# Patient Record
Sex: Female | Born: 1985 | Race: Black or African American | Hispanic: No | Marital: Single | Smoking: Current every day smoker
Health system: Southern US, Community
[De-identification: ages and names within clinical notes are randomized; demographics above are authoritative.]

## PROBLEM LIST (undated history)

## (undated) ENCOUNTER — Inpatient Hospital Stay (HOSPITAL_COMMUNITY): Payer: Self-pay

## (undated) DIAGNOSIS — D279 Benign neoplasm of unspecified ovary: Secondary | ICD-10-CM

## (undated) DIAGNOSIS — I10 Essential (primary) hypertension: Secondary | ICD-10-CM

## (undated) DIAGNOSIS — M546 Pain in thoracic spine: Secondary | ICD-10-CM

## (undated) DIAGNOSIS — J302 Other seasonal allergic rhinitis: Secondary | ICD-10-CM

## (undated) DIAGNOSIS — J45909 Unspecified asthma, uncomplicated: Secondary | ICD-10-CM

## (undated) DIAGNOSIS — E739 Lactose intolerance, unspecified: Secondary | ICD-10-CM

## (undated) DIAGNOSIS — R4586 Emotional lability: Secondary | ICD-10-CM

## (undated) DIAGNOSIS — L309 Dermatitis, unspecified: Secondary | ICD-10-CM

## (undated) DIAGNOSIS — R569 Unspecified convulsions: Secondary | ICD-10-CM

## (undated) DIAGNOSIS — I639 Cerebral infarction, unspecified: Secondary | ICD-10-CM

## (undated) HISTORY — DX: Pain in thoracic spine: M54.6

## (undated) HISTORY — PX: WISDOM TOOTH EXTRACTION: SHX21

## (undated) HISTORY — DX: Emotional lability: R45.86

## (undated) HISTORY — DX: Unspecified convulsions: R56.9

## (undated) HISTORY — DX: Benign neoplasm of unspecified ovary: D27.9

---
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
## 2012-04-07 ENCOUNTER — Encounter (HOSPITAL_COMMUNITY): Payer: Self-pay | Admitting: Emergency Medicine

## 2012-04-07 ENCOUNTER — Emergency Department (HOSPITAL_COMMUNITY)
Admission: EM | Admit: 2012-04-07 | Discharge: 2012-04-07 | Disposition: A | Discharge status: Home / Self Care | Payer: Self-pay | Attending: Emergency Medicine | Admitting: Emergency Medicine

## 2012-04-07 DIAGNOSIS — I10 Essential (primary) hypertension: Secondary | ICD-10-CM | POA: Insufficient documentation

## 2012-04-07 DIAGNOSIS — L309 Dermatitis, unspecified: Secondary | ICD-10-CM

## 2012-04-07 DIAGNOSIS — J309 Allergic rhinitis, unspecified: Secondary | ICD-10-CM | POA: Insufficient documentation

## 2012-04-07 DIAGNOSIS — J302 Other seasonal allergic rhinitis: Secondary | ICD-10-CM

## 2012-04-07 DIAGNOSIS — L259 Unspecified contact dermatitis, unspecified cause: Secondary | ICD-10-CM | POA: Insufficient documentation

## 2012-04-07 DIAGNOSIS — F17200 Nicotine dependence, unspecified, uncomplicated: Secondary | ICD-10-CM | POA: Insufficient documentation

## 2012-04-07 HISTORY — DX: Essential (primary) hypertension: I10

## 2012-04-07 HISTORY — DX: Dermatitis, unspecified: L30.9

## 2012-04-07 MED ORDER — OXYCODONE-ACETAMINOPHEN 5-325 MG PO TABS
1.0000 | ORAL_TABLET | Freq: Once | ORAL | Status: AC
  Administered 2012-04-07: 1 via ORAL
  Filled 2012-04-07: qty 1

## 2012-04-07 MED ORDER — OXYCODONE-ACETAMINOPHEN 5-325 MG PO TABS: 1.0000 | ORAL_TABLET | ORAL | Status: DC | PRN

## 2012-04-07 MED ORDER — PREDNISONE 20 MG PO TABS: 60.0000 mg | ORAL_TABLET | Freq: Every day | ORAL | Status: DC

## 2012-04-07 MED ORDER — PREDNISONE 20 MG PO TABS
60.0000 mg | ORAL_TABLET | Freq: Once | ORAL | Status: AC
  Administered 2012-04-07: 60 mg via ORAL
  Filled 2012-04-07: qty 3

## 2012-04-07 NOTE — ED Provider Notes (Signed)
History   This chart was scribed for Dione Booze, MD by Sofie Rower. The patient was seen in room TR04C/TR04C and the patient's care was started at 11:31AM.     CSN: [id number]  Arrival date & time 04/07/12  1018   First MD Initiated Contact with Patient 04/07/12 1131      Chief Complaint  Patient presents with  . Eczema    (Consider location/radiation/quality/duration/timing/severity/associated sxs/prior treatment) Patient is a 26 y.o. female presenting with rash. The history is provided by the patient. No language interpreter was used.  Rash  This is a new problem. The current episode started more than 2 days ago. The problem has been gradually worsening. The problem is associated with an unknown factor. There has been no fever. The rash is present on the trunk, face, right hand and left hand. The pain is moderate. The pain has been constant since onset. Associated symptoms include pain. She has tried nothing for the symptoms. The treatment provided no relief.    [name] [name] is a 26 y.o. female , with a hx of eczema, who presents to the Emergency Department complaining of sudden, progressively worsening, eczema, located at the hands bilaterally, face, and trunk, onset one week ago.  Associated symptoms include nonproductive cough. The pt reports the pain she is experiencing with the eczema is rated at a 7/10 at present. In addition, the pt informs that her previous episodes of eczema have been triggered by the change in the weather, transitioning from warm to cool temperatures.   The pt is a current everyday smoker (0.3 packs/day), in addition to drinking alcohol on occasion.   Pt does not have a PCP.    Past Medical History  Diagnosis Date  . Eczema   . Hypertension     History reviewed. No pertinent past surgical history.  No family history on file.  History  Substance Use Topics  . Smoking status: Current Every Day Smoker  . Smokeless tobacco: Not on file  . Alcohol  Use: Yes     occasional    OB History    Grav Para Term Preterm Abortions TAB SAB Ect Mult Living                  Review of Systems  Skin: Positive for rash.  All other systems reviewed and are negative.    Allergies  Sulfa antibiotics  Home Medications   Current Outpatient Rx  Name Route Sig Dispense Refill  . DIPHENHYDRAMINE HCL (SLEEP) 25 MG PO TABS Oral Take 25 mg by mouth every 4 (four) hours as needed. Itching, allergies    . IBUPROFEN 200 MG PO TABS Oral Take 200 mg by mouth every 6 (six) hours as needed. headache      BP 153/111  Pulse 72  Temp 98.7 F (37.1 C) (Oral)  Resp 18  SpO2 99%  LMP 03/15/2012  Physical Exam  Nursing note and vitals reviewed. Constitutional: She is oriented to person, place, and time. She appears well-developed and well-nourished.  HENT:  Head: Atraumatic.  Nose: Nose normal.  Eyes: Conjunctivae normal and EOM are normal.  Neck: Normal range of motion.  Cardiovascular: Normal rate, regular rhythm and normal heart sounds.   Pulmonary/Chest: Effort normal and breath sounds normal.  Abdominal: Soft. Bowel sounds are normal.  Musculoskeletal: Normal range of motion.  Neurological: She is alert and oriented to person, place, and time.  Skin: Skin is warm and dry.       Skin is  thickened around the face, hands, and trunk. Mild erythema and cracking of the skin detected.   Psychiatric: She has a normal mood and affect. Her behavior is normal.       ED Course  Procedures (including critical care time)  DIAGNOSTIC STUDIES: Oxygen Saturation is 99% on room air, normal by my interpretation.    COORDINATION OF CARE:   11:35 AM- Treatment plan concerning application of prednisone discussed with patient. Pt agrees with treatment.         1. Eczema   2. Seasonal allergies       MDM  Eczema exacerbation. Rashes on 2 large of an area to treat effectively with topical steroids. She will be given a burst of prednisone 60  mg a day for 5 days. She also has complaints of seasonal allergies for which she is advised to use over-the-counter second-generation antihistamines.      I personally performed the services described in this documentation, which was scribed in my presence. The recorded information has been reviewed and considered.      Dione Booze, MD 04/07/12 2025

## 2012-04-07 NOTE — ED Notes (Signed)
Pt c/o eczema and allergies x 1 week. Pt reports usually can use Vaseline for eczema, but not with this episode.

## 2012-06-19 NOTE — L&D Delivery Note (Signed)
Delivery Note At 12:05 PM a viable and healthy female was delivered via spontaneous vaginal delivery.  APGAR: 8, 9.   Placenta status: spontaneously delivered intact.  Cord: 3 vessel with the following complications: none.  Anesthesia: Epidural  Episiotomy: none Lacerations: 1st degree vaginal, repaired, and 1st degree L labial, hemostatic Suture Repair: 3.0 vicryl Est. Blood Loss (mL): 400  27 yo G1 now P1001 s/p NSVD s/p IOL for chronic hypertension. After delivery of infant, 3rd stage of labor was actively managed and placenta was delivered spontaneously intact. First degree vaginal tear was repaired by Caren Griffins, CNM. First degree L labial tear was hemostatatic and did not require repair. Sharps and sponges were counted and hemostasis was achieved prior to leaving the room.  Mom to postpartum.  Baby to Couplet care / Skin to Skin.  Pior, Jearld Lesch 05/27/2013, 12:40 PM  Evaluation and management procedures were performed by Resident physician under my supervision/collaboration. Chart reviewed, patient examined by me and I agree with management and plan. Present for second and third stage. Repaired 4 cm rt sulcus tear and left labial laceration. Good bonding.  Danae Orleans, CNM 05/27/2013 1:05 PM

## 2012-07-05 ENCOUNTER — Encounter (HOSPITAL_COMMUNITY): Payer: Self-pay | Admitting: Emergency Medicine

## 2012-07-05 ENCOUNTER — Inpatient Hospital Stay (HOSPITAL_COMMUNITY)
Admission: EM | Admit: 2012-07-05 | Discharge: 2012-07-09 | DRG: 872 | Disposition: A | Discharge status: Home / Self Care | Payer: Self-pay | Attending: Family Medicine | Admitting: Family Medicine

## 2012-07-05 ENCOUNTER — Emergency Department (HOSPITAL_COMMUNITY): Payer: Self-pay

## 2012-07-05 DIAGNOSIS — I10 Essential (primary) hypertension: Secondary | ICD-10-CM | POA: Diagnosis present

## 2012-07-05 DIAGNOSIS — L259 Unspecified contact dermatitis, unspecified cause: Secondary | ICD-10-CM | POA: Diagnosis present

## 2012-07-05 DIAGNOSIS — F17200 Nicotine dependence, unspecified, uncomplicated: Secondary | ICD-10-CM | POA: Diagnosis present

## 2012-07-05 DIAGNOSIS — N760 Acute vaginitis: Secondary | ICD-10-CM | POA: Diagnosis present

## 2012-07-05 DIAGNOSIS — E876 Hypokalemia: Secondary | ICD-10-CM | POA: Diagnosis present

## 2012-07-05 DIAGNOSIS — N10 Acute tubulo-interstitial nephritis: Secondary | ICD-10-CM

## 2012-07-05 DIAGNOSIS — N12 Tubulo-interstitial nephritis, not specified as acute or chronic: Secondary | ICD-10-CM

## 2012-07-05 DIAGNOSIS — A419 Sepsis, unspecified organism: Secondary | ICD-10-CM

## 2012-07-05 DIAGNOSIS — B9620 Unspecified Escherichia coli [E. coli] as the cause of diseases classified elsewhere: Secondary | ICD-10-CM | POA: Diagnosis present

## 2012-07-05 DIAGNOSIS — A4151 Sepsis due to Escherichia coli [E. coli]: Principal | ICD-10-CM | POA: Diagnosis present

## 2012-07-05 HISTORY — DX: Unspecified asthma, uncomplicated: J45.909

## 2012-07-05 LAB — COMPREHENSIVE METABOLIC PANEL
ALT: 22 U/L (ref 0–35)
Alkaline Phosphatase: 80 U/L (ref 39–117)
BUN: 6 mg/dL (ref 6–23)
CO2: 26 mEq/L (ref 19–32)
Chloride: 96 mEq/L (ref 96–112)
GFR calc Af Amer: >90 mL/min (ref >90)
GFR calc non Af Amer: >90 mL/min (ref >90)
Glucose, Bld: 79 mg/dL (ref 70–99)
Potassium: 2.9 mEq/L — ABNORMAL LOW (ref 3.5–5.1)
Sodium: 137 mEq/L (ref 135–145)
Total Bilirubin: 0.9 mg/dL (ref 0.3–1.2)
Total Protein: 8 g/dL (ref 6.0–8.3)

## 2012-07-05 LAB — CBC WITH DIFFERENTIAL/PLATELET
Eosinophils Absolute: 0 10*3/uL (ref 0.0–0.7)
Hemoglobin: 12.8 g/dL (ref 12.0–15.0)
Lymphocytes Relative: 17 % (ref 12–46)
Lymphs Abs: 1.3 10*3/uL (ref 0.7–4.0)
MCH: 31.8 pg (ref 26.0–34.0)
Monocytes Relative: 10 % (ref 3–12)
Neutro Abs: 5.5 10*3/uL (ref 1.7–7.7)
Neutrophils Relative %: 73 % (ref 43–77)
Platelets: 177 10*3/uL (ref 150–400)
RBC: 4.03 MIL/uL (ref 3.87–5.11)
WBC: 7.5 10*3/uL (ref 4.0–10.5)

## 2012-07-05 LAB — URINALYSIS, ROUTINE W REFLEX MICROSCOPIC
Ketones, ur: 15 mg/dL — AB
Nitrite: NEGATIVE
Protein, ur: 30 mg/dL — AB
pH: 6 (ref 5.0–8.0)

## 2012-07-05 LAB — URINE MICROSCOPIC-ADD ON

## 2012-07-05 LAB — WET PREP, GENITAL: Trich, Wet Prep: NONE SEEN

## 2012-07-05 MED ORDER — ONDANSETRON HCL 4 MG/2ML IJ SOLN
4.0000 mg | Freq: Once | INTRAMUSCULAR | Status: AC
  Administered 2012-07-05: 4 mg via INTRAVENOUS
  Filled 2012-07-05: qty 2

## 2012-07-05 MED ORDER — SODIUM CHLORIDE 0.9 % IV SOLN
INTRAVENOUS | Status: DC
  Administered 2012-07-05 – 2012-07-06 (×2): via INTRAVENOUS
  Administered 2012-07-06: 125 mL via INTRAVENOUS
  Administered 2012-07-07: 125 mL/h via INTRAVENOUS

## 2012-07-05 MED ORDER — NICOTINE 14 MG/24HR TD PT24
14.0000 mg | MEDICATED_PATCH | Freq: Every day | TRANSDERMAL | Status: DC
  Administered 2012-07-06 – 2012-07-09 (×4): 14 mg via TRANSDERMAL
  Filled 2012-07-05 (×4): qty 1

## 2012-07-05 MED ORDER — DEXTROSE 5 % IV SOLN
1.0000 g | Freq: Once | INTRAVENOUS | Status: AC
  Administered 2012-07-05: 1 g via INTRAVENOUS
  Filled 2012-07-05: qty 10

## 2012-07-05 MED ORDER — DEXTROSE 5 % IV SOLN
1.0000 g | INTRAVENOUS | Status: DC
  Administered 2012-07-06 – 2012-07-07 (×2): 1 g via INTRAVENOUS
  Filled 2012-07-05 (×3): qty 10

## 2012-07-05 MED ORDER — IOHEXOL 300 MG/ML  SOLN
100.0000 mL | Freq: Once | INTRAMUSCULAR | Status: AC | PRN
  Administered 2012-07-05: 100 mL via INTRAVENOUS

## 2012-07-05 MED ORDER — ACETAMINOPHEN 325 MG PO TABS
650.0000 mg | ORAL_TABLET | Freq: Once | ORAL | Status: AC
  Administered 2012-07-05: 650 mg via ORAL

## 2012-07-05 MED ORDER — ACETAMINOPHEN 325 MG PO TABS
650.0000 mg | ORAL_TABLET | Freq: Once | ORAL | Status: AC
  Administered 2012-07-05: 650 mg via ORAL
  Filled 2012-07-05: qty 2

## 2012-07-05 MED ORDER — POTASSIUM CHLORIDE CRYS ER 20 MEQ PO TBCR
40.0000 meq | EXTENDED_RELEASE_TABLET | Freq: Two times a day (BID) | ORAL | Status: DC
  Administered 2012-07-06: 40 meq via ORAL
  Filled 2012-07-05 (×3): qty 2

## 2012-07-05 MED ORDER — HYDROMORPHONE HCL PF 1 MG/ML IJ SOLN
1.0000 mg | Freq: Once | INTRAMUSCULAR | Status: AC
  Administered 2012-07-05: 1 mg via INTRAVENOUS
  Filled 2012-07-05: qty 1

## 2012-07-05 MED ORDER — HEPARIN SODIUM (PORCINE) 5000 UNIT/ML IJ SOLN
5000.0000 [IU] | Freq: Three times a day (TID) | INTRAMUSCULAR | Status: DC
  Administered 2012-07-06 (×3): 5000 [IU] via SUBCUTANEOUS
  Filled 2012-07-05 (×13): qty 1

## 2012-07-05 MED ORDER — MORPHINE SULFATE 2 MG/ML IJ SOLN
1.0000 mg | INTRAMUSCULAR | Status: DC | PRN
  Administered 2012-07-06 – 2012-07-07 (×3): 1 mg via INTRAVENOUS
  Filled 2012-07-05 (×3): qty 1

## 2012-07-05 MED ORDER — BISACODYL 5 MG PO TBEC: 5.0000 mg | DELAYED_RELEASE_TABLET | Freq: Every day | ORAL | Status: DC | PRN

## 2012-07-05 MED ORDER — IOHEXOL 300 MG/ML  SOLN
50.0000 mL | Freq: Once | INTRAMUSCULAR | Status: AC | PRN
  Administered 2012-07-05: 50 mL via ORAL

## 2012-07-05 MED ORDER — ACETAMINOPHEN 650 MG RE SUPP: 650.0000 mg | Freq: Four times a day (QID) | RECTAL | Status: DC | PRN

## 2012-07-05 MED ORDER — ONDANSETRON HCL 4 MG/2ML IJ SOLN
4.0000 mg | Freq: Four times a day (QID) | INTRAMUSCULAR | Status: DC | PRN
  Administered 2012-07-06 – 2012-07-09 (×4): 4 mg via INTRAVENOUS
  Filled 2012-07-05 (×4): qty 2

## 2012-07-05 MED ORDER — ACETAMINOPHEN 325 MG PO TABS
650.0000 mg | ORAL_TABLET | Freq: Four times a day (QID) | ORAL | Status: DC | PRN
  Administered 2012-07-06 – 2012-07-07 (×3): 650 mg via ORAL
  Filled 2012-07-05 (×3): qty 2

## 2012-07-05 MED ORDER — ONDANSETRON HCL 4 MG PO TABS: 4.0000 mg | ORAL_TABLET | Freq: Four times a day (QID) | ORAL | Status: DC | PRN

## 2012-07-05 MED ORDER — OXYCODONE HCL 5 MG PO TABS
5.0000 mg | ORAL_TABLET | ORAL | Status: DC | PRN
  Administered 2012-07-06 – 2012-07-09 (×13): 5 mg via ORAL
  Filled 2012-07-05 (×7): qty 1
  Filled 2012-07-05: qty 2
  Filled 2012-07-05 (×5): qty 1

## 2012-07-05 NOTE — ED Notes (Signed)
Pt c/o lower abd and back pain x several days with nausea and vomiting this am; pt with fever at present; pt sts LMP was 05/29/12

## 2012-07-05 NOTE — ED Provider Notes (Signed)
History  This chart was scribed for [name] Jakes, MD by Bennett Scrape, ED Scribe. This patient was seen in room CD10C/CD10C and the patient's care was started at 2:06 PM.  CSN: [id number]  Arrival date & time 07/05/12  1133   First MD Initiated Contact with Patient 07/05/12 1406      Chief Complaint  Patient presents with  . Abdominal Pain  . Fever  . Back Pain     Patient is a 27 y.o. female presenting with abdominal pain. The history is provided by the patient. No language interpreter was used.  Abdominal Pain The primary symptoms of the illness include abdominal pain, fever, shortness of breath, nausea and vomiting. The primary symptoms of the illness do not include diarrhea, dysuria, vaginal discharge or vaginal bleeding. The current episode started more than 2 days ago. The onset of the illness was gradual. The problem has been gradually worsening.  The abdominal pain is located in the RUQ. The abdominal pain radiates to the RLQ, back and chest. The severity of the abdominal pain is 6/10. The abdominal pain is relieved by nothing.  Additional symptoms associated with the illness include chills and back pain. Symptoms associated with the illness do not include hematuria. Significant associated medical issues do not include diabetes or gallstones.    [name] [name] is a 27 y.o. female who presents to the Emergency Department complaining of one week of gradual onset, gradually worsening, constant lower RUQ abdominal pain that radiates to the back, mid chest and RLQ with associated 2 weeks of decreased appetite, SOB, sweats, nausea, and emesis that started this morning. Pt denies having any known fever but fever in the ED is 102.4. She rates her pain a 6 out of 10 currently. She denies taking OTC medications at home to improve symptoms. She denies having prior episodes of similar symptoms.Her LNMP was 05/29/12. She reports neck pain that she attributes to sleeping "wrong" and  chronic nasal congestion attributed to allergies but denies diarrhea, visual changes, vaginal bleeding or discharge, as associated symptoms. She has a h/o HTN and is a current everyday smoker and occasional alcohol user.  No PCP.  Past Medical History  Diagnosis Date  . Eczema   . Hypertension     History reviewed. No pertinent past surgical history.  History reviewed. No pertinent family history.  History  Substance Use Topics  . Smoking status: Current Every Day Smoker  . Smokeless tobacco: Not on file  . Alcohol Use: Yes     Comment: occasional   No OB history provided.  Review of Systems  Constitutional: Positive for fever, chills and appetite change.  HENT: Positive for congestion and neck pain. Negative for sore throat.   Eyes: Negative for visual disturbance.  Respiratory: Positive for shortness of breath. Negative for cough.   Cardiovascular: Positive for chest pain.  Gastrointestinal: Positive for nausea, vomiting and abdominal pain. Negative for diarrhea.  Genitourinary: Negative for dysuria, hematuria, vaginal bleeding and vaginal discharge.  Musculoskeletal: Positive for back pain.  Skin: Positive for rash.  Neurological: Negative for headaches.  Hematological: Does not bruise/bleed easily.  All other systems reviewed and are negative.    Allergies  Shellfish allergy and Sulfa antibiotics  Home Medications   Current Outpatient Rx  Name  Route  Sig  Dispense  Refill  . ASPIRIN 325 MG PO TABS   Oral   Take 650 mg by mouth once. For pain.         . IBUPROFEN  200 MG PO TABS   Oral   Take 400 mg by mouth every 6 (six) hours as needed. For pain.           Triage Vitals: BP 121/97  Pulse 88  Temp 99.6 F (37.6 C) (Oral)  Resp 20  SpO2 99%  Physical Exam  Nursing note and vitals reviewed. Constitutional: She is oriented to person, place, and time. She appears well-developed and well-nourished. No distress.  HENT:  Head: Normocephalic and  atraumatic.       Moist MM  Eyes: Conjunctivae normal and EOM are normal. Pupils are equal, round, and reactive to light. Scleral icterus (mild) is present.  Neck: Neck supple. No tracheal deviation present.  Cardiovascular: Normal rate and regular rhythm.   No murmur heard. Pulmonary/Chest: Effort normal and breath sounds normal. No respiratory distress.  Abdominal: Soft. Bowel sounds are normal. There is tenderness (right-sided tenderness). There is no guarding.       No left-sided tenderness  Musculoskeletal: Normal range of motion. She exhibits no edema.  Lymphadenopathy:    She has no cervical adenopathy.  Neurological: She is alert and oriented to person, place, and time. No cranial nerve deficit.       Pt able to move both sets of fingers and toes  Skin: Skin is warm and dry. Rash (eczema) noted.  Psychiatric: She has a normal mood and affect. Her behavior is normal.    ED Course  Procedures (including critical care time)  DIAGNOSTIC STUDIES: Oxygen Saturation is 99% on room air, normal by my interpretation.    COORDINATION OF CARE: 3:25 PM-Discussed treatment plan which includes IV fluids, pain medication and CT of abdomen with pt at bedside and pt agreed to plan.   3:45 PM- Ordered 1 mg Dilaudid and 4 mg Zofran Labs Reviewed  COMPREHENSIVE METABOLIC PANEL - Abnormal; Notable for the following:    Potassium 2.9 (*)     All other components within normal limits  URINALYSIS, ROUTINE W REFLEX MICROSCOPIC - Abnormal; Notable for the following:    APPearance CLOUDY (*)     Hgb urine dipstick MODERATE (*)     Bilirubin Urine SMALL (*)     Ketones, ur 15 (*)     Protein, ur 30 (*)     Leukocytes, UA LARGE (*)     All other components within normal limits  URINE MICROSCOPIC-ADD ON - Abnormal; Notable for the following:    Squamous Epithelial / LPF MANY (*)     Bacteria, UA MANY (*)     All other components within normal limits  CBC WITH DIFFERENTIAL  POCT PREGNANCY, URINE   LIPASE, BLOOD  URINE CULTURE   Ct Abdomen Pelvis W Contrast  07/05/2012  *RADIOLOGY REPORT*  Clinical Data: Abdominal pain, fever and back pain.  CT ABDOMEN AND PELVIS WITH CONTRAST  Technique:  Multidetector CT imaging of the abdomen and pelvis was performed following the standard protocol during bolus administration of intravenous contrast.  Contrast: OMNIPAQUE IOHEXOL 300 MG/ML  SOLN  Comparison: None.  Findings: The lung bases are clear.  The heart is normal.  No pericardial effusion.  The distal esophagus is unremarkable.  The liver is unremarkable.  No focal hepatic lesions or intrahepatic biliary dilatation.  The gallbladder is normal.  No common bile duct dilatation.  The pancreas is normal.  The spleen is normal in size.  No focal lesions.  The adrenal glands are normal.  The right kidney demonstrates patchy areas of decreased  perfusion.  Findings suggest pyelonephritis.  No renal abscess, hydronephrosis or renal calculi.  No obstructing ureteral calculi.  The stomach, duodenum, small bowel and colon are grossly normal. The appendix is visualized and is normal.  There is a small complex cystic lesion containing some fat associated the right ovary consistent with a small ovarian dermoid.  The left ovary is normal. The uterus is retroverted.  A small amount of free pelvic fluid is noted.  The bladder is unremarkable.  No pelvic mass or adenopathy. No inguinal mass or hernia.  The aorta is normal in caliber.  The major branch vessels are normal.  No mesenteric or retroperitoneal mass or adenopathy.  The bony structures are unremarkable.  IMPRESSION:  1.  CT findings consistent with right-sided pyelonephritis.  No renal abscess. 2.  Normal appendix. 3.  Small right-sided ovarian dermoid.   Original Report Authenticated By: Rudie Meyer, M.D.    Results for orders placed during the hospital encounter of 07/05/12  CBC WITH DIFFERENTIAL      Component Value Range   WBC 7.5  4.0 - 10.5 K/uL   RBC  4.03  3.87 - 5.11 MIL/uL   Hemoglobin 12.8  12.0 - 15.0 g/dL   HCT 40.9  81.1 - 91.4 %   MCV 92.1  78.0 - 100.0 fL   MCH 31.8  26.0 - 34.0 pg   MCHC 34.5  30.0 - 36.0 g/dL   RDW 78.2  95.6 - 21.3 %   Platelets 177  150 - 400 K/uL   Neutrophils Relative 73  43 - 77 %   Neutro Abs 5.5  1.7 - 7.7 K/uL   Lymphocytes Relative 17  12 - 46 %   Lymphs Abs 1.3  0.7 - 4.0 K/uL   Monocytes Relative 10  3 - 12 %   Monocytes Absolute 0.7  0.1 - 1.0 K/uL   Eosinophils Relative 0  0 - 5 %   Eosinophils Absolute 0.0  0.0 - 0.7 K/uL   Basophils Relative 0  0 - 1 %   Basophils Absolute 0.0  0.0 - 0.1 K/uL  COMPREHENSIVE METABOLIC PANEL      Component Value Range   Sodium 137  135 - 145 mEq/L   Potassium 2.9 (*) 3.5 - 5.1 mEq/L   Chloride 96  96 - 112 mEq/L   CO2 26  19 - 32 mEq/L   Glucose, Bld 79  70 - 99 mg/dL   BUN 6  6 - 23 mg/dL   Creatinine, Ser 0.86  0.50 - 1.10 mg/dL   Calcium 9.4  8.4 - 57.8 mg/dL   Total Protein 8.0  6.0 - 8.3 g/dL   Albumin 3.7  3.5 - 5.2 g/dL   AST 23  0 - 37 U/L   ALT 22  0 - 35 U/L   Alkaline Phosphatase 80  39 - 117 U/L   Total Bilirubin 0.9  0.3 - 1.2 mg/dL   GFR calc non Af Amer >90  >90 mL/min   GFR calc Af Amer >90  >90 mL/min  URINALYSIS, ROUTINE W REFLEX MICROSCOPIC      Component Value Range   Color, Urine YELLOW  YELLOW   APPearance CLOUDY (*) CLEAR   Specific Gravity, Urine 1.012  1.005 - 1.030   pH 6.0  5.0 - 8.0   Glucose, UA NEGATIVE  NEGATIVE mg/dL   Hgb urine dipstick MODERATE (*) NEGATIVE   Bilirubin Urine SMALL (*) NEGATIVE   Ketones, ur 15 (*)  NEGATIVE mg/dL   Protein, ur 30 (*) NEGATIVE mg/dL   Urobilinogen, UA 1.0  0.0 - 1.0 mg/dL   Nitrite NEGATIVE  NEGATIVE   Leukocytes, UA LARGE (*) NEGATIVE  POCT PREGNANCY, URINE      Component Value Range   Preg Test, Ur NEGATIVE  NEGATIVE  LIPASE, BLOOD      Component Value Range   Lipase 15  11 - 59 U/L  URINE MICROSCOPIC-ADD ON      Component Value Range   Squamous Epithelial / LPF  MANY (*) RARE   WBC, UA TOO NUMEROUS TO COUNT  <3 WBC/hpf   RBC / HPF 0-2  <3 RBC/hpf   Bacteria, UA MANY (*) RARE     1. Pyelonephritis       MDM   Patient workup consistent with right panel nephritis. Patient said symptoms for about a week came in with a temperature of 102 nausea and vomiting improved in ED initially with IV fluids pain medicine and time medics 1 g of Rocephin was given. The patient started to feel very bad again temp up to 104 even after Tylenol and came a little tachycardic and was getting nauseated again. Patient will require admission. Patient's unassigned will be admitted by family medicine they will be down to see her.      I personally performed the services described in this documentation, which was scribed in my presence. The recorded information has been reviewed and is accurate.     [name] Jakes, MD 07/05/12 2029

## 2012-07-05 NOTE — H&P (Signed)
Family Medicine Teaching [facility] Admission History and Physical  Patient name: [name] [name] Medical record number: [id number] Date of birth: 05/31/1986 Age: 27 y.o. Gender: female  Primary Care Provider: Default, Provider, MD  Chief Complaint: Abdominal Pain  History of Present Illness: [name] [name] is a 27 y.o. year old female presenting with abdominal pain, fever, and nausea. The abdominal pain was primarily right sided, but intermittently bilateral. It started approximately 1 week ago and was severe from Monday through Wednesday of this week. It was exacerbated by movement. It improved for one day, and then recurred with severity on Friday and was accompanied by subjective fever, nausea, and vomiting. The patient was initially concerned that she may pregnant. She had unprotected sex approximately 3 weeks ago and since that time missed her regular period that was supposed to start this week. She notes white vaginal discharged. She denies known exposure to STI;s. She also denies dysuria, polyuria, or a history of urinary tract infection.   In the ED, the patient was evaluated with a CT abdomen that showed inflammation of her right kidney. Her urinalysis showed evidence of infection, so she was treated with ceftriaxone 1 g for presumed pyelonephritis. She was also febrile to 104.1, treated with antipyretics and had trouble tolerating PO after zofran. She was also given  Hydromorphone 1 g x 2 and states that her abdominal pain is currently 6/10. Her nausea is improved, but still feels ill.    There is no problem list on file for this patient.  Past Medical History: Past Medical History  Diagnosis Date  . Eczema   . Hypertension     Past Surgical History: History reviewed. No pertinent past surgical history.  Social History: History   Social History  . Marital Status: Single    Spouse Name: N/A    Number of Children: N/A  . Years of Education: N/A   Social History Main  Topics  . Smoking status: Current Every Day Smoker  . Smokeless tobacco: None  . Alcohol Use: Yes     Comment: occasional  . Drug Use: No  . Sexually Active:    Other Topics Concern  . None   Social History Narrative  . None    Family History: History reviewed. No pertinent family history.  Allergies: Allergies  Allergen Reactions  . Shellfish Allergy Other (See Comments)  . Sulfa Antibiotics Swelling    Current Facility-Administered Medications  Medication Dose Route Frequency Provider Last Rate Last Dose  . 0.9 %  sodium chloride infusion   Intravenous Continuous Shelda Jakes, MD 125 mL/hr at 07/05/12 1535     Current Outpatient Prescriptions  Medication Sig Dispense Refill  . aspirin 325 MG tablet Take 650 mg by mouth once. For pain.      Marland Kitchen ibuprofen (ADVIL,MOTRIN) 200 MG tablet Take 400 mg by mouth every 6 (six) hours as needed. For pain.       Review Of Systems: Per HPI with the following additions: none Otherwise 12 point review of systems was performed and was unremarkable.  Physical Exam: Temp:  [99.3 F (37.4 C)-104.4 F (40.2 C)] 99.3 F (37.4 C) (01/17 2212) Pulse Rate:  [88-101] 91  (01/17 2212) Resp:  [16-20] 16  (01/17 2212) BP: (121-148)/(78-97) 129/78 mmHg (01/17 2212) SpO2:  [97 %-100 %] 97 % (01/17 2212)   General: AAF, illl appearing , pleasant and conversant  HEENT: PERRLA, extra ocular movement intact, sclera clear, anicteric and oropharynx clear, no lesions Heart: S1, S2 normal,  no murmur, rub or gallop, regular rate and rhythm Lungs: clear to auscultation, no wheezes or rales and unlabored breathing Abdomen: soft, mild TTP in all quadrants, non distended, NABS GU: External: no lesions Vagina: no blood in vault Cervix: thick white discharge  Uterus: small, mobile Adnexa: no masses; non tender  Extremities: extremities normal, atraumatic, no cyanosis or edema Skin:very warm and damp Neurology: normal without focal findings,  mental status, speech normal, alert and oriented x3 and PERLA  Labs and Imaging:  Results for orders placed during the hospital encounter of 07/05/12 (from the past 24 hour(s))  CBC WITH DIFFERENTIAL     Status: Normal   Collection Time   07/05/12 12:33 PM      Component Value Range   WBC 7.5  4.0 - 10.5 K/uL   RBC 4.03  3.87 - 5.11 MIL/uL   Hemoglobin 12.8  12.0 - 15.0 g/dL   HCT 45.4  09.8 - 11.9 %   MCV 92.1  78.0 - 100.0 fL   MCH 31.8  26.0 - 34.0 pg   MCHC 34.5  30.0 - 36.0 g/dL   RDW 14.7  82.9 - 56.2 %   Platelets 177  150 - 400 K/uL   Neutrophils Relative 73  43 - 77 %   Neutro Abs 5.5  1.7 - 7.7 K/uL   Lymphocytes Relative 17  12 - 46 %   Lymphs Abs 1.3  0.7 - 4.0 K/uL   Monocytes Relative 10  3 - 12 %   Monocytes Absolute 0.7  0.1 - 1.0 K/uL   Eosinophils Relative 0  0 - 5 %   Eosinophils Absolute 0.0  0.0 - 0.7 K/uL   Basophils Relative 0  0 - 1 %   Basophils Absolute 0.0  0.0 - 0.1 K/uL  COMPREHENSIVE METABOLIC PANEL     Status: Abnormal   Collection Time   07/05/12 12:33 PM      Component Value Range   Sodium 137  135 - 145 mEq/L   Potassium 2.9 (*) 3.5 - 5.1 mEq/L   Chloride 96  96 - 112 mEq/L   CO2 26  19 - 32 mEq/L   Glucose, Bld 79  70 - 99 mg/dL   BUN 6  6 - 23 mg/dL   Creatinine, Ser 1.30  0.50 - 1.10 mg/dL   Calcium 9.4  8.4 - 86.5 mg/dL   Total Protein 8.0  6.0 - 8.3 g/dL   Albumin 3.7  3.5 - 5.2 g/dL   AST 23  0 - 37 U/L   ALT 22  0 - 35 U/L   Alkaline Phosphatase 80  39 - 117 U/L   Total Bilirubin 0.9  0.3 - 1.2 mg/dL   GFR calc non Af Amer >90  >90 mL/min   GFR calc Af Amer >90  >90 mL/min  URINALYSIS, ROUTINE W REFLEX MICROSCOPIC     Status: Abnormal   Collection Time   07/05/12  1:53 PM      Component Value Range   Color, Urine YELLOW  YELLOW   APPearance CLOUDY (*) CLEAR   Specific Gravity, Urine 1.012  1.005 - 1.030   pH 6.0  5.0 - 8.0   Glucose, UA NEGATIVE  NEGATIVE mg/dL   Hgb urine dipstick MODERATE (*) NEGATIVE   Bilirubin Urine  SMALL (*) NEGATIVE   Ketones, ur 15 (*) NEGATIVE mg/dL   Protein, ur 30 (*) NEGATIVE mg/dL   Urobilinogen, UA 1.0  0.0 - 1.0 mg/dL  Nitrite NEGATIVE  NEGATIVE   Leukocytes, UA LARGE (*) NEGATIVE  URINE MICROSCOPIC-ADD ON     Status: Abnormal   Collection Time   07/05/12  1:53 PM      Component Value Range   Squamous Epithelial / LPF MANY (*) RARE   WBC, UA TOO NUMEROUS TO COUNT  <3 WBC/hpf   RBC / HPF 0-2  <3 RBC/hpf   Bacteria, UA MANY (*) RARE  POCT PREGNANCY, URINE     Status: Normal   Collection Time   07/05/12  2:06 PM      Component Value Range   Preg Test, Ur NEGATIVE  NEGATIVE  LIPASE, BLOOD     Status: Normal   Collection Time   07/05/12  3:32 PM      Component Value Range   Lipase 15  11 - 59 U/L  WET PREP, GENITAL     Status: Abnormal   Collection Time   07/05/12  9:45 PM      Component Value Range   Yeast Wet Prep HPF POC NONE SEEN  NONE SEEN   Trich, Wet Prep NONE SEEN  NONE SEEN   Clue Cells Wet Prep HPF POC MODERATE (*) NONE SEEN   WBC, Wet Prep HPF POC FEW (*) NONE SEEN    Ct Abdomen Pelvis W Contrast  07/05/2012  *RADIOLOGY REPORT*  Clinical Data: Abdominal pain, fever and back pain.  CT ABDOMEN AND PELVIS WITH CONTRAST  Technique:  Multidetector CT imaging of the abdomen and pelvis was performed following the standard protocol during bolus administration of intravenous contrast.  Contrast: OMNIPAQUE IOHEXOL 300 MG/ML  SOLN  Comparison: None.  Findings: The lung bases are clear.  The heart is normal.  No pericardial effusion.  The distal esophagus is unremarkable.  The liver is unremarkable.  No focal hepatic lesions or intrahepatic biliary dilatation.  The gallbladder is normal.  No common bile duct dilatation.  The pancreas is normal.  The spleen is normal in size.  No focal lesions.  The adrenal glands are normal.  The right kidney demonstrates patchy areas of decreased perfusion.  Findings suggest pyelonephritis.  No renal abscess, hydronephrosis or renal  calculi.  No obstructing ureteral calculi.  The stomach, duodenum, small bowel and colon are grossly normal. The appendix is visualized and is normal.  There is a small complex cystic lesion containing some fat associated the right ovary consistent with a small ovarian dermoid.  The left ovary is normal. The uterus is retroverted.  A small amount of free pelvic fluid is noted.  The bladder is unremarkable.  No pelvic mass or adenopathy. No inguinal mass or hernia.  The aorta is normal in caliber.  The major branch vessels are normal.  No mesenteric or retroperitoneal mass or adenopathy.  The bony structures are unremarkable.  IMPRESSION:  1.  CT findings consistent with right-sided pyelonephritis.  No renal abscess. 2.  Normal appendix. 3.  Small right-sided ovarian dermoid.   Original Report Authenticated By: Rudie Meyer, M.D.       Assessment and Plan: Necha Harries is a 27 y.o. year old female with sepsis (tachycardia, fever) due to right sided pyelonephritis with possible sexually transmitted infection.   # Pyelonephritis w/ Sepsis - Likely due to GNR, s/p CTX 1 g - Cont CTX 1 g q 24  - F/u culture and sensitivities  - Cont intense hydration  # Vag Discharge - Broad differential, but no physical exam evidence of PID; CTX would cover  gonorrhea - F/u wet prep and GC/CT prob  #Abdominal Pain/Nausea - Likely 2/2 pyelo, but also with dermoid cyst noted on CT  - Morphine PNR - Zofran PRN   # Tobacco Abuse - 1 ppd x 6 years  - Nicotine patch  # Hypokalemia - 2.9 at admission - 40 meq PO x 3, recheck in AM  FENGI - NS @ 125 ml/hr; allow PO as tolerated; K+ 2.9 @ admission, replete and recheck PPX - Heparin SQ DISPO - Admit to [facility] Medicine Teaching Service   Si Raider. Clinton Sawyer, MD, MBA 07/05/2012, 11:08 PM Family Medicine Resident, PGY-2 [phone] pager

## 2012-07-06 ENCOUNTER — Encounter (HOSPITAL_COMMUNITY): Payer: Self-pay | Admitting: Orthopedic Surgery

## 2012-07-06 LAB — BASIC METABOLIC PANEL
CO2: 23 mEq/L (ref 19–32)
Calcium: 8.6 mg/dL (ref 8.4–10.5)
GFR calc Af Amer: >90 mL/min (ref >90)
GFR calc non Af Amer: >90 mL/min (ref >90)
Glucose, Bld: 108 mg/dL — ABNORMAL HIGH (ref 70–99)
Potassium: 3 mEq/L — ABNORMAL LOW (ref 3.5–5.1)
Sodium: 135 mEq/L (ref 135–145)

## 2012-07-06 LAB — CBC
HCT: 33.7 % — ABNORMAL LOW (ref 36.0–46.0)
MCHC: 35 g/dL (ref 30.0–36.0)
MCV: 91.6 fL (ref 78.0–100.0)
RDW: 13.3 % (ref 11.5–15.5)

## 2012-07-06 MED ORDER — INFLUENZA VIRUS VACC SPLIT PF IM SUSP
0.5000 mL | INTRAMUSCULAR | Status: AC
  Filled 2012-07-06: qty 0.5

## 2012-07-06 MED ORDER — POTASSIUM CHLORIDE CRYS ER 20 MEQ PO TBCR
40.0000 meq | EXTENDED_RELEASE_TABLET | Freq: Two times a day (BID) | ORAL | Status: DC
  Administered 2012-07-06 – 2012-07-07 (×4): 40 meq via ORAL
  Filled 2012-07-06 (×4): qty 2

## 2012-07-06 NOTE — H&P (Signed)
I have seen and examined this patient. I have discussed with Dr Clinton Sawyer.  I agree with their findings and plans as documented in their admission note.  Acute Issues 1. Acute Pyelonephritis - No prior history of pyelonephritis - Poor tolerance of oral precludes oral antibiotic therapy. - Continue IV antibiotic pending resolution of fever and assured ability to tolerate per oral intake.  - Analgesia as needed. Antiemetics as needed.

## 2012-07-06 NOTE — Progress Notes (Signed)
I have seen and examined this patient. I have discussed with Dr Oh Park.  I agree with their findings and plans as documented in their progress note.    

## 2012-07-06 NOTE — Progress Notes (Signed)
Subjective: Her abdominal pain is better, and she denies nausea and vomiting. She ate some of her breakfast.   Objective: Vital signs in last 24 hours: Temp:  [98.6 F (37 C)-104.4 F (40.2 C)] 102.8 F (39.3 C) (01/18 0641) Pulse Rate:  [88-101] 101  (01/18 0641) Resp:  [16-20] 18  (01/18 0641) BP: (121-148)/(78-97) 145/91 mmHg (01/18 0641) SpO2:  [97 %-100 %] 99 % (01/18 0641) Weight:  [195 lb (88.451 kg)] 195 lb (88.451 kg) (01/18 0244)  Physical exam: GEN: NAD; well-nourished, -appearing  PSYCH: pleasant, appropriate to questions CV: RRR PULM: NI WOB; CTAB without wheezes or rales ABD: NABS, soft, mild tenderness RUQ NEURO: moves all extremities well; no focal deficits  Labs: Results for orders placed during the hospital encounter of 07/05/12 (from the past 24 hour(s))  CBC WITH DIFFERENTIAL     Status: Normal   Collection Time   07/05/12 12:33 PM      Component Value Range   WBC 7.5  4.0 - 10.5 K/uL   RBC 4.03  3.87 - 5.11 MIL/uL   Hemoglobin 12.8  12.0 - 15.0 g/dL   HCT 40.9  81.1 - 91.4 %   MCV 92.1  78.0 - 100.0 fL   MCH 31.8  26.0 - 34.0 pg   MCHC 34.5  30.0 - 36.0 g/dL   RDW 78.2  95.6 - 21.3 %   Platelets 177  150 - 400 K/uL   Neutrophils Relative 73  43 - 77 %   Neutro Abs 5.5  1.7 - 7.7 K/uL   Lymphocytes Relative 17  12 - 46 %   Lymphs Abs 1.3  0.7 - 4.0 K/uL   Monocytes Relative 10  3 - 12 %   Monocytes Absolute 0.7  0.1 - 1.0 K/uL   Eosinophils Relative 0  0 - 5 %   Eosinophils Absolute 0.0  0.0 - 0.7 K/uL   Basophils Relative 0  0 - 1 %   Basophils Absolute 0.0  0.0 - 0.1 K/uL  COMPREHENSIVE METABOLIC PANEL     Status: Abnormal   Collection Time   07/05/12 12:33 PM      Component Value Range   Sodium 137  135 - 145 mEq/L   Potassium 2.9 (*) 3.5 - 5.1 mEq/L   Chloride 96  96 - 112 mEq/L   CO2 26  19 - 32 mEq/L   Glucose, Bld 79  70 - 99 mg/dL   BUN 6  6 - 23 mg/dL   Creatinine, Ser 0.86  0.50 - 1.10 mg/dL   Calcium 9.4  8.4 - 57.8 mg/dL   Total Protein 8.0  6.0 - 8.3 g/dL   Albumin 3.7  3.5 - 5.2 g/dL   AST 23  0 - 37 U/L   ALT 22  0 - 35 U/L   Alkaline Phosphatase 80  39 - 117 U/L   Total Bilirubin 0.9  0.3 - 1.2 mg/dL   GFR calc non Af Amer >90  >90 mL/min   GFR calc Af Amer >90  >90 mL/min  URINALYSIS, ROUTINE W REFLEX MICROSCOPIC     Status: Abnormal   Collection Time   07/05/12  1:53 PM      Component Value Range   Color, Urine YELLOW  YELLOW   APPearance CLOUDY (*) CLEAR   Specific Gravity, Urine 1.012  1.005 - 1.030   pH 6.0  5.0 - 8.0   Glucose, UA NEGATIVE  NEGATIVE mg/dL   Hgb urine dipstick MODERATE (*)  NEGATIVE   Bilirubin Urine SMALL (*) NEGATIVE   Ketones, ur 15 (*) NEGATIVE mg/dL   Protein, ur 30 (*) NEGATIVE mg/dL   Urobilinogen, UA 1.0  0.0 - 1.0 mg/dL   Nitrite NEGATIVE  NEGATIVE   Leukocytes, UA LARGE (*) NEGATIVE  URINE MICROSCOPIC-ADD ON     Status: Abnormal   Collection Time   07/05/12  1:53 PM      Component Value Range   Squamous Epithelial / LPF MANY (*) RARE   WBC, UA TOO NUMEROUS TO COUNT  <3 WBC/hpf   RBC / HPF 0-2  <3 RBC/hpf   Bacteria, UA MANY (*) RARE  POCT PREGNANCY, URINE     Status: Normal   Collection Time   07/05/12  2:06 PM      Component Value Range   Preg Test, Ur NEGATIVE  NEGATIVE  LIPASE, BLOOD     Status: Normal   Collection Time   07/05/12  3:32 PM      Component Value Range   Lipase 15  11 - 59 U/L  WET PREP, GENITAL     Status: Abnormal   Collection Time   07/05/12  9:45 PM      Component Value Range   Yeast Wet Prep HPF POC NONE SEEN  NONE SEEN   Trich, Wet Prep NONE SEEN  NONE SEEN   Clue Cells Wet Prep HPF POC MODERATE (*) NONE SEEN   WBC, Wet Prep HPF POC FEW (*) NONE SEEN  BASIC METABOLIC PANEL     Status: Abnormal   Collection Time   07/06/12  6:46 AM      Component Value Range   Sodium 135  135 - 145 mEq/L   Potassium 3.0 (*) 3.5 - 5.1 mEq/L   Chloride 99  96 - 112 mEq/L   CO2 23  19 - 32 mEq/L   Glucose, Bld 108 (*) 70 - 99 mg/dL   BUN 5 (*) 6  - 23 mg/dL   Creatinine, Ser 1.61  0.50 - 1.10 mg/dL   Calcium 8.6  8.4 - 09.6 mg/dL   GFR calc non Af Amer >90  >90 mL/min   GFR calc Af Amer >90  >90 mL/min  CBC     Status: Abnormal   Collection Time   07/06/12  6:46 AM      Component Value Range   WBC 9.0  4.0 - 10.5 K/uL   RBC 3.68 (*) 3.87 - 5.11 MIL/uL   Hemoglobin 11.8 (*) 12.0 - 15.0 g/dL   HCT 04.5 (*) 40.9 - 81.1 %   MCV 91.6  78.0 - 100.0 fL   MCH 32.1  26.0 - 34.0 pg   MCHC 35.0  30.0 - 36.0 g/dL   RDW 91.4  78.2 - 95.6 %   Platelets 163  150 - 400 K/uL    Studies/Results: Ct Abdomen Pelvis W Contrast 07/05/2012  IMPRESSION:  1.  CT findings consistent with right-sided pyelonephritis.  No renal abscess. 2.  Normal appendix. 3.  Small right-sided ovarian dermoid.     Scheduled Meds:   . cefTRIAXone (ROCEPHIN)  IV  1 g Intravenous Q24H  . heparin  5,000 Units Subcutaneous Q8H  . influenza  inactive virus vaccine  0.5 mL Intramuscular Tomorrow-1000  . nicotine  14 mg Transdermal Daily  . potassium chloride  40 mEq Oral BID   Continuous Infusions:   . sodium chloride 125 mL/hr at 07/06/12 0433   PRN Meds:acetaminophen, acetaminophen, bisacodyl, morphine injection, ondansetron (  ZOFRAN) IV, ondansetron, oxyCODONE Oxycodone x 2  Zofran x 0   Assessment/Plan: [name] [name] is a 27 y.o. year old female who presented with abdominal pain and admitted for with sepsis (tachycardia, fever) due to right sided pyelonephritis with possible sexually transmitted infection.   # Abdominal pain, nausea/vomiting, fevers. Improved, decreasing WBC; persistent fevers.  Likely 2/2 pyelonephritis but dermoid cyst noted on CT. -Monitor symptoms  -See below regarding management of problems # Pyelonephritis with sepsis. She continues to have fever (last 102 6:30 AM).  She received first antibiotic dose at 1800 01/17.  -Cont CTX 1 g q 24. Consider adding Zosyn to cover for enteroccus if she worsens clinically and continues to have  fevers.  -Follow-up culture and sensitivities  # Vag Discharge. Broad differential, but no physical exam evidence of PID. Wet prep showing BV.  -Hold metronidazole treatment due to concern for nausea that may complicate clinical picture with her still having fevers.  -CTX would cover Gonorrhea  -Follow-up GC/Chlamydia probe    PSYCH # Tobacco Abuse. 1 ppd x 6 years. - Nicotine patch   FEN # Hypokalemia.  2.9 at admission. 3.0 after 40 mEq PO x 3, but she has also been getting MIVF without K.  -Repeat supplementation and add Mg to AML. -Add K to IVF # IVF: NS @ 125>>>add K  GI # Diet: regular  PPX # DVT PPx. Heparin SQ   DISPO: pending clinical improvement, resolution of fevers, and work-up. Potential discharge tomorrow.     LOS: 1 day   OH PARK, Erric Machnik

## 2012-07-07 LAB — CBC
MCH: 31.4 pg (ref 26.0–34.0)
MCV: 91.4 fL (ref 78.0–100.0)
Platelets: 160 10*3/uL (ref 150–400)
RDW: 13.2 % (ref 11.5–15.5)
WBC: 6.5 10*3/uL (ref 4.0–10.5)

## 2012-07-07 LAB — GC/CHLAMYDIA PROBE AMP
CT Probe RNA: NEGATIVE
GC Probe RNA: NEGATIVE

## 2012-07-07 LAB — BASIC METABOLIC PANEL
Calcium: 8.8 mg/dL (ref 8.4–10.5)
Creatinine, Ser: 0.76 mg/dL (ref 0.50–1.10)
GFR calc Af Amer: >90 mL/min (ref >90)
GFR calc non Af Amer: >90 mL/min (ref >90)

## 2012-07-07 MED ORDER — POTASSIUM CHLORIDE IN NACL 20-0.9 MEQ/L-% IV SOLN
INTRAVENOUS | Status: DC
  Administered 2012-07-07: 11:00:00 via INTRAVENOUS
  Administered 2012-07-07 – 2012-07-08 (×2): 125 mL/h via INTRAVENOUS
  Administered 2012-07-08 – 2012-07-09 (×2): via INTRAVENOUS
  Filled 2012-07-07 (×9): qty 1000

## 2012-07-07 MED ORDER — MORPHINE SULFATE 2 MG/ML IJ SOLN
2.0000 mg | INTRAMUSCULAR | Status: DC | PRN
  Administered 2012-07-07 (×2): 2 mg via INTRAVENOUS
  Filled 2012-07-07 (×2): qty 1

## 2012-07-07 NOTE — Progress Notes (Signed)
Subjective: Pt seen at bedside. Abdominal/flank pain better but still present, worse with sneezing/coughing/etc; requests increase in pain medication. No dysuria or blood in urine. Some subjective fever intermittently. Overall feels "better but not 100%." Passing flatus but no BM yet.  Objective: Vital signs in last 24 hours: Temp:  [98.4 F (36.9 C)-99.4 F (37.4 C)] 99 F (37.2 C) (01/19 0535) Pulse Rate:  [75-93] 93  (01/19 0535) Resp:  [18] 18  (01/19 0535) BP: (150-152)/(91-98) 150/95 mmHg (01/19 0535) SpO2:  [98 %-100 %] 98 % (01/19 0535)  Physical exam: GEN: adult female in NAD; ambulating in room without assistance PSYCH: pleasant/cooperative, appropriate CV: RRR, normal S1/S2, no murmur appreciated PULM: CTAB, normal work of breathing, no wheezes ABD: soft, mild right-sided flank and abdominal tenderness remains, no CVA tenderness NEURO: moves all extremities spontaneously; no focal deficits  Labs: Results for orders placed during the hospital encounter of 07/05/12 (from the past 24 hour(s))  CBC     Status: Abnormal   Collection Time   07/07/12  4:55 AM      Component Value Range   WBC 6.5  4.0 - 10.5 K/uL   RBC 3.60 (*) 3.87 - 5.11 MIL/uL   Hemoglobin 11.3 (*) 12.0 - 15.0 g/dL   HCT 09.8 (*) 11.9 - 14.7 %   MCV 91.4  78.0 - 100.0 fL   MCH 31.4  26.0 - 34.0 pg   MCHC 34.3  30.0 - 36.0 g/dL   RDW 82.9  56.2 - 13.0 %   Platelets 160  150 - 400 K/uL  BASIC METABOLIC PANEL     Status: Abnormal   Collection Time   07/07/12  4:55 AM      Component Value Range   Sodium 137  135 - 145 mEq/L   Potassium 3.4 (*) 3.5 - 5.1 mEq/L   Chloride 105  96 - 112 mEq/L   CO2 24  19 - 32 mEq/L   Glucose, Bld 89  70 - 99 mg/dL   BUN 3 (*) 6 - 23 mg/dL   Creatinine, Ser 8.65  0.50 - 1.10 mg/dL   Calcium 8.8  8.4 - 78.4 mg/dL   GFR calc non Af Amer >90  >90 mL/min   GFR calc Af Amer >90  >90 mL/min    Studies/Results: Ct Abdomen Pelvis W Contrast 07/05/2012  IMPRESSION:  1.  CT  findings consistent with right-sided pyelonephritis.  No renal abscess. 2.  Normal appendix. 3.  Small right-sided ovarian dermoid.     Scheduled Meds:    . cefTRIAXone (ROCEPHIN)  IV  1 g Intravenous Q24H  . heparin  5,000 Units Subcutaneous Q8H  . influenza  inactive virus vaccine  0.5 mL Intramuscular Tomorrow-1000  . nicotine  14 mg Transdermal Daily  . potassium chloride  40 mEq Oral BID   Continuous Infusions:    . sodium chloride 125 mL/hr (07/07/12 0328)   PRN Meds:acetaminophen, acetaminophen, bisacodyl, morphine injection, ondansetron (ZOFRAN) IV, ondansetron, oxyCODONE Oxycodone x 2  Zofran x 0   Assessment/Plan: [name] [name] is a 27 y.o. year old female who presented with abdominal pain and admitted for with sepsis (tachycardia, fever) due to right sided pyelonephritis with possible sexually transmitted infection.   # Abdominal pain, nausea/vomiting, fevers. Improving, WBC trending down; intermittent elevated temps remain  Likely 2/2 pyelonephritis but dermoid cyst noted on CT. -morphine IV with oxycodone PRN for pain -Zofran PRN for nausea, Dulcolax for constipation -Monitor symptoms   # Pyelonephritis with sepsis. Last  fever 102 6:30 AM on 1/18.  She received first antibiotic dose at 1800 01/17.  -urine culture shows E.coli >100,000 CFU/mL; sensitivities pending -Cont CTX 1 g q 24 -will consider adding Zosyn to broaden coverage for Enteroccus, etc, if pt does not continue to improve or if fevers worsen  # Vag Discharge. Broad differential, but no physical exam evidence of PID. -Wet prep showing BV.  -Hold metronidazole treatment due to concern for nausea that may complicate clinical picture with her still having fevers.  -GC/Chlamydia probe negative  PSYCH # Tobacco Abuse. 1 ppd x 6 years. - Nicotine patch   FEN # Hypokalemia.  2.9 at admission. 3.0 after 40 mEq PO x 3,  -magnesium 1.5 on 1/18 -continue PO supplementation # IVF: NS @ 125>>>add  K  GI # Diet: regular  PPX # DVT PPx. Heparin SQ   DISPO: Management as above. Discharge home once symptoms improved/resolved.     LOS: 2 days   Street, [place]

## 2012-07-07 NOTE — Progress Notes (Signed)
I have seen and examined this patient. I have discussed with Dr Casper Harrison.  I agree with their findings and plans as documented in their progress note.  Clinically improving.  Continue IV antibiotic pending culture results. Anticipate discharge home tomorrow with close outpatient follow up.Marland Kitchen

## 2012-07-08 LAB — BASIC METABOLIC PANEL
Chloride: 104 mEq/L (ref 96–112)
GFR calc Af Amer: >90 mL/min (ref >90)
Potassium: 4.2 mEq/L (ref 3.5–5.1)

## 2012-07-08 LAB — URINE CULTURE: Colony Count: >=100000

## 2012-07-08 LAB — CBC
Platelets: 188 10*3/uL (ref 150–400)
RDW: 13.3 % (ref 11.5–15.5)
WBC: 3.2 10*3/uL — ABNORMAL LOW (ref 4.0–10.5)

## 2012-07-08 MED ORDER — CIPROFLOXACIN HCL 500 MG PO TABS
500.0000 mg | ORAL_TABLET | Freq: Two times a day (BID) | ORAL | Status: DC
  Administered 2012-07-08 – 2012-07-09 (×3): 500 mg via ORAL
  Filled 2012-07-08 (×5): qty 1

## 2012-07-08 MED ORDER — HYDRALAZINE HCL 20 MG/ML IJ SOLN
5.0000 mg | Freq: Four times a day (QID) | INTRAMUSCULAR | Status: DC | PRN
  Filled 2012-07-08: qty 0.25

## 2012-07-08 NOTE — Progress Notes (Signed)
I examined this patient and discussed the care plan with Dr Casper Harrison and the [facility] team and agree with assessment and plan as documented in the progress note above.

## 2012-07-08 NOTE — Progress Notes (Signed)
Subjective: Pt seen at bedside. Abdominal/flank pain improving, controlled with PO medication. Continues to deny dysuria or blood in urine. Some subjective fever intermittently, but also improved. Overall feels "better but not 100%."  Objective: Vital signs in last 24 hours: Temp:  [98 F (36.7 C)-99.3 F (37.4 C)] 98.6 F (37 C) (01/20 6213) Pulse Rate:  [76-77] 77  (01/20 0633) Resp:  [18] 18  (01/20 0633) BP: (150-153)/(90-107) 150/90 mmHg (01/20 0633) SpO2:  [100 %] 100 % (01/20 0865)  Physical exam: GEN: adult female in NAD; ambulating in room without assistance PSYCH: pleasant/cooperative, appropriate CV: RRR, normal S1/S2, no murmur appreciated PULM: CTAB, normal work of breathing, no wheezes ABD: soft, improving right-sided flank and abdominal tenderness, no CVA tenderness NEURO: moves all extremities spontaneously; no focal deficits  Labs: Results for orders placed during the hospital encounter of 07/05/12 (from the past 24 hour(s))  CBC     Status: Abnormal   Collection Time   07/08/12  5:30 AM      Component Value Range   WBC 3.2 (*) 4.0 - 10.5 K/uL   RBC 3.59 (*) 3.87 - 5.11 MIL/uL   Hemoglobin 11.4 (*) 12.0 - 15.0 g/dL   HCT 78.4 (*) 69.6 - 29.5 %   MCV 91.1  78.0 - 100.0 fL   MCH 31.8  26.0 - 34.0 pg   MCHC 34.9  30.0 - 36.0 g/dL   RDW 28.4  13.2 - 44.0 %   Platelets 188  150 - 400 K/uL  BASIC METABOLIC PANEL     Status: Abnormal   Collection Time   07/08/12  5:30 AM      Component Value Range   Sodium 138  135 - 145 mEq/L   Potassium 4.2  3.5 - 5.1 mEq/L   Chloride 104  96 - 112 mEq/L   CO2 22  19 - 32 mEq/L   Glucose, Bld 107 (*) 70 - 99 mg/dL   BUN 3 (*) 6 - 23 mg/dL   Creatinine, Ser 1.02  0.50 - 1.10 mg/dL   Calcium 9.1  8.4 - 72.5 mg/dL   GFR calc non Af Amer >90  >90 mL/min   GFR calc Af Amer >90  >90 mL/min    Studies/Results: Ct Abdomen Pelvis W Contrast 07/05/2012  IMPRESSION:  1.  CT findings consistent with right-sided pyelonephritis.  No  renal abscess. 2.  Normal appendix. 3.  Small right-sided ovarian dermoid.     Scheduled Meds:    . cefTRIAXone (ROCEPHIN)  IV  1 g Intravenous Q24H  . heparin  5,000 Units Subcutaneous Q8H  . nicotine  14 mg Transdermal Daily  . potassium chloride  40 mEq Oral BID   Continuous Infusions:    . 0.9 % NaCl with KCl 20 mEq / L 125 mL/hr (07/08/12 0307)   PRN Meds:acetaminophen, acetaminophen, bisacodyl, morphine injection, ondansetron (ZOFRAN) IV, ondansetron, oxyCODONE  Assessment/Plan: [name] [name] is a 27 y.o. year old female who presented with abdominal pain and admitted for with sepsis (tachycardia, fever) due to right sided pyelonephritis with possible sexually transmitted infection.   # Abdominal pain, nausea/vomiting, fevers. Pain generally improving, WBC trending down; intermittent elevated temps remained 1/19, but last fever 1/18. Pain most likely 2/2 pyelonephritis but dermoid cyst noted on CT. -initially managed with morphine IV; transitioning to oxycodone PO PRN for pain -Zofran PRN for nausea, Dulcolax for constipation -Monitor symptoms   # Pyelonephritis with sepsis. Last fever 102 6:30 AM on 1/18.  She received first  antibiotic dose at 1800 01/17.  -urine culture shows E.coli >100,000 CFU/mL; resistant to ampicillin, intermediate to cefazolin -Cont CTX 1 g q 24; transition to PO tomorrow (likely Cipro), for total 7 days  # Vag Discharge. Broad differential, but no physical exam evidence of PID. No further complaints as of 1/19. -Wet prep showing BV.   -held metronidazole treatment due to concern for nausea that may complicate clinical picture  -likely will start metronidazole when transitioning to PO abx, above -GC/Chlamydia probe negative  PSYCH # Tobacco Abuse. 1 ppd x 6 years. - Nicotine patch   FEN # Hypokalemia.  2.9 at admission. 3.0 after 40 mEq PO x 3,  -magnesium 1.5 on 1/18 -PO potassium supplementation 1/18 and 1/19 -improved 1/20 # IVF: NS @  125>>>added K on 1/19  GI # Diet: regular  PPX # DVT PPx. Heparin SQ   DISPO: Management as above. Discharge home once symptoms improved/resolved, possibly as early as 1/21 -will need PCP follow-up, possibly referral to OBGYN for incidental dermoid cyst finding   LOS: 3 days   Luba Matzen, [place]

## 2012-07-09 DIAGNOSIS — B9620 Unspecified Escherichia coli [E. coli] as the cause of diseases classified elsewhere: Secondary | ICD-10-CM | POA: Diagnosis present

## 2012-07-09 DIAGNOSIS — A419 Sepsis, unspecified organism: Secondary | ICD-10-CM | POA: Diagnosis present

## 2012-07-09 LAB — BASIC METABOLIC PANEL
CO2: 25 mEq/L (ref 19–32)
Calcium: 9.1 mg/dL (ref 8.4–10.5)
Chloride: 104 mEq/L (ref 96–112)
GFR calc Af Amer: >90 mL/min (ref >90)
Sodium: 139 mEq/L (ref 135–145)

## 2012-07-09 MED ORDER — TRAMADOL HCL 50 MG PO TABS: 50.0000 mg | ORAL_TABLET | Freq: Four times a day (QID) | ORAL | Status: DC | PRN

## 2012-07-09 MED ORDER — ONDANSETRON HCL 4 MG PO TABS: 4.0000 mg | ORAL_TABLET | Freq: Four times a day (QID) | ORAL | Status: DC | PRN

## 2012-07-09 MED ORDER — METRONIDAZOLE 500 MG PO TABS: 500.0000 mg | ORAL_TABLET | Freq: Two times a day (BID) | ORAL | Status: DC

## 2012-07-09 MED ORDER — CIPROFLOXACIN HCL 500 MG PO TABS: 500.0000 mg | ORAL_TABLET | Freq: Two times a day (BID) | ORAL | Status: AC

## 2012-07-09 MED ORDER — CIPROFLOXACIN HCL 500 MG PO TABS: 500.0000 mg | ORAL_TABLET | Freq: Two times a day (BID) | ORAL | Status: DC

## 2012-07-09 NOTE — Discharge Summary (Signed)
Physician Discharge Summary  Patient ID: [name] [name] MRN: [id number] DOB/AGE: Dec 15, 1985 27 y.o.  Admit date: 07/05/2012 Discharge date: 07/09/2012  Admission Diagnoses: Sepsis due to urinary tract infection Right Sided Pyelonephritis  Discharge Diagnoses:  Principal Problem:  *Sepsis due to urinary tract infection Active Problems:  Pyelonephritis due to Escherichia coli  Hypokalemia   Discharged Condition: stable  Hospital Course:  [name] [name] is a 27 y.o. year old female who presented with severe abdominal pain, nausea, vomiting, tachycardia, and fever who was found to have a CT scan consistent with right sided pyelonephritis coupled with a urinalysis consistent with an infection. She was admitted for treatment of Sepsis due to urinary tract infection.   Sepsis due to E. Coli - Urine culture grew E. Coli. Patient treated with Ceftriaxone for 3 days. Then transitioned to PO cipro once she was tolerating PO. Patient afebrile for > 72 hours prior to discharge.   Bacterial Vaginosis - Found on wet prep at admission. Not treated as inpatient due to persistent nausea. Given prescription to take after completing cipro.   Hypokalemia - 3.0 at admission. Given K+ and normalized after 2 days. 4.1 on day of discharge.   Elevated BP - Has diagnosis of HTN without treatment. Given PRN hydralazine in hospital for SBP > 180 or DBP > 110. Consistently elevated BP's. Needs outpatient management.    Follow Up Issues:  1. Resolution of infections (pyelo and BV) 2. BP control   Consults: None  Significant Diagnostic Studies:   Ct Abdomen Pelvis W Contrast  07/05/2012  IMPRESSION:  1.  CT findings consistent with right-sided pyelonephritis.  No renal abscess. 2.  Normal appendix. 3.  Small right-sided ovarian dermoid.   Original Report Authenticated By: Rudie Meyer, M.D.    Urine Culture - E. Coli sensitive to all but ampicillin and cefazolin    Treatments: IV hydration and  antibiotics: ceftriaxone, cipro  Discharge Exam: Blood pressure 144/95, pulse 70, temperature 98 F (36.7 C), temperature source Oral, resp. rate 18, height 5\' 7"  (1.702 m), weight 195 lb (88.451 kg), last menstrual period 05/29/2012, SpO2 98.00%. GEN: adult female in NAD; sleeping  PSYCH: pleasant/cooperative, appropriate  CV: RRR, normal S1/S2, no murmur appreciated  PULM: CTAB, normal work of breathing, no wheezes  ABD: soft, improving right-sided flank and abdominal tenderness, no CVA tenderness  NEURO: moves all extremities spontaneously; no focal deficits   Disposition: 01-Home or Self Care      Discharge Orders    Future Appointments: Provider: Department: Dept Phone: Center:   07/15/2012 2:30 PM Bobbye Morton, MD [facility] [phone] [facility]   07/24/2012 1:45 PM Adam Phenix, MD [facility] [phone] [facility]     Future Orders Please Complete By Expires   Discharge patient          Medication List     As of 07/09/2012  3:09 PM    TAKE these medications         aspirin 325 MG tablet   Take 650 mg by mouth once. For pain.      ciprofloxacin 500 MG tablet   Commonly known as: CIPRO   Take 1 tablet (500 mg total) by mouth 2 (two) times daily.      ibuprofen 200 MG tablet   Commonly known as: ADVIL,MOTRIN   Take 400 mg by mouth every 6 (six) hours as needed. For pain.      metroNIDAZOLE 500 MG tablet   Commonly known as: FLAGYL  Take 1 tablet (500 mg total) by mouth 2 (two) times daily.      ondansetron 4 MG tablet   Commonly known as: ZOFRAN   Take 1 tablet (4 mg total) by mouth every 6 (six) hours as needed for nausea.      traMADol 50 MG tablet   Commonly known as: ULTRAM   Take 1 tablet (50 mg total) by mouth every 6 (six) hours as needed for pain.         Follow-up Information    Follow up with Maryjean Ka, MD. On 07/15/2012. (Appointment time is 2:30 PM.)    Contact information:   [address] [place] [address] [phone]       Follow up with Scheryl Darter, MD. On 07/24/2012. (scheduled at 145)    Contact information:   [address] [place] [address] [phone]          Signed: Mat Carne 07/09/2012, 3:09 PM

## 2012-07-09 NOTE — Progress Notes (Signed)
Daily Progress Note  Family Medicine Resident Pager [phone]  Patient name: [name] [name] Medical record number: [id number] Date of birth: [birth year] Age: 27 y.o. Gender: female  Overview: 27 year old w/ E. Coli pyelonephritis  Subjective: Patient still complaining of nausea and decreased PO intake, lying in bed sleeping at 9:30 AM  Objective: Vital signs in last 24 hours: Temp:  [98 F (36.7 C)-98.4 F (36.9 C)] 98 F (36.7 C) (01/21 0646) Pulse Rate:  [57-85] 70  (01/21 0646) Resp:  [18-19] 18  (01/21 0646) BP: (144-179)/(83-110) 144/95 mmHg (01/21 0646) SpO2:  [98 %-100 %] 98 % (01/21 0646)  Physical exam: GEN: adult female in NAD; sleeping  PSYCH: pleasant/cooperative, appropriate CV: RRR, normal S1/S2, no murmur appreciated PULM: CTAB, normal work of breathing, no wheezes ABD: soft, improving right-sided flank and abdominal tenderness, no CVA tenderness NEURO: moves all extremities spontaneously; no focal deficits  Labs: Results for orders placed during the hospital encounter of 07/05/12 (from the past 24 hour(s))  BASIC METABOLIC PANEL     Status: Abnormal   Collection Time   07/09/12  6:15 AM      Component Value Range   Sodium 139  135 - 145 mEq/L   Potassium 4.1  3.5 - 5.1 mEq/L   Chloride 104  96 - 112 mEq/L   CO2 25  19 - 32 mEq/L   Glucose, Bld 105 (*) 70 - 99 mg/dL   BUN 4 (*) 6 - 23 mg/dL   Creatinine, Ser 2.95  0.50 - 1.10 mg/dL   Calcium 9.1  8.4 - 28.4 mg/dL   GFR calc non Af Amer >90  >90 mL/min   GFR calc Af Amer >90  >90 mL/min    Studies/Results: Ct Abdomen Pelvis W Contrast 07/05/2012  IMPRESSION:  1.  CT findings consistent with right-sided pyelonephritis.  No renal abscess. 2.  Normal appendix. 3.  Small right-sided ovarian dermoid.     Scheduled Meds:    . ciprofloxacin  500 mg Oral BID  . heparin  5,000 Units Subcutaneous Q8H  . nicotine  14 mg Transdermal Daily   Continuous Infusions:    . 0.9 % NaCl with KCl 20 mEq / L 125  mL/hr at 07/09/12 0431   PRN Meds:acetaminophen, acetaminophen, bisacodyl, hydrALAZINE, ondansetron (ZOFRAN) IV, ondansetron, oxyCODONE  Assessment/Plan: [name] [name] is a 27 y.o. year old female who presented with abdominal pain and admitted for with sepsis (tachycardia, fever) due to right sided pyelonephritis with possible sexually transmitted infection.   # Abdominal pain, nausea/vomiting, fevers. Pain generally improving, WBC trending down; intermittent elevated temps remained 1/19, but last fever 1/18. Pain most likely 2/2 pyelonephritis but dermoid cyst noted on CT. -initially managed with morphine IV; transitioning to oxycodone PO PRN for pain -Zofran PRN for nausea, Dulcolax for constipation -Monitor symptoms   # Pyelonephritis with sepsis. Last fever 102 6:30 AM on 1/18.  She received first antibiotic dose at 1800 01/17.  -urine culture shows E.coli >100,000 CFU/mL; resistant to ampicillin, intermediate to cefazolin -Cont CTX 1 g q 24; transition to PO tomorrow (likely Cipro), for total 7 days  # Vag Discharge. Broad differential, but no physical exam evidence of PID. No further complaints as of 1/19. -Wet prep showing BV.   -held metronidazole treatment due to concern for nausea that may complicate clinical picture  -likely will start metronidazole when transitioning to PO abx, above -GC/Chlamydia probe negative  PSYCH # Tobacco Abuse. 1 ppd x 6 years. - Nicotine patch  FEN # Hypokalemia.  2.9 at admission. 3.0 after 40 mEq PO x 3,  -magnesium 1.5 on 1/18 -PO potassium supplementation 1/18 and 1/19 -improved 1/20 # IVF: NS @ 125>>>added K on 1/19  GI # Diet: regular  PPX # DVT PPx. Heparin SQ   DISPO: D/C today after next dose of PO meds -will need PCP follow-up, possibly referral to OBGYN for incidental dermoid cyst finding   LOS: 4 days   Mat Carne

## 2012-07-09 NOTE — Progress Notes (Signed)
Utilization review completed. Chistina Roston, RN, BSN. 

## 2012-07-09 NOTE — Progress Notes (Signed)
I examined this patient and discussed the care plan with Dr Williamson and the FPTS team and agree with assessment and plan as documented in the progress note above.  

## 2012-07-09 NOTE — Progress Notes (Signed)
Patient discharged in stable condition via wheelchair. Discharge instructions were given and explained

## 2012-07-15 ENCOUNTER — Inpatient Hospital Stay: Payer: Self-pay | Admitting: Family Medicine

## 2012-07-24 ENCOUNTER — Encounter: Payer: Self-pay | Admitting: Obstetrics & Gynecology

## 2012-12-24 ENCOUNTER — Encounter (HOSPITAL_COMMUNITY): Payer: Self-pay | Admitting: Emergency Medicine

## 2012-12-24 ENCOUNTER — Emergency Department (HOSPITAL_COMMUNITY)
Admission: EM | Admit: 2012-12-24 | Discharge: 2012-12-24 | Disposition: A | Discharge status: Home / Self Care | Payer: Self-pay | Attending: Emergency Medicine | Admitting: Emergency Medicine

## 2012-12-24 DIAGNOSIS — I10 Essential (primary) hypertension: Secondary | ICD-10-CM | POA: Insufficient documentation

## 2012-12-24 DIAGNOSIS — L259 Unspecified contact dermatitis, unspecified cause: Secondary | ICD-10-CM | POA: Insufficient documentation

## 2012-12-24 DIAGNOSIS — Z331 Pregnant state, incidental: Secondary | ICD-10-CM | POA: Insufficient documentation

## 2012-12-24 DIAGNOSIS — L309 Dermatitis, unspecified: Secondary | ICD-10-CM

## 2012-12-24 DIAGNOSIS — Z7982 Long term (current) use of aspirin: Secondary | ICD-10-CM | POA: Insufficient documentation

## 2012-12-24 DIAGNOSIS — J45909 Unspecified asthma, uncomplicated: Secondary | ICD-10-CM | POA: Insufficient documentation

## 2012-12-24 DIAGNOSIS — F17200 Nicotine dependence, unspecified, uncomplicated: Secondary | ICD-10-CM | POA: Insufficient documentation

## 2012-12-24 HISTORY — DX: Other seasonal allergic rhinitis: J30.2

## 2012-12-24 LAB — POCT PREGNANCY, URINE: Preg Test, Ur: POSITIVE — AB

## 2012-12-24 MED ORDER — HYDROCORTISONE 1 % EX CREA
TOPICAL_CREAM | Freq: Two times a day (BID) | CUTANEOUS | Status: DC
Start: 2012-12-24 — End: 2013-03-28

## 2012-12-24 MED ORDER — PRENATAL COMPLETE 14-0.4 MG PO TABS: 1.0000 | ORAL_TABLET | Freq: Every day | ORAL | Status: DC

## 2012-12-24 MED ORDER — ACETAMINOPHEN 500 MG PO TABS: 500.0000 mg | ORAL_TABLET | Freq: Four times a day (QID) | ORAL | Status: DC | PRN

## 2012-12-24 NOTE — Discharge Instructions (Signed)
Apply cortisone cream to affected areas. Take tylenol as needed for pain. Take prenatal vitamins as directed. Follow up with [facility]. Use the resource guide below.   RESOURCE GUIDE  Chronic Pain Problems: Contact [facility]  [phone] Patients need to be referred by their primary care doctor.  Insufficient Money for Medicine: Contact United Way:  call "211."   No Primary Care Doctor: - Call Health Connect  [phone] - can help you locate a primary care doctor that  accepts your insurance, provides certain services, etc. - Physician Referral Service- [phone]  Agencies that provide inexpensive medical care: - [facility]  [phone] - [facility]  [phone] - [facility]  [phone] - Women's Clinic  [phone] - Planned Parenthood  [phone] [facility]  [phone]  Medicaid-accepting [facility] Providers: - [facility]- [address]  [phone], Mon-Fri 9am-7pm, Sat 9am-1pm - [facility]- [address] [place], [address] [place]  [phone] - [facility]- [address] [place] [address] [place]  [phone] [facility]- [address]  [phone] - Renaye Rakers- [address] [place], [address], [phone]  Only accepts [place] patients after they have their name  applied to their card  Self Pay (no insurance) in [place]: - Sickle Cell Patients - [facility]  [address] [place], [phone] - [facility]- [address] [place]  [phone]       [facility] [place]- [address]       -     [facility]- see information above (Speak to [employer] if you do not have insurance)       -  [facility]- [address] [place],  [phone]       -  [facility]- [address] [phone]       -  Dr Julio Sicks-  [address], [place], [phone]       -  [facility] - [address], [phone]       -  [facility]- [address] [phone], also [address], [phone]       -     [facility]- [address] [place], [phone], 1st & 3rd Saturday         every month, 10am-1pm  -     [facility]   [address] [place], [place].   Phone:  [phone], Fax:  [phone]. Hours of Operation:  9 am - 6 pm, M-F.  -     [facility]   [address],    Phone: [phone], Fax: [phone]. Hours of Operation:  8:30 am - 5:30 pm, M-F.  [facility] [address] [place], [place] [address] [phone]  [facility] [address] [place], [place] [address] [phone]  1) Find a Doctor and Pay Out of Pocket Although you won't have to find out who is covered by your insurance plan, it is a good idea to ask around and get recommendations. You will then need to call the office and see if the doctor you have chosen will accept you as a new patient and  what types of options they offer for patients who are self-pay. Some doctors offer discounts or will set up payment plans for their patients who do not have insurance, but you will need to ask so you aren't surprised when you get to your appointment.  2) Contact Your Local Health Department Not all health departments have doctors that can see patients for sick visits, but many do, so it is worth a call to see if yours does. If you don't know where your local health department is, you can check in your phone book. The CDC also has a tool to help you locate your state's health department, and many state websites also have listings of all of their local health departments.  3) Find a Walk-in Clinic If your illness is not likely to be very severe or complicated, you may want to try a walk in clinic.  These are popping up all over the country in pharmacies, drugstores, and shopping centers. They're usually staffed by nurse practitioners or physician assistants that have been trained to treat common illnesses and complaints. They're usually fairly quick and inexpensive. However, if you have serious medical issues or chronic medical problems, these are probably not your best option  STD Testing - [facility] Department of [facility], STD Clinic, [address], [place], phone [phone] or [phone].  Monday - Friday, call for an appointment. [facility] Department of [employer], STD Clinic, [address], [place], phone [phone] or [phone].  Monday - Friday, call for an appointment.  Abuse/Neglect: [facility] Child Abuse Hotline 201-362-1253 [facility] Child Abuse Hotline [phone] (After Hours)  Emergency Shelter:  [facility] 5136226548  Maternity Homes: - [facility] at the [facility] [phone] - [facility] [phone]  MRSA Hotline #:   414-831-6473  Dental Assistance If unable to pay or uninsured, contact:  [facility]. to become qualified for the adult dental clinic.  Patients with Medicaid: [facility] [phone] [address] Quails, [phone] [address] [phone]  If unable to pay, or uninsured, contact [facility] [phone] in [place], [phone] in [facility]) to become qualified for the adult dental clinic  [facility] [address] [place], [place] [address] [phone] [email or web address]  Other Proofreader Services: - [facility]- [address] [place], [place], [place], [address], [phone], Ext. [phone], 2nd and 4th Thursday of the month at 6:30am.  10 clients each day by appointment, can sometimes see walk-in patients if someone does not show for an  appointment. [facility]- [address] [place]ins [place], [place], [address], [phone] - [facility] [address] [place], [place], [address], [phone] - [facility]- [phone] [facility] Department- [phone] [facility] Health [phone]       Behavioral Health Resources in the [facility]  Intensive Outpatient Programs: [facility]      [address] [place], [place] [phone] Both a day and evening program       [facility] Outpatient     [address]        [place], [place] [address] [phone]         ADS: [facility] [address] [place] [phone]  [facility] Mental Health ACCESS LINE: 8308367905 or [phone] [address]  [place], [place] [address] [email or web address]   Substance Abuse Resources: - [facility] Drug [facility]  204-044-5555 - [facility] [phone] - [facility] [phone] Floydene Flock [phone] - Residential & Outpatient Substance Abuse Program  3065902078  Psychological Services: [facility]  [phone] [facility]  [phone] - [facility], [phone] [place]. [address] [place], ACCESS LINE: 820-711-4989 or ([phone], [email or web address]  Mobile Crisis Teams:                                        Therapeutic Alternatives         Mobile Crisis Care Unit [phone]             [facility] [address] [place]ette Otto [phone]                                         Interventionist [address] [place] [address] [place] [phone]  Self-Help/Support Groups: Mental Health Assoc. of [employer] of support groups [phone] (call for more info)  Narcotics Anonymous (NA) Caring Services [address] [place] - 2 meetings at this location  Residential Treatment Programs:  [facility]      [address]        [place]       [phone]         [facility] [address] [place] [address] [place], [place]  [address] [phone]  [facility]  [address] [place], [place] [address] [phone] Admissions: 8am-3pm M-F  [facility]     [address]        [place], [place] [address]       [phone]         [facility] [address] [place] Manns [place], [place] [phone]  [facility] [address] [place], [place] [phone]  [facility] - Intensive Outpatient      [address]     [place], [place]       [phone]         [facility] ([facility]     [address] [place], [place] [phone] or [phone]  Residential Treatment Services (RTS), Medicaid [address] [place], [place] [phone]  [facility] [address]                                               [address] [place] [phone]  [facility] Resources: [phone]               General Therapy                                                Angie Fava, PhD        [phone]  [place], [place] [address]         [phone]   Insurance  [facility]al   [address] [place], [place] [address] [phone]  [facility] [address] [place], [place] [address] [phone] Insurance/Medicaid/sponsorship through [facility] and Families                                              [address]                                        [place], [place] [address]    Therapy/tele-psych/case         [phone]          [facility] [name], [place]  [address]  Adolescent/group home/case  management [phone]                                           Creola Corn PhD       General therapy       Insurance   [phone]         Dr. Lolly Mustache, Insurance, M-F [phone]  [facility] [place]  [employer] [facility] Dept. [address]                 [address]         [address] [place] [address]  Blondell Reveal Phone:  [phone]                                  Phone:  [phone]                   Phone:  [phone]  [facility] Mental Health, [phone] - [facility] - [facility]- [phone]       -     [facility] in [place], [address],             [phone], Insurance  [place] Child Abuse Hotline 785 450 8711 or [phone] (After Hours)

## 2012-12-24 NOTE — ED Provider Notes (Signed)
Medical screening examination/treatment/procedure(s) were performed by non-physician practitioner and as supervising physician I was immediately available for consultation/collaboration.   Carleene Cooper III, MD 12/24/12 2132

## 2012-12-24 NOTE — ED Notes (Signed)
Patient ambulated to restroom and tolerated well.  

## 2012-12-24 NOTE — ED Notes (Signed)
Patient states that she is having an eczema flare.  She states its on hands, arms, face, backs of legs, feet and back of neck.  She states it hurts bad and needs prescription. She states last flare was in January.

## 2012-12-24 NOTE — ED Provider Notes (Signed)
History    CSN: [id number] Arrival date & time 12/24/12  6295  First MD Initiated Contact with Patient 12/24/12 [phone]     Chief Complaint  Patient presents with  . Eczema   (Consider location/radiation/quality/duration/timing/severity/associated sxs/prior Treatment) HPI Comments: Patient is a 27 year old female with a past medical history of eczema who presents with a 2 day history of rash on her hands, arms, back, neck, and legs. Symptoms started gradually and progressively worsened since the onset. Patient reports associated itching with the rash and also pain. The pain is dull and moderate. Patient has not tried anything for symptoms. Patient reports these symptoms are typical of her eczema flare and her last one was 6 months ago. No aggravating/alleviating factors. Patient thinks she may be pregnant as she has not had a period in 5 months.   Past Medical History  Diagnosis Date  . Eczema   . Hypertension   . Asthma, allergic   . Seasonal allergies    Past Surgical History  Procedure Laterality Date  . Wisdom tooth extraction     No family history on file. History  Substance Use Topics  . Smoking status: Current Every Day Smoker -- 0.25 packs/day    Types: Cigarettes  . Smokeless tobacco: Not on file  . Alcohol Use: Yes     Comment: occasional   OB History   Grav Para Term Preterm Abortions TAB SAB Ect Mult Living                 Review of Systems  Skin: Positive for rash.  All other systems reviewed and are negative.    Allergies  Shellfish allergy and Sulfa antibiotics  Home Medications   Current Outpatient Rx  Name  Route  Sig  Dispense  Refill  . aspirin 325 MG tablet   Oral   Take 650 mg by mouth once. For pain.         Marland Kitchen ibuprofen (ADVIL,MOTRIN) 200 MG tablet   Oral   Take 400 mg by mouth every 6 (six) hours as needed. For pain.         . metroNIDAZOLE (FLAGYL) 500 MG tablet   Oral   Take 1 tablet (500 mg total) by mouth 2 (two) times  daily.   14 tablet   0   . ondansetron (ZOFRAN) 4 MG tablet   Oral   Take 1 tablet (4 mg total) by mouth every 6 (six) hours as needed for nausea.   10 tablet   0   . traMADol (ULTRAM) 50 MG tablet   Oral   Take 1 tablet (50 mg total) by mouth every 6 (six) hours as needed for pain.   20 tablet   0    BP 132/83  Pulse 73  Temp(Src) 98.6 F (37 C) (Oral)  Resp 20  Ht 5\' 6"  (1.676 m)  Wt 180 lb (81.647 kg)  BMI 29.07 kg/m2  SpO2 98%  LMP 08/15/2012 Physical Exam  Nursing note and vitals reviewed. Constitutional: She is oriented to person, place, and time. She appears well-developed and well-nourished. No distress.  HENT:  Head: Normocephalic and atraumatic.  Eyes: Conjunctivae are normal.  Neck: Normal range of motion.  Cardiovascular: Normal rate and regular rhythm.  Exam reveals no gallop and no friction rub.   No murmur heard. Pulmonary/Chest: Effort normal and breath sounds normal. She has no wheezes. She has no rales. She exhibits no tenderness.  Abdominal: Soft. She exhibits no distension. There  is no tenderness. There is no rebound and no guarding.  Musculoskeletal: Normal range of motion.  Neurological: She is alert and oriented to person, place, and time. Coordination normal.  Speech is goal-oriented. Moves limbs without ataxia.   Skin: Skin is warm and dry.  Multiple skin-colored papules scattered at flexor surfaces of elbows, knees and also on back and feet with overlying excoriations.   Psychiatric: She has a normal mood and affect. Her behavior is normal.    ED Course  Procedures (including critical care time) Labs Reviewed  POCT PREGNANCY, URINE - Abnormal; Notable for the following:    Preg Test, Ur POSITIVE (*)    All other components within normal limits   No results found. 1. Eczema   2. Pregnancy as incidental finding     MDM  8:01 AM Patient is experiencing an eczema exacerbation. Patient thinks she may be pregnant and wants to be sure  before taking any medications. Urine pregnancy test pending.   8:43 AM Patient has a positive urine pregnancy test. Patient will have topical cortisone cream for eczema, tylenol for pain, and prenatal vitamins. Patient will have resources for follow up and OBGYN care.   Emilia Beck, [place] 12/24/12 [phone]

## 2013-01-30 ENCOUNTER — Encounter (HOSPITAL_COMMUNITY): Payer: Self-pay | Admitting: *Deleted

## 2013-01-30 ENCOUNTER — Inpatient Hospital Stay (HOSPITAL_COMMUNITY)
Admission: AD | Admit: 2013-01-30 | Discharge: 2013-01-30 | Disposition: A | Discharge status: Home / Self Care | Payer: Self-pay | Source: Ambulatory Visit | Attending: Family Medicine | Admitting: Family Medicine

## 2013-01-30 ENCOUNTER — Emergency Department (HOSPITAL_COMMUNITY)
Admission: EM | Admit: 2013-01-30 | Discharge: 2013-01-30 | Disposition: A | Discharge status: Home / Self Care | Payer: Self-pay | Attending: Emergency Medicine | Admitting: Emergency Medicine

## 2013-01-30 DIAGNOSIS — J45909 Unspecified asthma, uncomplicated: Secondary | ICD-10-CM | POA: Insufficient documentation

## 2013-01-30 DIAGNOSIS — Z3490 Encounter for supervision of normal pregnancy, unspecified, unspecified trimester: Secondary | ICD-10-CM

## 2013-01-30 DIAGNOSIS — O99330 Smoking (tobacco) complicating pregnancy, unspecified trimester: Secondary | ICD-10-CM | POA: Insufficient documentation

## 2013-01-30 DIAGNOSIS — O9989 Other specified diseases and conditions complicating pregnancy, childbirth and the puerperium: Secondary | ICD-10-CM | POA: Insufficient documentation

## 2013-01-30 DIAGNOSIS — I10 Essential (primary) hypertension: Secondary | ICD-10-CM | POA: Insufficient documentation

## 2013-01-30 DIAGNOSIS — Z79899 Other long term (current) drug therapy: Secondary | ICD-10-CM | POA: Insufficient documentation

## 2013-01-30 DIAGNOSIS — Z3480 Encounter for supervision of other normal pregnancy, unspecified trimester: Secondary | ICD-10-CM | POA: Insufficient documentation

## 2013-01-30 MED ORDER — PRENATAL COMPLETE 14-0.4 MG PO TABS: 1.0000 | ORAL_TABLET | Freq: Every day | ORAL | Status: DC

## 2013-01-30 NOTE — ED Provider Notes (Signed)
Medical screening examination/treatment/procedure(s) were performed by non-physician practitioner and as supervising physician I was immediately available for consultation/collaboration.  Hadeel Hillebrand, MD 01/30/13 1819 

## 2013-01-30 NOTE — ED Notes (Addendum)
Pt to ED for accurate pregnancy test. States she was told a cple months ago she was pregnant (by ED) and now she needs to know how far along she is so she can apply for insurance. LMP in Feb. No abd pain. No vaginal discharge. Pt states she is feeling the baby move.

## 2013-01-30 NOTE — ED Notes (Signed)
Patient states she is trying to get medicaid, but they advised her that she needs to get a note telling them exactly how far along she is. Patient states that she does not have an OB/GYN, "because I didn't have any insurance".

## 2013-01-30 NOTE — ED Provider Notes (Signed)
CSN: [id number]     Arrival date & time 01/30/13  1241 History     First MD Initiated Contact with Patient 01/30/13 1300     Chief Complaint  Patient presents with  . Follow-up   (Consider location/radiation/quality/duration/timing/severity/associated sxs/prior Treatment) HPI Comments: Patient is a 27 year old G1 P0 female presented to the emergency department for followup after a positive pregnancy test a few months ago. Patient was told by her insurance company she needed to obtain a dating ultrasound to be eligible for her insurance. Patient's last menstrual period was at the end of February. She's had no abdominal pain, vaginal discharge or bleeding. Patient reports active fetal movements daily. Patient has been taking prenatal vitamins but has run out recently.   Past Medical History  Diagnosis Date  . Eczema   . Hypertension   . Asthma, allergic   . Seasonal allergies    Past Surgical History  Procedure Laterality Date  . Wisdom tooth extraction     History reviewed. No pertinent family history. History  Substance Use Topics  . Smoking status: Current Every Day Smoker -- 0.25 packs/day    Types: Cigarettes  . Smokeless tobacco: Not on file  . Alcohol Use: Yes     Comment: occasional   OB History   Grav Para Term Preterm Abortions TAB SAB Ect Mult Living                 Review of Systems  Constitutional: Negative for fever.  Gastrointestinal: Negative for abdominal pain.  Genitourinary: Negative for vaginal bleeding, vaginal discharge, vaginal pain and pelvic pain.    Allergies  Shellfish allergy and Sulfa antibiotics  Home Medications   Current Outpatient Rx  Name  Route  Sig  Dispense  Refill  . diphenhydrAMINE (BENADRYL) 25 MG tablet   Oral   Take 25 mg by mouth every 6 (six) hours as needed for itching.         . hydrocortisone cream 1 %   Topical   Apply topically 2 (two) times daily.   30 g   2   . tacrolimus (PROTOPIC) 0.1 % ointment  Topical   Apply 1 application topically 2 (two) times daily.         . Prenatal Vit-Fe Fumarate-FA (PRENATAL COMPLETE) 14-0.4 MG TABS   Oral   Take 1 tablet by mouth daily.   60 each   3    BP 129/89  Pulse 76  Temp(Src) 97.7 F (36.5 C) (Oral)  Resp 18  SpO2 100%  LMP 08/11/2012 Physical Exam  Constitutional: She is oriented to person, place, and time. She appears well-developed and well-nourished. No distress.  HENT:  Head: Normocephalic and atraumatic.  Mouth/Throat: Oropharynx is clear and moist.  Eyes: Conjunctivae are normal.  Neck: Neck supple.  Cardiovascular: Normal rate, regular rhythm and normal heart sounds.   Pulmonary/Chest: Effort normal.  Abdominal: Soft. Bowel sounds are normal. She exhibits no distension. There is no tenderness. There is no rebound and no guarding.  Musculoskeletal: Normal range of motion.  Neurological: She is alert and oriented to person, place, and time.  Skin: Skin is warm and dry. She is not diaphoretic.  Psychiatric: She has a normal mood and affect.    ED Course   Procedures (including critical care time)  Labs Reviewed - No data to display No results found. 1. Pregnancy     MDM  Patient presenting requesting ultrasound for fetal dating for insurance coverage. Patient has no complaints of  abdominal pain, vaginal or pelvic pain, vaginal discharge or bleeding. Physical exam unremarkable. Advised patient that we will unable to obtain nonemergent OB dating ultrasound. Bedside ultrasound was performed with fetal visualization with cardiac activity. Advised patient to followup at the [facility] for pregnancy needs. Patient d/w with Dr. Redgie Grayer, agrees with plan. Patient is agreeable to plan. Patient is stable at time of discharge     Jeannetta Ellis, PA-C 01/30/13 1610

## 2013-03-28 ENCOUNTER — Inpatient Hospital Stay (HOSPITAL_COMMUNITY)
Admission: AD | Admit: 2013-03-28 | Discharge: 2013-03-28 | Disposition: A | Discharge status: Home / Self Care | Payer: Self-pay | Source: Ambulatory Visit | Attending: Obstetrics & Gynecology | Admitting: Obstetrics & Gynecology

## 2013-03-28 ENCOUNTER — Encounter (HOSPITAL_COMMUNITY): Payer: Self-pay

## 2013-03-28 DIAGNOSIS — O0930 Supervision of pregnancy with insufficient antenatal care, unspecified trimester: Secondary | ICD-10-CM | POA: Insufficient documentation

## 2013-03-28 DIAGNOSIS — O99891 Other specified diseases and conditions complicating pregnancy: Secondary | ICD-10-CM | POA: Insufficient documentation

## 2013-03-28 DIAGNOSIS — O0933 Supervision of pregnancy with insufficient antenatal care, third trimester: Secondary | ICD-10-CM

## 2013-03-28 DIAGNOSIS — L259 Unspecified contact dermatitis, unspecified cause: Secondary | ICD-10-CM | POA: Insufficient documentation

## 2013-03-28 DIAGNOSIS — L309 Dermatitis, unspecified: Secondary | ICD-10-CM

## 2013-03-28 HISTORY — DX: Lactose intolerance, unspecified: E73.9

## 2013-03-28 LAB — URINALYSIS, ROUTINE W REFLEX MICROSCOPIC
Bilirubin Urine: NEGATIVE
Hgb urine dipstick: NEGATIVE
Nitrite: NEGATIVE
Protein, ur: NEGATIVE mg/dL
Specific Gravity, Urine: 1.025 (ref 1.005–1.030)
Urobilinogen, UA: 2 mg/dL — ABNORMAL HIGH (ref 0.0–1.0)

## 2013-03-28 LAB — URINE MICROSCOPIC-ADD ON

## 2013-03-28 MED ORDER — TRIAMCINOLONE ACETONIDE 0.5 % EX CREA
TOPICAL_CREAM | Freq: Once | CUTANEOUS | Status: DC
  Filled 2013-03-28: qty 15

## 2013-03-28 MED ORDER — PREDNISONE 10 MG PO TABS: 10.0000 mg | ORAL_TABLET | Freq: Every day | ORAL | Status: DC

## 2013-03-28 MED ORDER — LORATADINE 10 MG PO TABS: 10.0000 mg | ORAL_TABLET | Freq: Every day | ORAL | Status: DC

## 2013-03-28 MED ORDER — TRIAMCINOLONE ACETONIDE 0.5 % EX CREA: TOPICAL_CREAM | Freq: Once | CUTANEOUS | Status: DC

## 2013-03-28 NOTE — MAU Note (Signed)
Wanting a check-up because she has not had any PNC; she has no insurance and she just started a new job;

## 2013-03-28 NOTE — MAU Note (Signed)
Patient states she has been unable to get Medicaid and has not had any prenatal care. States she is not having any problems with the pregnancy but wants to be checked out. Denies pain, leaking or bleeding and reports good fetal movement. Has had eczema and for the past 3-4 months is getting progressively worse. OTC medication is no longer working.

## 2013-03-28 NOTE — MAU Provider Note (Signed)
History     CSN: [id number]  Arrival date and time: 03/28/13 1621   First Provider Initiated Contact with Patient 03/28/13 1737      Chief Complaint  Patient presents with  . Eczema   HPI This is a 27 y.o. female at [redacted]w[redacted]d by LMP who presents requesting "checkup on baby" since she has had no prenatal care. Also c/o worsening of eczema.  Has tried lotions and OTC cortisone, without relief. No problems with pregnancy so far.   RN Note: Patient states she has been unable to get Medicaid and has not had any prenatal care. States she is not having any problems with the pregnancy but wants to be checked out. Denies pain, leaking or bleeding and reports good fetal movement. Has had eczema and for the past 3-4 months is getting progressively worse. OTC medication is no longer working.       OB History   Grav Para Term Preterm Abortions TAB SAB Ect Mult Living   1               Past Medical History  Diagnosis Date  . Eczema   . Hypertension   . Asthma, allergic   . Seasonal allergies   . Lactose intolerance     Past Surgical History  Procedure Laterality Date  . Wisdom tooth extraction      Family History  Problem Relation Age of Onset  . Hypertension Father   . Hypertension Maternal Aunt   . Hypertension Maternal Uncle   . Hypertension Maternal Grandmother   . Hypertension Maternal Grandfather   . Hypertension Paternal Grandmother     History  Substance Use Topics  . Smoking status: Current Every Day Smoker -- 0.25 packs/day    Types: Cigarettes  . Smokeless tobacco: Not on file  . Alcohol Use: Yes     Comment: occasional    Allergies:  Allergies  Allergen Reactions  . Shellfish Allergy Other (See Comments)    Stomach upset  . Sulfa Antibiotics Swelling    Prescriptions prior to admission  Medication Sig Dispense Refill  . diphenhydrAMINE (BENADRYL) 25 MG tablet Take 25 mg by mouth every 6 (six) hours as needed for itching or allergies.       . Prenatal  Vit-Fe Fumarate-FA (PRENATAL MULTIVITAMIN) TABS tablet Take 1 tablet by mouth daily at 12 noon.        Review of Systems  Constitutional: Negative for fever and malaise/fatigue.  Gastrointestinal: Negative for nausea, vomiting, abdominal pain, diarrhea and constipation.  Genitourinary: Negative for dysuria.  Skin: Positive for itching and rash (scattered areas of eczema on extremities, chest and trunk).  Neurological: Negative for dizziness.   Physical Exam   Blood pressure 146/80, pulse 91, temperature 98.5 F (36.9 C), temperature source Oral, resp. rate 18, height 5\' 6"  (1.676 m), weight 91.989 kg (202 lb 12.8 oz), last menstrual period 08/19/2012, SpO2 100.00%.  Physical Exam  Constitutional: She is oriented to person, place, and time. She appears well-developed and well-nourished. No distress.  HENT:  Head: Normocephalic.  Cardiovascular: Normal rate.   Respiratory: Effort normal.  GI: Soft. She exhibits no distension. There is no tenderness.  Musculoskeletal: Normal range of motion.  Neurological: She is alert and oriented to person, place, and time.  Skin: Skin is warm and dry. Rash (eczema patches on arms, legs, chest and abdomen. Dry looking. No exudate, no rash) noted.  Fetal heart rate reassuring.  No contractions  MAU Course  Procedures   Assessment  and Plan  A:   SIUP at [redacted]w[redacted]d        No prenatal care       Eczema  P:  Discussed with Dr Despina Hidden      10 day course of Prednisone 40mg        Triamcinolone cream       Will refer to our clinic for new ob       Schedule outpatient [name]  [facility] 03/28/2013, 5:37 PM

## 2013-03-29 LAB — URINE CULTURE: Colony Count: 5000

## 2013-04-24 ENCOUNTER — Encounter: Payer: Self-pay | Admitting: Obstetrics & Gynecology

## 2013-04-24 ENCOUNTER — Ambulatory Visit (INDEPENDENT_AMBULATORY_CARE_PROVIDER_SITE_OTHER): Payer: Self-pay | Admitting: Obstetrics & Gynecology

## 2013-04-24 VITALS — BP 124/89 | Temp 97.0°F | Wt 200.3 lb

## 2013-04-24 DIAGNOSIS — O09523 Supervision of elderly multigravida, third trimester: Secondary | ICD-10-CM

## 2013-04-24 DIAGNOSIS — O09529 Supervision of elderly multigravida, unspecified trimester: Secondary | ICD-10-CM

## 2013-04-24 DIAGNOSIS — O139 Gestational [pregnancy-induced] hypertension without significant proteinuria, unspecified trimester: Secondary | ICD-10-CM

## 2013-04-24 DIAGNOSIS — O10013 Pre-existing essential hypertension complicating pregnancy, third trimester: Secondary | ICD-10-CM

## 2013-04-24 DIAGNOSIS — Z23 Encounter for immunization: Secondary | ICD-10-CM

## 2013-04-24 DIAGNOSIS — O163 Unspecified maternal hypertension, third trimester: Secondary | ICD-10-CM

## 2013-04-24 DIAGNOSIS — O10019 Pre-existing essential hypertension complicating pregnancy, unspecified trimester: Secondary | ICD-10-CM

## 2013-04-24 DIAGNOSIS — Z3493 Encounter for supervision of normal pregnancy, unspecified, third trimester: Secondary | ICD-10-CM

## 2013-04-24 LAB — POCT URINALYSIS DIP (DEVICE)
Bilirubin Urine: NEGATIVE
Hgb urine dipstick: NEGATIVE
Ketones, ur: NEGATIVE mg/dL
Nitrite: NEGATIVE
Protein, ur: 30 mg/dL — AB
Specific Gravity, Urine: 1.02 (ref 1.005–1.030)
Urobilinogen, UA: 1 mg/dL (ref 0.0–1.0)
pH: 7 (ref 5.0–8.0)

## 2013-04-24 LAB — OB RESULTS CONSOLE GBS: GBS: NEGATIVE

## 2013-04-24 LAB — OB RESULTS CONSOLE GC/CHLAMYDIA: Gonorrhea: NEGATIVE

## 2013-04-24 MED ORDER — TETANUS-DIPHTH-ACELL PERTUSSIS 5-2.5-18.5 LF-MCG/0.5 IM SUSP: 0.5000 mL | Freq: Once | INTRAMUSCULAR | Status: DC

## 2013-04-24 NOTE — Progress Notes (Signed)
U/S scheduled 04/28/13 at 8 am.

## 2013-04-24 NOTE — Progress Notes (Signed)
   Subjective:    Emmagene Ortner is a G1P0 [redacted]w[redacted]d being seen today for her first obstetrical visit.  Her obstetrical history is significant for Hypertension diagnsed 8-9 yrs ago (pt not on meds anymore after losing 140 pounds); smoker, no prenatal care. Patient does intend to breast feed. Pregnancy history fully reviewed.  Patient reports itching on breasts and abdomen resolved with lotion.  Filed Vitals:   04/24/13 0945  BP: 124/89  Temp: 97 F (36.1 C)  Weight: 200 lb 4.8 oz (90.855 kg)    HISTORY: OB History  Gravida Para Term Preterm AB SAB TAB Ectopic Multiple Living  1             # Outcome Date GA Lbr Len/2nd Weight Sex Delivery Anes PTL Lv  1 CUR              Past Medical History  Diagnosis Date  . Eczema   . Hypertension   . Asthma, allergic   . Seasonal allergies   . Lactose intolerance    Past Surgical History  Procedure Laterality Date  . Wisdom tooth extraction     Family History  Problem Relation Age of Onset  . Adopted: Yes  . Hypertension Father   . Hypertension Maternal Aunt   . Hypertension Maternal Uncle   . Hypertension Maternal Grandmother   . Hypertension Maternal Grandfather   . Hypertension Paternal Grandmother      Exam    Uterus:     Pelvic Exam:    Perineum: No Hemorrhoids   Vulva: normal   Vagina:  normal mucosa, creamy d/c   pH: n/a   Cervix: 1 cm/thick/tone/post   Adnexa: normal adnexa   Bony Pelvis: average  System: Breast:  pt refused   Skin: Evidence of old eczema    Neurologic: normal, normal mood   Extremities: no erythema, induration, or nodules   HEENT sclera clear, anicteric, oropharynx clear, no lesions, neck supple with midline trachea and thyroid without masses   Mouth/Teeth mucous membranes moist, pharynx normal without lesions and dental hygiene good   Neck supple and no masses   Cardiovascular: regular rate and rhythm   Respiratory:  appears well, vitals normal, no respiratory distress, acyanotic, normal  RR   Abdomen: gravid nt   Urinary: urethral meatus normal      Assessment:    Pregnancy: G1P0 at 25 weeks 3 days with no prenatal care HTN chronic      Plan:     Initial labs drawn. Prenatal vitamins. Problem list reviewed and updated. Genetic Screening TOO LATE  Ultrasound discussed; fetal survey: ordered.  Follow up in 1 weeks. Pap, cultures, GBS, Tdap,  2x week testing for CHTN  Joseff Luckman H. 04/24/2013

## 2013-04-24 NOTE — Progress Notes (Signed)
P= 88 Pt. Reports intermittent lower abdominal/pelvic pressure.  New OB packet given to patient.  Pt. To get tdap vaccine today.

## 2013-04-24 NOTE — Progress Notes (Signed)
Nutrition note: 1st visit consult Pt has gained 25.3# @ [redacted]w[redacted]d, which is > expected. Pt reports eating 3 meals & 2-3 snacks/d. Pt is taking PNV. Pt reports some heartburn but no N/V. Pt is allergic to shellfish. Pt received verbal & written education on general nutrition during pregnancy. Discussed tips to decrease heartburn. Discussed wt gain goals of 15-25# or 0.6#/wk. Pt agrees to continue taking PNV and monitor her portion sizes. Pt does not have WIC but plans to apply. Pt plans to BF. F/u if referred Blondell Reveal, MS, RD, LDN

## 2013-04-25 LAB — OBSTETRIC PANEL
Antibody Screen: NEGATIVE
Basophils Absolute: 0 10*3/uL (ref 0.0–0.1)
Basophils Relative: 0 % (ref 0–1)
Eosinophils Relative: 1 % (ref 0–5)
HCT: 28.8 % — ABNORMAL LOW (ref 36.0–46.0)
Hemoglobin: 9.9 g/dL — ABNORMAL LOW (ref 12.0–15.0)
Hepatitis B Surface Ag: NEGATIVE
Lymphocytes Relative: 27 % (ref 12–46)
Lymphs Abs: 1.2 10*3/uL (ref 0.7–4.0)
MCHC: 34.4 g/dL (ref 30.0–36.0)
Monocytes Absolute: 0.5 10*3/uL (ref 0.1–1.0)
Monocytes Relative: 12 % (ref 3–12)
Neutro Abs: 2.6 10*3/uL (ref 1.7–7.7)
Neutrophils Relative %: 60 % (ref 43–77)
Platelets: 194 10*3/uL (ref 150–400)
RDW: 14.6 % (ref 11.5–15.5)
Rubella: 11.5 Index — ABNORMAL HIGH (ref <0.90)
WBC: 4.4 10*3/uL (ref 4.0–10.5)

## 2013-04-25 LAB — HIV ANTIBODY (ROUTINE TESTING W REFLEX): HIV: NONREACTIVE

## 2013-04-25 LAB — ALCOHOL METABOLITE (ETG), URINE: Ethyl Glucuronide (EtG): NEGATIVE ng/mL

## 2013-04-25 LAB — GC/CHLAMYDIA PROBE AMP: CT Probe RNA: NEGATIVE

## 2013-04-26 LAB — PRESCRIPTION MONITORING PROFILE (19 PANEL)
Amphetamine/Meth: NEGATIVE ng/mL
Benzodiazepine Screen, Urine: NEGATIVE ng/mL
Carisoprodol, Urine: NEGATIVE ng/mL
MDMA URINE: NEGATIVE ng/mL
Meperidine, Ur: NEGATIVE ng/mL
Methadone Screen, Urine: NEGATIVE ng/mL
Nitrites, Initial: NEGATIVE ug/mL
Propoxyphene: NEGATIVE ng/mL
Tapentadol, urine: NEGATIVE ng/mL
Tramadol Scrn, Ur: NEGATIVE ng/mL
Zolpidem, Urine: NEGATIVE ng/mL
pH, Initial: 7.3 pH (ref 4.5–8.9)

## 2013-04-27 DIAGNOSIS — O0990 Supervision of high risk pregnancy, unspecified, unspecified trimester: Secondary | ICD-10-CM | POA: Insufficient documentation

## 2013-04-27 DIAGNOSIS — O10919 Unspecified pre-existing hypertension complicating pregnancy, unspecified trimester: Secondary | ICD-10-CM | POA: Insufficient documentation

## 2013-04-27 LAB — CULTURE, BETA STREP (GROUP B ONLY)

## 2013-04-28 ENCOUNTER — Ambulatory Visit (HOSPITAL_COMMUNITY)
Admission: RE | Admit: 2013-04-28 | Discharge: 2013-04-28 | Disposition: A | Discharge status: Home / Self Care | Payer: Medicaid Other | Source: Ambulatory Visit | Attending: Obstetrics & Gynecology | Admitting: Obstetrics & Gynecology

## 2013-04-28 ENCOUNTER — Other Ambulatory Visit: Payer: Medicaid Other

## 2013-04-28 ENCOUNTER — Ambulatory Visit (HOSPITAL_COMMUNITY): Admission: RE | Admit: 2013-04-28 | Payer: Self-pay | Source: Ambulatory Visit

## 2013-04-28 ENCOUNTER — Other Ambulatory Visit: Payer: Self-pay | Admitting: Obstetrics & Gynecology

## 2013-04-28 DIAGNOSIS — Z3689 Encounter for other specified antenatal screening: Secondary | ICD-10-CM | POA: Insufficient documentation

## 2013-04-28 DIAGNOSIS — O10013 Pre-existing essential hypertension complicating pregnancy, third trimester: Secondary | ICD-10-CM

## 2013-04-28 DIAGNOSIS — O10019 Pre-existing essential hypertension complicating pregnancy, unspecified trimester: Secondary | ICD-10-CM | POA: Insufficient documentation

## 2013-04-28 DIAGNOSIS — Z3493 Encounter for supervision of normal pregnancy, unspecified, third trimester: Secondary | ICD-10-CM

## 2013-04-28 DIAGNOSIS — O99330 Smoking (tobacco) complicating pregnancy, unspecified trimester: Secondary | ICD-10-CM | POA: Insufficient documentation

## 2013-04-28 DIAGNOSIS — Z23 Encounter for immunization: Secondary | ICD-10-CM

## 2013-04-28 DIAGNOSIS — O163 Unspecified maternal hypertension, third trimester: Secondary | ICD-10-CM

## 2013-04-28 DIAGNOSIS — O09523 Supervision of elderly multigravida, third trimester: Secondary | ICD-10-CM

## 2013-04-28 DIAGNOSIS — O0930 Supervision of pregnancy with insufficient antenatal care, unspecified trimester: Secondary | ICD-10-CM | POA: Insufficient documentation

## 2013-04-29 ENCOUNTER — Encounter: Payer: Self-pay | Admitting: Obstetrics & Gynecology

## 2013-04-29 DIAGNOSIS — N83201 Unspecified ovarian cyst, right side: Secondary | ICD-10-CM | POA: Insufficient documentation

## 2013-05-01 ENCOUNTER — Encounter: Payer: Self-pay | Admitting: *Deleted

## 2013-05-01 ENCOUNTER — Encounter: Payer: Self-pay | Admitting: Obstetrics and Gynecology

## 2013-05-01 ENCOUNTER — Ambulatory Visit (INDEPENDENT_AMBULATORY_CARE_PROVIDER_SITE_OTHER): Payer: Self-pay | Admitting: Obstetrics and Gynecology

## 2013-05-01 VITALS — BP 129/81 | Wt 204.6 lb

## 2013-05-01 DIAGNOSIS — O139 Gestational [pregnancy-induced] hypertension without significant proteinuria, unspecified trimester: Secondary | ICD-10-CM

## 2013-05-01 DIAGNOSIS — O163 Unspecified maternal hypertension, third trimester: Secondary | ICD-10-CM

## 2013-05-01 DIAGNOSIS — O09523 Supervision of elderly multigravida, third trimester: Secondary | ICD-10-CM

## 2013-05-01 DIAGNOSIS — O09529 Supervision of elderly multigravida, unspecified trimester: Secondary | ICD-10-CM

## 2013-05-01 LAB — POCT URINALYSIS DIP (DEVICE)
Hgb urine dipstick: NEGATIVE
Protein, ur: NEGATIVE mg/dL
Specific Gravity, Urine: 1.015 (ref 1.005–1.030)
Urobilinogen, UA: 0.2 mg/dL (ref 0.0–1.0)

## 2013-05-01 NOTE — Addendum Note (Signed)
Addended by: Jill Side on: 05/01/2013 02:55 PM   Modules accepted: Orders

## 2013-05-01 NOTE — Progress Notes (Signed)
P - 81 

## 2013-05-01 NOTE — Progress Notes (Signed)
NST reviewed and reactive. FM/labor precautions reviewed. Ultrasound report reviewed. Patient with excessive weight gain- patient admits to overindulging.

## 2013-05-05 ENCOUNTER — Ambulatory Visit (INDEPENDENT_AMBULATORY_CARE_PROVIDER_SITE_OTHER): Payer: Self-pay | Admitting: *Deleted

## 2013-05-05 VITALS — BP 148/92

## 2013-05-05 DIAGNOSIS — O139 Gestational [pregnancy-induced] hypertension without significant proteinuria, unspecified trimester: Secondary | ICD-10-CM

## 2013-05-05 DIAGNOSIS — O163 Unspecified maternal hypertension, third trimester: Secondary | ICD-10-CM

## 2013-05-05 LAB — COMPREHENSIVE METABOLIC PANEL
Albumin: 2.9 g/dL — ABNORMAL LOW (ref 3.5–5.2)
BUN: 3 mg/dL — ABNORMAL LOW (ref 6–23)
CO2: 22 mEq/L (ref 19–32)
Calcium: 8.2 mg/dL — ABNORMAL LOW (ref 8.4–10.5)
Chloride: 107 mEq/L (ref 96–112)
Creat: 0.42 mg/dL — ABNORMAL LOW (ref 0.50–1.10)
Glucose, Bld: 81 mg/dL (ref 70–99)
Potassium: 3.6 mEq/L (ref 3.5–5.3)
Total Bilirubin: 0.3 mg/dL (ref 0.3–1.2)

## 2013-05-05 LAB — CBC
HCT: 28.7 % — ABNORMAL LOW (ref 36.0–46.0)
MCH: 32 pg (ref 26.0–34.0)
MCHC: 34.5 g/dL (ref 30.0–36.0)
MCV: 92.9 fL (ref 78.0–100.0)
Platelets: 189 10*3/uL (ref 150–400)
WBC: 4.8 10*3/uL (ref 4.0–10.5)

## 2013-05-05 NOTE — Progress Notes (Signed)
P = 80   Pt denies H/A or visual disturbances.  Baseline labs not yet done- CBC, CMET drawn today.  Pt is unable to collect 24 hr urine due to work schedule this week- random urine obtained today for protein/creatinine ratio. Pt scheduled to return in 3 days.

## 2013-05-05 NOTE — Progress Notes (Signed)
NST reviewed and reactive.  

## 2013-05-06 LAB — PROTEIN / CREATININE RATIO, URINE
Creatinine, Urine: 112.8 mg/dL
Protein Creatinine Ratio: 0.13 (ref <0.15)
Total Protein, Urine: 15 mg/dL

## 2013-05-08 ENCOUNTER — Ambulatory Visit (INDEPENDENT_AMBULATORY_CARE_PROVIDER_SITE_OTHER): Payer: Self-pay | Admitting: Obstetrics and Gynecology

## 2013-05-08 ENCOUNTER — Encounter: Payer: Self-pay | Admitting: Obstetrics and Gynecology

## 2013-05-08 ENCOUNTER — Encounter: Payer: Self-pay | Admitting: *Deleted

## 2013-05-08 VITALS — BP 151/86 | Wt 208.1 lb

## 2013-05-08 DIAGNOSIS — O139 Gestational [pregnancy-induced] hypertension without significant proteinuria, unspecified trimester: Secondary | ICD-10-CM

## 2013-05-08 DIAGNOSIS — O10013 Pre-existing essential hypertension complicating pregnancy, third trimester: Secondary | ICD-10-CM

## 2013-05-08 DIAGNOSIS — O163 Unspecified maternal hypertension, third trimester: Secondary | ICD-10-CM

## 2013-05-08 DIAGNOSIS — O09529 Supervision of elderly multigravida, unspecified trimester: Secondary | ICD-10-CM

## 2013-05-08 DIAGNOSIS — O10019 Pre-existing essential hypertension complicating pregnancy, unspecified trimester: Secondary | ICD-10-CM

## 2013-05-08 DIAGNOSIS — O09523 Supervision of elderly multigravida, third trimester: Secondary | ICD-10-CM

## 2013-05-08 DIAGNOSIS — Z3480 Encounter for supervision of other normal pregnancy, unspecified trimester: Secondary | ICD-10-CM

## 2013-05-08 DIAGNOSIS — Z3493 Encounter for supervision of normal pregnancy, unspecified, third trimester: Secondary | ICD-10-CM

## 2013-05-08 DIAGNOSIS — Z23 Encounter for immunization: Secondary | ICD-10-CM

## 2013-05-08 LAB — POCT URINALYSIS DIP (DEVICE)
Bilirubin Urine: NEGATIVE
Glucose, UA: NEGATIVE mg/dL
Hgb urine dipstick: NEGATIVE
Ketones, ur: NEGATIVE mg/dL
Nitrite: NEGATIVE
Protein, ur: NEGATIVE mg/dL
Specific Gravity, Urine: 1.025 (ref 1.005–1.030)
Urobilinogen, UA: 0.2 mg/dL (ref 0.0–1.0)

## 2013-05-08 MED ORDER — TRIAMCINOLONE ACETONIDE 0.5 % EX CREA: TOPICAL_CREAM | Freq: Once | CUTANEOUS | Status: DC

## 2013-05-08 NOTE — Progress Notes (Signed)
NST reviewed and reactive. Patient doing well without complaints, ready to be postpartum. FM/labor/preeclamsia precautions reviewed. Patient remains undecided on contraception, information provided

## 2013-05-08 NOTE — Patient Instructions (Signed)
Contraception Choices °Birth control (contraception) is the use of any methods or devices to stop pregnancy from happening. Below are some methods to help avoid pregnancy. °HORMONAL BIRTH CONTROL °· A small tube put under the skin of the upper arm (implant). The tube can stay in place for 3 years. The implant must be taken out after 3 years. °· Shots given every 3 months. °· Pills taken every day. °· Patches that are changed once a week. °· A ring put into the vagina (vaginal ring). The ring is left in place for 3 weeks and removed for 1 week. Then, a new ring is put in the vagina. °· Emergency birth control pills taken after unprotected sex (intercourse). °BARRIER BIRTH CONTROL  °· A thin covering worn on the penis (female condom) during sex. °· A soft, loose covering put into the vagina (female condom) before sex. °· A rubber bowl that sits over the cervix (diaphragm). The bowl must be made for you. The bowl is put into the vagina before sex. The bowl is left in place for 6 to 8 hours after sex. °· A small, soft cup that fits over the cervix (cervical cap). The cup must be made for you. The cup can be left in place for 48 hours after sex. °· A sponge that is put into the vagina before sex. °· A chemical that kills or stops sperm from getting into the cervix and uterus (spermicide). The chemical may be a cream, jelly, foam, or pill. °INTRAUTERINE (IUD) BIRTH CONTROL  °· IUD birth control is a small, T-shaped piece of plastic. The plastic is put inside the uterus. There are 2 types of IUD: °· Copper IUD. The IUD is covered in copper wire. The copper makes a fluid that kills sperm. It can stay in place for 10 years. °· Hormone IUD. The hormone stops pregnancy from happening. It can stay in place for 5 years. °PERMANENT METHODS °· When the woman has her fallopian tubes sealed, tied, or blocked during surgery. This stops the egg from traveling to the uterus. °· The doctor places a small coil or insert into each fallopian  tube. This causes scar tissue to form and blocks the fallopian tubes. °· When the female has the tubes that carry sperm tied off (vasectomy). °NATURAL FAMILY PLANNING BIRTH CONTROL  °· Natural family planning means not having sex or using barrier birth control on the days the woman could become pregnant. °· Use a calendar to keep track of the length of each period and know the days she can get pregnant. °· Avoid sex during ovulation. °· Use a thermometer to measure body temperature. Also watch for symptoms of ovulation. °· Time sex to be after the woman has ovulated. °Use condoms to help protect yourself against sexually transmitted infections (STIs). Do this no matter what type of birth control you use. Talk to your doctor about which type of birth control is best for you. °Document Released: 04/02/2009 Document Revised: 02/05/2013 Document Reviewed: 12/25/2012 °[employer]® Patient Information ©2014 [employer]. ° °

## 2013-05-08 NOTE — Progress Notes (Signed)
P = 83    Pt states she is feeling anxious and "crabby".  She denies H/A or visual changes. Pt requests refill of Triamcinolone cream.

## 2013-05-12 ENCOUNTER — Ambulatory Visit (HOSPITAL_COMMUNITY)
Admission: RE | Admit: 2013-05-12 | Discharge: 2013-05-12 | Disposition: A | Discharge status: Home / Self Care | Payer: Medicaid Other | Source: Ambulatory Visit | Attending: Obstetrics and Gynecology | Admitting: Obstetrics and Gynecology

## 2013-05-12 ENCOUNTER — Ambulatory Visit (INDEPENDENT_AMBULATORY_CARE_PROVIDER_SITE_OTHER): Payer: Self-pay | Admitting: *Deleted

## 2013-05-12 ENCOUNTER — Other Ambulatory Visit: Payer: Self-pay | Admitting: Obstetrics and Gynecology

## 2013-05-12 VITALS — BP 142/81 | Wt 207.9 lb

## 2013-05-12 DIAGNOSIS — O163 Unspecified maternal hypertension, third trimester: Secondary | ICD-10-CM

## 2013-05-12 DIAGNOSIS — O10019 Pre-existing essential hypertension complicating pregnancy, unspecified trimester: Secondary | ICD-10-CM | POA: Insufficient documentation

## 2013-05-12 DIAGNOSIS — O169 Unspecified maternal hypertension, unspecified trimester: Secondary | ICD-10-CM

## 2013-05-12 DIAGNOSIS — O99330 Smoking (tobacco) complicating pregnancy, unspecified trimester: Secondary | ICD-10-CM | POA: Insufficient documentation

## 2013-05-12 DIAGNOSIS — O0930 Supervision of pregnancy with insufficient antenatal care, unspecified trimester: Secondary | ICD-10-CM | POA: Insufficient documentation

## 2013-05-12 NOTE — Progress Notes (Signed)
P= NST

## 2013-05-14 NOTE — Progress Notes (Signed)
11/24 NST reviewed and reactive 

## 2013-05-19 ENCOUNTER — Ambulatory Visit (INDEPENDENT_AMBULATORY_CARE_PROVIDER_SITE_OTHER): Payer: Self-pay | Admitting: *Deleted

## 2013-05-19 VITALS — BP 141/94

## 2013-05-19 DIAGNOSIS — O163 Unspecified maternal hypertension, third trimester: Secondary | ICD-10-CM

## 2013-05-19 DIAGNOSIS — O139 Gestational [pregnancy-induced] hypertension without significant proteinuria, unspecified trimester: Secondary | ICD-10-CM

## 2013-05-19 NOTE — Progress Notes (Signed)
NST performed today was reviewed and was found to be reactive.  Normal AFI at 12.2 cm.  Continue recommended antenatal testing and prenatal care.

## 2013-05-19 NOTE — Progress Notes (Signed)
P = 80   Pt denies H/A or visual disturbances.   IOL scheduled 12/8 @ 0630.

## 2013-05-20 ENCOUNTER — Telehealth (HOSPITAL_COMMUNITY): Payer: Self-pay | Admitting: *Deleted

## 2013-05-20 NOTE — Telephone Encounter (Signed)
Preadmission screen  

## 2013-05-22 ENCOUNTER — Ambulatory Visit (INDEPENDENT_AMBULATORY_CARE_PROVIDER_SITE_OTHER): Payer: Self-pay | Admitting: Obstetrics & Gynecology

## 2013-05-22 ENCOUNTER — Encounter: Payer: Self-pay | Admitting: *Deleted

## 2013-05-22 VITALS — BP 151/87 | Wt 210.7 lb

## 2013-05-22 DIAGNOSIS — O10019 Pre-existing essential hypertension complicating pregnancy, unspecified trimester: Secondary | ICD-10-CM

## 2013-05-22 DIAGNOSIS — O09529 Supervision of elderly multigravida, unspecified trimester: Secondary | ICD-10-CM

## 2013-05-22 DIAGNOSIS — R8271 Bacteriuria: Secondary | ICD-10-CM

## 2013-05-22 DIAGNOSIS — O139 Gestational [pregnancy-induced] hypertension without significant proteinuria, unspecified trimester: Secondary | ICD-10-CM

## 2013-05-22 DIAGNOSIS — O9989 Other specified diseases and conditions complicating pregnancy, childbirth and the puerperium: Secondary | ICD-10-CM

## 2013-05-22 DIAGNOSIS — O10913 Unspecified pre-existing hypertension complicating pregnancy, third trimester: Secondary | ICD-10-CM

## 2013-05-22 LAB — POCT URINALYSIS DIP (DEVICE)
Glucose, UA: NEGATIVE mg/dL
Hgb urine dipstick: NEGATIVE
Ketones, ur: NEGATIVE mg/dL
Nitrite: NEGATIVE
Protein, ur: 30 mg/dL — AB
Specific Gravity, Urine: 1.015 (ref 1.005–1.030)
Urobilinogen, UA: 1 mg/dL (ref 0.0–1.0)
pH: 7 (ref 5.0–8.0)

## 2013-05-22 NOTE — Patient Instructions (Signed)
Labor Induction  Labor induction is when steps are taken to cause a pregnant woman to begin the labor process. Most women go into labor on their own between 37 weeks and 42 weeks of the pregnancy. When this does not happen or when there is a medical need, methods may be used to induce labor. Labor induction causes a pregnant woman's uterus to contract. It also causes the cervix to soften (ripen), open (dilate), and thin out (efface). Usually, labor is not induced before 39 weeks of the pregnancy unless there is a problem with the baby or mother.  Before inducing labor, your health care provider will consider a number of factors, including the following:  The medical condition of you and the baby.   How many weeks along you are.   The status of the baby's lung maturity.   The condition of the cervix.   The position of the baby.  WHAT ARE THE REASONS FOR LABOR INDUCTION? Labor may be induced for the following reasons:  The health of the baby or mother is at risk.   The pregnancy is overdue by 1 week or more.   The water breaks but labor does not start on its own.   The mother has a health condition or serious illness, such as high blood pressure, infection, placental abruption, or diabetes.  The amniotic fluid amounts are low around the baby.   The baby is distressed.  Convenience or wanting the baby to be born on a certain date is not a reason for inducing labor. WHAT METHODS ARE USED FOR LABOR INDUCTION? Several methods of labor induction may be used, such as:   Prostaglandin medicine. This medicine causes the cervix to dilate and ripen. The medicine will also start contractions. It can be taken by mouth or by inserting a suppository into the vagina.   Inserting a thin tube (catheter) with a balloon on the end into the vagina to dilate the cervix. Once inserted, the balloon is expanded with water, which causes the cervix to open.   Stripping the membranes. Your health  care provider separates amniotic sac tissue from the cervix, causing the cervix to be stretched and causing the release of a hormone called progesterone. This may cause the uterus to contract. It is often done during an office visit. You will be sent home to wait for the contractions to begin. You will then come in for an induction.   Breaking the water. Your health care provider makes a hole in the amniotic sac using a small instrument. Once the amniotic sac breaks, contractions should begin. This may still take hours to see an effect.   Medicine to trigger or strengthen contractions. This medicine is given through an IV access tube inserted into a vein in your arm.  All of the methods of induction, besides stripping the membranes, will be done in the hospital. Induction is done in the hospital so that you and the baby can be carefully monitored.  HOW LONG DOES IT TAKE FOR LABOR TO BE INDUCED? Some inductions can take up to 2 3 days. Depending on the cervix, it usually takes less time. It takes longer when you are induced early in the pregnancy or if this is your first pregnancy. If a mother is still pregnant and the induction has been going on for 2 3 days, either the mother will be sent home or a cesarean delivery will be needed. WHAT ARE THE RISKS ASSOCIATED WITH LABOR INDUCTION? Some of the risks   of induction include:   Changes in fetal heart rate, such as too high, too low, or erratic.   Fetal distress.   Chance of infection for the mother and baby.   Increased chance of having a cesarean delivery.   Breaking off (abruption) of the placenta from the uterus (rare).   Uterine rupture (very rare).  When induction is needed for medical reasons, the benefits of induction may outweigh the risks. WHAT ARE SOME REASONS FOR NOT INDUCING LABOR? Labor induction should not be done if:   It is shown that your baby does not tolerate labor.   You have had previous surgeries on your  uterus, such as a myomectomy or the removal of fibroids.   Your placenta lies very low in the uterus and blocks the opening of the cervix (placenta previa).   Your baby is not in a head-down position.   The umbilical cord drops down into the birth canal in front of the baby. This could cut off the baby's blood and oxygen supply.   You have had a previous cesarean delivery.   There are unusual circumstances, such as the baby being extremely premature.  Document Released: 10/25/2006 Document Revised: 02/05/2013 Document Reviewed: 01/02/2013 [employer] Patient Information 2014 [employer].  

## 2013-05-22 NOTE — Progress Notes (Signed)
NST today reactive. IOL 12/8

## 2013-05-22 NOTE — Progress Notes (Signed)
P = 78     IOL scheduled 12/8.  Pt requests refill of Triamcinolone cream.

## 2013-05-26 ENCOUNTER — Inpatient Hospital Stay (HOSPITAL_COMMUNITY)
Admission: RE | Admit: 2013-05-26 | Discharge: 2013-05-29 | DRG: 774 | Disposition: A | Discharge status: Home / Self Care | Payer: Medicaid Other | Source: Ambulatory Visit | Attending: Obstetrics & Gynecology | Admitting: Obstetrics & Gynecology

## 2013-05-26 ENCOUNTER — Encounter (HOSPITAL_COMMUNITY): Payer: MEDICAID | Admitting: Anesthesiology

## 2013-05-26 ENCOUNTER — Encounter (HOSPITAL_COMMUNITY): Payer: Self-pay

## 2013-05-26 ENCOUNTER — Inpatient Hospital Stay (HOSPITAL_COMMUNITY): Payer: Medicaid Other | Admitting: Anesthesiology

## 2013-05-26 VITALS — BP 143/84 | HR 70 | Temp 97.5°F | Resp 17 | Ht 67.0 in | Wt 210.4 lb

## 2013-05-26 DIAGNOSIS — O99334 Smoking (tobacco) complicating childbirth: Secondary | ICD-10-CM | POA: Diagnosis present

## 2013-05-26 DIAGNOSIS — O0992 Supervision of high risk pregnancy, unspecified, second trimester: Secondary | ICD-10-CM

## 2013-05-26 DIAGNOSIS — O1002 Pre-existing essential hypertension complicating childbirth: Principal | ICD-10-CM | POA: Diagnosis present

## 2013-05-26 DIAGNOSIS — O10913 Unspecified pre-existing hypertension complicating pregnancy, third trimester: Secondary | ICD-10-CM | POA: Diagnosis present

## 2013-05-26 DIAGNOSIS — O411090 Infection of amniotic sac and membranes, unspecified, unspecified trimester, not applicable or unspecified: Secondary | ICD-10-CM | POA: Diagnosis present

## 2013-05-26 LAB — COMPREHENSIVE METABOLIC PANEL
Alkaline Phosphatase: 184 U/L — ABNORMAL HIGH (ref 39–117)
BUN: 4 mg/dL — ABNORMAL LOW (ref 6–23)
CO2: 22 mEq/L (ref 19–32)
Calcium: 8.9 mg/dL (ref 8.4–10.5)
Chloride: 105 mEq/L (ref 96–112)
Creatinine, Ser: 0.48 mg/dL — ABNORMAL LOW (ref 0.50–1.10)
GFR calc Af Amer: >90 mL/min (ref >90)
GFR calc non Af Amer: >90 mL/min (ref >90)
Glucose, Bld: 73 mg/dL (ref 70–99)
Total Protein: 7 g/dL (ref 6.0–8.3)

## 2013-05-26 LAB — CBC
HCT: 31.9 % — ABNORMAL LOW (ref 36.0–46.0)
MCH: 31.8 pg (ref 26.0–34.0)
MCHC: 34.2 g/dL (ref 30.0–36.0)
MCHC: 34.5 g/dL (ref 30.0–36.0)
MCV: 93.1 fL (ref 78.0–100.0)
MCV: 93 fL (ref 78.0–100.0)
Platelets: 192 10*3/uL (ref 150–400)
Platelets: 179 10*3/uL (ref 150–400)
RBC: 3.46 MIL/uL — ABNORMAL LOW (ref 3.87–5.11)
RDW: 14.5 % (ref 11.5–15.5)
RDW: 14.6 % (ref 11.5–15.5)
WBC: 5.9 10*3/uL (ref 4.0–10.5)

## 2013-05-26 LAB — PROTEIN / CREATININE RATIO, URINE
Creatinine, Urine: 36.36 mg/dL
Protein Creatinine Ratio: 0.2 — ABNORMAL HIGH (ref 0.00–0.15)

## 2013-05-26 MED ORDER — MISOPROSTOL 25 MCG QUARTER TABLET
25.0000 ug | ORAL_TABLET | ORAL | Status: DC | PRN
  Administered 2013-05-26 (×2): 25 ug via VAGINAL
  Filled 2013-05-26 (×2): qty 0.25

## 2013-05-26 MED ORDER — FENTANYL 2.5 MCG/ML BUPIVACAINE 1/10 % EPIDURAL INFUSION (WH - ANES)
14.0000 mL/h | INTRAMUSCULAR | Status: DC | PRN
  Administered 2013-05-26 – 2013-05-27 (×3): 14 mL/h via EPIDURAL
  Filled 2013-05-26 (×3): qty 125

## 2013-05-26 MED ORDER — LACTATED RINGERS IV SOLN
500.0000 mL | INTRAVENOUS | Status: DC | PRN
  Administered 2013-05-27: 1000 mL via INTRAVENOUS
  Administered 2013-05-27: 500 mL via INTRAVENOUS

## 2013-05-26 MED ORDER — OXYTOCIN BOLUS FROM INFUSION: 500.0000 mL | INTRAVENOUS | Status: DC

## 2013-05-26 MED ORDER — LABETALOL HCL 5 MG/ML IV SOLN
10.0000 mg | Freq: Once | INTRAVENOUS | Status: AC
  Administered 2013-05-26: 10 mg via INTRAVENOUS
  Filled 2013-05-26: qty 4

## 2013-05-26 MED ORDER — LACTATED RINGERS IV SOLN
INTRAVENOUS | Status: DC
  Administered 2013-05-26 – 2013-05-27 (×6): via INTRAVENOUS

## 2013-05-26 MED ORDER — PHENYLEPHRINE 40 MCG/ML (10ML) SYRINGE FOR IV PUSH (FOR BLOOD PRESSURE SUPPORT)
80.0000 ug | PREFILLED_SYRINGE | INTRAVENOUS | Status: DC | PRN
  Filled 2013-05-26: qty 10

## 2013-05-26 MED ORDER — OXYTOCIN 40 UNITS IN LACTATED RINGERS INFUSION - SIMPLE MED
62.5000 mL/h | INTRAVENOUS | Status: DC
  Administered 2013-05-27: 62.5 mL/h via INTRAVENOUS
  Filled 2013-05-26: qty 1000

## 2013-05-26 MED ORDER — LACTATED RINGERS IV SOLN
500.0000 mL | Freq: Once | INTRAVENOUS | Status: AC
  Administered 2013-05-26: 500 mL via INTRAVENOUS

## 2013-05-26 MED ORDER — IBUPROFEN 600 MG PO TABS
600.0000 mg | ORAL_TABLET | Freq: Four times a day (QID) | ORAL | Status: DC | PRN
  Filled 2013-05-26: qty 1

## 2013-05-26 MED ORDER — ACETAMINOPHEN 325 MG PO TABS
650.0000 mg | ORAL_TABLET | ORAL | Status: DC | PRN
  Administered 2013-05-27: 650 mg via ORAL
  Filled 2013-05-26: qty 2

## 2013-05-26 MED ORDER — PHENYLEPHRINE 40 MCG/ML (10ML) SYRINGE FOR IV PUSH (FOR BLOOD PRESSURE SUPPORT): 80.0000 ug | PREFILLED_SYRINGE | INTRAVENOUS | Status: DC | PRN

## 2013-05-26 MED ORDER — EPHEDRINE 5 MG/ML INJ: 10.0000 mg | INTRAVENOUS | Status: DC | PRN

## 2013-05-26 MED ORDER — OXYCODONE-ACETAMINOPHEN 5-325 MG PO TABS: 1.0000 | ORAL_TABLET | ORAL | Status: DC | PRN

## 2013-05-26 MED ORDER — DIPHENHYDRAMINE HCL 50 MG/ML IJ SOLN
12.5000 mg | INTRAMUSCULAR | Status: DC | PRN
  Administered 2013-05-27: 12.5 mg via INTRAVENOUS
  Filled 2013-05-26: qty 1

## 2013-05-26 MED ORDER — LABETALOL HCL 5 MG/ML IV SOLN
20.0000 mg | INTRAVENOUS | Status: DC | PRN
  Administered 2013-05-27: 20 mg via INTRAVENOUS
  Filled 2013-05-26: qty 4

## 2013-05-26 MED ORDER — TERBUTALINE SULFATE 1 MG/ML IJ SOLN: 0.2500 mg | Freq: Once | INTRAMUSCULAR | Status: AC | PRN

## 2013-05-26 MED ORDER — LIDOCAINE HCL (PF) 1 % IJ SOLN
30.0000 mL | INTRAMUSCULAR | Status: AC | PRN
  Administered 2013-05-27: 30 mL via SUBCUTANEOUS
  Filled 2013-05-26 (×2): qty 30

## 2013-05-26 MED ORDER — FENTANYL CITRATE 0.05 MG/ML IJ SOLN
100.0000 ug | INTRAMUSCULAR | Status: DC | PRN
  Administered 2013-05-26 (×3): 100 ug via INTRAVENOUS
  Filled 2013-05-26 (×3): qty 2

## 2013-05-26 MED ORDER — ONDANSETRON HCL 4 MG/2ML IJ SOLN: 4.0000 mg | Freq: Four times a day (QID) | INTRAMUSCULAR | Status: DC | PRN

## 2013-05-26 MED ORDER — LIDOCAINE HCL (PF) 1 % IJ SOLN
INTRAMUSCULAR | Status: DC | PRN
  Administered 2013-05-26 (×4): 4 mL

## 2013-05-26 MED ORDER — EPHEDRINE 5 MG/ML INJ
10.0000 mg | INTRAVENOUS | Status: DC | PRN
  Filled 2013-05-26: qty 4

## 2013-05-26 MED ORDER — CITRIC ACID-SODIUM CITRATE 334-500 MG/5ML PO SOLN: 30.0000 mL | ORAL | Status: DC | PRN

## 2013-05-26 NOTE — Progress Notes (Signed)
[name] [name] is a 27 y.o. G1P0 at [redacted]w[redacted]d by LMP admitted for induction of labor due to Hypertension.  Subjective: Pt is feeling contractions and w/ severe rangepressure. Administered 10mg  Labetalol x1  Objective: BP 158/92  Pulse 70  Temp(Src) 98.3 F (36.8 C) (Oral)  Resp 20  Ht 5\' 7"  (1.702 m)  Wt 210 lb 6.4 oz (95.437 kg)  BMI 32.95 kg/m2  LMP 08/19/2012    Filed Vitals:   05/26/13 1931 05/26/13 1946 05/26/13 2001 05/26/13 2016  BP: 159/114 172/110 172/98 158/92  Pulse: 61 66 63 70  Temp:      TempSrc:      Resp:   20   Height:      Weight:           FHT:  FHR: 130s bpm, variability: moderate,  accelerations:  Present,  decelerations:  Absent UC:   regular, every 4-6 minutes SVE:   Dilation: 1.5 Effacement (%): 60 Station: -3 Exam by:: Dr Ike Bene  Labs: Lab Results  Component Value Date   WBC 4.4 05/26/2013   HGB 11.0* 05/26/2013   HCT 32.2* 05/26/2013   MCV 93.1 05/26/2013   PLT 192 05/26/2013    Assessment / Plan: Induction of labor due to [facility],  progressing well on pitocin  Labor: Progressing normally and will continue cytotec Preeclampsia:  no signs or symptoms of toxicity and Labs stable. will receive labetalol 10mg  x1 now. Fetal Wellbeing:  Category I Pain Control:  Epidural and Fentanyl I/D:  n/a Anticipated MOD:  NSVD  [name] [name] [name] [name] 05/26/2013, 8:22 PM

## 2013-05-26 NOTE — Anesthesia Preprocedure Evaluation (Signed)
Anesthesia Evaluation  Patient identified by MRN, date of birth, ID band Patient awake    Reviewed: Allergy & Precautions, H&P , NPO status , Patient's Chart, lab work & pertinent test results, reviewed documented beta blocker date and time   History of Anesthesia Complications Negative for: history of anesthetic complications  Airway Mallampati: I TM Distance: >3 FB Neck ROM: full    Dental  (+) Teeth Intact   Pulmonary asthma (rare inhaler use) , Current Smoker,  allergies breath sounds clear to auscultation        Cardiovascular hypertension (CHTN), Rhythm:regular Rate:Normal     Neuro/Psych negative neurological ROS  negative psych ROS   GI/Hepatic negative GI ROS, Neg liver ROS,   Endo/Other  negative endocrine ROS  Renal/GU Renal disease (h/o urosepsis from pyelonephritis)     Musculoskeletal   Abdominal   Peds  Hematology negative hematology ROS (+)   Anesthesia Other Findings eczema  Reproductive/Obstetrics (+) Pregnancy                           Anesthesia Physical Anesthesia Plan  ASA: II  Anesthesia Plan: Epidural   Post-op Pain Management:    Induction:   Airway Management Planned:   Additional Equipment:   Intra-op Plan:   Post-operative Plan:   Informed Consent: I have reviewed the patients History and Physical, chart, labs and discussed the procedure including the risks, benefits and alternatives for the proposed anesthesia with the patient or authorized representative who has indicated his/her understanding and acceptance.     Plan Discussed with:   Anesthesia Plan Comments:         Anesthesia Quick Evaluation

## 2013-05-26 NOTE — H&P (Signed)
[name] [name] is a 27 y.o. female presenting for IOL for chronic HTN. Pt has no complaints today. Normal fetal movement, no Lof No vb, no ctx. No HA, vision changes or RUP pain.  Prenatal course has been complicated by chronic HTN with NST/AFI, and ?dermoid on Right ovary seen on [name] at 35wk. Pt also had urosepsis in the past.  History OB History   Grav Para Term Preterm Abortions TAB SAB Ect Mult Living   1              Past Medical History  Diagnosis Date  . Eczema   . Hypertension   . Seasonal allergies   . Lactose intolerance   . Asthma, allergic     uses inhaler prn - infrequently   Past Surgical History  Procedure Laterality Date  . Wisdom tooth extraction     Family History: family history includes Hypertension in her father, maternal aunt, maternal grandfather, maternal grandmother, maternal uncle, and paternal grandmother. She was adopted. Social History:  reports that she has been smoking Cigarettes.  She has been smoking about 0.25 packs per day. She has quit using smokeless tobacco. She reports that she does not drink alcohol or use illicit drugs.   Clinic HR  Dating LMP/Ultrasound:   weeks        Ultrasound consistent with LMP: Yes/No  Genetic Screen 1 Screen:                 AFP:                    Quad:                  NIPS:  Anatomic [name] nml (probable rt dermoid)  GTT Third trimester: 125  TDaP vaccine rec'd  Flu vaccine  id not recieve  GBS negative  Baby Food  breast/bottle  Contraception  undecided  Circumcision   Pediatrician   Normal pap   Prenatal Transfer Tool  Maternal Diabetes: No Genetic Screening: Not performed, late to care Maternal Ultrasounds/Referrals: Abnormal:  Findings:   Other: dermoid? On R ovary Fetal Ultrasounds or other Referrals:  None Maternal Substance Abuse:  No Significant Maternal Medications:  None Significant Maternal Lab Results:  Lab values include: Group B Strep negative Other Comments:  None  ROS As  above Dilation: 1 Effacement (%): Thick Station: -3 Exam by:: Dr. Ike Bene Blood pressure 157/93, pulse 72, temperature 97.6 F (36.4 C), temperature source Oral, resp. rate 18, height 5\' 7"  (1.702 m), weight 95.437 kg (210 lb 6.4 oz), last menstrual period 08/19/2012. Exam Physical Exam  VSS NAD CTAB no wrc RRR no mgt ND, Gravid Leopold 3300g, nontender No c/c/e Dilation: 1 Effacement (%): Thick Cervical Position: Posterior Station: -3 Presentation: Vertex Exam by:: Dr. Ike Bene  FHT130s mod var mult accels >15x15 no decels q8ctx  Prenatal labs: ABO, Rh: B/POS/-- (11/06 1153) Antibody: NEG (11/06 1153) Rubella: 11.50 (11/06 1153) RPR: NON REAC (11/06 1153)  HBsAg: NEGATIVE (11/06 1153)  HIV: NON REACTIVE (11/06 1153)  GBS: Negative (11/06 0000)   Assessment/Plan: [name] [name] is a 27 y.o. G1P0 at [redacted]w[redacted]d  here for IOL for cHTN #Labor: Bishop score is 2, will start with cytotec #Pain: May have epidural and IV pain meds PRN #FWB: Cat I #ID:  GBS neg #MOF: Breast #MOC: undecided   Sahil Milner [name] 05/26/2013, 9:28 AM

## 2013-05-26 NOTE — Progress Notes (Signed)
[name] [name] is a 27 y.o. G1P0 at [redacted]w[redacted]d by LMP admitted for induction of labor due to HTN  Subjective: Pt reports discomfort with contractions after FB placement. Deneis HA, vision changes, RUQ/epigastric pain.  Objective: BP 158/92  Pulse 70  Temp(Src) 98.3 F (36.8 C) (Oral)  Resp 20  Ht 5\' 7"  (1.702 m)  Wt 95.437 kg (210 lb 6.4 oz)  BMI 32.95 kg/m2  LMP 08/19/2012   Total I/O In: 275 [P.O.:150; I.V.:125] Out: 225 [Urine:225]  FHT:  FHR: 130 bpm, variability: moderate,  accelerations:  Present,  decelerations:  Absent UC:   irregular, every 2-5 minutes  SVE:   Dilation: 2 Effacement (%): 60 Station: -3 Exam by: Dr. Jill Alexanders Vitals:   05/26/13 1931 05/26/13 1946 05/26/13 2001 05/26/13 2016  BP: 159/114 172/110 172/98 158/92  Pulse: 61 66 63 70  Temp:      TempSrc:      Resp:   20   Height:      Weight:       Labs: Lab Results  Component Value Date   WBC 5.9 05/26/2013   HGB 11.0* 05/26/2013   HCT 31.9* 05/26/2013   MCV 93.0 05/26/2013   PLT 179 05/26/2013   Assessment / Plan: [name] [name] is a 27 y.o. G1P0 at [redacted]w[redacted]d admitted for induction of labor due to [facility]  Labor: Progressing normally, continue cytotec, FB placed at 21:00 cHTN: Pressures remain elevated; UPr:Cr 0.20; Cr, LFTs, plt wnl. No S/Sxs Fetal Wellbeing: Category I  Pain Control: Epidural I/D: GBS neg  Anticipated MOD: NSVD  [name] 05/26/2013, 11:16 PM  I have seen and examined this patient and agree with above documentation in the resident's note.   Rulon Abide, M.D. [facility] Fellow 05/26/2013 11:44 PM

## 2013-05-26 NOTE — Progress Notes (Signed)
Jaquelyne [place] is a 27 y.o. G1P0 at [redacted]w[redacted]d by LMP admitted for induction of labor due to Hypertension.  Subjective: Pt is comfortable without complaints  Objective: BP 168/104  Pulse 69  Temp(Src) 98.2 F (36.8 C) (Oral)  Resp 20  Ht 5\' 7"  (1.702 m)  Wt 95.437 kg (210 lb 6.4 oz)  BMI 32.95 kg/m2  LMP 08/19/2012    Filed Vitals:   05/26/13 0720 05/26/13 0906 05/26/13 1039 05/26/13 1401  BP:  157/93 154/92 168/104  Pulse:  72 71 69  Temp:  97.6 F (36.4 C)  98.2 F (36.8 C)  TempSrc:  Oral    Resp: 20 18 20 20   Height:      Weight:           FHT:  FHR: 130s bpm, variability: moderate,  accelerations:  Present,  decelerations:  Absent UC:   regular, every 4-6 minutes SVE:   Dilation: 1.5 Effacement (%): 60 Station: -1 Exam by:: Lorenda Peck RN  Labs: Lab Results  Component Value Date   WBC 4.4 05/26/2013   HGB 11.0* 05/26/2013   HCT 32.2* 05/26/2013   MCV 93.1 05/26/2013   PLT 192 05/26/2013    Assessment / Plan: Induction of labor due to [facility],  progressing well on pitocin  Labor: Progressing normally and will continue cytotec Preeclampsia:  no signs or symptoms of toxicity and will collect CMP and Pro to Cr given elevated blood pressures Fetal Wellbeing:  Category I Pain Control:  Epidural and Fentanyl I/D:  n/a Anticipated MOD:  NSVD  Zyler Hyson [name] 05/26/2013, 2:45 PM

## 2013-05-26 NOTE — Plan of Care (Signed)
Problem: Phase I Progression Outcomes Goal: Medical plan of care initiated within 2 hrs of admission Outcome: Completed/Met Date Met:  05/26/13 Pt 0630 induction; no plan of care by 0900.

## 2013-05-26 NOTE — Anesthesia Procedure Notes (Signed)
Epidural Patient location during procedure: OB Start time: 05/26/2013 11:35 PM  Staffing Performed by: anesthesiologist   Preanesthetic Checklist Completed: patient identified, site marked, surgical consent, pre-op evaluation, timeout performed, IV checked, risks and benefits discussed and monitors and equipment checked  Epidural Patient position: sitting Prep: site prepped and draped and DuraPrep Patient monitoring: continuous pulse ox and blood pressure Approach: midline Injection technique: LOR air  Needle:  Needle type: Tuohy  Needle gauge: 17 G Needle length: 9 cm and 9 Needle insertion depth: 7 cm Catheter type: closed end flexible Catheter size: 19 Gauge Catheter at skin depth: 12 cm Test dose: negative  Assessment Events: blood not aspirated, injection not painful, no injection resistance, negative IV test and no paresthesia  Additional Notes Discussed risk of headache, infection, bleeding, nerve injury and failed or incomplete block.  Patient voices understanding and wishes to proceed.  Epidural placed easily on first attempt.  No paresthesia.  Patient tolerated procedure well with no apparent complications.  Jasmine December, MDReason for block:procedure for pain

## 2013-05-27 ENCOUNTER — Encounter (HOSPITAL_COMMUNITY): Payer: Self-pay

## 2013-05-27 DIAGNOSIS — O411090 Infection of amniotic sac and membranes, unspecified, unspecified trimester, not applicable or unspecified: Secondary | ICD-10-CM

## 2013-05-27 DIAGNOSIS — O1002 Pre-existing essential hypertension complicating childbirth: Secondary | ICD-10-CM

## 2013-05-27 LAB — CBC
HCT: 31 % — ABNORMAL LOW (ref 36.0–46.0)
MCH: 31.9 pg (ref 26.0–34.0)
MCHC: 34.5 g/dL (ref 30.0–36.0)
MCV: 92.5 fL (ref 78.0–100.0)
Platelets: 151 10*3/uL (ref 150–400)
RDW: 14.5 % (ref 11.5–15.5)
WBC: 10.7 10*3/uL — ABNORMAL HIGH (ref 4.0–10.5)

## 2013-05-27 MED ORDER — DIBUCAINE 1 % RE OINT: 1.0000 "application " | TOPICAL_OINTMENT | RECTAL | Status: DC | PRN

## 2013-05-27 MED ORDER — LANOLIN HYDROUS EX OINT: TOPICAL_OINTMENT | CUTANEOUS | Status: DC | PRN

## 2013-05-27 MED ORDER — SIMETHICONE 80 MG PO CHEW: 80.0000 mg | CHEWABLE_TABLET | ORAL | Status: DC | PRN

## 2013-05-27 MED ORDER — ONDANSETRON HCL 4 MG PO TABS: 4.0000 mg | ORAL_TABLET | ORAL | Status: DC | PRN

## 2013-05-27 MED ORDER — ZOLPIDEM TARTRATE 5 MG PO TABS: 5.0000 mg | ORAL_TABLET | Freq: Every evening | ORAL | Status: DC | PRN

## 2013-05-27 MED ORDER — SODIUM BICARBONATE 8.4 % IV SOLN
INTRAVENOUS | Status: DC | PRN
  Administered 2013-05-27: 4 mL via EPIDURAL
  Administered 2013-05-27: 3 mL via EPIDURAL

## 2013-05-27 MED ORDER — IBUPROFEN 600 MG PO TABS
600.0000 mg | ORAL_TABLET | Freq: Four times a day (QID) | ORAL | Status: DC
  Administered 2013-05-27 – 2013-05-29 (×8): 600 mg via ORAL
  Filled 2013-05-27 (×7): qty 1

## 2013-05-27 MED ORDER — OXYTOCIN 40 UNITS IN LACTATED RINGERS INFUSION - SIMPLE MED
1.0000 m[IU]/min | INTRAVENOUS | Status: DC
  Administered 2013-05-27: 2 m[IU]/min via INTRAVENOUS

## 2013-05-27 MED ORDER — WITCH HAZEL-GLYCERIN EX PADS: 1.0000 "application " | MEDICATED_PAD | CUTANEOUS | Status: DC | PRN

## 2013-05-27 MED ORDER — SENNOSIDES-DOCUSATE SODIUM 8.6-50 MG PO TABS
2.0000 | ORAL_TABLET | ORAL | Status: DC
  Administered 2013-05-27 – 2013-05-29 (×2): 2 via ORAL
  Filled 2013-05-27 (×2): qty 2

## 2013-05-27 MED ORDER — SODIUM CHLORIDE 0.9 % IV SOLN
2.0000 g | Freq: Four times a day (QID) | INTRAVENOUS | Status: DC
  Administered 2013-05-27: 2 g via INTRAVENOUS
  Filled 2013-05-27 (×3): qty 2000

## 2013-05-27 MED ORDER — PRENATAL MULTIVITAMIN CH
1.0000 | ORAL_TABLET | Freq: Every day | ORAL | Status: DC
  Administered 2013-05-28: 1 via ORAL
  Filled 2013-05-27: qty 1

## 2013-05-27 MED ORDER — PNEUMOCOCCAL VAC POLYVALENT 25 MCG/0.5ML IJ INJ
0.5000 mL | INJECTION | INTRAMUSCULAR | Status: AC
Start: 2013-05-28 — End: 2013-05-28
  Administered 2013-05-28: 0.5 mL via INTRAMUSCULAR
  Filled 2013-05-27: qty 0.5

## 2013-05-27 MED ORDER — ACETAMINOPHEN 650 MG RE SUPP
650.0000 mg | RECTAL | Status: DC | PRN
  Filled 2013-05-27: qty 1

## 2013-05-27 MED ORDER — GENTAMICIN SULFATE 40 MG/ML IJ SOLN
200.0000 mg | Freq: Three times a day (TID) | INTRAVENOUS | Status: DC
  Administered 2013-05-27: 200 mg via INTRAVENOUS
  Filled 2013-05-27 (×3): qty 5

## 2013-05-27 MED ORDER — BENZOCAINE-MENTHOL 20-0.5 % EX AERO
1.0000 "application " | INHALATION_SPRAY | CUTANEOUS | Status: DC | PRN
  Administered 2013-05-27: 1 via TOPICAL
  Filled 2013-05-27: qty 56

## 2013-05-27 MED ORDER — DIPHENHYDRAMINE HCL 25 MG PO CAPS: 25.0000 mg | ORAL_CAPSULE | Freq: Four times a day (QID) | ORAL | Status: DC | PRN

## 2013-05-27 MED ORDER — TERBUTALINE SULFATE 1 MG/ML IJ SOLN: 0.2500 mg | Freq: Once | INTRAMUSCULAR | Status: DC | PRN

## 2013-05-27 MED ORDER — ONDANSETRON HCL 4 MG/2ML IJ SOLN: 4.0000 mg | INTRAMUSCULAR | Status: DC | PRN

## 2013-05-27 MED ORDER — OXYCODONE-ACETAMINOPHEN 5-325 MG PO TABS
1.0000 | ORAL_TABLET | ORAL | Status: DC | PRN
  Administered 2013-05-27 – 2013-05-28 (×4): 1 via ORAL
  Administered 2013-05-29: 2 via ORAL
  Filled 2013-05-27 (×2): qty 1
  Filled 2013-05-27 (×2): qty 2
  Filled 2013-05-27: qty 1

## 2013-05-27 NOTE — Progress Notes (Signed)
Initial assessment of perineum revealed a walnut sized hematoma involving the left labia minor. Left labial laceration not approximated along approximately 2 cms.length proximal to the hematoma and seeping sanguinous drainage.

## 2013-05-27 NOTE — Progress Notes (Signed)
ANTIBIOTIC CONSULT NOTE - INITIAL  Pharmacy Consult for Gentamicin Indication: Increased temp during IOL  Allergies  Allergen Reactions  . Shellfish Allergy Other (See Comments)    Stomach upset  . Sulfa Antibiotics Swelling    Patient Measurements: Height: 5\' 7"  (170.2 cm) Weight: 210 lb 6.4 oz (95.437 kg) IBW/kg (Calculated) : 61.6 Adjusted Body Weight: 71.8kg  Vital Signs: Temp: 102.7 F (39.3 C) (12/09 0908) Temp src: Axillary (12/09 0908) BP: 166/98 mmHg (12/09 0931) Pulse Rate: 98 (12/09 0931)  Labs:  Recent Labs  05/26/13 0655 05/26/13 1630 05/26/13 1640 05/26/13 2236  WBC 4.4  --   --  5.9  HGB 11.0*  --   --  11.0*  PLT 192  --   --  179  LABCREA  --  36.36  --   --   CREATININE  --   --  0.48*  --    No results found for this basename: GENTTROUGH, GENTPEAK, GENTRANDOM,  in the last 72 hours   Microbiology: Recent Results (from the past 720 hour(s))  CULTURE, OB URINE     Status: None   Collection Time    05/01/13  3:44 PM      Result Value Range Status   Culture     Final   Value: 25,000 COLONIES/ML ESCHERICHIA COLI     50,000 COLONIES/ML ENTEROCOCCUS SPECIES   Comment: NO GROUP B STREP (S.AGALACTIAE) ISOLATED     Culture based screening of vaginal/anorectal swabs at     35 to [redacted] weeks gestation is required to rule out the     carriage of Group B Streptococcus.   Organism ID, Bacteria ESCHERICHIA COLI   Final   Organism ID, Bacteria ENTEROCOCCUS SPECIES   Final    Medications:  Ampicillin 2mg  IV Q6 hours Routine L & D orders  Assessment: 27 y.o. female G1P0 at [redacted]w[redacted]d  Estimated Ke = 0.503 hr-1, Vd = 0.4L/Kg  Goal of Therapy:  Gentamicin peak 6-8 mg/L and Trough < 1 mg/L  Plan:  Gentamicin 200 mg IV every 8 hrs  Will check gentamicin levels and/or Scr if continued > 72hr or clinically indicated.  Abygayle Deltoro N 05/27/2013,9:46 AM

## 2013-05-27 NOTE — Progress Notes (Signed)
Farrell [place] is a 27 y.o. G1P0 at [redacted]w[redacted]d by LMP admitted for IOL for cHTN  Subjective: Pt comfortable, cold. Denies HA, vision changes.   Objective: BP 156/87  Pulse 80  Temp(Src) 98.6 F (37 C) (Oral)  Resp 20  Ht 5\' 7"  (1.702 m)  Wt 95.437 kg (210 lb 6.4 oz)  BMI 32.95 kg/m2  SpO2 100%  LMP 08/19/2012   Total I/O In: 843.8 [P.O.:350; I.V.:493.8] Out: 475 [Urine:475]  FHT:  FHR: 140 bpm, variability: moderate,  accelerations:  Present,  decelerations:  Absent UC:   irregular, every 2-4 minutes SVE:   Dilation: 10 Effacement (%): 90 Station: +1 Exam by: Dr. Jarvis Newcomer With BBOW  Labs: Lab Results  Component Value Date   WBC 5.9 05/26/2013   HGB 11.0* 05/26/2013   HCT 31.9* 05/26/2013   MCV 93.0 05/26/2013   PLT 179 05/26/2013   Assessment / Plan: Annete Ayuso is a 27 y.o. G1P0 at [redacted]w[redacted]d admitted for induction of labor due to [facility]   Labor: Progressing well on pitocin s/p AROM of BBOW without difficulty CHTN: BPs improved on labetalol prn Fetal Wellbeing: Category I  Pain Control: Epidural  I/D: GBS neg  Anticipated MOD: NSVD  [name] 05/27/2013, 6:50 AM

## 2013-05-27 NOTE — Progress Notes (Signed)
Patient ID: [name] [name], female   DOB: 09-16-1985, 27 y.o.   MRN: [id number] 2 hr postpartum NSVD CTSP due to left labial swelling Exam: Tender firm 3-4 cm mass lower left labium majora over abraded area   A: Apparent early labial hematoma  P: Ice and pressure. Recheck 1-2 hr.  Danae Orleans, CNM 05/27/2013 3:16 PM

## 2013-05-27 NOTE — Progress Notes (Addendum)
Patient ID: [name] [name], female   DOB: 1985-08-13, 27 y.o.   MRN: [id number] [name] [name] is a 27 y.o. G1P0 at [redacted]w[redacted]d admitted for IOL indicated by [facility]  Subjective: Comfortable since epidural redosed; aware of UCs Had oral acetaminophen.  Amp/Gent given.   Objective: BP 125/68  Pulse 104  Temp(Src) 101.4 F (38.6 C) (Axillary)  Resp 20  Ht 5\' 7"  (1.702 m)  Wt 95.437 kg (210 lb 6.4 oz)  BMI 32.95 kg/m2  SpO2 97%  LMP 08/19/2012 Filed Vitals:   05/27/13 1101  BP: 135/82  Pulse: 108  Temp:   Resp: 20   Fetal Heart FHR: 160-165 bpm, variability: moderate,  accelerations:  Present,  decelerations:  Absent   Contractions: tracing poorly. q 2.5-3 min, pitocin at 10 mu  SVE:   Dilation: 10 Effacement (%): 90 Station: +1 Exam by:: Larose Kells RN VE: C/C/ +2 OA, clear AF  Results for orders placed during the hospital encounter of 05/26/13 (from the past 24 hour(s))  PROTEIN / CREATININE RATIO, URINE     Status: Abnormal   Collection Time    05/26/13  4:30 PM      Result Value Range   Creatinine, Urine 36.36     Total Protein, Urine 7.3     PROTEIN CREATININE RATIO 0.20 (*) 0.00 - 0.15  COMPREHENSIVE METABOLIC PANEL     Status: Abnormal   Collection Time    05/26/13  4:40 PM      Result Value Range   Sodium 138  135 - 145 mEq/L   Potassium 4.0  3.5 - 5.1 mEq/L   Chloride 105  96 - 112 mEq/L   CO2 22  19 - 32 mEq/L   Glucose, Bld 73  70 - 99 mg/dL   BUN 4 (*) 6 - 23 mg/dL   Creatinine, Ser 8.29 (*) 0.50 - 1.10 mg/dL   Calcium 8.9  8.4 - 56.2 mg/dL   Total Protein 7.0  6.0 - 8.3 g/dL   Albumin 2.6 (*) 3.5 - 5.2 g/dL   AST 18  0 - 37 U/L   ALT 15  0 - 35 U/L   Alkaline Phosphatase 184 (*) 39 - 117 U/L   Total Bilirubin 0.3  0.3 - 1.2 mg/dL   GFR calc non Af Amer >90  >90 mL/min   GFR calc Af Amer >90  >90 mL/min  CBC     Status: Abnormal   Collection Time    05/26/13 10:36 PM      Result Value Range   WBC 5.9  4.0 - 10.5 K/uL   RBC 3.43 (*) 3.87 - 5.11 MIL/uL    Hemoglobin 11.0 (*) 12.0 - 15.0 g/dL   HCT 13.0 (*) 86.5 - 78.4 %   MCV 93.0  78.0 - 100.0 fL   MCH 32.1  26.0 - 34.0 pg   MCHC 34.5  30.0 - 36.0 g/dL   RDW 69.6  29.5 - 28.4 %   Platelets 179  150 - 400 K/uL   Assessment / Plan: CHTN, normotensive at present Labor: Slowly progressive passive 2nd stage, chorioamnionitis being treated> start pushing Fetal Wellbeing: Cat 2 Pain Control:  adequate Expected mode of delivery: NSVD  [name] [name] 05/27/2013, 11:29 AM

## 2013-05-27 NOTE — Progress Notes (Signed)
UR chart review completed.  

## 2013-05-27 NOTE — Progress Notes (Signed)
[name] [name] is a 27 y.o. G1P0 at [redacted]w[redacted]d by LMP admitted for induction of labor due to HTN  Subjective: Pt reports resolution of contraction pain with epidural. Deneis HA, vision changes, RUQ/epigastric pain. FB out.   Objective: BP 149/80  Pulse 68  Temp(Src) 97.5 F (36.4 C) (Oral)  Resp 20  Ht 5\' 7"  (1.702 m)  Wt 95.437 kg (210 lb 6.4 oz)  BMI 32.95 kg/m2  SpO2 100%  LMP 08/19/2012   Total I/O In: 843.8 [P.O.:350; I.V.:493.8] Out: 475 [Urine:475]  FHT: FHR: 125 bpm, variability: moderate,  accelerations:  Present,  decelerations:  Absent UC: Regular, every 2-5 minutes  SVE:   Dilation: 4.5  Effacement (%): 70 Station: -2 Exam by: Dr. Westley Chandler. Reola Calkins  Bloody show present  Filed Vitals:   05/27/13 0005 05/27/13 0010 05/27/13 0015 05/27/13 0031  BP: 136/79 135/78 135/77 149/80  Pulse: 74 75 73 68  Temp:      TempSrc:      Resp: 20   20  Height:      Weight:      SpO2: 100% 100% 100%    Labs: Lab Results  Component Value Date   WBC 5.9 05/26/2013   HGB 11.0* 05/26/2013   HCT 31.9* 05/26/2013   MCV 93.0 05/26/2013   PLT 179 05/26/2013   Assessment / Plan: [name] [name] is a 27 y.o. G1P0 at [redacted]w[redacted]d admitted for induction of labor due to [facility]  Labor: Progressing well, starting pitocin. CHTN: BPs improved; UPr:Cr 0.20; Cr, LFTs, plt wnl. No S/Sxs Fetal Wellbeing: Category I  Pain Control: Epidural I/D: GBS neg  Anticipated MOD: NSVD  [name] 05/27/2013, 1:18 AM  I have seen and examined this patient and agree with above documentation in the resident's note.   Rulon Abide, M.D. [facility] Fellow 05/27/2013 1:41 AM

## 2013-05-27 NOTE — Progress Notes (Signed)
[name] [name] is a 27 y.o. G1P0 at [redacted]w[redacted]d admitted for IOL for chronic hypertension.  Subjective: Complete at 0610 and AROM at 0700. Feeling pressure and pain with contractions despite epidural. Also having chills and feels warm to touch. Mild nausea. No headache/RUQ pain. Pushed through 3 contractions but did not make much progress.  Objective: BP 166/98  Pulse 98  Temp(Src) 102.7 F (39.3 C) (Axillary)  Resp 22  Ht 5\' 7"  (1.702 m)  Wt 95.437 kg (210 lb 6.4 oz)  BMI 32.95 kg/m2  SpO2 100%  LMP 08/19/2012 I/O last 3 completed shifts: In: 843.8 [P.O.:350; I.V.:493.8] Out: 1175 [Urine:1175]   FHT:  FHR: 152 bpm, variability: moderate,  accelerations:  Present,  decelerations:  Present variable UC:   regular, every 2-5 minutes SVE:   Dilation: 10 Effacement (%): 90 Station: +1 Exam by:: Larose Kells RN  Labs: Lab Results  Component Value Date   WBC 5.9 05/26/2013   HGB 11.0* 05/26/2013   HCT 31.9* 05/26/2013   MCV 93.0 05/26/2013   PLT 179 05/26/2013    Assessment / Plan: Induction of labor due to chronic hypertension,  progressing well on pitocin s/p AROM. Now with maternal fever.  Labor: Completely dilated with contractions still @ 2-5 minutes. Will increase pitocin and allow more time to labor and get contractions adequate before pushing. Preeclampsia:  labs stable Fetal Wellbeing:  Category II Pain Control:  Epidural I/D:  Start amp/gent for maternal fever Anticipated MOD:  NSVD  Alcee Sipos, Jearld Lesch 05/27/2013, 9:47 AM

## 2013-05-28 MED ORDER — INFLUENZA VAC SPLIT QUAD 0.5 ML IM SUSP
0.5000 mL | INTRAMUSCULAR | Status: AC
  Administered 2013-05-29: 0.5 mL via INTRAMUSCULAR
  Filled 2013-05-28: qty 0.5

## 2013-05-28 MED ORDER — TRIAMCINOLONE ACETONIDE 0.1 % EX CREA
1.0000 "application " | TOPICAL_CREAM | Freq: Two times a day (BID) | CUTANEOUS | Status: DC
  Administered 2013-05-28 – 2013-05-29 (×2): 1 via TOPICAL
  Filled 2013-05-28: qty 15

## 2013-05-28 NOTE — Anesthesia Postprocedure Evaluation (Signed)
Anesthesia Post Note  Patient: [facility]  Procedure(s) Performed: * No procedures listed *  Anesthesia type: Epidural  Patient location: Mother/Baby  Post pain: Pain level controlled  Post assessment: Post-op Vital signs reviewed  Last Vitals:  Filed Vitals:   05/28/13 0417  BP: 138/83  Pulse: 74  Temp: 36.7 C  Resp: 16    Post vital signs: Reviewed  Level of consciousness: awake  Complications: No apparent anesthesia complications

## 2013-05-28 NOTE — Progress Notes (Signed)
Post Partum Day 1 Subjective: Pt reports pain in vaginal/perineal area that is worse with moving but tolerable. She took 1 dose of percocet last night but it has been controlled on motrin since then. She reports getting out of bed ad lib, voiding, tolerating PO and + flatus. She is breastfeeding well. She reports significant decrease in bleeding this morning and swelling in labia/perineum.  Objective: Blood pressure 138/83, pulse 74, temperature 98 F (36.7 C), temperature source Oral, resp. rate 16, height 5\' 7"  (1.702 m), weight 95.437 kg (210 lb 6.4 oz), last menstrual period 08/19/2012, SpO2 100.00%, unknown if currently breastfeeding.  Physical Exam:  General: alert, cooperative and no distress Lochia: appropriate Uterine Fundus: firm Laceration repair: no significant drainage, no significant erythema, no induration or tenderness to palpation out of proportion on examination of labia/perineum DVT Evaluation: No evidence of DVT seen on physical exam. Negative Homan's sign. No significant calf/ankle edema.   Recent Labs  05/26/13 2236 05/27/13 1309  HGB 11.0* 10.7*  HCT 31.9* 31.0*    Assessment/Plan: Plan for discharge tomorrow, Breastfeeding and Contraception : undecided, considering OCPs B+, Rubella immune, TDaP given  LOS: 2 days   Pior, Jearld Lesch 05/28/2013, 8:06 AM   I have seen the patient with the resident/student and agree with the above.  Tawnya Crook

## 2013-05-28 NOTE — Clinical Social Work Maternal (Signed)
    Clinical Social Work Department PSYCHOSOCIAL ASSESSMENT - MATERNAL/CHILD 05/28/2013  Patient:  [name] [name], [name] [name]  Account Number:  [id number]  Admit Date:  05/26/2013  Marjo Bicker Name:   [name] [name]    Clinical Social Worker:  Nobie Putnam, LCSW   Date/Time:  05/28/2013 02:19 PM  Date Referred:  05/28/2013   Referral source  CN     Referred reason  [facility]   Other referral source:    I:  FAMILY / HOME ENVIRONMENT Child's legal guardian:  PARENT  Guardian - Name Guardian - Age Guardian - Address  [place] [address] [place], [place] [address]  Jobe Gibbon 26    Other household support members/support persons Name Relationship DOB  Scientist, research (physical sciences) MOTHER    STEPFATHER    Other support:    II  PSYCHOSOCIAL DATA Information Source:  Patient Interview  Insurance claims handler Resources Employment:   Environmental manager resources:  Medicaid If Medicaid - County:  [place] Other  WIC   School / Grade:   Maternity Care Coordinator / Child Services Coordination / Early Interventions:  Cultural issues impacting care:    III  STRENGTHS Strengths  Adequate Resources  Home prepared for Child (including basic supplies)  Supportive family/friends   Strength comment:    IV  RISK FACTORS AND CURRENT PROBLEMS Current Problem:  YES   Risk Factor & Current Problem Patient Issue Family Issue Risk Factor / Current Problem Comment  Other - See comment Y N LPNC @ 35 weeks    V  SOCIAL WORK ASSESSMENT CSW met with pt to assess reason for [facility] @ 35 weeks.  Pt did not learn about the pregnancy until she was 5 months. Due to her work schedule, she was not able to take time off for appointments.  Later during the pregnancy she learned about the [facility] clinic & scheduled an appointment. According to pt, she had to wait 3 weeks before she was seen.  She denies any illegal substance use & verbalized understanding of hospital drug testing policy.  UDS is negative,  meconium results are pending.  She has all the necessary supplies for the infant.  Pt's affect appears to be flat however she denies depression history.  No SI history, per pt.  CSW will continue to monitor drug screen results & make a referral if needed.      VI SOCIAL WORK PLAN Social Work Plan  No Further Intervention Required / No Barriers to Discharge   Type of pt/family education:   If child protective services report - county:   If child protective services report - date:   Information/referral to community resources comment:   Other social work plan:

## 2013-05-28 NOTE — Lactation Note (Signed)
This note was copied from the chart of [name] [place] [name]. Lactation Consultation Note  Patient Name: [name] [name] Today's Date: 05/28/2013 Reason for consult: Follow-up assessment Mom reports baby is nursing well, she denies any nipple tenderness. BF basics reviewed. Cluster feeding discussed. Encouraged Mom to call if she would like LC assist with BF.   Maternal Data    Feeding Feeding Type: Breast Fed Length of feed: 30 min  LATCH Score/Interventions                      Lactation Tools Discussed/Used     Consult Status Consult Status: Follow-up Date: 05/29/13 Follow-up type: In-patient    Alfred Levins 05/28/2013, 5:04 PM

## 2013-05-29 MED ORDER — OXYCODONE-ACETAMINOPHEN 5-325 MG PO TABS: 1.0000 | ORAL_TABLET | Freq: Four times a day (QID) | ORAL | Status: DC | PRN

## 2013-05-29 MED ORDER — IBUPROFEN 600 MG PO TABS: 600.0000 mg | ORAL_TABLET | Freq: Four times a day (QID) | ORAL | Status: DC

## 2013-05-29 NOTE — Lactation Note (Signed)
This note was copied from the chart of [name] [place] [name]. Lactation Consultation Note Follow up visit with mom before DC. She reports that baby has been nursing well- last nursed about 1 hour ago. Mom asking about pump rental. Discussed rental vs purchase. Encouraged to call insurance company about pump. Has manual pump for now. # 27 flange given to mom. Reviewed engorgement prevention and treatment. No questions at present. Reviewed BFSG and OP appointment as resources for support after DC.To call prn.  Patient Name: [name] [name] Today's Date: 05/29/2013 Reason for consult: Follow-up assessment   Maternal Data    Feeding Feeding Type: Breast Fed Length of feed: 25 min  LATCH Score/Interventions                      Lactation Tools Discussed/Used     Consult Status Consult Status: Complete    Pamelia Hoit 05/29/2013, 11:06 AM

## 2013-05-29 NOTE — Discharge Summary (Signed)
Obstetric Discharge Summary Reason for Admission: induction of labor for chronic hypertension Prenatal Procedures: Preeclampsia labs Intrapartum Procedures: spontaneous vaginal delivery and empiric antibiotics for chorioamnionitis Postpartum Procedures: none Complications-Operative and Postpartum: vaginal laceration s/p repair Hemoglobin  Date Value Range Status  05/27/2013 10.7* 12.0 - 15.0 g/dL Final     HCT  Date Value Range Status  05/27/2013 31.0* 36.0 - 46.0 % Final    Physical Exam:  General: alert, cooperative and no distress Lochia: appropriate Uterine Fundus: firm Incision: n/a DVT Evaluation: No evidence of DVT seen on physical exam. Negative Homan's sign. No significant calf/ankle edema.  Discharge Diagnoses: Term Pregnancy-delivered and chronic hypertension  Discharge Information: Date: 05/29/2013 Activity: pelvic rest Diet: routine Medications: PNV and Ibuprofen Condition: stable Instructions: refer to practice specific booklet Discharge to: home  Breast/bottle-feeding Undecided on contraception, considering OCPs  Newborn Data: Live born female  Birth Weight: 8 lb 8 oz (3855 g) APGAR: 6, 9  Home with mother.  Pior, Jearld Lesch 05/29/2013, 7:16 AM  I have seen and examined this patient and I agree with the above. Cam Hai 8:46 AM 05/31/2013

## 2013-07-07 ENCOUNTER — Encounter: Payer: Self-pay | Admitting: Obstetrics & Gynecology

## 2013-07-07 ENCOUNTER — Ambulatory Visit (INDEPENDENT_AMBULATORY_CARE_PROVIDER_SITE_OTHER): Payer: Self-pay | Admitting: Obstetrics & Gynecology

## 2013-07-07 DIAGNOSIS — L259 Unspecified contact dermatitis, unspecified cause: Secondary | ICD-10-CM

## 2013-07-07 DIAGNOSIS — L239 Allergic contact dermatitis, unspecified cause: Secondary | ICD-10-CM

## 2013-07-07 DIAGNOSIS — I10 Essential (primary) hypertension: Secondary | ICD-10-CM

## 2013-07-07 MED ORDER — TRIAMCINOLONE 0.1 % CREAM:EUCERIN CREAM 1:1: 1.0000 "application " | TOPICAL_CREAM | Freq: Three times a day (TID) | CUTANEOUS | Status: DC

## 2013-07-07 MED ORDER — HYDROCHLOROTHIAZIDE 25 MG PO TABS: 25.0000 mg | ORAL_TABLET | Freq: Every day | ORAL | Status: DC

## 2013-07-07 NOTE — Progress Notes (Signed)
Patient ID: [name] [name], female   DOB: 1986/03/02, 28 y.o.   MRN: [id number] Subjective:     [name] [name] is a 28 y.o. female who presents for a postpartum visit. She is 6 weeks postpartum following a spontaneous vaginal delivery. I have fully reviewed the prenatal and intrapartum course. The delivery was at term. Outcome: spontaneous vaginal delivery. Postpartum course has been uncomlicated. Baby's course has been unremarkable. Baby is feeding by breast adn bottle. Bleeding thin lochia. Bowel function is normal. Bladder function is normal. Patient is not sexually active. Contraception method is abstinence. Postpartum depression screening: negative.  The following portions of the patient's history were reviewed and updated as appropriate: allergies, current medications, past family history, past medical history, past social history, past surgical history and problem list.  Review of Systems A comprehensive review of systems was negative.   Objective:    BP 158/111  Pulse 74  Temp(Src) 97.7 F (36.5 C) (Oral)  Ht 5\' 6"  (1.676 m)  Wt 195 lb 6.4 oz (88.633 kg)  BMI 31.55 kg/m2  Breastfeeding? No  General:  alert and no distress           Abdomen: soft, non-tender; bowel sounds normal; no masses,  no organomegaly   Vulva:  normal  Vagina: healing- silver nitrate applied           Rectal Exam: Not performed.        Assessment:     6 weeks postpartum exam. Pap smear not done at today's visit.  Perineal laceration with delayed healing- sliver nitrate applied Chronic HTN on no meds.  BP still elevated Eczema  Plan:    1. Contraception: abstinence- pt wants to wait for insurance change to start birth control  2. Kenalog cream tid prn 3. Follow up in: 3 months or as needed. for BP check 4. HCTZ 25 mg q day 5. Kenalog cream tid prn

## 2013-07-07 NOTE — Patient Instructions (Signed)
Contraception Choices Contraception (birth control) is the use of any methods or devices to prevent pregnancy. Below are some methods to help avoid pregnancy. HORMONAL METHODS   Contraceptive implant This is a thin, plastic tube containing progesterone hormone. It does not contain estrogen hormone. Your health care provider inserts the tube in the inner part of the upper arm. The tube can remain in place for up to 3 years. After 3 years, the implant must be removed. The implant prevents the ovaries from releasing an egg (ovulation), thickens the cervical mucus to prevent sperm from entering the uterus, and thins the lining of the inside of the uterus.  Progesterone-only injections These injections are given every 3 months by your health care provider to prevent pregnancy. This synthetic progesterone hormone stops the ovaries from releasing eggs. It also thickens cervical mucus and changes the uterine lining. This makes it harder for sperm to survive in the uterus.  Birth control pills These pills contain estrogen and progesterone hormone. They work by preventing the ovaries from releasing eggs (ovulation). They also cause the cervical mucus to thicken, preventing the sperm from entering the uterus. Birth control pills are prescribed by a health care provider.Birth control pills can also be used to treat heavy periods.  Minipill This type of birth control pill contains only the progesterone hormone. They are taken every day of each month and must be prescribed by your health care provider.  Birth control patch The patch contains hormones similar to those in birth control pills. It must be changed once a week and is prescribed by a health care provider.  Vaginal ring The ring contains hormones similar to those in birth control pills. It is left in the vagina for 3 weeks, removed for 1 week, and then a new one is put back in place. The patient must be comfortable inserting and removing the ring from the  vagina.A health care provider's prescription is necessary.  Emergency contraception Emergency contraceptives prevent pregnancy after unprotected sexual intercourse. This pill can be taken right after sex or up to 5 days after unprotected sex. It is most effective the sooner you take the pills after having sexual intercourse. Most emergency contraceptive pills are available without a prescription. Check with your pharmacist. Do not use emergency contraception as your only form of birth control. BARRIER METHODS   Female condom This is a thin sheath (latex or rubber) that is worn over the penis during sexual intercourse. It can be used with spermicide to increase effectiveness.  Female condom. This is a soft, loose-fitting sheath that is put into the vagina before sexual intercourse.  Diaphragm This is a soft, latex, dome-shaped barrier that must be fitted by a health care provider. It is inserted into the vagina, along with a spermicidal jelly. It is inserted before intercourse. The diaphragm should be left in the vagina for 6 to 8 hours after intercourse.  Cervical cap This is a round, soft, latex or plastic cup that fits over the cervix and must be fitted by a health care provider. The cap can be left in place for up to 48 hours after intercourse.  Sponge This is a soft, circular piece of polyurethane foam. The sponge has spermicide in it. It is inserted into the vagina after wetting it and before sexual intercourse.  Spermicides These are chemicals that kill or block sperm from entering the cervix and uterus. They come in the form of creams, jellies, suppositories, foam, or tablets. They do not require a   prescription. They are inserted into the vagina with an applicator before having sexual intercourse. The process must be repeated every time you have sexual intercourse. INTRAUTERINE CONTRACEPTION  Intrauterine device (IUD) This is a T-shaped device that is put in a woman's uterus during a  menstrual period to prevent pregnancy. There are 2 types:  Copper IUD This type of IUD is wrapped in copper wire and is placed inside the uterus. Copper makes the uterus and fallopian tubes produce a fluid that kills sperm. It can stay in place for 10 years.  Hormone IUD This type of IUD contains the hormone progestin (synthetic progesterone). The hormone thickens the cervical mucus and prevents sperm from entering the uterus, and it also thins the uterine lining to prevent implantation of a fertilized egg. The hormone can weaken or kill the sperm that get into the uterus. It can stay in place for 3 5 years, depending on which type of IUD is used. PERMANENT METHODS OF CONTRACEPTION  Female tubal ligation This is when the woman's fallopian tubes are surgically sealed, tied, or blocked to prevent the egg from traveling to the uterus.  Hysteroscopic sterilization This involves placing a small coil or insert into each fallopian tube. Your doctor uses a technique called hysteroscopy to do the procedure. The device causes scar tissue to form. This results in permanent blockage of the fallopian tubes, so the sperm cannot fertilize the egg. It takes about 3 months after the procedure for the tubes to become blocked. You must use another form of birth control for these 3 months.  Female sterilization This is when the female has the tubes that carry sperm tied off (vasectomy).This blocks sperm from entering the vagina during sexual intercourse. After the procedure, the man can still ejaculate fluid (semen). NATURAL PLANNING METHODS  Natural family planning This is not having sexual intercourse or using a barrier method (condom, diaphragm, cervical cap) on days the woman could become pregnant.  Calendar method This is keeping track of the length of each menstrual cycle and identifying when you are fertile.  Ovulation method This is avoiding sexual intercourse during ovulation.  Symptothermal method This is  avoiding sexual intercourse during ovulation, using a thermometer and ovulation symptoms.  Post ovulation method This is timing sexual intercourse after you have ovulated. Regardless of which type or method of contraception you choose, it is important that you use condoms to protect against the transmission of sexually transmitted infections (STIs). Talk with your health care provider about which form of contraception is most appropriate for you. Document Released: 06/05/2005 Document Revised: 02/05/2013 Document Reviewed: 11/28/2012 [employer] Patient Information 2014 [employer].  

## 2013-07-09 ENCOUNTER — Encounter: Payer: Self-pay | Admitting: *Deleted

## 2014-01-29 ENCOUNTER — Encounter (HOSPITAL_COMMUNITY): Payer: Self-pay | Admitting: Emergency Medicine

## 2014-01-29 ENCOUNTER — Emergency Department (HOSPITAL_COMMUNITY)
Admission: EM | Admit: 2014-01-29 | Discharge: 2014-01-29 | Disposition: A | Discharge status: Home / Self Care | Payer: Medicaid Other | Attending: Emergency Medicine | Admitting: Emergency Medicine

## 2014-01-29 DIAGNOSIS — Z862 Personal history of diseases of the blood and blood-forming organs and certain disorders involving the immune mechanism: Secondary | ICD-10-CM | POA: Insufficient documentation

## 2014-01-29 DIAGNOSIS — Z3202 Encounter for pregnancy test, result negative: Secondary | ICD-10-CM | POA: Insufficient documentation

## 2014-01-29 DIAGNOSIS — Z8639 Personal history of other endocrine, nutritional and metabolic disease: Secondary | ICD-10-CM | POA: Insufficient documentation

## 2014-01-29 DIAGNOSIS — J45909 Unspecified asthma, uncomplicated: Secondary | ICD-10-CM | POA: Insufficient documentation

## 2014-01-29 DIAGNOSIS — M545 Low back pain, unspecified: Secondary | ICD-10-CM | POA: Insufficient documentation

## 2014-01-29 DIAGNOSIS — IMO0002 Reserved for concepts with insufficient information to code with codable children: Secondary | ICD-10-CM | POA: Insufficient documentation

## 2014-01-29 DIAGNOSIS — M549 Dorsalgia, unspecified: Secondary | ICD-10-CM

## 2014-01-29 DIAGNOSIS — I10 Essential (primary) hypertension: Secondary | ICD-10-CM | POA: Insufficient documentation

## 2014-01-29 DIAGNOSIS — F17200 Nicotine dependence, unspecified, uncomplicated: Secondary | ICD-10-CM | POA: Insufficient documentation

## 2014-01-29 DIAGNOSIS — Z79899 Other long term (current) drug therapy: Secondary | ICD-10-CM | POA: Insufficient documentation

## 2014-01-29 DIAGNOSIS — M546 Pain in thoracic spine: Secondary | ICD-10-CM | POA: Insufficient documentation

## 2014-01-29 DIAGNOSIS — Z872 Personal history of diseases of the skin and subcutaneous tissue: Secondary | ICD-10-CM | POA: Insufficient documentation

## 2014-01-29 LAB — POC URINE PREG, ED: Preg Test, Ur: NEGATIVE

## 2014-01-29 MED ORDER — NAPROXEN 500 MG PO TABS: 500.0000 mg | ORAL_TABLET | Freq: Two times a day (BID) | ORAL | Status: DC

## 2014-01-29 MED ORDER — TRAMADOL HCL 50 MG PO TABS: 50.0000 mg | ORAL_TABLET | Freq: Four times a day (QID) | ORAL | Status: DC | PRN

## 2014-01-29 NOTE — ED Provider Notes (Signed)
Medical screening examination/treatment/procedure(s) were performed by non-physician practitioner and as supervising physician I was immediately available for consultation/collaboration.   EKG Interpretation None        Hoy Morn, MD 01/29/14 1521

## 2014-01-29 NOTE — ED Notes (Signed)
Pt c/o rt sided back pain x 1 wk.  Believes it may be due to straining trying to pick her son up.  Worse when she reaches out.

## 2014-01-29 NOTE — Discharge Instructions (Signed)
Call for a follow up appointment with a Family or Primary Care Provider.  Return if Symptoms worsen.   Take medication as prescribed.  Ice your back 3-4 times a day. Do some gentle stretching of your lower back and upper back today.   Emergency Department Resource Guide 1) Find a Doctor and Pay Out of Pocket Although you won't have to find out who is covered by your insurance plan, it is a good idea to ask around and get recommendations. You will then need to call the office and see if the doctor you have chosen will accept you as a new patient and what types of options they offer for patients who are self-pay. Some doctors offer discounts or will set up payment plans for their patients who do not have insurance, but you will need to ask so you aren't surprised when you get to your appointment.  2) Contact Your Local Health Department Not all health departments have doctors that can see patients for sick visits, but many do, so it is worth a call to see if yours does. If you don't know where your local health department is, you can check in your phone book. The CDC also has a tool to help you locate your state's health department, and many state websites also have listings of all of their local health departments.  3) Find a [facility] If your illness is not likely to be very severe or complicated, you may want to try a walk in clinic. These are popping up all over the country in pharmacies, drugstores, and shopping centers. They're usually staffed by nurse practitioners or physician assistants that have been trained to treat common illnesses and complaints. They're usually fairly quick and inexpensive. However, if you have serious medical issues or chronic medical problems, these are probably not your best option.  No Primary Care Doctor: - Call Health Connect at  [phone] - they can help you locate a primary care doctor that  accepts your insurance, provides certain services,  etc. - Physician Referral Service- [phone]  Chronic Pain Problems: Organization         Address  Phone   Notes  [facility]  [phone] Patients need to be referred by their primary care doctor.   Medication Assistance: Organization         Address  Phone   Notes  [facility] Medication [facility] [address] [place], [place] [address] [phone] --Must be a resident of [facility] -- Must have NO insurance coverage whatsoever (no Medicaid/ Medicare, etc.) -- The pt. MUST have a primary care doctor that directs their care regularly and follows them in the community   [facility]ist  [phone]   [employer]  [phone]    Agencies that provide inexpensive medical care: Organization         Address  Phone   Notes  [place]  [phone]   [facility]    [phone]   [facility], [place] [address] [phone]   [place] [address], [place] [phone]   Planned Parenthood    [phone]   [facility]    [phone]   [place] and [place] [address], [place] Phone:  [phone], Fax:  [phone] Hours of Operation:  9 am - 6 pm, M-F.  Also accepts Medicaid/Medicare and self-pay.  [facility] for [place] [address] [place], [address], [place] Phone: [phone], Fax: [phone]. Hours of Operation:  8:30 am - 5:30 pm, M-F.  Also accepts Medicaid and self-pay.  [facility] [address] [place] Phone: [phone]   [place], [place], [place] [phone], Ext. [phone] Mondays & Thursdays: 7-9 AM.  First 15 patients are seen on a first come, first serve basis.    [place] Providers:  Organization         Address  Phone   Notes  [facility] [address],  [phone] Also accepts self-pay patients.  [facility] [address], [place]  [phone]   [place], [address], [place] [phone]   [facility] [address] [place] [phone]   Lucianne Lei [address], [place]   [phone] Only accepts [place] Access [place] patients after they have their name applied to their card.   Self-Pay (no insurance) in [facility]:  Organization         Address  Phone   Notes  Sickle Cell Patients, [facility] [place] [phone]   [facility] [place] [phone]   [facility] [place]  [place], [facility], [place] [phone]   [facility]/Dr. Osei-Bonsu  [address], [place] or [address], [place] [phone] Phone number for both [place] and [place] locations is the same.  Urgent Medical and [facility] [address] [place] [phone]   [facility] [address] [place] or [address] [phone]   [facility] [address], [place] [phone], phone; [phone], fax Sees patients 1st and 3rd Saturday of every month.  Must not qualify for public or private insurance (i.e. Medicaid, Medicare, [place] Health Choice, Veterans' Benefits)  Household income should be no more than 200% of the poverty level The clinic cannot treat you if you are pregnant or think you are pregnant  Sexually transmitted diseases are not treated at the clinic.    Dental Care: Organization         Address  Phone  Notes  [facility] Department of [facility] [phone] Accepts children up to  age 51 who are enrolled in [place] or [place]; pregnant women with a Medicaid card; and children who have applied for Medicaid or [place] Health Choice, but were declined, whose parents can pay a reduced fee at time of service.  [facility] Department of [facility]  [address], [place] [phone] Accepts children up to age 24 who are enrolled in [place] or [place]; pregnant women with a Medicaid card; and children who have applied for Medicaid or [place] Health Choice, but were declined, whose parents can pay a reduced fee at time of service.  [place] Adult [facility] Access PROGRAM  [place] [phone] Patients are seen by appointment only. Walk-ins are not accepted. [place] will see patients 54 years of age and older. Monday - Tuesday (  8am-5pm) Most Wednesdays (8:30-5pm) $30 per visit, cash only  [facility] Adult Dental Access PROGRAM  [address], [facility] [phone] Patients are seen by appointment only. Walk-ins are not accepted. [place] will see patients 2 years of age and older. One Wednesday Evening (Monthly: Volunteer Based).  $30 per visit, cash only  [place]  [phone] for adults; Children under age 70, call Graduate Pediatric Dentistry at [phone]. Children aged 26-14, please call [phone] to request a pediatric application.  Dental services are provided in all areas of dental care including fillings, crowns and bridges, complete and partial dentures, implants, gum treatment, root canals, and extractions. Preventive care is also provided. Treatment is provided to both adults and children. Patients are selected via a lottery and there is often a waiting list.   [facility] [address], [place]  [phone] [email or web address]   [facility] [address] [place], [place] [phone], Ext. [phone] Second and Fourth Thursday of  each month, opens at 6:30 AM; Clinic ends at 9 AM.  Patients are seen on a first-come first-served basis, and a limited number are seen during each clinic.   [facility]  [address] Hillard Danker [place], [place] [phone]   Eligibility Requirements You must have lived in [place], [place], or [place] counties for at least the last three months.   You cannot be eligible for state or federal sponsored [employer], including [employer], [place], or [employer].   You generally cannot be eligible for healthcare insurance through your employer.    How to apply: Eligibility screenings are held every Tuesday and Wednesday afternoon from 1:00 pm until 4:00 pm. You do not need an appointment for the interview!  [facility] [address], [place], [place]   [place]  Grants Department  [place]  [phone]    Behavioral Health Resources in the Community: Intensive Outpatient Programs Organization         Address  Phone  Notes  [place]. [address] [place], [place] [phone]   [name] Outpatient [address], [place], [place]   ADS: Alcohol & Drug Svcs [address] [place], [place]   [place] [address],  [place], [place] or [phone]   Substance Abuse Resources Organization         Address  Phone  Notes  Alcohol and Drug Services  [phone]   [place]  [phone]   [facility] [place]   Chinita Pester  [phone]   Residential & Outpatient Substance Abuse Program  [phone]   Psychological Services Organization         Address  Phone  Notes  [facility] [place]  [place]  [phone]   [place] [address]  [place] or [phone]    Mobile Crisis Teams Organization         Address  Phone  Notes  [facility], Mobile Crisis Care Unit  [phone]   [facility]  [address] [place], [place]   [place] [address], [place] [phone]    Self-Help/Support Groups Organization         Address  Phone  Notes  Mental Health Assoc. of [place] - variety of support groups  [place] Call for more information  Narcotics Anonymous (NA), [facility] [address], [employer] [place]  2 meetings at this location   Special educational needs teacher         Address  Phone  Notes  ASAP Residential Treatment [place],    [place]  [phone]   [facility]  [address], [place] [address], [place], [place]   [place], [place] [phone] Admissions: 8am-3pm M-F  Incentives Substance [place] [address]    [place], [place] [phone]   [facility] [address] [place], [place], [place]   [facility] [address],  [place], [place]   [facility]ight Programs - Intensive Outpatient [address], [place], [place]   [facility] ([employer] [place].,  [place], [place] [phone] or [phone]   [facility] Services (RTS) [address], [place], [place] Accepts Medicaid  [facility] [place] [address],  [place] [phone] Substance Abuse/Addiction Treatment   [facility] Organization         Address  Phone  Notes  [facility]  [phone]   Domenic Schwab, PhD [address] Arlis Porta [place], [place]   [phone] or [phone]   [place], [place] [phone]     [facility] [address], [place], [place] [phone] Insurance/Medicaid/sponsorship through [facility] and Families [address], [address] [place]                                    [place], [place] [phone] [place] [address], [place] [phone]    Dr. Adele Schilder  [phone]   [facility] [place] Dept. 1) [address] [place] 2) [place] 3)  [place] [address], [place] [phone]  [phone]   [place] [phone] or [phone] (After Hours)

## 2014-01-29 NOTE — ED Provider Notes (Signed)
CSN: [id number]     Arrival date & time 01/29/14  1059 History   First MD Initiated Contact with Patient 01/29/14 1105     Chief Complaint  Patient presents with  . Back Pain     (Consider location/radiation/quality/duration/timing/severity/associated sxs/prior Treatment) HPI Comments: The patient is a 28 year old female presents emergency room chief complaint of bilateral low back pain for several days.  She reports increase in discomfort of lower extremities with sitting down for long periods of time. She reports right sided upper back pain worsened with upper treatment movement, specifically reaching out and picking up her 41-month-old son. Reports taking Tylenol with resolution of symptoms. No lower extremity weakness or numbness. Last menstrual period 12/26/2013, no birth control. The patient is not breast-feeding at this time. No PCP.  Patient is a 28 y.o. female presenting with back pain. The history is provided by the patient. No language interpreter was used.  Back Pain Associated symptoms: no fever, no numbness and no weakness     Past Medical History  Diagnosis Date  . Eczema   . Hypertension   . Seasonal allergies   . Lactose intolerance   . Asthma, allergic     uses inhaler prn - infrequently   Past Surgical History  Procedure Laterality Date  . Wisdom tooth extraction     Family History  Problem Relation Age of Onset  . Adopted: Yes  . Hypertension Father   . Hypertension Maternal Aunt   . Hypertension Maternal Uncle   . Hypertension Maternal Grandmother   . Hypertension Maternal Grandfather   . Hypertension Paternal Grandmother    History  Substance Use Topics  . Smoking status: Current Every Day Smoker -- 0.25 packs/day    Types: Cigarettes  . Smokeless tobacco: Former Systems developer  . Alcohol Use: No   OB History   Grav Para Term Preterm Abortions TAB SAB Ect Mult Living   1 1 1       1      Review of Systems  Constitutional: Negative for fever and chills.    Genitourinary: Negative for urgency.  Musculoskeletal: Positive for back pain. Negative for gait problem.  Neurological: Negative for weakness and numbness.      Allergies  Shellfish allergy and Sulfa antibiotics  Home Medications   Prior to Admission medications   Medication Sig Start Date End Date Taking? Authorizing Provider  diphenhydrAMINE (BENADRYL) 25 MG tablet Take 25 mg by mouth every 6 (six) hours as needed for itching or allergies.    Yes Historical Provider, MD  loratadine (CLARITIN) 10 MG tablet Take 1 tablet (10 mg total) by mouth daily. 03/28/13  Yes Seabron Spates, CNM  polyvinyl alcohol (LIQUIFILM TEARS) 1.4 % ophthalmic solution Place 1 drop into both eyes daily as needed for dry eyes.   Yes Historical Provider, MD  triamcinolone cream (KENALOG) 0.1 % Apply 1 application topically 2 (two) times daily. To affected areas for eczema.   Yes Historical Provider, MD   BP 148/78  Pulse 75  Temp(Src) 98.9 F (37.2 C) (Oral)  Resp 18  SpO2 100% Physical Exam  Nursing note and vitals reviewed. Constitutional: She is oriented to person, place, and time. She appears well-developed and well-nourished. No distress.  HENT:  Head: Normocephalic and atraumatic.  Neck: Neck supple.  Pulmonary/Chest: Effort normal. No respiratory distress.  Musculoskeletal:       Back:  Neurological: She is alert and oriented to person, place, and time.  Normal gait without assistance.  Skin:  Skin is warm and dry. She is not diaphoretic.  Psychiatric: She has a normal mood and affect. Her behavior is normal.    ED Course  Procedures (including critical care time) Labs Review Labs Reviewed  POC URINE PREG, ED    Imaging Review No results found.   EKG Interpretation None      MDM   Final diagnoses:  Bilateral low back pain without sciatica  Upper back pain on right side   Patient with back pain. Normal Gait.  No loss of bowel or bladder control.  No concern for cauda  equina.  No fever, night sweats, weight loss, h/o cancer, IVDU.  Negative Urine preg. RICE protocol and pain medicine indicated and discussed with patient.  Meds given in ED:  Medications - No data to display  Discharge Medication List as of 01/29/2014 11:46 AM    START taking these medications   Details  naproxen (NAPROSYN) 500 MG tablet Take 1 tablet (500 mg total) by mouth 2 (two) times daily with a meal., Starting 01/29/2014, Until Discontinued, Print    traMADol (ULTRAM) 50 MG tablet Take 1 tablet (50 mg total) by mouth every 6 (six) hours as needed., Starting 01/29/2014, Until Discontinued, Print             Harvie Heck, PA-C 01/29/14 1324

## 2014-04-02 ENCOUNTER — Emergency Department (HOSPITAL_COMMUNITY)
Admission: EM | Admit: 2014-04-02 | Discharge: 2014-04-02 | Disposition: A | Discharge status: Home / Self Care | Payer: Medicaid Other | Source: Home / Self Care | Attending: Family Medicine | Admitting: Family Medicine

## 2014-04-02 ENCOUNTER — Encounter (HOSPITAL_COMMUNITY): Payer: Self-pay | Admitting: Emergency Medicine

## 2014-04-02 ENCOUNTER — Emergency Department (HOSPITAL_COMMUNITY)
Admission: EM | Admit: 2014-04-02 | Discharge: 2014-04-02 | Discharge status: Against Medical Advice | Payer: Medicaid Other | Attending: Emergency Medicine | Admitting: Emergency Medicine

## 2014-04-02 DIAGNOSIS — R1112 Projectile vomiting: Secondary | ICD-10-CM | POA: Diagnosis not present

## 2014-04-02 DIAGNOSIS — J45909 Unspecified asthma, uncomplicated: Secondary | ICD-10-CM | POA: Diagnosis not present

## 2014-04-02 DIAGNOSIS — S0501XA Injury of conjunctiva and corneal abrasion without foreign body, right eye, initial encounter: Secondary | ICD-10-CM

## 2014-04-02 DIAGNOSIS — I10 Essential (primary) hypertension: Secondary | ICD-10-CM | POA: Diagnosis not present

## 2014-04-02 DIAGNOSIS — O219 Vomiting of pregnancy, unspecified: Secondary | ICD-10-CM

## 2014-04-02 DIAGNOSIS — Z720 Tobacco use: Secondary | ICD-10-CM | POA: Diagnosis not present

## 2014-04-02 DIAGNOSIS — R1110 Vomiting, unspecified: Secondary | ICD-10-CM | POA: Diagnosis present

## 2014-04-02 DIAGNOSIS — H578 Other specified disorders of eye and adnexa: Secondary | ICD-10-CM | POA: Insufficient documentation

## 2014-04-02 DIAGNOSIS — R112 Nausea with vomiting, unspecified: Secondary | ICD-10-CM

## 2014-04-02 DIAGNOSIS — Z3201 Encounter for pregnancy test, result positive: Secondary | ICD-10-CM

## 2014-04-02 LAB — POCT PREGNANCY, URINE: Preg Test, Ur: POSITIVE — AB

## 2014-04-02 LAB — POCT URINALYSIS DIP (DEVICE)
BILIRUBIN URINE: NEGATIVE
GLUCOSE, UA: NEGATIVE mg/dL
Ketones, ur: NEGATIVE mg/dL
Nitrite: NEGATIVE
Protein, ur: NEGATIVE mg/dL
SPECIFIC GRAVITY, URINE: 1.015 (ref 1.005–1.030)
Urobilinogen, UA: 0.2 mg/dL (ref 0.0–1.0)
pH: 7 (ref 5.0–8.0)

## 2014-04-02 MED ORDER — ERYTHROMYCIN 5 MG/GM OP OINT: TOPICAL_OINTMENT | OPHTHALMIC | Status: DC

## 2014-04-02 MED ORDER — DOXYLAMINE-PYRIDOXINE 10-10 MG PO TBEC: 1.0000 | DELAYED_RELEASE_TABLET | Freq: Four times a day (QID) | ORAL | Status: DC | PRN

## 2014-04-02 MED ORDER — ONDANSETRON 4 MG PO TBDP
4.0000 mg | ORAL_TABLET | Freq: Once | ORAL | Status: AC
  Administered 2014-04-02: 4 mg via ORAL

## 2014-04-02 MED ORDER — ONDANSETRON 4 MG PO TBDP
ORAL_TABLET | ORAL | Status: AC
  Filled 2014-04-02: qty 1

## 2014-04-02 NOTE — ED Notes (Signed)
Patient complains of vomiting that started yesterday and continued until early morning hours today.  Denies diarrhea.  Currently continues to feel nauseated.  Also woke during the night to find right eye red, watery, swollen.

## 2014-04-02 NOTE — Discharge Instructions (Signed)
See your doctor for further prenatal care.

## 2014-04-02 NOTE — ED Provider Notes (Signed)
CSN: [id number]     Arrival date & time 04/02/14  1815 History   First MD Initiated Contact with Patient 04/02/14 1844     Chief Complaint  Patient presents with  . Emesis  . Eye Pain   (Consider location/radiation/quality/duration/timing/severity/associated sxs/prior Treatment) Patient is a 28 y.o. female presenting with vomiting and eye pain. The history is provided by the patient.  Emesis Severity:  Mild Duration:  1 day Quality:  Stomach contents Progression:  Resolved Chronicity:  New Associated symptoms: no abdominal pain, no cough, no diarrhea and no fever   Risk factors: suspect food intake   Risk factors: not pregnant now   Eye Pain Pertinent negatives include no abdominal pain.    Past Medical History  Diagnosis Date  . Eczema   . Hypertension   . Seasonal allergies   . Lactose intolerance   . Asthma, allergic     uses inhaler prn - infrequently   Past Surgical History  Procedure Laterality Date  . Wisdom tooth extraction     Family History  Problem Relation Age of Onset  . Adopted: Yes  . Hypertension Father   . Hypertension Maternal Aunt   . Hypertension Maternal Uncle   . Hypertension Maternal Grandmother   . Hypertension Maternal Grandfather   . Hypertension Paternal Grandmother    History  Substance Use Topics  . Smoking status: Current Every Day Smoker -- 0.25 packs/day    Types: Cigarettes  . Smokeless tobacco: Former Systems developer  . Alcohol Use: No   OB History   Grav Para Term Preterm Abortions TAB SAB Ect Mult Living   1 1 1       1      Review of Systems  Constitutional: Negative.   Eyes: Positive for pain and redness.  Gastrointestinal: Positive for nausea and vomiting. Negative for abdominal pain, diarrhea and constipation.    Allergies  Shellfish allergy and Sulfa antibiotics  Home Medications   Prior to Admission medications   Medication Sig Start Date End Date Taking? Authorizing Provider  diphenhydrAMINE (BENADRYL) 25 MG tablet  Take 25 mg by mouth every 6 (six) hours as needed for itching or allergies.     Historical Provider, MD  Doxylamine-Pyridoxine 10-10 MG TBEC Take 1 tablet by mouth 4 (four) times daily as needed. 04/02/14   Billy Fischer, MD  erythromycin ophthalmic ointment Use od qid 04/02/14   Billy Fischer, MD  loratadine (CLARITIN) 10 MG tablet Take 1 tablet (10 mg total) by mouth daily. 03/28/13   Seabron Spates, CNM  naproxen (NAPROSYN) 500 MG tablet Take 1 tablet (500 mg total) by mouth 2 (two) times daily with a meal. 01/29/14   Harvie Heck, PA-C  polyvinyl alcohol (LIQUIFILM TEARS) 1.4 % ophthalmic solution Place 1 drop into both eyes daily as needed for dry eyes.    Historical Provider, MD  traMADol (ULTRAM) 50 MG tablet Take 1 tablet (50 mg total) by mouth every 6 (six) hours as needed. 01/29/14   Harvie Heck, PA-C  triamcinolone cream (KENALOG) 0.1 % Apply 1 application topically 2 (two) times daily. To affected areas for eczema.    Historical Provider, MD   BP 125/76  Pulse 75  Temp(Src) 98.3 F (36.8 C) (Oral)  Resp 16  SpO2 99%  LMP 03/03/2014 Physical Exam  Nursing note and vitals reviewed. Constitutional: She is oriented to person, place, and time. She appears well-developed and well-nourished. No distress.  HENT:  Mouth/Throat: Oropharynx is clear and moist.  Eyes: EOM are normal. Pupils are equal, round, and reactive to light. Right eye exhibits no discharge. Left eye exhibits no discharge. Right conjunctiva is injected. Right conjunctiva has no hemorrhage.  Neck: Normal range of motion. Neck supple.  Cardiovascular: Normal heart sounds.   Pulmonary/Chest: Breath sounds normal.  Abdominal: Soft. Bowel sounds are normal.  Neurological: She is alert and oriented to person, place, and time.  Skin: Skin is warm and dry.    ED Course  Procedures (including critical care time) Labs Review Labs Reviewed  POCT URINALYSIS DIP (DEVICE) - Abnormal; Notable for the following:    Hgb  urine dipstick TRACE (*)    Leukocytes, UA TRACE (*)    All other components within normal limits  POCT PREGNANCY, URINE - Abnormal; Notable for the following:    Preg Test, Ur POSITIVE (*)    All other components within normal limits   u preg pos. Imaging Review No results found.   MDM   1. Vomiting complicating pregnancy   2. Corneal abrasion, right, initial encounter       Billy Fischer, MD 04/02/14 1918

## 2014-04-02 NOTE — ED Notes (Signed)
Patient states she has been having vomiting for a week, but started "projectile vomiting" yesterday.   Patient states that she thinks "i'm knocked up.   I took a pregnancy test and it was negative.   I am also having trouble with my right eye.  I think I got vomiting in it, unless my baby poked me".

## 2014-04-16 ENCOUNTER — Inpatient Hospital Stay (HOSPITAL_COMMUNITY): Payer: Medicaid Other

## 2014-04-16 ENCOUNTER — Inpatient Hospital Stay (HOSPITAL_COMMUNITY)
Admission: AD | Admit: 2014-04-16 | Discharge: 2014-04-16 | Disposition: A | Discharge status: Home / Self Care | Payer: Medicaid Other | Source: Ambulatory Visit | Attending: Obstetrics & Gynecology | Admitting: Obstetrics & Gynecology

## 2014-04-16 ENCOUNTER — Encounter (HOSPITAL_COMMUNITY): Payer: Self-pay | Admitting: *Deleted

## 2014-04-16 DIAGNOSIS — O9989 Other specified diseases and conditions complicating pregnancy, childbirth and the puerperium: Secondary | ICD-10-CM

## 2014-04-16 DIAGNOSIS — O26891 Other specified pregnancy related conditions, first trimester: Secondary | ICD-10-CM | POA: Diagnosis present

## 2014-04-16 DIAGNOSIS — B9689 Other specified bacterial agents as the cause of diseases classified elsewhere: Secondary | ICD-10-CM

## 2014-04-16 DIAGNOSIS — Z3A01 Less than 8 weeks gestation of pregnancy: Secondary | ICD-10-CM | POA: Diagnosis not present

## 2014-04-16 DIAGNOSIS — O10911 Unspecified pre-existing hypertension complicating pregnancy, first trimester: Secondary | ICD-10-CM | POA: Diagnosis not present

## 2014-04-16 DIAGNOSIS — F17210 Nicotine dependence, cigarettes, uncomplicated: Secondary | ICD-10-CM | POA: Diagnosis not present

## 2014-04-16 DIAGNOSIS — I10 Essential (primary) hypertension: Secondary | ICD-10-CM

## 2014-04-16 DIAGNOSIS — O26899 Other specified pregnancy related conditions, unspecified trimester: Secondary | ICD-10-CM

## 2014-04-16 DIAGNOSIS — N760 Acute vaginitis: Secondary | ICD-10-CM | POA: Diagnosis not present

## 2014-04-16 DIAGNOSIS — O99330 Smoking (tobacco) complicating pregnancy, unspecified trimester: Secondary | ICD-10-CM | POA: Insufficient documentation

## 2014-04-16 DIAGNOSIS — R109 Unspecified abdominal pain: Secondary | ICD-10-CM

## 2014-04-16 LAB — CBC
HCT: 35.9 % — ABNORMAL LOW (ref 36.0–46.0)
HEMOGLOBIN: 12.5 g/dL (ref 12.0–15.0)
MCH: 32.1 pg (ref 26.0–34.0)
MCHC: 34.8 g/dL (ref 30.0–36.0)
MCV: 92.1 fL (ref 78.0–100.0)
Platelets: 222 10*3/uL (ref 150–400)
RBC: 3.9 MIL/uL (ref 3.87–5.11)
RDW: 12.8 % (ref 11.5–15.5)
WBC: 5.1 10*3/uL (ref 4.0–10.5)

## 2014-04-16 LAB — ABO/RH: ABO/RH(D): B POS

## 2014-04-16 LAB — URINALYSIS, ROUTINE W REFLEX MICROSCOPIC
BILIRUBIN URINE: NEGATIVE
Glucose, UA: NEGATIVE mg/dL
Hgb urine dipstick: NEGATIVE
Ketones, ur: NEGATIVE mg/dL
NITRITE: NEGATIVE
PH: 6.5 (ref 5.0–8.0)
PROTEIN: NEGATIVE mg/dL
Specific Gravity, Urine: 1.015 (ref 1.005–1.030)
Urobilinogen, UA: 0.2 mg/dL (ref 0.0–1.0)

## 2014-04-16 LAB — URINE MICROSCOPIC-ADD ON

## 2014-04-16 LAB — WET PREP, GENITAL
Trich, Wet Prep: NONE SEEN
YEAST WET PREP: NONE SEEN

## 2014-04-16 LAB — HCG, QUANTITATIVE, PREGNANCY: HCG, BETA CHAIN, QUANT, S: 65238 m[IU]/mL — AB (ref <5)

## 2014-04-16 LAB — HIV ANTIBODY (ROUTINE TESTING W REFLEX): HIV 1&2 Ab, 4th Generation: NONREACTIVE

## 2014-04-16 MED ORDER — METRONIDAZOLE 500 MG PO TABS: 500.0000 mg | ORAL_TABLET | Freq: Two times a day (BID) | ORAL | Status: DC

## 2014-04-16 NOTE — MAU Note (Signed)
Pt reports she is having abd and back cramping since 10pm last night. Denies vag bleeding or discharge.

## 2014-04-16 NOTE — MAU Provider Note (Signed)
Attestation of Attending Supervision of Advanced Practitioner (CNM/NP): Evaluation and management procedures were performed by the Advanced Practitioner under my supervision and collaboration.  I have reviewed the Advanced Practitioner's note and chart, and I agree with the management and plan.  HARRAWAY-SMITH, Acelin Ferdig 7:35 PM

## 2014-04-16 NOTE — MAU Provider Note (Signed)
History     CSN: [id number]  Arrival date and time: 04/16/14 1432   First Provider Initiated Contact with Patient 04/16/14 1535      Chief Complaint  Patient presents with  . Abdominal Pain   HPI [facility] 28 y.o. G2P1001 @[redacted]w[redacted]d  presents to MAU complaining of abdominal pain that wraps around to her bottom.  The pain started at 9pm last night and is intermittent.  It sometimes feels like cramps and sometimes like food poisoning in lower abdomen.  The pain is 8-9/10.  It is accompanied by nausea and vomiting 2 times yesterday and none today.  Eating has gone okay and no irritative factors are noted.  No meds used for this.  She has weakness with the severe pains.  She denies vaginal bleeding, fever, dysuria, chest pain, headache, shortness of breath.   OB History   Grav Para Term Preterm Abortions TAB SAB Ect Mult Living   2 1 1       1       Past Medical History  Diagnosis Date  . Eczema   . Hypertension   . Seasonal allergies   . Lactose intolerance   . Asthma, allergic     uses inhaler prn - infrequently    Past Surgical History  Procedure Laterality Date  . Wisdom tooth extraction      Family History  Problem Relation Age of Onset  . Adopted: Yes  . Hypertension Father   . Hypertension Maternal Aunt   . Hypertension Maternal Uncle   . Hypertension Maternal Grandmother   . Hypertension Maternal Grandfather   . Hypertension Paternal Grandmother     History  Substance Use Topics  . Smoking status: Current Every Day Smoker -- 0.25 packs/day    Types: Cigarettes  . Smokeless tobacco: Former Systems developer  . Alcohol Use: No    Allergies:  Allergies  Allergen Reactions  . Shellfish Allergy Other (See Comments)    Stomach upset  . Sulfa Antibiotics Swelling    Prescriptions prior to admission  Medication Sig Dispense Refill  . diphenhydrAMINE (BENADRYL) 25 MG tablet Take 25 mg by mouth every 6 (six) hours as needed for itching or allergies.       .  Doxylamine-Pyridoxine 10-10 MG TBEC Take 1 tablet by mouth 4 (four) times daily as needed.  60 tablet  1  . erythromycin ophthalmic ointment Use od qid  3.5 g  0  . erythromycin ophthalmic ointment Use od qid  3.5 g  0  . loratadine (CLARITIN) 10 MG tablet Take 1 tablet (10 mg total) by mouth daily.  30 tablet  1  . naproxen (NAPROSYN) 500 MG tablet Take 1 tablet (500 mg total) by mouth 2 (two) times daily with a meal.  30 tablet  0  . polyvinyl alcohol (LIQUIFILM TEARS) 1.4 % ophthalmic solution Place 1 drop into both eyes daily as needed for dry eyes.      . traMADol (ULTRAM) 50 MG tablet Take 1 tablet (50 mg total) by mouth every 6 (six) hours as needed.  10 tablet  0  . triamcinolone cream (KENALOG) 0.1 % Apply 1 application topically 2 (two) times daily. To affected areas for eczema.        ROS Pertinent ROS in HPI Physical Exam   Blood pressure 141/89, pulse 85, temperature 98.7 F (37.1 C), resp. rate 18, height 5\' 7"  (1.702 m), weight 205 lb 3.2 oz (93.078 kg), last menstrual period 03/03/2014, not currently breastfeeding.  Physical Exam  Constitutional: She appears well-developed and well-nourished.  HENT:  Head: Normocephalic.  Eyes: EOM are normal.  Neck: Normal range of motion.  Cardiovascular: Normal rate and regular rhythm.   Respiratory: Effort normal and breath sounds normal.  GI: Soft. Bowel sounds are normal. She exhibits no distension. There is no tenderness. There is no rebound and no guarding.  Genitourinary:  Moderate amt of thick, white homogenous discharge with some froth noted.   No blood seen.   Cervix closed.  No CMT/no adnexal mass or tenderness.   Difficult assessment of fundal height.  Musculoskeletal: Normal range of motion.  Neurological: She is alert.  Skin: Skin is warm and dry.  Psychiatric: She has a normal mood and affect.   Results for orders placed during the hospital encounter of 04/16/14 (from the past 24 hour(s))  URINALYSIS, ROUTINE W  REFLEX MICROSCOPIC     Status: Abnormal   Collection Time    04/16/14  2:50 PM      Result Value Ref Range   Color, Urine YELLOW  YELLOW   APPearance CLEAR  CLEAR   Specific Gravity, Urine 1.015  1.005 - 1.030   pH 6.5  5.0 - 8.0   Glucose, UA NEGATIVE  NEGATIVE mg/dL   Hgb urine dipstick NEGATIVE  NEGATIVE   Bilirubin Urine NEGATIVE  NEGATIVE   Ketones, ur NEGATIVE  NEGATIVE mg/dL   Protein, ur NEGATIVE  NEGATIVE mg/dL   Urobilinogen, UA 0.2  0.0 - 1.0 mg/dL   Nitrite NEGATIVE  NEGATIVE   Leukocytes, UA SMALL (*) NEGATIVE  URINE MICROSCOPIC-ADD ON     Status: Abnormal   Collection Time    04/16/14  2:50 PM      Result Value Ref Range   Squamous Epithelial / LPF FEW (*) RARE   WBC, UA 0-2  <3 WBC/hpf   RBC / HPF 0-2  <3 RBC/hpf   Bacteria, UA RARE  RARE  WET PREP, GENITAL     Status: Abnormal   Collection Time    04/16/14  3:50 PM      Result Value Ref Range   Yeast Wet Prep HPF POC NONE SEEN  NONE SEEN   Trich, Wet Prep NONE SEEN  NONE SEEN   Clue Cells Wet Prep HPF POC MANY (*) NONE SEEN   WBC, Wet Prep HPF POC FEW (*) NONE SEEN  CBC     Status: Abnormal   Collection Time    04/16/14  3:55 PM      Result Value Ref Range   WBC 5.1  4.0 - 10.5 K/uL   RBC 3.90  3.87 - 5.11 MIL/uL   Hemoglobin 12.5  12.0 - 15.0 g/dL   HCT 35.9 (*) 36.0 - 46.0 %   MCV 92.1  78.0 - 100.0 fL   MCH 32.1  26.0 - 34.0 pg   MCHC 34.8  30.0 - 36.0 g/dL   RDW 12.8  11.5 - 15.5 %   Platelets 222  150 - 400 K/uL  ABO/RH     Status: None   Collection Time    04/16/14  3:55 PM      Result Value Ref Range   ABO/RH(D) B POS    HCG, QUANTITATIVE, PREGNANCY     Status: Abnormal   Collection Time    04/16/14  3:55 PM      Result Value Ref Range   hCG, Beta Chain, [place], [place] [address] (*) <5 mIU/mL   US Ob Comp Less 14 Wks  04/16/2014  CLINICAL DATA:  Pelvic pain  EXAM: OBSTETRIC <14 WK [name] AND TRANSVAGINAL OB US  TECHNIQUE: Both transabdominal and transvaginal ultrasound examinations were performed  for complete evaluation of the gestation as well as the maternal uterus, adnexal regions, and pelvic cul-de-sac. Transvaginal technique was performed to assess early pregnancy.  COMPARISON:  CT abdomen and pelvis July 05, 2012  FINDINGS: Intrauterine gestational sac: Visualized/normal in shape.  Yolk sac:  Visualized  Embryo:  Visualized  Cardiac Activity: Visualized  Heart Rate:  168 bpm  CRL:   31  mm   10 w 0 d                  [name] EDC: Nov 11, 2013  Maternal uterus/adnexae: There is no demonstrable subchorionic hemorrhage. Cervical os is closed.  There is an echogenic mass in the right ovary measuring 2.6 x 2.2 x 2.8 cm consistent with an ovarian dermoid. There is no other extrauterine pelvic mass. No maternal free fluid.  IMPRESSION: Single live intrauterine gestation with estimated gestational age of [redacted] weeks. Right ovarian dermoid present.   Electronically Signed   By: Lowella Grip M.D.   On: 04/16/2014 17:22   US Ob Transvaginal  04/16/2014   CLINICAL DATA:  Pelvic pain  EXAM: OBSTETRIC <14 WK [name] AND TRANSVAGINAL OB US  TECHNIQUE: Both transabdominal and transvaginal ultrasound examinations were performed for complete evaluation of the gestation as well as the maternal uterus, adnexal regions, and pelvic cul-de-sac. Transvaginal technique was performed to assess early pregnancy.  COMPARISON:  CT abdomen and pelvis July 05, 2012  FINDINGS: Intrauterine gestational sac: Visualized/normal in shape.  Yolk sac:  Visualized  Embryo:  Visualized  Cardiac Activity: Visualized  Heart Rate:  168 bpm  CRL:   31  mm   10 w 0 d                  [name] EDC: Nov 11, 2013  Maternal uterus/adnexae: There is no demonstrable subchorionic hemorrhage. Cervical os is closed.  There is an echogenic mass in the right ovary measuring 2.6 x 2.2 x 2.8 cm consistent with an ovarian dermoid. There is no other extrauterine pelvic mass. No maternal free fluid.  IMPRESSION: Single live intrauterine gestation with estimated  gestational age of [redacted] weeks. Right ovarian dermoid present.   Electronically Signed   By: Lowella Grip M.D.   On: 04/16/2014 17:22    MAU Course  Procedures  MDM Wet Prep with many clues + clinical correlation suggest Bacterial Vaginosis.   Pt BP status discussed with Dr. Ihor Dow that agrees pt can be discharged without initiation of BP meds at this time.  Get in for [facility] asap.  Assessment and Plan  A:  1. Abdominal pain in pregnancy   2. Bacterial vaginosis   3. Chronic hypertension    P:  Discharge to home Flagyl x 1 week No etoh/IC x 10 days Encourage po fluids Pregnancy verification letter provided Obtain [facility] asap Return to MAU for emergency  [name] [name] 04/16/2014, 3:36 PM

## 2014-04-16 NOTE — Discharge Instructions (Signed)
Abdominal Pain During Pregnancy Abdominal pain is common in pregnancy. Most of the time, it does not cause harm. There are many causes of abdominal pain. Some causes are more serious than others. Some of the causes of abdominal pain in pregnancy are easily diagnosed. Occasionally, the diagnosis takes time to understand. Other times, the cause is not determined. Abdominal pain can be a sign that something is very wrong with the pregnancy, or the pain may have nothing to do with the pregnancy at all. For this reason, always tell your health care provider if you have any abdominal discomfort. HOME CARE INSTRUCTIONS  Monitor your abdominal pain for any changes. The following actions may help to alleviate any discomfort you are experiencing:  Do not have sexual intercourse or put anything in your vagina until your symptoms go away completely.  Get plenty of rest until your pain improves.  Drink clear fluids if you feel nauseous. Avoid solid food as long as you are uncomfortable or nauseous.  Only take over-the-counter or prescription medicine as directed by your health care provider.  Keep all follow-up appointments with your health care provider. SEEK IMMEDIATE MEDICAL CARE IF:  You are bleeding, leaking fluid, or passing tissue from the vagina.  You have increasing pain or cramping.  You have persistent vomiting.  You have painful or bloody urination.  You have a fever.  You notice a decrease in your baby's movements.  You have extreme weakness or feel faint.  You have shortness of breath, with or without abdominal pain.  You develop a severe headache with abdominal pain.  You have abnormal vaginal discharge with abdominal pain.  You have persistent diarrhea.  You have abdominal pain that continues even after rest, or gets worse. MAKE SURE YOU:   Understand these instructions.  Will watch your condition.  Will get help right away if you are not doing well or get  worse. Document Released: 06/05/2005 Document Revised: 03/26/2013 Document Reviewed: 01/02/2013 [facility] Patient Information 2015 [place], [place]. This information is not intended to replace advice given to you by your health care provider. Make sure you discuss any questions you have with your health care provider. Bacterial Vaginosis Bacterial vaginosis is a vaginal infection that occurs when the normal balance of bacteria in the vagina is disrupted. It results from an overgrowth of certain bacteria. This is the most common vaginal infection in women of childbearing age. Treatment is important to prevent complications, especially in pregnant women, as it can cause a premature delivery. CAUSES  Bacterial vaginosis is caused by an increase in harmful bacteria that are normally present in smaller amounts in the vagina. Several different kinds of bacteria can cause bacterial vaginosis. However, the reason that the condition develops is not fully understood. RISK FACTORS Certain activities or behaviors can put you at an increased risk of developing bacterial vaginosis, including:  Having a new sex partner or multiple sex partners.  Douching.  Using an intrauterine device (IUD) for contraception. Women do not get bacterial vaginosis from toilet seats, bedding, swimming pools, or contact with objects around them. SIGNS AND SYMPTOMS  Some women with bacterial vaginosis have no signs or symptoms. Common symptoms include:  Grey vaginal discharge.  A fishlike odor with discharge, especially after sexual intercourse.  Itching or burning of the vagina and vulva.  Burning or pain with urination. DIAGNOSIS  Your health care provider will take a medical history and examine the vagina for signs of bacterial vaginosis. A sample of vaginal fluid may  be taken. Your health care provider will look at this sample under a microscope to check for bacteria and abnormal cells. A vaginal pH test may also be done.   TREATMENT  Bacterial vaginosis may be treated with antibiotic medicines. These may be given in the form of a pill or a vaginal cream. A second round of antibiotics may be prescribed if the condition comes back after treatment.  HOME CARE INSTRUCTIONS   Only take over-the-counter or prescription medicines as directed by your health care provider.  If antibiotic medicine was prescribed, take it as directed. Make sure you finish it even if you start to feel better.  Do not have sex until treatment is completed.  Tell all sexual partners that you have a vaginal infection. They should see their health care provider and be treated if they have problems, such as a mild rash or itching.  Practice safe sex by using condoms and only having one sex partner. SEEK MEDICAL CARE IF:   Your symptoms are not improving after 3 days of treatment.  You have increased discharge or pain.  You have a fever. MAKE SURE YOU:   Understand these instructions.  Will watch your condition.  Will get help right away if you are not doing well or get worse. FOR MORE INFORMATION  Centers for Disease Control and Prevention, Division of STD Prevention: [employer] American Sexual Health Association (ASHA): www.ashastd.org  Document Released: 06/05/2005 Document Revised: 03/26/2013 Document Reviewed: 01/15/2013 [facility] Patient Information 2015 [place], [place]. This information is not intended to replace advice given to you by your health care provider. Make sure you discuss any questions you have with your health care provider.

## 2014-04-17 LAB — GC/CHLAMYDIA PROBE AMP
CT Probe RNA: NEGATIVE
GC PROBE AMP APTIMA: NEGATIVE

## 2014-04-20 ENCOUNTER — Encounter (HOSPITAL_COMMUNITY): Payer: Self-pay | Admitting: *Deleted

## 2014-06-09 ENCOUNTER — Inpatient Hospital Stay (HOSPITAL_COMMUNITY)
Admission: AD | Admit: 2014-06-09 | Discharge: 2014-06-09 | Disposition: A | Discharge status: Home / Self Care | Payer: Medicaid Other | Source: Ambulatory Visit | Attending: Family Medicine | Admitting: Family Medicine

## 2014-06-09 ENCOUNTER — Encounter (HOSPITAL_COMMUNITY): Payer: Self-pay | Admitting: *Deleted

## 2014-06-09 DIAGNOSIS — N760 Acute vaginitis: Secondary | ICD-10-CM | POA: Diagnosis not present

## 2014-06-09 DIAGNOSIS — O23592 Infection of other part of genital tract in pregnancy, second trimester: Secondary | ICD-10-CM | POA: Diagnosis not present

## 2014-06-09 DIAGNOSIS — B9689 Other specified bacterial agents as the cause of diseases classified elsewhere: Secondary | ICD-10-CM | POA: Diagnosis not present

## 2014-06-09 DIAGNOSIS — O209 Hemorrhage in early pregnancy, unspecified: Secondary | ICD-10-CM | POA: Insufficient documentation

## 2014-06-09 DIAGNOSIS — Z3A17 17 weeks gestation of pregnancy: Secondary | ICD-10-CM | POA: Diagnosis not present

## 2014-06-09 DIAGNOSIS — B3731 Acute candidiasis of vulva and vagina: Secondary | ICD-10-CM

## 2014-06-09 DIAGNOSIS — O99332 Smoking (tobacco) complicating pregnancy, second trimester: Secondary | ICD-10-CM | POA: Diagnosis not present

## 2014-06-09 DIAGNOSIS — O4692 Antepartum hemorrhage, unspecified, second trimester: Secondary | ICD-10-CM

## 2014-06-09 DIAGNOSIS — B373 Candidiasis of vulva and vagina: Secondary | ICD-10-CM

## 2014-06-09 DIAGNOSIS — F17210 Nicotine dependence, cigarettes, uncomplicated: Secondary | ICD-10-CM | POA: Insufficient documentation

## 2014-06-09 LAB — URINE MICROSCOPIC-ADD ON

## 2014-06-09 LAB — URINALYSIS, ROUTINE W REFLEX MICROSCOPIC
Bilirubin Urine: NEGATIVE
Glucose, UA: NEGATIVE mg/dL
HGB URINE DIPSTICK: NEGATIVE
KETONES UR: NEGATIVE mg/dL
Nitrite: NEGATIVE
PH: 6.5 (ref 5.0–8.0)
Protein, ur: NEGATIVE mg/dL
SPECIFIC GRAVITY, URINE: 1.025 (ref 1.005–1.030)
Urobilinogen, UA: 0.2 mg/dL (ref 0.0–1.0)

## 2014-06-09 LAB — WET PREP, GENITAL: Trich, Wet Prep: NONE SEEN

## 2014-06-09 MED ORDER — FLUCONAZOLE 150 MG PO TABS: 150.0000 mg | ORAL_TABLET | Freq: Every day | ORAL | Status: DC

## 2014-06-09 MED ORDER — METRONIDAZOLE 500 MG PO TABS: 500.0000 mg | ORAL_TABLET | Freq: Two times a day (BID) | ORAL | Status: DC

## 2014-06-09 NOTE — MAU Provider Note (Signed)
History     CSN: [id number]  Arrival date and time: 06/09/14 1611   First Provider Initiated Contact with Patient 06/09/14 1708      Chief Complaint  Patient presents with  . Vaginal Bleeding  . Abdominal Cramping   HPI 28 y.o. G2P1001 [redacted]w[redacted]d presents with bright red bleeding on toilet paper after urination approx 1:30 am, denies soaking toilet paper or pad. Denies spotting throughout the day. Reports a second episode of blood from vagina upon wiping approximately 16:00. Denies vaginal discharge, n/v, fever/chills, diarrhea, constipation or melena. Reports intermittent dull bil lower pelvic cramping pain. Denies use of home meds.  Pt reports appt in 1 week at [facility].  OB History    Gravida Para Term Preterm AB TAB SAB Ectopic Multiple Living   2 1 1       1       Past Medical History  Diagnosis Date  . Eczema   . Hypertension   . Seasonal allergies   . Lactose intolerance   . Asthma, allergic     uses inhaler prn - infrequently    Past Surgical History  Procedure Laterality Date  . Wisdom tooth extraction      Family History  Problem Relation Age of Onset  . Adopted: Yes  . Hypertension Father   . Hypertension Maternal Aunt   . Hypertension Maternal Uncle   . Hypertension Maternal Grandmother   . Hypertension Maternal Grandfather   . Hypertension Paternal Grandmother     History  Substance Use Topics  . Smoking status: Current Every Day Smoker -- 0.25 packs/day    Types: Cigarettes  . Smokeless tobacco: Former Systems developer  . Alcohol Use: No    Allergies:  Allergies  Allergen Reactions  . Sulfa Antibiotics Swelling    Causes swelling on the face.  . Shellfish Allergy Other (See Comments)    Stomach upset    Prescriptions prior to admission  Medication Sig Dispense Refill Last Dose  . acetaminophen (TYLENOL) 325 MG tablet Take 325 mg by mouth every 6 (six) hours as needed for moderate pain.   06/09/2014 at Unknown time  . cetirizine (ZYRTEC) 10  MG tablet Take 10 mg by mouth daily.   06/08/2014 at Unknown time  . Prenatal Vit-Fe Fumarate-FA (PRENATAL MULTIVITAMIN) TABS tablet Take 1 tablet by mouth daily at 12 noon.   06/08/2014 at Unknown time  . triamcinolone cream (KENALOG) 0.1 % Apply 1 application topically 2 (two) times daily. To affected areas for eczema.   06/09/2014 at Unknown time  . Doxylamine-Pyridoxine 10-10 MG TBEC Take 1 tablet by mouth 4 (four) times daily as needed. (Patient not taking: Reported on 06/09/2014) 60 tablet 1 Not Taking at Unknown time  . metroNIDAZOLE (FLAGYL) 500 MG tablet Take 1 tablet (500 mg total) by mouth 2 (two) times daily. (Patient not taking: Reported on 06/09/2014) 14 tablet 0 Completed Course at Unknown time    Review of Systems  Constitutional: Negative for fever, chills and weight loss.  Gastrointestinal: Negative for heartburn, nausea, abdominal pain, diarrhea, constipation, blood in stool and melena.  Genitourinary: Negative for dysuria, urgency, frequency, hematuria and flank pain.  Musculoskeletal: Negative for back pain.  Neurological: Negative for dizziness.   Physical Exam   Blood pressure 112/97, pulse 79, temperature 98.7 F (37.1 C), temperature source Oral, resp. rate 20, height 5\' 8"  (1.727 m), weight 211 lb 9.6 oz (95.981 kg), last menstrual period 03/03/2014, SpO2 100 %, not currently breastfeeding.  Physical Exam  Constitutional: She appears well-developed and well-nourished.  Cardiovascular: Normal rate, normal heart sounds and intact distal pulses.   No murmur heard. Respiratory: Effort normal and breath sounds normal. No respiratory distress.  GI: Soft. Bowel sounds are normal. She exhibits no distension and no mass. There is no tenderness. There is no rebound and no guarding.  Genitourinary: Vagina normal.  Rt labia major 1.5cm well defined nodule nontender to palpate, no discharge, skin intact. Thick white chunky vaginal discharge. Cervix macerated and inflammed with  purulent white discharge.Cervical os closed   Psychiatric: She has a normal mood and affect. Her behavior is normal. Thought content normal.  Fetal heart tones documented on triage.  MAU Course  Procedures  MDM Wet prep and G/C to evaluate discharge. Fetal heart tones confirmed via doppler.  Results for orders placed or performed during the hospital encounter of 06/09/14 (from the past 24 hour(s))  Wet prep, genital     Status: Abnormal   Collection Time: 06/09/14  5:20 PM  Result Value Ref Range   Yeast Wet Prep HPF POC FEW (A) NONE SEEN   Trich, Wet Prep NONE SEEN NONE SEEN   Clue Cells Wet Prep HPF POC FEW (A) NONE SEEN   WBC, Wet Prep HPF POC MANY (A) NONE SEEN    Assessment and Plan  A: Bacterial vaginosis, Yeast, Bleeding in pregnancy - likely from cervix.   P: Discharge to home Pelvic Rest Diflucan 150mg  PO once today and on day 7 Flagyl 500mg  PO BID x 7d to treat BV OTC Tylenol PRN pain, OTC probiotics Pt educated on dietary recommendations and fluid intake in pregnancy F/U in [place] on 06/16/14 Return if experiences a change or worsening in condition. Patient may return to MAU as needed or if her condition were to change or worsen   [employer], FNP-S  Paticia Stack 06/09/2014, 5:08 PM

## 2014-06-09 NOTE — MAU Note (Signed)
Patient states when she went to the bathroom this am she had blood on the tissue, stopped then started again just before coming in. No recent intercourse. Has been having an uncomfortable feeling, slight cramping in the lower abdomen and low back.

## 2014-06-09 NOTE — Discharge Instructions (Signed)
Bacterial Vaginosis Bacterial vaginosis is a vaginal infection that occurs when the normal balance of bacteria in the vagina is disrupted. It results from an overgrowth of certain bacteria. This is the most common vaginal infection in women of childbearing age. Treatment is important to prevent complications, especially in pregnant women, as it can cause a premature delivery. CAUSES  Bacterial vaginosis is caused by an increase in harmful bacteria that are normally present in smaller amounts in the vagina. Several different kinds of bacteria can cause bacterial vaginosis. However, the reason that the condition develops is not fully understood. RISK FACTORS Certain activities or behaviors can put you at an increased risk of developing bacterial vaginosis, including:  Having a new sex partner or multiple sex partners.  Douching.  Using an intrauterine device (IUD) for contraception. Women do not get bacterial vaginosis from toilet seats, bedding, swimming pools, or contact with objects around them. SIGNS AND SYMPTOMS  Some women with bacterial vaginosis have no signs or symptoms. Common symptoms include:  Grey vaginal discharge.  A fishlike odor with discharge, especially after sexual intercourse.  Itching or burning of the vagina and vulva.  Burning or pain with urination. DIAGNOSIS  Your health care provider will take a medical history and examine the vagina for signs of bacterial vaginosis. A sample of vaginal fluid may be taken. Your health care provider will look at this sample under a microscope to check for bacteria and abnormal cells. A vaginal pH test may also be done.  TREATMENT  Bacterial vaginosis may be treated with antibiotic medicines. These may be given in the form of a pill or a vaginal cream. A second round of antibiotics may be prescribed if the condition comes back after treatment.  HOME CARE INSTRUCTIONS   Only take over-the-counter or prescription medicines as  directed by your health care provider.  If antibiotic medicine was prescribed, take it as directed. Make sure you finish it even if you start to feel better.  Do not have sex until treatment is completed.  Tell all sexual partners that you have a vaginal infection. They should see their health care provider and be treated if they have problems, such as a mild rash or itching.  Practice safe sex by using condoms and only having one sex partner. SEEK MEDICAL CARE IF:   Your symptoms are not improving after 3 days of treatment.  You have increased discharge or pain.  You have a fever. MAKE SURE YOU:   Understand these instructions.  Will watch your condition.  Will get help right away if you are not doing well or get worse. FOR MORE INFORMATION  Centers for Disease Control and Prevention, Division of STD Prevention: Appraiser[employer]raud[employer] American Sexual Health Association (ASHA): www.ashastd.org  Document Released: 06/05/2005 Document Revised: 03/26/2013 Document Reviewed: 01/15/2013 [facility] Patient Information 2015 [place], [place]. This information is not intended to replace advice given to you by your health care provider. Make sure you discuss any questions you have with your health care provider. Pelvic Rest Pelvic rest is sometimes recommended for women when:   The placenta is partially or completely covering the opening of the cervix (placenta previa).  There is bleeding between the uterine wall and the amniotic sac in the first trimester (subchorionic hemorrhage).  The cervix begins to open without labor starting (incompetent cervix, cervical insufficiency).  The labor is too early (preterm labor). HOME CARE INSTRUCTIONS  Do not have sexual intercourse, stimulation, or an orgasm.  Do not use tampons, douche, or  put anything in the vagina.  Do not lift anything over 10 pounds (4.5 kg).  Avoid strenuous activity or straining your pelvic muscles. SEEK MEDICAL CARE  IF:  You have any vaginal bleeding during pregnancy. Treat this as a potential emergency.  You have cramping pain felt low in the stomach (stronger than menstrual cramps).  You notice vaginal discharge (watery, mucus, or bloody).  You have a low, dull backache.  There are regular contractions or uterine tightening. SEEK IMMEDIATE MEDICAL CARE IF: You have vaginal bleeding and have placenta previa.  Document Released: 09/30/2010 Document Revised: 08/28/2011 Document Reviewed: 09/30/2010 [facility] Patient Information 2015 [place], [place]. This information is not intended to replace advice given to you by your health care provider. Make sure you discuss any questions you have with your health care provider. Candida Infection A Candida infection (also called yeast, fungus, and Monilia infection) is an overgrowth of yeast that can occur anywhere on the body. A yeast infection commonly occurs in warm, moist body areas. Usually, the infection remains localized but can spread to become a systemic infection. A yeast infection may be a sign of a more severe disease such as diabetes, leukemia, or AIDS. A yeast infection can occur in both men and women. In women, Candida vaginitis is a vaginal infection. It is one of the most common causes of vaginitis. Men usually do not have symptoms or know they have an infection until other problems develop. Men may find out they have a yeast infection because their sex partner has a yeast infection. Uncircumcised men are more likely to get a yeast infection than circumcised men. This is because the uncircumcised glans is not exposed to air and does not remain as dry as that of a circumcised glans. Older adults may develop yeast infections around dentures. CAUSES  Women  Antibiotics.  Steroid medication taken for a long time.  Being overweight (obese).  Diabetes.  Poor immune condition.  Certain serious medical conditions.  Immune suppressive medications for  organ transplant patients.  Chemotherapy.  Pregnancy.  Menstruation.  Stress and fatigue.  Intravenous drug use.  Oral contraceptives.  Wearing tight-fitting clothes in the crotch area.  Catching it from a sex partner who has a yeast infection.  Spermicide.  Intravenous, urinary, or other catheters. Men  Catching it from a sex partner who has a yeast infection.  Having oral or anal sex with a person who has the infection.  Spermicide.  Diabetes.  Antibiotics.  Poor immune system.  Medications that suppress the immune system.  Intravenous drug use.  Intravenous, urinary, or other catheters. SYMPTOMS  Women  Thick, white vaginal discharge.  Vaginal itching.  Redness and swelling in and around the vagina.  Irritation of the lips of the vagina and perineum.  Blisters on the vaginal lips and perineum.  Painful sexual intercourse.  Low blood sugar (hypoglycemia).  Painful urination.  Bladder infections.  Intestinal problems such as constipation, indigestion, bad breath, bloating, increase in gas, diarrhea, or loose stools. Men  Men may develop intestinal problems such as constipation, indigestion, bad breath, bloating, increase in gas, diarrhea, or loose stools.  Dry, cracked skin on the penis with itching or discomfort.  Jock itch.  Dry, flaky skin.  Athlete's foot.  Hypoglycemia. DIAGNOSIS  Women  A history and an exam are performed.  The discharge may be examined under a microscope.  A culture may be taken of the discharge. Men  A history and an exam are performed.  Any discharge from  the penis or areas of cracked skin will be looked at under the microscope and cultured.  Stool samples may be cultured. TREATMENT  Women  Vaginal antifungal suppositories and creams.  Medicated creams to decrease irritation and itching on the outside of the vagina.  Warm compresses to the perineal area to decrease swelling and  discomfort.  Oral antifungal medications.  Medicated vaginal suppositories or cream for repeated or recurrent infections.  Wash and dry the irritation areas before applying the cream.  Eating yogurt with Lactobacillus may help with prevention and treatment.  Sometimes painting the vagina with gentian violet solution may help if creams and suppositories do not work. Men  Antifungal creams and oral antifungal medications.  Sometimes treatment must continue for 30 days after the symptoms go away to prevent recurrence. HOME CARE INSTRUCTIONS  Women  Use cotton underwear and avoid tight-fitting clothing.  Avoid colored, scented toilet paper and deodorant tampons or pads.  Do not douche.  Keep your diabetes under control.  Finish all the prescribed medications.  Keep your skin clean and dry.  Consume milk or yogurt with Lactobacillus-active culture regularly. If you get frequent yeast infections and think that is what the infection is, there are over-the-counter medications that you can get. If the infection does not show healing in 3 days, talk to your caregiver.  Tell your sex partner you have a yeast infection. Your partner may need treatment also, especially if your infection does not clear up or recurs. Men  Keep your skin clean and dry.  Keep your diabetes under control.  Finish all prescribed medications.  Tell your sex partner that you have a yeast infection so he or she can be treated if necessary. SEEK MEDICAL CARE IF:   Your symptoms do not clear up or worsen in one week after treatment.  You have an oral temperature above 102 F (38.9 C).  You have trouble swallowing or eating for a prolonged time.  You develop blisters on and around your vagina.  You develop vaginal bleeding and it is not your menstrual period.  You develop abdominal pain.  You develop intestinal problems as mentioned above.  You get weak or light-headed.  You have painful or  increased urination.  You have pain during sexual intercourse. MAKE SURE YOU:   Understand these instructions.  Will watch your condition.  Will get help right away if you are not doing well or get worse. Document Released: 07/13/2004 Document Revised: 10/20/2013 Document Reviewed: 10/25/2009 [facility] Patient Information 2015 [place], [place]. This information is not intended to replace advice given to you by your health care provider. Make sure you discuss any questions you have with your health care provider.

## 2014-06-10 LAB — GC/CHLAMYDIA PROBE AMP
CT Probe RNA: NEGATIVE
GC PROBE AMP APTIMA: NEGATIVE

## 2014-06-16 ENCOUNTER — Encounter: Payer: Self-pay | Admitting: Family Medicine

## 2014-06-16 ENCOUNTER — Ambulatory Visit (INDEPENDENT_AMBULATORY_CARE_PROVIDER_SITE_OTHER): Payer: Medicaid Other | Admitting: Family Medicine

## 2014-06-16 VITALS — BP 124/75 | HR 86 | Temp 98.7°F | Wt 209.5 lb

## 2014-06-16 DIAGNOSIS — O0992 Supervision of high risk pregnancy, unspecified, second trimester: Secondary | ICD-10-CM

## 2014-06-16 DIAGNOSIS — Z23 Encounter for immunization: Secondary | ICD-10-CM

## 2014-06-16 DIAGNOSIS — O10912 Unspecified pre-existing hypertension complicating pregnancy, second trimester: Secondary | ICD-10-CM

## 2014-06-16 LAB — COMPREHENSIVE METABOLIC PANEL
ALBUMIN: 3.5 g/dL (ref 3.5–5.2)
ALT: 22 U/L (ref 0–35)
AST: 19 U/L (ref 0–37)
Alkaline Phosphatase: 54 U/L (ref 39–117)
BUN: 5 mg/dL — AB (ref 6–23)
CHLORIDE: 106 meq/L (ref 96–112)
CO2: 22 mEq/L (ref 19–32)
Calcium: 8.8 mg/dL (ref 8.4–10.5)
Creat: 0.47 mg/dL — ABNORMAL LOW (ref 0.50–1.10)
Glucose, Bld: 68 mg/dL — ABNORMAL LOW (ref 70–99)
Potassium: 4.3 mEq/L (ref 3.5–5.3)
Sodium: 136 mEq/L (ref 135–145)
Total Bilirubin: 0.2 mg/dL (ref 0.2–1.2)
Total Protein: 6.3 g/dL (ref 6.0–8.3)

## 2014-06-16 LAB — POCT URINALYSIS DIP (DEVICE)
Bilirubin Urine: NEGATIVE
GLUCOSE, UA: NEGATIVE mg/dL
Hgb urine dipstick: NEGATIVE
KETONES UR: NEGATIVE mg/dL
Nitrite: NEGATIVE
Protein, ur: NEGATIVE mg/dL
SPECIFIC GRAVITY, URINE: 1.02 (ref 1.005–1.030)
Urobilinogen, UA: 0.2 mg/dL (ref 0.0–1.0)
pH: 5.5 (ref 5.0–8.0)

## 2014-06-16 MED ORDER — ASPIRIN EC 81 MG PO TBEC: 81.0000 mg | DELAYED_RELEASE_TABLET | Freq: Every day | ORAL | Status: DC

## 2014-06-16 NOTE — Progress Notes (Signed)
C/o of occasional pelvic discomfort.  Initial blood work today.

## 2014-06-16 NOTE — Patient Instructions (Signed)
Second Trimester of Pregnancy The second trimester is from week 13 through week 28, month 4 through 6. This is often the time in pregnancy that you feel your best. Often times, morning sickness has lessened or quit. You may have more energy, and you may get hungry more often. Your unborn baby (fetus) is growing rapidly. At the end of the sixth month, he or she is about 9 inches long and weighs about 1 pounds. You will likely feel the baby move (quickening) between 18 and 20 weeks of pregnancy. HOME CARE   Avoid all smoking, herbs, and alcohol. Avoid drugs not approved by your doctor.  Only take medicine as told by your doctor. Some medicines are safe and some are not during pregnancy.  Exercise only as told by your doctor. Stop exercising if you start having cramps.  Eat regular, healthy meals.  Wear a good support bra if your breasts are tender.  Do not use hot tubs, steam rooms, or saunas.  Wear your seat belt when driving.  Avoid raw meat, uncooked cheese, and liter boxes and soil used by cats.  Take your prenatal vitamins.  Try taking medicine that helps you poop (stool softener) as needed, and if your doctor approves. Eat more fiber by eating fresh fruit, vegetables, and whole grains. Drink enough fluids to keep your pee (urine) clear or pale yellow.  Take warm water baths (sitz baths) to soothe pain or discomfort caused by hemorrhoids. Use hemorrhoid cream if your doctor approves.  If you have puffy, bulging veins (varicose veins), wear support hose. Raise (elevate) your feet for 15 minutes, 3-4 times a day. Limit salt in your diet.  Avoid heavy lifting, wear low heals, and sit up straight.  Rest with your legs raised if you have leg cramps or low back pain.  Visit your dentist if you have not gone during your pregnancy. Use a soft toothbrush to brush your teeth. Be gentle when you floss.  You can have sex (intercourse) unless your doctor tells you not to.  Go to your  doctor visits. GET HELP IF:   You feel dizzy.  You have mild cramps or pressure in your lower belly (abdomen).  You have a nagging pain in your belly area.  You continue to feel sick to your stomach (nauseous), throw up (vomit), or have watery poop (diarrhea).  You have bad smelling fluid coming from your vagina.  You have pain with peeing (urination). GET HELP RIGHT AWAY IF:   You have a fever.  You are leaking fluid from your vagina.  You have spotting or bleeding from your vagina.  You have severe belly cramping or pain.  You lose or gain weight rapidly.  You have trouble catching your breath and have chest pain.  You notice sudden or extreme puffiness (swelling) of your face, hands, ankles, feet, or legs.  You have not felt the baby move in over an hour.  You have severe headaches that do not go away with medicine.  You have vision changes. Document Released: 08/30/2009 Document Revised: 09/30/2012 Document Reviewed: 08/06/2012 [facility] Patient Information 2015 [place], [place]. This information is not intended to replace advice given to you by your health care provider. Make sure you discuss any questions you have with your health care provider.

## 2014-06-16 NOTE — Progress Notes (Signed)
   Subjective:    [name] [name] is a G2P1001 [redacted]w[redacted]d being seen today for her first obstetrical visit.  Her obstetrical history is significant for pregnancy induced hypertension. Patient does intend to breast feed. Pregnancy history fully reviewed.  Patient reports no bleeding, no contractions, no cramping and no leaking.  Filed Vitals:   06/16/14 1450  BP: 124/75  Pulse: 86  Temp: 98.7 F (37.1 C)  Weight: 209 lb 8 oz (95.029 kg)    HISTORY: OB History  Gravida Para Term Preterm AB SAB TAB Ectopic Multiple Living  2 1 1       1     # Outcome Date GA Lbr Len/2nd Weight Sex Delivery Anes PTL Lv  2 Current           1 Term 05/27/13 [redacted]w[redacted]d 05:01 / 06:04 8 lb 8 oz (3.855 kg) F Vag-Spont EPI,Local  Y     Comments: WNL     Past Medical History  Diagnosis Date  . Eczema   . Hypertension   . Seasonal allergies   . Lactose intolerance   . Asthma, allergic     uses inhaler prn - infrequently   Past Surgical History  Procedure Laterality Date  . Wisdom tooth extraction     Family History  Problem Relation Age of Onset  . Adopted: Yes  . Hypertension Father   . Hypertension Maternal Aunt   . Hypertension Maternal Uncle   . Hypertension Maternal Grandmother   . Hypertension Maternal Grandfather   . Hypertension Paternal Grandmother   . Hypertension Mother      Exam    System:     Skin: normal coloration and turgor, no rashes    Neurologic: oriented, normal, gait normal; reflexes normal and symmetric   Extremities: normal strength, tone, and muscle mass   HEENT PERRLA   Mouth/Teeth mucous membranes moist, pharynx normal without lesions   Neck supple and no masses   Cardiovascular: regular rate and rhythm, no murmurs or gallops   Respiratory:  appears well, vitals normal, no respiratory distress, acyanotic, normal RR, ear and throat exam is normal, neck free of mass or lymphadenopathy, chest clear, no wheezing, crepitations, rhonchi, normal symmetric air entry   Abdomen: soft, non-tender; bowel sounds normal; no masses,  no organomegaly      Assessment:    Pregnancy: G2P1001 Patient Active Problem List   Diagnosis Date Noted  . Chronic hypertension in obstetric context in third trimester 05/26/2013  . Cyst of ovary, right 04/29/2013  . Supervision of high-risk pregnancy 04/27/2013  . Chronic hypertension complicating pregnancy, antepartum 04/27/2013  . Pyelonephritis due to Escherichia coli 07/09/2012  . Sepsis due to urinary tract infection 07/09/2012  . Hypokalemia 07/09/2012        Plan:     Initial labs drawn. Prenatal vitamins. Problem list reviewed and updated. Genetic Screening discussed Quad Screen: ordered.  Ultrasound discussed; fetal survey: ordered.  Follow up in 4 weeks. 80% of 30 min visit spent on counseling and coordination of care.  24 hr Urine ordered.  CMP ordered due to [facility].  Start daily aspirin  Loma Boston JEHIEL 06/16/2014

## 2014-06-17 LAB — GC/CHLAMYDIA PROBE AMP, URINE
Chlamydia, Swab/Urine, PCR: NEGATIVE
GC Probe Amp, Urine: NEGATIVE

## 2014-06-17 LAB — PRENATAL PROFILE (SOLSTAS)
Antibody Screen: NEGATIVE
Basophils Absolute: 0 10*3/uL (ref 0.0–0.1)
Basophils Relative: 0 % (ref 0–1)
Eosinophils Absolute: 0.2 10*3/uL (ref 0.0–0.7)
Eosinophils Relative: 3 % (ref 0–5)
HEMATOCRIT: 33.8 % — AB (ref 36.0–46.0)
HIV: NONREACTIVE
Hemoglobin: 11.8 g/dL — ABNORMAL LOW (ref 12.0–15.0)
Hepatitis B Surface Ag: NEGATIVE
Lymphocytes Relative: 36 % (ref 12–46)
Lymphs Abs: 1.8 10*3/uL (ref 0.7–4.0)
MCH: 32.6 pg (ref 26.0–34.0)
MCHC: 34.9 g/dL (ref 30.0–36.0)
MCV: 93.4 fL (ref 78.0–100.0)
MONO ABS: 0.5 10*3/uL (ref 0.1–1.0)
MPV: 11.7 fL (ref 8.6–12.4)
Monocytes Relative: 9 % (ref 3–12)
Neutro Abs: 2.7 10*3/uL (ref 1.7–7.7)
Neutrophils Relative %: 52 % (ref 43–77)
Platelets: 243 10*3/uL (ref 150–400)
RBC: 3.62 MIL/uL — AB (ref 3.87–5.11)
RDW: 13.8 % (ref 11.5–15.5)
RH TYPE: POSITIVE
Rubella: 10.7 Index — ABNORMAL HIGH (ref <0.90)
WBC: 5.1 10*3/uL (ref 4.0–10.5)

## 2014-06-18 LAB — AFP, QUAD SCREEN
AFP: 36.4 ng/mL
Curr Gest Age: 18.5 wks.days
Down Syndrome Scr Risk Est: 1:17300 {titer}
HCG TOTAL: 9.15 [IU]/mL
INH: 176.1 pg/mL
Interpretation-AFP: NEGATIVE
MOM FOR AFP: 0.96
MOM FOR HCG: 0.44
MoM for INH: 1.21
Open Spina bifida: NEGATIVE
Osb Risk: 1:15600 {titer}
Tri 18 Scr Risk Est: NEGATIVE
Trisomy 18 (Edward) Syndrome Interp.: 1:650 {titer}
UE3 MOM: 0.57
UE3 VALUE: 0.79 ng/mL

## 2014-06-18 LAB — HEMOGLOBINOPATHY EVALUATION
HGB A: 96.9 % (ref 96.8–97.8)
HGB S QUANTITAION: 0 %
Hemoglobin Other: 0 %
Hgb A2 Quant: 3.1 % (ref 2.2–3.2)
Hgb F Quant: 0 % (ref 0.0–2.0)

## 2014-06-18 LAB — CULTURE, OB URINE
COLONY COUNT: NO GROWTH
Organism ID, Bacteria: NO GROWTH

## 2014-06-19 NOTE — L&D Delivery Note (Signed)
Patient is 29 y.o. [id number] [redacted]w[redacted]d admitted for IOL 2/2 cHTN, [facility]FA.  Patient developed superimposed preeclampsia once active labor.  Very long induction course with cytotec >pitocin >cytotec>FB>pitocin   Delivery Note At 5:03 AM a viable female was delivered via Vaginal, Spontaneous Delivery (Presentation: Left Occiput Anterior).  APGAR: 9, 9; weight 5 lb 15.1 oz (2696 g).   Placenta status: Intact, Spontaneous.  Cord: 3 vessels with the following complications: None.  Anesthesia: Epidural  Episiotomy: None Lacerations: 1st degree;Perineal;Sulcus Suture Repair: 3.0 vicryl rapide Est. Blood Loss (mL): 294  Mom to AICU for 24h magnesium for superimposed preeclampsia.  Baby to nursery with adoptive mother.  Marshel Golubski ROCIO 11/15/2014, 7:41 AM

## 2014-06-21 LAB — CANNABANOIDS (GC/LC/MS), URINE: THC-COOH UR CONFIRM: 409 ng/mL — AB (ref <5)

## 2014-06-23 ENCOUNTER — Encounter: Payer: Self-pay | Admitting: Family Medicine

## 2014-06-23 DIAGNOSIS — F1290 Cannabis use, unspecified, uncomplicated: Secondary | ICD-10-CM | POA: Insufficient documentation

## 2014-06-23 LAB — PRESCRIPTION MONITORING PROFILE (19 PANEL)
AMPHETAMINE/METH: NEGATIVE ng/mL
BARBITURATE SCREEN, URINE: NEGATIVE ng/mL
BUPRENORPHINE, URINE: NEGATIVE ng/mL
Benzodiazepine Screen, Urine: NEGATIVE ng/mL
CARISOPRODOL, URINE: NEGATIVE ng/mL
Cocaine Metabolites: NEGATIVE ng/mL
Creatinine, Urine: 129.84 mg/dL (ref >20.0)
Fentanyl, Ur: NEGATIVE ng/mL
MDMA URINE: NEGATIVE ng/mL
Meperidine, Ur: NEGATIVE ng/mL
Methadone Screen, Urine: NEGATIVE ng/mL
Methaqualone: NEGATIVE ng/mL
NITRITES URINE, INITIAL: NEGATIVE ug/mL
OPIATE SCREEN, URINE: NEGATIVE ng/mL
Oxycodone Screen, Ur: NEGATIVE ng/mL
PHENCYCLIDINE, UR: NEGATIVE ng/mL
PROPOXYPHENE: NEGATIVE ng/mL
TAPENTADOLUR: NEGATIVE ng/mL
Tramadol Scrn, Ur: NEGATIVE ng/mL
Zolpidem, Urine: NEGATIVE ng/mL
pH, Initial: 6 pH (ref 4.5–8.9)

## 2014-06-30 ENCOUNTER — Other Ambulatory Visit: Payer: Medicaid Other

## 2014-06-30 ENCOUNTER — Ambulatory Visit (HOSPITAL_COMMUNITY)
Admission: RE | Admit: 2014-06-30 | Discharge: 2014-06-30 | Disposition: A | Discharge status: Home / Self Care | Payer: Medicaid Other | Source: Ambulatory Visit | Attending: Family Medicine | Admitting: Family Medicine

## 2014-06-30 DIAGNOSIS — O10919 Unspecified pre-existing hypertension complicating pregnancy, unspecified trimester: Secondary | ICD-10-CM

## 2014-06-30 DIAGNOSIS — Z3689 Encounter for other specified antenatal screening: Secondary | ICD-10-CM | POA: Insufficient documentation

## 2014-06-30 DIAGNOSIS — Z36 Encounter for antenatal screening of mother: Secondary | ICD-10-CM | POA: Insufficient documentation

## 2014-06-30 DIAGNOSIS — Z3A20 20 weeks gestation of pregnancy: Secondary | ICD-10-CM | POA: Insufficient documentation

## 2014-06-30 DIAGNOSIS — D279 Benign neoplasm of unspecified ovary: Secondary | ICD-10-CM | POA: Insufficient documentation

## 2014-06-30 DIAGNOSIS — O0992 Supervision of high risk pregnancy, unspecified, second trimester: Secondary | ICD-10-CM

## 2014-06-30 LAB — COMPREHENSIVE METABOLIC PANEL WITH GFR
ALT: 24 U/L (ref 0–35)
AST: 21 U/L (ref 0–37)
Albumin: 3.6 g/dL (ref 3.5–5.2)
Alkaline Phosphatase: 54 U/L (ref 39–117)
BUN: 4 mg/dL — ABNORMAL LOW (ref 6–23)
CO2: 21 meq/L (ref 19–32)
Calcium: 8.9 mg/dL (ref 8.4–10.5)
Chloride: 105 meq/L (ref 96–112)
Creat: 0.38 mg/dL — ABNORMAL LOW (ref 0.50–1.10)
Glucose, Bld: 71 mg/dL (ref 70–99)
Potassium: 4.4 meq/L (ref 3.5–5.3)
Sodium: 134 meq/L — ABNORMAL LOW (ref 135–145)
Total Bilirubin: 0.4 mg/dL (ref 0.2–1.2)
Total Protein: 6.4 g/dL (ref 6.0–8.3)

## 2014-07-01 LAB — PROTEIN, URINE, 24 HOUR
PROTEIN 24H UR: 81 mg/d (ref <150)
Protein, Urine: 6 mg/dL (ref 5–24)

## 2014-07-07 ENCOUNTER — Telehealth: Payer: Self-pay | Admitting: *Deleted

## 2014-07-07 DIAGNOSIS — Z3490 Encounter for supervision of normal pregnancy, unspecified, unspecified trimester: Secondary | ICD-10-CM | POA: Insufficient documentation

## 2014-07-07 NOTE — Telephone Encounter (Addendum)
Caller Candie Mile from [employer] left message stating that she had faxed a signed release of information in order to obtain medical records of pt. She is checking to be sure the fax was received and to find out the status of her request.  I returned the call and Jenny Reichmann was not available. I advised her co-worker that the fax has been received and the requested records will be faxed within the next few days.

## 2014-07-14 ENCOUNTER — Ambulatory Visit (INDEPENDENT_AMBULATORY_CARE_PROVIDER_SITE_OTHER): Payer: Medicaid Other | Admitting: Physician Assistant

## 2014-07-14 VITALS — BP 109/68 | HR 99 | Temp 97.8°F | Wt 207.8 lb

## 2014-07-14 DIAGNOSIS — Z3492 Encounter for supervision of normal pregnancy, unspecified, second trimester: Secondary | ICD-10-CM

## 2014-07-14 LAB — POCT URINALYSIS DIP (DEVICE)
Bilirubin Urine: NEGATIVE
Glucose, UA: NEGATIVE mg/dL
Hgb urine dipstick: NEGATIVE
Nitrite: NEGATIVE
PROTEIN: 30 mg/dL — AB
Specific Gravity, Urine: >=1.03 (ref 1.005–1.030)
UROBILINOGEN UA: 1 mg/dL (ref 0.0–1.0)
pH: 6 (ref 5.0–8.0)

## 2014-07-14 NOTE — Progress Notes (Signed)
Reports good FM -- states "I have an MMA fighter in there"-- and pelvic discomfort.

## 2014-07-14 NOTE — Patient Instructions (Signed)
Second Trimester of Pregnancy The second trimester is from week 13 through week 28, months 4 through 6. The second trimester is often a time when you feel your best. Your body has also adjusted to being pregnant, and you begin to feel better physically. Usually, morning sickness has lessened or quit completely, you may have more energy, and you may have an increase in appetite. The second trimester is also a time when the fetus is growing rapidly. At the end of the sixth month, the fetus is about 9 inches long and weighs about 1 pounds. You will likely begin to feel the baby move (quickening) between 18 and 20 weeks of the pregnancy. BODY CHANGES Your body goes through many changes during pregnancy. The changes vary from woman to woman.   Your weight will continue to increase. You will notice your lower abdomen bulging out.  You may begin to get stretch marks on your hips, abdomen, and breasts.  You may develop headaches that can be relieved by medicines approved by your health care provider.  You may urinate more often because the fetus is pressing on your bladder.  You may develop or continue to have heartburn as a result of your pregnancy.  You may develop constipation because certain hormones are causing the muscles that push waste through your intestines to slow down.  You may develop hemorrhoids or swollen, bulging veins (varicose veins).  You may have back pain because of the weight gain and pregnancy hormones relaxing your joints between the bones in your pelvis and as a result of a shift in weight and the muscles that support your balance.  Your breasts will continue to grow and be tender.  Your gums may bleed and may be sensitive to brushing and flossing.  Dark spots or blotches (chloasma, mask of pregnancy) may develop on your face. This will likely fade after the baby is born.  A dark line from your belly button to the pubic area (linea nigra) may appear. This will likely fade  after the baby is born.  You may have changes in your hair. These can include thickening of your hair, rapid growth, and changes in texture. Some women also have hair loss during or after pregnancy, or hair that feels dry or thin. Your hair will most likely return to normal after your baby is born. WHAT TO EXPECT AT YOUR PRENATAL VISITS During a routine prenatal visit:  You will be weighed to make sure you and the fetus are growing normally.  Your blood pressure will be taken.  Your abdomen will be measured to track your baby's growth.  The fetal heartbeat will be listened to.  Any test results from the previous visit will be discussed. Your health care provider may ask you:  How you are feeling.  If you are feeling the baby move.  If you have had any abnormal symptoms, such as leaking fluid, bleeding, severe headaches, or abdominal cramping.  If you have any questions. Other tests that may be performed during your second trimester include:  Blood tests that check for:  Low iron levels (anemia).  Gestational diabetes (between 24 and 28 weeks).  Rh antibodies.  Urine tests to check for infections, diabetes, or protein in the urine.  An ultrasound to confirm the proper growth and development of the baby.  An amniocentesis to check for possible genetic problems.  Fetal screens for spina bifida and Down syndrome. HOME CARE INSTRUCTIONS   Avoid all smoking, herbs, alcohol, and unprescribed   drugs. These chemicals affect the formation and growth of the baby.  Follow your health care provider's instructions regarding medicine use. There are medicines that are either safe or unsafe to take during pregnancy.  Exercise only as directed by your health care provider. Experiencing uterine cramps is a good sign to stop exercising.  Continue to eat regular, healthy meals.  Wear a good support bra for breast tenderness.  Do not use hot tubs, steam rooms, or saunas.  Wear your  seat belt at all times when driving.  Avoid raw meat, uncooked cheese, cat litter boxes, and soil used by cats. These carry germs that can cause birth defects in the baby.  Take your prenatal vitamins.  Try taking a stool softener (if your health care provider approves) if you develop constipation. Eat more high-fiber foods, such as fresh vegetables or fruit and whole grains. Drink plenty of fluids to keep your urine clear or pale yellow.  Take warm sitz baths to soothe any pain or discomfort caused by hemorrhoids. Use hemorrhoid cream if your health care provider approves.  If you develop varicose veins, wear support hose. Elevate your feet for 15 minutes, 3-4 times a day. Limit salt in your diet.  Avoid heavy lifting, wear low heel shoes, and practice good posture.  Rest with your legs elevated if you have leg cramps or low back pain.  Visit your dentist if you have not gone yet during your pregnancy. Use a soft toothbrush to brush your teeth and be gentle when you floss.  A sexual relationship may be continued unless your health care provider directs you otherwise.  Continue to go to all your prenatal visits as directed by your health care provider. SEEK MEDICAL CARE IF:   You have dizziness.  You have mild pelvic cramps, pelvic pressure, or nagging pain in the abdominal area.  You have persistent nausea, vomiting, or diarrhea.  You have a bad smelling vaginal discharge.  You have pain with urination. SEEK IMMEDIATE MEDICAL CARE IF:   You have a fever.  You are leaking fluid from your vagina.  You have spotting or bleeding from your vagina.  You have severe abdominal cramping or pain.  You have rapid weight gain or loss.  You have shortness of breath with chest pain.  You notice sudden or extreme swelling of your face, hands, ankles, feet, or legs.  You have not felt your baby move in over an hour.  You have severe headaches that do not go away with  medicine.  You have vision changes. Document Released: 05/30/2001 Document Revised: 06/10/2013 Document Reviewed: 08/06/2012 [employer] Patient Information 2015 [employer]. This information is not intended to replace advice given to you by your health care provider. Make sure you discuss any questions you have with your health care provider.  

## 2014-07-14 NOTE — Progress Notes (Signed)
22 weeks, stable.  Complaints of tiredness.  Denies LOF, dysuria, vag bleeding.  Endorses good fetal movement.   Considering adoption plan but uncertain.   No problems with HTN yet this pregnancy RTC 4 weeks

## 2014-07-28 ENCOUNTER — Ambulatory Visit (HOSPITAL_COMMUNITY)
Admission: RE | Admit: 2014-07-28 | Discharge: 2014-07-28 | Disposition: A | Discharge status: Home / Self Care | Payer: Medicaid Other | Source: Ambulatory Visit | Attending: Physician Assistant | Admitting: Physician Assistant

## 2014-07-28 DIAGNOSIS — O09292 Supervision of pregnancy with other poor reproductive or obstetric history, second trimester: Secondary | ICD-10-CM | POA: Insufficient documentation

## 2014-07-28 DIAGNOSIS — Z3A24 24 weeks gestation of pregnancy: Secondary | ICD-10-CM | POA: Diagnosis not present

## 2014-07-28 DIAGNOSIS — Z36 Encounter for antenatal screening of mother: Secondary | ICD-10-CM | POA: Insufficient documentation

## 2014-07-28 DIAGNOSIS — Z3492 Encounter for supervision of normal pregnancy, unspecified, second trimester: Secondary | ICD-10-CM

## 2014-07-28 DIAGNOSIS — IMO0002 Reserved for concepts with insufficient information to code with codable children: Secondary | ICD-10-CM | POA: Insufficient documentation

## 2014-07-28 DIAGNOSIS — Z0489 Encounter for examination and observation for other specified reasons: Secondary | ICD-10-CM | POA: Insufficient documentation

## 2014-08-12 ENCOUNTER — Encounter: Payer: Self-pay | Admitting: Advanced Practice Midwife

## 2014-08-12 ENCOUNTER — Ambulatory Visit (INDEPENDENT_AMBULATORY_CARE_PROVIDER_SITE_OTHER): Payer: Medicaid Other | Admitting: Advanced Practice Midwife

## 2014-08-12 ENCOUNTER — Encounter: Payer: Self-pay | Admitting: *Deleted

## 2014-08-12 VITALS — BP 111/82 | HR 83 | Wt 211.9 lb

## 2014-08-12 DIAGNOSIS — Z3482 Encounter for supervision of other normal pregnancy, second trimester: Secondary | ICD-10-CM

## 2014-08-12 DIAGNOSIS — O10919 Unspecified pre-existing hypertension complicating pregnancy, unspecified trimester: Secondary | ICD-10-CM

## 2014-08-12 DIAGNOSIS — O10913 Unspecified pre-existing hypertension complicating pregnancy, third trimester: Secondary | ICD-10-CM

## 2014-08-12 DIAGNOSIS — O26892 Other specified pregnancy related conditions, second trimester: Secondary | ICD-10-CM

## 2014-08-12 DIAGNOSIS — Z23 Encounter for immunization: Secondary | ICD-10-CM

## 2014-08-12 DIAGNOSIS — O0992 Supervision of high risk pregnancy, unspecified, second trimester: Secondary | ICD-10-CM

## 2014-08-12 DIAGNOSIS — Z3480 Encounter for supervision of other normal pregnancy, unspecified trimester: Secondary | ICD-10-CM

## 2014-08-12 DIAGNOSIS — R12 Heartburn: Secondary | ICD-10-CM

## 2014-08-12 DIAGNOSIS — Z3490 Encounter for supervision of normal pregnancy, unspecified, unspecified trimester: Secondary | ICD-10-CM

## 2014-08-12 DIAGNOSIS — F1210 Cannabis abuse, uncomplicated: Secondary | ICD-10-CM

## 2014-08-12 LAB — POCT URINALYSIS DIP (DEVICE)
Bilirubin Urine: NEGATIVE
Glucose, UA: NEGATIVE mg/dL
HGB URINE DIPSTICK: NEGATIVE
Ketones, ur: NEGATIVE mg/dL
NITRITE: NEGATIVE
Protein, ur: NEGATIVE mg/dL
Specific Gravity, Urine: 1.01 (ref 1.005–1.030)
Urobilinogen, UA: 0.2 mg/dL (ref 0.0–1.0)
pH: 6.5 (ref 5.0–8.0)

## 2014-08-12 LAB — CBC
HCT: 34.3 % — ABNORMAL LOW (ref 36.0–46.0)
Hemoglobin: 11.4 g/dL — ABNORMAL LOW (ref 12.0–15.0)
MCH: 33 pg (ref 26.0–34.0)
MCHC: 33.2 g/dL (ref 30.0–36.0)
MCV: 99.4 fL (ref 78.0–100.0)
MPV: 11.6 fL (ref 8.6–12.4)
PLATELETS: 212 10*3/uL (ref 150–400)
RBC: 3.45 MIL/uL — ABNORMAL LOW (ref 3.87–5.11)
RDW: 13 % (ref 11.5–15.5)
WBC: 6.1 10*3/uL (ref 4.0–10.5)

## 2014-08-12 LAB — RPR

## 2014-08-12 MED ORDER — OMEPRAZOLE MAGNESIUM 20 MG PO TBEC: 20.0000 mg | DELAYED_RELEASE_TABLET | Freq: Every day | ORAL | Status: DC

## 2014-08-12 MED ORDER — TETANUS-DIPHTH-ACELL PERTUSSIS 5-2.5-18.5 LF-MCG/0.5 IM SUSP
0.5000 mL | Freq: Once | INTRAMUSCULAR | Status: AC
  Administered 2014-08-12: 0.5 mL via INTRAMUSCULAR

## 2014-08-12 NOTE — Patient Instructions (Addendum)
Tdap Vaccine (Tetanus, Diphtheria, Pertussis): What You Need to Know 1. Why get vaccinated? Tetanus, diphtheria and pertussis can be very serious diseases, even for adolescents and adults. Tdap vaccine can protect us from these diseases. TETANUS (Lockjaw) causes painful muscle tightening and stiffness, usually all over the body.  It can lead to tightening of muscles in the head and neck so you can't open your mouth, swallow, or sometimes even breathe. Tetanus kills about 1 out of 5 people who are infected. DIPHTHERIA can cause a thick coating to form in the back of the throat.  It can lead to breathing problems, paralysis, heart failure, and death. PERTUSSIS (Whooping Cough) causes severe coughing spells, which can cause difficulty breathing, vomiting and disturbed sleep.  It can also lead to weight loss, incontinence, and rib fractures. Up to 2 in 100 adolescents and 5 in 100 adults with pertussis are hospitalized or have complications, which could include pneumonia or death. These diseases are caused by bacteria. Diphtheria and pertussis are spread from person to person through coughing or sneezing. Tetanus enters the body through cuts, scratches, or wounds. Before vaccines, the [place] saw as many as 200,000 cases a year of diphtheria and pertussis, and hundreds of cases of tetanus. Since vaccination began, tetanus and diphtheria have dropped by about 99% and pertussis by about 80%. 2. Tdap vaccine Tdap vaccine can protect adolescents and adults from tetanus, diphtheria, and pertussis. One dose of Tdap is routinely given at age 11 or 12. People who did not get Tdap at that age should get it as soon as possible. Tdap is especially important for health care professionals and anyone having close contact with a baby younger than 12 months. Pregnant women should get a dose of Tdap during every pregnancy, to protect the newborn from pertussis. Infants are most at risk for severe, life-threatening  complications from pertussis. A similar vaccine, called Td, protects from tetanus and diphtheria, but not pertussis. A Td booster should be given every 10 years. Tdap may be given as one of these boosters if you have not already gotten a dose. Tdap may also be given after a severe cut or burn to prevent tetanus infection. Your doctor can give you more information. Tdap may safely be given at the same time as other vaccines. 3. Some people should not get this vaccine  If you ever had a life-threatening allergic reaction after a dose of any tetanus, diphtheria, or pertussis containing vaccine, OR if you have a severe allergy to any part of this vaccine, you should not get Tdap. Tell your doctor if you have any severe allergies.  If you had a coma, or long or multiple seizures within 7 days after a childhood dose of DTP or DTaP, you should not get Tdap, unless a cause other than the vaccine was found. You can still get Td.  Talk to your doctor if you:  have epilepsy or another nervous system problem,  had severe pain or swelling after any vaccine containing diphtheria, tetanus or pertussis,  ever had Guillain-Barr Syndrome (GBS),  aren't feeling well on the day the shot is scheduled. 4. Risks of a vaccine reaction With any medicine, including vaccines, there is a chance of side effects. These are usually mild and go away on their own, but serious reactions are also possible. Brief fainting spells can follow a vaccination, leading to injuries from falling. Sitting or lying down for about 15 minutes can help prevent these. Tell your doctor if you feel   dizzy or light-headed, or have vision changes or ringing in the ears. Mild problems following Tdap (Did not interfere with activities)  Pain where the shot was given (about 3 in 4 adolescents or 2 in 3 adults)  Redness or swelling where the shot was given (about 1 person in 5)  Mild fever of at least 100.75F (up to about 1 in 25 adolescents or  1 in 100 adults)  Headache (about 3 or 4 people in 10)  Tiredness (about 1 person in 3 or 4)  Nausea, vomiting, diarrhea, stomach ache (up to 1 in 4 adolescents or 1 in 10 adults)  Chills, body aches, sore joints, rash, swollen glands (uncommon) Moderate problems following Tdap (Interfered with activities, but did not require medical attention)  Pain where the shot was given (about 1 in 5 adolescents or 1 in 100 adults)  Redness or swelling where the shot was given (up to about 1 in 16 adolescents or 1 in 25 adults)  Fever over 102F (about 1 in 100 adolescents or 1 in 250 adults)  Headache (about 3 in 20 adolescents or 1 in 10 adults)  Nausea, vomiting, diarrhea, stomach ache (up to 1 or 3 people in 100)  Swelling of the entire arm where the shot was given (up to about 3 in 100). Severe problems following Tdap (Unable to perform usual activities; required medical attention)  Swelling, severe pain, bleeding and redness in the arm where the shot was given (rare). A severe allergic reaction could occur after any vaccine (estimated less than 1 in a million doses). 5. What if there is a serious reaction? What should I look for?  Look for anything that concerns you, such as signs of a severe allergic reaction, very high fever, or behavior changes. Signs of a severe allergic reaction can include hives, swelling of the face and throat, difficulty breathing, a fast heartbeat, dizziness, and weakness. These would start a few minutes to a few hours after the vaccination. What should I do?  If you think it is a severe allergic reaction or other emergency that can't wait, call 9-1-1 or get the person to the nearest hospital. Otherwise, call your doctor.  Afterward, the reaction should be reported to the "Vaccine Adverse Event Reporting System" (VAERS). Your doctor might file this report, or you can do it yourself through the VAERS web site at www.vaers.SamedayNews.es, or by calling  551-355-4766. VAERS is only for reporting reactions. They do not give medical advice.  6. The National Vaccine Injury Compensation Program The [employer] Vaccine Injury Compensation Program (VICP) is a federal program that was created to compensate people who may have been injured by certain vaccines. Persons who believe they may have been injured by a vaccine can learn about the program and about filing a claim by calling 321-532-5617 or visiting the [place] website at GoldCloset.com.ee. 7. How can I learn more?  Ask your doctor.  Call your local or state health department.  Contact the Centers for Disease Control and Prevention (CDC):  Call 563-172-8617 or visit CDC's website at http://hunter.com/. CDC Tdap Vaccine VIS (10/26/11) Document Released: 12/05/2011 Document Revised: 10/20/2013 Document Reviewed: 09/17/2013 ExitCare Patient Information 2015 [place], [place]. This information is not intended to replace advice given to you by your health care provider. Make sure you discuss any questions you have with your health care provider.  Food Choices for Gastroesophageal Reflux Disease When you have gastroesophageal reflux disease (GERD), the foods you eat and your eating habits are very important.  Choosing the right foods can help ease the discomfort of GERD. WHAT GENERAL GUIDELINES DO I NEED TO FOLLOW?  Choose fruits, vegetables, whole grains, low-fat dairy products, and low-fat meat, fish, and poultry.  Limit fats such as oils, salad dressings, butter, nuts, and avocado.  Keep a food diary to identify foods that cause symptoms.  Avoid foods that cause reflux. These may be different for different people.  Eat frequent small meals instead of three large meals each day.  Eat your meals slowly, in a relaxed setting.  Limit fried foods.  Cook foods using methods other than frying.  Avoid drinking alcohol.  Avoid drinking large amounts of liquids with your  meals.  Avoid bending over or lying down until 2-3 hours after eating. WHAT FOODS ARE NOT RECOMMENDED? The following are some foods and drinks that may worsen your symptoms: Vegetables Tomatoes. Tomato juice. Tomato and spaghetti sauce. Chili peppers. Onion and garlic. Horseradish. Fruits Oranges, grapefruit, and lemon (fruit and juice). Meats High-fat meats, fish, and poultry. This includes hot dogs, ribs, ham, sausage, salami, and bacon. Dairy Whole milk and chocolate milk. Sour cream. Cream. Butter. Ice cream. Cream cheese.  Beverages Coffee and tea, with or without caffeine. Carbonated beverages or energy drinks. Condiments Hot sauce. Barbecue sauce.  Sweets/Desserts Chocolate and cocoa. Donuts. Peppermint and spearmint. Fats and Oils High-fat foods, including [place] fries and potato chips. Other Vinegar. Strong spices, such as black pepper, white pepper, red pepper, cayenne, curry powder, cloves, ginger, and chili powder. The items listed above may not be a complete list of foods and beverages to avoid. Contact your dietitian for more information. Document Released: 06/05/2005 Document Revised: 06/10/2013 Document Reviewed: 04/09/2013 [facility] Patient Information 2015 [place], [place]. This information is not intended to replace advice given to you by your health care provider. Make sure you discuss any questions you have with your health care provider.

## 2014-08-12 NOTE — Progress Notes (Signed)
Ultrasound scheduled for 3/16

## 2014-08-12 NOTE — Addendum Note (Signed)
Addended by: Rutherford Nail E on: 08/12/2014 12:01 PM   Modules accepted: Orders

## 2014-08-12 NOTE — Progress Notes (Signed)
TDaP today. Heartburn-Prilosec. 28 week labs. Encouraged LARC.  Normal growth [name]. Growth [name] order. Uncertain if pt had true CHTN.

## 2014-08-13 LAB — HIV ANTIBODY (ROUTINE TESTING W REFLEX): HIV 1&2 Ab, 4th Generation: NONREACTIVE

## 2014-08-13 LAB — GLUCOSE TOLERANCE, 1 HOUR (50G) W/O FASTING: GLUCOSE 1 HOUR GTT: 149 mg/dL — AB (ref 70–140)

## 2014-08-20 ENCOUNTER — Telehealth: Payer: Self-pay | Admitting: *Deleted

## 2014-08-20 NOTE — Telephone Encounter (Addendum)
-----   Message from [place], [place] sent at 08/19/2014 11:24 PM EST ----- Needs 3 hour GTT.  3/3 0810  Called pt and informed her of 1hr GTT results and need for 3hr test. Pt voiced understanding and agreed to appt on 3/8 @ 0800.  Pt also requested to change her appt on 3/9 to 5/88 due to conflict with another appt. I gave her appt time of 1025 for 3/10 and she accepted.

## 2014-08-21 ENCOUNTER — Encounter: Payer: Self-pay | Admitting: *Deleted

## 2014-08-25 ENCOUNTER — Other Ambulatory Visit: Payer: Medicaid Other

## 2014-08-25 DIAGNOSIS — R7309 Other abnormal glucose: Secondary | ICD-10-CM

## 2014-08-26 ENCOUNTER — Telehealth: Payer: Self-pay | Admitting: *Deleted

## 2014-08-26 ENCOUNTER — Encounter: Payer: Medicaid Other | Admitting: Physician Assistant

## 2014-08-26 LAB — GLUCOSE TOLERANCE, 3 HOURS
GLUCOSE, 1 HOUR-GESTATIONAL: 153 mg/dL (ref 70–189)
Glucose Tolerance, 2 hour: 102 mg/dL (ref 70–164)
Glucose Tolerance, Fasting: 70 mg/dL (ref 70–104)
Glucose, GTT - 3 Hour: 32 mg/dL — CL (ref 70–144)

## 2014-08-26 NOTE — Telephone Encounter (Signed)
[facility] labs called to report 3 hr glucose critical lab. Fasting=70, 1 hr=153, 2 hour =102, 3 hr=32.  Was drawn 08/25/14 routine 28 week lab. Reported to [place], [place]. No orders. Will call patient to discuss.

## 2014-08-26 NOTE — Telephone Encounter (Signed)
Called [place] and discussed results with her. She reports she did feel "funny-weak 'yesterday after the  3hr but went and got a snack at the machines while she waited on her ride,and then went and bought and ate lunch and felt fine. States she does not have episodes of feeling shaky or weak, and states she doesn't usually go that long without eating- usually snacks/ eats often. We discussed if she does feel shaky or weak to eat a snack.

## 2014-08-27 ENCOUNTER — Ambulatory Visit (INDEPENDENT_AMBULATORY_CARE_PROVIDER_SITE_OTHER): Payer: Medicaid Other | Admitting: Family Medicine

## 2014-08-27 VITALS — BP 137/85 | HR 96 | Temp 98.4°F | Wt 216.7 lb

## 2014-08-27 DIAGNOSIS — O10913 Unspecified pre-existing hypertension complicating pregnancy, third trimester: Secondary | ICD-10-CM

## 2014-08-27 DIAGNOSIS — O0993 Supervision of high risk pregnancy, unspecified, third trimester: Secondary | ICD-10-CM

## 2014-08-27 LAB — POCT URINALYSIS DIP (DEVICE)
BILIRUBIN URINE: NEGATIVE
GLUCOSE, UA: NEGATIVE mg/dL
Hgb urine dipstick: NEGATIVE
Ketones, ur: NEGATIVE mg/dL
Nitrite: NEGATIVE
PH: 7 (ref 5.0–8.0)
PROTEIN: NEGATIVE mg/dL
Specific Gravity, Urine: 1.02 (ref 1.005–1.030)
UROBILINOGEN UA: 0.2 mg/dL (ref 0.0–1.0)

## 2014-08-27 MED ORDER — MONTELUKAST SODIUM 10 MG PO TABS: 10.0000 mg | ORAL_TABLET | Freq: Every day | ORAL | Status: DC

## 2014-08-27 MED ORDER — MOMETASONE FUROATE 50 MCG/ACT NA SUSP: 2.0000 | Freq: Every day | NASAL | Status: DC

## 2014-08-27 NOTE — Progress Notes (Signed)
Patient is 29 y.o. G2P1001 [redacted]w[redacted]d.  +FM, denies LOF, VB, contractions, vaginal discharge.  Overall feeling well.  Still unsure about birth control.  Also undecided about giving BUFA, reports she wants more children in future but just not now. - regular headaches 2/2 allergies, zyrtec previously worked well => would like to try singulair as previously worked well, and nasal spray => rx singulair 10mg , nasonex - growth sono requested

## 2014-08-27 NOTE — Progress Notes (Signed)
Pt states that Zyrtec is not relieving her allergy sx.

## 2014-08-27 NOTE — Addendum Note (Signed)
Addended by: Nila Nephew on: 08/27/2014 11:06 AM   Modules accepted: Level of Service

## 2014-08-27 NOTE — Progress Notes (Signed)
Pt reminded of Growth U/S 09/02/14 @ 11a with [place].

## 2014-09-02 ENCOUNTER — Ambulatory Visit (HOSPITAL_COMMUNITY)
Admission: RE | Admit: 2014-09-02 | Discharge: 2014-09-02 | Disposition: A | Discharge status: Home / Self Care | Payer: Medicaid Other | Source: Ambulatory Visit | Attending: Advanced Practice Midwife | Admitting: Advanced Practice Midwife

## 2014-09-02 ENCOUNTER — Other Ambulatory Visit (HOSPITAL_COMMUNITY): Payer: Self-pay | Admitting: Maternal and Fetal Medicine

## 2014-09-02 ENCOUNTER — Encounter (HOSPITAL_COMMUNITY): Payer: Self-pay

## 2014-09-02 DIAGNOSIS — O09293 Supervision of pregnancy with other poor reproductive or obstetric history, third trimester: Secondary | ICD-10-CM | POA: Diagnosis not present

## 2014-09-02 DIAGNOSIS — O10913 Unspecified pre-existing hypertension complicating pregnancy, third trimester: Secondary | ICD-10-CM

## 2014-09-02 DIAGNOSIS — Z36 Encounter for antenatal screening of mother: Secondary | ICD-10-CM | POA: Insufficient documentation

## 2014-09-02 DIAGNOSIS — J45909 Unspecified asthma, uncomplicated: Secondary | ICD-10-CM

## 2014-09-02 DIAGNOSIS — Z3A29 29 weeks gestation of pregnancy: Secondary | ICD-10-CM | POA: Insufficient documentation

## 2014-09-02 DIAGNOSIS — O0992 Supervision of high risk pregnancy, unspecified, second trimester: Secondary | ICD-10-CM

## 2014-09-02 DIAGNOSIS — O09299 Supervision of pregnancy with other poor reproductive or obstetric history, unspecified trimester: Secondary | ICD-10-CM

## 2014-09-02 DIAGNOSIS — O99519 Diseases of the respiratory system complicating pregnancy, unspecified trimester: Secondary | ICD-10-CM

## 2014-09-10 ENCOUNTER — Ambulatory Visit (INDEPENDENT_AMBULATORY_CARE_PROVIDER_SITE_OTHER): Payer: Medicaid Other | Admitting: Obstetrics & Gynecology

## 2014-09-10 VITALS — BP 132/82 | HR 82 | Temp 98.0°F | Wt 218.0 lb

## 2014-09-10 DIAGNOSIS — O0993 Supervision of high risk pregnancy, unspecified, third trimester: Secondary | ICD-10-CM

## 2014-09-10 DIAGNOSIS — O10913 Unspecified pre-existing hypertension complicating pregnancy, third trimester: Secondary | ICD-10-CM

## 2014-09-10 LAB — POCT URINALYSIS DIP (DEVICE)
Bilirubin Urine: NEGATIVE
Glucose, UA: NEGATIVE mg/dL
Hgb urine dipstick: NEGATIVE
Ketones, ur: NEGATIVE mg/dL
Nitrite: NEGATIVE
PROTEIN: NEGATIVE mg/dL
Specific Gravity, Urine: 1.01 (ref 1.005–1.030)
Urobilinogen, UA: 0.2 mg/dL (ref 0.0–1.0)
pH: 6.5 (ref 5.0–8.0)

## 2014-09-10 NOTE — Progress Notes (Signed)
NST 2/week next week, nl interval growth by [name] 09/01/14, will f/u 3 weeks

## 2014-09-10 NOTE — Patient Instructions (Signed)
Third Trimester of Pregnancy The third trimester is from week 29 through week 42, months 7 through 9. The third trimester is a time when the fetus is growing rapidly. At the end of the ninth month, the fetus is about 20 inches in length and weighs 6-10 pounds.  BODY CHANGES Your body goes through many changes during pregnancy. The changes vary from woman to woman.   Your weight will continue to increase. You can expect to gain 25-35 pounds (11-16 kg) by the end of the pregnancy.  You may begin to get stretch marks on your hips, abdomen, and breasts.  You may urinate more often because the fetus is moving lower into your pelvis and pressing on your bladder.  You may develop or continue to have heartburn as a result of your pregnancy.  You may develop constipation because certain hormones are causing the muscles that push waste through your intestines to slow down.  You may develop hemorrhoids or swollen, bulging veins (varicose veins).  You may have pelvic pain because of the weight gain and pregnancy hormones relaxing your joints between the bones in your pelvis. Backaches may result from overexertion of the muscles supporting your posture.  You may have changes in your hair. These can include thickening of your hair, rapid growth, and changes in texture. Some women also have hair loss during or after pregnancy, or hair that feels dry or thin. Your hair will most likely return to normal after your baby is born.  Your breasts will continue to grow and be tender. A yellow discharge may leak from your breasts called colostrum.  Your belly button may stick out.  You may feel short of breath because of your expanding uterus.  You may notice the fetus "dropping," or moving lower in your abdomen.  You may have a bloody mucus discharge. This usually occurs a few days to a week before labor begins.  Your cervix becomes thin and soft (effaced) near your due date. WHAT TO EXPECT AT YOUR PRENATAL  EXAMS  You will have prenatal exams every 2 weeks until week 36. Then, you will have weekly prenatal exams. During a routine prenatal visit:  You will be weighed to make sure you and the fetus are growing normally.  Your blood pressure is taken.  Your abdomen will be measured to track your baby's growth.  The fetal heartbeat will be listened to.  Any test results from the previous visit will be discussed.  You may have a cervical check near your due date to see if you have effaced. At around 36 weeks, your caregiver will check your cervix. At the same time, your caregiver will also perform a test on the secretions of the vaginal tissue. This test is to determine if a type of bacteria, Group B streptococcus, is present. Your caregiver will explain this further. Your caregiver may ask you:  What your birth plan is.  How you are feeling.  If you are feeling the baby move.  If you have had any abnormal symptoms, such as leaking fluid, bleeding, severe headaches, or abdominal cramping.  If you have any questions. Other tests or screenings that may be performed during your third trimester include:  Blood tests that check for low iron levels (anemia).  Fetal testing to check the health, activity level, and growth of the fetus. Testing is done if you have certain medical conditions or if there are problems during the pregnancy. FALSE LABOR You may feel small, irregular contractions that   eventually go away. These are called Braxton Hicks contractions, or false labor. Contractions may last for hours, days, or even weeks before true labor sets in. If contractions come at regular intervals, intensify, or become painful, it is best to be seen by your caregiver.  SIGNS OF LABOR   Menstrual-like cramps.  Contractions that are 5 minutes apart or less.  Contractions that start on the top of the uterus and spread down to the lower abdomen and back.  A sense of increased pelvic pressure or back  pain.  A watery or bloody mucus discharge that comes from the vagina. If you have any of these signs before the 37th week of pregnancy, call your caregiver right away. You need to go to the hospital to get checked immediately. HOME CARE INSTRUCTIONS   Avoid all smoking, herbs, alcohol, and unprescribed drugs. These chemicals affect the formation and growth of the baby.  Follow your caregiver's instructions regarding medicine use. There are medicines that are either safe or unsafe to take during pregnancy.  Exercise only as directed by your caregiver. Experiencing uterine cramps is a good sign to stop exercising.  Continue to eat regular, healthy meals.  Wear a good support bra for breast tenderness.  Do not use hot tubs, steam rooms, or saunas.  Wear your seat belt at all times when driving.  Avoid raw meat, uncooked cheese, cat litter boxes, and soil used by cats. These carry germs that can cause birth defects in the baby.  Take your prenatal vitamins.  Try taking a stool softener (if your caregiver approves) if you develop constipation. Eat more high-fiber foods, such as fresh vegetables or fruit and whole grains. Drink plenty of fluids to keep your urine clear or pale yellow.  Take warm sitz baths to soothe any pain or discomfort caused by hemorrhoids. Use hemorrhoid cream if your caregiver approves.  If you develop varicose veins, wear support hose. Elevate your feet for 15 minutes, 3-4 times a day. Limit salt in your diet.  Avoid heavy lifting, wear low heal shoes, and practice good posture.  Rest a lot with your legs elevated if you have leg cramps or low back pain.  Visit your dentist if you have not gone during your pregnancy. Use a soft toothbrush to brush your teeth and be gentle when you floss.  A sexual relationship may be continued unless your caregiver directs you otherwise.  Do not travel far distances unless it is absolutely necessary and only with the approval  of your caregiver.  Take prenatal classes to understand, practice, and ask questions about the labor and delivery.  Make a trial run to the hospital.  Pack your hospital bag.  Prepare the baby's nursery.  Continue to go to all your prenatal visits as directed by your caregiver. SEEK MEDICAL CARE IF:  You are unsure if you are in labor or if your water has broken.  You have dizziness.  You have mild pelvic cramps, pelvic pressure, or nagging pain in your abdominal area.  You have persistent nausea, vomiting, or diarrhea.  You have a bad smelling vaginal discharge.  You have pain with urination. SEEK IMMEDIATE MEDICAL CARE IF:   You have a fever.  You are leaking fluid from your vagina.  You have spotting or bleeding from your vagina.  You have severe abdominal cramping or pain.  You have rapid weight loss or gain.  You have shortness of breath with chest pain.  You notice sudden or extreme swelling   of your face, hands, ankles, feet, or legs.  You have not felt your baby move in over an hour.  You have severe headaches that do not go away with medicine.  You have vision changes. Document Released: 05/30/2001 Document Revised: 06/10/2013 Document Reviewed: 08/06/2012 ExitCare Patient Information 2015 [employer]. This information is not intended to replace advice given to you by your health care provider. Make sure you discuss any questions you have with your health care provider.  

## 2014-09-17 ENCOUNTER — Ambulatory Visit (INDEPENDENT_AMBULATORY_CARE_PROVIDER_SITE_OTHER): Payer: Medicaid Other | Admitting: *Deleted

## 2014-09-17 DIAGNOSIS — O10913 Unspecified pre-existing hypertension complicating pregnancy, third trimester: Secondary | ICD-10-CM

## 2014-09-17 LAB — US OB FOLLOW UP

## 2014-09-17 NOTE — Progress Notes (Signed)
NST reviewed and reactive.  Judythe Postema L. Harraway-Smith, M.D., FACOG    

## 2014-09-17 NOTE — Addendum Note (Signed)
Addended by: Langston Reusing on: 09/17/2014 05:06 PM   Modules accepted: Orders

## 2014-09-21 ENCOUNTER — Ambulatory Visit (INDEPENDENT_AMBULATORY_CARE_PROVIDER_SITE_OTHER): Payer: Medicaid Other | Admitting: *Deleted

## 2014-09-21 VITALS — BP 122/78 | HR 87

## 2014-09-21 DIAGNOSIS — O10913 Unspecified pre-existing hypertension complicating pregnancy, third trimester: Secondary | ICD-10-CM

## 2014-09-24 ENCOUNTER — Ambulatory Visit (INDEPENDENT_AMBULATORY_CARE_PROVIDER_SITE_OTHER): Payer: Medicaid Other | Admitting: Obstetrics and Gynecology

## 2014-09-24 ENCOUNTER — Encounter: Payer: Self-pay | Admitting: Obstetrics and Gynecology

## 2014-09-24 VITALS — BP 137/78 | HR 78 | Wt 220.8 lb

## 2014-09-24 DIAGNOSIS — O0993 Supervision of high risk pregnancy, unspecified, third trimester: Secondary | ICD-10-CM | POA: Diagnosis not present

## 2014-09-24 DIAGNOSIS — Z3490 Encounter for supervision of normal pregnancy, unspecified, unspecified trimester: Secondary | ICD-10-CM

## 2014-09-24 DIAGNOSIS — O10913 Unspecified pre-existing hypertension complicating pregnancy, third trimester: Secondary | ICD-10-CM | POA: Diagnosis not present

## 2014-09-24 LAB — POCT URINALYSIS DIP (DEVICE)
BILIRUBIN URINE: NEGATIVE
GLUCOSE, UA: NEGATIVE mg/dL
Hgb urine dipstick: NEGATIVE
KETONES UR: NEGATIVE mg/dL
Nitrite: NEGATIVE
PROTEIN: NEGATIVE mg/dL
Urobilinogen, UA: 0.2 mg/dL (ref 0.0–1.0)
pH: 5 (ref 5.0–8.0)

## 2014-09-24 NOTE — Progress Notes (Signed)
Small leuks in urine.

## 2014-09-24 NOTE — Progress Notes (Signed)
Patient is doing well without complaints. FM/PTL precautions reviewed. Follow up ultrasound scheduled for 4/14 NST reviewed and reactive

## 2014-09-28 ENCOUNTER — Ambulatory Visit (INDEPENDENT_AMBULATORY_CARE_PROVIDER_SITE_OTHER): Payer: Medicaid Other | Admitting: *Deleted

## 2014-09-28 VITALS — BP 128/73 | HR 82

## 2014-09-28 DIAGNOSIS — O10913 Unspecified pre-existing hypertension complicating pregnancy, third trimester: Secondary | ICD-10-CM

## 2014-09-29 NOTE — Progress Notes (Signed)
4/11 NST reviewed and reactive

## 2014-09-30 ENCOUNTER — Ambulatory Visit (HOSPITAL_COMMUNITY): Payer: Medicaid Other

## 2014-10-01 ENCOUNTER — Ambulatory Visit (INDEPENDENT_AMBULATORY_CARE_PROVIDER_SITE_OTHER): Payer: Medicaid Other | Admitting: Family Medicine

## 2014-10-01 ENCOUNTER — Other Ambulatory Visit (HOSPITAL_COMMUNITY): Payer: Self-pay | Admitting: Maternal and Fetal Medicine

## 2014-10-01 ENCOUNTER — Ambulatory Visit (HOSPITAL_COMMUNITY)
Admission: RE | Admit: 2014-10-01 | Discharge: 2014-10-01 | Disposition: A | Discharge status: Home / Self Care | Payer: Medicaid Other | Source: Ambulatory Visit | Attending: Maternal and Fetal Medicine | Admitting: Maternal and Fetal Medicine

## 2014-10-01 VITALS — BP 137/77 | HR 90 | Wt 222.8 lb

## 2014-10-01 DIAGNOSIS — Z36 Encounter for antenatal screening of mother: Secondary | ICD-10-CM | POA: Insufficient documentation

## 2014-10-01 DIAGNOSIS — J45909 Unspecified asthma, uncomplicated: Secondary | ICD-10-CM | POA: Insufficient documentation

## 2014-10-01 DIAGNOSIS — O0993 Supervision of high risk pregnancy, unspecified, third trimester: Secondary | ICD-10-CM | POA: Diagnosis not present

## 2014-10-01 DIAGNOSIS — O10913 Unspecified pre-existing hypertension complicating pregnancy, third trimester: Secondary | ICD-10-CM

## 2014-10-01 DIAGNOSIS — Z3490 Encounter for supervision of normal pregnancy, unspecified, unspecified trimester: Secondary | ICD-10-CM

## 2014-10-01 DIAGNOSIS — O163 Unspecified maternal hypertension, third trimester: Secondary | ICD-10-CM

## 2014-10-01 DIAGNOSIS — O09299 Supervision of pregnancy with other poor reproductive or obstetric history, unspecified trimester: Secondary | ICD-10-CM

## 2014-10-01 DIAGNOSIS — O99519 Diseases of the respiratory system complicating pregnancy, unspecified trimester: Secondary | ICD-10-CM

## 2014-10-01 DIAGNOSIS — O09293 Supervision of pregnancy with other poor reproductive or obstetric history, third trimester: Secondary | ICD-10-CM | POA: Diagnosis not present

## 2014-10-01 DIAGNOSIS — Z3A34 34 weeks gestation of pregnancy: Secondary | ICD-10-CM | POA: Insufficient documentation

## 2014-10-01 LAB — POCT URINALYSIS DIP (DEVICE)
BILIRUBIN URINE: NEGATIVE
GLUCOSE, UA: NEGATIVE mg/dL
Hgb urine dipstick: NEGATIVE
KETONES UR: NEGATIVE mg/dL
Nitrite: NEGATIVE
Protein, ur: NEGATIVE mg/dL
SPECIFIC GRAVITY, URINE: 1.01 (ref 1.005–1.030)
Urobilinogen, UA: 0.2 mg/dL (ref 0.0–1.0)
pH: 7 (ref 5.0–8.0)

## 2014-10-01 NOTE — Progress Notes (Signed)
NST reactive No questions or concerns Heartburn controlled with prilosec. FM/PTL precautions given. [name] today for growth.

## 2014-10-01 NOTE — Progress Notes (Signed)
UA resulted small leukocytes

## 2014-10-01 NOTE — Patient Instructions (Signed)
Third Trimester of Pregnancy The third trimester is from week 29 through week 42, months 7 through 9. The third trimester is a time when the fetus is growing rapidly. At the end of the ninth month, the fetus is about 20 inches in length and weighs 6-10 pounds.  BODY CHANGES Your body goes through many changes during pregnancy. The changes vary from woman to woman.   Your weight will continue to increase. You can expect to gain 25-35 pounds (11-16 kg) by the end of the pregnancy.  You may begin to get stretch marks on your hips, abdomen, and breasts.  You may urinate more often because the fetus is moving lower into your pelvis and pressing on your bladder.  You may develop or continue to have heartburn as a result of your pregnancy.  You may develop constipation because certain hormones are causing the muscles that push waste through your intestines to slow down.  You may develop hemorrhoids or swollen, bulging veins (varicose veins).  You may have pelvic pain because of the weight gain and pregnancy hormones relaxing your joints between the bones in your pelvis. Backaches may result from overexertion of the muscles supporting your posture.  You may have changes in your hair. These can include thickening of your hair, rapid growth, and changes in texture. Some women also have hair loss during or after pregnancy, or hair that feels dry or thin. Your hair will most likely return to normal after your baby is born.  Your breasts will continue to grow and be tender. A yellow discharge may leak from your breasts called colostrum.  Your belly button may stick out.  You may feel short of breath because of your expanding uterus.  You may notice the fetus "dropping," or moving lower in your abdomen.  You may have a bloody mucus discharge. This usually occurs a few days to a week before labor begins.  Your cervix becomes thin and soft (effaced) near your due date. WHAT TO EXPECT AT YOUR PRENATAL  EXAMS  You will have prenatal exams every 2 weeks until week 36. Then, you will have weekly prenatal exams. During a routine prenatal visit:  You will be weighed to make sure you and the fetus are growing normally.  Your blood pressure is taken.  Your abdomen will be measured to track your baby's growth.  The fetal heartbeat will be listened to.  Any test results from the previous visit will be discussed.  You may have a cervical check near your due date to see if you have effaced. At around 36 weeks, your caregiver will check your cervix. At the same time, your caregiver will also perform a test on the secretions of the vaginal tissue. This test is to determine if a type of bacteria, Group B streptococcus, is present. Your caregiver will explain this further. Your caregiver may ask you:  What your birth plan is.  How you are feeling.  If you are feeling the baby move.  If you have had any abnormal symptoms, such as leaking fluid, bleeding, severe headaches, or abdominal cramping.  If you have any questions. Other tests or screenings that may be performed during your third trimester include:  Blood tests that check for low iron levels (anemia).  Fetal testing to check the health, activity level, and growth of the fetus. Testing is done if you have certain medical conditions or if there are problems during the pregnancy. FALSE LABOR You may feel small, irregular contractions that   eventually go away. These are called Braxton Hicks contractions, or false labor. Contractions may last for hours, days, or even weeks before true labor sets in. If contractions come at regular intervals, intensify, or become painful, it is best to be seen by your caregiver.  SIGNS OF LABOR   Menstrual-like cramps.  Contractions that are 5 minutes apart or less.  Contractions that start on the top of the uterus and spread down to the lower abdomen and back.  A sense of increased pelvic pressure or back  pain.  A watery or bloody mucus discharge that comes from the vagina. If you have any of these signs before the 37th week of pregnancy, call your caregiver right away. You need to go to the hospital to get checked immediately. HOME CARE INSTRUCTIONS   Avoid all smoking, herbs, alcohol, and unprescribed drugs. These chemicals affect the formation and growth of the baby.  Follow your caregiver's instructions regarding medicine use. There are medicines that are either safe or unsafe to take during pregnancy.  Exercise only as directed by your caregiver. Experiencing uterine cramps is a good sign to stop exercising.  Continue to eat regular, healthy meals.  Wear a good support bra for breast tenderness.  Do not use hot tubs, steam rooms, or saunas.  Wear your seat belt at all times when driving.  Avoid raw meat, uncooked cheese, cat litter boxes, and soil used by cats. These carry germs that can cause birth defects in the baby.  Take your prenatal vitamins.  Try taking a stool softener (if your caregiver approves) if you develop constipation. Eat more high-fiber foods, such as fresh vegetables or fruit and whole grains. Drink plenty of fluids to keep your urine clear or pale yellow.  Take warm sitz baths to soothe any pain or discomfort caused by hemorrhoids. Use hemorrhoid cream if your caregiver approves.  If you develop varicose veins, wear support hose. Elevate your feet for 15 minutes, 3-4 times a day. Limit salt in your diet.  Avoid heavy lifting, wear low heal shoes, and practice good posture.  Rest a lot with your legs elevated if you have leg cramps or low back pain.  Visit your dentist if you have not gone during your pregnancy. Use a soft toothbrush to brush your teeth and be gentle when you floss.  A sexual relationship may be continued unless your caregiver directs you otherwise.  Do not travel far distances unless it is absolutely necessary and only with the approval  of your caregiver.  Take prenatal classes to understand, practice, and ask questions about the labor and delivery.  Make a trial run to the hospital.  Pack your hospital bag.  Prepare the baby's nursery.  Continue to go to all your prenatal visits as directed by your caregiver. SEEK MEDICAL CARE IF:  You are unsure if you are in labor or if your water has broken.  You have dizziness.  You have mild pelvic cramps, pelvic pressure, or nagging pain in your abdominal area.  You have persistent nausea, vomiting, or diarrhea.  You have a bad smelling vaginal discharge.  You have pain with urination. SEEK IMMEDIATE MEDICAL CARE IF:   You have a fever.  You are leaking fluid from your vagina.  You have spotting or bleeding from your vagina.  You have severe abdominal cramping or pain.  You have rapid weight loss or gain.  You have shortness of breath with chest pain.  You notice sudden or extreme swelling   of your face, hands, ankles, feet, or legs.  You have not felt your baby move in over an hour.  You have severe headaches that do not go away with medicine.  You have vision changes. Document Released: 05/30/2001 Document Revised: 06/10/2013 Document Reviewed: 08/06/2012 ExitCare Patient Information 2015 [employer]. This information is not intended to replace advice given to you by your health care provider. Make sure you discuss any questions you have with your health care provider.  

## 2014-10-02 NOTE — Progress Notes (Signed)
NST reactive.

## 2014-10-05 ENCOUNTER — Ambulatory Visit (INDEPENDENT_AMBULATORY_CARE_PROVIDER_SITE_OTHER): Payer: Medicaid Other | Admitting: *Deleted

## 2014-10-05 VITALS — BP 123/85 | HR 89

## 2014-10-05 DIAGNOSIS — O10913 Unspecified pre-existing hypertension complicating pregnancy, third trimester: Secondary | ICD-10-CM | POA: Diagnosis not present

## 2014-10-05 NOTE — Progress Notes (Signed)
4/18 NST reviewed and reactive

## 2014-10-07 ENCOUNTER — Encounter: Payer: Self-pay | Admitting: Obstetrics & Gynecology

## 2014-10-08 ENCOUNTER — Ambulatory Visit (INDEPENDENT_AMBULATORY_CARE_PROVIDER_SITE_OTHER): Payer: Medicaid Other | Admitting: Family Medicine

## 2014-10-08 VITALS — BP 131/86 | HR 87 | Wt 225.6 lb

## 2014-10-08 DIAGNOSIS — O10913 Unspecified pre-existing hypertension complicating pregnancy, third trimester: Secondary | ICD-10-CM

## 2014-10-08 LAB — POCT URINALYSIS DIP (DEVICE)
Bilirubin Urine: NEGATIVE
Glucose, UA: NEGATIVE mg/dL
HGB URINE DIPSTICK: NEGATIVE
KETONES UR: NEGATIVE mg/dL
Nitrite: NEGATIVE
PROTEIN: NEGATIVE mg/dL
Specific Gravity, Urine: 1.015 (ref 1.005–1.030)
UROBILINOGEN UA: 0.2 mg/dL (ref 0.0–1.0)
pH: 7 (ref 5.0–8.0)

## 2014-10-08 NOTE — Progress Notes (Signed)
Small leuks on Udip

## 2014-10-08 NOTE — Progress Notes (Signed)
OBF/NST/AFI

## 2014-10-08 NOTE — Progress Notes (Signed)
NST reviewed and reactive. Discussed sleep Allergies discussed U/S for growth is scheduled

## 2014-10-08 NOTE — Patient Instructions (Signed)
Third Trimester of Pregnancy The third trimester is from week 29 through week 42, months 7 through 9. The third trimester is a time when the fetus is growing rapidly. At the end of the ninth month, the fetus is about 20 inches in length and weighs 6-10 pounds.  BODY CHANGES Your body goes through many changes during pregnancy. The changes vary from woman to woman.   Your weight will continue to increase. You can expect to gain 25-35 pounds (11-16 kg) by the end of the pregnancy.  You may begin to get stretch marks on your hips, abdomen, and breasts.  You may urinate more often because the fetus is moving lower into your pelvis and pressing on your bladder.  You may develop or continue to have heartburn as a result of your pregnancy.  You may develop constipation because certain hormones are causing the muscles that push waste through your intestines to slow down.  You may develop hemorrhoids or swollen, bulging veins (varicose veins).  You may have pelvic pain because of the weight gain and pregnancy hormones relaxing your joints between the bones in your pelvis. Backaches may result from overexertion of the muscles supporting your posture.  You may have changes in your hair. These can include thickening of your hair, rapid growth, and changes in texture. Some women also have hair loss during or after pregnancy, or hair that feels dry or thin. Your hair will most likely return to normal after your baby is born.  Your breasts will continue to grow and be tender. A yellow discharge may leak from your breasts called colostrum.  Your belly button may stick out.  You may feel short of breath because of your expanding uterus.  You may notice the fetus "dropping," or moving lower in your abdomen.  You may have a bloody mucus discharge. This usually occurs a few days to a week before labor begins.  Your cervix becomes thin and soft (effaced) near your due date. WHAT TO EXPECT AT YOUR PRENATAL  EXAMS  You will have prenatal exams every 2 weeks until week 36. Then, you will have weekly prenatal exams. During a routine prenatal visit:  You will be weighed to make sure you and the fetus are growing normally.  Your blood pressure is taken.  Your abdomen will be measured to track your baby's growth.  The fetal heartbeat will be listened to.  Any test results from the previous visit will be discussed.  You may have a cervical check near your due date to see if you have effaced. At around 36 weeks, your caregiver will check your cervix. At the same time, your caregiver will also perform a test on the secretions of the vaginal tissue. This test is to determine if a type of bacteria, Group B streptococcus, is present. Your caregiver will explain this further. Your caregiver may ask you:  What your birth plan is.  How you are feeling.  If you are feeling the baby move.  If you have had any abnormal symptoms, such as leaking fluid, bleeding, severe headaches, or abdominal cramping.  If you have any questions. Other tests or screenings that may be performed during your third trimester include:  Blood tests that check for low iron levels (anemia).  Fetal testing to check the health, activity level, and growth of the fetus. Testing is done if you have certain medical conditions or if there are problems during the pregnancy. FALSE LABOR You may feel small, irregular contractions that   eventually go away. These are called Braxton Hicks contractions, or false labor. Contractions may last for hours, days, or even weeks before true labor sets in. If contractions come at regular intervals, intensify, or become painful, it is best to be seen by your caregiver.  SIGNS OF LABOR   Menstrual-like cramps.  Contractions that are 5 minutes apart or less.  Contractions that start on the top of the uterus and spread down to the lower abdomen and back.  A sense of increased pelvic pressure or back  pain.  A watery or bloody mucus discharge that comes from the vagina. If you have any of these signs before the 37th week of pregnancy, call your caregiver right away. You need to go to the hospital to get checked immediately. HOME CARE INSTRUCTIONS   Avoid all smoking, herbs, alcohol, and unprescribed drugs. These chemicals affect the formation and growth of the baby.  Follow your caregiver's instructions regarding medicine use. There are medicines that are either safe or unsafe to take during pregnancy.  Exercise only as directed by your caregiver. Experiencing uterine cramps is a good sign to stop exercising.  Continue to eat regular, healthy meals.  Wear a good support bra for breast tenderness.  Do not use hot tubs, steam rooms, or saunas.  Wear your seat belt at all times when driving.  Avoid raw meat, uncooked cheese, cat litter boxes, and soil used by cats. These carry germs that can cause birth defects in the baby.  Take your prenatal vitamins.  Try taking a stool softener (if your caregiver approves) if you develop constipation. Eat more high-fiber foods, such as fresh vegetables or fruit and whole grains. Drink plenty of fluids to keep your urine clear or pale yellow.  Take warm sitz baths to soothe any pain or discomfort caused by hemorrhoids. Use hemorrhoid cream if your caregiver approves.  If you develop varicose veins, wear support hose. Elevate your feet for 15 minutes, 3-4 times a day. Limit salt in your diet.  Avoid heavy lifting, wear low heal shoes, and practice good posture.  Rest a lot with your legs elevated if you have leg cramps or low back pain.  Visit your dentist if you have not gone during your pregnancy. Use a soft toothbrush to brush your teeth and be gentle when you floss.  A sexual relationship may be continued unless your caregiver directs you otherwise.  Do not travel far distances unless it is absolutely necessary and only with the approval  of your caregiver.  Take prenatal classes to understand, practice, and ask questions about the labor and delivery.  Make a trial run to the hospital.  Pack your hospital bag.  Prepare the baby's nursery.  Continue to go to all your prenatal visits as directed by your caregiver. SEEK MEDICAL CARE IF:  You are unsure if you are in labor or if your water has broken.  You have dizziness.  You have mild pelvic cramps, pelvic pressure, or nagging pain in your abdominal area.  You have persistent nausea, vomiting, or diarrhea.  You have a bad smelling vaginal discharge.  You have pain with urination. SEEK IMMEDIATE MEDICAL CARE IF:   You have a fever.  You are leaking fluid from your vagina.  You have spotting or bleeding from your vagina.  You have severe abdominal cramping or pain.  You have rapid weight loss or gain.  You have shortness of breath with chest pain.  You notice sudden or extreme swelling   of your face, hands, ankles, feet, or legs.  You have not felt your baby move in over an hour.  You have severe headaches that do not go away with medicine.  You have vision changes. Document Released: 05/30/2001 Document Revised: 06/10/2013 Document Reviewed: 08/06/2012 ExitCare Patient Information 2015 [employer]. This information is not intended to replace advice given to you by your health care provider. Make sure you discuss any questions you have with your health care provider.  

## 2014-10-12 ENCOUNTER — Ambulatory Visit (INDEPENDENT_AMBULATORY_CARE_PROVIDER_SITE_OTHER): Payer: Medicaid Other | Admitting: *Deleted

## 2014-10-12 VITALS — BP 125/76 | HR 79

## 2014-10-12 DIAGNOSIS — O10913 Unspecified pre-existing hypertension complicating pregnancy, third trimester: Secondary | ICD-10-CM | POA: Diagnosis not present

## 2014-10-12 NOTE — Progress Notes (Signed)
NST performed today was reviewed and was found to be reactive.  Continue recommended antenatal testing and prenatal care.  

## 2014-10-15 ENCOUNTER — Ambulatory Visit (INDEPENDENT_AMBULATORY_CARE_PROVIDER_SITE_OTHER): Payer: Medicaid Other | Admitting: Obstetrics and Gynecology

## 2014-10-15 ENCOUNTER — Encounter: Payer: Self-pay | Admitting: Obstetrics and Gynecology

## 2014-10-15 VITALS — BP 128/83 | HR 77 | Wt 222.7 lb

## 2014-10-15 DIAGNOSIS — O0993 Supervision of high risk pregnancy, unspecified, third trimester: Secondary | ICD-10-CM | POA: Diagnosis not present

## 2014-10-15 DIAGNOSIS — O10913 Unspecified pre-existing hypertension complicating pregnancy, third trimester: Secondary | ICD-10-CM

## 2014-10-15 DIAGNOSIS — Z3490 Encounter for supervision of normal pregnancy, unspecified, unspecified trimester: Secondary | ICD-10-CM

## 2014-10-15 LAB — POCT URINALYSIS DIP (DEVICE)
Bilirubin Urine: NEGATIVE
GLUCOSE, UA: NEGATIVE mg/dL
HGB URINE DIPSTICK: NEGATIVE
Ketones, ur: NEGATIVE mg/dL
Nitrite: NEGATIVE
PROTEIN: NEGATIVE mg/dL
SPECIFIC GRAVITY, URINE: 1.01 (ref 1.005–1.030)
Urobilinogen, UA: 0.2 mg/dL (ref 0.0–1.0)
pH: 7 (ref 5.0–8.0)

## 2014-10-15 NOTE — Progress Notes (Signed)
Patient is doing well without complaints. FM/PTL precautions reviewed. NST reviewed and reactive Cultures next visit

## 2014-10-15 NOTE — Progress Notes (Signed)
Moderate leuks on udip

## 2014-10-19 ENCOUNTER — Ambulatory Visit (INDEPENDENT_AMBULATORY_CARE_PROVIDER_SITE_OTHER): Payer: Medicaid Other | Admitting: *Deleted

## 2014-10-19 VITALS — BP 130/79 | HR 82

## 2014-10-19 DIAGNOSIS — O10913 Unspecified pre-existing hypertension complicating pregnancy, third trimester: Secondary | ICD-10-CM

## 2014-10-22 ENCOUNTER — Ambulatory Visit (INDEPENDENT_AMBULATORY_CARE_PROVIDER_SITE_OTHER): Payer: Medicaid Other | Admitting: Family

## 2014-10-22 VITALS — BP 133/84 | HR 84 | Wt 226.0 lb

## 2014-10-22 DIAGNOSIS — O10913 Unspecified pre-existing hypertension complicating pregnancy, third trimester: Secondary | ICD-10-CM | POA: Diagnosis not present

## 2014-10-22 DIAGNOSIS — O0993 Supervision of high risk pregnancy, unspecified, third trimester: Secondary | ICD-10-CM | POA: Diagnosis present

## 2014-10-22 LAB — OB RESULTS CONSOLE GC/CHLAMYDIA
CHLAMYDIA, DNA PROBE: NEGATIVE
Gonorrhea: NEGATIVE

## 2014-10-22 LAB — POCT URINALYSIS DIP (DEVICE)
Bilirubin Urine: NEGATIVE
GLUCOSE, UA: NEGATIVE mg/dL
HGB URINE DIPSTICK: NEGATIVE
KETONES UR: NEGATIVE mg/dL
Nitrite: NEGATIVE
Protein, ur: NEGATIVE mg/dL
SPECIFIC GRAVITY, URINE: 1.015 (ref 1.005–1.030)
Urobilinogen, UA: 0.2 mg/dL (ref 0.0–1.0)
pH: 6.5 (ref 5.0–8.0)

## 2014-10-22 LAB — US OB LIMITED

## 2014-10-22 LAB — OB RESULTS CONSOLE GBS: STREP GROUP B AG: NEGATIVE

## 2014-10-22 NOTE — Progress Notes (Signed)
Doing well; no questions or concerns. NST reactive today.  GBS and GC and chlamydia collected.

## 2014-10-22 NOTE — Progress Notes (Signed)
Leukocytes: small

## 2014-10-23 LAB — GC/CHLAMYDIA PROBE AMP
CT Probe RNA: NEGATIVE
GC Probe RNA: NEGATIVE

## 2014-10-24 LAB — CULTURE, BETA STREP (GROUP B ONLY)

## 2014-10-26 ENCOUNTER — Ambulatory Visit (INDEPENDENT_AMBULATORY_CARE_PROVIDER_SITE_OTHER): Payer: Medicaid Other | Admitting: *Deleted

## 2014-10-26 VITALS — BP 132/84 | HR 85

## 2014-10-26 DIAGNOSIS — O10913 Unspecified pre-existing hypertension complicating pregnancy, third trimester: Secondary | ICD-10-CM

## 2014-10-27 NOTE — Progress Notes (Signed)
10/26/14 NST reviewed and reactive

## 2014-10-29 ENCOUNTER — Ambulatory Visit (INDEPENDENT_AMBULATORY_CARE_PROVIDER_SITE_OTHER): Payer: Medicaid Other | Admitting: Obstetrics & Gynecology

## 2014-10-29 ENCOUNTER — Ambulatory Visit (HOSPITAL_COMMUNITY)
Admission: RE | Admit: 2014-10-29 | Discharge: 2014-10-29 | Disposition: A | Discharge status: Home / Self Care | Payer: Medicaid Other | Source: Ambulatory Visit | Attending: Obstetrics and Gynecology | Admitting: Obstetrics and Gynecology

## 2014-10-29 VITALS — BP 128/84 | HR 81 | Wt 224.7 lb

## 2014-10-29 DIAGNOSIS — O10913 Unspecified pre-existing hypertension complicating pregnancy, third trimester: Secondary | ICD-10-CM | POA: Insufficient documentation

## 2014-10-29 DIAGNOSIS — O0993 Supervision of high risk pregnancy, unspecified, third trimester: Secondary | ICD-10-CM

## 2014-10-29 LAB — POCT URINALYSIS DIP (DEVICE)
Glucose, UA: NEGATIVE mg/dL
Hgb urine dipstick: NEGATIVE
KETONES UR: NEGATIVE mg/dL
Nitrite: NEGATIVE
PROTEIN: NEGATIVE mg/dL
SPECIFIC GRAVITY, URINE: 1.025 (ref 1.005–1.030)
Urobilinogen, UA: 1 mg/dL (ref 0.0–1.0)
pH: 6 (ref 5.0–8.0)

## 2014-10-29 NOTE — Patient Instructions (Signed)
Third Trimester of Pregnancy The third trimester is from week 29 through week 42, months 7 through 9. The third trimester is a time when the fetus is growing rapidly. At the end of the ninth month, the fetus is about 20 inches in length and weighs 6-10 pounds.  BODY CHANGES Your body goes through many changes during pregnancy. The changes vary from woman to woman.   Your weight will continue to increase. You can expect to gain 25-35 pounds (11-16 kg) by the end of the pregnancy.  You may begin to get stretch marks on your hips, abdomen, and breasts.  You may urinate more often because the fetus is moving lower into your pelvis and pressing on your bladder.  You may develop or continue to have heartburn as a result of your pregnancy.  You may develop constipation because certain hormones are causing the muscles that push waste through your intestines to slow down.  You may develop hemorrhoids or swollen, bulging veins (varicose veins).  You may have pelvic pain because of the weight gain and pregnancy hormones relaxing your joints between the bones in your pelvis. Backaches may result from overexertion of the muscles supporting your posture.  You may have changes in your hair. These can include thickening of your hair, rapid growth, and changes in texture. Some women also have hair loss during or after pregnancy, or hair that feels dry or thin. Your hair will most likely return to normal after your baby is born.  Your breasts will continue to grow and be tender. A yellow discharge may leak from your breasts called colostrum.  Your belly button may stick out.  You may feel short of breath because of your expanding uterus.  You may notice the fetus "dropping," or moving lower in your abdomen.  You may have a bloody mucus discharge. This usually occurs a few days to a week before labor begins.  Your cervix becomes thin and soft (effaced) near your due date. WHAT TO EXPECT AT YOUR PRENATAL  EXAMS  You will have prenatal exams every 2 weeks until week 36. Then, you will have weekly prenatal exams. During a routine prenatal visit:  You will be weighed to make sure you and the fetus are growing normally.  Your blood pressure is taken.  Your abdomen will be measured to track your baby's growth.  The fetal heartbeat will be listened to.  Any test results from the previous visit will be discussed.  You may have a cervical check near your due date to see if you have effaced. At around 36 weeks, your caregiver will check your cervix. At the same time, your caregiver will also perform a test on the secretions of the vaginal tissue. This test is to determine if a type of bacteria, Group B streptococcus, is present. Your caregiver will explain this further. Your caregiver may ask you:  What your birth plan is.  How you are feeling.  If you are feeling the baby move.  If you have had any abnormal symptoms, such as leaking fluid, bleeding, severe headaches, or abdominal cramping.  If you have any questions. Other tests or screenings that may be performed during your third trimester include:  Blood tests that check for low iron levels (anemia).  Fetal testing to check the health, activity level, and growth of the fetus. Testing is done if you have certain medical conditions or if there are problems during the pregnancy. FALSE LABOR You may feel small, irregular contractions that   eventually go away. These are called Braxton Hicks contractions, or false labor. Contractions may last for hours, days, or even weeks before true labor sets in. If contractions come at regular intervals, intensify, or become painful, it is best to be seen by your caregiver.  SIGNS OF LABOR   Menstrual-like cramps.  Contractions that are 5 minutes apart or less.  Contractions that start on the top of the uterus and spread down to the lower abdomen and back.  A sense of increased pelvic pressure or back  pain.  A watery or bloody mucus discharge that comes from the vagina. If you have any of these signs before the 37th week of pregnancy, call your caregiver right away. You need to go to the hospital to get checked immediately. HOME CARE INSTRUCTIONS   Avoid all smoking, herbs, alcohol, and unprescribed drugs. These chemicals affect the formation and growth of the baby.  Follow your caregiver's instructions regarding medicine use. There are medicines that are either safe or unsafe to take during pregnancy.  Exercise only as directed by your caregiver. Experiencing uterine cramps is a good sign to stop exercising.  Continue to eat regular, healthy meals.  Wear a good support bra for breast tenderness.  Do not use hot tubs, steam rooms, or saunas.  Wear your seat belt at all times when driving.  Avoid raw meat, uncooked cheese, cat litter boxes, and soil used by cats. These carry germs that can cause birth defects in the baby.  Take your prenatal vitamins.  Try taking a stool softener (if your caregiver approves) if you develop constipation. Eat more high-fiber foods, such as fresh vegetables or fruit and whole grains. Drink plenty of fluids to keep your urine clear or pale yellow.  Take warm sitz baths to soothe any pain or discomfort caused by hemorrhoids. Use hemorrhoid cream if your caregiver approves.  If you develop varicose veins, wear support hose. Elevate your feet for 15 minutes, 3-4 times a day. Limit salt in your diet.  Avoid heavy lifting, wear low heal shoes, and practice good posture.  Rest a lot with your legs elevated if you have leg cramps or low back pain.  Visit your dentist if you have not gone during your pregnancy. Use a soft toothbrush to brush your teeth and be gentle when you floss.  A sexual relationship may be continued unless your caregiver directs you otherwise.  Do not travel far distances unless it is absolutely necessary and only with the approval  of your caregiver.  Take prenatal classes to understand, practice, and ask questions about the labor and delivery.  Make a trial run to the hospital.  Pack your hospital bag.  Prepare the baby's nursery.  Continue to go to all your prenatal visits as directed by your caregiver. SEEK MEDICAL CARE IF:  You are unsure if you are in labor or if your water has broken.  You have dizziness.  You have mild pelvic cramps, pelvic pressure, or nagging pain in your abdominal area.  You have persistent nausea, vomiting, or diarrhea.  You have a bad smelling vaginal discharge.  You have pain with urination. SEEK IMMEDIATE MEDICAL CARE IF:   You have a fever.  You are leaking fluid from your vagina.  You have spotting or bleeding from your vagina.  You have severe abdominal cramping or pain.  You have rapid weight loss or gain.  You have shortness of breath with chest pain.  You notice sudden or extreme swelling   of your face, hands, ankles, feet, or legs.  You have not felt your baby move in over an hour.  You have severe headaches that do not go away with medicine.  You have vision changes. Document Released: 05/30/2001 Document Revised: 06/10/2013 Document Reviewed: 08/06/2012 ExitCare Patient Information 2015 [employer]. This information is not intended to replace advice given to you by your health care provider. Make sure you discuss any questions you have with your health care provider.  

## 2014-10-29 NOTE — Progress Notes (Signed)
Small leuks on udip.  [name] for growth done today.   IOL scheduled 5/26 @ 0630.  Pt still intends to place baby for adoption. She has a birth plan.

## 2014-10-29 NOTE — Progress Notes (Signed)
NST reactive, may wait to 40 week for delivery

## 2014-11-02 ENCOUNTER — Ambulatory Visit (INDEPENDENT_AMBULATORY_CARE_PROVIDER_SITE_OTHER): Payer: Medicaid Other | Admitting: *Deleted

## 2014-11-02 ENCOUNTER — Ambulatory Visit (HOSPITAL_COMMUNITY)
Admission: RE | Admit: 2014-11-02 | Discharge: 2014-11-02 | Disposition: A | Discharge status: Home / Self Care | Payer: Medicaid Other | Source: Ambulatory Visit | Attending: Family Medicine | Admitting: Family Medicine

## 2014-11-02 VITALS — BP 141/83 | HR 80

## 2014-11-02 DIAGNOSIS — O10913 Unspecified pre-existing hypertension complicating pregnancy, third trimester: Secondary | ICD-10-CM | POA: Diagnosis present

## 2014-11-02 DIAGNOSIS — Z3A38 38 weeks gestation of pregnancy: Secondary | ICD-10-CM | POA: Insufficient documentation

## 2014-11-02 NOTE — Progress Notes (Signed)
NST reviewed and non-reactive-->to [facility] for BPP.

## 2014-11-02 NOTE — Progress Notes (Signed)
Results called to Dr. Kennon Rounds - BPP ordered. Pt taken to [name] dept.

## 2014-11-04 NOTE — Progress Notes (Signed)
NST reactive 10/19/14

## 2014-11-05 ENCOUNTER — Ambulatory Visit (INDEPENDENT_AMBULATORY_CARE_PROVIDER_SITE_OTHER): Payer: Medicaid Other | Admitting: Obstetrics & Gynecology

## 2014-11-05 VITALS — BP 135/85 | HR 89 | Wt 227.5 lb

## 2014-11-05 DIAGNOSIS — O10913 Unspecified pre-existing hypertension complicating pregnancy, third trimester: Secondary | ICD-10-CM

## 2014-11-05 LAB — POCT URINALYSIS DIP (DEVICE)
Bilirubin Urine: NEGATIVE
Glucose, UA: NEGATIVE mg/dL
Hgb urine dipstick: NEGATIVE
Ketones, ur: NEGATIVE mg/dL
NITRITE: NEGATIVE
PROTEIN: NEGATIVE mg/dL
Specific Gravity, Urine: 1.01 (ref 1.005–1.030)
Urobilinogen, UA: 0.2 mg/dL (ref 0.0–1.0)
pH: 7 (ref 5.0–8.0)

## 2014-11-05 NOTE — Progress Notes (Signed)
BPP 8/8 on 5/16 - done due to NR NST.  IOL scheduled 5/26. Pt has a birth plan involving the adoptive parents.

## 2014-11-05 NOTE — Progress Notes (Signed)
NST reactive today, IOL is scheduled

## 2014-11-06 ENCOUNTER — Telehealth (HOSPITAL_COMMUNITY): Payer: Self-pay | Admitting: *Deleted

## 2014-11-06 NOTE — Telephone Encounter (Signed)
Preadmission screen  

## 2014-11-09 ENCOUNTER — Telehealth (HOSPITAL_COMMUNITY): Payer: Self-pay | Admitting: *Deleted

## 2014-11-09 ENCOUNTER — Ambulatory Visit (INDEPENDENT_AMBULATORY_CARE_PROVIDER_SITE_OTHER): Payer: Medicaid Other | Admitting: *Deleted

## 2014-11-09 ENCOUNTER — Encounter (HOSPITAL_COMMUNITY): Payer: Self-pay | Admitting: *Deleted

## 2014-11-09 VITALS — BP 130/90 | HR 86

## 2014-11-09 DIAGNOSIS — O10913 Unspecified pre-existing hypertension complicating pregnancy, third trimester: Secondary | ICD-10-CM | POA: Diagnosis not present

## 2014-11-09 NOTE — Telephone Encounter (Signed)
Preadmission screen  

## 2014-11-09 NOTE — Progress Notes (Signed)
Pt denies H/A or visual disturbances.  IOL scheduled 5/26.

## 2014-11-12 ENCOUNTER — Encounter (HOSPITAL_COMMUNITY): Payer: Self-pay

## 2014-11-12 ENCOUNTER — Inpatient Hospital Stay (HOSPITAL_COMMUNITY)
Admission: RE | Admit: 2014-11-12 | Discharge: 2014-11-17 | DRG: 774 | Disposition: A | Discharge status: Home / Self Care | Payer: Medicaid Other | Source: Ambulatory Visit | Attending: Obstetrics & Gynecology | Admitting: Obstetrics & Gynecology

## 2014-11-12 ENCOUNTER — Other Ambulatory Visit: Payer: Medicaid Other

## 2014-11-12 VITALS — BP 129/77 | HR 68 | Temp 98.3°F | Resp 15 | Ht 68.0 in | Wt 224.8 lb

## 2014-11-12 DIAGNOSIS — O99324 Drug use complicating childbirth: Secondary | ICD-10-CM | POA: Diagnosis present

## 2014-11-12 DIAGNOSIS — O10913 Unspecified pre-existing hypertension complicating pregnancy, third trimester: Secondary | ICD-10-CM | POA: Diagnosis present

## 2014-11-12 DIAGNOSIS — O480 Post-term pregnancy: Secondary | ICD-10-CM | POA: Diagnosis present

## 2014-11-12 DIAGNOSIS — F17210 Nicotine dependence, cigarettes, uncomplicated: Secondary | ICD-10-CM | POA: Diagnosis present

## 2014-11-12 DIAGNOSIS — J45909 Unspecified asthma, uncomplicated: Secondary | ICD-10-CM | POA: Diagnosis present

## 2014-11-12 DIAGNOSIS — O113 Pre-existing hypertension with pre-eclampsia, third trimester: Secondary | ICD-10-CM | POA: Diagnosis present

## 2014-11-12 DIAGNOSIS — F1210 Cannabis abuse, uncomplicated: Secondary | ICD-10-CM | POA: Diagnosis present

## 2014-11-12 DIAGNOSIS — O99284 Endocrine, nutritional and metabolic diseases complicating childbirth: Secondary | ICD-10-CM | POA: Diagnosis present

## 2014-11-12 DIAGNOSIS — O99334 Smoking (tobacco) complicating childbirth: Secondary | ICD-10-CM | POA: Diagnosis present

## 2014-11-12 DIAGNOSIS — O10919 Unspecified pre-existing hypertension complicating pregnancy, unspecified trimester: Secondary | ICD-10-CM | POA: Diagnosis present

## 2014-11-12 DIAGNOSIS — E739 Lactose intolerance, unspecified: Secondary | ICD-10-CM | POA: Diagnosis present

## 2014-11-12 DIAGNOSIS — O9952 Diseases of the respiratory system complicating childbirth: Secondary | ICD-10-CM | POA: Diagnosis present

## 2014-11-12 DIAGNOSIS — O1092 Unspecified pre-existing hypertension complicating childbirth: Secondary | ICD-10-CM | POA: Diagnosis present

## 2014-11-12 DIAGNOSIS — Z3A40 40 weeks gestation of pregnancy: Secondary | ICD-10-CM | POA: Diagnosis not present

## 2014-11-12 DIAGNOSIS — O133 Gestational [pregnancy-induced] hypertension without significant proteinuria, third trimester: Secondary | ICD-10-CM | POA: Diagnosis not present

## 2014-11-12 DIAGNOSIS — O0993 Supervision of high risk pregnancy, unspecified, third trimester: Secondary | ICD-10-CM

## 2014-11-12 LAB — CBC
HCT: 33.1 % — ABNORMAL LOW (ref 36.0–46.0)
Hemoglobin: 11.6 g/dL — ABNORMAL LOW (ref 12.0–15.0)
MCH: 33.2 pg (ref 26.0–34.0)
MCHC: 35 g/dL (ref 30.0–36.0)
MCV: 94.8 fL (ref 78.0–100.0)
PLATELETS: 161 10*3/uL (ref 150–400)
RBC: 3.49 MIL/uL — ABNORMAL LOW (ref 3.87–5.11)
RDW: 13 % (ref 11.5–15.5)
WBC: 5.1 10*3/uL (ref 4.0–10.5)

## 2014-11-12 LAB — URINALYSIS, ROUTINE W REFLEX MICROSCOPIC
Bilirubin Urine: NEGATIVE
GLUCOSE, UA: NEGATIVE mg/dL
Hgb urine dipstick: NEGATIVE
KETONES UR: NEGATIVE mg/dL
NITRITE: NEGATIVE
PH: 7 (ref 5.0–8.0)
PROTEIN: NEGATIVE mg/dL
SPECIFIC GRAVITY, URINE: 1.01 (ref 1.005–1.030)
Urobilinogen, UA: 0.2 mg/dL (ref 0.0–1.0)

## 2014-11-12 LAB — COMPREHENSIVE METABOLIC PANEL
ALT: 16 U/L (ref 14–54)
AST: 18 U/L (ref 15–41)
Albumin: 3.1 g/dL — ABNORMAL LOW (ref 3.5–5.0)
Alkaline Phosphatase: 159 U/L — ABNORMAL HIGH (ref 38–126)
Anion gap: 5 (ref 5–15)
BUN: <5 mg/dL — ABNORMAL LOW (ref 6–20)
CO2: 21 mmol/L — ABNORMAL LOW (ref 22–32)
Calcium: 8.8 mg/dL — ABNORMAL LOW (ref 8.9–10.3)
Chloride: 109 mmol/L (ref 101–111)
Creatinine, Ser: 0.5 mg/dL (ref 0.44–1.00)
GFR calc Af Amer: >60 mL/min (ref >60)
GFR calc non Af Amer: >60 mL/min (ref >60)
GLUCOSE: 92 mg/dL (ref 65–99)
Potassium: 3.8 mmol/L (ref 3.5–5.1)
Sodium: 135 mmol/L (ref 135–145)
Total Bilirubin: 0.3 mg/dL (ref 0.3–1.2)
Total Protein: 6.2 g/dL — ABNORMAL LOW (ref 6.5–8.1)

## 2014-11-12 LAB — TYPE AND SCREEN
ABO/RH(D): B POS
Antibody Screen: NEGATIVE

## 2014-11-12 LAB — URINE MICROSCOPIC-ADD ON

## 2014-11-12 LAB — PROTEIN / CREATININE RATIO, URINE
CREATININE, URINE: 45 mg/dL
PROTEIN CREATININE RATIO: 0.13 mg/mg{creat} (ref 0.00–0.15)
Total Protein, Urine: 6 mg/dL

## 2014-11-12 MED ORDER — OXYTOCIN BOLUS FROM INFUSION
500.0000 mL | INTRAVENOUS | Status: DC
  Administered 2014-11-15: 500 mL via INTRAVENOUS

## 2014-11-12 MED ORDER — MISOPROSTOL 25 MCG QUARTER TABLET
25.0000 ug | ORAL_TABLET | ORAL | Status: DC | PRN
  Administered 2014-11-12 – 2014-11-14 (×5): 25 ug via VAGINAL
  Filled 2014-11-12 (×5): qty 0.25

## 2014-11-12 MED ORDER — LACTATED RINGERS IV SOLN
INTRAVENOUS | Status: DC
  Administered 2014-11-12: 16:00:00 via INTRAVENOUS
  Administered 2014-11-13: 950 mL via INTRAVENOUS

## 2014-11-12 MED ORDER — OXYTOCIN 40 UNITS IN LACTATED RINGERS INFUSION - SIMPLE MED
62.5000 mL/h | INTRAVENOUS | Status: DC
  Filled 2014-11-12: qty 1000

## 2014-11-12 MED ORDER — FLEET ENEMA 7-19 GM/118ML RE ENEM: 1.0000 | ENEMA | RECTAL | Status: DC | PRN

## 2014-11-12 MED ORDER — LIDOCAINE HCL (PF) 1 % IJ SOLN
30.0000 mL | INTRAMUSCULAR | Status: DC | PRN
Start: 2014-11-12 — End: 2014-11-15
  Filled 2014-11-12: qty 30

## 2014-11-12 MED ORDER — OXYCODONE-ACETAMINOPHEN 5-325 MG PO TABS
1.0000 | ORAL_TABLET | ORAL | Status: DC | PRN
  Administered 2014-11-12: 1 via ORAL
  Filled 2014-11-12: qty 1

## 2014-11-12 MED ORDER — OXYCODONE-ACETAMINOPHEN 5-325 MG PO TABS: 2.0000 | ORAL_TABLET | ORAL | Status: DC | PRN

## 2014-11-12 MED ORDER — LACTATED RINGERS IV SOLN: 500.0000 mL | INTRAVENOUS | Status: DC | PRN

## 2014-11-12 MED ORDER — TERBUTALINE SULFATE 1 MG/ML IJ SOLN
0.2500 mg | Freq: Once | INTRAMUSCULAR | Status: AC | PRN
Start: 2014-11-12 — End: 2014-11-12

## 2014-11-12 MED ORDER — ONDANSETRON HCL 4 MG/2ML IJ SOLN
4.0000 mg | Freq: Four times a day (QID) | INTRAMUSCULAR | Status: DC | PRN
  Administered 2014-11-13: 4 mg via INTRAVENOUS
  Filled 2014-11-12: qty 2

## 2014-11-12 MED ORDER — FENTANYL CITRATE (PF) 100 MCG/2ML IJ SOLN
50.0000 ug | INTRAMUSCULAR | Status: DC | PRN
  Administered 2014-11-13 (×2): 100 ug via INTRAVENOUS
  Administered 2014-11-13: 50 ug via INTRAVENOUS
  Administered 2014-11-13 – 2014-11-14 (×3): 100 ug via INTRAVENOUS
  Administered 2014-11-14: 50 ug via INTRAVENOUS
  Administered 2014-11-14 (×2): 100 ug via INTRAVENOUS
  Filled 2014-11-12 (×9): qty 2

## 2014-11-12 MED ORDER — LABETALOL HCL 200 MG PO TABS
200.0000 mg | ORAL_TABLET | Freq: Three times a day (TID) | ORAL | Status: DC
  Administered 2014-11-12 – 2014-11-14 (×7): 200 mg via ORAL
  Filled 2014-11-12 (×9): qty 1
  Filled 2014-11-12: qty 2

## 2014-11-12 MED ORDER — ZOLPIDEM TARTRATE 5 MG PO TABS
5.0000 mg | ORAL_TABLET | Freq: Every evening | ORAL | Status: DC | PRN
  Administered 2014-11-12 – 2014-11-14 (×2): 5 mg via ORAL
  Filled 2014-11-12 (×2): qty 1

## 2014-11-12 MED ORDER — CITRIC ACID-SODIUM CITRATE 334-500 MG/5ML PO SOLN
30.0000 mL | ORAL | Status: DC | PRN
  Filled 2014-11-12: qty 30

## 2014-11-12 MED ORDER — ACETAMINOPHEN 325 MG PO TABS: 650.0000 mg | ORAL_TABLET | ORAL | Status: DC | PRN

## 2014-11-12 NOTE — H&P (Signed)
HPI: [name] [name] is a 29 y.o. year old G46P1001 female at [redacted]w[redacted]d weeks gestation who presents to [facility] for IOL for CHTN, no meds. This newborn will be given up for adoption. Adoptive parents are involved. Patient plans on Depo for birth control.   Clinic [facility] - established care at 18 weeks Prenatal Labs  Dating 10 week [name] Blood type: B/POS/-- (12/29 1614)  Genetic Screen Quad:   neg               NIPS: Antibody:NEG (12/29 1614)  Anatomic [name] Normal  Rubella: 10.70 (12/29 1614)  GTT Third trimester: 149. 3h: 70, 153,102, 33 RPR: NON REAC (02/24 1639)   TDaP vaccine 08/12/14 HBsAg: NEGATIVE (12/29 1614)   Flu vaccine 06/16/14 HIV: NONREACTIVE (02/24 1639)   GBS Neg GBS: Neg  Contraception uncertain Pap:  Baby Food Bottle   Circumcision    Pediatrician NA-BUFA   Support Person     Maternal Medical History:  Reason for admission: Nausea.    OB History    Gravida Para Term Preterm AB TAB SAB Ectopic Multiple Living   2 1 1       1      Past Medical History  Diagnosis Date  . Eczema   . Hypertension   . Seasonal allergies   . Lactose intolerance   . Asthma, allergic     uses inhaler prn - infrequently   Past Surgical History  Procedure Laterality Date  . Wisdom tooth extraction     Family History: family history includes Eczema in her maternal grandmother and mother; Hypertension in her father, maternal aunt, maternal grandfather, maternal grandmother, maternal uncle, mother, and paternal grandmother. She was adopted. Social History:  reports that she has been smoking Cigarettes.  She has been smoking about 0.25 packs per day. She has quit using smokeless tobacco. She reports that she does not drink alcohol or use illicit drugs.   Prenatal Transfer Tool  Maternal Diabetes: No Genetic Screening: Normal Maternal Ultrasounds/Referrals: Normal Fetal Ultrasounds or other Referrals:  None Maternal Substance Abuse:  Yes:  Type: Marijuana Significant Maternal Medications:   None Significant Maternal Lab Results:  Lab values include: Group B Strep negative Other Comments:  CNTN. No Meds. Elevated 1 hour GTT. Normal 3 hour GTT.   Review of Systems  Constitutional: Negative for fever and chills.  Eyes: Negative for blurred vision and double vision.  Respiratory: Negative for shortness of breath.   Cardiovascular: Negative for chest pain and leg swelling.  Gastrointestinal: Negative for nausea and vomiting.  Genitourinary: Negative for dysuria.  Neurological: Negative for dizziness and headaches.  Psychiatric/Behavioral: The patient is nervous/anxious.     Dilation: 1 Effacement (%): Thick Station: -2 Exam by:: Evonnie Dawes, rn Blood pressure 145/92, pulse 81, temperature 97.6 F (36.4 C), temperature source Oral, resp. rate 18, height 5\' 8"  (1.727 m), weight 227 lb (102.967 kg), last menstrual period 03/03/2014, not currently breastfeeding. Maternal Exam:  Abdomen: Fetal presentation: vertex  Pelvis: adequate for delivery.   Cervix: Cervix evaluated by digital exam.     Physical Exam  Constitutional: She is oriented to person, place, and time. She appears well-developed. No distress.  HENT:  Head: Normocephalic and atraumatic.  Eyes: EOM are normal.  Cardiovascular: Normal rate.   Respiratory: Effort normal and breath sounds normal.  GI: There is no tenderness.  gravid  Musculoskeletal: Normal range of motion. She exhibits no edema.  Neurological: She is alert and oriented to person, place, and time.  Skin: Skin is warm and dry.    Prenatal labs: ABO, Rh: --/--/B POS (05/26 1605) Antibody: NEG (05/26 1605) Rubella: 10.70 (12/29 1614) RPR: NON REAC (02/24 1639)  HBsAg: NEGATIVE (12/29 1614)  HIV: NONREACTIVE (02/24 1639)  GBS: Negative (05/05 0000)   Assessment: 1. Labor: IOL 2. Fetal Wellbeing: Category 1 3. Pain Control: None currently 4. GBS: Neg 5. 40.0 week IUP  Plan:  1. Admit to BS per consult with MD 2. Routine L&D  orders 3. Analgesia/anesthesia PRN  4. Pre-E labs 5. Cytotec for IOL  Luiz Blare, DO PGY-1, [facility]  I was present for the exam and agree with above.  [place], [place] 11/12/2014 6:28 PM

## 2014-11-12 NOTE — Progress Notes (Signed)
   Ahja [place] is a 29 y.o. G2P1001 at [redacted]w[redacted]d  admitted for induction of labor due to Hypertension.  Subjective: No C/O.  Feeling a little crampy after last cytotec.  Requests something to help her sleep more comfortably  Objective: Filed Vitals:   11/12/14 1607 11/12/14 1652 11/12/14 1750 11/12/14 2120  BP: 145/92 142/100 140/99 150/96  Pulse: 81 76 76 74  Temp:    97.6 F (36.4 C)  TempSrc:      Resp: 18  18 18   Height:      Weight:        156/91 X2  FHT:  FHR: 130 bpm, variability: moderate,  accelerations:  Present,  decelerations:  Absent UC:   Occasional and mild SVE:  deferred  Labs: Lab Results  Component Value Date   WBC 5.1 11/12/2014   HGB 11.6* 11/12/2014   HCT 33.1* 11/12/2014   MCV 94.8 11/12/2014   PLT 161 11/12/2014    Assessment / Plan: IOL for CHTN (no meds), ripening phase.  D/T borderline severe range BP, will start PO labatelol 200mg  BID Labor: ripening phase. Pt wants to avoid a Foley if possible, so will continue with cytotec Fetal Wellbeing:  Category I Pain Control:  will give a percocet and am ambien Anticipated MOD:  NSVD  CRESENZO-DISHMAN,Cledis Sohn 11/12/2014, 11:02 PM

## 2014-11-12 NOTE — Progress Notes (Signed)
CSW received call from L&D due to patient presenting for induction with an adoption plan.   Patient provided consent for her mother and daughter to be present for CSW visit.  RN also present in order to assist with implementation of patient's adoption plan.  Patient reported that she is confident in her decision to place the baby up for adoption.  She stated that she is working with an Barrister's clerk in [place] ([employer]), and reported that she has created a semi-open adoption.  Patient shared that her attorney is "down the street" and will come to the hospital after the baby is born in order to complete the adoption papers.  Per patient, she was instructed to have the CSW contact the attorney once the infant is born in order to coordinate discharge papers.   Patient reported that the adoptive parents are in town, and she shared that she has not yet informed them that she is at the hospital ready to be induced.  She voiced intention to notify them once she is "settled".  Patient discussed that she is agreeable to allowing them in her room, but shared that she will want them to leave when it is time deliver.  Per patient, the adoptive parents are aware of her wishes and are agreeaeble to her plan.   Patient discussed desire to see the patient for a brief moment before the patient is transferred to the nursery. Patient acknowledged that she is able to visit the baby in the nursery or have the baby brought to her room if desired.  Patient is also aware that she can give the adoptive mother/father the support person band in order to have it become easier for them to visit the baby in the nursery.   Patient denied additional questions, concerns, or needs at this time.  CSW will continue to closely follow and will complete assessment with patient once baby has been born in order to provide the patient with emotional support.

## 2014-11-13 LAB — CBC
HCT: 32.8 % — ABNORMAL LOW (ref 36.0–46.0)
HEMATOCRIT: 33.1 % — AB (ref 36.0–46.0)
HEMOGLOBIN: 11.6 g/dL — AB (ref 12.0–15.0)
Hemoglobin: 11.4 g/dL — ABNORMAL LOW (ref 12.0–15.0)
MCH: 32.9 pg (ref 26.0–34.0)
MCH: 33.6 pg (ref 26.0–34.0)
MCHC: 34.4 g/dL (ref 30.0–36.0)
MCHC: 35.4 g/dL (ref 30.0–36.0)
MCV: 95.7 fL (ref 78.0–100.0)
MCV: 95.1 fL (ref 78.0–100.0)
PLATELETS: 141 10*3/uL — AB (ref 150–400)
Platelets: 141 10*3/uL — ABNORMAL LOW (ref 150–400)
RBC: 3.46 MIL/uL — AB (ref 3.87–5.11)
RBC: 3.45 MIL/uL — AB (ref 3.87–5.11)
RDW: 13 % (ref 11.5–15.5)
RDW: 13 % (ref 11.5–15.5)
WBC: 5.9 10*3/uL (ref 4.0–10.5)
WBC: 5.7 10*3/uL (ref 4.0–10.5)

## 2014-11-13 LAB — RPR: RPR: NONREACTIVE

## 2014-11-13 MED ORDER — FENTANYL 2.5 MCG/ML BUPIVACAINE 1/10 % EPIDURAL INFUSION (WH - ANES)
14.0000 mL/h | INTRAMUSCULAR | Status: DC | PRN
  Administered 2014-11-15: 14 mL/h via EPIDURAL
  Filled 2014-11-13: qty 125

## 2014-11-13 MED ORDER — OXYTOCIN 40 UNITS IN LACTATED RINGERS INFUSION - SIMPLE MED
1.0000 m[IU]/min | INTRAVENOUS | Status: DC
  Administered 2014-11-13: 4 m[IU]/min via INTRAVENOUS
  Administered 2014-11-13: 2 m[IU]/min via INTRAVENOUS
  Administered 2014-11-13: 6 m[IU]/min via INTRAVENOUS
  Administered 2014-11-13: 8 m[IU]/min via INTRAVENOUS
  Administered 2014-11-13: 10 m[IU]/min via INTRAVENOUS
  Filled 2014-11-13: qty 1000

## 2014-11-13 MED ORDER — MISOPROSTOL 50MCG HALF TABLET
50.0000 ug | ORAL_TABLET | ORAL | Status: DC | PRN
  Administered 2014-11-13 – 2014-11-14 (×3): 50 ug via ORAL
  Filled 2014-11-13 (×3): qty 0.5

## 2014-11-13 MED ORDER — PHENYLEPHRINE 40 MCG/ML (10ML) SYRINGE FOR IV PUSH (FOR BLOOD PRESSURE SUPPORT)
80.0000 ug | PREFILLED_SYRINGE | INTRAVENOUS | Status: DC | PRN
  Administered 2014-11-15 (×2): 80 ug via INTRAVENOUS
  Filled 2014-11-13: qty 2
  Filled 2014-11-13: qty 20

## 2014-11-13 MED ORDER — EPHEDRINE 5 MG/ML INJ
10.0000 mg | INTRAVENOUS | Status: DC | PRN
  Administered 2014-11-15: 10 mg via INTRAVENOUS
  Filled 2014-11-13: qty 4
  Filled 2014-11-13: qty 2

## 2014-11-13 MED ORDER — DIPHENHYDRAMINE HCL 50 MG/ML IJ SOLN: 12.5000 mg | INTRAMUSCULAR | Status: DC | PRN

## 2014-11-13 NOTE — Progress Notes (Signed)
   [name] [name] is a 29 y.o. G2P1001 at [redacted]w[redacted]d  admitted for induction of labor due to Hypertension.  Objective: Filed Vitals:   11/12/14 2309 11/13/14 0133 11/13/14 0538 11/13/14 0549  BP: 135/81 145/71 155/98 139/95  Pulse: 86 97 68 66  Temp:  98.8 F (37.1 C)    TempSrc:      Resp: 18 18 20 18   Height:      Weight:          FHT:  FHR: 120 bpm, variability: moderate,  accelerations:  Present,  decelerations:  Absent UC:   regular, every 3 minutes SVE:   Dilation: 2.5 Effacement (%): 60 Station: -2 Exam by:: TWillis RNC  Labs: Lab Results  Component Value Date   WBC 5.1 11/12/2014   HGB 11.6* 11/12/2014   HCT 33.1* 11/12/2014   MCV 94.8 11/12/2014   PLT 161 11/12/2014    Assessment / Plan: IOL for CHTN, ripening phase  Good response from cytotec (pt wants to avoid foley) so will start pitocin next  Labor: Progressing normally Fetal Wellbeing:  Category I Pain Control:  Labor support without medications Anticipated MOD:  NSVD  [name],[name] [name] 11/13/2014, 6:49 AM

## 2014-11-13 NOTE — Progress Notes (Signed)
Labor Progress Note  S: agreeable to FB but then declined  O:  BP 140/94 mmHg  Pulse 72  Temp(Src) 97.7 F (36.5 C) (Oral)  Resp 18  Ht 5\' 8"  (1.727 m)  Wt 227 lb (102.967 kg)  BMI 34.52 kg/m2  LMP 03/03/2014 Cat cat I CVE: 2/50/-2  A&P: 29 y.o. G2P1001 [redacted]w[redacted]d IOL 2/2 cHTN # labor: declined FB, will continue with cytotec  Merla Riches, MD 8:47 PM

## 2014-11-13 NOTE — Progress Notes (Signed)
Eating breakfast 

## 2014-11-13 NOTE — Progress Notes (Signed)
Labor Progress Note  S: feeling some pain but not very bad  O:  BP 156/97 mmHg  Pulse 68  Temp(Src) 97.5 F (36.4 C) (Oral)  Resp 18  Ht 5\' 8"  (1.727 m)  Wt 227 lb (102.967 kg)  BMI 34.52 kg/m2  LMP 03/03/2014 Cat I CVE: Dilation: 3.5 Effacement (%): 80, 70 Cervical Position: Middle, Posterior Station: -2 Presentation: Vertex Exam by:: Dherr rn   A&P: 29 y.o. G2P1001 [redacted]w[redacted]d IOL 2/2 cHTN no medications # labor: s/p cytotec x 4, pitocin x 4 hours => will give break and allow patient to eat.  She is giving baby up for adoption and I would like to make this process as easy for her as possible.     Merla Riches, MD 4:59 PM

## 2014-11-13 NOTE — Progress Notes (Signed)
Zofran 4 MG IVP

## 2014-11-13 NOTE — Progress Notes (Signed)
Pitocin off per Dr Deniece Ree order

## 2014-11-14 ENCOUNTER — Encounter (HOSPITAL_COMMUNITY): Payer: Self-pay

## 2014-11-14 LAB — CBC
HCT: 33 % — ABNORMAL LOW (ref 36.0–46.0)
Hemoglobin: 11.7 g/dL — ABNORMAL LOW (ref 12.0–15.0)
MCH: 33.8 pg (ref 26.0–34.0)
MCHC: 35.5 g/dL (ref 30.0–36.0)
MCV: 95.4 fL (ref 78.0–100.0)
Platelets: 137 K/uL — ABNORMAL LOW (ref 150–400)
RBC: 3.46 MIL/uL — ABNORMAL LOW (ref 3.87–5.11)
RDW: 13 % (ref 11.5–15.5)
WBC: 6.9 K/uL (ref 4.0–10.5)

## 2014-11-14 MED ORDER — OXYTOCIN 40 UNITS IN LACTATED RINGERS INFUSION - SIMPLE MED
1.0000 m[IU]/min | INTRAVENOUS | Status: DC
  Administered 2014-11-14: 2 m[IU]/min via INTRAVENOUS

## 2014-11-14 MED ORDER — LABETALOL HCL 5 MG/ML IV SOLN: 20.0000 mg | INTRAVENOUS | Status: DC | PRN

## 2014-11-14 MED ORDER — TERBUTALINE SULFATE 1 MG/ML IJ SOLN: 0.2500 mg | Freq: Once | INTRAMUSCULAR | Status: AC | PRN

## 2014-11-14 MED ORDER — LORAZEPAM 2 MG/ML IJ SOLN
1.0000 mg | Freq: Once | INTRAMUSCULAR | Status: AC
  Administered 2014-11-14: 1 mg via INTRAVENOUS
  Filled 2014-11-14: qty 0.5

## 2014-11-14 MED ORDER — HYDRALAZINE HCL 20 MG/ML IJ SOLN: 5.0000 mg | INTRAMUSCULAR | Status: DC | PRN

## 2014-11-14 MED ORDER — MISOPROSTOL 200 MCG PO TABS
50.0000 ug | ORAL_TABLET | ORAL | Status: DC | PRN
  Administered 2014-11-14: 50 ug via ORAL
  Filled 2014-11-14: qty 0.5

## 2014-11-14 NOTE — Progress Notes (Signed)
RN frequently adjusting monitors, fetal HR difficult to trace.

## 2014-11-14 NOTE — Progress Notes (Signed)
Labor Progress Note  S: pt premedicated with 132mcg fentanyl and 1mg  ativan and agreeable to FB  O:  BP 154/93 mmHg  Pulse 69  Temp(Src) 98.2 F (36.8 C) (Oral)  Resp 18  Ht 5\' 8"  (1.727 m)  Wt 227 lb (102.967 kg)  BMI 34.52 kg/m2  SpO2 99%  LMP 03/03/2014 Cat cat I CVE: Dilation: 2.5 Effacement (%): 50 Cervical Position: Posterior Station: -3 Presentation: Vertex Exam by::  (Dr. Gerarda Fraction)   A&P: 29 y.o. G2P1001 [redacted]w[redacted]d IOL 2/2 cHTN # labor: FB placed at 2575 without complication  [name] Blizard ROCIO, MD 3:36 PM

## 2014-11-14 NOTE — Progress Notes (Signed)
Labor Progress Note  S: still with cytotec  O:  BP 131/87 mmHg  Pulse 74  Temp(Src) 98.1 F (36.7 C) (Oral)  Resp 18  Ht 5\' 8"  (1.727 m)  Wt 227 lb (102.967 kg)  BMI 34.52 kg/m2  SpO2 99%  LMP 03/03/2014 Cat cat I CVE: Dilation: 2.5 Effacement (%): 50 Cervical Position: Posterior Station: -3 Presentation: Vertex Exam by::  (Dr. Gerarda Fraction)   A&P: 29 y.o. G2P1001 [redacted]w[redacted]d IOL 2/2 cHTN # labor: again declines FB.  S/p cytotec x 7.  Will repeat exam in 4 hours and if indicated pt may be agreeable to FB given longevity of cervical ripening phase  [name] Deese ROCIO, MD 11:00 AM

## 2014-11-15 ENCOUNTER — Telehealth: Payer: Self-pay | Admitting: Certified Nurse Midwife

## 2014-11-15 ENCOUNTER — Inpatient Hospital Stay (HOSPITAL_COMMUNITY): Payer: Medicaid Other | Admitting: Anesthesiology

## 2014-11-15 ENCOUNTER — Encounter (HOSPITAL_COMMUNITY): Payer: Self-pay

## 2014-11-15 DIAGNOSIS — Z3A40 40 weeks gestation of pregnancy: Secondary | ICD-10-CM

## 2014-11-15 DIAGNOSIS — O133 Gestational [pregnancy-induced] hypertension without significant proteinuria, third trimester: Secondary | ICD-10-CM

## 2014-11-15 LAB — CBC
HCT: 32.8 % — ABNORMAL LOW (ref 36.0–46.0)
HEMOGLOBIN: 11.5 g/dL — AB (ref 12.0–15.0)
MCH: 33.4 pg (ref 26.0–34.0)
MCHC: 35.1 g/dL (ref 30.0–36.0)
MCV: 95.3 fL (ref 78.0–100.0)
PLATELETS: 132 10*3/uL — AB (ref 150–400)
RBC: 3.44 MIL/uL — AB (ref 3.87–5.11)
RDW: 12.9 % (ref 11.5–15.5)
WBC: 9.6 10*3/uL (ref 4.0–10.5)

## 2014-11-15 LAB — COMPREHENSIVE METABOLIC PANEL
ALT: 14 U/L (ref 14–54)
ANION GAP: 6 (ref 5–15)
AST: 17 U/L (ref 15–41)
Albumin: 2.8 g/dL — ABNORMAL LOW (ref 3.5–5.0)
Alkaline Phosphatase: 146 U/L — ABNORMAL HIGH (ref 38–126)
BUN: 6 mg/dL (ref 6–20)
CHLORIDE: 107 mmol/L (ref 101–111)
CO2: 21 mmol/L — ABNORMAL LOW (ref 22–32)
Calcium: 8.6 mg/dL — ABNORMAL LOW (ref 8.9–10.3)
Creatinine, Ser: 0.43 mg/dL — ABNORMAL LOW (ref 0.44–1.00)
GFR calc Af Amer: >60 mL/min (ref >60)
GFR calc non Af Amer: >60 mL/min (ref >60)
GLUCOSE: 91 mg/dL (ref 65–99)
Potassium: 3.8 mmol/L (ref 3.5–5.1)
Sodium: 134 mmol/L — ABNORMAL LOW (ref 135–145)
Total Bilirubin: 0.5 mg/dL (ref 0.3–1.2)
Total Protein: 6.4 g/dL — ABNORMAL LOW (ref 6.5–8.1)

## 2014-11-15 LAB — PROTEIN / CREATININE RATIO, URINE
Creatinine, Urine: 43 mg/dL
Protein Creatinine Ratio: 0.19 mg/mg{Cre} — ABNORMAL HIGH (ref 0.00–0.15)
Total Protein, Urine: 8 mg/dL

## 2014-11-15 LAB — MRSA PCR SCREENING: MRSA by PCR: INVALID — AB

## 2014-11-15 MED ORDER — HYDRALAZINE HCL 20 MG/ML IJ SOLN: 5.0000 mg | INTRAMUSCULAR | Status: DC | PRN

## 2014-11-15 MED ORDER — OXYCODONE-ACETAMINOPHEN 5-325 MG PO TABS
2.0000 | ORAL_TABLET | ORAL | Status: DC | PRN
  Administered 2014-11-15 – 2014-11-16 (×2): 2 via ORAL
  Filled 2014-11-15 (×2): qty 2

## 2014-11-15 MED ORDER — SODIUM CHLORIDE 0.9 % IV SOLN: 250.0000 mL | INTRAVENOUS | Status: DC | PRN

## 2014-11-15 MED ORDER — IBUPROFEN 600 MG PO TABS
600.0000 mg | ORAL_TABLET | Freq: Four times a day (QID) | ORAL | Status: DC
  Administered 2014-11-15 – 2014-11-17 (×8): 600 mg via ORAL
  Filled 2014-11-15 (×8): qty 1

## 2014-11-15 MED ORDER — ZOLPIDEM TARTRATE 5 MG PO TABS
5.0000 mg | ORAL_TABLET | Freq: Every evening | ORAL | Status: DC | PRN
Start: 2014-11-15 — End: 2014-11-17

## 2014-11-15 MED ORDER — BISACODYL 10 MG RE SUPP: 10.0000 mg | Freq: Every day | RECTAL | Status: DC | PRN

## 2014-11-15 MED ORDER — OXYTOCIN 40 UNITS IN LACTATED RINGERS INFUSION - SIMPLE MED: 62.5000 mL/h | INTRAVENOUS | Status: DC | PRN

## 2014-11-15 MED ORDER — CETIRIZINE HCL 10 MG PO TABS: 10.0000 mg | ORAL_TABLET | Freq: Every day | ORAL | Status: DC

## 2014-11-15 MED ORDER — ONDANSETRON HCL 4 MG PO TABS: 4.0000 mg | ORAL_TABLET | ORAL | Status: DC | PRN

## 2014-11-15 MED ORDER — FLEET ENEMA 7-19 GM/118ML RE ENEM: 1.0000 | ENEMA | Freq: Every day | RECTAL | Status: DC | PRN

## 2014-11-15 MED ORDER — PRENATAL MULTIVITAMIN CH
1.0000 | ORAL_TABLET | Freq: Every day | ORAL | Status: DC
  Administered 2014-11-15 – 2014-11-16 (×2): 1 via ORAL
  Filled 2014-11-15 (×2): qty 1

## 2014-11-15 MED ORDER — LABETALOL HCL 5 MG/ML IV SOLN
20.0000 mg | INTRAVENOUS | Status: DC | PRN
  Administered 2014-11-15: 20 mg via INTRAVENOUS
  Filled 2014-11-15: qty 4

## 2014-11-15 MED ORDER — POLYETHYLENE GLYCOL 3350 17 G PO PACK
17.0000 g | PACK | Freq: Two times a day (BID) | ORAL | Status: DC
  Administered 2014-11-15 – 2014-11-17 (×5): 17 g via ORAL
  Filled 2014-11-15 (×5): qty 1

## 2014-11-15 MED ORDER — DIBUCAINE 1 % RE OINT: 1.0000 "application " | TOPICAL_OINTMENT | RECTAL | Status: DC | PRN

## 2014-11-15 MED ORDER — SODIUM CHLORIDE 0.9 % IJ SOLN: 3.0000 mL | INTRAMUSCULAR | Status: DC | PRN

## 2014-11-15 MED ORDER — MAGNESIUM SULFATE BOLUS VIA INFUSION
4.0000 g | Freq: Once | INTRAVENOUS | Status: AC
  Administered 2014-11-15: 4 g via INTRAVENOUS
  Filled 2014-11-15: qty 500

## 2014-11-15 MED ORDER — LANOLIN HYDROUS EX OINT: TOPICAL_OINTMENT | CUTANEOUS | Status: DC | PRN

## 2014-11-15 MED ORDER — MAGNESIUM SULFATE 50 % IJ SOLN
2.0000 g/h | INTRAVENOUS | Status: DC
  Administered 2014-11-15: 2 g/h via INTRAVENOUS
  Filled 2014-11-15 (×2): qty 80

## 2014-11-15 MED ORDER — MAGNESIUM SULFATE 50 % IJ SOLN
2.0000 g/h | INTRAVENOUS | Status: DC
  Administered 2014-11-15: 2 g/h via INTRAVENOUS
  Filled 2014-11-15: qty 80

## 2014-11-15 MED ORDER — ONDANSETRON HCL 4 MG/2ML IJ SOLN: 4.0000 mg | INTRAMUSCULAR | Status: DC | PRN

## 2014-11-15 MED ORDER — ACETAMINOPHEN 325 MG PO TABS: 650.0000 mg | ORAL_TABLET | ORAL | Status: DC | PRN

## 2014-11-15 MED ORDER — SIMETHICONE 80 MG PO CHEW
80.0000 mg | CHEWABLE_TABLET | ORAL | Status: DC | PRN
  Administered 2014-11-16: 80 mg via ORAL
  Filled 2014-11-15: qty 1

## 2014-11-15 MED ORDER — WITCH HAZEL-GLYCERIN EX PADS: 1.0000 "application " | MEDICATED_PAD | CUTANEOUS | Status: DC | PRN

## 2014-11-15 MED ORDER — LIDOCAINE HCL (PF) 1 % IJ SOLN
INTRAMUSCULAR | Status: DC | PRN
Start: 2014-11-15 — End: 2014-11-15
  Administered 2014-11-15: 4 mL
  Administered 2014-11-15: 5 mL

## 2014-11-15 MED ORDER — OXYCODONE-ACETAMINOPHEN 5-325 MG PO TABS
1.0000 | ORAL_TABLET | ORAL | Status: DC | PRN
  Administered 2014-11-15 – 2014-11-16 (×2): 1 via ORAL
  Filled 2014-11-15 (×2): qty 1

## 2014-11-15 MED ORDER — LACTATED RINGERS IV SOLN
INTRAVENOUS | Status: DC
  Administered 2014-11-15 (×2): via INTRAVENOUS

## 2014-11-15 MED ORDER — MONTELUKAST SODIUM 10 MG PO TABS: 10.0000 mg | ORAL_TABLET | Freq: Every day | ORAL | Status: DC

## 2014-11-15 MED ORDER — SENNOSIDES-DOCUSATE SODIUM 8.6-50 MG PO TABS
2.0000 | ORAL_TABLET | ORAL | Status: DC
  Administered 2014-11-16 (×2): 2 via ORAL
  Filled 2014-11-15 (×2): qty 2

## 2014-11-15 MED ORDER — BENZOCAINE-MENTHOL 20-0.5 % EX AERO
1.0000 "application " | INHALATION_SPRAY | CUTANEOUS | Status: DC | PRN
  Filled 2014-11-15: qty 56

## 2014-11-15 MED ORDER — SODIUM CHLORIDE 0.9 % IJ SOLN
3.0000 mL | Freq: Two times a day (BID) | INTRAMUSCULAR | Status: DC
  Administered 2014-11-16 (×2): 3 mL via INTRAVENOUS

## 2014-11-15 MED ORDER — DIPHENHYDRAMINE HCL 25 MG PO CAPS: 25.0000 mg | ORAL_CAPSULE | Freq: Four times a day (QID) | ORAL | Status: DC | PRN

## 2014-11-15 NOTE — Progress Notes (Signed)
CBC drawn, CHG bath and MRSA swab done.

## 2014-11-15 NOTE — Progress Notes (Signed)
Research patient for admission on A ICU R# [id number].

## 2014-11-15 NOTE — Anesthesia Preprocedure Evaluation (Addendum)
Anesthesia Evaluation  Patient identified by MRN, date of birth, ID band Patient awake    Reviewed: Allergy & Precautions, NPO status , Patient's Chart, lab work & pertinent test results  History of Anesthesia Complications Negative for: history of anesthetic complications  Airway Mallampati: II  TM Distance: >3 FB Neck ROM: Full    Dental no notable dental hx. (+) Dental Advisory Given   Pulmonary asthma , Current Smoker,  breath sounds clear to auscultation  Pulmonary exam normal       Cardiovascular hypertension (concern for Pre E), Pt. on medications Normal cardiovascular examRhythm:Regular Rate:Normal     Neuro/Psych negative neurological ROS  negative psych ROS   GI/Hepatic negative GI ROS, Neg liver ROS,   Endo/Other  negative endocrine ROS  Renal/GU negative Renal ROS  negative genitourinary   Musculoskeletal negative musculoskeletal ROS (+)   Abdominal   Peds negative pediatric ROS (+)  Hematology negative hematology ROS (+)   Anesthesia Other Findings   Reproductive/Obstetrics (+) Pregnancy                            Anesthesia Physical Anesthesia Plan  ASA: III  Anesthesia Plan: Epidural   Post-op Pain Management:    Induction:   Airway Management Planned:   Additional Equipment:   Intra-op Plan:   Post-operative Plan:   Informed Consent: I have reviewed the patients History and Physical, chart, labs and discussed the procedure including the risks, benefits and alternatives for the proposed anesthesia with the patient or authorized representative who has indicated his/her understanding and acceptance.   Dental advisory given  Plan Discussed with: CRNA  Anesthesia Plan Comments:        Anesthesia Quick Evaluation
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
## 2014-11-15 NOTE — Addendum Note (Signed)
Addendum  created 11/15/14 1105 by Asher Muir, CRNA   Modules edited: [place] Based Order Sets

## 2014-11-15 NOTE — Progress Notes (Signed)
Pt was sitting up in bed w/lunch when I arrived. She delivered this morning and baby is being adopted by parents whom I was told are on premises. Pt is very much at peace and well-adjusted to the adoption. She said she has prayed about it and is not is not ready for a second child. Her first daughter is 83 mos.  I told Chaplains are available if she needs our services. She was very thankful for visit and offer. She spoke highly of her family who seems to be supportive. Her atty was also present during portions of our visit. Pt expects to be discharged Monday. Ernest Haber Chaplain

## 2014-11-15 NOTE — Anesthesia Postprocedure Evaluation (Signed)
Anesthesia Post Note  Patient: [facility]  Procedure(s) Performed: * No procedures listed *  Anesthesia type: Epidural  Patient location: Mother/Baby  Post pain: Pain level controlled  Post assessment: Post-op Vital signs reviewed  Last Vitals:  Filed Vitals:   11/15/14 0800  BP: 134/82  Pulse: 85  Temp:   Resp: 16    Post vital signs: Reviewed  Level of consciousness:alert  Complications: No apparent anesthesia complications

## 2014-11-15 NOTE — Anesthesia Procedure Notes (Signed)
Epidural Patient location during procedure: OB  Staffing Anesthesiologist: Eiden Bagot Performed by: anesthesiologist   Preanesthetic Checklist Completed: patient identified, site marked, surgical consent, pre-op evaluation, timeout performed, IV checked, risks and benefits discussed and monitors and equipment checked  Epidural Patient position: sitting Prep: site prepped and draped and DuraPrep Patient monitoring: continuous pulse ox and blood pressure Approach: midline Location: L3-L4 Injection technique: LOR saline  Needle:  Needle type: Tuohy  Needle gauge: 17 G Needle length: 9 cm and 9 Needle insertion depth: 8 cm Catheter type: closed end flexible Catheter size: 19 Gauge Catheter at skin depth: 15 cm Test dose: negative  Assessment Events: blood not aspirated, injection not painful, no injection resistance, negative IV test and no paresthesia  Additional Notes Patient identified. Risks/Benefits/Options discussed with patient including but not limited to bleeding, infection, nerve damage, paralysis, failed block, incomplete pain control, headache, blood pressure changes, nausea, vomiting, reactions to medication both or allergic, itching and postpartum back pain. Confirmed with bedside nurse the patient's most recent platelet count. Confirmed with patient that they are not currently taking any anticoagulation, have any bleeding history or any family history of bleeding disorders. Patient expressed understanding and wished to proceed. All questions were answered. Sterile technique was used throughout the entire procedure. Please see nursing notes for vital signs. Test dose was given through epidural catheter and negative prior to continuing to dose epidural or start infusion. Warning signs of high block given to the patient including shortness of breath, tingling/numbness in hands, complete motor block, or any concerning symptoms with instructions to call for help. Patient was  given instructions on fall risk and not to get out of bed. All questions and concerns addressed with instructions to call with any issues or inadequate analgesia.

## 2014-11-15 NOTE — Progress Notes (Signed)
other met with Diamantina [place] and signed adoption paperwork.  Copies place in newborn's chart.

## 2014-11-15 NOTE — Progress Notes (Signed)
Labor Progress Note  S: notified of BP 177/93.  Denies HA/visual changes/RUQ pain  O:  BP 177/93 mmHg  Pulse 70  Temp(Src) 97.7 F (36.5 C) (Oral)  Resp 18  Ht 5\' 8"  (1.727 m)  Wt 227 lb (102.967 kg)  BMI 34.52 kg/m2  SpO2 99%  LMP 03/03/2014 Cat cat I CVE: not examined this interview DTR 3+, no clonus, face notably swollen   A&P: 29 y.o. G2P1001 [redacted]w[redacted]d IOL 2/2 cHTN # labor: s/p cytotec and FB, FB out at 2300, currently on pitocin # BP: prn antihypertensives, magnesium given hyperreflexia and severe range BP, preE labs  Merla Riches, MD 12:35 AM

## 2014-11-15 NOTE — Telephone Encounter (Signed)
Pt was discharged and wanted these called in for her

## 2014-11-16 MED ORDER — AMLODIPINE BESYLATE 10 MG PO TABS
10.0000 mg | ORAL_TABLET | Freq: Every day | ORAL | Status: DC
  Administered 2014-11-16 – 2014-11-17 (×2): 10 mg via ORAL
  Filled 2014-11-16 (×2): qty 1

## 2014-11-16 NOTE — Progress Notes (Signed)
   11/16/14 0909  Vitals  BP (!) 154/105 mmHg  MAP (mmHg) 115  BP Location Right Arm  BP Method Automatic  Patient Position (if appropriate) Lying  Pulse Rate 80  Oxygen Therapy  SpO2 100 %  O2 Device Room Air   Dr. Glo Herring notified.  On way to evaluate patient.

## 2014-11-16 NOTE — Progress Notes (Signed)
Post Partum Day 1 Subjective: no complaints, up ad lib, tolerating PO and denies headache or vision complaints. [facility]FA, arranged already and baby is staying with the adoptive parents.  Objective: Blood pressure 140/98, pulse 86, temperature 97.8 F (36.6 C), temperature source Oral, resp. rate 18, height 5\' 8"  (1.727 m), weight 225 lb 14.4 oz (102.468 kg), last menstrual period 03/03/2014, SpO2 100 %, unknown if currently breastfeeding.  Physical Exam:  General: alert, cooperative and no distress Lochia: appropriate Uterine Fundus: firm Incision:  DVT Evaluation: No evidence of DVT seen on physical exam. reflexes 2+  Recent Labs  11/14/14 2345 11/15/14 0658  HGB 11.7* 11.5*  HCT 33.0* 32.8*    Assessment/Plan: Plan for discharge tomorrow Will transfer to 3rd floor, and begin Norvasc 10 daily, as pt has had moderately increased BP's since d/c of mag sulfate.  LOS: 4 days   [name] [name] V 11/16/2014, 11:05 AM

## 2014-11-17 LAB — MRSA CULTURE

## 2014-11-17 MED ORDER — AMLODIPINE BESYLATE 10 MG PO TABS: 10.0000 mg | ORAL_TABLET | Freq: Every day | ORAL | Status: DC

## 2014-11-17 MED ORDER — AMLODIPINE BESYLATE 10 MG PO TABS
10.0000 mg | ORAL_TABLET | Freq: Every day | ORAL | Status: DC
Start: 2014-11-17 — End: 2014-11-17
  Filled 2014-11-17: qty 1

## 2014-11-17 MED ORDER — IBUPROFEN 600 MG PO TABS: 600.0000 mg | ORAL_TABLET | Freq: Four times a day (QID) | ORAL | Status: DC

## 2014-11-17 MED ORDER — ACETAMINOPHEN-CODEINE #3 300-30 MG PO TABS: 2.0000 | ORAL_TABLET | ORAL | Status: DC | PRN

## 2014-11-17 NOTE — Progress Notes (Signed)
Post Partum Day 2 Subjective: no complaints, up ad lib, voiding and tolerating PO  Objective: Blood pressure 129/77, pulse 68, temperature 98.3 F (36.8 C), temperature source Oral, resp. rate 15, height 5\' 8"  (1.727 m), weight 224 lb 12 oz (101.946 kg), last menstrual period 03/03/2014, SpO2 99 %, unknown if currently breastfeeding.  Physical Exam:  General: alert, cooperative and no distress Lochia: appropriate Uterine Fundus: firm Incision: healing well DVT Evaluation: No evidence of DVT seen on physical exam.   Recent Labs  11/14/14 2345 11/15/14 0658  HGB 11.7* 11.5*  HCT 33.0* 32.8*    Assessment/Plan: Discharge home and Contraception Depo Provera   LOS: 5 days   [name] [name] V 11/17/2014, 7:09 AM

## 2014-11-17 NOTE — Progress Notes (Signed)
Teaching complete  Ready for ride home

## 2014-11-17 NOTE — Discharge Instructions (Signed)

## 2014-11-17 NOTE — Discharge Summary (Signed)
Obstetric Discharge Summary Reason for Admission: induction of labor  Chronic Hypertension in pregnancy Prenatal Procedures: ultrasound Intrapartum Procedures: spontaneous vaginal delivery and Magnesium sulfate for seizure prophylaxis Postpartum Procedures: magnesium sulfate x 24 hrs pp. Complications-Operative and Postpartum: none HEMOGLOBIN  Date Value Ref Range Status  11/15/2014 11.5* 12.0 - 15.0 g/dL Final   HCT  Date Value Ref Range Status  11/15/2014 32.8* 36.0 - 46.0 % Final    Physical Exam:  General: alert, cooperative and no distress Lochia: appropriate Uterine Fundus: firm Incision:  DVT Evaluation: No evidence of DVT seen on physical exam.  Discharge Diagnoses: Term Pregnancy-delivered, Preelampsia and chronic hypertension in pregnancy  Discharge Information: has given baby up for adoption, wants to start depoProvera at pp visit, is considering LARC, but skeptical re: Nexplanon, as she's "a Picker" that might fixate on the device beneath the skin. Has Appt for 30 June. Date: 11/17/2014 Activity: pelvic rest Diet: routine Medications: PNV, Tylenol #3, Ibuprofen and Norvasc 10 mg daily Condition: stable Instructions: refer to practice specific booklet Discharge to: home Follow-up Information    Follow up with [facility] In 4 weeks.   Specialty:  Obstetrics and Gynecology   Why:  postpartum visit, depo Provera   Contact information:   [address] [id number] [place] [phone]      Newborn Data: Live born female  Birth Weight: 5 lb 15.1 oz (2696 g) APGAR: 9, 9  Home with Adoption thru [facility].Jonnie Kind 11/17/2014, 7:13 AM

## 2014-12-02 ENCOUNTER — Encounter: Payer: Self-pay | Admitting: Obstetrics & Gynecology

## 2014-12-17 ENCOUNTER — Other Ambulatory Visit (HOSPITAL_COMMUNITY)
Admission: RE | Admit: 2014-12-17 | Discharge: 2014-12-17 | Disposition: A | Discharge status: Home / Self Care | Payer: Medicaid Other | Source: Ambulatory Visit | Attending: Family Medicine | Admitting: Family Medicine

## 2014-12-17 ENCOUNTER — Ambulatory Visit (INDEPENDENT_AMBULATORY_CARE_PROVIDER_SITE_OTHER): Payer: Medicaid Other | Admitting: Family Medicine

## 2014-12-17 ENCOUNTER — Encounter: Payer: Self-pay | Admitting: Family Medicine

## 2014-12-17 VITALS — BP 138/92 | HR 78 | Temp 97.9°F | Wt 220.0 lb

## 2014-12-17 DIAGNOSIS — Z124 Encounter for screening for malignant neoplasm of cervix: Secondary | ICD-10-CM | POA: Diagnosis not present

## 2014-12-17 DIAGNOSIS — Z3202 Encounter for pregnancy test, result negative: Secondary | ICD-10-CM

## 2014-12-17 DIAGNOSIS — Z01419 Encounter for gynecological examination (general) (routine) without abnormal findings: Secondary | ICD-10-CM | POA: Diagnosis not present

## 2014-12-17 DIAGNOSIS — Z30430 Encounter for insertion of intrauterine contraceptive device: Secondary | ICD-10-CM | POA: Diagnosis not present

## 2014-12-17 LAB — POCT PREGNANCY, URINE: PREG TEST UR: NEGATIVE

## 2014-12-17 MED ORDER — LEVONORGESTREL 18.6 MCG/DAY IU IUD
INTRAUTERINE_SYSTEM | Freq: Once | INTRAUTERINE | Status: AC
  Administered 2014-12-17: 1 via INTRAUTERINE

## 2014-12-17 MED ORDER — LEVONORGESTREL 20 MCG/24HR IU IUD: 1.0000 | INTRAUTERINE_SYSTEM | Freq: Once | INTRAUTERINE | Status: DC

## 2014-12-17 NOTE — Progress Notes (Signed)
Subjective:     [name] [name] is a 29 y.o. female who presents for a postpartum visit. She is 4 weeks postpartum following a spontaneous vaginal delivery. I have fully reviewed the prenatal and intrapartum course. The delivery was at 40 3/7 gestational weeks. Outcome: spontaneous vaginal delivery. Anesthesia: epidural. Postpartum course has been unremarkable.  Baby is feeding by bottle - adopted . Bleeding no bleeding. Bowel function is normal. Bladder function is normal. Patient is not sexually active. Contraception method is Depo-Provera injections. Postpartum depression screening: negative.  The following portions of the patient's history were reviewed and updated as appropriate: allergies, current medications, past family history, past medical history, past social history, past surgical history and problem list.  Review of Systems A comprehensive review of systems was negative.   Objective:    BP 138/92 mmHg  Pulse 78  Temp(Src) 97.9 F (36.6 C) (Oral)  Wt 220 lb (99.791 kg)  General:  alert, cooperative and appears stated age  Abdomen: soft, non-tender; bowel sounds normal; no masses,  no organomegaly   Vulva:  normal  Vagina: normal vagina  Cervix:  no bleeding following Pap and no cervical motion tenderness  Corpus: normal size, contour, position, consistency, mobility, non-tender  Adnexa:  normal adnexa       Patient identified, informed consent performed, signed copy in chart, time out was performed.  Urine pregnancy test negative.  Speculum placed in the vagina.  Cervix visualized.  Cleaned with Betadine x 2.  Grasped anteriourly with a single tooth tenaculum.  Uterus sounded to 7 cm.  Liletta IUD placed per manufacturer's recommendations.  Strings trimmed to 3 cm.   Patient given post procedure instructions and Liletta care card with expiration date.  Patient is asked to check IUD strings periodically and follow up in 4-6 weeks for IUD check.  Assessment:    Normal  postpartum exam. Pap smear done at today's visit.   Plan:    1. Contraception: IUD 2. Pap due 2019 3. Follow up in: 1 year or as needed.

## 2014-12-17 NOTE — Patient Instructions (Addendum)
Levonorgestrel intrauterine device (IUD) What is this medicine? LEVONORGESTREL IUD (LEE voe nor jes trel) is a contraceptive (birth control) device. The device is placed inside the uterus by a healthcare professional. It is used to prevent pregnancy and can also be used to treat heavy bleeding that occurs during your period. Depending on the device, it can be used for 3 to 5 years. This medicine may be used for other purposes; ask your health care provider or pharmacist if you have questions. COMMON BRAND NAME(S): LILETTA, Mirena, Skyla What should I tell my health care provider before I take this medicine? They need to know if you have any of these conditions: -abnormal Pap smear -cancer of the breast, uterus, or cervix -diabetes -endometritis -genital or pelvic infection now or in the past -have more than one sexual partner or your partner has more than one partner -heart disease -history of an ectopic or tubal pregnancy -immune system problems -IUD in place -liver disease or tumor -problems with blood clots or take blood-thinners -use intravenous drugs -uterus of unusual shape -vaginal bleeding that has not been explained -an unusual or allergic reaction to levonorgestrel, other hormones, silicone, or polyethylene, medicines, foods, dyes, or preservatives -pregnant or trying to get pregnant -breast-feeding How should I use this medicine? This device is placed inside the uterus by a health care professional. Talk to your pediatrician regarding the use of this medicine in children. Special care may be needed. Overdosage: If you think you have taken too much of this medicine contact a poison control center or emergency room at once. NOTE: This medicine is only for you. Do not share this medicine with others. What if I miss a dose? This does not apply. What may interact with this medicine? Do not take this medicine with any of the following  medications: -amprenavir -bosentan -fosamprenavir This medicine may also interact with the following medications: -aprepitant -barbiturate medicines for inducing sleep or treating seizures -bexarotene -griseofulvin -medicines to treat seizures like carbamazepine, ethotoin, felbamate, oxcarbazepine, phenytoin, topiramate -modafinil -pioglitazone -rifabutin -rifampin -rifapentine -some medicines to treat HIV infection like atazanavir, indinavir, lopinavir, nelfinavir, tipranavir, ritonavir -St. John's wort -warfarin This list may not describe all possible interactions. Give your health care provider a list of all the medicines, herbs, non-prescription drugs, or dietary supplements you use. Also tell them if you smoke, drink alcohol, or use illegal drugs. Some items may interact with your medicine. What should I watch for while using this medicine? Visit your doctor or health care professional for regular check ups. See your doctor if you or your partner has sexual contact with others, becomes HIV positive, or gets a sexual transmitted disease. This product does not protect you against HIV infection (AIDS) or other sexually transmitted diseases. You can check the placement of the IUD yourself by reaching up to the top of your vagina with clean fingers to feel the threads. Do not pull on the threads. It is a good habit to check placement after each menstrual period. Call your doctor right away if you feel more of the IUD than just the threads or if you cannot feel the threads at all. The IUD may come out by itself. You may become pregnant if the device comes out. If you notice that the IUD has come out use a backup birth control method like condoms and call your health care provider. Using tampons will not change the position of the IUD and are okay to use during your period. What side effects may   I notice from receiving this medicine? Side effects that you should report to your doctor or  health care professional as soon as possible: -allergic reactions like skin rash, itching or hives, swelling of the face, lips, or tongue -fever, flu-like symptoms -genital sores -high blood pressure -no menstrual period for 6 weeks during use -pain, swelling, warmth in the leg -pelvic pain or tenderness -severe or sudden headache -signs of pregnancy -stomach cramping -sudden shortness of breath -trouble with balance, talking, or walking -unusual vaginal bleeding, discharge -yellowing of the eyes or skin Side effects that usually do not require medical attention (report to your doctor or health care professional if they continue or are bothersome): -acne -breast pain -change in sex drive or performance -changes in weight -cramping, dizziness, or faintness while the device is being inserted -headache -irregular menstrual bleeding within first 3 to 6 months of use -nausea This list may not describe all possible side effects. Call your doctor for medical advice about side effects. You may report side effects to FDA at 1-800-FDA-1088. Where should I keep my medicine? This does not apply. NOTE: This sheet is a summary. It may not cover all possible information. If you have questions about this medicine, talk to your doctor, pharmacist, or health care provider.  2015, [employer] Standard. (2011-07-06 13:54:04) Hypertension Hypertension, commonly called high blood pressure, is when the force of blood pumping through your arteries is too strong. Your arteries are the blood vessels that carry blood from your heart throughout your body. A blood pressure reading consists of a higher number over a lower number, such as 110/72. The higher number (systolic) is the pressure inside your arteries when your heart pumps. The lower number (diastolic) is the pressure inside your arteries when your heart relaxes. Ideally you want your blood pressure below 120/80. Hypertension forces your heart to work  harder to pump blood. Your arteries may become narrow or stiff. Having hypertension puts you at risk for heart disease, stroke, and other problems.  RISK FACTORS Some risk factors for high blood pressure are controllable. Others are not.  Risk factors you cannot control include:   Race. You may be at higher risk if you are African American.  Age. Risk increases with age.  Gender. Men are at higher risk than women before age 40 years. After age 52, women are at higher risk than men. Risk factors you can control include:  Not getting enough exercise or physical activity.  Being overweight.  Getting too much fat, sugar, calories, or salt in your diet.  Drinking too much alcohol. SIGNS AND SYMPTOMS Hypertension does not usually cause signs or symptoms. Extremely high blood pressure (hypertensive crisis) may cause headache, anxiety, shortness of breath, and nosebleed. DIAGNOSIS  To check if you have hypertension, your health care provider will measure your blood pressure while you are seated, with your arm held at the level of your heart. It should be measured at least twice using the same arm. Certain conditions can cause a difference in blood pressure between your right and left arms. A blood pressure reading that is higher than normal on one occasion does not mean that you need treatment. If one blood pressure reading is high, ask your health care provider about having it checked again. TREATMENT  Treating high blood pressure includes making lifestyle changes and possibly taking medicine. Living a healthy lifestyle can help lower high blood pressure. You may need to change some of your habits. Lifestyle changes may include:  Following the  DASH diet. This diet is high in fruits, vegetables, and whole grains. It is low in salt, red meat, and added sugars.  Getting at least 2 hours of brisk physical activity every week.  Losing weight if necessary.  Not smoking.  Limiting alcoholic  beverages.  Learning ways to reduce stress. If lifestyle changes are not enough to get your blood pressure under control, your health care provider may prescribe medicine. You may need to take more than one. Work closely with your health care provider to understand the risks and benefits. HOME CARE INSTRUCTIONS  Have your blood pressure rechecked as directed by your health care provider.   Take medicines only as directed by your health care provider. Follow the directions carefully. Blood pressure medicines must be taken as prescribed. The medicine does not work as well when you skip doses. Skipping doses also puts you at risk for problems.   Do not smoke.   Monitor your blood pressure at home as directed by your health care provider. SEEK MEDICAL CARE IF:   You think you are having a reaction to medicines taken.  You have recurrent headaches or feel dizzy.  You have swelling in your ankles.  You have trouble with your vision. SEEK IMMEDIATE MEDICAL CARE IF:  You develop a severe headache or confusion.  You have unusual weakness, numbness, or feel faint.  You have severe chest or abdominal pain.  You vomit repeatedly.  You have trouble breathing. MAKE SURE YOU:   Understand these instructions.  Will watch your condition.  Will get help right away if you are not doing well or get worse. Document Released: 06/05/2005 Document Revised: 10/20/2013 Document Reviewed: 03/28/2013 [facility] Patient Information 2015 [place], [place]. This information is not intended to replace advice given to you by your health care provider. Make sure you discuss any questions you have with your health care provider. Low Back Sprain with Rehab  A sprain is an injury in which a ligament is torn. The ligaments of the lower back are vulnerable to sprains. However, they are strong and require great force to be injured. These ligaments are important for stabilizing the spinal column. Sprains are  classified into three categories. Grade 1 sprains cause pain, but the tendon is not lengthened. Grade 2 sprains include a lengthened ligament, due to the ligament being stretched or partially ruptured. With grade 2 sprains there is still function, although the function may be decreased. Grade 3 sprains involve a complete tear of the tendon or muscle, and function is usually impaired. SYMPTOMS   Severe pain in the lower back.  Sometimes, a feeling of a "pop," "snap," or tear, at the time of injury.  Tenderness and sometimes swelling at the injury site.  Uncommonly, bruising (contusion) within 48 hours of injury.  Muscle spasms in the back. CAUSES  Low back sprains occur when a force is placed on the ligaments that is greater than they can handle. Common causes of injury include:  Performing a stressful act while off-balance.  Repetitive stressful activities that involve movement of the lower back.  Direct hit (trauma) to the lower back. RISK INCREASES WITH:  Contact sports (football, wrestling).  Collisions (major skiing accidents).  Sports that require throwing or lifting (baseball, weightlifting).  Sports involving twisting of the spine (gymnastics, diving, tennis, golf).  Poor strength and flexibility.  Inadequate protection.  Previous back injury or surgery (especially fusion). PREVENTION  Wear properly fitted and padded protective equipment.  Warm up and stretch properly before  activity.  Allow for adequate recovery between workouts.  Maintain physical fitness:  Strength, flexibility, and endurance.  Cardiovascular fitness.  Maintain a healthy body weight. PROGNOSIS  If treated properly, low back sprains usually heal with non-surgical treatment. The length of time for healing depends on the severity of the injury.  RELATED COMPLICATIONS   Recurring symptoms, resulting in a chronic problem.  Chronic inflammation and pain in the low back.  Delayed healing  or resolution of symptoms, especially if activity is resumed too soon.  Prolonged impairment.  Unstable or arthritic joints of the low back. TREATMENT  Treatment first involves the use of ice and medicine, to reduce pain and inflammation. The use of strengthening and stretching exercises may help reduce pain with activity. These exercises may be performed at home or with a therapist. Severe injuries may require referral to a therapist for further evaluation and treatment, such as ultrasound. Your caregiver may advise that you wear a back brace or corset, to help reduce pain and discomfort. Often, prolonged bed rest results in greater harm then benefit. Corticosteroid injections may be recommended. However, these should be reserved for the most serious cases. It is important to avoid using your back when lifting objects. At night, sleep on your back on a firm mattress, with a pillow placed under your knees. If non-surgical treatment is unsuccessful, surgery may be needed.  MEDICATION   If pain medicine is needed, nonsteroidal anti-inflammatory medicines (aspirin and ibuprofen), or other minor pain relievers (acetaminophen), are often advised.  Do not take pain medicine for 7 days before surgery.  Prescription pain relievers may be given, if your caregiver thinks they are needed. Use only as directed and only as much as you need.  Ointments applied to the skin may be helpful.  Corticosteroid injections may be given by your caregiver. These injections should be reserved for the most serious cases, because they may only be given a certain number of times. HEAT AND COLD  Cold treatment (icing) should be applied for 10 to 15 minutes every 2 to 3 hours for inflammation and pain, and immediately after activity that aggravates your symptoms. Use ice packs or an ice massage.  Heat treatment may be used before performing stretching and strengthening activities prescribed by your caregiver, physical  therapist, or athletic trainer. Use a heat pack or a warm water soak. SEEK MEDICAL CARE IF:   Symptoms get worse or do not improve in 2 to 4 weeks, despite treatment.  You develop numbness or weakness in either leg.  You lose bowel or bladder function.  Any of the following occur after surgery: fever, increased pain, swelling, redness, drainage of fluids, or bleeding in the affected area.  New, unexplained symptoms develop. (Drugs used in treatment may produce side effects.) EXERCISES  RANGE OF MOTION (ROM) AND STRETCHING EXERCISES - Low Back Sprain Most people with lower back pain will find that their symptoms get worse with excessive bending forward (flexion) or arching at the lower back (extension). The exercises that will help resolve your symptoms will focus on the opposite motion.  Your physician, physical therapist or athletic trainer will help you determine which exercises will be most helpful to resolve your lower back pain. Do not complete any exercises without first consulting with your caregiver. Discontinue any exercises which make your symptoms worse, until you speak to your caregiver. If you have pain, numbness or tingling which travels down into your buttocks, leg or foot, the goal of the therapy is for  these symptoms to move closer to your back and eventually resolve. Sometimes, these leg symptoms will get better, but your lower back pain may worsen. This is often an indication of progress in your rehabilitation. Be very alert to any changes in your symptoms and the activities in which you participated in the 24 hours prior to the change. Sharing this information with your caregiver will allow him or her to most efficiently treat your condition. These exercises may help you when beginning to rehabilitate your injury. Your symptoms may resolve with or without further involvement from your physician, physical therapist or athletic trainer. While completing these exercises, remember:    Restoring tissue flexibility helps normal motion to return to the joints. This allows healthier, less painful movement and activity.  An effective stretch should be held for at least 30 seconds.  A stretch should never be painful. You should only feel a gentle lengthening or release in the stretched tissue. FLEXION RANGE OF MOTION AND STRETCHING EXERCISES: STRETCH - Flexion, Single Knee to Chest   Lie on a firm bed or floor with both legs extended in front of you.  Keeping one leg in contact with the floor, bring your opposite knee to your chest. Hold your leg in place by either grabbing behind your thigh or at your knee.  Pull until you feel a gentle stretch in your low back. Hold __________ seconds.  Slowly release your grasp and repeat the exercise with the opposite side. Repeat __________ times. Complete this exercise __________ times per day.  STRETCH - Flexion, Double Knee to Chest  Lie on a firm bed or floor with both legs extended in front of you.  Keeping one leg in contact with the floor, bring your opposite knee to your chest.  Tense your stomach muscles to support your back and then lift your other knee to your chest. Hold your legs in place by either grabbing behind your thighs or at your knees.  Pull both knees toward your chest until you feel a gentle stretch in your low back. Hold __________ seconds.  Tense your stomach muscles and slowly return one leg at a time to the floor. Repeat __________ times. Complete this exercise __________ times per day.  STRETCH - Low Trunk Rotation  Lie on a firm bed or floor. Keeping your legs in front of you, bend your knees so they are both pointed toward the ceiling and your feet are flat on the floor.  Extend your arms out to the side. This will stabilize your upper body by keeping your shoulders in contact with the floor.  Gently and slowly drop both knees together to one side until you feel a gentle stretch in your low back.  Hold for __________ seconds.  Tense your stomach muscles to support your lower back as you bring your knees back to the starting position. Repeat the exercise to the other side. Repeat __________ times. Complete this exercise __________ times per day  EXTENSION RANGE OF MOTION AND FLEXIBILITY EXERCISES: STRETCH - Extension, Prone on Elbows   Lie on your stomach on the floor, a bed will be too soft. Place your palms about shoulder width apart and at the height of your head.  Place your elbows under your shoulders. If this is too painful, stack pillows under your chest.  Allow your body to relax so that your hips drop lower and make contact more completely with the floor.  Hold this position for __________ seconds.  Slowly return to lying  flat on the floor. Repeat __________ times. Complete this exercise __________ times per day.  RANGE OF MOTION - Extension, Prone Press Ups  Lie on your stomach on the floor, a bed will be too soft. Place your palms about shoulder width apart and at the height of your head.  Keeping your back as relaxed as possible, slowly straighten your elbows while keeping your hips on the floor. You may adjust the placement of your hands to maximize your comfort. As you gain motion, your hands will come more underneath your shoulders.  Hold this position __________ seconds.  Slowly return to lying flat on the floor. Repeat __________ times. Complete this exercise __________ times per day.  RANGE OF MOTION- Quadruped, Neutral Spine   Assume a hands and knees position on a firm surface. Keep your hands under your shoulders and your knees under your hips. You may place padding under your knees for comfort.  Drop your head and point your tailbone toward the ground below you. This will round out your lower back like an angry cat. Hold this position for __________ seconds.  Slowly lift your head and release your tail bone so that your back sags into a large arch, like an  old horse.  Hold this position for __________ seconds.  Repeat this until you feel limber in your low back.  Now, find your "sweet spot." This will be the most comfortable position somewhere between the two previous positions. This is your neutral spine. Once you have found this position, tense your stomach muscles to support your low back.  Hold this position for __________ seconds. Repeat __________ times. Complete this exercise __________ times per day.  STRENGTHENING EXERCISES - Low Back Sprain These exercises may help you when beginning to rehabilitate your injury. These exercises should be done near your "sweet spot." This is the neutral, low-back arch, somewhere between fully rounded and fully arched, that is your least painful position. When performed in this safe range of motion, these exercises can be used for people who have either a flexion or extension based injury. These exercises may resolve your symptoms with or without further involvement from your physician, physical therapist or athletic trainer. While completing these exercises, remember:   Muscles can gain both the endurance and the strength needed for everyday activities through controlled exercises.  Complete these exercises as instructed by your physician, physical therapist or athletic trainer. Increase the resistance and repetitions only as guided.  You may experience muscle soreness or fatigue, but the pain or discomfort you are trying to eliminate should never worsen during these exercises. If this pain does worsen, stop and make certain you are following the directions exactly. If the pain is still present after adjustments, discontinue the exercise until you can discuss the trouble with your caregiver. STRENGTHENING - Deep Abdominals, Pelvic Tilt   Lie on a firm bed or floor. Keeping your legs in front of you, bend your knees so they are both pointed toward the ceiling and your feet are flat on the floor.  Tense  your lower abdominal muscles to press your low back into the floor. This motion will rotate your pelvis so that your tail bone is scooping upwards rather than pointing at your feet or into the floor. With a gentle tension and even breathing, hold this position for __________ seconds. Repeat __________ times. Complete this exercise __________ times per day.  STRENGTHENING - Abdominals, Crunches   Lie on a firm bed or floor. Keeping your legs  in front of you, bend your knees so they are both pointed toward the ceiling and your feet are flat on the floor. Cross your arms over your chest.  Slightly tip your chin down without bending your neck.  Tense your abdominals and slowly lift your trunk high enough to just clear your shoulder blades. Lifting higher can put excessive stress on the lower back and does not further strengthen your abdominal muscles.  Control your return to the starting position. Repeat __________ times. Complete this exercise __________ times per day.  STRENGTHENING - Quadruped, Opposite UE/LE Lift   Assume a hands and knees position on a firm surface. Keep your hands under your shoulders and your knees under your hips. You may place padding under your knees for comfort.  Find your neutral spine and gently tense your abdominal muscles so that you can maintain this position. Your shoulders and hips should form a rectangle that is parallel with the floor and is not twisted.  Keeping your trunk steady, lift your right hand no higher than your shoulder and then your left leg no higher than your hip. Make sure you are not holding your breath. Hold this position for __________ seconds.  Continuing to keep your abdominal muscles tense and your back steady, slowly return to your starting position. Repeat with the opposite arm and leg. Repeat __________ times. Complete this exercise __________ times per day.  STRENGTHENING - Abdominals and Quadriceps, Straight Leg Raise   Lie on a firm  bed or floor with both legs extended in front of you.  Keeping one leg in contact with the floor, bend the other knee so that your foot can rest flat on the floor.  Find your neutral spine, and tense your abdominal muscles to maintain your spinal position throughout the exercise.  Slowly lift your straight leg off the floor about 6 inches for a count of 15, making sure to not hold your breath.  Still keeping your neutral spine, slowly lower your leg all the way to the floor. Repeat this exercise with each leg __________ times. Complete this exercise __________ times per day. POSTURE AND BODY MECHANICS CONSIDERATIONS - Low Back Sprain Keeping correct posture when sitting, standing or completing your activities will reduce the stress put on different body tissues, allowing injured tissues a chance to heal and limiting painful experiences. The following are general guidelines for improved posture. Your physician or physical therapist will provide you with any instructions specific to your needs. While reading these guidelines, remember:  The exercises prescribed by your provider will help you have the flexibility and strength to maintain correct postures.  The correct posture provides the best environment for your joints to work. All of your joints have less wear and tear when properly supported by a spine with good posture. This means you will experience a healthier, less painful body.  Correct posture must be practiced with all of your activities, especially prolonged sitting and standing. Correct posture is as important when doing repetitive low-stress activities (typing) as it is when doing a single heavy-load activity (lifting). RESTING POSITIONS Consider which positions are most painful for you when choosing a resting position. If you have pain with flexion-based activities (sitting, bending, stooping, squatting), choose a position that allows you to rest in a less flexed posture. You would  want to avoid curling into a fetal position on your side. If your pain worsens with extension-based activities (prolonged standing, working overhead), avoid resting in an extended position such as  sleeping on your stomach. Most people will find more comfort when they rest with their spine in a more neutral position, neither too rounded nor too arched. Lying on a non-sagging bed on your side with a pillow between your knees, or on your back with a pillow under your knees will often provide some relief. Keep in mind, being in any one position for a prolonged period of time, no matter how correct your posture, can still lead to stiffness. PROPER SITTING POSTURE In order to minimize stress and discomfort on your spine, you must sit with correct posture. Sitting with good posture should be effortless for a healthy body. Returning to good posture is a gradual process. Many people can work toward this most comfortably by using various supports until they have the flexibility and strength to maintain this posture on their own. When sitting with proper posture, your ears will fall over your shoulders and your shoulders will fall over your hips. You should use the back of the chair to support your upper back. Your lower back will be in a neutral position, just slightly arched. You may place a small pillow or folded towel at the base of your lower back for  support.  When working at a desk, create an environment that supports good, upright posture. Without extra support, muscles tire, which leads to excessive strain on joints and other tissues. Keep these recommendations in mind: CHAIR:  A chair should be able to slide under your desk when your back makes contact with the back of the chair. This allows you to work closely.  The chair's height should allow your eyes to be level with the upper part of your monitor and your hands to be slightly lower than your elbows. BODY POSITION  Your feet should make contact with  the floor. If this is not possible, use a foot rest.  Keep your ears over your shoulders. This will reduce stress on your neck and low back. INCORRECT SITTING POSTURES  If you are feeling tired and unable to assume a healthy sitting posture, do not slouch or slump. This puts excessive strain on your back tissues, causing more damage and pain. Healthier options include:  Using more support, like a lumbar pillow.  Switching tasks to something that requires you to be upright or walking.  Talking a brief walk.  Lying down to rest in a neutral-spine position. PROLONGED STANDING WHILE SLIGHTLY LEANING FORWARD  When completing a task that requires you to lean forward while standing in one place for a long time, place either foot up on a stationary 2-4 inch high object to help maintain the best posture. When both feet are on the ground, the lower back tends to lose its slight inward curve. If this curve flattens (or becomes too large), then the back and your other joints will experience too much stress, tire more quickly, and can cause pain. CORRECT STANDING POSTURES Proper standing posture should be assumed with all daily activities, even if they only take a few moments, like when brushing your teeth. As in sitting, your ears should fall over your shoulders and your shoulders should fall over your hips. You should keep a slight tension in your abdominal muscles to brace your spine. Your tailbone should point down to the ground, not behind your body, resulting in an over-extended swayback posture.  INCORRECT STANDING POSTURES  Common incorrect standing postures include a forward head, locked knees and/or an excessive swayback. WALKING Walk with an upright posture. Your  ears, shoulders and hips should all line-up. PROLONGED ACTIVITY IN A FLEXED POSITION When completing a task that requires you to bend forward at your waist or lean over a low surface, try to find a way to stabilize 3 out of 4 of your  limbs. You can place a hand or elbow on your thigh or rest a knee on the surface you are reaching across. This will provide you more stability, so that your muscles do not tire as quickly. By keeping your knees relaxed, or slightly bent, you will also reduce stress across your lower back. CORRECT LIFTING TECHNIQUES DO :  Assume a wide stance. This will provide you more stability and the opportunity to get as close as possible to the object which you are lifting.  Tense your abdominals to brace your spine. Bend at the knees and hips. Keeping your back locked in a neutral-spine position, lift using your leg muscles. Lift with your legs, keeping your back straight.  Test the weight of unknown objects before attempting to lift them.  Try to keep your elbows locked down at your sides in order get the best strength from your shoulders when carrying an object.  Always ask for help when lifting heavy or awkward objects. INCORRECT LIFTING TECHNIQUES DO NOT:   Lock your knees when lifting, even if it is a small object.  Bend and twist. Pivot at your feet or move your feet when needing to change directions.  Assume that you can safely pick up even a paperclip without proper posture. Document Released: 06/05/2005 Document Revised: 08/28/2011 Document Reviewed: 09/17/2008 [facility] Patient Information 2015 [place], [place]. This information is not intended to replace advice given to you by your health care provider. Make sure you discuss any questions you have with your health care provider.

## 2014-12-22 LAB — CYTOLOGY - PAP

## 2015-02-08 ENCOUNTER — Emergency Department (HOSPITAL_COMMUNITY)
Admission: EM | Admit: 2015-02-08 | Discharge: 2015-02-08 | Disposition: A | Discharge status: Home / Self Care | Payer: Medicaid Other | Attending: Emergency Medicine | Admitting: Emergency Medicine

## 2015-02-08 ENCOUNTER — Encounter (HOSPITAL_COMMUNITY): Payer: Self-pay | Admitting: *Deleted

## 2015-02-08 DIAGNOSIS — Z79899 Other long term (current) drug therapy: Secondary | ICD-10-CM | POA: Insufficient documentation

## 2015-02-08 DIAGNOSIS — R197 Diarrhea, unspecified: Secondary | ICD-10-CM | POA: Insufficient documentation

## 2015-02-08 DIAGNOSIS — Z720 Tobacco use: Secondary | ICD-10-CM | POA: Diagnosis not present

## 2015-02-08 DIAGNOSIS — Z793 Long term (current) use of hormonal contraceptives: Secondary | ICD-10-CM | POA: Insufficient documentation

## 2015-02-08 DIAGNOSIS — J45909 Unspecified asthma, uncomplicated: Secondary | ICD-10-CM | POA: Diagnosis not present

## 2015-02-08 DIAGNOSIS — I10 Essential (primary) hypertension: Secondary | ICD-10-CM | POA: Insufficient documentation

## 2015-02-08 DIAGNOSIS — Z791 Long term (current) use of non-steroidal anti-inflammatories (NSAID): Secondary | ICD-10-CM | POA: Insufficient documentation

## 2015-02-08 DIAGNOSIS — Z872 Personal history of diseases of the skin and subcutaneous tissue: Secondary | ICD-10-CM | POA: Diagnosis not present

## 2015-02-08 DIAGNOSIS — N12 Tubulo-interstitial nephritis, not specified as acute or chronic: Secondary | ICD-10-CM | POA: Diagnosis not present

## 2015-02-08 DIAGNOSIS — R509 Fever, unspecified: Secondary | ICD-10-CM | POA: Insufficient documentation

## 2015-02-08 DIAGNOSIS — R1030 Lower abdominal pain, unspecified: Secondary | ICD-10-CM | POA: Diagnosis present

## 2015-02-08 LAB — WET PREP, GENITAL
Clue Cells Wet Prep HPF POC: NONE SEEN
Trich, Wet Prep: NONE SEEN
Yeast Wet Prep HPF POC: NONE SEEN

## 2015-02-08 LAB — COMPREHENSIVE METABOLIC PANEL
ALK PHOS: 72 U/L (ref 38–126)
ALT: 23 U/L (ref 14–54)
AST: 24 U/L (ref 15–41)
Albumin: 4 g/dL (ref 3.5–5.0)
Anion gap: 8 (ref 5–15)
BUN: 6 mg/dL (ref 6–20)
CO2: 28 mmol/L (ref 22–32)
CREATININE: 0.98 mg/dL (ref 0.44–1.00)
Calcium: 9.6 mg/dL (ref 8.9–10.3)
Chloride: 104 mmol/L (ref 101–111)
Glucose, Bld: 107 mg/dL — ABNORMAL HIGH (ref 65–99)
Potassium: 4.4 mmol/L (ref 3.5–5.1)
Sodium: 140 mmol/L (ref 135–145)
Total Bilirubin: 0.6 mg/dL (ref 0.3–1.2)
Total Protein: 7.3 g/dL (ref 6.5–8.1)

## 2015-02-08 LAB — CBC
HCT: 41.8 % (ref 36.0–46.0)
Hemoglobin: 14.4 g/dL (ref 12.0–15.0)
MCH: 33.1 pg (ref 26.0–34.0)
MCHC: 34.4 g/dL (ref 30.0–36.0)
MCV: 96.1 fL (ref 78.0–100.0)
PLATELETS: 188 10*3/uL (ref 150–400)
RBC: 4.35 MIL/uL (ref 3.87–5.11)
RDW: 13.4 % (ref 11.5–15.5)
WBC: 12.9 10*3/uL — AB (ref 4.0–10.5)

## 2015-02-08 LAB — URINALYSIS, ROUTINE W REFLEX MICROSCOPIC
Bilirubin Urine: NEGATIVE
Glucose, UA: NEGATIVE mg/dL
KETONES UR: NEGATIVE mg/dL
NITRITE: NEGATIVE
Protein, ur: >300 mg/dL — AB
Specific Gravity, Urine: 1.011 (ref 1.005–1.030)
Urobilinogen, UA: 1 mg/dL (ref 0.0–1.0)
pH: 7 (ref 5.0–8.0)

## 2015-02-08 LAB — LIPASE, BLOOD: LIPASE: 19 U/L — AB (ref 22–51)

## 2015-02-08 LAB — URINE MICROSCOPIC-ADD ON

## 2015-02-08 LAB — POC URINE PREG, ED: PREG TEST UR: NEGATIVE

## 2015-02-08 MED ORDER — TRAMADOL HCL 50 MG PO TABS
50.0000 mg | ORAL_TABLET | Freq: Once | ORAL | Status: AC
  Administered 2015-02-08: 50 mg via ORAL
  Filled 2015-02-08: qty 1

## 2015-02-08 MED ORDER — ONDANSETRON 4 MG PO TBDP
ORAL_TABLET | ORAL | Status: AC
  Filled 2015-02-08: qty 1

## 2015-02-08 MED ORDER — OXYCODONE-ACETAMINOPHEN 5-325 MG PO TABS: 1.0000 | ORAL_TABLET | ORAL | Status: DC | PRN

## 2015-02-08 MED ORDER — SODIUM CHLORIDE 0.9 % IV BOLUS (SEPSIS)
1000.0000 mL | Freq: Once | INTRAVENOUS | Status: AC
  Administered 2015-02-08: 1000 mL via INTRAVENOUS

## 2015-02-08 MED ORDER — ONDANSETRON 4 MG PO TBDP
4.0000 mg | ORAL_TABLET | Freq: Once | ORAL | Status: AC | PRN
  Administered 2015-02-08: 4 mg via ORAL

## 2015-02-08 MED ORDER — ONDANSETRON HCL 4 MG/2ML IJ SOLN
4.0000 mg | Freq: Once | INTRAMUSCULAR | Status: AC
  Administered 2015-02-08: 4 mg via INTRAVENOUS
  Filled 2015-02-08: qty 2

## 2015-02-08 MED ORDER — IBUPROFEN 400 MG PO TABS
800.0000 mg | ORAL_TABLET | Freq: Once | ORAL | Status: AC
  Administered 2015-02-08: 800 mg via ORAL
  Filled 2015-02-08: qty 2

## 2015-02-08 MED ORDER — ONDANSETRON HCL 4 MG PO TABS: 4.0000 mg | ORAL_TABLET | Freq: Four times a day (QID) | ORAL | Status: DC

## 2015-02-08 MED ORDER — OXYCODONE-ACETAMINOPHEN 5-325 MG PO TABS
1.0000 | ORAL_TABLET | Freq: Once | ORAL | Status: AC
  Administered 2015-02-08: 1 via ORAL
  Filled 2015-02-08: qty 1

## 2015-02-08 MED ORDER — CIPROFLOXACIN HCL 500 MG PO TABS: 500.0000 mg | ORAL_TABLET | Freq: Two times a day (BID) | ORAL | Status: DC

## 2015-02-08 MED ORDER — ACETAMINOPHEN 325 MG PO TABS
325.0000 mg | ORAL_TABLET | Freq: Once | ORAL | Status: DC
Start: 2015-02-08 — End: 2015-02-08

## 2015-02-08 MED ORDER — DEXTROSE 5 % IV SOLN
1.0000 g | Freq: Once | INTRAVENOUS | Status: AC
  Administered 2015-02-08: 1 g via INTRAVENOUS
  Filled 2015-02-08: qty 10

## 2015-02-08 NOTE — ED Notes (Signed)
PA at bedside.

## 2015-02-08 NOTE — ED Provider Notes (Signed)
CSN: [id number]     Arrival date & time 02/08/15  1104 History  This chart was scribed for Okey Regal, PA-C, working with Fredia Sorrow, MD by Starleen Arms, ED Scribe. This patient was seen in room TR11C/TR11C and the patient's care was started at 4:14 PM.   Chief Complaint  Patient presents with  . Back Pain  . Abdominal Pain  . Emesis  . Nausea  . Diarrhea  . Fever   Patient is a 29 y.o. female presenting with abdominal pain, vomiting, diarrhea, and fever. The history is provided by the patient. No language interpreter was used.  Abdominal Pain Emesis Diarrhea Fever  HPI Comments: [name] [name] is a 29 y.o. female who presents to the Emergency Department complaining of gradual onset, 8/10, constant, worsening, squeezing, worse with sitting up lower abdominal pain radiating around to the back onset 4:00 pm yesterday.  Initially the complaint was periumbical and LLQ but has become bilateral side and flanl over the past several hours, worse on the left.  Associated symptoms include nausea, diarrhea (3 days ago only), and vomiting (not tolerating food or fluids until 1 hour ago when she had 12 oz of water).  Nothing has been tried for this complaint.  Patient reports a history of inpatient hospitalization for pyelonephritis and that this episode feels similar but less severe.  She denies vaginal discharge, vaginal bleeding, dysuria.   Past Medical History  Diagnosis Date  . Eczema   . Hypertension   . Seasonal allergies   . Lactose intolerance   . Asthma, allergic     uses inhaler prn - infrequently   Past Surgical History  Procedure Laterality Date  . Wisdom tooth extraction     Family History  Problem Relation Age of Onset  . Adopted: Yes  . Hypertension Father   . Hypertension Maternal Aunt   . Hypertension Maternal Uncle   . Hypertension Maternal Grandmother   . Eczema Maternal Grandmother   . Hypertension Maternal Grandfather   . Hypertension Paternal Grandmother    . Hypertension Mother   . Eczema Mother    Social History  Substance Use Topics  . Smoking status: Current Every Day Smoker -- 0.25 packs/day    Types: Cigarettes  . Smokeless tobacco: Former Systems developer  . Alcohol Use: No   OB History    Gravida Para Term Preterm AB TAB SAB Ectopic Multiple Living   2 2 2       0 2     Review of Systems  All other systems reviewed and are negative.     Allergies  Sulfa antibiotics and Shellfish allergy  Home Medications   Prior to Admission medications   Medication Sig Start Date End Date Taking? Authorizing Provider  acetaminophen (TYLENOL) 325 MG tablet Take 325 mg by mouth every 6 (six) hours as needed for moderate pain.    Historical Provider, MD  acetaminophen-codeine (TYLENOL #3) 300-30 MG per tablet Take 2 tablets by mouth every 4 (four) hours as needed for moderate pain. Patient not taking: Reported on 12/17/2014 11/17/14   Jonnie Kind, MD  amLODipine (NORVASC) 10 MG tablet Take 1 tablet (10 mg total) by mouth daily. 11/17/14   Jonnie Kind, MD  cetirizine (ZYRTEC) 10 MG tablet Take 1 tablet (10 mg total) by mouth daily. 11/15/14   Lori A Clemmons, CNM  ciprofloxacin (CIPRO) 500 MG tablet Take 1 tablet (500 mg total) by mouth every 12 (twelve) hours. 02/08/15   Okey Regal, PA-C  docusate  sodium (COLACE) 100 MG capsule Take 100 mg by mouth 2 (two) times daily.    Historical Provider, MD  ibuprofen (ADVIL,MOTRIN) 600 MG tablet Take 1 tablet (600 mg total) by mouth every 6 (six) hours. 11/17/14   Jonnie Kind, MD  levonorgestrel (MIRENA) 20 MCG/24HR IUD 1 Intra Uterine Device (1 each total) by Intrauterine route once. 12/17/14   Donnamae Jude, MD  montelukast (SINGULAIR) 10 MG tablet Take 1 tablet (10 mg total) by mouth at bedtime. 11/15/14   Lori A Clemmons, CNM  omeprazole (PRILOSEC OTC) 20 MG tablet Take 1 tablet (20 mg total) by mouth daily. Patient not taking: Reported on 12/17/2014 08/12/14   Manya Silvas, CNM  ondansetron (ZOFRAN)  4 MG tablet Take 1 tablet (4 mg total) by mouth every 6 (six) hours. 02/08/15   Okey Regal, PA-C  oxyCODONE-acetaminophen (PERCOCET/ROXICET) 5-325 MG per tablet Take 1 tablet by mouth every 4 (four) hours as needed for severe pain. 02/08/15   Okey Regal, PA-C  Prenatal Vit-Fe Fumarate-FA (PRENATAL MULTIVITAMIN) TABS tablet Take 1 tablet by mouth daily at 12 noon.    Historical Provider, MD  triamcinolone cream (KENALOG) 0.1 % Apply 1 application topically 2 (two) times daily as needed. To affected areas for eczema.    Historical Provider, MD   BP 130/80 mmHg  Pulse 93  Temp(Src) 98.9 F (37.2 C) (Oral)  Resp 24  Wt 221 lb 6 oz (100.415 kg)  SpO2 96% Physical Exam  Constitutional: She is oriented to person, place, and time. She appears well-developed and well-nourished. No distress.  HENT:  Head: Normocephalic and atraumatic.  Eyes: Conjunctivae and EOM are normal.  Neck: Neck supple. No tracheal deviation present.  Cardiovascular: Exam reveals no gallop and no friction rub.   No murmur heard. Pulmonary/Chest: Effort normal and breath sounds normal. No respiratory distress. She has no wheezes. She has no rales. She exhibits no tenderness.  Abdominal: Soft. Bowel sounds are normal. She exhibits no distension and no mass. There is no hepatosplenomegaly, splenomegaly or hepatomegaly. There is no tenderness. There is no rigidity, no rebound, no guarding, no CVA tenderness, no tenderness at McBurney's point and negative Murphy's sign.  Genitourinary: Vagina normal and uterus normal. Cervix exhibits no motion tenderness, no discharge and no friability. Right adnexum displays no mass, no tenderness and no fullness. Left adnexum displays no mass, no tenderness and no fullness. No erythema, tenderness or bleeding in the vagina. No foreign body around the vagina. No signs of injury around the vagina. No vaginal discharge found.  Musculoskeletal: Normal range of motion.  Neurological: She is alert  and oriented to person, place, and time.  Skin: Skin is warm and dry.  Psychiatric: She has a normal mood and affect. Her behavior is normal.  Nursing note and vitals reviewed.   ED Course  Procedures (including critical care time)  DIAGNOSTIC STUDIES: Oxygen Saturation is 99% on RA, normal by my interpretation.    COORDINATION OF CARE:  4:23 PM Discussed treatment plan with patient at bedside.  Patient acknowledges and agrees with plan.     Labs Review Labs Reviewed  WET PREP, GENITAL - Abnormal; Notable for the following:    WBC, Wet Prep HPF POC FEW (*)    All other components within normal limits  LIPASE, BLOOD - Abnormal; Notable for the following:    Lipase 19 (*)    All other components within normal limits  COMPREHENSIVE METABOLIC PANEL - Abnormal; Notable for the following:  Glucose, Bld 107 (*)    All other components within normal limits  CBC - Abnormal; Notable for the following:    WBC 12.9 (*)    All other components within normal limits  URINALYSIS, ROUTINE W REFLEX MICROSCOPIC (NOT AT [facility]) - Abnormal; Notable for the following:    APPearance CLOUDY (*)    Hgb urine dipstick MODERATE (*)    Protein, ur >300 (*)    Leukocytes, UA LARGE (*)    All other components within normal limits  URINE MICROSCOPIC-ADD ON - Abnormal; Notable for the following:    Bacteria, UA FEW (*)    All other components within normal limits  URINE CULTURE  POC URINE PREG, ED  GC/CHLAMYDIA PROBE AMP ([place]) NOT AT [facility]    Imaging Review No results found. I have personally reviewed and evaluated these images and lab results as part of my medical decision-making.   EKG Interpretation None      MDM   Final diagnoses:  Pyelonephritis      Labs: CBC, CMET, Lipase, UA, Urine pregnancy- significant for UTI, neg preg  Imaging:   Therapeutics: Zofran 4 mg; Tramadol 50 mg; Percocet 5-325, Rocephin 2 g  Assessment/Plan: Pt presentation most likely represents  pyelonephritis. Pt has urine and complaints consistent with this diagnoses and history of the same. Pt reports having to be managed inpatient previously for this but admits that she waited until she was extremely sick, she does not feel this is as sever. Pt was originally afebrile at presentation but spiked a fever that was managed with antipyretics. Pt was tolerating PO prior to my initial evaluation and continued to do the same during her stay. She was given a dose of Iv antibiotics here in the ED with PO for home. Pt had complaints of abdominal pain originally with initial concern for appendicitis on the differential. The pain localized to the flank, she had not point tenderness on abd exam; specifically in the right lower quadrant. Pts labs were reassuring with normal kidney function, although this was slightly increased over her previous visits. PID on the differential, although very unlikely due to benign pelvic with no sig discharge of cervical motion tenderness. Pt has no history of kidney stones, this is unlikely in her case. Pt was discharged home with her boyfriend who seem to be reliable and acutely concerned for her wellbeing. She was instructed to return the ED immediately if symptoms worsened or she was unable to tolerate PO antibiotics. Patient verbalized her understanding and agreement to today's plan and assured her follow-up evaluation in 3-5 days if symptoms improve and sooner as needed.   Discharge Meds: Cipro, Percocet, Zofran   I personally performed the services described in this documentation, which was scribed in my presence. The recorded information has been reviewed and is accurate.   Okey Regal, PA-C 02/09/15 [place], MD 02/12/15 2018

## 2015-02-08 NOTE — ED Notes (Signed)
Patient feeling nauseas, meds ordered

## 2015-02-08 NOTE — ED Notes (Signed)
Patient with onset of left lower abd pain from naval line to her back on the left side since last night.  She has had nv/d and reported fever last night.  Patient denies any urinary sx.  She states she lost count of emesis.  Patient last emesis was at 10.  Patient is also hypertensive.  Has hx of same.  States she took her last med was last night

## 2015-02-08 NOTE — ED Notes (Signed)
Pelvic cart set up at bedside  

## 2015-02-08 NOTE — ED Notes (Signed)
Patient states she is feeling much better. Verbalized understanding of signs and symptoms of when to return.

## 2015-02-08 NOTE — Discharge Instructions (Signed)
Pyelonephritis, Adult Pyelonephritis is a kidney infection. In general, there are 2 main types of pyelonephritis:  Infections that come on quickly without any warning (acute pyelonephritis).  Infections that persist for a long period of time (chronic pyelonephritis). CAUSES  Two main causes of pyelonephritis are:  Bacteria traveling from the bladder to the kidney. This is a problem especially in pregnant women. The urine in the bladder can become filled with bacteria from multiple causes, including:  Inflammation of the prostate gland (prostatitis).  Sexual intercourse in females.  Bladder infection (cystitis).  Bacteria traveling from the bloodstream to the tissue part of the kidney. Problems that may increase your risk of getting a kidney infection include:  Diabetes.  Kidney stones or bladder stones.  Cancer.  Catheters placed in the bladder.  Other abnormalities of the kidney or ureter. SYMPTOMS   Abdominal pain.  Pain in the side or flank area.  Fever.  Chills.  Upset stomach.  Blood in the urine (dark urine).  Frequent urination.  Strong or persistent urge to urinate.  Burning or stinging when urinating. DIAGNOSIS  Your caregiver may diagnose your kidney infection based on your symptoms. A urine sample may also be taken. TREATMENT  In general, treatment depends on how severe the infection is.   If the infection is mild and caught early, your caregiver may treat you with oral antibiotics and send you home.  If the infection is more severe, the bacteria may have gotten into the bloodstream. This will require intravenous (IV) antibiotics and a hospital stay. Symptoms may include:  High fever.  Severe flank pain.  Shaking chills.  Even after a hospital stay, your caregiver may require you to be on oral antibiotics for a period of time.  Other treatments may be required depending upon the cause of the infection. HOME CARE INSTRUCTIONS   Take your  antibiotics as directed. Finish them even if you start to feel better.  Make an appointment to have your urine checked to make sure the infection is gone.  Drink enough fluids to keep your urine clear or pale yellow.  Take medicines for the bladder if you have urgency and frequency of urination as directed by your caregiver. SEEK IMMEDIATE MEDICAL CARE IF:   You have a fever or persistent symptoms for more than 2-3 days.  You have a fever and your symptoms suddenly get worse.  You are unable to take your antibiotics or fluids.  You develop shaking chills.  You experience extreme weakness or fainting.  There is no improvement after 2 days of treatment. MAKE SURE YOU:  Understand these instructions.  Will watch your condition.  Will get help right away if you are not doing well or get worse. Document Released: 06/05/2005 Document Revised: 12/05/2011 Document Reviewed: 11/09/2010 [facility] Patient Information 2015 [place], [place]. This information is not intended to replace advice given to you by your health care provider. Make sure you discuss any questions you have with your health care provider.  Please use medication as directed. Please monitor for new or worsening signs or symptoms, return immediately if any present. Please contact her primary care provider and schedule follow-up evaluation. Please return to the emergency room if any concerning signs or symptoms present.

## 2015-02-09 LAB — GC/CHLAMYDIA PROBE AMP (~~LOC~~) NOT AT ARMC
Chlamydia: NEGATIVE
Neisseria Gonorrhea: NEGATIVE

## 2015-02-11 LAB — URINE CULTURE
Culture: >=100000
Special Requests: NORMAL

## 2015-02-12 ENCOUNTER — Telehealth (HOSPITAL_BASED_OUTPATIENT_CLINIC_OR_DEPARTMENT_OTHER): Payer: Self-pay | Admitting: Emergency Medicine

## 2015-02-12 NOTE — Telephone Encounter (Signed)
Post ED Visit - Positive Culture Follow-up  Culture report reviewed by antimicrobial stewardship pharmacist: []  Wes [place], Pharm.D., BCPS []  Heide Guile, Pharm.D., BCPS []  Alycia Rossetti, Pharm.D., BCPS []  [place], Pharm.D., BCPS, AAHIVP []  Legrand Como, Pharm.D., BCPS, AAHIVP []  Isac Sarna, Pharm.D., BCPS Dimitri Ped PharmD  Positive urine culture E. coli Treated with ciprofloxacin, organism sensitive to the same and no further patient follow-up is required at this time.  Hazle Nordmann 02/12/2015, 9:15 AM

## 2015-06-01 ENCOUNTER — Ambulatory Visit: Payer: Medicaid Other | Attending: Family Medicine | Admitting: Family Medicine

## 2015-06-01 ENCOUNTER — Encounter: Payer: Self-pay | Admitting: Family Medicine

## 2015-06-01 VITALS — BP 145/107 | HR 54 | Temp 98.4°F | Resp 14 | Ht 67.0 in | Wt 201.0 lb

## 2015-06-01 DIAGNOSIS — Z131 Encounter for screening for diabetes mellitus: Secondary | ICD-10-CM | POA: Diagnosis present

## 2015-06-01 DIAGNOSIS — I10 Essential (primary) hypertension: Secondary | ICD-10-CM | POA: Diagnosis not present

## 2015-06-01 DIAGNOSIS — Z720 Tobacco use: Secondary | ICD-10-CM

## 2015-06-01 DIAGNOSIS — F39 Unspecified mood [affective] disorder: Secondary | ICD-10-CM | POA: Diagnosis not present

## 2015-06-01 DIAGNOSIS — J302 Other seasonal allergic rhinitis: Secondary | ICD-10-CM

## 2015-06-01 DIAGNOSIS — L309 Dermatitis, unspecified: Secondary | ICD-10-CM

## 2015-06-01 DIAGNOSIS — R4586 Emotional lability: Secondary | ICD-10-CM

## 2015-06-01 DIAGNOSIS — Z0000 Encounter for general adult medical examination without abnormal findings: Secondary | ICD-10-CM | POA: Diagnosis not present

## 2015-06-01 HISTORY — DX: Emotional lability: R45.86

## 2015-06-01 LAB — POCT GLYCOSYLATED HEMOGLOBIN (HGB A1C): Hemoglobin A1C: 4.8

## 2015-06-01 MED ORDER — CETIRIZINE HCL 10 MG PO TABS: 10.0000 mg | ORAL_TABLET | Freq: Every day | ORAL | Status: DC

## 2015-06-01 MED ORDER — TRIAMCINOLONE ACETONIDE 0.1 % EX CREA: 1.0000 "application " | TOPICAL_CREAM | Freq: Two times a day (BID) | CUTANEOUS | Status: DC | PRN

## 2015-06-01 MED ORDER — AMLODIPINE BESYLATE 10 MG PO TABS: 10.0000 mg | ORAL_TABLET | Freq: Every day | ORAL | Status: DC

## 2015-06-01 MED ORDER — MONTELUKAST SODIUM 10 MG PO TABS: 10.0000 mg | ORAL_TABLET | Freq: Every day | ORAL | Status: DC

## 2015-06-01 MED ORDER — SERTRALINE HCL 50 MG PO TABS: 50.0000 mg | ORAL_TABLET | Freq: Every day | ORAL | Status: DC

## 2015-06-01 NOTE — Patient Instructions (Signed)
Hypertension Hypertension, commonly called high blood pressure, is when the force of blood pumping through your arteries is too strong. Your arteries are the blood vessels that carry blood from your heart throughout your body. A blood pressure reading consists of a higher number over a lower number, such as 110/72. The higher number (systolic) is the pressure inside your arteries when your heart pumps. The lower number (diastolic) is the pressure inside your arteries when your heart relaxes. Ideally you want your blood pressure below 120/80. Hypertension forces your heart to work harder to pump blood. Your arteries may become narrow or stiff. Having untreated or uncontrolled hypertension can cause heart attack, stroke, kidney disease, and other problems. RISK FACTORS Some risk factors for high blood pressure are controllable. Others are not.  Risk factors you cannot control include:   Race. You may be at higher risk if you are African American.  Age. Risk increases with age.  Gender. Men are at higher risk than women before age 45 years. After age 65, women are at higher risk than men. Risk factors you can control include:  Not getting enough exercise or physical activity.  Being overweight.  Getting too much fat, sugar, calories, or salt in your diet.  Drinking too much alcohol. SIGNS AND SYMPTOMS Hypertension does not usually cause signs or symptoms. Extremely high blood pressure (hypertensive crisis) may cause headache, anxiety, shortness of breath, and nosebleed. DIAGNOSIS To check if you have hypertension, your health care provider will measure your blood pressure while you are seated, with your arm held at the level of your heart. It should be measured at least twice using the same arm. Certain conditions can cause a difference in blood pressure between your right and left arms. A blood pressure reading that is higher than normal on one occasion does not mean that you need treatment. If  it is not clear whether you have high blood pressure, you may be asked to return on a different day to have your blood pressure checked again. Or, you may be asked to monitor your blood pressure at home for 1 or more weeks. TREATMENT Treating high blood pressure includes making lifestyle changes and possibly taking medicine. Living a healthy lifestyle can help lower high blood pressure. You may need to change some of your habits. Lifestyle changes may include:  Following the DASH diet. This diet is high in fruits, vegetables, and whole grains. It is low in salt, red meat, and added sugars.  Keep your sodium intake below 2,300 mg per day.  Getting at least 30-45 minutes of aerobic exercise at least 4 times per week.  Losing weight if necessary.  Not smoking.  Limiting alcoholic beverages.  Learning ways to reduce stress. Your health care provider may prescribe medicine if lifestyle changes are not enough to get your blood pressure under control, and if one of the following is true:  You are 18-59 years of age and your systolic blood pressure is above 140.  You are 60 years of age or older, and your systolic blood pressure is above 150.  Your diastolic blood pressure is above 90.  You have diabetes, and your systolic blood pressure is over 140 or your diastolic blood pressure is over 90.  You have kidney disease and your blood pressure is above 140/90.  You have heart disease and your blood pressure is above 140/90. Your personal target blood pressure may vary depending on your medical conditions, your age, and other factors. HOME CARE INSTRUCTIONS    Have your blood pressure rechecked as directed by your health care provider.   Take medicines only as directed by your health care provider. Follow the directions carefully. Blood pressure medicines must be taken as prescribed. The medicine does not work as well when you skip doses. Skipping doses also puts you at risk for  problems.  Do not smoke.   Monitor your blood pressure at home as directed by your health care provider. SEEK MEDICAL CARE IF:   You think you are having a reaction to medicines taken.  You have recurrent headaches or feel dizzy.  You have swelling in your ankles.  You have trouble with your vision. SEEK IMMEDIATE MEDICAL CARE IF:  You develop a severe headache or confusion.  You have unusual weakness, numbness, or feel faint.  You have severe chest or abdominal pain.  You vomit repeatedly.  You have trouble breathing. MAKE SURE YOU:   Understand these instructions.  Will watch your condition.  Will get help right away if you are not doing well or get worse.   This information is not intended to replace advice given to you by your health care provider. Make sure you discuss any questions you have with your health care provider.   Document Released: 06/05/2005 Document Revised: 10/20/2014 Document Reviewed: 03/28/2013 Elsevier Interactive Patient Education 2016 [employer]  

## 2015-06-01 NOTE — Progress Notes (Signed)
Patient here to establish care She has been out of medications and needs refills She will take a flu shot

## 2015-06-01 NOTE — Progress Notes (Signed)
Subjective:  Patient ID: [name] [name], female    DOB: [birth year]  Age: 29 y.o. MRN: [id number]  CC: Establish Care   HPI [name] [name] is 29 year old female with a history of hypertension, seasonal allergies, eczema, tobacco abuse who comes into the clinic to establish care. Her blood pressure is elevated and she admits to being out of antihypertensives for some time now.  She is also needing refills of her Kenalog which she takes for eczema and her medications seasonal allergies. She complains of mood swings which worsened emphysema and she cannot come Mirena inserted in 11/2014 and complains of snapping at people easily. She denies frank anxiety or being overly depressed but is open to being placed on medications to help symptoms as she states she needs to keep the IUD.  She was seen by ophthalmology for refractory errors and referred to another ophthalmology specialist but states she was informed they did not take medications; on further questioning in speaking with a referral coordinator was discovered at that she needed contacts or glasses which Medicaid did not cover and the patient states she is unable to afford this.  Outpatient Prescriptions Prior to Visit  Medication Sig Dispense Refill  . acetaminophen (TYLENOL) 325 MG tablet Take 325 mg by mouth every 6 (six) hours as needed for moderate pain.    Marland Kitchen acetaminophen-codeine (TYLENOL #3) 300-30 MG per tablet Take 2 tablets by mouth every 4 (four) hours as needed for moderate pain. (Patient not taking: Reported on 12/17/2014) 30 tablet 0  . levonorgestrel (MIRENA) 20 MCG/24HR IUD 1 Intra Uterine Device (1 each total) by Intrauterine route once. 1 each 0  . amLODipine (NORVASC) 10 MG tablet Take 1 tablet (10 mg total) by mouth daily. 30 tablet 2  . cetirizine (ZYRTEC) 10 MG tablet Take 1 tablet (10 mg total) by mouth daily. 30 tablet 1  . ciprofloxacin (CIPRO) 500 MG tablet Take 1 tablet (500 mg total) by mouth every 12 (twelve)  hours. 14 tablet 0  . docusate sodium (COLACE) 100 MG capsule Take 100 mg by mouth 2 (two) times daily.    Marland Kitchen ibuprofen (ADVIL,MOTRIN) 600 MG tablet Take 1 tablet (600 mg total) by mouth every 6 (six) hours. 30 tablet 0  . montelukast (SINGULAIR) 10 MG tablet Take 1 tablet (10 mg total) by mouth at bedtime. 30 tablet 1  . omeprazole (PRILOSEC OTC) 20 MG tablet Take 1 tablet (20 mg total) by mouth daily. (Patient not taking: Reported on 12/17/2014) 30 tablet 6  . ondansetron (ZOFRAN) 4 MG tablet Take 1 tablet (4 mg total) by mouth every 6 (six) hours. 12 tablet 0  . oxyCODONE-acetaminophen (PERCOCET/ROXICET) 5-325 MG per tablet Take 1 tablet by mouth every 4 (four) hours as needed for severe pain. 10 tablet 0  . Prenatal Vit-Fe Fumarate-FA (PRENATAL MULTIVITAMIN) TABS tablet Take 1 tablet by mouth daily at 12 noon.    . triamcinolone cream (KENALOG) 0.1 % Apply 1 application topically 2 (two) times daily as needed. To affected areas for eczema.     No facility-administered medications prior to visit.    ROS Review of Systems  Constitutional: Negative for activity change, appetite change and fatigue.  HENT: Negative for congestion, sinus pressure and sore throat.   Eyes: Positive for visual disturbance.  Respiratory: Negative for cough, chest tightness, shortness of breath and wheezing.   Cardiovascular: Negative for chest pain and palpitations.  Gastrointestinal: Negative for abdominal pain, constipation and abdominal distention.  Endocrine: Negative for polydipsia.  Genitourinary:  Negative for dysuria and frequency.  Musculoskeletal: Negative for back pain and arthralgias.  Skin: Positive for rash.  Neurological: Negative for tremors, light-headedness and numbness.  Hematological: Does not bruise/bleed easily.  Psychiatric/Behavioral: Positive for dysphoric mood. Negative for behavioral problems and agitation.    Objective:  BP 145/107 mmHg  Pulse 54  Temp(Src) 98.4 F (36.9 C)  Resp  14  Ht 5\' 7"  (1.702 m)  Wt 201 lb (91.173 kg)  BMI 31.47 kg/m2  SpO2 98%  LMP 06/01/2015  BP/Weight 06/01/2015 02/08/2015 0000000  Systolic BP [id number] 0000000  Diastolic BP [id number] 80 92  Wt. (Lbs) 201 221.38 220  BMI 31.47 33.67 33.46      Physical Exam  Constitutional: She is oriented to person, place, and time. She appears well-developed and well-nourished.  Cardiovascular: Normal heart sounds and intact distal pulses.  Bradycardia present.   No murmur heard. Pulmonary/Chest: Effort normal and breath sounds normal. She has no wheezes. She has no rales. She exhibits no tenderness.  Abdominal: Soft. Bowel sounds are normal. She exhibits no distension and no mass. There is no tenderness.  Musculoskeletal: Normal range of motion.  Neurological: She is alert and oriented to person, place, and time.  Skin: Skin is warm and dry. Rash (chronic eczematous rash on flexor aspect of ankles elbows upper back) noted.  Psychiatric: She has a normal mood and affect.     Assessment & Plan:   1. Diabetes mellitus screening A1c is normal - HgB A1c  2. Essential hypertension Uncontrolled due to being out of medications Fasting labs at next visit. - amLODipine (NORVASC) 10 MG tablet; Take 1 tablet (10 mg total) by mouth daily.  Dispense: 30 tablet; Refill: 2  3. Tobacco abuse Spent 3 minutes discussing cessation and she is not ready to quit at this time  4. Eczema Uncontrolled due to being out of medications - triamcinolone cream (KENALOG) 0.1 %; Apply 1 application topically 2 (two) times daily as needed. To affected areas for eczema.  Dispense: 80 g; Refill: 1  5. Seasonal allergies Stable - cetirizine (ZYRTEC) 10 MG tablet; Take 1 tablet (10 mg total) by mouth daily.  Dispense: 30 tablet; Refill: 2 - montelukast (SINGULAIR) 10 MG tablet; Take 1 tablet (10 mg total) by mouth at bedtime.  Dispense: 30 tablet; Refill: 2  6. Mood swings (HCC) It is possible that symptoms could be brought  upon by current Mirena in situ. We'll reassess at next visit for improvement in symptoms - sertraline (ZOLOFT) 50 MG tablet; Take 1 tablet (50 mg total) by mouth daily.  Dispense: 30 tablet; Refill: 2   Meds ordered this encounter  Medications  . amLODipine (NORVASC) 10 MG tablet    Sig: Take 1 tablet (10 mg total) by mouth daily.    Dispense:  30 tablet    Refill:  2  . cetirizine (ZYRTEC) 10 MG tablet    Sig: Take 1 tablet (10 mg total) by mouth daily.    Dispense:  30 tablet    Refill:  2  . triamcinolone cream (KENALOG) 0.1 %    Sig: Apply 1 application topically 2 (two) times daily as needed. To affected areas for eczema.    Dispense:  80 g    Refill:  1  . montelukast (SINGULAIR) 10 MG tablet    Sig: Take 1 tablet (10 mg total) by mouth at bedtime.    Dispense:  30 tablet    Refill:  2  . sertraline (ZOLOFT) 50  MG tablet    Sig: Take 1 tablet (50 mg total) by mouth daily.    Dispense:  30 tablet    Refill:  2    Follow-up: Return in about 1 month (around 07/02/2015) for Follow-up on hypertension and mood swings.   Arnoldo Morale MD

## 2015-07-02 ENCOUNTER — Encounter: Payer: Self-pay | Admitting: Family Medicine

## 2015-07-02 ENCOUNTER — Ambulatory Visit: Payer: Medicaid Other | Attending: Family Medicine | Admitting: Family Medicine

## 2015-07-02 VITALS — BP 122/78 | HR 65 | Temp 98.2°F | Resp 16 | Ht 67.0 in | Wt 195.0 lb

## 2015-07-02 DIAGNOSIS — M546 Pain in thoracic spine: Secondary | ICD-10-CM | POA: Diagnosis not present

## 2015-07-02 DIAGNOSIS — N926 Irregular menstruation, unspecified: Secondary | ICD-10-CM | POA: Diagnosis not present

## 2015-07-02 DIAGNOSIS — L309 Dermatitis, unspecified: Secondary | ICD-10-CM | POA: Diagnosis not present

## 2015-07-02 DIAGNOSIS — Z79899 Other long term (current) drug therapy: Secondary | ICD-10-CM | POA: Insufficient documentation

## 2015-07-02 DIAGNOSIS — Z720 Tobacco use: Secondary | ICD-10-CM

## 2015-07-02 DIAGNOSIS — Z6830 Body mass index (BMI) 30.0-30.9, adult: Secondary | ICD-10-CM | POA: Diagnosis not present

## 2015-07-02 DIAGNOSIS — I10 Essential (primary) hypertension: Secondary | ICD-10-CM | POA: Insufficient documentation

## 2015-07-02 DIAGNOSIS — J302 Other seasonal allergic rhinitis: Secondary | ICD-10-CM

## 2015-07-02 LAB — COMPLETE METABOLIC PANEL WITH GFR
ALT: 13 U/L (ref 6–29)
AST: 15 U/L (ref 10–30)
Albumin: 4.1 g/dL (ref 3.6–5.1)
Alkaline Phosphatase: 65 U/L (ref 33–115)
BILIRUBIN TOTAL: 0.5 mg/dL (ref 0.2–1.2)
BUN: 4 mg/dL — AB (ref 7–25)
CHLORIDE: 104 mmol/L (ref 98–110)
CO2: 26 mmol/L (ref 20–31)
CREATININE: 0.63 mg/dL (ref 0.50–1.10)
Calcium: 9.3 mg/dL (ref 8.6–10.2)
GFR, Est Non African American: >89 mL/min (ref >=60)
Glucose, Bld: 74 mg/dL (ref 65–99)
Potassium: 3.8 mmol/L (ref 3.5–5.3)
Sodium: 138 mmol/L (ref 135–146)
TOTAL PROTEIN: 6.6 g/dL (ref 6.1–8.1)

## 2015-07-02 LAB — POCT URINE PREGNANCY: PREG TEST UR: NEGATIVE

## 2015-07-02 LAB — LIPID PANEL
Cholesterol: 157 mg/dL (ref 125–200)
HDL: 36 mg/dL — ABNORMAL LOW (ref >=46)
LDL CALC: 109 mg/dL (ref <130)
TRIGLYCERIDES: 60 mg/dL (ref <150)
Total CHOL/HDL Ratio: 4.4 Ratio (ref <=5.0)
VLDL: 12 mg/dL (ref <30)

## 2015-07-02 MED ORDER — TRAMADOL HCL 50 MG PO TABS: 50.0000 mg | ORAL_TABLET | Freq: Two times a day (BID) | ORAL | Status: DC | PRN

## 2015-07-02 NOTE — Patient Instructions (Signed)

## 2015-07-02 NOTE — Progress Notes (Signed)
Patient's here for f/up BP.   Patient c/o lower back pain that radiates to her shoulder blade down the middle of back.   Rate pain at 7/10, describes as sharp, throbbing sensation, off and on.  Patient requesting a pregnancy test. She think there maybe a possibility she's pregnant.

## 2015-07-02 NOTE — Progress Notes (Addendum)
Subjective:  Patient ID: [name] [name], female    DOB: Nov 07, 1985  Age: 30 y.o. MRN: [id number]  CC: Hypertension   HPI [name] [name] is a 30 year old female with a history of hypertension, seasonal allergies, depression, eczema who comes in for follow-up visit and her blood pressure. At the last office visit have blood pressure was elevated due to not taking medications but today it is within normal limits. She remains on topical steroids for her eczema and is currently on an SSRI for depression.  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 Also states her medications for seasonal allergies are not working she constantly has to clear her throat a lot. Would like to have a pregnancy test that she has been feeling nauseous and she is currently on her cycle right now but the blood flows symptoms irregular.   Outpatient Prescriptions Prior to Visit  Medication Sig Dispense Refill  . amLODipine (NORVASC) 10 MG tablet Take 1 tablet (10 mg total) by mouth daily. 30 tablet 2  . cetirizine (ZYRTEC) 10 MG tablet Take 1 tablet (10 mg total) by mouth daily. 30 tablet 2  . levonorgestrel (MIRENA) 20 MCG/24HR IUD 1 Intra Uterine Device (1 each total) by Intrauterine route once. 1 each 0  . montelukast (SINGULAIR) 10 MG tablet Take 1 tablet (10 mg total) by mouth at bedtime. 30 tablet 2  . sertraline (ZOLOFT) 50 MG tablet Take 1 tablet (50 mg total) by mouth daily. 30 tablet 2  . triamcinolone cream (KENALOG) 0.1 % Apply 1 application topically 2 (two) times daily as needed. To affected areas for eczema. 80 g 1  . acetaminophen (TYLENOL) 325 MG tablet Take 325 mg by mouth every 6 (six) hours as needed for moderate pain. Reported on 07/02/2015    . acetaminophen-codeine (TYLENOL #3) 300-30 MG  per tablet Take 2 tablets by mouth every 4 (four) hours as needed for moderate pain. (Patient not taking: Reported on 07/02/2015) 30 tablet 0   No facility-administered medications prior to visit.    ROS Review of Systems  Constitutional: Negative for activity change, appetite change and fatigue.  HENT: Positive for postnasal drip and rhinorrhea. Negative for congestion, sinus pressure and sore throat.   Eyes: Negative for visual disturbance.  Respiratory: Negative for cough, chest tightness, shortness of breath and wheezing.   Cardiovascular: Negative for chest pain and palpitations.  Gastrointestinal: Negative for abdominal pain, constipation and abdominal distention.  Endocrine: Negative for polydipsia.  Genitourinary: Positive for menstrual problem. Negative for dysuria and frequency.  Musculoskeletal:       See history of present illness  Skin: Negative for rash.  Neurological: Negative for tremors, light-headedness and numbness.  Hematological: Does not bruise/bleed easily.  Psychiatric/Behavioral: Negative for behavioral problems and agitation.    Objective:  BP 122/78 mmHg  Pulse 65  Temp(Src) 98.2 F (36.8 C) (Oral)  Resp 16  Ht '5\' 7"'  (1.702 m)  Wt 195 lb (88.451 kg)  BMI 30.53 kg/m2  SpO2 99%  LMP 07/02/2015  BP/Weight 07/02/2015 06/01/2015 09/07/252  Systolic BP 270 623 762  Diastolic BP 78 831 80  Wt. (Lbs) 195 201 221.38  BMI 30.53 31.47 33.67      Physical Exam  Constitutional: She is oriented to person, place, and time. She appears  well-developed and well-nourished.  Cardiovascular: Normal rate, normal heart sounds and intact distal pulses.   No murmur heard. Pulmonary/Chest: Effort normal and breath sounds normal. She has no wheezes. She has no rales. She exhibits no tenderness.  Abdominal: Soft. Bowel sounds are normal. She exhibits no distension and no mass. There is no tenderness.  Musculoskeletal: Normal range of motion. She exhibits tenderness (  mild tenderness in between shoulder blades.).  Neurological: She is alert and oriented to person, place, and time.  Skin:  Eczematous lesions on flexor aspect of feet and antecubital fossa     Assessment & Plan:   1. Essential hypertension  controlled - COMPLETE METABOLIC PANEL WITH GFR - Lipid panel  2. Tobacco abuse  spent 3 minutes discussing cessation and she is not ready to quit  3. 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  Dispense: 30 tablet; Refill: 1  4. Irregular menstrual cycle  we'll send off urine pregnancy test as per patient request - POCT urine pregnancy  5. Seasonal allergies  uncontrolled on current remedies however she cannot use Sudafed due to her occasionally her blood pressure  6. Eczema  stable on topical steroids.   Meds ordered this encounter  Medications  . traMADol (ULTRAM) 50 MG tablet    Sig: Take 1 tablet (50 mg total) by mouth every 12 (twelve) hours as needed.    Dispense:  30 tablet    Refill:  1    Follow-up: Return in about 3 months (around 09/30/2015) for Follow-up on Hypertension.   Arnoldo Morale MD

## 2015-07-03 NOTE — Addendum Note (Signed)
Addended by: Arnoldo Morale on: 07/03/2015 11:25 PM   Modules accepted: Miquel Dunn

## 2015-07-06 ENCOUNTER — Telehealth: Payer: Self-pay

## 2015-07-06 NOTE — Telephone Encounter (Signed)
CMA called patient, patient did not answer. Left a message for a return call.

## 2015-07-06 NOTE — Telephone Encounter (Signed)
-----   Message from Arnoldo Morale, MD sent at 07/03/2015 11:25 PM EST ----- Please inform the patient that labs are normal. Thank you.

## 2015-07-07 ENCOUNTER — Telehealth: Payer: Self-pay

## 2015-07-07 NOTE — Telephone Encounter (Signed)
-----   Message from Arnoldo Morale, MD sent at 07/03/2015 11:25 PM EST ----- Please inform the patient that labs are normal. Thank you.

## 2015-07-07 NOTE — Telephone Encounter (Signed)
CMA called patient, patient verified name and DOB. Patient was given lab results, verbalized she understood with no further questions.

## 2015-09-07 ENCOUNTER — Other Ambulatory Visit: Payer: Self-pay | Admitting: Family Medicine

## 2015-09-07 DIAGNOSIS — M546 Pain in thoracic spine: Secondary | ICD-10-CM

## 2015-09-07 NOTE — Telephone Encounter (Signed)
Pt. Called requesting a med refill on all her current medications. Please f/u

## 2015-09-08 NOTE — Telephone Encounter (Signed)
Pt. Called requesting a med refill on Tramadol. Please f/u

## 2015-09-13 DIAGNOSIS — M546 Pain in thoracic spine: Secondary | ICD-10-CM

## 2015-09-13 HISTORY — DX: Pain in thoracic spine: M54.6

## 2015-09-13 NOTE — Telephone Encounter (Signed)
Please call patient  Tramadol ready for pick up

## 2015-09-13 NOTE — Telephone Encounter (Signed)
lrft message that Rx ready for patient to pick up at the front desk

## 2015-09-27 ENCOUNTER — Encounter: Payer: Self-pay | Admitting: Family Medicine

## 2015-09-27 ENCOUNTER — Ambulatory Visit: Payer: Medicaid Other | Attending: Family Medicine | Admitting: Family Medicine

## 2015-09-27 VITALS — BP 132/96 | HR 64 | Temp 97.7°F | Resp 16 | Ht 67.0 in | Wt 196.6 lb

## 2015-09-27 DIAGNOSIS — R109 Unspecified abdominal pain: Secondary | ICD-10-CM | POA: Insufficient documentation

## 2015-09-27 DIAGNOSIS — Z79899 Other long term (current) drug therapy: Secondary | ICD-10-CM | POA: Diagnosis not present

## 2015-09-27 DIAGNOSIS — Z30013 Encounter for initial prescription of injectable contraceptive: Secondary | ICD-10-CM | POA: Diagnosis not present

## 2015-09-27 DIAGNOSIS — Z30432 Encounter for removal of intrauterine contraceptive device: Secondary | ICD-10-CM | POA: Diagnosis not present

## 2015-09-27 DIAGNOSIS — I10 Essential (primary) hypertension: Secondary | ICD-10-CM | POA: Insufficient documentation

## 2015-09-27 DIAGNOSIS — M546 Pain in thoracic spine: Secondary | ICD-10-CM | POA: Insufficient documentation

## 2015-09-27 MED ORDER — CYCLOBENZAPRINE HCL 10 MG PO TABS: 10.0000 mg | ORAL_TABLET | Freq: Three times a day (TID) | ORAL | Status: DC | PRN

## 2015-09-27 NOTE — Patient Instructions (Signed)

## 2015-09-27 NOTE — Progress Notes (Signed)
Subjective:  Patient ID: Ramonita Lab, female    DOB: 1986/05/25  Age: 30 y.o. MRN: [id number]  CC: Contraception   HPI [place] . 30 year old female with a history of hypertension, thoracic back pain, with IUD (Mirena) in situ since 11/2014 who comes into the clinic complaining of worsening back pain ever since she had her IUD placed. Also complains of abdominal pain and dyspareunia. She would like to have IUD removed.  She was placed on tramadol for thoracic back pain which she says does not help and she would like to have a muscle relaxant as well. Pain does not radiate to Extremities and is not exacerbated by movement.  She would also like to discuss contraceptive measures. Blood pressure is elevated and she endorses compliance with her antihypertensive but states she took her blood pressure medication just as she was leaving for her appointment here at the clinic.  Outpatient Prescriptions Prior to Visit  Medication Sig Dispense Refill  . amLODipine (NORVASC) 10 MG tablet TAKE ONE TABLET BY MOUTH ONCE DAILY 30 tablet 2  . cetirizine (ZYRTEC) 10 MG tablet TAKE ONE TABLET BY MOUTH ONCE DAILY 30 tablet 2  . levonorgestrel (MIRENA) 20 MCG/24HR IUD 1 Intra Uterine Device (1 each total) by Intrauterine route once. 1 each 0  . montelukast (SINGULAIR) 10 MG tablet TAKE ONE TABLET BY MOUTH AT BEDTIME 30 tablet 2  . sertraline (ZOLOFT) 50 MG tablet TAKE ONE TABLET BY MOUTH ONCE DAILY 30 tablet 2  . traMADol (ULTRAM) 50 MG tablet TAKE ONE TABLET BY MOUTH EVERY 12 HOURS AS NEEDED 30 tablet 0  . triamcinolone cream (KENALOG) 0.1 % Apply 1 application topically 2 (two) times daily as needed. To affected areas for eczema. 80 g 1  . acetaminophen (TYLENOL) 325 MG tablet Take 325 mg by mouth every 6 (six) hours as needed for moderate pain. Reported on 09/27/2015     No facility-administered medications prior to visit.    ROS Review of Systems  Constitutional: Negative for activity  change, appetite change and fatigue.  HENT: Negative for congestion, sinus pressure and sore throat.   Eyes: Negative for visual disturbance.  Respiratory: Negative for cough, chest tightness, shortness of breath and wheezing.   Cardiovascular: Negative for chest pain and palpitations.  Gastrointestinal: Positive for abdominal pain. Negative for constipation and abdominal distention.  Endocrine: Negative for polydipsia.  Genitourinary: Negative for dysuria and frequency.  Musculoskeletal: Positive for back pain. Negative for arthralgias.  Skin: Negative for rash.  Neurological: Negative for tremors, light-headedness and numbness.  Hematological: Does not bruise/bleed easily.  Psychiatric/Behavioral: Negative for behavioral problems and agitation.    Objective:  BP 132/96 mmHg  Pulse 64  Temp(Src) 97.7 F (36.5 C) (Oral)  Resp 16  Ht 5\' 7"  (1.702 m)  Wt 196 lb 9.6 oz (89.177 kg)  BMI 30.78 kg/m2  SpO2 100%  LMP 08/29/2015  BP/Weight 09/27/2015 07/02/2015 0000000  Systolic BP Q000111Q [id number] Q000111Q  Diastolic BP 96 78 [id number]  Wt. (Lbs) 196.6 195 201  BMI 30.78 30.53 31.47      Physical Exam  Constitutional: She is oriented to person, place, and time. She appears well-developed and well-nourished.  Cardiovascular: Normal rate, normal heart sounds and intact distal pulses.   No murmur heard. Pulmonary/Chest: Effort normal and breath sounds normal. She has no wheezes. She has no rales. She exhibits no tenderness.  Abdominal: Soft. Bowel sounds are normal. She exhibits no distension and no mass. There is no tenderness.  Genitourinary:  External genitalia-normal Mirena string visible through cervical os  Musculoskeletal: Normal range of motion.  Neurological: She is alert and oriented to person, place, and time.     Assessment & Plan:   1. Essential hypertension Initial blood pressure was elevated but repeat was back to normal. Continue amlodipine  2. Bilateral thoracic back  pain Left exclude muscle spasm at this time. Continue tramadol - cyclobenzaprine (FLEXERIL) 10 MG tablet; Take 1 tablet (10 mg total) by mouth 3 (three) times daily as needed for muscle spasms.  Dispense: 60 tablet; Refill: 1  3. Encounter for IUD removal Patient placed in lithotomy position,. Retrieved by means of ring forceps ,Patient tolerated the procedure well Mirena sent for culture.  Advised to use backup contraception until she commences her depo shots  4. Encounter for initial prescription of injectable contraceptive Discussed contraceptive options and patient has opted to go with Depo Provera  Placed on Depo-Provera-side effects discussed    Meds ordered this encounter  Medications  . cyclobenzaprine (FLEXERIL) 10 MG tablet    Sig: Take 1 tablet (10 mg total) by mouth 3 (three) times daily as needed for muscle spasms.    Dispense:  60 tablet    Refill:  1    Follow-up: Return in about 6 weeks (around 11/08/2015) for  Follow-up of back pain.   Arnoldo Morale MD

## 2015-09-27 NOTE — Progress Notes (Signed)
Patient's here for IUD problems and lower back pain. Patient requesting another contraceptive method.  Patient reports that her back pain is getting worse since the IUD insertion.

## 2015-10-12 LAB — WOUND CULTURE

## 2016-01-18 ENCOUNTER — Other Ambulatory Visit: Payer: Self-pay | Admitting: Pharmacist

## 2016-01-18 MED ORDER — AMLODIPINE BESYLATE 10 MG PO TABS: 10.0000 mg | ORAL_TABLET | Freq: Every day | ORAL | 2 refills | Status: DC

## 2016-01-18 MED ORDER — CETIRIZINE HCL 10 MG PO TABS: 10.0000 mg | ORAL_TABLET | Freq: Every day | ORAL | 2 refills | Status: DC

## 2016-05-21 ENCOUNTER — Encounter (HOSPITAL_COMMUNITY): Payer: Self-pay | Admitting: *Deleted

## 2016-05-21 ENCOUNTER — Inpatient Hospital Stay (HOSPITAL_COMMUNITY)
Admission: AD | Admit: 2016-05-21 | Discharge: 2016-05-21 | Disposition: A | Discharge status: Home / Self Care | Payer: Medicaid Other | Source: Ambulatory Visit | Attending: Obstetrics & Gynecology | Admitting: Obstetrics & Gynecology

## 2016-05-21 DIAGNOSIS — F17210 Nicotine dependence, cigarettes, uncomplicated: Secondary | ICD-10-CM | POA: Diagnosis not present

## 2016-05-21 DIAGNOSIS — O210 Mild hyperemesis gravidarum: Secondary | ICD-10-CM | POA: Diagnosis not present

## 2016-05-21 DIAGNOSIS — O99331 Smoking (tobacco) complicating pregnancy, first trimester: Secondary | ICD-10-CM | POA: Diagnosis not present

## 2016-05-21 DIAGNOSIS — Z3A09 9 weeks gestation of pregnancy: Secondary | ICD-10-CM | POA: Diagnosis not present

## 2016-05-21 DIAGNOSIS — O219 Vomiting of pregnancy, unspecified: Secondary | ICD-10-CM | POA: Diagnosis not present

## 2016-05-21 DIAGNOSIS — O0991 Supervision of high risk pregnancy, unspecified, first trimester: Secondary | ICD-10-CM

## 2016-05-21 LAB — POCT PREGNANCY, URINE: PREG TEST UR: POSITIVE — AB

## 2016-05-21 LAB — URINE MICROSCOPIC-ADD ON: RBC / HPF: NONE SEEN RBC/hpf (ref 0–5)

## 2016-05-21 LAB — URINALYSIS, ROUTINE W REFLEX MICROSCOPIC
BILIRUBIN URINE: NEGATIVE
Glucose, UA: NEGATIVE mg/dL
Hgb urine dipstick: NEGATIVE
KETONES UR: NEGATIVE mg/dL
NITRITE: NEGATIVE
Protein, ur: 30 mg/dL — AB
Specific Gravity, Urine: 1.025 (ref 1.005–1.030)
pH: 6 (ref 5.0–8.0)

## 2016-05-21 MED ORDER — LABETALOL HCL 200 MG PO TABS: 100.0000 mg | ORAL_TABLET | Freq: Two times a day (BID) | ORAL | 1 refills | Status: DC

## 2016-05-21 MED ORDER — PROMETHAZINE HCL 25 MG PO TABS: 25.0000 mg | ORAL_TABLET | Freq: Four times a day (QID) | ORAL | 0 refills | Status: DC | PRN

## 2016-05-21 NOTE — MAU Provider Note (Signed)
History     CSN: [id number]  Arrival date and time: 05/21/16 1215   First Provider Initiated Contact with Patient 05/21/16 1334      Chief Complaint  Patient presents with  . Possible Pregnancy   HPI  Jada Mayabb is a 30 y.o. G3P2002 at [redacted]w[redacted]d. She presents with  Vomiting x 1-2 wks. She vomits daily between 11a-1p. She denies changes in discharge, odor or itching. No UTI S&S. No bleeding or cramping, occ back pain and gas pains. She plans [facility] in [place]- last 2 pregnancies there, gets great care.  OB History    Gravida Para Term Preterm AB Living   3 2 2     2    SAB TAB Ectopic Multiple Live Births         0 2      Past Medical History:  Diagnosis Date  . Asthma, allergic    uses inhaler prn - infrequently  . Eczema   . Hypertension   . Lactose intolerance   . Seasonal allergies     Past Surgical History:  Procedure Laterality Date  . WISDOM TOOTH EXTRACTION      Family History  Problem Relation Age of Onset  . Adopted: Yes  . Hypertension Father   . Hypertension Maternal Aunt   . Hypertension Maternal Uncle   . Hypertension Maternal Grandmother   . Eczema Maternal Grandmother   . Hypertension Maternal Grandfather   . Hypertension Paternal Grandmother   . Hypertension Mother   . Eczema Mother     Social History  Substance Use Topics  . Smoking status: Current Every Day Smoker    Packs/day: 0.25    Types: Cigarettes  . Smokeless tobacco: Former Systems developer  . Alcohol use No    Allergies:  Allergies  Allergen Reactions  . Sulfa Antibiotics Swelling    Causes swelling on the face.  . Shellfish Allergy Other (See Comments)    Stomach upset    Prescriptions Prior to Admission  Medication Sig Dispense Refill Last Dose  . acetaminophen (TYLENOL) 325 MG tablet Take 325 mg by mouth every 6 (six) hours as needed for moderate pain. Reported on 09/27/2015   Not Taking  . amLODipine (NORVASC) 10 MG tablet Take 1 tablet (10 mg total) by mouth daily. 30  tablet 2   . cetirizine (ZYRTEC) 10 MG tablet Take 1 tablet (10 mg total) by mouth daily. 30 tablet 2   . cyclobenzaprine (FLEXERIL) 10 MG tablet Take 1 tablet (10 mg total) by mouth 3 (three) times daily as needed for muscle spasms. 60 tablet 1   . levonorgestrel (MIRENA) 20 MCG/24HR IUD 1 Intra Uterine Device (1 each total) by Intrauterine route once. 1 each 0 Taking  . montelukast (SINGULAIR) 10 MG tablet TAKE ONE TABLET BY MOUTH AT BEDTIME 30 tablet 2 Taking  . sertraline (ZOLOFT) 50 MG tablet TAKE ONE TABLET BY MOUTH ONCE DAILY 30 tablet 2 Taking  . traMADol (ULTRAM) 50 MG tablet TAKE ONE TABLET BY MOUTH EVERY 12 HOURS AS NEEDED 30 tablet 0 Taking  . triamcinolone cream (KENALOG) 0.1 % Apply 1 application topically 2 (two) times daily as needed. To affected areas for eczema. 80 g 1 Taking    Review of Systems  Constitutional: Positive for malaise/fatigue. Negative for chills and fever.  Gastrointestinal: Positive for vomiting. Negative for abdominal pain.  Genitourinary: Negative.    Physical Exam   Blood pressure 134/80, pulse 87, temperature 98.3 F (36.8 C), temperature source Oral, resp.  rate 18, last menstrual period 03/16/2016, unknown if currently breastfeeding.  Physical Exam  Nursing note and vitals reviewed. Constitutional: She is oriented to person, place, and time. She appears well-developed and well-nourished.  Musculoskeletal: Normal range of motion.  Neurological: She is alert and oriented to person, place, and time.  Skin: Skin is warm and dry.  Psychiatric: She has a normal mood and affect. Her behavior is normal.    MAU Course  Procedures  MDM UPT- pos UA- neg  Assessment and Plan  9 3/[redacted] wks EGA by dates Behavioral changes for N&V reviewed Phenergan for vomiting Preg verification letter to pt Call to make an appt for [facility] Precautions reviewed  Joycelyn Rua M. 05/21/2016, 1:34 PM

## 2016-05-21 NOTE — MAU Note (Addendum)
Pt C/O N&V for the last week, usually vomits once a day, states her period is late, did HPT - was inconclusive.  Denies abd pain or bleeding, has occasional back pain.

## 2016-06-01 ENCOUNTER — Encounter: Payer: Self-pay | Admitting: Obstetrics and Gynecology

## 2016-06-07 ENCOUNTER — Other Ambulatory Visit: Payer: Self-pay | Admitting: Family Medicine

## 2016-06-14 ENCOUNTER — Ambulatory Visit (HOSPITAL_COMMUNITY)
Admission: RE | Admit: 2016-06-14 | Discharge: 2016-06-14 | Disposition: A | Discharge status: Home / Self Care | Payer: Medicaid Other | Source: Ambulatory Visit | Attending: Obstetrics and Gynecology | Admitting: Obstetrics and Gynecology

## 2016-06-14 ENCOUNTER — Encounter (HOSPITAL_COMMUNITY): Payer: Self-pay

## 2016-06-14 ENCOUNTER — Other Ambulatory Visit (HOSPITAL_COMMUNITY): Payer: Self-pay | Admitting: Obstetrics and Gynecology

## 2016-06-14 DIAGNOSIS — O10012 Pre-existing essential hypertension complicating pregnancy, second trimester: Secondary | ICD-10-CM | POA: Insufficient documentation

## 2016-06-14 DIAGNOSIS — Z3A12 12 weeks gestation of pregnancy: Secondary | ICD-10-CM | POA: Insufficient documentation

## 2016-06-14 DIAGNOSIS — O0991 Supervision of high risk pregnancy, unspecified, first trimester: Secondary | ICD-10-CM

## 2016-06-14 DIAGNOSIS — O10011 Pre-existing essential hypertension complicating pregnancy, first trimester: Secondary | ICD-10-CM

## 2016-06-14 DIAGNOSIS — Z3682 Encounter for antenatal screening for nuchal translucency: Secondary | ICD-10-CM | POA: Diagnosis present

## 2016-06-19 NOTE — L&D Delivery Note (Signed)
31 y.o. [id number] at [redacted]w[redacted]d delivered a viable female infant in cephalic, LOA position. nuchal cord x1, easily reduced.  anterior shoulder delivered with ease. 60 sec delayed cord clamping. Cord clamped x2 and cut. Placenta delivered spontaneously intact, with 3VC. Fundus firm on exam with massage and pitocin. Good hemostasis noted.  Anesthesia: Epidural Laceration: none Good hemostasis noted. EBL: 300 cc  Mom and baby recovering in LDR.    Apgars: APGAR (1 MIN):  8 APGAR (5 MINS):  9 Weight: Pending skin to skin  Sponge and instrument count were correct x2. Placenta sent to L&D  Everrett Coombe, MD PGY-1 Family Medicine 12/15/2016, 12:39 PM  I confirm that I have verified the information documented in the resident's note and that I have also personally reperformed the physical exam and all medical decision making activities. I was gloved and present for the delivery in its entirety, and I agree with the above resident's note.    Laury Deep, CNM 12/15/2016 3:52 PM

## 2016-06-20 ENCOUNTER — Encounter: Payer: Medicaid Other | Admitting: Advanced Practice Midwife

## 2016-06-20 ENCOUNTER — Other Ambulatory Visit: Payer: Self-pay | Admitting: *Deleted

## 2016-06-22 ENCOUNTER — Encounter: Payer: Self-pay | Admitting: Family Medicine

## 2016-06-22 ENCOUNTER — Other Ambulatory Visit (HOSPITAL_COMMUNITY)
Admission: RE | Admit: 2016-06-22 | Discharge: 2016-06-22 | Disposition: A | Discharge status: Home / Self Care | Payer: Medicaid Other | Source: Ambulatory Visit | Attending: Family Medicine | Admitting: Family Medicine

## 2016-06-22 ENCOUNTER — Ambulatory Visit (INDEPENDENT_AMBULATORY_CARE_PROVIDER_SITE_OTHER): Payer: Medicaid Other | Admitting: Family Medicine

## 2016-06-22 VITALS — BP 133/80 | HR 82 | Wt 189.2 lb

## 2016-06-22 DIAGNOSIS — Z23 Encounter for immunization: Secondary | ICD-10-CM

## 2016-06-22 DIAGNOSIS — L2084 Intrinsic (allergic) eczema: Secondary | ICD-10-CM

## 2016-06-22 DIAGNOSIS — Z113 Encounter for screening for infections with a predominantly sexual mode of transmission: Secondary | ICD-10-CM | POA: Diagnosis present

## 2016-06-22 DIAGNOSIS — J302 Other seasonal allergic rhinitis: Secondary | ICD-10-CM

## 2016-06-22 DIAGNOSIS — O10019 Pre-existing essential hypertension complicating pregnancy, unspecified trimester: Secondary | ICD-10-CM

## 2016-06-22 DIAGNOSIS — O10012 Pre-existing essential hypertension complicating pregnancy, second trimester: Secondary | ICD-10-CM

## 2016-06-22 DIAGNOSIS — O0992 Supervision of high risk pregnancy, unspecified, second trimester: Secondary | ICD-10-CM | POA: Insufficient documentation

## 2016-06-22 LAB — POCT URINALYSIS DIP (DEVICE)
Bilirubin Urine: NEGATIVE
GLUCOSE, UA: NEGATIVE mg/dL
Ketones, ur: NEGATIVE mg/dL
Nitrite: NEGATIVE
PROTEIN: NEGATIVE mg/dL
SPECIFIC GRAVITY, URINE: 1.02 (ref 1.005–1.030)
UROBILINOGEN UA: 1 mg/dL (ref 0.0–1.0)
pH: 6 (ref 5.0–8.0)

## 2016-06-22 MED ORDER — ASPIRIN EC 81 MG PO TBEC: 81.0000 mg | DELAYED_RELEASE_TABLET | Freq: Every day | ORAL | 3 refills | Status: DC

## 2016-06-22 MED ORDER — TRIAMCINOLONE ACETONIDE 55 MCG/ACT NA AERO: 2.0000 | INHALATION_SPRAY | Freq: Every day | NASAL | 12 refills | Status: DC

## 2016-06-22 MED ORDER — LABETALOL HCL 200 MG PO TABS: 100.0000 mg | ORAL_TABLET | Freq: Two times a day (BID) | ORAL | 1 refills | Status: DC

## 2016-06-22 MED ORDER — TRIAMCINOLONE ACETONIDE 0.1 % EX CREA: 1.0000 "application " | TOPICAL_CREAM | Freq: Two times a day (BID) | CUTANEOUS | 1 refills | Status: DC | PRN

## 2016-06-22 NOTE — Progress Notes (Signed)
Subjective:    [name] [name] is a [id number] [redacted]w[redacted]d being seen today for her first obstetrical visit.  Her obstetrical history is significant for pregnancy induced hypertension. Patient does intend to breast feed. Pregnancy history fully reviewed.  Patient reports no complaints.  Vitals:   06/22/16 1446  BP: 133/80  Pulse: 82  Weight: 189 lb 3.2 oz (85.8 kg)    HISTORY: OB History  Gravida Para Term Preterm AB Living  3 2 2     2   SAB TAB Ectopic Multiple Live Births        0 2    # Outcome Date GA Lbr Len/2nd Weight Sex Delivery Anes PTL Lv  3 Current           2 Term 11/15/14 [redacted]w[redacted]d 03:42 / 00:07 5 lb 15.1 oz (2.696 kg) F Vag-Spont EPI  LIV  1 Term 05/27/13 [redacted]w[redacted]d 05:01 / 06:04 8 lb 8 oz (3.855 kg) F Vag-Spont EPI, Local  LIV     Birth Comments: WNL     Past Medical History:  Diagnosis Date  . Asthma, allergic    uses inhaler prn - infrequently  . Eczema   . Hypertension   . Lactose intolerance   . Seasonal allergies    Past Surgical History:  Procedure Laterality Date  . WISDOM TOOTH EXTRACTION     Family History  Problem Relation Age of Onset  . Adopted: Yes  . Hypertension Father   . Hypertension Maternal Aunt   . Hypertension Maternal Uncle   . Hypertension Maternal Grandmother   . Eczema Maternal Grandmother   . Hypertension Maternal Grandfather   . Hypertension Paternal Grandmother   . Hypertension Mother   . Eczema Mother      Exam    Uterus:     Pelvic Exam:   System: Breast:  normal appearance, no masses or tenderness   Skin: normal coloration and turgor, no rashes    Neurologic: oriented   Extremities: normal strength, tone, and muscle mass   HEENT sclera clear, anicteric   Mouth/Teeth mucous membranes moist, pharynx normal without lesions   Neck supple   Cardiovascular: regular rate and rhythm, no murmurs or gallops   Respiratory:  appears well, vitals normal, no respiratory distress, acyanotic, normal RR, ear and throat exam is  normal, neck free of mass or lymphadenopathy, chest clear, no wheezing, crepitations, rhonchi, normal symmetric air entry   Abdomen: soft, non-tender; bowel sounds normal; no masses,  no organomegaly      Assessment:    Pregnancy: G3P2002 1. Supervision of high risk pregnancy in second trimester Begin PNC - Prenatal Profile - Hemoglobin A1c - Culture, OB Urine - GC/Chlamydia probe amp ([name])not at [facility] - [name] MFM OB COMP + 14 WK; Future  2. Needs flu shot - Flu Vaccine QUAD 36+ mos IM (Fluarix, Quad PF)  3. Hypertension in pregnancy, essential, antepartum Baseline labs - Protein / creatinine ratio, urine - Comprehensive metabolic panel - aspirin EC 81 MG tablet; Take 1 tablet (81 mg total) by mouth daily.  Dispense: 90 tablet; Refill: 3 - labetalol (NORMODYNE) 200 MG tablet; Take 0.5 tablets (100 mg total) by mouth 2 (two) times daily.  Dispense: 60 tablet; Refill: 1  4. Intrinsic eczema Continue Triamcinalone - triamcinolone cream (KENALOG) 0.1 %; Apply 1 application topically 2 (two) times daily as needed. To affected areas for eczema.  Dispense: 80 g; Refill: 1  5. Chronic seasonal allergic rhinitis, unspecified trigger - triamcinolone (NASACORT AQ) 55  MCG/ACT AERO nasal inhaler; Place 2 sprays into the nose daily.  Dispense: 1 Inhaler; Refill: [name] 06/22/2016

## 2016-06-22 NOTE — Patient Instructions (Signed)
Second Trimester of Pregnancy The second trimester is from week 13 through week 28 (months 4 through 6). The second trimester is often a time when you feel your best. Your body has also adjusted to being pregnant, and you begin to feel better physically. Usually, morning sickness has lessened or quit completely, you may have more energy, and you may have an increase in appetite. The second trimester is also a time when the fetus is growing rapidly. At the end of the sixth month, the fetus is about 9 inches long and weighs about 1 pounds. You will likely begin to feel the baby move (quickening) between 18 and 20 weeks of the pregnancy. Body changes during your second trimester Your body continues to go through many changes during your second trimester. The changes vary from woman to woman.  Your weight will continue to increase. You will notice your lower abdomen bulging out.  You may begin to get stretch marks on your hips, abdomen, and breasts.  You may develop headaches that can be relieved by medicines. The medicines should be approved by your health care provider.  You may urinate more often because the fetus is pressing on your bladder.  You may develop or continue to have heartburn as a result of your pregnancy.  You may develop constipation because certain hormones are causing the muscles that push waste through your intestines to slow down.  You may develop hemorrhoids or swollen, bulging veins (varicose veins).  You may have back pain. This is caused by:  Weight gain.  Pregnancy hormones that are relaxing the joints in your pelvis.  A shift in weight and the muscles that support your balance.  Your breasts will continue to grow and they will continue to become tender.  Your gums may bleed and may be sensitive to brushing and flossing.  Dark spots or blotches (chloasma, mask of pregnancy) may develop on your face. This will likely fade after the baby is born.  A dark line  from your belly button to the pubic area (linea nigra) may appear. This will likely fade after the baby is born.  You may have changes in your hair. These can include thickening of your hair, rapid growth, and changes in texture. Some women also have hair loss during or after pregnancy, or hair that feels dry or thin. Your hair will most likely return to normal after your baby is born. What to expect at prenatal visits During a routine prenatal visit:  You will be weighed to make sure you and the fetus are growing normally.  Your blood pressure will be taken.  Your abdomen will be measured to track your baby's growth.  The fetal heartbeat will be listened to.  Any test results from the previous visit will be discussed. Your health care provider may ask you:  How you are feeling.  If you are feeling the baby move.  If you have had any abnormal symptoms, such as leaking fluid, bleeding, severe headaches, or abdominal cramping.  If you are using any tobacco products, including cigarettes, chewing tobacco, and electronic cigarettes.  If you have any questions. Other tests that may be performed during your second trimester include:  Blood tests that check for:  Low iron levels (anemia).  Gestational diabetes (between 24 and 28 weeks).  Rh antibodies. This is to check for a protein on red blood cells (Rh factor).  Urine tests to check for infections, diabetes, or protein in the urine.  An ultrasound to  confirm the proper growth and development of the baby.  An amniocentesis to check for possible genetic problems.  Fetal screens for spina bifida and Down syndrome.  HIV (human immunodeficiency virus) testing. Routine prenatal testing includes screening for HIV, unless you choose not to have this test. Follow these instructions at home: Eating and drinking  Continue to eat regular, healthy meals.  Avoid raw meat, uncooked cheese, cat litter boxes, and soil used by cats. These  carry germs that can cause birth defects in the baby.  Take your prenatal vitamins.  Take 1500-2000 mg of calcium daily starting at the 20th week of pregnancy until you deliver your baby.  If you develop constipation:  Take over-the-counter or prescription medicines.  Drink enough fluid to keep your urine clear or pale yellow.  Eat foods that are high in fiber, such as fresh fruits and vegetables, whole grains, and beans.  Limit foods that are high in fat and processed sugars, such as fried and sweet foods. Activity  Exercise only as directed by your health care provider. Experiencing uterine cramps is a good sign to stop exercising.  Avoid heavy lifting, wear low heel shoes, and practice good posture.  Wear your seat belt at all times when driving.  Rest with your legs elevated if you have leg cramps or low back pain.  Wear a good support bra for breast tenderness.  Do not use hot tubs, steam rooms, or saunas. Lifestyle  Avoid all smoking, herbs, alcohol, and unprescribed drugs. These chemicals affect the formation and growth of the baby.  Do not use any products that contain nicotine or tobacco, such as cigarettes and e-cigarettes. If you need help quitting, ask your health care provider.  A sexual relationship may be continued unless your health care provider directs you otherwise. General instructions  Follow your health care provider's instructions regarding medicine use. There are medicines that are either safe or unsafe to take during pregnancy.  Take warm sitz baths to soothe any pain or discomfort caused by hemorrhoids. Use hemorrhoid cream if your health care provider approves.  If you develop varicose veins, wear support hose. Elevate your feet for 15 minutes, 3-4 times a day. Limit salt in your diet.  Visit your dentist if you have not gone yet during your pregnancy. Use a soft toothbrush to brush your teeth and be gentle when you floss.  Keep all follow-up  prenatal visits as told by your health care provider. This is important. Contact a health care provider if:  You have dizziness.  You have mild pelvic cramps, pelvic pressure, or nagging pain in the abdominal area.  You have persistent nausea, vomiting, or diarrhea.  You have a bad smelling vaginal discharge.  You have pain with urination. Get help right away if:  You have a fever.  You are leaking fluid from your vagina.  You have spotting or bleeding from your vagina.  You have severe abdominal cramping or pain.  You have rapid weight gain or weight loss.  You have shortness of breath with chest pain.  You notice sudden or extreme swelling of your face, hands, ankles, feet, or legs.  You have not felt your baby move in over an hour.  You have severe headaches that do not go away with medicine.  You have vision changes. Summary  The second trimester is from week 13 through week 28 (months 4 through 6). It is also a time when the fetus is growing rapidly.  Your body goes  through many changes during pregnancy. The changes vary from woman to woman.  Avoid all smoking, herbs, alcohol, and unprescribed drugs. These chemicals affect the formation and growth your baby.  Do not use any tobacco products, such as cigarettes, chewing tobacco, and e-cigarettes. If you need help quitting, ask your health care provider.  Contact your health care provider if you have any questions. Keep all prenatal visits as told by your health care provider. This is important. This information is not intended to replace advice given to you by your health care provider. Make sure you discuss any questions you have with your health care provider. Document Released: 05/30/2001 Document Revised: 11/11/2015 Document Reviewed: 08/06/2012 Elsevier Interactive Patient Education  2017 [employer].   Breastfeeding Deciding to breastfeed is one of the best choices you can make for you and your baby. A  change in hormones during pregnancy causes your breast tissue to grow and increases the number and size of your milk ducts. These hormones also allow proteins, sugars, and fats from your blood supply to make breast milk in your milk-producing glands. Hormones prevent breast milk from being released before your baby is born as well as prompt milk flow after birth. Once breastfeeding has begun, thoughts of your baby, as well as his or her sucking or crying, can stimulate the release of milk from your milk-producing glands. Benefits of breastfeeding For Your Baby  Your first milk (colostrum) helps your baby's digestive system function better.  There are antibodies in your milk that help your baby fight off infections.  Your baby has a lower incidence of asthma, allergies, and sudden infant death syndrome.  The nutrients in breast milk are better for your baby than infant formulas and are designed uniquely for your baby's needs.  Breast milk improves your baby's brain development.  Your baby is less likely to develop other conditions, such as childhood obesity, asthma, or type 2 diabetes mellitus. For You  Breastfeeding helps to create a very special bond between you and your baby.  Breastfeeding is convenient. Breast milk is always available at the correct temperature and costs nothing.  Breastfeeding helps to burn calories and helps you lose the weight gained during pregnancy.  Breastfeeding makes your uterus contract to its prepregnancy size faster and slows bleeding (lochia) after you give birth.  Breastfeeding helps to lower your risk of developing type 2 diabetes mellitus, osteoporosis, and breast or ovarian cancer later in life. Signs that your baby is hungry Early Signs of Hunger  Increased alertness or activity.  Stretching.  Movement of the head from side to side.  Movement of the head and opening of the mouth when the corner of the mouth or cheek is stroked  (rooting).  Increased sucking sounds, smacking lips, cooing, sighing, or squeaking.  Hand-to-mouth movements.  Increased sucking of fingers or hands. Late Signs of Hunger  Fussing.  Intermittent crying. Extreme Signs of Hunger  Signs of extreme hunger will require calming and consoling before your baby will be able to breastfeed successfully. Do not wait for the following signs of extreme hunger to occur before you initiate breastfeeding:  Restlessness.  A loud, strong cry.  Screaming. Breastfeeding basics  Breastfeeding Initiation  Find a comfortable place to sit or lie down, with your neck and back well supported.  Place a pillow or rolled up blanket under your baby to bring him or her to the level of your breast (if you are seated). Nursing pillows are specially designed to help  support your arms and your baby while you breastfeed.  Make sure that your baby's abdomen is facing your abdomen.  Gently massage your breast. With your fingertips, massage from your chest wall toward your nipple in a circular motion. This encourages milk flow. You may need to continue this action during the feeding if your milk flows slowly.  Support your breast with 4 fingers underneath and your thumb above your nipple. Make sure your fingers are well away from your nipple and your baby's mouth.  Stroke your baby's lips gently with your finger or nipple.  When your baby's mouth is open wide enough, quickly bring your baby to your breast, placing your entire nipple and as much of the colored area around your nipple (areola) as possible into your baby's mouth.  More areola should be visible above your baby's upper lip than below the lower lip.  Your baby's tongue should be between his or her lower gum and your breast.  Ensure that your baby's mouth is correctly positioned around your nipple (latched). Your baby's lips should create a seal on your breast and be turned out (everted).  It is common  for your baby to suck about 2-3 minutes in order to start the flow of breast milk. Latching  Teaching your baby how to latch on to your breast properly is very important. An improper latch can cause nipple pain and decreased milk supply for you and poor weight gain in your baby. Also, if your baby is not latched onto your nipple properly, he or she may swallow some air during feeding. This can make your baby fussy. Burping your baby when you switch breasts during the feeding can help to get rid of the air. However, teaching your baby to latch on properly is still the best way to prevent fussiness from swallowing air while breastfeeding. Signs that your baby has successfully latched on to your nipple:  Silent tugging or silent sucking, without causing you pain.  Swallowing heard between every 3-4 sucks.  Muscle movement above and in front of his or her ears while sucking. Signs that your baby has not successfully latched on to nipple:  Sucking sounds or smacking sounds from your baby while breastfeeding.  Nipple pain. If you think your baby has not latched on correctly, slip your finger into the corner of your baby's mouth to break the suction and place it between your baby's gums. Attempt breastfeeding initiation again. Signs of Successful Breastfeeding  Signs from your baby:  A gradual decrease in the number of sucks or complete cessation of sucking.  Falling asleep.  Relaxation of his or her body.  Retention of a small amount of milk in his or her mouth.  Letting go of your breast by himself or herself. Signs from you:  Breasts that have increased in firmness, weight, and size 1-3 hours after feeding.  Breasts that are softer immediately after breastfeeding.  Increased milk volume, as well as a change in milk consistency and color by the fifth day of breastfeeding.  Nipples that are not sore, cracked, or bleeding. Signs That Your [employer] is Getting Enough Milk  Wetting at least  1-2 diapers during the first 24 hours after birth.  Wetting at least 5-6 diapers every 24 hours for the first week after birth. The urine should be clear or pale yellow by 5 days after birth.  Wetting 6-8 diapers every 24 hours as your baby continues to grow and develop.  At least 3 stools in  a 24-hour period by age 259 days. The stool should be soft and yellow.  At least 3 stools in a 24-hour period by age 258 days. The stool should be seedy and yellow.  No loss of weight greater than 10% of birth weight during the first 70 days of age.  Average weight gain of 4-7 ounces (113-198 g) per week after age 25 days.  Consistent daily weight gain by age 31 days, without weight loss after the age of 2 weeks. After a feeding, your baby may spit up a small amount. This is common. Breastfeeding frequency and duration Frequent feeding will help you make more milk and can prevent sore nipples and breast engorgement. Breastfeed when you feel the need to reduce the fullness of your breasts or when your baby shows signs of hunger. This is called "breastfeeding on demand." Avoid introducing a pacifier to your baby while you are working to establish breastfeeding (the first 4-6 weeks after your baby is born). After this time you may choose to use a pacifier. Research has shown that pacifier use during the first year of a baby's life decreases the risk of sudden infant death syndrome (SIDS). Allow your baby to feed on each breast as long as he or she wants. Breastfeed until your baby is finished feeding. When your baby unlatches or falls asleep while feeding from the first breast, offer the second breast. Because newborns are often sleepy in the first few weeks of life, you may need to awaken your baby to get him or her to feed. Breastfeeding times will vary from baby to baby. However, the following rules can serve as a guide to help you ensure that your baby is properly fed:  Newborns (babies 34 weeks of age or younger)  may breastfeed every 1-3 hours.  Newborns should not go longer than 3 hours during the day or 5 hours during the night without breastfeeding.  You should breastfeed your baby a minimum of 8 times in a 24-hour period until you begin to introduce solid foods to your baby at around 71 months of age. Breast milk pumping Pumping and storing breast milk allows you to ensure that your baby is exclusively fed your breast milk, even at times when you are unable to breastfeed. This is especially important if you are going back to work while you are still breastfeeding or when you are not able to be present during feedings. Your lactation consultant can give you guidelines on how long it is safe to store breast milk. A breast pump is a machine that allows you to pump milk from your breast into a sterile bottle. The pumped breast milk can then be stored in a refrigerator or freezer. Some breast pumps are operated by hand, while others use electricity. Ask your lactation consultant which type will work best for you. Breast pumps can be purchased, but some hospitals and breastfeeding support groups lease breast pumps on a monthly basis. A lactation consultant can teach you how to hand express breast milk, if you prefer not to use a pump. Caring for your breasts while you breastfeed Nipples can become dry, cracked, and sore while breastfeeding. The following recommendations can help keep your breasts moisturized and healthy:  Avoid using soap on your nipples.  Wear a supportive bra. Although not required, special nursing bras and tank tops are designed to allow access to your breasts for breastfeeding without taking off your entire bra or top. Avoid wearing underwire-style bras or extremely tight  bras.  Air dry your nipples for 3-3minutes after each feeding.  Use only cotton bra pads to absorb leaked breast milk. Leaking of breast milk between feedings is normal.  Use lanolin on your nipples after breastfeeding.  Lanolin helps to maintain your skin's normal moisture barrier. If you use pure lanolin, you do not need to wash it off before feeding your baby again. Pure lanolin is not toxic to your baby. You may also hand express a few drops of breast milk and gently massage that milk into your nipples and allow the milk to air dry. In the first few weeks after giving birth, some women experience extremely full breasts (engorgement). Engorgement can make your breasts feel heavy, warm, and tender to the touch. Engorgement peaks within 3-5 days after you give birth. The following recommendations can help ease engorgement:  Completely empty your breasts while breastfeeding or pumping. You may want to start by applying warm, moist heat (in the shower or with warm water-soaked hand towels) just before feeding or pumping. This increases circulation and helps the milk flow. If your baby does not completely empty your breasts while breastfeeding, pump any extra milk after he or she is finished.  Wear a snug bra (nursing or regular) or tank top for 1-2 days to signal your body to slightly decrease milk production.  Apply ice packs to your breasts, unless this is too uncomfortable for you.  Make sure that your baby is latched on and positioned properly while breastfeeding. If engorgement persists after 48 hours of following these recommendations, contact your health care provider or a Science writer. Overall health care recommendations while breastfeeding  Eat healthy foods. Alternate between meals and snacks, eating 3 of each per day. Because what you eat affects your breast milk, some of the foods may make your baby more irritable than usual. Avoid eating these foods if you are sure that they are negatively affecting your baby.  Drink milk, fruit juice, and water to satisfy your thirst (about 10 glasses a day).  Rest often, relax, and continue to take your prenatal vitamins to prevent fatigue, stress, and  anemia.  Continue breast self-awareness checks.  Avoid chewing and smoking tobacco. Chemicals from cigarettes that pass into breast milk and exposure to secondhand smoke may harm your baby.  Avoid alcohol and drug use, including marijuana. Some medicines that may be harmful to your baby can pass through breast milk. It is important to ask your health care provider before taking any medicine, including all over-the-counter and prescription medicine as well as vitamin and herbal supplements. It is possible to become pregnant while breastfeeding. If birth control is desired, ask your health care provider about options that will be safe for your baby. Contact a health care provider if:  You feel like you want to stop breastfeeding or have become frustrated with breastfeeding.  You have painful breasts or nipples.  Your nipples are cracked or bleeding.  Your breasts are red, tender, or warm.  You have a swollen area on either breast.  You have a fever or chills.  You have nausea or vomiting.  You have drainage other than breast milk from your nipples.  Your breasts do not become full before feedings by the fifth day after you give birth.  You feel sad and depressed.  Your baby is too sleepy to eat well.  Your baby is having trouble sleeping.  Your baby is wetting less than 3 diapers in a 24-hour period.  Your baby  has less than 3 stools in a 24-hour period.  Your baby's skin or the white part of his or her eyes becomes yellow.  Your baby is not gaining weight by 65 days of age. Get help right away if:  Your baby is overly tired (lethargic) and does not want to wake up and feed.  Your baby develops an unexplained fever. This information is not intended to replace advice given to you by your health care provider. Make sure you discuss any questions you have with your health care provider. Document Released: 06/05/2005 Document Revised: 11/17/2015 Document Reviewed:  11/27/2012 Elsevier Interactive Patient Education  2017 [employer].

## 2016-06-22 NOTE — Progress Notes (Signed)
Patient reports back and hip pain. Pt signed up for babyscipts.  GC/Ch collected on urine. Pt did not get her rx for labetalol.

## 2016-06-23 LAB — COMPREHENSIVE METABOLIC PANEL
ALBUMIN: 3.7 g/dL (ref 3.6–5.1)
ALT: 11 U/L (ref 6–29)
AST: 12 U/L (ref 10–30)
Alkaline Phosphatase: 46 U/L (ref 33–115)
BUN: 3 mg/dL — ABNORMAL LOW (ref 7–25)
CALCIUM: 8.6 mg/dL (ref 8.6–10.2)
CHLORIDE: 107 mmol/L (ref 98–110)
CO2: 23 mmol/L (ref 20–31)
Creat: 0.44 mg/dL — ABNORMAL LOW (ref 0.50–1.10)
Glucose, Bld: 83 mg/dL (ref 65–99)
POTASSIUM: 3.6 mmol/L (ref 3.5–5.3)
Sodium: 137 mmol/L (ref 135–146)
TOTAL PROTEIN: 6.1 g/dL (ref 6.1–8.1)
Total Bilirubin: 0.3 mg/dL (ref 0.2–1.2)

## 2016-06-23 LAB — PROTEIN / CREATININE RATIO, URINE
CREATININE, URINE: 141 mg/dL (ref 20–320)
Protein Creatinine Ratio: 156 mg/g creat (ref 21–161)
Total Protein, Urine: 22 mg/dL (ref 5–24)

## 2016-06-23 LAB — CULTURE, OB URINE: Organism ID, Bacteria: NO GROWTH

## 2016-06-23 LAB — GC/CHLAMYDIA PROBE AMP (~~LOC~~) NOT AT ARMC
CHLAMYDIA, DNA PROBE: NEGATIVE
NEISSERIA GONORRHEA: NEGATIVE

## 2016-06-26 ENCOUNTER — Telehealth: Payer: Self-pay | Admitting: *Deleted

## 2016-06-26 DIAGNOSIS — O169 Unspecified maternal hypertension, unspecified trimester: Secondary | ICD-10-CM

## 2016-06-26 LAB — PRENATAL PROFILE (SOLSTAS)
ANTIBODY SCREEN: NEGATIVE
HIV 1&2 Ab, 4th Generation: NONREACTIVE
Hepatitis B Surface Ag: NEGATIVE
RUBELLA: 9.25 {index} — AB (ref <0.90)
Rh Type: POSITIVE

## 2016-06-26 LAB — HEMOGLOBIN A1C

## 2016-06-26 MED ORDER — AMLODIPINE BESYLATE 10 MG PO TABS: 10.0000 mg | ORAL_TABLET | Freq: Every day | ORAL | 1 refills | Status: DC

## 2016-06-26 NOTE — Telephone Encounter (Signed)
Pt left message on 1/5 stating that she would like her BP medication changed back to the $4 medication (Amlodipine).  The new Rx will cost $44 and she cannot afford it.

## 2016-06-26 NOTE — Telephone Encounter (Signed)
Returned patient call, let her know I spoke with Dr Rip Harbour and he agreed ok to continue amlodipine due to cost reasons. Prescription to pharmacy. Patient voiced understanding. Also let her know that she needs to come in for a lab draw today or tomorrow for a recollect. Patient voiced understanding and stated she would come in tomorrow.

## 2016-06-27 ENCOUNTER — Other Ambulatory Visit: Payer: Medicaid Other

## 2016-06-29 ENCOUNTER — Other Ambulatory Visit: Payer: Medicaid Other

## 2016-06-29 DIAGNOSIS — O169 Unspecified maternal hypertension, unspecified trimester: Secondary | ICD-10-CM

## 2016-06-29 LAB — COMPREHENSIVE METABOLIC PANEL
ALK PHOS: 45 U/L (ref 33–115)
ALT: 11 U/L (ref 6–29)
AST: 13 U/L (ref 10–30)
Albumin: 3.5 g/dL — ABNORMAL LOW (ref 3.6–5.1)
BILIRUBIN TOTAL: 0.4 mg/dL (ref 0.2–1.2)
BUN: 3 mg/dL — AB (ref 7–25)
CO2: 24 mmol/L (ref 20–31)
CREATININE: 0.4 mg/dL — AB (ref 0.50–1.10)
Calcium: 8.4 mg/dL — ABNORMAL LOW (ref 8.6–10.2)
Chloride: 106 mmol/L (ref 98–110)
GLUCOSE: 85 mg/dL (ref 65–99)
Potassium: 3.6 mmol/L (ref 3.5–5.3)
SODIUM: 136 mmol/L (ref 135–146)
Total Protein: 6.2 g/dL (ref 6.1–8.1)

## 2016-06-30 LAB — HEMOGLOBIN A1C
Hgb A1c MFr Bld: 4.5 % (ref <5.7)
Mean Plasma Glucose: 82 mg/dL

## 2016-06-30 LAB — PROTEIN / CREATININE RATIO, URINE
CREATININE, URINE: 78 mg/dL (ref 20–320)
Protein Creatinine Ratio: 141 mg/g creat (ref 21–161)
Total Protein, Urine: 11 mg/dL (ref 5–24)

## 2016-07-20 ENCOUNTER — Encounter: Payer: Self-pay | Admitting: Obstetrics and Gynecology

## 2016-07-20 ENCOUNTER — Ambulatory Visit (INDEPENDENT_AMBULATORY_CARE_PROVIDER_SITE_OTHER): Payer: Medicaid Other | Admitting: Obstetrics and Gynecology

## 2016-07-20 VITALS — BP 133/77 | HR 88 | Wt 187.7 lb

## 2016-07-20 DIAGNOSIS — O99210 Obesity complicating pregnancy, unspecified trimester: Secondary | ICD-10-CM | POA: Insufficient documentation

## 2016-07-20 DIAGNOSIS — O99212 Obesity complicating pregnancy, second trimester: Secondary | ICD-10-CM

## 2016-07-20 DIAGNOSIS — O10019 Pre-existing essential hypertension complicating pregnancy, unspecified trimester: Secondary | ICD-10-CM

## 2016-07-20 DIAGNOSIS — O0992 Supervision of high risk pregnancy, unspecified, second trimester: Secondary | ICD-10-CM

## 2016-07-20 DIAGNOSIS — D279 Benign neoplasm of unspecified ovary: Secondary | ICD-10-CM | POA: Diagnosis not present

## 2016-07-20 DIAGNOSIS — O10012 Pre-existing essential hypertension complicating pregnancy, second trimester: Secondary | ICD-10-CM

## 2016-07-20 DIAGNOSIS — Z6831 Body mass index (BMI) 31.0-31.9, adult: Secondary | ICD-10-CM

## 2016-07-20 DIAGNOSIS — E669 Obesity, unspecified: Secondary | ICD-10-CM | POA: Diagnosis not present

## 2016-07-20 LAB — POCT URINALYSIS DIP (DEVICE)
BILIRUBIN URINE: NEGATIVE
GLUCOSE, UA: NEGATIVE mg/dL
Hgb urine dipstick: NEGATIVE
KETONES UR: NEGATIVE mg/dL
Nitrite: NEGATIVE
PROTEIN: NEGATIVE mg/dL
SPECIFIC GRAVITY, URINE: 1.01 (ref 1.005–1.030)
Urobilinogen, UA: 1 mg/dL (ref 0.0–1.0)
pH: 6.5 (ref 5.0–8.0)

## 2016-07-20 LAB — TSH: TSH: 0.82 mIU/L

## 2016-07-20 LAB — CBC
HCT: 32.8 % — ABNORMAL LOW (ref 35.0–45.0)
HEMOGLOBIN: 10.9 g/dL — AB (ref 11.7–15.5)
MCH: 33 pg (ref 27.0–33.0)
MCHC: 33.2 g/dL (ref 32.0–36.0)
MCV: 99.4 fL (ref 80.0–100.0)
MPV: 11.6 fL (ref 7.5–12.5)
PLATELETS: 202 10*3/uL (ref 140–400)
RBC: 3.3 MIL/uL — ABNORMAL LOW (ref 3.80–5.10)
RDW: 13.5 % (ref 11.0–15.0)
WBC: 6.4 10*3/uL (ref 3.8–10.8)

## 2016-07-20 NOTE — Progress Notes (Signed)
Prenatal Visit Note Date: 07/20/2016 Clinic: [facility]  Subjective:  [name] [name] is a 31 y.o. [id number] at [redacted]w[redacted]d being seen today for ongoing prenatal care.  She is currently monitored for the following issues for this high-risk pregnancy and has Marijuana use; Mature cystic teratoma of ovary; Tobacco abuse; Supervision of high risk pregnancy in second trimester; Hypertension in pregnancy, essential, antepartum; BMI 31.0-31.9,adult; and Obesity in pregnancy, antepartum on her problem list.  Patient reports no complaints.   Contractions: Not present. Vag. Bleeding: None.  Movement: Present. Denies leaking of fluid.   The following portions of the patient's history were reviewed and updated as appropriate: allergies, current medications, past family history, past medical history, past social history, past surgical history and problem list. Problem list updated.  Objective:   Vitals:   07/20/16 1113  BP: 133/77  Pulse: 88  Weight: 187 lb 11.2 oz (85.1 kg)    Fetal Status:     Movement: Present     General:  Alert, oriented and cooperative. Patient is in no acute distress.  Skin: Skin is warm and dry. No rash noted.   Cardiovascular: Normal heart rate noted  Respiratory: Normal respiratory effort, no problems with respiration noted  Abdomen: Soft, gravid, appropriate for gestational age. Pain/Pressure: Present     Pelvic:  Cervical exam deferred        Extremities: Normal range of motion.  Edema: None  Mental Status: Normal mood and affect. Normal behavior. Normal judgment and thought content.   Urinalysis: Urine Protein: Negative Urine Glucose: Negative  Assessment and Plan:  Pregnancy: G3P2002 at [redacted]w[redacted]d  1. Supervision of high risk pregnancy in second trimester Routine care. Anatomy scan already scheduled - CBC - Alpha fetoprotein, maternal  2. Obesity in pregnancy, antepartum See below  3. BMI 31.0-31.9,adult See below  4. Dermoid cyst of ovary,  unspecified laterality Noted on problem list from a few years ago. Follow up anatomy scan  5. Hypertension in pregnancy, essential, antepartum Continue with norvasc 5 qday due to costs of labetalol. Continue baby asa. Baseline tsh today. - TSH  Preterm labor symptoms and general obstetric precautions including but not limited to vaginal bleeding, contractions, leaking of fluid and fetal movement were reviewed in detail with the patient. Please refer to After Visit Summary for other counseling recommendations.  Return in about 3 weeks (around 08/10/2016).   Aletha Halim, MD

## 2016-07-21 LAB — ALPHA FETOPROTEIN, MATERNAL
AFP: 34.9 ng/mL
Curr Gest Age: 18 weeks
MoM for AFP: 0.81
OPEN SPINA BIFIDA: NEGATIVE

## 2016-07-27 ENCOUNTER — Ambulatory Visit (HOSPITAL_COMMUNITY)
Admission: RE | Admit: 2016-07-27 | Discharge: 2016-07-27 | Disposition: A | Discharge status: Home / Self Care | Payer: Medicaid Other | Source: Ambulatory Visit | Attending: Family Medicine | Admitting: Family Medicine

## 2016-07-27 ENCOUNTER — Other Ambulatory Visit: Payer: Self-pay | Admitting: Family Medicine

## 2016-07-27 ENCOUNTER — Encounter (HOSPITAL_COMMUNITY): Payer: Self-pay

## 2016-07-27 DIAGNOSIS — Z363 Encounter for antenatal screening for malformations: Secondary | ICD-10-CM | POA: Insufficient documentation

## 2016-07-27 DIAGNOSIS — O99332 Smoking (tobacco) complicating pregnancy, second trimester: Secondary | ICD-10-CM | POA: Insufficient documentation

## 2016-07-27 DIAGNOSIS — Z3A19 19 weeks gestation of pregnancy: Secondary | ICD-10-CM | POA: Diagnosis not present

## 2016-07-27 DIAGNOSIS — O10019 Pre-existing essential hypertension complicating pregnancy, unspecified trimester: Secondary | ICD-10-CM

## 2016-07-27 DIAGNOSIS — Z3689 Encounter for other specified antenatal screening: Secondary | ICD-10-CM

## 2016-07-27 DIAGNOSIS — O0992 Supervision of high risk pregnancy, unspecified, second trimester: Secondary | ICD-10-CM

## 2016-07-27 DIAGNOSIS — F17210 Nicotine dependence, cigarettes, uncomplicated: Secondary | ICD-10-CM

## 2016-07-27 DIAGNOSIS — O10919 Unspecified pre-existing hypertension complicating pregnancy, unspecified trimester: Secondary | ICD-10-CM

## 2016-07-27 DIAGNOSIS — O10012 Pre-existing essential hypertension complicating pregnancy, second trimester: Secondary | ICD-10-CM | POA: Insufficient documentation

## 2016-07-27 NOTE — Addendum Note (Signed)
Encounter addended by: Jill Poling, RT on: 07/27/2016  2:24 PM<BR>    Actions taken: Imaging Exam ended

## 2016-08-07 ENCOUNTER — Ambulatory Visit (INDEPENDENT_AMBULATORY_CARE_PROVIDER_SITE_OTHER): Payer: Medicaid Other | Admitting: Obstetrics and Gynecology

## 2016-08-07 ENCOUNTER — Encounter: Payer: Self-pay | Admitting: Obstetrics and Gynecology

## 2016-08-07 VITALS — BP 123/78 | HR 82 | Wt 190.1 lb

## 2016-08-07 DIAGNOSIS — E669 Obesity, unspecified: Secondary | ICD-10-CM | POA: Diagnosis not present

## 2016-08-07 DIAGNOSIS — O10012 Pre-existing essential hypertension complicating pregnancy, second trimester: Secondary | ICD-10-CM

## 2016-08-07 DIAGNOSIS — O0992 Supervision of high risk pregnancy, unspecified, second trimester: Secondary | ICD-10-CM

## 2016-08-07 DIAGNOSIS — O99212 Obesity complicating pregnancy, second trimester: Secondary | ICD-10-CM

## 2016-08-07 DIAGNOSIS — O10019 Pre-existing essential hypertension complicating pregnancy, unspecified trimester: Secondary | ICD-10-CM

## 2016-08-07 DIAGNOSIS — O10919 Unspecified pre-existing hypertension complicating pregnancy, unspecified trimester: Secondary | ICD-10-CM

## 2016-08-07 DIAGNOSIS — O99210 Obesity complicating pregnancy, unspecified trimester: Secondary | ICD-10-CM

## 2016-08-07 NOTE — Progress Notes (Signed)
Prenatal Visit Note Date: 08/07/2016 Clinic: [facility]  Subjective:  [name] [name] is a 31 y.o. [id number] at [redacted]w[redacted]d being seen today for ongoing prenatal care.  She is currently monitored for the following issues for this high-risk pregnancy and has Marijuana use; Tobacco abuse; Supervision of high risk pregnancy in second trimester; Hypertension in pregnancy, essential, antepartum; BMI 31.0-31.9,adult; and Obesity in pregnancy, antepartum on her problem list.  Patient reports no complaints.   Contractions: Not present.  .  Movement: Present. Denies leaking of fluid.   The following portions of the patient's history were reviewed and updated as appropriate: allergies, current medications, past family history, past medical history, past social history, past surgical history and problem list. Problem list updated.  Objective:   Vitals:   08/07/16 1337  BP: 123/78  Pulse: 82  Weight: 190 lb 1.6 oz (86.2 kg)    Fetal Status: Fetal Heart Rate (bpm): 147   Movement: Present     General:  Alert, oriented and cooperative. Patient is in no acute distress.  Skin: Skin is warm and dry. No rash noted.   Cardiovascular: Normal heart rate noted  Respiratory: Normal respiratory effort, no problems with respiration noted  Abdomen: Soft, gravid, appropriate for gestational age. Pain/Pressure: Present     Pelvic:  Cervical exam deferred        Extremities: Normal range of motion.  Edema: Trace  Mental Status: Normal mood and affect. Normal behavior. Normal judgment and thought content.   Urinalysis:      Assessment and Plan:  Pregnancy: G3P2002 at [redacted]w[redacted]d  1. Chronic hypertension affecting pregnancy Routine care. Continue norvasc 5 qday and baby asa.  Follow up qmonth surveillance growth scan in one month. Start ap testing at 32 or 32wks - [name] MFM OB FOLLOW UP; Future  2. Hypertension in pregnancy, essential, antepartum See above  3. Supervision of high risk pregnancy  in second trimester Routine care  4. Obesity in pregnancy, antepartum No change in plan of care.   Preterm labor symptoms and general obstetric precautions including but not limited to vaginal bleeding, contractions, leaking of fluid and fetal movement were reviewed in detail with the patient. Please refer to After Visit Summary for other counseling recommendations.  Return in about 4 weeks (around 09/04/2016) for coordinate with u/s in 4wks. Aletha Halim, MD

## 2016-09-05 ENCOUNTER — Other Ambulatory Visit: Payer: Self-pay | Admitting: Obstetrics and Gynecology

## 2016-09-05 ENCOUNTER — Ambulatory Visit (INDEPENDENT_AMBULATORY_CARE_PROVIDER_SITE_OTHER): Payer: Medicaid Other | Admitting: Obstetrics and Gynecology

## 2016-09-05 ENCOUNTER — Ambulatory Visit (HOSPITAL_COMMUNITY)
Admission: RE | Admit: 2016-09-05 | Discharge: 2016-09-05 | Disposition: A | Discharge status: Home / Self Care | Payer: Medicaid Other | Source: Ambulatory Visit | Attending: Obstetrics and Gynecology | Admitting: Obstetrics and Gynecology

## 2016-09-05 VITALS — BP 120/71 | HR 71

## 2016-09-05 DIAGNOSIS — E669 Obesity, unspecified: Secondary | ICD-10-CM

## 2016-09-05 DIAGNOSIS — O10012 Pre-existing essential hypertension complicating pregnancy, second trimester: Secondary | ICD-10-CM | POA: Insufficient documentation

## 2016-09-05 DIAGNOSIS — Z3A24 24 weeks gestation of pregnancy: Secondary | ICD-10-CM | POA: Diagnosis not present

## 2016-09-05 DIAGNOSIS — O0992 Supervision of high risk pregnancy, unspecified, second trimester: Secondary | ICD-10-CM

## 2016-09-05 DIAGNOSIS — O10919 Unspecified pre-existing hypertension complicating pregnancy, unspecified trimester: Secondary | ICD-10-CM

## 2016-09-05 DIAGNOSIS — O10019 Pre-existing essential hypertension complicating pregnancy, unspecified trimester: Secondary | ICD-10-CM

## 2016-09-05 DIAGNOSIS — O99210 Obesity complicating pregnancy, unspecified trimester: Secondary | ICD-10-CM

## 2016-09-05 DIAGNOSIS — O99212 Obesity complicating pregnancy, second trimester: Secondary | ICD-10-CM | POA: Diagnosis not present

## 2016-09-05 MED ORDER — PANTOPRAZOLE SODIUM 20 MG PO TBEC
20.0000 mg | DELAYED_RELEASE_TABLET | Freq: Every day | ORAL | 3 refills | Status: DC
Start: 2016-09-05 — End: 2016-10-09

## 2016-09-05 NOTE — Progress Notes (Signed)
   PRENATAL VISIT NOTE  Subjective:  [name] [name] is a 31 y.o. G3P2002 at [redacted]w[redacted]d being seen today for ongoing prenatal care.  She is currently monitored for the following issues for this high-risk pregnancy and has Marijuana use; Tobacco abuse; Supervision of high risk pregnancy in second trimester; Hypertension in pregnancy, essential, antepartum; BMI 31.0-31.9,adult; and Obesity in pregnancy, antepartum on her problem list.  Patient reports no complaints.  Contractions: Not present. Vag. Bleeding: None.  Movement: Present. Denies leaking of fluid.   The following portions of the patient's history were reviewed and updated as appropriate: allergies, current medications, past family history, past medical history, past social history, past surgical history and problem list. Problem list updated.  Objective:   Vitals:   09/05/16 1450  BP: 120/71  Pulse: 71    Fetal Status: Fetal Heart Rate (bpm): 140 Fundal Height: 25 cm Movement: Present     General:  Alert, oriented and cooperative. Patient is in no acute distress.  Skin: Skin is warm and dry. No rash noted.   Cardiovascular: Normal heart rate noted  Respiratory: Normal respiratory effort, no problems with respiration noted  Abdomen: Soft, gravid, appropriate for gestational age. Pain/Pressure: Present     Pelvic:  Cervical exam deferred        Extremities: Normal range of motion.  Edema: Trace  Mental Status: Normal mood and affect. Normal behavior. Normal judgment and thought content.   Assessment and Plan:  Pregnancy: G3P2002 at [redacted]w[redacted]d  1. Supervision of high risk pregnancy in second trimester Patient is doing well without complaints Third trimester labs with glucola next visit Rx protonix provided to help with acid reflux  2. Hypertension in pregnancy, essential, antepartum Normotensive Continue ASA and Norvasc 3/20 Normal anatomy with EFW 714 gm (50%tile)  3. Obesity in pregnancy, antepartum 10 lb weight gain thus  far  Preterm labor symptoms and general obstetric precautions including but not limited to vaginal bleeding, contractions, leaking of fluid and fetal movement were reviewed in detail with the patient. Please refer to After Visit Summary for other counseling recommendations.  Return in about 3 weeks (around 09/26/2016) for ROB, 2 hr glucola next visit.   Mora Bellman, MD

## 2016-09-05 NOTE — Progress Notes (Signed)
Patient states that she is having indigestion more at night and tums doesn't help with the symptoms.

## 2016-09-25 ENCOUNTER — Ambulatory Visit (INDEPENDENT_AMBULATORY_CARE_PROVIDER_SITE_OTHER): Payer: Medicaid Other | Admitting: Obstetrics & Gynecology

## 2016-09-25 ENCOUNTER — Ambulatory Visit (HOSPITAL_COMMUNITY)
Admission: RE | Admit: 2016-09-25 | Discharge: 2016-09-25 | Disposition: A | Discharge status: Home / Self Care | Payer: Medicaid Other | Source: Ambulatory Visit | Attending: Family Medicine | Admitting: Family Medicine

## 2016-09-25 VITALS — BP 114/73 | HR 79 | Wt 191.2 lb

## 2016-09-25 DIAGNOSIS — S99922A Unspecified injury of left foot, initial encounter: Secondary | ICD-10-CM

## 2016-09-25 DIAGNOSIS — Z23 Encounter for immunization: Secondary | ICD-10-CM

## 2016-09-25 DIAGNOSIS — O0992 Supervision of high risk pregnancy, unspecified, second trimester: Secondary | ICD-10-CM

## 2016-09-25 DIAGNOSIS — Z720 Tobacco use: Secondary | ICD-10-CM

## 2016-09-25 DIAGNOSIS — W2209XA Striking against other stationary object, initial encounter: Secondary | ICD-10-CM | POA: Insufficient documentation

## 2016-09-25 DIAGNOSIS — O0990 Supervision of high risk pregnancy, unspecified, unspecified trimester: Secondary | ICD-10-CM

## 2016-09-25 DIAGNOSIS — O99322 Drug use complicating pregnancy, second trimester: Secondary | ICD-10-CM

## 2016-09-25 DIAGNOSIS — F1290 Cannabis use, unspecified, uncomplicated: Secondary | ICD-10-CM | POA: Diagnosis not present

## 2016-09-25 MED ORDER — MONTELUKAST SODIUM 10 MG PO TABS: 10.0000 mg | ORAL_TABLET | Freq: Every day | ORAL | 0 refills | Status: DC

## 2016-09-25 MED ORDER — TETANUS-DIPHTH-ACELL PERTUSSIS 5-2.5-18.5 LF-MCG/0.5 IM SUSP
0.5000 mL | Freq: Once | INTRAMUSCULAR | Status: AC
  Administered 2016-09-25: 0.5 mL via INTRAMUSCULAR

## 2016-09-25 MED ORDER — CETIRIZINE HCL 10 MG PO TABS: 10.0000 mg | ORAL_TABLET | Freq: Every day | ORAL | 0 refills | Status: DC

## 2016-09-25 NOTE — Progress Notes (Addendum)
28 wk packet given; tdap vaccine given.  Pt requests refill on Zyrtec and Singular.   Educated pt on [facility]    PRENATAL VISIT NOTE  Subjective:  Honor Frison is a 31 y.o. G3P2002 at [redacted]w[redacted]d being seen today for ongoing prenatal care.  She is currently monitored for the following issues for this high-risk pregnancy and has Marijuana use; Tobacco abuse; Supervision of high risk pregnancy in second trimester; Hypertension in pregnancy, essential, antepartum; BMI 31.0-31.9,adult; and Obesity in pregnancy, antepartum on her problem list.  Patient reports left toe pain.  Contractions: Not present. Vag. Bleeding: None.  Movement: Present. Denies leaking of fluid.   The following portions of the patient's history were reviewed and updated as appropriate: allergies, current medications, past family history, past medical history, past social history, past surgical history and problem list. Problem list updated.  Objective:   Vitals:   09/25/16 0852  BP: 114/73  Pulse: 79  Weight: 191 lb 3.2 oz (86.7 kg)    Fetal Status: Fetal Heart Rate (bpm): 144 Fundal Height: 28 cm Movement: Present     General:  Alert, oriented and cooperative. Patient is in no acute distress.  Skin: Skin is warm and dry. No rash noted.   Cardiovascular: Normal heart rate noted  Respiratory: Normal respiratory effort, no problems with respiration noted  Abdomen: Soft, gravid, appropriate for gestational age. Pain/Pressure: Present     Pelvic:  Cervical exam deferred        Extremities: Normal range of motion.  Edema: Trace  Mental Status: Normal mood and affect. Normal behavior. Normal judgment and thought content.   Assessment and Plan:  Pregnancy: G3P2002 at [redacted]w[redacted]d  1. Supervision of high risk pregnancy, antepartum -BP well controled on Norasc - Glucose Tolerance, 2 Hours w/1 Hour - RPR - HIV antibody (with reflex) - CBC  2. Tobacco abuse Encouraged to quit--down to 4 cigs/day  3. Marijuana use As  stopped smoking THC.  4.  Left great toe pain after kicked the side of a piece of furniture by mistake --Dr. Vanetta Shawl to evaluate today.    Preterm labor symptoms and general obstetric precautions including but not limited to vaginal bleeding, contractions, leaking of fluid and fetal movement were reviewed in detail with the patient. Please refer to After Visit Summary for other counseling recommendations.  Return in about 2 weeks (around 10/09/2016).   Guss Bunde, MD    Evaluated patient's left 1st metatarsal. Patient had hit her toe on the side of her bed just over 24 hours ago, she has not been able to bear weight on the foot due to pain both after the injury and today. 1st metatarsal is swollen with subtle ecchymosis. Tapping on the bone causes pain. Limited ROM due to pain. Will obtain XRay of toe to rule out fracture. Motrin and ice in the meantime.   Zenda Alpers, DO  [facility], [facility]

## 2016-09-25 NOTE — Addendum Note (Signed)
Addended by: Zenda Alpers on: 09/25/2016 09:57 AM   Modules accepted: Orders

## 2016-09-26 LAB — CBC
HEMATOCRIT: 33.9 % — AB (ref 34.0–46.6)
Hemoglobin: 11 g/dL — ABNORMAL LOW (ref 11.1–15.9)
MCH: 31.9 pg (ref 26.6–33.0)
MCHC: 32.4 g/dL (ref 31.5–35.7)
MCV: 98 fL — AB (ref 79–97)
PLATELETS: 198 10*3/uL (ref 150–379)
RBC: 3.45 x10E6/uL — AB (ref 3.77–5.28)
RDW: 12.9 % (ref 12.3–15.4)
WBC: 5.9 10*3/uL (ref 3.4–10.8)

## 2016-09-26 LAB — HIV ANTIBODY (ROUTINE TESTING W REFLEX): HIV Screen 4th Generation wRfx: NONREACTIVE

## 2016-09-26 LAB — GLUCOSE TOLERANCE, 2 HOURS W/ 1HR
GLUCOSE, 2 HOUR: 89 mg/dL (ref 65–152)
Glucose, 1 hour: 142 mg/dL (ref 65–179)
Glucose, Fasting: 69 mg/dL (ref 65–91)

## 2016-09-26 LAB — RPR: RPR Ser Ql: NONREACTIVE

## 2016-10-09 ENCOUNTER — Ambulatory Visit (INDEPENDENT_AMBULATORY_CARE_PROVIDER_SITE_OTHER): Payer: Medicaid Other | Admitting: Family Medicine

## 2016-10-09 ENCOUNTER — Encounter: Payer: Self-pay | Admitting: General Practice

## 2016-10-09 VITALS — BP 130/84 | HR 85 | Wt 200.0 lb

## 2016-10-09 DIAGNOSIS — K219 Gastro-esophageal reflux disease without esophagitis: Secondary | ICD-10-CM | POA: Diagnosis not present

## 2016-10-09 DIAGNOSIS — O10013 Pre-existing essential hypertension complicating pregnancy, third trimester: Secondary | ICD-10-CM

## 2016-10-09 DIAGNOSIS — O0993 Supervision of high risk pregnancy, unspecified, third trimester: Secondary | ICD-10-CM

## 2016-10-09 DIAGNOSIS — O10019 Pre-existing essential hypertension complicating pregnancy, unspecified trimester: Secondary | ICD-10-CM

## 2016-10-09 DIAGNOSIS — O0992 Supervision of high risk pregnancy, unspecified, second trimester: Secondary | ICD-10-CM

## 2016-10-09 MED ORDER — OMEPRAZOLE MAGNESIUM 20 MG PO TBEC: 20.0000 mg | DELAYED_RELEASE_TABLET | Freq: Every day | ORAL | 3 refills | Status: DC

## 2016-10-09 NOTE — Patient Instructions (Signed)
 Third Trimester of Pregnancy The third trimester is from week 28 through week 40 (months 7 through 9). The third trimester is a time when the unborn baby (fetus) is growing rapidly. At the end of the ninth month, the fetus is about 20 inches in length and weighs 6-10 pounds. Body changes during your third trimester Your body will continue to go through many changes during pregnancy. The changes vary from woman to woman. During the third trimester:  Your weight will continue to increase. You can expect to gain 25-35 pounds (11-16 kg) by the end of the pregnancy.  You may begin to get stretch marks on your hips, abdomen, and breasts.  You may urinate more often because the fetus is moving lower into your pelvis and pressing on your bladder.  You may develop or continue to have heartburn. This is caused by increased hormones that slow down muscles in the digestive tract.  You may develop or continue to have constipation because increased hormones slow digestion and cause the muscles that push waste through your intestines to relax.  You may develop hemorrhoids. These are swollen veins (varicose veins) in the rectum that can itch or be painful.  You may develop swollen, bulging veins (varicose veins) in your legs.  You may have increased body aches in the pelvis, back, or thighs. This is due to weight gain and increased hormones that are relaxing your joints.  You may have changes in your hair. These can include thickening of your hair, rapid growth, and changes in texture. Some women also have hair loss during or after pregnancy, or hair that feels dry or thin. Your hair will most likely return to normal after your baby is born.  Your breasts will continue to grow and they will continue to become tender. A yellow fluid (colostrum) may leak from your breasts. This is the first milk you are producing for your baby.  Your belly button may stick out.  You may notice more swelling in your  hands, face, or ankles.  You may have increased tingling or numbness in your hands, arms, and legs. The skin on your belly may also feel numb.  You may feel short of breath because of your expanding uterus.  You may have more problems sleeping. This can be caused by the size of your belly, increased need to urinate, and an increase in your body's metabolism.  You may notice the fetus "dropping," or moving lower in your abdomen (lightening).  You may have increased vaginal discharge.  You may notice your joints feel loose and you may have pain around your pelvic bone.  What to expect at prenatal visits You will have prenatal exams every 2 weeks until week 36. Then you will have weekly prenatal exams. During a routine prenatal visit:  You will be weighed to make sure you and the baby are growing normally.  Your blood pressure will be taken.  Your abdomen will be measured to track your baby's growth.  The fetal heartbeat will be listened to.  Any test results from the previous visit will be discussed.  You may have a cervical check near your due date to see if your cervix has softened or thinned (effaced).  You will be tested for Group B streptococcus. This happens between 35 and 37 weeks.  Your health care provider may ask you:  What your birth plan is.  How you are feeling.  If you are feeling the baby move.  If you have   had any abnormal symptoms, such as leaking fluid, bleeding, severe headaches, or abdominal cramping.  If you are using any tobacco products, including cigarettes, chewing tobacco, and electronic cigarettes.  If you have any questions.  Other tests or screenings that may be performed during your third trimester include:  Blood tests that check for low iron levels (anemia).  Fetal testing to check the health, activity level, and growth of the fetus. Testing is done if you have certain medical conditions or if there are problems during the  pregnancy.  Nonstress test (NST). This test checks the health of your baby to make sure there are no signs of problems, such as the baby not getting enough oxygen. During this test, a belt is placed around your belly. The baby is made to move, and its heart rate is monitored during movement.  What is false labor? False labor is a condition in which you feel small, irregular tightenings of the muscles in the womb (contractions) that usually go away with rest, changing position, or drinking water. These are called Braxton Hicks contractions. Contractions may last for hours, days, or even weeks before true labor sets in. If contractions come at regular intervals, become more frequent, increase in intensity, or become painful, you should see your health care provider. What are the signs of labor?  Abdominal cramps.  Regular contractions that start at 10 minutes apart and become stronger and more frequent with time.  Contractions that start on the top of the uterus and spread down to the lower abdomen and back.  Increased pelvic pressure and dull back pain.  A watery or bloody mucus discharge that comes from the vagina.  Leaking of amniotic fluid. This is also known as your "water breaking." It could be a slow trickle or a gush. Let your health care provider know if it has a color or strange odor. If you have any of these signs, call your health care provider right away, even if it is before your due date. Follow these instructions at home: Medicines  Follow your health care provider's instructions regarding medicine use. Specific medicines may be either safe or unsafe to take during pregnancy.  Take a prenatal vitamin that contains at least 600 micrograms (mcg) of folic acid.  If you develop constipation, try taking a stool softener if your health care provider approves. Eating and drinking  Eat a balanced diet that includes fresh fruits and vegetables, whole grains, good sources of protein  such as meat, eggs, or tofu, and low-fat dairy. Your health care provider will help you determine the amount of weight gain that is right for you.  Avoid raw meat and uncooked cheese. These carry germs that can cause birth defects in the baby.  If you have low calcium intake from food, talk to your health care provider about whether you should take a daily calcium supplement.  Eat four or five small meals rather than three large meals a day.  Limit foods that are high in fat and processed sugars, such as fried and sweet foods.  To prevent constipation: ? Drink enough fluid to keep your urine clear or pale yellow. ? Eat foods that are high in fiber, such as fresh fruits and vegetables, whole grains, and beans. Activity  Exercise only as directed by your health care provider. Most women can continue their usual exercise routine during pregnancy. Try to exercise for 30 minutes at least 5 days a week. Stop exercising if you experience uterine contractions.  Avoid   heavy lifting.  Do not exercise in extreme heat or humidity, or at high altitudes.  Wear low-heel, comfortable shoes.  Practice good posture.  You may continue to have sex unless your health care provider tells you otherwise. Relieving pain and discomfort  Take frequent breaks and rest with your legs elevated if you have leg cramps or low back pain.  Take warm sitz baths to soothe any pain or discomfort caused by hemorrhoids. Use hemorrhoid cream if your health care provider approves.  Wear a good support bra to prevent discomfort from breast tenderness.  If you develop varicose veins: ? Wear support pantyhose or compression stockings as told by your healthcare provider. ? Elevate your feet for 15 minutes, 3-4 times a day. Prenatal care  Write down your questions. Take them to your prenatal visits.  Keep all your prenatal visits as told by your health care provider. This is important. Safety  Wear your seat belt at  all times when driving.  Make a list of emergency phone numbers, including numbers for family, friends, the hospital, and police and fire departments. General instructions  Avoid cat litter boxes and soil used by cats. These carry germs that can cause birth defects in the baby. If you have a cat, ask someone to clean the litter box for you.  Do not travel far distances unless it is absolutely necessary and only with the approval of your health care provider.  Do not use hot tubs, steam rooms, or saunas.  Do not drink alcohol.  Do not use any products that contain nicotine or tobacco, such as cigarettes and e-cigarettes. If you need help quitting, ask your health care provider.  Do not use any medicinal herbs or unprescribed drugs. These chemicals affect the formation and growth of the baby.  Do not douche or use tampons or scented sanitary pads.  Do not cross your legs for long periods of time.  To prepare for the arrival of your baby: ? Take prenatal classes to understand, practice, and ask questions about labor and delivery. ? Make a trial run to the hospital. ? Visit the hospital and tour the maternity area. ? Arrange for maternity or paternity leave through employers. ? Arrange for family and friends to take care of pets while you are in the hospital. ? Purchase a rear-facing car seat and make sure you know how to install it in your car. ? Pack your hospital bag. ? Prepare the baby's nursery. Make sure to remove all pillows and stuffed animals from the baby's crib to prevent suffocation.  Visit your dentist if you have not gone during your pregnancy. Use a soft toothbrush to brush your teeth and be gentle when you floss. Contact a health care provider if:  You are unsure if you are in labor or if your water has broken.  You become dizzy.  You have mild pelvic cramps, pelvic pressure, or nagging pain in your abdominal area.  You have lower back pain.  You have persistent  nausea, vomiting, or diarrhea.  You have an unusual or bad smelling vaginal discharge.  You have pain when you urinate. Get help right away if:  Your water breaks before 37 weeks.  You have regular contractions less than 5 minutes apart before 37 weeks.  You have a fever.  You are leaking fluid from your vagina.  You have spotting or bleeding from your vagina.  You have severe abdominal pain or cramping.  You have rapid weight loss or weight   gain.  You have shortness of breath with chest pain.  You notice sudden or extreme swelling of your face, hands, ankles, feet, or legs.  Your baby makes fewer than 10 movements in 2 hours.  You have severe headaches that do not go away when you take medicine.  You have vision changes. Summary  The third trimester is from week 28 through week 40, months 7 through 9. The third trimester is a time when the unborn baby (fetus) is growing rapidly.  During the third trimester, your discomfort may increase as you and your baby continue to gain weight. You may have abdominal, leg, and back pain, sleeping problems, and an increased need to urinate.  During the third trimester your breasts will keep growing and they will continue to become tender. A yellow fluid (colostrum) may leak from your breasts. This is the first milk you are producing for your baby.  False labor is a condition in which you feel small, irregular tightenings of the muscles in the womb (contractions) that eventually go away. These are called Braxton Hicks contractions. Contractions may last for hours, days, or even weeks before true labor sets in.  Signs of labor can include: abdominal cramps; regular contractions that start at 10 minutes apart and become stronger and more frequent with time; watery or bloody mucus discharge that comes from the vagina; increased pelvic pressure and dull back pain; and leaking of amniotic fluid. This information is not intended to replace advice  given to you by your health care provider. Make sure you discuss any questions you have with your health care provider. Document Released: 05/30/2001 Document Revised: 11/11/2015 Document Reviewed: 08/06/2012 [employer] Interactive Patient Education  2017 [employer]   Breastfeeding Deciding to breastfeed is one of the best choices you can make for you and your baby. A change in hormones during pregnancy causes your breast tissue to grow and increases the number and size of your milk ducts. These hormones also allow proteins, sugars, and fats from your blood supply to make breast milk in your milk-producing glands. Hormones prevent breast milk from being released before your baby is born as well as prompt milk flow after birth. Once breastfeeding has begun, thoughts of your baby, as well as his or her sucking or crying, can stimulate the release of milk from your milk-producing glands. Benefits of breastfeeding For Your Baby  Your first milk (colostrum) helps your baby's digestive system function better.  There are antibodies in your milk that help your baby fight off infections.  Your baby has a lower incidence of asthma, allergies, and sudden infant death syndrome.  The nutrients in breast milk are better for your baby than infant formulas and are designed uniquely for your baby's needs.  Breast milk improves your baby's brain development.  Your baby is less likely to develop other conditions, such as childhood obesity, asthma, or type 2 diabetes mellitus.  For You  Breastfeeding helps to create a very special bond between you and your baby.  Breastfeeding is convenient. Breast milk is always available at the correct temperature and costs nothing.  Breastfeeding helps to burn calories and helps you lose the weight gained during pregnancy.  Breastfeeding makes your uterus contract to its prepregnancy size faster and slows bleeding (lochia) after you give birth.  Breastfeeding helps  to lower your risk of developing type 2 diabetes mellitus, osteoporosis, and breast or ovarian cancer later in life.  Signs that your baby is hungry Early Signs of Hunger    Increased alertness or activity.  Stretching.  Movement of the head from side to side.  Movement of the head and opening of the mouth when the corner of the mouth or cheek is stroked (rooting).  Increased sucking sounds, smacking lips, cooing, sighing, or squeaking.  Hand-to-mouth movements.  Increased sucking of fingers or hands.  Late Signs of Hunger  Fussing.  Intermittent crying.  Extreme Signs of Hunger Signs of extreme hunger will require calming and consoling before your baby will be able to breastfeed successfully. Do not wait for the following signs of extreme hunger to occur before you initiate breastfeeding:  Restlessness.  A loud, strong cry.  Screaming.  Breastfeeding basics Breastfeeding Initiation  Find a comfortable place to sit or lie down, with your neck and back well supported.  Place a pillow or rolled up blanket under your baby to bring him or her to the level of your breast (if you are seated). Nursing pillows are specially designed to help support your arms and your baby while you breastfeed.  Make sure that your baby's abdomen is facing your abdomen.  Gently massage your breast. With your fingertips, massage from your chest wall toward your nipple in a circular motion. This encourages milk flow. You may need to continue this action during the feeding if your milk flows slowly.  Support your breast with 4 fingers underneath and your thumb above your nipple. Make sure your fingers are well away from your nipple and your baby's mouth.  Stroke your baby's lips gently with your finger or nipple.  When your baby's mouth is open wide enough, quickly bring your baby to your breast, placing your entire nipple and as much of the colored area around your nipple (areola) as possible into  your baby's mouth. ? More areola should be visible above your baby's upper lip than below the lower lip. ? Your baby's tongue should be between his or her lower gum and your breast.  Ensure that your baby's mouth is correctly positioned around your nipple (latched). Your baby's lips should create a seal on your breast and be turned out (everted).  It is common for your baby to suck about 2-3 minutes in order to start the flow of breast milk.  Latching Teaching your baby how to latch on to your breast properly is very important. An improper latch can cause nipple pain and decreased milk supply for you and poor weight gain in your baby. Also, if your baby is not latched onto your nipple properly, he or she may swallow some air during feeding. This can make your baby fussy. Burping your baby when you switch breasts during the feeding can help to get rid of the air. However, teaching your baby to latch on properly is still the best way to prevent fussiness from swallowing air while breastfeeding. Signs that your baby has successfully latched on to your nipple:  Silent tugging or silent sucking, without causing you pain.  Swallowing heard between every 3-4 sucks.  Muscle movement above and in front of his or her ears while sucking.  Signs that your baby has not successfully latched on to nipple:  Sucking sounds or smacking sounds from your baby while breastfeeding.  Nipple pain.  If you think your baby has not latched on correctly, slip your finger into the corner of your baby's mouth to break the suction and place it between your baby's gums. Attempt breastfeeding initiation again. Signs of Successful Breastfeeding Signs from your baby:  A   gradual decrease in the number of sucks or complete cessation of sucking.  Falling asleep.  Relaxation of his or her body.  Retention of a small amount of milk in his or her mouth.  Letting go of your breast by himself or herself.  Signs from  you:  Breasts that have increased in firmness, weight, and size 1-3 hours after feeding.  Breasts that are softer immediately after breastfeeding.  Increased milk volume, as well as a change in milk consistency and color by the fifth day of breastfeeding.  Nipples that are not sore, cracked, or bleeding.  Signs That Your Baby is Getting Enough Milk  Wetting at least 1-2 diapers during the first 24 hours after birth.  Wetting at least 5-6 diapers every 24 hours for the first week after birth. The urine should be clear or pale yellow by 5 days after birth.  Wetting 6-8 diapers every 24 hours as your baby continues to grow and develop.  At least 3 stools in a 24-hour period by age 5 days. The stool should be soft and yellow.  At least 3 stools in a 24-hour period by age 7 days. The stool should be seedy and yellow.  No loss of weight greater than 10% of birth weight during the first 3 days of age.  Average weight gain of 4-7 ounces (113-198 g) per week after age 4 days.  Consistent daily weight gain by age 5 days, without weight loss after the age of 2 weeks.  After a feeding, your baby may spit up a small amount. This is common. Breastfeeding frequency and duration Frequent feeding will help you make more milk and can prevent sore nipples and breast engorgement. Breastfeed when you feel the need to reduce the fullness of your breasts or when your baby shows signs of hunger. This is called "breastfeeding on demand." Avoid introducing a pacifier to your baby while you are working to establish breastfeeding (the first 4-6 weeks after your baby is born). After this time you may choose to use a pacifier. Research has shown that pacifier use during the first year of a baby's life decreases the risk of sudden infant death syndrome (SIDS). Allow your baby to feed on each breast as long as he or she wants. Breastfeed until your baby is finished feeding. When your baby unlatches or falls asleep  while feeding from the first breast, offer the second breast. Because newborns are often sleepy in the first few weeks of life, you may need to awaken your baby to get him or her to feed. Breastfeeding times will vary from baby to baby. However, the following rules can serve as a guide to help you ensure that your baby is properly fed:  Newborns (babies 4 weeks of age or younger) may breastfeed every 1-3 hours.  Newborns should not go longer than 3 hours during the day or 5 hours during the night without breastfeeding.  You should breastfeed your baby a minimum of 8 times in a 24-hour period until you begin to introduce solid foods to your baby at around 6 months of age.  Breast milk pumping Pumping and storing breast milk allows you to ensure that your baby is exclusively fed your breast milk, even at times when you are unable to breastfeed. This is especially important if you are going back to work while you are still breastfeeding or when you are not able to be present during feedings. Your lactation consultant can give you guidelines on how   long it is safe to store breast milk. A breast pump is a machine that allows you to pump milk from your breast into a sterile bottle. The pumped breast milk can then be stored in a refrigerator or freezer. Some breast pumps are operated by hand, while others use electricity. Ask your lactation consultant which type will work best for you. Breast pumps can be purchased, but some hospitals and breastfeeding support groups lease breast pumps on a monthly basis. A lactation consultant can teach you how to hand express breast milk, if you prefer not to use a pump. Caring for your breasts while you breastfeed Nipples can become dry, cracked, and sore while breastfeeding. The following recommendations can help keep your breasts moisturized and healthy:  Avoid using soap on your nipples.  Wear a supportive bra. Although not required, special nursing bras and tank  tops are designed to allow access to your breasts for breastfeeding without taking off your entire bra or top. Avoid wearing underwire-style bras or extremely tight bras.  Air dry your nipples for 3-4minutes after each feeding.  Use only cotton bra pads to absorb leaked breast milk. Leaking of breast milk between feedings is normal.  Use lanolin on your nipples after breastfeeding. Lanolin helps to maintain your skin's normal moisture barrier. If you use pure lanolin, you do not need to wash it off before feeding your baby again. Pure lanolin is not toxic to your baby. You may also hand express a few drops of breast milk and gently massage that milk into your nipples and allow the milk to air dry.  In the first few weeks after giving birth, some women experience extremely full breasts (engorgement). Engorgement can make your breasts feel heavy, warm, and tender to the touch. Engorgement peaks within 3-5 days after you give birth. The following recommendations can help ease engorgement:  Completely empty your breasts while breastfeeding or pumping. You may want to start by applying warm, moist heat (in the shower or with warm water-soaked hand towels) just before feeding or pumping. This increases circulation and helps the milk flow. If your baby does not completely empty your breasts while breastfeeding, pump any extra milk after he or she is finished.  Wear a snug bra (nursing or regular) or tank top for 1-2 days to signal your body to slightly decrease milk production.  Apply ice packs to your breasts, unless this is too uncomfortable for you.  Make sure that your baby is latched on and positioned properly while breastfeeding.  If engorgement persists after 48 hours of following these recommendations, contact your health care provider or a lactation consultant. Overall health care recommendations while breastfeeding  Eat healthy foods. Alternate between meals and snacks, eating 3 of each per  day. Because what you eat affects your breast milk, some of the foods may make your baby more irritable than usual. Avoid eating these foods if you are sure that they are negatively affecting your baby.  Drink milk, fruit juice, and water to satisfy your thirst (about 10 glasses a day).  Rest often, relax, and continue to take your prenatal vitamins to prevent fatigue, stress, and anemia.  Continue breast self-awareness checks.  Avoid chewing and smoking tobacco. Chemicals from cigarettes that pass into breast milk and exposure to secondhand smoke may harm your baby.  Avoid alcohol and drug use, including marijuana. Some medicines that may be harmful to your baby can pass through breast milk. It is important to ask your health care   provider before taking any medicine, including all over-the-counter and prescription medicine as well as vitamin and herbal supplements. It is possible to become pregnant while breastfeeding. If birth control is desired, ask your health care provider about options that will be safe for your baby. Contact a health care provider if:  You feel like you want to stop breastfeeding or have become frustrated with breastfeeding.  You have painful breasts or nipples.  Your nipples are cracked or bleeding.  Your breasts are red, tender, or warm.  You have a swollen area on either breast.  You have a fever or chills.  You have nausea or vomiting.  You have drainage other than breast milk from your nipples.  Your breasts do not become full before feedings by the fifth day after you give birth.  You feel sad and depressed.  Your baby is too sleepy to eat well.  Your baby is having trouble sleeping.  Your baby is wetting less than 3 diapers in a 24-hour period.  Your baby has less than 3 stools in a 24-hour period.  Your baby's skin or the white part of his or her eyes becomes yellow.  Your baby is not gaining weight by 5 days of age. Get help right away  if:  Your baby is overly tired (lethargic) and does not want to wake up and feed.  Your baby develops an unexplained fever. This information is not intended to replace advice given to you by your health care provider. Make sure you discuss any questions you have with your health care provider. Document Released: 06/05/2005 Document Revised: 11/17/2015 Document Reviewed: 11/27/2012 Elsevier Interactive Patient Education  2017 Elsevier Inc.  

## 2016-10-09 NOTE — Progress Notes (Signed)
   PRENATAL VISIT NOTE  Subjective:  [name] [name] is a 31 y.o. G3P2002 at [redacted]w[redacted]d being seen today for ongoing prenatal care.  She is currently monitored for the following issues for this high-risk pregnancy and has Marijuana use; Tobacco abuse; Supervision of high risk pregnancy in second trimester; Hypertension in pregnancy, essential, antepartum; BMI 31.0-31.9,adult; Obesity in pregnancy, antepartum; and Gastroesophageal reflux disease without esophagitis on her problem list.  Patient reports no complaints.  Contractions: Not present. Vag. Bleeding: None.  Movement: Present. Denies leaking of fluid.   The following portions of the patient's history were reviewed and updated as appropriate: allergies, current medications, past family history, past medical history, past social history, past surgical history and problem list. Problem list updated.  Objective:   Vitals:   10/09/16 1120  BP: 130/84  Pulse: 85  Weight: 200 lb (90.7 kg)    Fetal Status: Fetal Heart Rate (bpm): 143 Fundal Height: 30 cm Movement: Present     General:  Alert, oriented and cooperative. Patient is in no acute distress.  Skin: Skin is warm and dry. No rash noted.   Cardiovascular: Normal heart rate noted  Respiratory: Normal respiratory effort, no problems with respiration noted  Abdomen: Soft, gravid, appropriate for gestational age. Pain/Pressure: Present     Pelvic:  Cervical exam deferred        Extremities: Normal range of motion.  Edema: None  Mental Status: Normal mood and affect. Normal behavior. Normal judgment and thought content.   Assessment and Plan:  Pregnancy: G3P2002 at [redacted]w[redacted]d  1. Hypertension in pregnancy, essential, antepartum Continue Norvasc and ASA - [name] MFM OB FOLLOW UP; Future  2. Supervision of high risk pregnancy in third trimester Continue prenatal care.   3. Gastroesophageal reflux disease without esophagitis Trial of Prilosec, protonix is not working. - omeprazole  (PRILOSEC OTC) 20 MG tablet; Take 1 tablet (20 mg total) by mouth daily.  Dispense: 30 tablet; Refill: 3  Preterm labor symptoms and general obstetric precautions including but not limited to vaginal bleeding, contractions, leaking of fluid and fetal movement were reviewed in detail with the patient. Please refer to After Visit Summary for other counseling recommendations.  Return in 2 weeks (on 10/23/2016) for OB visit and NST, HRC.   Donnamae Jude, MD

## 2016-10-12 ENCOUNTER — Encounter: Payer: Self-pay | Admitting: *Deleted

## 2016-10-19 ENCOUNTER — Ambulatory Visit (HOSPITAL_COMMUNITY)
Admission: RE | Admit: 2016-10-19 | Discharge: 2016-10-19 | Disposition: A | Discharge status: Home / Self Care | Payer: Medicaid Other | Source: Ambulatory Visit | Attending: Family Medicine | Admitting: Family Medicine

## 2016-10-19 ENCOUNTER — Encounter (HOSPITAL_COMMUNITY): Payer: Self-pay

## 2016-10-19 DIAGNOSIS — O0992 Supervision of high risk pregnancy, unspecified, second trimester: Secondary | ICD-10-CM

## 2016-10-19 DIAGNOSIS — Z3A31 31 weeks gestation of pregnancy: Secondary | ICD-10-CM | POA: Diagnosis not present

## 2016-10-19 DIAGNOSIS — O10019 Pre-existing essential hypertension complicating pregnancy, unspecified trimester: Secondary | ICD-10-CM

## 2016-10-19 DIAGNOSIS — O10013 Pre-existing essential hypertension complicating pregnancy, third trimester: Secondary | ICD-10-CM | POA: Insufficient documentation

## 2016-10-19 DIAGNOSIS — Z362 Encounter for other antenatal screening follow-up: Secondary | ICD-10-CM | POA: Insufficient documentation

## 2016-10-23 ENCOUNTER — Other Ambulatory Visit: Payer: Medicaid Other | Admitting: Obstetrics and Gynecology

## 2016-10-25 ENCOUNTER — Encounter: Payer: Self-pay | Admitting: General Practice

## 2016-10-25 ENCOUNTER — Ambulatory Visit (INDEPENDENT_AMBULATORY_CARE_PROVIDER_SITE_OTHER): Payer: Medicaid Other | Admitting: Obstetrics and Gynecology

## 2016-10-25 VITALS — BP 121/69 | HR 80 | Wt 202.3 lb

## 2016-10-25 DIAGNOSIS — O10019 Pre-existing essential hypertension complicating pregnancy, unspecified trimester: Secondary | ICD-10-CM

## 2016-10-25 DIAGNOSIS — O10013 Pre-existing essential hypertension complicating pregnancy, third trimester: Secondary | ICD-10-CM

## 2016-10-25 DIAGNOSIS — O0992 Supervision of high risk pregnancy, unspecified, second trimester: Secondary | ICD-10-CM | POA: Diagnosis not present

## 2016-10-25 LAB — POCT URINALYSIS DIP (DEVICE)
BILIRUBIN URINE: NEGATIVE
GLUCOSE, UA: NEGATIVE mg/dL
Hgb urine dipstick: NEGATIVE
KETONES UR: NEGATIVE mg/dL
NITRITE: NEGATIVE
Protein, ur: 30 mg/dL — AB
Specific Gravity, Urine: 1.02 (ref 1.005–1.030)
Urobilinogen, UA: 2 mg/dL — ABNORMAL HIGH (ref 0.0–1.0)
pH: 7 (ref 5.0–8.0)

## 2016-10-25 NOTE — Progress Notes (Signed)
    PRENATAL VISIT NOTE  Subjective:  [name] [name] is a 31 y.o. G3P2002 at [redacted]w[redacted]d being seen today for ongoing prenatal care.  She is currently monitored for the following issues for this high-risk pregnancy and has Marijuana use; Tobacco abuse; Supervision of high risk pregnancy in second trimester; Hypertension in pregnancy, essential, antepartum; BMI 31.0-31.9,adult; Obesity in pregnancy, antepartum; and Gastroesophageal reflux disease without esophagitis on her problem list.  Patient reports no complaints.  Contractions: Not present. Vag. Bleeding: None.  Movement: Present. Denies leaking of fluid.   The following portions of the patient's history were reviewed and updated as appropriate: allergies, current medications, past family history, past medical history, past social history, past surgical history and problem list. Problem list updated.  Objective:   Vitals:   10/25/16 1001  BP: 121/69  Pulse: 80  Weight: 202 lb 4.8 oz (91.8 kg)    Fetal Status: Fetal Heart Rate (bpm): NST   Movement: Present     General:  Alert, oriented and cooperative. Patient is in no acute distress.  Skin: Skin is warm and dry. No rash noted.   Cardiovascular: Normal heart rate noted  Respiratory: Normal respiratory effort, no problems with respiration noted  Abdomen: Soft, gravid, appropriate for gestational age. Pain/Pressure: Present     Pelvic:  Cervical exam deferred        Extremities: Normal range of motion.  Edema: None  Mental Status: Normal mood and affect. Normal behavior. Normal judgment and thought content.   Assessment and Plan:  Pregnancy: G3P2002 at [redacted]w[redacted]d  1. Hypertension in pregnancy, essential, antepartum BP well controlled. Continue Norvasc Growth ultrasound normal in early May. Follow up growth first week in June - [name] MFM OB FOLLOW UP; Future NST reviewed and reactive  2. Supervision of high risk pregnancy in second trimester Patient is doing well without complaints -  [name] MFM OB FOLLOW UP; Future  Preterm labor symptoms and general obstetric precautions including but not limited to vaginal bleeding, contractions, leaking of fluid and fetal movement were reviewed in detail with the patient. Please refer to After Visit Summary for other counseling recommendations.  Return in about 5 days (around 10/30/2016) for NST/AFI;  5/17  NST only; 5/21  NST/AFI;  5/24  Ob fu and NST.   Takerra Lupinacci, Vickii Chafe, MD

## 2016-10-30 ENCOUNTER — Ambulatory Visit (INDEPENDENT_AMBULATORY_CARE_PROVIDER_SITE_OTHER): Payer: Medicaid Other | Admitting: Obstetrics and Gynecology

## 2016-10-30 ENCOUNTER — Ambulatory Visit: Payer: Self-pay

## 2016-10-30 VITALS — BP 124/75 | HR 89

## 2016-10-30 DIAGNOSIS — O10013 Pre-existing essential hypertension complicating pregnancy, third trimester: Secondary | ICD-10-CM | POA: Diagnosis not present

## 2016-10-30 DIAGNOSIS — O10019 Pre-existing essential hypertension complicating pregnancy, unspecified trimester: Secondary | ICD-10-CM

## 2016-10-30 NOTE — Progress Notes (Signed)
Pt informed that the ultrasound is considered a limited OB ultrasound and is not intended to be a complete ultrasound exam.  Patient also informed that the ultrasound is not being completed with the intent of assessing for fetal or placental anomalies or any pelvic abnormalities.  Explained that the purpose of today's ultrasound is to assess for presentation and amniotic fluid volume.  Patient acknowledges the purpose of the exam and the limitations of the study.    

## 2016-10-30 NOTE — Progress Notes (Signed)
NST today:  Baseline: 130 bpm, no decels, 15x15 accels, moderate variability.  Noni Saupe I, NP 10/30/2016 6:03 PM

## 2016-11-02 ENCOUNTER — Encounter: Payer: Self-pay | Admitting: General Practice

## 2016-11-02 ENCOUNTER — Ambulatory Visit (INDEPENDENT_AMBULATORY_CARE_PROVIDER_SITE_OTHER): Payer: Medicaid Other | Admitting: Advanced Practice Midwife

## 2016-11-02 VITALS — BP 130/75 | HR 87

## 2016-11-02 DIAGNOSIS — O10919 Unspecified pre-existing hypertension complicating pregnancy, unspecified trimester: Secondary | ICD-10-CM

## 2016-11-02 NOTE — Progress Notes (Signed)
NST performed today was reviewed and was found to be reactive.  Continue recommended antenatal testing and prenatal care.  

## 2016-11-06 ENCOUNTER — Ambulatory Visit (INDEPENDENT_AMBULATORY_CARE_PROVIDER_SITE_OTHER): Payer: Medicaid Other | Admitting: Student

## 2016-11-06 ENCOUNTER — Ambulatory Visit: Payer: Self-pay

## 2016-11-06 ENCOUNTER — Encounter: Payer: Self-pay | Admitting: General Practice

## 2016-11-06 DIAGNOSIS — O169 Unspecified maternal hypertension, unspecified trimester: Secondary | ICD-10-CM

## 2016-11-06 DIAGNOSIS — O10913 Unspecified pre-existing hypertension complicating pregnancy, third trimester: Secondary | ICD-10-CM | POA: Diagnosis present

## 2016-11-06 DIAGNOSIS — O10919 Unspecified pre-existing hypertension complicating pregnancy, unspecified trimester: Secondary | ICD-10-CM

## 2016-11-06 MED ORDER — MONTELUKAST SODIUM 10 MG PO TABS: 10.0000 mg | ORAL_TABLET | Freq: Every day | ORAL | 2 refills | Status: DC

## 2016-11-06 MED ORDER — CETIRIZINE HCL 10 MG PO TABS: 10.0000 mg | ORAL_TABLET | Freq: Every day | ORAL | 2 refills | Status: DC

## 2016-11-06 MED ORDER — AMLODIPINE BESYLATE 10 MG PO TABS: 10.0000 mg | ORAL_TABLET | Freq: Every day | ORAL | 2 refills | Status: DC

## 2016-11-06 NOTE — Progress Notes (Signed)
Pt requests refill of Singulair, Amlodipine and Cetirizine - sent as requested.

## 2016-11-06 NOTE — Progress Notes (Signed)
Reactive NST 

## 2016-11-09 ENCOUNTER — Ambulatory Visit (INDEPENDENT_AMBULATORY_CARE_PROVIDER_SITE_OTHER): Payer: Medicaid Other | Admitting: Obstetrics and Gynecology

## 2016-11-09 VITALS — BP 126/74 | HR 82 | Wt 205.9 lb

## 2016-11-09 DIAGNOSIS — E669 Obesity, unspecified: Secondary | ICD-10-CM | POA: Diagnosis not present

## 2016-11-09 DIAGNOSIS — O0992 Supervision of high risk pregnancy, unspecified, second trimester: Secondary | ICD-10-CM | POA: Diagnosis not present

## 2016-11-09 DIAGNOSIS — O99213 Obesity complicating pregnancy, third trimester: Secondary | ICD-10-CM | POA: Diagnosis not present

## 2016-11-09 DIAGNOSIS — O10019 Pre-existing essential hypertension complicating pregnancy, unspecified trimester: Secondary | ICD-10-CM

## 2016-11-09 DIAGNOSIS — O99210 Obesity complicating pregnancy, unspecified trimester: Secondary | ICD-10-CM

## 2016-11-09 DIAGNOSIS — O10913 Unspecified pre-existing hypertension complicating pregnancy, third trimester: Secondary | ICD-10-CM

## 2016-11-09 DIAGNOSIS — O10919 Unspecified pre-existing hypertension complicating pregnancy, unspecified trimester: Secondary | ICD-10-CM

## 2016-11-09 LAB — POCT URINALYSIS DIP (DEVICE)
Bilirubin Urine: NEGATIVE
GLUCOSE, UA: NEGATIVE mg/dL
Hgb urine dipstick: NEGATIVE
KETONES UR: NEGATIVE mg/dL
NITRITE: NEGATIVE
Protein, ur: NEGATIVE mg/dL
Specific Gravity, Urine: 1.01 (ref 1.005–1.030)
UROBILINOGEN UA: 0.2 mg/dL (ref 0.0–1.0)
pH: 6.5 (ref 5.0–8.0)

## 2016-11-09 NOTE — Progress Notes (Signed)
   PRENATAL VISIT NOTE  Subjective:  [name] [name] is a 31 y.o. G3P2002 at [redacted]w[redacted]d being seen today for ongoing prenatal care.  She is currently monitored for the following issues for this high-risk pregnancy and has Marijuana use; Tobacco abuse; Supervision of high risk pregnancy in second trimester; Hypertension in pregnancy, essential, antepartum; BMI 31.0-31.9,adult; Obesity in pregnancy, antepartum; and Gastroesophageal reflux disease without esophagitis on her problem list.  Patient reports no complaints.  Contractions: Irregular. Vag. Bleeding: None.  Movement: Present. Denies leaking of fluid.   The following portions of the patient's history were reviewed and updated as appropriate: allergies, current medications, past family history, past medical history, past social history, past surgical history and problem list. Problem list updated.  Objective:   Vitals:   11/09/16 1309  BP: 126/74  Pulse: 82  Weight: 205 lb 14.4 oz (93.4 kg)    Fetal Status: Fetal Heart Rate (bpm): NST   Movement: Present     General:  Alert, oriented and cooperative. Patient is in no acute distress.  Skin: Skin is warm and dry. No rash noted.   Cardiovascular: Normal heart rate noted  Respiratory: Normal respiratory effort, no problems with respiration noted  Abdomen: Soft, gravid, appropriate for gestational age. Pain/Pressure: Present     Pelvic:  Cervical exam deferred        Extremities: Normal range of motion.  Edema: None  Mental Status: Normal mood and affect. Normal behavior. Normal judgment and thought content.   Assessment and Plan:  Pregnancy: G3P2002 at [redacted]w[redacted]d  1. Chronic hypertension affecting pregnancy BP well controlled on labetalol Continue current dose Follow up growth ultrasound ordered Plan for IOL at 39 weeks - Fetal nonstress test- NST reviewed and reactive - [name] MFM OB FOLLOW UP; Future  2. Supervision of high risk pregnancy in second trimester Patient is doing well  without complaints - [name] MFM OB FOLLOW UP; Future  3. Hypertension in pregnancy, essential, antepartum   4. Obesity in pregnancy, antepartum   Preterm labor symptoms and general obstetric precautions including but not limited to vaginal bleeding, contractions, leaking of fluid and fetal movement were reviewed in detail with the patient. Please refer to After Visit Summary for other counseling recommendations.  Return in about 5 days (around 11/14/2016) for 2x/wk as scheduled.   Mora Bellman, MD

## 2016-11-14 ENCOUNTER — Ambulatory Visit (INDEPENDENT_AMBULATORY_CARE_PROVIDER_SITE_OTHER): Payer: Medicaid Other | Admitting: Family Medicine

## 2016-11-14 ENCOUNTER — Ambulatory Visit: Payer: Self-pay

## 2016-11-14 VITALS — BP 134/79 | HR 90

## 2016-11-14 DIAGNOSIS — O10913 Unspecified pre-existing hypertension complicating pregnancy, third trimester: Secondary | ICD-10-CM | POA: Diagnosis not present

## 2016-11-14 DIAGNOSIS — O10919 Unspecified pre-existing hypertension complicating pregnancy, unspecified trimester: Secondary | ICD-10-CM

## 2016-11-14 NOTE — Progress Notes (Signed)
Pt informed that the ultrasound is considered a limited OB ultrasound and is not intended to be a complete ultrasound exam.  Patient also informed that the ultrasound is not being completed with the intent of assessing for fetal or placental anomalies or any pelvic abnormalities.  Explained that the purpose of today's ultrasound is to assess for amniotic fluid volume and presentation.  Patient acknowledges the purpose of the exam and the limitations of the study.    [name] for growth scheduled 6/25 @ 1115

## 2016-11-14 NOTE — Progress Notes (Signed)
I personally reviewed the patient's NST today, found to be REACTIVE. 135 bpm, mod var, +accels, no decels. CTX: None.  AFI within normal limits. Vertex presentation.  Zenda Alpers, DO  OB Fellow Center for [facility], [facility]

## 2016-11-16 ENCOUNTER — Ambulatory Visit (INDEPENDENT_AMBULATORY_CARE_PROVIDER_SITE_OTHER): Payer: Medicaid Other | Admitting: Obstetrics and Gynecology

## 2016-11-16 VITALS — BP 127/77 | HR 91 | Wt 207.9 lb

## 2016-11-16 DIAGNOSIS — O0992 Supervision of high risk pregnancy, unspecified, second trimester: Secondary | ICD-10-CM

## 2016-11-16 DIAGNOSIS — O10019 Pre-existing essential hypertension complicating pregnancy, unspecified trimester: Secondary | ICD-10-CM

## 2016-11-16 DIAGNOSIS — O10012 Pre-existing essential hypertension complicating pregnancy, second trimester: Secondary | ICD-10-CM | POA: Diagnosis not present

## 2016-11-16 LAB — POCT URINALYSIS DIP (DEVICE)
Bilirubin Urine: NEGATIVE
GLUCOSE, UA: NEGATIVE mg/dL
Hgb urine dipstick: NEGATIVE
Ketones, ur: NEGATIVE mg/dL
NITRITE: NEGATIVE
PH: 6.5 (ref 5.0–8.0)
Protein, ur: NEGATIVE mg/dL
Specific Gravity, Urine: 1.01 (ref 1.005–1.030)
Urobilinogen, UA: 1 mg/dL (ref 0.0–1.0)

## 2016-11-16 NOTE — Progress Notes (Signed)
Prenatal Visit Note Date: 11/16/2016 Clinic: [facility]  Subjective:  [name] [name] is a 31 y.o. [id number] at [redacted]w[redacted]d being seen today for ongoing prenatal care.  She is currently monitored for the following issues for this high-risk pregnancy and has Marijuana use; Tobacco abuse; Supervision of high risk pregnancy in second trimester; Hypertension in pregnancy, essential, antepartum; BMI 31.0-31.9,adult; Obesity in pregnancy, antepartum; and Gastroesophageal reflux disease without esophagitis on her problem list.  Patient reports no complaints.   Contractions: Irritability. Vag. Bleeding: None.  Movement: Present. Denies leaking of fluid.   The following portions of the patient's history were reviewed and updated as appropriate: allergies, current medications, past family history, past medical history, past social history, past surgical history and problem list. Problem list updated.  Objective:   Vitals:   11/16/16 1254  BP: 127/77  Pulse: 91  Weight: 207 lb 14.4 oz (94.3 kg)    Fetal Status: Fetal Heart Rate (bpm): NST`   Movement: Present  Presentation: Vertex  General:  Alert, oriented and cooperative. Patient is in no acute distress.  Skin: Skin is warm and dry. No rash noted.   Cardiovascular: Normal heart rate noted  Respiratory: Normal respiratory effort, no problems with respiration noted  Abdomen: Soft, gravid, appropriate for gestational age. Pain/Pressure: Present     Pelvic:  Cervical exam deferred        Extremities: Normal range of motion.  Edema: None  Mental Status: Normal mood and affect. Normal behavior. Normal judgment and thought content.   Urinalysis:      Assessment and Plan:  Pregnancy: G3P2002 at [redacted]w[redacted]d  1. Supervision of high risk pregnancy in second trimester Routine care. BTL papers already signed. GBS nv  2. Hypertension in pregnancy, essential, antepartum rNST today. Normal afi last visit and normal growth on 5/3. Rpt already  scheduled. Doing well on norvasc 10 qday.  - Fetal nonstress test  Preterm labor symptoms and general obstetric precautions including but not limited to vaginal bleeding, contractions, leaking of fluid and fetal movement were reviewed in detail with the patient. Please refer to After Visit Summary for other counseling recommendations.  Return in about 7 days (around 11/23/2016) for please change 6/8 appt to 6/7 @ 0840 if possible.   Aletha Halim, MD

## 2016-11-16 NOTE — Progress Notes (Signed)
Educated pt on [place]

## 2016-11-20 ENCOUNTER — Ambulatory Visit (INDEPENDENT_AMBULATORY_CARE_PROVIDER_SITE_OTHER): Payer: Medicaid Other | Admitting: Obstetrics & Gynecology

## 2016-11-20 ENCOUNTER — Ambulatory Visit: Payer: Self-pay

## 2016-11-20 DIAGNOSIS — O10913 Unspecified pre-existing hypertension complicating pregnancy, third trimester: Secondary | ICD-10-CM

## 2016-11-20 DIAGNOSIS — O10919 Unspecified pre-existing hypertension complicating pregnancy, unspecified trimester: Secondary | ICD-10-CM

## 2016-11-20 NOTE — Progress Notes (Signed)
Pt informed that the ultrasound is considered a limited OB ultrasound and is not intended to be a complete ultrasound exam.  Patient also informed that the ultrasound is not being completed with the intent of assessing for fetal or placental anomalies or any pelvic abnormalities.  Explained that the purpose of today's ultrasound is to assess for presentation and amniotic fluid volume.  Patient acknowledges the purpose of the exam and the limitations of the study.    

## 2016-11-20 NOTE — Progress Notes (Signed)
NST reviewed and reactive.  Swati Granberry L. Harraway-Smith, M.D., FACOG    

## 2016-11-23 ENCOUNTER — Other Ambulatory Visit (HOSPITAL_COMMUNITY)
Admission: RE | Admit: 2016-11-23 | Discharge: 2016-11-23 | Disposition: A | Discharge status: Home / Self Care | Payer: Medicaid Other | Source: Ambulatory Visit | Attending: Obstetrics and Gynecology | Admitting: Obstetrics and Gynecology

## 2016-11-23 ENCOUNTER — Ambulatory Visit (INDEPENDENT_AMBULATORY_CARE_PROVIDER_SITE_OTHER): Payer: Medicaid Other | Admitting: Obstetrics and Gynecology

## 2016-11-23 ENCOUNTER — Other Ambulatory Visit: Payer: Medicaid Other | Admitting: Obstetrics and Gynecology

## 2016-11-23 VITALS — BP 119/74 | HR 88 | Wt 210.6 lb

## 2016-11-23 DIAGNOSIS — O10919 Unspecified pre-existing hypertension complicating pregnancy, unspecified trimester: Secondary | ICD-10-CM

## 2016-11-23 DIAGNOSIS — Z113 Encounter for screening for infections with a predominantly sexual mode of transmission: Secondary | ICD-10-CM | POA: Diagnosis not present

## 2016-11-23 DIAGNOSIS — O10913 Unspecified pre-existing hypertension complicating pregnancy, third trimester: Secondary | ICD-10-CM | POA: Diagnosis present

## 2016-11-23 DIAGNOSIS — O10019 Pre-existing essential hypertension complicating pregnancy, unspecified trimester: Secondary | ICD-10-CM

## 2016-11-23 DIAGNOSIS — O0993 Supervision of high risk pregnancy, unspecified, third trimester: Secondary | ICD-10-CM | POA: Diagnosis not present

## 2016-11-23 DIAGNOSIS — Z3A36 36 weeks gestation of pregnancy: Secondary | ICD-10-CM | POA: Insufficient documentation

## 2016-11-23 DIAGNOSIS — O0992 Supervision of high risk pregnancy, unspecified, second trimester: Secondary | ICD-10-CM

## 2016-11-23 LAB — POCT URINALYSIS DIP (DEVICE)
Bilirubin Urine: NEGATIVE
GLUCOSE, UA: NEGATIVE mg/dL
Hgb urine dipstick: NEGATIVE
Ketones, ur: NEGATIVE mg/dL
NITRITE: NEGATIVE
Protein, ur: NEGATIVE mg/dL
Specific Gravity, Urine: 1.01 (ref 1.005–1.030)
UROBILINOGEN UA: 0.2 mg/dL (ref 0.0–1.0)
pH: 6 (ref 5.0–8.0)

## 2016-11-23 LAB — OB RESULTS CONSOLE GC/CHLAMYDIA: Gonorrhea: NEGATIVE

## 2016-11-23 LAB — OB RESULTS CONSOLE GBS: GBS: NEGATIVE

## 2016-11-23 NOTE — Progress Notes (Signed)
   PRENATAL VISIT NOTE  Subjective:  [name] [name] is a 31 y.o. G3P2002 at [redacted]w[redacted]d being seen today for ongoing prenatal care.  She is currently monitored for the following issues for this high-risk pregnancy and has Marijuana use; Tobacco abuse; Supervision of high risk pregnancy in second trimester; Hypertension in pregnancy, essential, antepartum; BMI 31.0-31.9,adult; Obesity in pregnancy, antepartum; and Gastroesophageal reflux disease without esophagitis on her problem list.  Patient reports no complaints.  Contractions: Irregular.  .  Movement: Present. Denies leaking of fluid.   The following portions of the patient's history were reviewed and updated as appropriate: allergies, current medications, past family history, past medical history, past social history, past surgical history and problem list. Problem list updated.  Objective:   Vitals:   11/23/16 1405  BP: 119/74  Pulse: 88  Weight: 210 lb 9.6 oz (95.5 kg)    Fetal Status: Fetal Heart Rate (bpm): NST   Movement: Present     General:  Alert, oriented and cooperative. Patient is in no acute distress.  Skin: Skin is warm and dry. No rash noted.   Cardiovascular: Normal heart rate noted  Respiratory: Normal respiratory effort, no problems with respiration noted  Abdomen: Soft, gravid, appropriate for gestational age. Pain/Pressure: Present     Pelvic:  Cervical exam performed Dilation: 1 Effacement (%): Thick Station: Ballotable  Extremities: Normal range of motion.  Edema: None  Mental Status: Normal mood and affect. Normal behavior. Normal judgment and thought content.   Assessment and Plan:  Pregnancy: G3P2002 at [redacted]w[redacted]d  1. Chronic hypertension affecting pregnancy Normal BP Continue amlodipine Follow up growth ultrasound scheduled Will plan for IOL at 39 weeks - Fetal nonstress test- NST reviewed and reactive  2. Supervision of high risk pregnancy in second trimester Patient is doing well without  complaints Cultures today - GC/Chlamydia probe amp ([place])not at [facility] - Strep Gp B NAA  3. Hypertension in pregnancy, essential, antepartum   Preterm labor symptoms and general obstetric precautions including but not limited to vaginal bleeding, contractions, leaking of fluid and fetal movement were reviewed in detail with the patient. Please refer to After Visit Summary for other counseling recommendations.  Return in about 1 week (around 11/30/2016) for 2x/wk as scheduled, ROB.   Mora Bellman, MD

## 2016-11-23 NOTE — Progress Notes (Signed)
[name] for growth scheduled 6/25.

## 2016-11-24 ENCOUNTER — Other Ambulatory Visit: Payer: Medicaid Other | Admitting: Family Medicine

## 2016-11-27 ENCOUNTER — Ambulatory Visit (INDEPENDENT_AMBULATORY_CARE_PROVIDER_SITE_OTHER): Payer: Medicaid Other | Admitting: Obstetrics & Gynecology

## 2016-11-27 ENCOUNTER — Ambulatory Visit: Payer: Self-pay

## 2016-11-27 VITALS — BP 129/73 | HR 85

## 2016-11-27 DIAGNOSIS — O10919 Unspecified pre-existing hypertension complicating pregnancy, unspecified trimester: Secondary | ICD-10-CM

## 2016-11-27 DIAGNOSIS — O10913 Unspecified pre-existing hypertension complicating pregnancy, third trimester: Secondary | ICD-10-CM | POA: Diagnosis present

## 2016-11-27 LAB — CULTURE, BETA STREP (GROUP B ONLY): STREP GP B CULTURE: NEGATIVE

## 2016-11-27 LAB — GC/CHLAMYDIA PROBE AMP (~~LOC~~) NOT AT ARMC
CHLAMYDIA, DNA PROBE: NEGATIVE
NEISSERIA GONORRHEA: NEGATIVE

## 2016-11-27 NOTE — Progress Notes (Signed)
Pt informed that the ultrasound is considered a limited OB ultrasound and is not intended to be a complete ultrasound exam.  Patient also informed that the ultrasound is not being completed with the intent of assessing for fetal or placental anomalies or any pelvic abnormalities.  Explained that the purpose of today's ultrasound is to assess for presentation and amniotic fluid volume.  Patient acknowledges the purpose of the exam and the limitations of the study.    

## 2016-11-30 ENCOUNTER — Ambulatory Visit (INDEPENDENT_AMBULATORY_CARE_PROVIDER_SITE_OTHER): Payer: Medicaid Other | Admitting: Obstetrics and Gynecology

## 2016-11-30 VITALS — BP 130/70 | HR 84 | Wt 212.0 lb

## 2016-11-30 DIAGNOSIS — O0993 Supervision of high risk pregnancy, unspecified, third trimester: Secondary | ICD-10-CM | POA: Diagnosis not present

## 2016-11-30 DIAGNOSIS — O10919 Unspecified pre-existing hypertension complicating pregnancy, unspecified trimester: Secondary | ICD-10-CM

## 2016-11-30 DIAGNOSIS — O0992 Supervision of high risk pregnancy, unspecified, second trimester: Secondary | ICD-10-CM

## 2016-11-30 DIAGNOSIS — O0990 Supervision of high risk pregnancy, unspecified, unspecified trimester: Secondary | ICD-10-CM

## 2016-11-30 DIAGNOSIS — O10913 Unspecified pre-existing hypertension complicating pregnancy, third trimester: Secondary | ICD-10-CM

## 2016-11-30 DIAGNOSIS — O99210 Obesity complicating pregnancy, unspecified trimester: Secondary | ICD-10-CM

## 2016-11-30 DIAGNOSIS — O99213 Obesity complicating pregnancy, third trimester: Secondary | ICD-10-CM | POA: Diagnosis not present

## 2016-11-30 DIAGNOSIS — E669 Obesity, unspecified: Secondary | ICD-10-CM

## 2016-11-30 DIAGNOSIS — O10019 Pre-existing essential hypertension complicating pregnancy, unspecified trimester: Secondary | ICD-10-CM

## 2016-11-30 LAB — POCT URINALYSIS DIP (DEVICE)
BILIRUBIN URINE: NEGATIVE
Glucose, UA: NEGATIVE mg/dL
HGB URINE DIPSTICK: NEGATIVE
KETONES UR: NEGATIVE mg/dL
Nitrite: NEGATIVE
Protein, ur: NEGATIVE mg/dL
UROBILINOGEN UA: 0.2 mg/dL (ref 0.0–1.0)
pH: 5.5 (ref 5.0–8.0)

## 2016-11-30 NOTE — Progress Notes (Signed)
[name] for growth scheduled 6/25

## 2016-11-30 NOTE — Progress Notes (Signed)
Prenatal Visit Note Date: 11/30/2016 Clinic: [facility]  Subjective:  [name] [name] is a 31 y.o. [id number] at [redacted]w[redacted]d being seen today for ongoing prenatal care.  She is currently monitored for the following issues for this high-risk pregnancy and has Marijuana use; Tobacco abuse; Supervision of high risk pregnancy in second trimester; Hypertension in pregnancy, essential, antepartum; BMI 31.0-31.9,adult; Obesity in pregnancy, antepartum; and Gastroesophageal reflux disease without esophagitis on her problem list.  Patient reports no complaints.   Contractions: Irregular. Vag. Bleeding: None.  Movement: Present. Denies leaking of fluid.   The following portions of the patient's history were reviewed and updated as appropriate: allergies, current medications, past family history, past medical history, past social history, past surgical history and problem list. Problem list updated.  Objective:   Vitals:   11/30/16 1459  BP: 130/70  Pulse: 84  Weight: 212 lb (96.2 kg)    Fetal Status: Fetal Heart Rate (bpm): NST   Movement: Present  Presentation: Vertex  General:  Alert, oriented and cooperative. Patient is in no acute distress.  Skin: Skin is warm and dry. No rash noted.   Cardiovascular: Normal heart rate noted  Respiratory: Normal respiratory effort, no problems with respiration noted  Abdomen: Soft, gravid, appropriate for gestational age. Pain/Pressure: Present     Pelvic:  Cervical exam deferred        Extremities: Normal range of motion.  Edema: None  Mental Status: Normal mood and affect. Normal behavior. Normal judgment and thought content.   Urinalysis: Urine Protein: Negative Urine Glucose: Negative  Assessment and Plan:  Pregnancy: G3P2002 at [redacted]w[redacted]d  1. Chronic hypertension affecting pregnancy Continue norvasc 10 qday. rNST today 135 baseline, +accels, no decels, mod variability, toco quiet. Continue with 2x/week testing. Growth u/s scheduled for  later this month. Set up 39wk IOL nv.  - Fetal nonstress test  2. Supervision of high risk pregnancy, antepartum See above   Term labor symptoms and general obstetric precautions including but not limited to vaginal bleeding, contractions, leaking of fluid and fetal movement were reviewed in detail with the patient. Please refer to After Visit Summary for other counseling recommendations.  Return for continue with 2x/week testing.   Aletha Halim, MD

## 2016-12-04 ENCOUNTER — Ambulatory Visit (INDEPENDENT_AMBULATORY_CARE_PROVIDER_SITE_OTHER): Payer: Medicaid Other | Admitting: *Deleted

## 2016-12-04 ENCOUNTER — Ambulatory Visit: Payer: Self-pay

## 2016-12-04 DIAGNOSIS — O10919 Unspecified pre-existing hypertension complicating pregnancy, unspecified trimester: Secondary | ICD-10-CM

## 2016-12-04 DIAGNOSIS — O10913 Unspecified pre-existing hypertension complicating pregnancy, third trimester: Secondary | ICD-10-CM

## 2016-12-04 LAB — FETAL NONSTRESS TEST

## 2016-12-05 NOTE — Progress Notes (Signed)
Pt informed that the ultrasound is considered a limited OB ultrasound and is not intended to be a complete ultrasound exam.  Patient also informed that the ultrasound is not being completed with the intent of assessing for fetal or placental anomalies or any pelvic abnormalities.  Explained that the purpose of today's ultrasound is to assess for presentation and amniotic fluid volume.  Patient acknowledges the purpose of the exam and the limitations of the study.    

## 2016-12-07 ENCOUNTER — Encounter (HOSPITAL_COMMUNITY): Payer: Self-pay | Admitting: *Deleted

## 2016-12-07 ENCOUNTER — Telehealth (HOSPITAL_COMMUNITY): Payer: Self-pay | Admitting: *Deleted

## 2016-12-07 ENCOUNTER — Ambulatory Visit (INDEPENDENT_AMBULATORY_CARE_PROVIDER_SITE_OTHER): Payer: Medicaid Other | Admitting: Obstetrics and Gynecology

## 2016-12-07 VITALS — BP 132/79 | HR 86 | Wt 214.3 lb

## 2016-12-07 DIAGNOSIS — O10019 Pre-existing essential hypertension complicating pregnancy, unspecified trimester: Secondary | ICD-10-CM

## 2016-12-07 DIAGNOSIS — Z1389 Encounter for screening for other disorder: Secondary | ICD-10-CM

## 2016-12-07 DIAGNOSIS — O10013 Pre-existing essential hypertension complicating pregnancy, third trimester: Secondary | ICD-10-CM

## 2016-12-07 DIAGNOSIS — O0993 Supervision of high risk pregnancy, unspecified, third trimester: Secondary | ICD-10-CM | POA: Diagnosis present

## 2016-12-07 DIAGNOSIS — Z331 Pregnant state, incidental: Secondary | ICD-10-CM

## 2016-12-07 DIAGNOSIS — O0990 Supervision of high risk pregnancy, unspecified, unspecified trimester: Secondary | ICD-10-CM

## 2016-12-07 DIAGNOSIS — O0992 Supervision of high risk pregnancy, unspecified, second trimester: Secondary | ICD-10-CM

## 2016-12-07 DIAGNOSIS — O10919 Unspecified pre-existing hypertension complicating pregnancy, unspecified trimester: Secondary | ICD-10-CM

## 2016-12-07 LAB — POCT URINALYSIS DIP (DEVICE)
BILIRUBIN URINE: NEGATIVE
GLUCOSE, UA: NEGATIVE mg/dL
Hgb urine dipstick: NEGATIVE
KETONES UR: NEGATIVE mg/dL
NITRITE: NEGATIVE
Protein, ur: 30 mg/dL — AB
Specific Gravity, Urine: 1.01 (ref 1.005–1.030)
Urobilinogen, UA: 0.2 mg/dL (ref 0.0–1.0)
pH: 6.5 (ref 5.0–8.0)

## 2016-12-07 NOTE — Progress Notes (Signed)
   PRENATAL VISIT NOTE  Subjective:  [name] [name] is a 31 y.o. G3P2002 at [redacted]w[redacted]d being seen today for ongoing prenatal care.  She is currently monitored for the following issues for this high-risk pregnancy and has Marijuana use; Tobacco abuse; Supervision of high risk pregnancy in second trimester; Hypertension in pregnancy, essential, antepartum; BMI 31.0-31.9,adult; Obesity in pregnancy, antepartum; and Gastroesophageal reflux disease without esophagitis on her problem list.  Patient reports no complaints.  Contractions: Irregular. Vag. Bleeding: None.  Movement: Present. Denies leaking of fluid.   The following portions of the patient's history were reviewed and updated as appropriate: allergies, current medications, past family history, past medical history, past social history, past surgical history and problem list. Problem list updated.  Objective:   Vitals:   12/07/16 1259  BP: 132/79  Pulse: 86  Weight: 214 lb 4.8 oz (97.2 kg)    Fetal Status: Fetal Heart Rate (bpm): NST   Movement: Present     General:  Alert, oriented and cooperative. Patient is in no acute distress.  Skin: Skin is warm and dry. No rash noted.   Cardiovascular: Normal heart rate noted  Respiratory: Normal respiratory effort, no problems with respiration noted  Abdomen: Soft, gravid, appropriate for gestational age. Pain/Pressure: Present     Pelvic:  Cervical exam deferred        Extremities: Normal range of motion.  Edema: None  Mental Status: Normal mood and affect. Normal behavior. Normal judgment and thought content.   Assessment and Plan:  Pregnancy: G3P2002 at [redacted]w[redacted]d  1. Chronic hypertension affecting pregnancy Continue Norvasc - Fetal nonstress test- NST reviewed and reactive Follow up growth ultrasound on 6/25 IOL scheduled on 6/28  2. Supervision of high risk pregnancy, antepartum   3. Supervision of high risk pregnancy in second trimester Patient is doing well without  complaints  4. Hypertension in pregnancy, essential, antepartum   Term labor symptoms and general obstetric precautions including but not limited to vaginal bleeding, contractions, leaking of fluid and fetal movement were reviewed in detail with the patient. Please refer to After Visit Summary for other counseling recommendations.  Return in about 6 weeks (around 01/18/2017) for PP visit.  IOL on 6/28.   Mora Bellman, MD

## 2016-12-07 NOTE — Telephone Encounter (Signed)
Preadmission screen  

## 2016-12-07 NOTE — Progress Notes (Signed)
[name] for growth scheduled 6/25.  IOL scheduled 6/28 @ 0730.

## 2016-12-08 ENCOUNTER — Other Ambulatory Visit: Payer: Self-pay | Admitting: Advanced Practice Midwife

## 2016-12-08 ENCOUNTER — Encounter: Payer: Self-pay | Admitting: *Deleted

## 2016-12-11 ENCOUNTER — Ambulatory Visit (INDEPENDENT_AMBULATORY_CARE_PROVIDER_SITE_OTHER): Payer: Medicaid Other | Admitting: *Deleted

## 2016-12-11 ENCOUNTER — Ambulatory Visit (HOSPITAL_COMMUNITY)
Admission: RE | Admit: 2016-12-11 | Discharge: 2016-12-11 | Disposition: A | Discharge status: Home / Self Care | Payer: Medicaid Other | Source: Ambulatory Visit | Attending: Obstetrics and Gynecology | Admitting: Obstetrics and Gynecology

## 2016-12-11 ENCOUNTER — Encounter (HOSPITAL_COMMUNITY): Payer: Self-pay

## 2016-12-11 VITALS — BP 132/76 | HR 90

## 2016-12-11 DIAGNOSIS — Z3A38 38 weeks gestation of pregnancy: Secondary | ICD-10-CM | POA: Diagnosis not present

## 2016-12-11 DIAGNOSIS — O10913 Unspecified pre-existing hypertension complicating pregnancy, third trimester: Secondary | ICD-10-CM

## 2016-12-11 DIAGNOSIS — O0992 Supervision of high risk pregnancy, unspecified, second trimester: Secondary | ICD-10-CM

## 2016-12-11 DIAGNOSIS — O10019 Pre-existing essential hypertension complicating pregnancy, unspecified trimester: Secondary | ICD-10-CM

## 2016-12-11 DIAGNOSIS — O10919 Unspecified pre-existing hypertension complicating pregnancy, unspecified trimester: Secondary | ICD-10-CM

## 2016-12-11 DIAGNOSIS — O0993 Supervision of high risk pregnancy, unspecified, third trimester: Secondary | ICD-10-CM | POA: Insufficient documentation

## 2016-12-14 ENCOUNTER — Other Ambulatory Visit: Payer: Medicaid Other | Admitting: Obstetrics and Gynecology

## 2016-12-14 ENCOUNTER — Encounter (HOSPITAL_COMMUNITY): Payer: Self-pay

## 2016-12-14 ENCOUNTER — Inpatient Hospital Stay (HOSPITAL_COMMUNITY)
Admission: RE | Admit: 2016-12-14 | Discharge: 2016-12-17 | DRG: 767 | Disposition: A | Discharge status: Home / Self Care | Payer: Medicaid Other | Source: Ambulatory Visit | Attending: Obstetrics and Gynecology | Admitting: Obstetrics and Gynecology

## 2016-12-14 VITALS — BP 125/71 | HR 80 | Temp 98.0°F | Resp 20 | Ht 67.0 in | Wt 217.0 lb

## 2016-12-14 DIAGNOSIS — J45909 Unspecified asthma, uncomplicated: Secondary | ICD-10-CM | POA: Diagnosis present

## 2016-12-14 DIAGNOSIS — O99334 Smoking (tobacco) complicating childbirth: Secondary | ICD-10-CM | POA: Diagnosis present

## 2016-12-14 DIAGNOSIS — Z3A39 39 weeks gestation of pregnancy: Secondary | ICD-10-CM | POA: Diagnosis not present

## 2016-12-14 DIAGNOSIS — O1092 Unspecified pre-existing hypertension complicating childbirth: Secondary | ICD-10-CM | POA: Diagnosis present

## 2016-12-14 DIAGNOSIS — O9952 Diseases of the respiratory system complicating childbirth: Secondary | ICD-10-CM | POA: Diagnosis present

## 2016-12-14 DIAGNOSIS — Z302 Encounter for sterilization: Secondary | ICD-10-CM | POA: Diagnosis not present

## 2016-12-14 DIAGNOSIS — O10019 Pre-existing essential hypertension complicating pregnancy, unspecified trimester: Secondary | ICD-10-CM

## 2016-12-14 DIAGNOSIS — O1002 Pre-existing essential hypertension complicating childbirth: Secondary | ICD-10-CM | POA: Diagnosis present

## 2016-12-14 DIAGNOSIS — F17210 Nicotine dependence, cigarettes, uncomplicated: Secondary | ICD-10-CM | POA: Diagnosis present

## 2016-12-14 DIAGNOSIS — O0992 Supervision of high risk pregnancy, unspecified, second trimester: Secondary | ICD-10-CM

## 2016-12-14 DIAGNOSIS — O10919 Unspecified pre-existing hypertension complicating pregnancy, unspecified trimester: Secondary | ICD-10-CM

## 2016-12-14 LAB — CBC
HCT: 30.3 % — ABNORMAL LOW (ref 36.0–46.0)
HEMOGLOBIN: 10.6 g/dL — AB (ref 12.0–15.0)
MCH: 32.6 pg (ref 26.0–34.0)
MCHC: 35 g/dL (ref 30.0–36.0)
MCV: 93.2 fL (ref 78.0–100.0)
Platelets: 195 10*3/uL (ref 150–400)
RBC: 3.25 MIL/uL — AB (ref 3.87–5.11)
RDW: 13.7 % (ref 11.5–15.5)
WBC: 6.7 10*3/uL (ref 4.0–10.5)

## 2016-12-14 LAB — TYPE AND SCREEN
ABO/RH(D): B POS
Antibody Screen: NEGATIVE

## 2016-12-14 LAB — RPR: RPR: NONREACTIVE

## 2016-12-14 MED ORDER — LIDOCAINE HCL (PF) 1 % IJ SOLN
30.0000 mL | INTRAMUSCULAR | Status: DC | PRN
  Filled 2016-12-14: qty 30

## 2016-12-14 MED ORDER — TERBUTALINE SULFATE 1 MG/ML IJ SOLN
0.2500 mg | Freq: Once | INTRAMUSCULAR | Status: DC | PRN
  Filled 2016-12-14: qty 1

## 2016-12-14 MED ORDER — MISOPROSTOL 25 MCG QUARTER TABLET
25.0000 ug | ORAL_TABLET | ORAL | Status: DC | PRN
  Administered 2016-12-14 (×2): 25 ug via VAGINAL
  Filled 2016-12-14 (×2): qty 1

## 2016-12-14 MED ORDER — MISOPROSTOL 200 MCG PO TABS: 50.0000 ug | ORAL_TABLET | ORAL | Status: DC | PRN

## 2016-12-14 MED ORDER — LACTATED RINGERS IV SOLN
500.0000 mL | INTRAVENOUS | Status: DC | PRN
  Administered 2016-12-15 (×2): 1000 mL via INTRAVENOUS

## 2016-12-14 MED ORDER — LACTATED RINGERS IV SOLN
INTRAVENOUS | Status: DC
  Administered 2016-12-14 – 2016-12-15 (×3): via INTRAVENOUS

## 2016-12-14 MED ORDER — ACETAMINOPHEN 325 MG PO TABS: 650.0000 mg | ORAL_TABLET | ORAL | Status: DC | PRN

## 2016-12-14 MED ORDER — OXYTOCIN 40 UNITS IN LACTATED RINGERS INFUSION - SIMPLE MED
1.0000 m[IU]/min | INTRAVENOUS | Status: DC
  Administered 2016-12-14: 2 m[IU]/min via INTRAVENOUS
  Filled 2016-12-14: qty 1000

## 2016-12-14 MED ORDER — ONDANSETRON HCL 4 MG/2ML IJ SOLN
4.0000 mg | Freq: Four times a day (QID) | INTRAMUSCULAR | Status: DC | PRN
Start: 2016-12-14 — End: 2016-12-15

## 2016-12-14 MED ORDER — OXYCODONE-ACETAMINOPHEN 5-325 MG PO TABS
2.0000 | ORAL_TABLET | ORAL | Status: DC | PRN
Start: 2016-12-14 — End: 2016-12-15

## 2016-12-14 MED ORDER — SOD CITRATE-CITRIC ACID 500-334 MG/5ML PO SOLN
30.0000 mL | ORAL | Status: DC | PRN
  Administered 2016-12-14: 30 mL via ORAL
  Filled 2016-12-14: qty 15

## 2016-12-14 MED ORDER — FENTANYL CITRATE (PF) 100 MCG/2ML IJ SOLN
100.0000 ug | INTRAMUSCULAR | Status: DC | PRN
  Administered 2016-12-14 – 2016-12-15 (×6): 100 ug via INTRAVENOUS
  Filled 2016-12-14 (×6): qty 2

## 2016-12-14 MED ORDER — AMLODIPINE BESYLATE 10 MG PO TABS
10.0000 mg | ORAL_TABLET | Freq: Every day | ORAL | Status: DC
  Administered 2016-12-14: 10 mg via ORAL
  Filled 2016-12-14 (×3): qty 1

## 2016-12-14 MED ORDER — OXYTOCIN 40 UNITS IN LACTATED RINGERS INFUSION - SIMPLE MED
2.5000 [IU]/h | INTRAVENOUS | Status: DC
  Administered 2016-12-15: 2.5 [IU]/h via INTRAVENOUS

## 2016-12-14 MED ORDER — OXYCODONE-ACETAMINOPHEN 5-325 MG PO TABS: 1.0000 | ORAL_TABLET | ORAL | Status: DC | PRN

## 2016-12-14 MED ORDER — OXYTOCIN BOLUS FROM INFUSION
500.0000 mL | Freq: Once | INTRAVENOUS | Status: AC
  Administered 2016-12-15: 500 mL via INTRAVENOUS

## 2016-12-14 NOTE — Progress Notes (Signed)
Labor Progress Note  [name] [name] is a 31 y.o. G3P2002 at [redacted]w[redacted]d by LMP consistent with 12 week [name] admitted for induction of labor due to chronic hypertension.    S: Patient states her contractions are starting to get more painful and she is feeling more lower abdominal pressure and low back pain. Her pain adequately controlled with her current medication and she is fairly comfortable at the moment.  She still has a foley bulb in place at this time. She denies RUQ pain or epigastric pain, headaches, or vaginal discharge.  She does report some very light bleeding since her cervix was checked this AM. Pt denies nausea and vomiting.    O:  BP 124/70 (BP Location: Left Arm)   Pulse 68   Temp 97.9 F (36.6 C) (Oral)   Resp 20   Ht 5\' 7"  (1.702 m)   Wt 98.4 kg (217 lb)   LMP 03/16/2016   BMI 33.99 kg/m   General:  Appears comfortable, currently eating, no acute distress FHT:  FHR: 125 bpm, variability: moderate,  accelerations:  Present,  decelerations:  Absent UC:   irregular, every 5-6 minutes  SROM/AROM: membrane intact   Labs: Lab Results  Component Value Date   WBC 6.7 12/14/2016   HGB 10.6 (L) 12/14/2016   HCT 30.3 (L) 12/14/2016   MCV 93.2 12/14/2016   PLT 195 12/14/2016    Assessment / Plan: 31 y.o. [id number] [redacted]w[redacted]d by LMP consistent with 12 week [name] admitted for induction of labor due to chronic hypertension.  Continue monitoring progress with cytotec and foley bulb  Labor: Progressing normally Fetal Wellbeing:  Category I Pain Control:  Epidural on request Anticipated MOD:  SVD  Continue Expectant management   Arlana Lindau, PA_Student   I agree with the progress note above. I have seen and checked the patient, FB still in place. Placing another cytotec PV now. Reactive NST.  Zenda Alpers, DO  OB Fellow Center for [facility], [facility]

## 2016-12-14 NOTE — Progress Notes (Signed)
Patient ID: [name] [name], female   DOB: 1985-10-23, 31 y.o.   MRN: [id number]  S: Patient seen & examined for progress of labor. Patient comfortable, not feeling any contractions, just some mild cramping.    O:  Vitals:   12/14/16 0806 12/14/16 0813 12/14/16 0859 12/14/16 1049  BP: (!) 137/96  132/89 124/70  Pulse: 90  77 68  Resp: 18  20 20   Temp: 98.4 F (36.9 C)   97.9 F (36.6 C)  TempSrc: Oral   Oral  Weight:  217 lb (98.4 kg)    Height:  5\' 7"  (1.702 m)      Dilation: 1.5 Effacement (%): 50 Cervical Position: Posterior Station: -3 Presentation: Vertex Exam by:: Dr. Vanetta Shawl   FHT: 145bpm, mod var, +accels, no decels TOCO: q50min, irregular  FB placed with ease, confirmed placement.  A/P:   31 y.o. G3P2002 at [redacted]w[redacted]d here for IOL for [facility]. FB placed Continue cytotec Continue expectant management Anticipate SVD

## 2016-12-14 NOTE — H&P (Signed)
LABOR AND DELIVERY ADMISSION HISTORY AND PHYSICAL NOTE  [name] [name] is a 31 y.o. female G29P2002 with IUP at [redacted]w[redacted]d by LMP consistent with 12 week ultrasound presenting for IOL for chronic hypertension on norvasc 10 mg. She has two prior pregnancies which she reports were uncomplicated except for chronic hypertension, she reports that she was induced with uncomplicated vaginal deliveries.  She reports +FM, + contractions, No LOF, no VB, no blurry vision, headaches or peripheral edema, and RUQ pain.  She plans on breast feeding. She request BTL for birth control - papers signed 4/23.  Prenatal History/Complications:  Past Medical History: Past Medical History:  Diagnosis Date  . Asthma, allergic    uses inhaler prn - infrequently  . Bilateral thoracic back pain 09/13/2015  . Eczema   . Hypertension   . Lactose intolerance   . Mature cystic teratoma of ovary   . Mood swings ([place]) 06/01/2015  . Seasonal allergies     Past Surgical History: Past Surgical History:  Procedure Laterality Date  . WISDOM TOOTH EXTRACTION      Obstetrical History: OB History    Gravida Para Term Preterm AB Living   3 2 2     2    SAB TAB Ectopic Multiple Live Births         0 2      Social History: Social History   Social History  . Marital status: Single    Spouse name: N/A  . Number of children: N/A  . Years of education: N/A   Social History Main Topics  . Smoking status: Current Every Day Smoker    Packs/day: 0.25    Types: Cigarettes  . Smokeless tobacco: Former Systems developer  . Alcohol use No  . Drug use: No  . Sexual activity: Not Currently    Birth control/ protection: None   Other Topics Concern  . None   Social History Narrative  . None    Family History: Family History  Problem Relation Age of Onset  . Adopted: Yes  . Hypertension Father   . Hypertension Maternal Aunt   . Hypertension Maternal Uncle   . Hypertension Maternal Grandmother   . Eczema Maternal Grandmother    . Hypertension Maternal Grandfather   . Hypertension Paternal Grandmother   . Hypertension Mother   . Eczema Mother     Allergies: Allergies  Allergen Reactions  . Sulfa Antibiotics Swelling    Causes swelling on the face.  . Shellfish Allergy Other (See Comments)    Stomach upset    Prescriptions Prior to Admission  Medication Sig Dispense Refill Last Dose  . acetaminophen (TYLENOL) 325 MG tablet Take 325 mg by mouth every 6 (six) hours as needed for moderate pain. Reported on 09/27/2015   Past Week at Unknown time  . amLODipine (NORVASC) 10 MG tablet Take 1 tablet (10 mg total) by mouth daily. 30 tablet 2 12/13/2016 at Unknown time  . cetirizine (ZYRTEC) 10 MG tablet Take 1 tablet (10 mg total) by mouth daily. 30 tablet 2 12/13/2016 at Unknown time  . montelukast (SINGULAIR) 10 MG tablet Take 1 tablet (10 mg total) by mouth at bedtime. 30 tablet 2 12/13/2016 at Unknown time  . Prenatal Vit w/Fe-Methylfol-FA (PNV PO) Take by mouth.   Past Week at Unknown time  . triamcinolone cream (KENALOG) 0.1 % Apply 1 application topically 2 (two) times daily as needed. To affected areas for eczema. 80 g 1 12/14/2016 at Unknown time  . aspirin EC 81 MG  tablet Take 1 tablet (81 mg total) by mouth daily. (Patient not taking: Reported on 12/07/2016) 90 tablet 3 Not Taking at Unknown time  . omeprazole (PRILOSEC OTC) 20 MG tablet Take 1 tablet (20 mg total) by mouth daily. (Patient not taking: Reported on 12/14/2016) 30 tablet 3 Not Taking at Unknown time     Review of Systems   All systems reviewed and negative except as stated in HPI  Physical Exam Blood pressure (!) 137/96, pulse 90, temperature 98.4 F (36.9 C), temperature source Oral, resp. rate 18, height 5\' 7"  (1.702 m), weight 98.4 kg (217 lb), last menstrual period 03/16/2016, unknown if currently breastfeeding. General appearance: alert, cooperative and appears stated age Fetal monitoring: 130 bmp, mod var, accels no decels Uterine  activity: irregularity/irritability  Dilation: 1 Effacement (%): 50 Station: -3 Exam by:: Curly Shores, RN   Prenatal labs: ABO, Rh: B/POS/-- (01/04 1539) Antibody: NEG (01/04 1539) Rubella: Immune RPR: Non Reactive (04/09 0902)  HBsAg: NEGATIVE (01/04 1539)  HIV: Non Reactive (04/09 0902)  GBS: Negative (06/07 0000)  1 hr Glucola: normal Genetic screening:  normal Anatomy US: WNL  Prenatal Transfer Tool  Maternal Diabetes: No Genetic Screening: Normal Maternal Ultrasounds/Referrals: Normal Fetal Ultrasounds or other Referrals:  None Maternal Substance Abuse:  No Significant Maternal Medications:  Meds include: Other: Norvasc Significant Maternal Lab Results: Lab values include: Group B Strep negative  Results for orders placed or performed during the hospital encounter of 12/14/16 (from the past 24 hour(s))  CBC   Collection Time: 12/14/16  8:10 AM  Result Value Ref Range   WBC 6.7 4.0 - 10.5 K/uL   RBC 3.25 (L) 3.87 - 5.11 MIL/uL   Hemoglobin 10.6 (L) 12.0 - 15.0 g/dL   HCT 30.3 (L) 36.0 - 46.0 %   MCV 93.2 78.0 - 100.0 fL   MCH 32.6 26.0 - 34.0 pg   MCHC 35.0 30.0 - 36.0 g/dL   RDW 13.7 11.5 - 15.5 %   Platelets 195 150 - 400 K/uL    Patient Active Problem List   Diagnosis Date Noted  . Chronic hypertension in pregnancy 12/14/2016  . Gastroesophageal reflux disease without esophagitis 10/09/2016  . BMI 31.0-31.9,adult 07/20/2016  . Obesity in pregnancy, antepartum 07/20/2016  . Supervision of high risk pregnancy in second trimester 06/22/2016  . Hypertension in pregnancy, essential, antepartum 06/22/2016  . Tobacco abuse 06/01/2015  . Marijuana use 06/23/2014    Assessment: Jailynn Lavalais is a 31 y.o. G3P2002 at [redacted]w[redacted]d here for IOL for cHTN  #Labor: induce with cytotec, can attempt foley #Pain: Epidural on request #FWB: Category I #ID:  GBS neg #MOF: breast #MOC:BTL (papers signed) #Circ:  N/A (girl)  Everrett Coombe, MD PGY-1 [facility] 12/14/2016, 9:12 AM  OB FELLOW HISTORY AND PHYSICAL ATTESTATION  I have seen and examined this patient; I agree with above documentation in the resident's note. Foley bulb has been placed.    Katherine Basset, DO OB Fellow 12/14/2016, 1:07 PM

## 2016-12-14 NOTE — Anesthesia Pain Management Evaluation Note (Signed)
  CRNA Pain Management Visit Note  Patient: [facility], 31 y.o., female  "Hello I am a member of the anesthesia team at [facility]. We have an anesthesia team available at all times to provide care throughout the hospital, including epidural management and anesthesia for C-section. I don't know your plan for the delivery whether it a natural birth, water birth, IV sedation, nitrous supplementation, doula or epidural, but we want to meet your pain goals."   1.Was your pain managed to your expectations on prior hospitalizations?   yes  2.What is your expectation for pain management during this hospitalization?     epidural  3.How can we help you reach that goal? epidural  Record the patient's initial score and the patient's pain goal.   Pain: 2/10  Pain Goal: 2/10 [facility] wants you to be able to say your pain was always managed very well.  Ailene Ards 12/14/2016

## 2016-12-14 NOTE — Progress Notes (Signed)
Shulamis [place] is a 31 y.o. [id number] at [redacted]w[redacted]d  admitted for induction of labor due to [facility].  Subjective: Patient feeling more cramping and discomfort from foley bulb.  Objective: Vitals:   12/14/16 1049 12/14/16 1738 12/14/16 1742 12/14/16 1937  BP: 124/70 (!) 142/96 139/86 (!) 153/92  Pulse: 68 80 76 72  Resp: 20 20  18   Temp: 97.9 F (36.6 C) 98 F (36.7 C)  98.5 F (36.9 C)  TempSrc: Oral Oral  Oral  Weight:      Height:       FHT:  FHR: 135 bpm, variability: moderate,  accelerations:  Present,  decelerations:  Absent UC:   regular, every 2-5 minutes SVE:   Dilation: 4 Effacement (%): 60, 70 Station: -3 Exam by:: Gailen Shelter RN   Labs: Lab Results  Component Value Date   WBC 6.7 12/14/2016   HGB 10.6 (L) 12/14/2016   HCT 30.3 (L) 12/14/2016   MCV 93.2 12/14/2016   PLT 195 12/14/2016    Assessment / Plan: IUP at term. IOL for [facility]. GBS neg  Foley bulb out at 2020. Patient can eat and shower if desires  Plan: start pitocin 2x2. Anticipate NSVD  Len Blalock SNM 12/14/2016, 8:41 PM

## 2016-12-15 ENCOUNTER — Encounter (HOSPITAL_COMMUNITY)
Admission: RE | Disposition: A | Discharge status: Home / Self Care | Payer: Self-pay | Source: Ambulatory Visit | Attending: Obstetrics and Gynecology

## 2016-12-15 ENCOUNTER — Inpatient Hospital Stay (HOSPITAL_COMMUNITY): Payer: Medicaid Other | Admitting: Anesthesiology

## 2016-12-15 ENCOUNTER — Encounter (HOSPITAL_COMMUNITY): Payer: Self-pay

## 2016-12-15 DIAGNOSIS — Z3A39 39 weeks gestation of pregnancy: Secondary | ICD-10-CM

## 2016-12-15 DIAGNOSIS — O1092 Unspecified pre-existing hypertension complicating childbirth: Secondary | ICD-10-CM

## 2016-12-15 DIAGNOSIS — Z302 Encounter for sterilization: Secondary | ICD-10-CM

## 2016-12-15 HISTORY — PX: TUBAL LIGATION: SHX77

## 2016-12-15 LAB — CBC
HEMATOCRIT: 33.5 % — AB (ref 36.0–46.0)
HEMOGLOBIN: 11.6 g/dL — AB (ref 12.0–15.0)
MCH: 32.3 pg (ref 26.0–34.0)
MCHC: 34.6 g/dL (ref 30.0–36.0)
MCV: 93.3 fL (ref 78.0–100.0)
Platelets: 191 10*3/uL (ref 150–400)
RBC: 3.59 MIL/uL — ABNORMAL LOW (ref 3.87–5.11)
RDW: 13.6 % (ref 11.5–15.5)
WBC: 11.8 10*3/uL — AB (ref 4.0–10.5)

## 2016-12-15 SURGERY — LIGATION, FALLOPIAN TUBE, POSTPARTUM
Anesthesia: Epidural | Site: Abdomen

## 2016-12-15 MED ORDER — FAMOTIDINE 20 MG PO TABS: 40.0000 mg | ORAL_TABLET | Freq: Once | ORAL | Status: DC

## 2016-12-15 MED ORDER — DIPHENHYDRAMINE HCL 25 MG PO CAPS: 25.0000 mg | ORAL_CAPSULE | Freq: Four times a day (QID) | ORAL | Status: DC | PRN

## 2016-12-15 MED ORDER — BUPIVACAINE HCL (PF) 0.25 % IJ SOLN
INTRAMUSCULAR | Status: AC
  Filled 2016-12-15: qty 30

## 2016-12-15 MED ORDER — ONDANSETRON HCL 4 MG PO TABS: 4.0000 mg | ORAL_TABLET | ORAL | Status: DC | PRN

## 2016-12-15 MED ORDER — PHENYLEPHRINE 40 MCG/ML (10ML) SYRINGE FOR IV PUSH (FOR BLOOD PRESSURE SUPPORT)
PREFILLED_SYRINGE | INTRAVENOUS | Status: AC
  Filled 2016-12-15: qty 10

## 2016-12-15 MED ORDER — MIDAZOLAM HCL 5 MG/5ML IJ SOLN
INTRAMUSCULAR | Status: DC | PRN
  Administered 2016-12-15: 1 mg via INTRAVENOUS

## 2016-12-15 MED ORDER — PHENYLEPHRINE HCL 10 MG/ML IJ SOLN
INTRAMUSCULAR | Status: DC | PRN
  Administered 2016-12-15 (×6): 80 ug via INTRAVENOUS

## 2016-12-15 MED ORDER — MIDAZOLAM HCL 2 MG/2ML IJ SOLN
INTRAMUSCULAR | Status: AC
  Filled 2016-12-15: qty 2

## 2016-12-15 MED ORDER — PRENATAL MULTIVITAMIN CH: 1.0000 | ORAL_TABLET | Freq: Every day | ORAL | Status: DC

## 2016-12-15 MED ORDER — DIBUCAINE 1 % RE OINT: 1.0000 "application " | TOPICAL_OINTMENT | RECTAL | Status: DC | PRN

## 2016-12-15 MED ORDER — METOCLOPRAMIDE HCL 10 MG PO TABS
10.0000 mg | ORAL_TABLET | Freq: Once | ORAL | Status: AC
  Administered 2016-12-15: 10 mg via ORAL
  Filled 2016-12-15: qty 1

## 2016-12-15 MED ORDER — FENTANYL 2.5 MCG/ML BUPIVACAINE 1/10 % EPIDURAL INFUSION (WH - ANES)
14.0000 mL/h | INTRAMUSCULAR | Status: DC | PRN
  Administered 2016-12-15: 14 mL/h via EPIDURAL
  Filled 2016-12-15: qty 100

## 2016-12-15 MED ORDER — IBUPROFEN 600 MG PO TABS: 600.0000 mg | ORAL_TABLET | Freq: Four times a day (QID) | ORAL | Status: DC

## 2016-12-15 MED ORDER — ONDANSETRON HCL 4 MG/2ML IJ SOLN: 4.0000 mg | INTRAMUSCULAR | Status: DC | PRN

## 2016-12-15 MED ORDER — DIPHENHYDRAMINE HCL 50 MG/ML IJ SOLN: 12.5000 mg | INTRAMUSCULAR | Status: DC | PRN

## 2016-12-15 MED ORDER — PHENYLEPHRINE 40 MCG/ML (10ML) SYRINGE FOR IV PUSH (FOR BLOOD PRESSURE SUPPORT)
80.0000 ug | PREFILLED_SYRINGE | INTRAVENOUS | Status: DC | PRN
  Administered 2016-12-15: 80 ug via INTRAVENOUS
  Filled 2016-12-15: qty 5

## 2016-12-15 MED ORDER — LIDOCAINE HCL (PF) 1 % IJ SOLN
INTRAMUSCULAR | Status: DC | PRN
  Administered 2016-12-15 (×4): 5 mL via EPIDURAL

## 2016-12-15 MED ORDER — LACTATED RINGERS IV SOLN
INTRAVENOUS | Status: DC | PRN
  Administered 2016-12-15: 15:00:00 via INTRAVENOUS

## 2016-12-15 MED ORDER — ZOLPIDEM TARTRATE 5 MG PO TABS: 5.0000 mg | ORAL_TABLET | Freq: Every evening | ORAL | Status: DC | PRN

## 2016-12-15 MED ORDER — SENNOSIDES-DOCUSATE SODIUM 8.6-50 MG PO TABS
2.0000 | ORAL_TABLET | ORAL | Status: DC
  Administered 2016-12-16 (×2): 2 via ORAL
  Filled 2016-12-15 (×2): qty 2

## 2016-12-15 MED ORDER — SIMETHICONE 80 MG PO CHEW: 80.0000 mg | CHEWABLE_TABLET | ORAL | Status: DC | PRN

## 2016-12-15 MED ORDER — HYDROMORPHONE HCL 1 MG/ML IJ SOLN: 0.2500 mg | INTRAMUSCULAR | Status: DC | PRN

## 2016-12-15 MED ORDER — COCONUT OIL OIL: 1.0000 "application " | TOPICAL_OIL | Status: DC | PRN

## 2016-12-15 MED ORDER — LACTATED RINGERS IV SOLN: INTRAVENOUS | Status: DC

## 2016-12-15 MED ORDER — AMLODIPINE BESYLATE 10 MG PO TABS
10.0000 mg | ORAL_TABLET | Freq: Every day | ORAL | Status: DC
  Administered 2016-12-15 – 2016-12-17 (×3): 10 mg via ORAL
  Filled 2016-12-15 (×3): qty 1

## 2016-12-15 MED ORDER — FENTANYL CITRATE (PF) 100 MCG/2ML IJ SOLN
INTRAMUSCULAR | Status: AC
  Filled 2016-12-15: qty 2

## 2016-12-15 MED ORDER — SODIUM BICARBONATE 8.4 % IV SOLN
INTRAVENOUS | Status: DC | PRN
  Administered 2016-12-15 (×4): 5 mL via EPIDURAL

## 2016-12-15 MED ORDER — METHYLERGONOVINE MALEATE 0.2 MG PO TABS: 0.2000 mg | ORAL_TABLET | ORAL | Status: DC | PRN

## 2016-12-15 MED ORDER — WITCH HAZEL-GLYCERIN EX PADS: 1.0000 "application " | MEDICATED_PAD | CUTANEOUS | Status: DC | PRN

## 2016-12-15 MED ORDER — IBUPROFEN 600 MG PO TABS
600.0000 mg | ORAL_TABLET | Freq: Four times a day (QID) | ORAL | Status: DC
  Administered 2016-12-15 – 2016-12-17 (×7): 600 mg via ORAL
  Filled 2016-12-15 (×7): qty 1

## 2016-12-15 MED ORDER — LIDOCAINE-EPINEPHRINE (PF) 2 %-1:200000 IJ SOLN
INTRAMUSCULAR | Status: AC
  Filled 2016-12-15: qty 20

## 2016-12-15 MED ORDER — BENZOCAINE-MENTHOL 20-0.5 % EX AERO: 1.0000 "application " | INHALATION_SPRAY | CUTANEOUS | Status: DC | PRN

## 2016-12-15 MED ORDER — TETANUS-DIPHTH-ACELL PERTUSSIS 5-2.5-18.5 LF-MCG/0.5 IM SUSP: 0.5000 mL | Freq: Once | INTRAMUSCULAR | Status: DC

## 2016-12-15 MED ORDER — FENTANYL CITRATE (PF) 100 MCG/2ML IJ SOLN
INTRAMUSCULAR | Status: DC | PRN
  Administered 2016-12-15: 50 ug via INTRAVENOUS

## 2016-12-15 MED ORDER — METOCLOPRAMIDE HCL 10 MG PO TABS: 10.0000 mg | ORAL_TABLET | Freq: Once | ORAL | Status: DC

## 2016-12-15 MED ORDER — COCONUT OIL OIL
1.0000 | TOPICAL_OIL | Status: DC | PRN
Start: 2016-12-15 — End: 2016-12-15

## 2016-12-15 MED ORDER — METHYLERGONOVINE MALEATE 0.2 MG/ML IJ SOLN: 0.2000 mg | INTRAMUSCULAR | Status: DC | PRN

## 2016-12-15 MED ORDER — PHENYLEPHRINE 40 MCG/ML (10ML) SYRINGE FOR IV PUSH (FOR BLOOD PRESSURE SUPPORT)
80.0000 ug | PREFILLED_SYRINGE | INTRAVENOUS | Status: DC | PRN
  Administered 2016-12-15: 80 ug via INTRAVENOUS
  Filled 2016-12-15: qty 5
  Filled 2016-12-15: qty 10

## 2016-12-15 MED ORDER — FAMOTIDINE 20 MG PO TABS
40.0000 mg | ORAL_TABLET | Freq: Once | ORAL | Status: AC
  Administered 2016-12-15: 40 mg via ORAL
  Filled 2016-12-15: qty 2

## 2016-12-15 MED ORDER — ACETAMINOPHEN 325 MG PO TABS
650.0000 mg | ORAL_TABLET | ORAL | Status: DC | PRN
  Administered 2016-12-15: 650 mg via ORAL
  Filled 2016-12-15: qty 2

## 2016-12-15 MED ORDER — BUPIVACAINE HCL (PF) 0.25 % IJ SOLN
INTRAMUSCULAR | Status: DC | PRN
  Administered 2016-12-15: 10 mL

## 2016-12-15 MED ORDER — SIMETHICONE 80 MG PO CHEW
80.0000 mg | CHEWABLE_TABLET | ORAL | Status: DC | PRN
  Administered 2016-12-16: 80 mg via ORAL
  Filled 2016-12-15: qty 1

## 2016-12-15 MED ORDER — EPHEDRINE 5 MG/ML INJ
10.0000 mg | INTRAVENOUS | Status: DC | PRN
  Filled 2016-12-15: qty 2

## 2016-12-15 MED ORDER — PROMETHAZINE HCL 25 MG/ML IJ SOLN: 6.2500 mg | INTRAMUSCULAR | Status: DC | PRN

## 2016-12-15 MED ORDER — LACTATED RINGERS IV SOLN
500.0000 mL | Freq: Once | INTRAVENOUS | Status: AC
  Administered 2016-12-15: 1000 mL via INTRAVENOUS

## 2016-12-15 MED ORDER — LACTATED RINGERS IV SOLN
INTRAVENOUS | Status: DC
  Administered 2016-12-15: 125 mL/h via INTRAVENOUS

## 2016-12-15 MED ORDER — ONDANSETRON HCL 4 MG/2ML IJ SOLN
INTRAMUSCULAR | Status: DC | PRN
  Administered 2016-12-15: 4 mg via INTRAVENOUS

## 2016-12-15 MED ORDER — ACETAMINOPHEN 325 MG PO TABS: 650.0000 mg | ORAL_TABLET | ORAL | Status: DC | PRN

## 2016-12-15 MED ORDER — OXYCODONE HCL 5 MG PO TABS
5.0000 mg | ORAL_TABLET | ORAL | Status: DC | PRN
  Administered 2016-12-15 – 2016-12-17 (×8): 5 mg via ORAL
  Filled 2016-12-15 (×8): qty 1

## 2016-12-15 MED ORDER — SODIUM BICARBONATE 8.4 % IV SOLN
INTRAVENOUS | Status: AC
  Filled 2016-12-15: qty 50

## 2016-12-15 MED ORDER — PRENATAL MULTIVITAMIN CH
1.0000 | ORAL_TABLET | Freq: Every day | ORAL | Status: DC
  Administered 2016-12-16: 1 via ORAL
  Filled 2016-12-15: qty 1

## 2016-12-15 MED ORDER — SENNOSIDES-DOCUSATE SODIUM 8.6-50 MG PO TABS: 2.0000 | ORAL_TABLET | ORAL | Status: DC

## 2016-12-15 SURGICAL SUPPLY — 24 items
CONTAINER PREFILL 10% NBF 15ML (MISCELLANEOUS) ×6 IMPLANT
DRSG OPSITE POSTOP 3X4 (GAUZE/BANDAGES/DRESSINGS) ×3 IMPLANT
DURAPREP 26ML APPLICATOR (WOUND CARE) ×3 IMPLANT
GLOVE BIOGEL PI IND STRL 6.5 (GLOVE) ×1 IMPLANT
GLOVE BIOGEL PI IND STRL 7.0 (GLOVE) ×1 IMPLANT
GLOVE BIOGEL PI INDICATOR 6.5 (GLOVE) ×2
GLOVE BIOGEL PI INDICATOR 7.0 (GLOVE) ×2
GLOVE SURG SS PI 6.0 STRL IVOR (GLOVE) ×3 IMPLANT
GOWN STRL REUS W/TWL LRG LVL3 (GOWN DISPOSABLE) ×6 IMPLANT
NEEDLE HYPO 22GX1.5 SAFETY (NEEDLE) ×2 IMPLANT
NS IRRIG 1000ML POUR BTL (IV SOLUTION) ×3 IMPLANT
PACK ABDOMINAL MINOR (CUSTOM PROCEDURE TRAY) ×3 IMPLANT
PROTECTOR NERVE ULNAR (MISCELLANEOUS) ×3 IMPLANT
SLEEVE SCD COMPRESS KNEE MED (MISCELLANEOUS) ×2 IMPLANT
SPONGE GAUZE 2X2 8PLY STER LF (GAUZE/BANDAGES/DRESSINGS) ×1
SPONGE GAUZE 2X2 8PLY STRL LF (GAUZE/BANDAGES/DRESSINGS) ×2 IMPLANT
SPONGE LAP 4X18 X RAY DECT (DISPOSABLE) ×2 IMPLANT
SUT PLAIN 0 NONE (SUTURE) ×3 IMPLANT
SUT VIC AB 0 CT1 27 (SUTURE) ×3
SUT VIC AB 0 CT1 27XBRD ANBCTR (SUTURE) ×1 IMPLANT
SUT VIC AB 3-0 PS2 18 (SUTURE) ×3 IMPLANT
SYR CONTROL 10ML LL (SYRINGE) IMPLANT
TOWEL OR 17X24 6PK STRL BLUE (TOWEL DISPOSABLE) ×6 IMPLANT
TRAY FOLEY CATH SILVER 14FR (SET/KITS/TRAYS/PACK) ×3 IMPLANT

## 2016-12-15 NOTE — Lactation Note (Signed)
This note was copied from a baby's chart. Lactation Consultation Note  Patient Name: [name] [name] Today's Date: 12/15/2016 Reason for consult: Initial assessment  Initial consult with mom of < 1 hour old infant in [place]. Mom reports she Exclusively BF her 2 and 31 yo until they go teeth. She reports she usually has a surplus of milk.   Infant STS with mom and cueing to feed. Mom latched infant to right breast in the cross cradle hold. Infant actively feeding. Enc mom to massage/compress breast with feeding. Enc mom to feed infant STS 8-12 x in 24 hours at first feeding cues. Enc mom to use pillow and head support with feedings. Enc mom to call out for feeding assistance as needed.   BF Resources handout and [place] Brochure given, mom informed of IP/OP services, BF Support Groups and [place] phone #. Mom is a [facility] client and is aware to call and make appt post d/c.   Mom going for tubal at 3:45. Mom is able to hand express. Colostrum collection container given to mom to collect EBM prior to surgery if she desires. Mom without further questions/concerns at this time.   Maternal Data Formula Feeding for Exclusion: No Has patient been taught Hand Expression?: Yes Does the patient have breastfeeding experience prior to this delivery?: Yes  Feeding Feeding Type: Breast Fed Length of feed: 10 min  LATCH Score/Interventions Latch: Grasps breast easily, tongue down, lips flanged, rhythmical sucking.  Audible Swallowing: Spontaneous and intermittent  Type of Nipple: Everted at rest and after stimulation  Comfort (Breast/Nipple): Soft / non-tender     Hold (Positioning): No assistance needed to correctly position infant at breast.  LATCH Score: 10  Lactation Tools Discussed/Used WIC Program: Yes   Consult Status Consult Status: Follow-up Date: 12/16/16 Follow-up type: In-patient    [name] [name] 12/15/2016, 1:03 PM

## 2016-12-15 NOTE — Transfer of Care (Signed)
Immediate Anesthesia Transfer of Care Note  Patient: [facility]  Procedure(s) Performed: Procedure(s): POST PARTUM TUBAL LIGATION (N/A)  Patient Location: PACU  Anesthesia Type:Epidural  Level of Consciousness: awake, alert  and oriented  Airway & Oxygen Therapy: Patient Spontanous Breathing  Post-op Assessment: Report given to RN and Post -op Vital signs reviewed and stable  Post vital signs: Reviewed and stable  Last Vitals:  Vitals:   12/15/16 1345 12/15/16 1415  BP: 134/81 128/85  Pulse: (!) 103 91  Resp: 20 20  Temp:  37 C    Last Pain:  Vitals:   12/15/16 1415  TempSrc: Oral  PainSc: 0-No pain      Patients Stated Pain Goal: 6 (02/33/43 5686)  Complications: No apparent anesthesia complications

## 2016-12-15 NOTE — Anesthesia Procedure Notes (Signed)
Epidural Patient location during procedure: OB Start time: 12/15/2016 8:50 AM End time: 12/15/2016 9:07 AM  Staffing Anesthesiologist: Duane Boston Performed: anesthesiologist   Preanesthetic Checklist Completed: patient identified, site marked, pre-op evaluation, timeout performed, IV checked, risks and benefits discussed and monitors and equipment checked  Epidural Patient position: sitting Prep: DuraPrep Patient monitoring: heart rate, cardiac monitor, continuous pulse ox and blood pressure Approach: midline Location: L2-L3 Injection technique: LOR saline  Needle:  Needle type: Tuohy  Needle gauge: 17 G Needle length: 9 cm Needle insertion depth: 9 cm Catheter size: 20 Guage Catheter at skin depth: 14 cm Test dose: negative and Other  Assessment Events: blood not aspirated, injection not painful, no injection resistance and negative IV test  Additional Notes Informed consent obtained prior to proceeding including risk of failure, 1% risk of PDPH, risk of minor discomfort and bruising.  Discussed rare but serious complications including epidural abscess, permanent nerve injury, epidural hematoma.  Discussed alternatives to epidural analgesia and patient desires to proceed.  Timeout performed pre-procedure verifying patient name, procedure, and platelet count.  Patient tolerated procedure well.

## 2016-12-15 NOTE — Anesthesia Preprocedure Evaluation (Addendum)
Anesthesia Evaluation  Patient identified by MRN, date of birth, ID band Patient awake    Reviewed: Allergy & Precautions, NPO status , Patient's Chart, lab work & pertinent test results  History of Anesthesia Complications Negative for: history of anesthetic complications  Airway Mallampati: II  TM Distance: >3 FB Neck ROM: Full    Dental no notable dental hx. (+) Dental Advisory Given   Pulmonary asthma , Current Smoker,    Pulmonary exam normal breath sounds clear to auscultation       Cardiovascular hypertension, Pt. on medications Normal cardiovascular exam Rhythm:Regular Rate:Normal     Neuro/Psych negative neurological ROS  negative psych ROS   GI/Hepatic negative GI ROS, Neg liver ROS,   Endo/Other  negative endocrine ROS  Renal/GU negative Renal ROS  negative genitourinary   Musculoskeletal negative musculoskeletal ROS (+)   Abdominal   Peds negative pediatric ROS (+)  Hematology negative hematology ROS (+)   Anesthesia Other Findings   Reproductive/Obstetrics negative OB ROS                             Anesthesia Physical  Anesthesia Plan  ASA: III  Anesthesia Plan: Epidural   Post-op Pain Management:    Induction:   PONV Risk Score and Plan:   Airway Management Planned: Natural Airway and Simple Face Mask  Additional Equipment:   Intra-op Plan:   Post-operative Plan:   Informed Consent: I have reviewed the patients History and Physical, chart, labs and discussed the procedure including the risks, benefits and alternatives for the proposed anesthesia with the patient or authorized representative who has indicated his/her understanding and acceptance.   Dental advisory given  Plan Discussed with: Anesthesiologist and CRNA  Anesthesia Plan Comments:         Anesthesia Quick Evaluation

## 2016-12-15 NOTE — Anesthesia Preprocedure Evaluation (Signed)
Anesthesia Evaluation  Patient identified by MRN, date of birth, ID band Patient awake    Reviewed: Allergy & Precautions, NPO status , Patient's Chart, lab work & pertinent test results  History of Anesthesia Complications Negative for: history of anesthetic complications  Airway Mallampati: II  TM Distance: >3 FB Neck ROM: Full    Dental no notable dental hx. (+) Dental Advisory Given   Pulmonary asthma , Current Smoker,    Pulmonary exam normal breath sounds clear to auscultation       Cardiovascular hypertension, Pt. on medications Normal cardiovascular exam Rhythm:Regular Rate:Normal     Neuro/Psych negative neurological ROS  negative psych ROS   GI/Hepatic negative GI ROS, Neg liver ROS,   Endo/Other  negative endocrine ROS  Renal/GU negative Renal ROS  negative genitourinary   Musculoskeletal negative musculoskeletal ROS (+)   Abdominal   Peds negative pediatric ROS (+)  Hematology negative hematology ROS (+)   Anesthesia Other Findings   Reproductive/Obstetrics (+) Pregnancy                             Anesthesia Physical  Anesthesia Plan  ASA: III  Anesthesia Plan: Epidural   Post-op Pain Management:    Induction:   PONV Risk Score and Plan:   Airway Management Planned:   Additional Equipment:   Intra-op Plan:   Post-operative Plan:   Informed Consent: I have reviewed the patients History and Physical, chart, labs and discussed the procedure including the risks, benefits and alternatives for the proposed anesthesia with the patient or authorized representative who has indicated his/her understanding and acceptance.   Dental advisory given  Plan Discussed with: Anesthesiologist  Anesthesia Plan Comments:         Anesthesia Quick Evaluation

## 2016-12-15 NOTE — Progress Notes (Signed)
S: Patient seen & examined for progress of labor. Patient with epidural 20-30 mins ago however continues to be uncomfortable. After we left room, anesthesia was called and epidural replaced.   Dilation: 6.5 Effacement (%): 70 Cervical Position: Anterior Station: -2 Presentation: Vertex Exam by:: Laury Deep, CNM   FHT: 130 bpm, min variability, no accels, no decels TOCO: q66min   A/P: Labor: s/p foley bulb, cytotec x2, now on pitocin @20  mu/min Pain: epidural (replaced) FWB: category II tracing due to min variability, no accels. Position changed. Continue to monitor. CNM notified.  Continue expectant management Anticipate SVD  Everrett Coombe, MD PGY-1 [place]

## 2016-12-15 NOTE — Anesthesia Postprocedure Evaluation (Signed)
Anesthesia Post Note  Patient: [facility]  Procedure(s) Performed: * No procedures listed *     Patient location during evaluation: Mother Baby Anesthesia Type: Epidural Level of consciousness: awake and alert Pain management: pain level controlled Vital Signs Assessment: post-procedure vital signs reviewed and stable Respiratory status: spontaneous breathing, nonlabored ventilation and respiratory function stable Cardiovascular status: stable Postop Assessment: no headache, no backache and epidural receding Anesthetic complications: no    Last Vitals:  Vitals:   12/15/16 1815 12/15/16 2000  BP: 139/86 132/65  Pulse: 84 91  Resp: 20 20  Temp: 37.7 C 37.4 C    Last Pain:  Vitals:   12/15/16 2000  TempSrc: Oral  PainSc: 2    Pain Goal: Patients Stated Pain Goal: 6 (12/15/16 0731)               Gilmer Mor

## 2016-12-15 NOTE — Op Note (Signed)
PREOPERATIVE DIAGNOSIS:  Undesired fertility  POSTOPERATIVE DIAGNOSIS:  Undesired fertility  PROCEDURE:  Postpartum Bilateral Tubal Sterilization using Pomeroy method   ANESTHESIA:  Epidural  COMPLICATIONS:  None immediate.  ESTIMATED BLOOD LOSS:  Less than 20cc.  FLUIDS: 800 cc LR.  INDICATIONS: 31 y.o. yo [id number]  with undesired fertility,status post vaginal delivery, desires permanent sterilization. Risks and benefits of procedure discussed with patient including permanence of method, bleeding, infection, injury to surrounding organs and need for additional procedures. Risk failure of 0.5-1% with increased risk of ectopic gestation if pregnancy occurs was also discussed with patient.   FINDINGS:  Normal uterus, tubes, and ovaries.  TECHNIQUE: After informed consent was obtained, the patient was taken to the operating room where anesthesia was induced and found to be adequate. A small transverse, infraumbilical skin incision was made with the scalpel. This incision was carried down to the underlying layer of fascia. The fascia was grasped with Kocher clamps tented up and entered sharply with Mayo scissors. Underlying peritoneum was then identified tented up and entered sharply with Metzenbaum scissors. The fascia was tagged with 0 Vicryl. The patient's left fallopian tube was then identified, brought to the incision, and grasped with a Babcock clamp. The tube was then followed out to the fimbria. The Babcock clamp was then used to grasp the tube approximately 4 cm from the cornual region. A 3 cm segment of the tube was then ligated with free tie of plain gut suture, transected and excised. Good hemostasis was noted and the tube was returned to the abdomen. The right fallopian tube was then identified to its fimbriated end, ligated, and a 3 cm segment excised in a similar fashion. Excellent hemostasis was noted, and the tube returned to the abdomen. The fascia was re-approximated with 0 Vicryl.  The skin was closed in a subcuticular fashion with 3-0 Vicryl. Quarter percent Marcaine solution was then injected at the incision site. The patient tolerated the procedure well. Sponge, lap, and needle count were correct x2. The patient was taken to recovery room in stable condition.

## 2016-12-15 NOTE — Anesthesia Procedure Notes (Signed)
Epidural Patient location during procedure: OB Start time: 12/15/2016 10:08 AM End time: 12/15/2016 10:26 AM  Staffing Anesthesiologist: Duane Boston Performed: anesthesiologist   Preanesthetic Checklist Completed: patient identified, site marked, pre-op evaluation, timeout performed, IV checked, risks and benefits discussed and monitors and equipment checked  Epidural Patient position: sitting Prep: DuraPrep Patient monitoring: heart rate, cardiac monitor, continuous pulse ox and blood pressure Approach: midline Location: L2-L3 Injection technique: LOR saline  Needle:  Needle type: Tuohy  Needle gauge: 17 G Needle length: 9 cm Needle insertion depth: 9 cm Catheter size: 20 Guage Catheter at skin depth: 14 cm Test dose: negative and Other  Assessment Events: blood not aspirated, injection not painful, no injection resistance and negative IV test  Additional Notes Informed consent obtained prior to proceeding including risk of failure, 1% risk of PDPH, risk of minor discomfort and bruising.  Discussed rare but serious complications including epidural abscess, permanent nerve injury, epidural hematoma.  Discussed alternatives to epidural analgesia and patient desires to proceed.  Timeout performed pre-procedure verifying patient name, procedure, and platelet count.  Patient tolerated procedure well.

## 2016-12-15 NOTE — Progress Notes (Signed)
31 y.o. yo [phone]  with undesired fertility,status post vaginal delivery, desires permanent sterilization. Risks and benefits of procedure discussed with patient including permanence of method, bleeding, infection, injury to surrounding organs and need for additional procedures. Risk failure of 0.5-1% with increased risk of ectopic gestation if pregnancy occurs was also discussed with patient.

## 2016-12-15 NOTE — Anesthesia Postprocedure Evaluation (Signed)
Anesthesia Post Note  Patient: [facility]  Procedure(s) Performed: Procedure(s) (LRB): POST PARTUM TUBAL LIGATION (N/A)     Patient location during evaluation: PACU Anesthesia Type: Epidural Level of consciousness: awake and alert Pain management: pain level controlled Vital Signs Assessment: post-procedure vital signs reviewed and stable Respiratory status: spontaneous breathing and respiratory function stable Cardiovascular status: blood pressure returned to baseline and stable Postop Assessment: spinal receding Anesthetic complications: no    Last Vitals:  Vitals:   12/15/16 1630 12/15/16 1645  BP: (!) 99/53 (!) 107/48  Pulse: 71   Resp: 15   Temp:      Last Pain:  Vitals:   12/15/16 1415  TempSrc: Oral  PainSc: 0-No pain   Pain Goal: Patients Stated Pain Goal: 6 (12/15/16 0731)               Allen

## 2016-12-16 ENCOUNTER — Encounter (HOSPITAL_COMMUNITY): Payer: Self-pay | Admitting: Obstetrics and Gynecology

## 2016-12-16 NOTE — Progress Notes (Signed)
CSW received consult for hx of marijuana use.  Referral was screened out due to the following: ~MOB had no documented substance use after initial prenatal visit/+UPT. ~MOB had no positive drug screens after initial prenatal visit/+UPT. ~Baby's UDS is negative.  Please consult CSW if current concerns arise or by MOB's request.  CSW will monitor CDS results and make report to Child Protective Services if warranted.  [name] [name], MSW, LCSW-A Clinical Social Worker  [facility]  Office: [phone]   

## 2016-12-16 NOTE — Addendum Note (Signed)
Addendum  created 12/16/16 0743 by Raenette Rover, CRNA   Charge Capture section accepted, Sign clinical note

## 2016-12-16 NOTE — Addendum Note (Signed)
Addendum  created 12/16/16 0744 by Raenette Rover, CRNA   Sign clinical note

## 2016-12-16 NOTE — Progress Notes (Signed)
CSW acknowledges consult for hx of mood swings. CSW met with MOB at bedside to assess hx. MOB informed this Probation officer she began to experience mood swings only after IUD was placed. MOB notes she had it removed for that reason and then 60 days following the removal she became pregnant. MOB shoes no signs and expresses no concerns for mood swings; thus, this writer see's no further clinical intervention needed.   Akhilesh Sassone, MSW, LCSW-A Clinical Social Worker  [facility]  Office: [phone]

## 2016-12-16 NOTE — Progress Notes (Signed)
Post Partum Day 1, POD #1 (BTL) Subjective: up ad lib, voiding, tolerating PO and Pain at incision  Objective: Blood pressure 113/71, pulse 81, temperature 98.1 F (36.7 C), temperature source Oral, resp. rate 18, height 5\' 7"  (1.702 m), weight 217 lb (98.4 kg), last menstrual period 03/16/2016, SpO2 100 %, unknown if currently breastfeeding.  Physical Exam:  General: alert, cooperative and no distress Lochia: appropriate Uterine Fundus: firm Incision: no significant drainage DVT Evaluation: No evidence of DVT seen on physical exam.   Recent Labs  12/14/16 0810 12/15/16 0823  HGB 10.6* 11.6*  HCT 30.3* 33.5*    Assessment/Plan: Plan for discharge tomorrow and Breastfeeding   LOS: 2 days   [name] [name] 12/16/2016, 6:58 AM

## 2016-12-16 NOTE — Anesthesia Postprocedure Evaluation (Signed)
Anesthesia Post Note  Patient: [facility]  Procedure(s) Performed: * No procedures listed *     Patient location during evaluation: Mother Baby Anesthesia Type: Epidural Level of consciousness: awake, awake and alert, oriented and patient cooperative Pain management: pain level controlled Vital Signs Assessment: post-procedure vital signs reviewed and stable Respiratory status: spontaneous breathing, nonlabored ventilation and respiratory function stable Cardiovascular status: stable Postop Assessment: no headache, no backache, patient able to bend at knees and no signs of nausea or vomiting Anesthetic complications: no    Last Vitals:  Vitals:   12/16/16 0015 12/16/16 0545  BP: 136/80 113/71  Pulse: 98 81  Resp: 20 18  Temp: 37 C 36.7 C    Last Pain:  Vitals:   12/16/16 0545  TempSrc: Oral  PainSc:    Pain Goal: Patients Stated Pain Goal: 2 (12/15/16 2134)               Evany Schecter L

## 2016-12-16 NOTE — Anesthesia Postprocedure Evaluation (Signed)
Anesthesia Post Note  Patient: [facility]  Procedure(s) Performed: Procedure(s) (LRB): POST PARTUM TUBAL LIGATION (N/A)     Patient location during evaluation: Mother Baby Anesthesia Type: Epidural Level of consciousness: awake, awake and alert, oriented and patient cooperative Pain management: pain level controlled Vital Signs Assessment: post-procedure vital signs reviewed and stable Respiratory status: spontaneous breathing, nonlabored ventilation and respiratory function stable Cardiovascular status: stable Postop Assessment: no headache, no backache, patient able to bend at knees and no signs of nausea or vomiting Anesthetic complications: no    Last Vitals:  Vitals:   12/16/16 0015 12/16/16 0545  BP: 136/80 113/71  Pulse: 98 81  Resp: 20 18  Temp: 37 C 36.7 C    Last Pain:  Vitals:   12/16/16 0545  TempSrc: Oral  PainSc:    Pain Goal: Patients Stated Pain Goal: 2 (12/15/16 2134)               Saban Heinlen L

## 2016-12-17 MED ORDER — IBUPROFEN 600 MG PO TABS: 600.0000 mg | ORAL_TABLET | Freq: Four times a day (QID) | ORAL | 0 refills | Status: DC

## 2016-12-17 MED ORDER — OXYCODONE HCL 5 MG PO TABS: 5.0000 mg | ORAL_TABLET | ORAL | 0 refills | Status: DC | PRN

## 2016-12-17 NOTE — Lactation Note (Signed)
This note was copied from a baby's chart. Lactation Consultation Note  Patient Name: [name] [name] Today's Date: 12/17/2016 Reason for consult: Follow-up assessment Mom reports baby cluster feeding. Mom trying to latch at this visit but baby fussy and not sustaining good depth. Mom's breasts are filling. Assisted Mom with positioning to support breast better for baby to sustain depth with latch. Advised baby should be at breast 8-12 times in 24 hours and with feeding ques. Engorgement care reviewed if needed. Advised of OP services and support group. Encouraged to call for questions/concerns.   Maternal Data    Feeding Feeding Type: Breast Fed Length of feed: 10 min  LATCH Score/Interventions Latch: Grasps breast easily, tongue down, lips flanged, rhythmical sucking.  Audible Swallowing: A few with stimulation  Type of Nipple: Everted at rest and after stimulation  Comfort (Breast/Nipple): Filling, red/small blisters or bruises, mild/mod discomfort  Problem noted: Filling  Hold (Positioning): Assistance needed to correctly position infant at breast and maintain latch.  LATCH Score: 7  Lactation Tools Discussed/Used     Consult Status Consult Status: Complete Date: 12/17/16 Follow-up type: In-patient    Katrine Coho 12/17/2016, 11:02 AM

## 2016-12-17 NOTE — Discharge Instructions (Signed)

## 2016-12-17 NOTE — Discharge Summary (Signed)
OB Discharge Summary     Patient Name: [name] [name] DOB: 06/30/1985 MRN: [id number]  Date of admission: 12/14/2016 Delivering MD: Everrett Coombe   Date of discharge: 12/17/2016  Admitting diagnosis: INDUCTION Desires Sterilization Intrauterine pregnancy: [redacted]w[redacted]d     Secondary diagnosis:  Active Problems:   Chronic hypertension in pregnancy  Additional problems: desire for sterilization     Discharge diagnosis: Term Pregnancy Delivered                                                                                                Post partum procedures:postpartum tubal ligation  Augmentation: Pitocin, Cytotec and Foley Balloon  Complications: None  Hospital course:  Induction of Labor With Vaginal Delivery   31 y.o. yo [id number] at [redacted]w[redacted]d was admitted to the hospital 12/14/2016 for induction of labor.  Indication for induction: chronic hypertension.  Patient had an uncomplicated labor course as follows: Membrane Rupture Time/Date: 9:54 AM ,12/15/2016   Intrapartum Procedures: Episiotomy: None [1]                                         Lacerations:     Patient had delivery of a Viable infant.  Information for the patient's newborn:  [place], Girl Dana [[id number]]  Delivery Method: Vag-Spont   12/15/2016  Details of delivery can be found in separate delivery note.  Patient had a routine postpartum course. Patient is discharged home 12/17/16. BP wnl at discharge, continuing pre-pregnancy amlodipine 10 mg daily. BTL performed day of delivery.  Physical exam  Vitals:   12/16/16 0545 12/16/16 1110 12/16/16 1756 12/17/16 0501  BP: 113/71 133/75 136/79 125/71  Pulse: 81 81 78 80  Resp: 18 16 18 20   Temp: 98.1 F (36.7 C) 98.2 F (36.8 C) 98.3 F (36.8 C) 98 F (36.7 C)  TempSrc: Oral Oral Oral Oral  SpO2:    100%  Weight:      Height:       General: alert, cooperative and no distress Lochia: appropriate Uterine Fundus: firm Incision: clean, approximated, dry DVT  Evaluation: No cords or calf tenderness. No significant calf/ankle edema. Labs: Lab Results  Component Value Date   WBC 11.8 (H) 12/15/2016   HGB 11.6 (L) 12/15/2016   HCT 33.5 (L) 12/15/2016   MCV 93.3 12/15/2016   PLT 191 12/15/2016   CMP Latest Ref Rng & Units 06/29/2016  Glucose 65 - 99 mg/dL 85  BUN 7 - 25 mg/dL 3(L)  Creatinine 0.50 - 1.10 mg/dL 0.40(L)  Sodium 135 - 146 mmol/L 136  Potassium 3.5 - 5.3 mmol/L 3.6  Chloride 98 - 110 mmol/L 106  CO2 20 - 31 mmol/L 24  Calcium 8.6 - 10.2 mg/dL 8.4(L)  Total Protein 6.1 - 8.1 g/dL 6.2  Total Bilirubin 0.2 - 1.2 mg/dL 0.4  Alkaline Phos 33 - 115 U/L 45  AST 10 - 30 U/L 13  ALT 6 - 29 U/L 11    Discharge instruction: per After Visit Summary and "Baby and Me  Booklet".  After visit meds:  Allergies as of 12/17/2016      Reactions   Sulfa Antibiotics Swelling   Causes swelling on the face.   Shellfish Allergy Other (See Comments)   Stomach upset      Medication List    STOP taking these medications   acetaminophen 325 MG tablet Commonly known as:  TYLENOL   aspirin EC 81 MG tablet   omeprazole 20 MG tablet Commonly known as:  PRILOSEC OTC     TAKE these medications   amLODipine 10 MG tablet Commonly known as:  NORVASC Take 1 tablet (10 mg total) by mouth daily.   cetirizine 10 MG tablet Commonly known as:  ZYRTEC Take 1 tablet (10 mg total) by mouth daily.   montelukast 10 MG tablet Commonly known as:  SINGULAIR Take 1 tablet (10 mg total) by mouth at bedtime.   PNV PO Take by mouth.   triamcinolone cream 0.1 % Commonly known as:  KENALOG Apply 1 application topically 2 (two) times daily as needed. To affected areas for eczema.       Diet: routine diet  Activity: Advance as tolerated. Pelvic rest for 6 weeks.   Outpatient follow up: 2 days bp check, then 6 wks Follow up Appt:Future Appointments Date Time Provider [place]  01/18/2017 1:20 PM Leftwich-Kirby, Kathie Dike, CNM [place]    Follow up Visit:No Follow-up on file.  Postpartum contraception: Tubal Ligation  Newborn Data: Live born female  Birth Weight: 6 lb 6.1 oz (2895 g) APGAR: 8, 9  Baby Feeding: Breast Disposition: pending   12/17/2016 Desma Maxim, MD

## 2016-12-18 ENCOUNTER — Encounter (HOSPITAL_COMMUNITY): Payer: Self-pay

## 2016-12-21 NOTE — Progress Notes (Signed)
NST Note Date: 12/11/2016 FHT: 130 baseline, positive accelerations, negative deceleration, moderate variability Toco: negative Time: 25 minutes  A/P: rNST. Continue with current plan of care  Durene Romans MD Attending Center for [facility])

## 2017-01-18 ENCOUNTER — Ambulatory Visit: Payer: Medicaid Other | Admitting: Advanced Practice Midwife

## 2017-02-02 ENCOUNTER — Ambulatory Visit (INDEPENDENT_AMBULATORY_CARE_PROVIDER_SITE_OTHER): Payer: Medicaid Other | Admitting: Obstetrics and Gynecology

## 2017-02-02 ENCOUNTER — Encounter: Payer: Self-pay | Admitting: Obstetrics and Gynecology

## 2017-02-02 DIAGNOSIS — O169 Unspecified maternal hypertension, unspecified trimester: Secondary | ICD-10-CM

## 2017-02-02 MED ORDER — MONTELUKAST SODIUM 10 MG PO TABS: 10.0000 mg | ORAL_TABLET | Freq: Every day | ORAL | 3 refills | Status: DC

## 2017-02-02 MED ORDER — CETIRIZINE HCL 10 MG PO TABS: 10.0000 mg | ORAL_TABLET | Freq: Every day | ORAL | 3 refills | Status: DC

## 2017-02-02 MED ORDER — AMLODIPINE BESYLATE 10 MG PO TABS: 10.0000 mg | ORAL_TABLET | Freq: Every day | ORAL | 3 refills | Status: DC

## 2017-02-02 NOTE — Progress Notes (Signed)
Obstetrics Visit Postpartum Visit  Appointment Date: 02/02/2017  OBGYN Clinic: Center for [facility]  Primary Care Provider: Arnoldo Morale  Chief Complaint:  Chief Complaint  Patient presents with  . Postpartum Care    History of Present Illness: [name] [name] is a 31 y.o. African-American [phone] (Patient's last menstrual period was 01/14/2017 (exact date).), seen for the above chief complaint. Her past medical history is significant for cHTN, BMI 30s, h/o tobacco abuse   She is s/p SVD/intact perineum on 6/29 and a ppBTL; she was discharged to home on PPD#2. Her surg path of the BTL was confirmatory  No HA, visual s/s, chest pain, SOB, abdominal pain.   Vaginal bleeding or discharge: Yes, she had a normal period last week. No current bleeding Breast or formula feeding: breast Intercourse: No  Contraception after delivery: ppBTL PP depression s/s: No  Any bowel or bladder issues: No  Pap smear: no abnormalities (date: 2016)  Review of Systems:  as noted in the History of Present Illness.  Medications [name] had no medications administered during this visit. Current Outpatient Prescriptions  Medication Sig Dispense Refill  . amLODipine (NORVASC) 10 MG tablet Take 1 tablet (10 mg total) by mouth daily. 30 tablet 3  . cetirizine (ZYRTEC) 10 MG tablet Take 1 tablet (10 mg total) by mouth daily. 30 tablet 3  . montelukast (SINGULAIR) 10 MG tablet Take 1 tablet (10 mg total) by mouth at bedtime. 30 tablet 3  . Prenatal Vit w/Fe-Methylfol-FA (PNV PO) Take by mouth.     No current facility-administered medications for this visit.     Allergies Sulfa antibiotics and Shellfish allergy  Physical Exam:  BP (!) 140/104   Pulse 60   LMP 01/14/2017 (Exact Date)   Breastfeeding? Yes  There is no height or weight on file to calculate BMI. General appearance: Well nourished, well developed female in no acute distress.  Cardiovascular: normal s1 and s2.  No  murmurs, rubs or gallops. Respiratory:  Clear to auscultation bilateral. Normal respiratory effort Abdomen: positive bowel sounds and no masses, hernias; diffusely non tender to palpation, non distended Neuro/Psych:  Normal mood and affect.  Skin:  Warm and dry.   Laboratory: none  PP Depression Screening:  EPDS 2, #10: zero  Assessment: pt doing well  Plan:  Routine care. Will CC PCP but pt asymptomatic and should be out of the period to worry about PP pre-eclampsia, and she is on her pre pregnancy dose. Pt told to contact PCP for annual visit sometime in the next few months.   RTC PRN  Durene Romans MD Attending Center for [facility])

## 2017-07-25 ENCOUNTER — Ambulatory Visit: Payer: Medicaid Other | Attending: Family Medicine | Admitting: Family Medicine

## 2017-07-25 ENCOUNTER — Encounter: Payer: Self-pay | Admitting: Family Medicine

## 2017-07-25 VITALS — BP 148/96 | HR 74 | Temp 98.0°F | Ht 67.0 in | Wt 217.0 lb

## 2017-07-25 DIAGNOSIS — E739 Lactose intolerance, unspecified: Secondary | ICD-10-CM | POA: Diagnosis not present

## 2017-07-25 DIAGNOSIS — Z91013 Allergy to seafood: Secondary | ICD-10-CM | POA: Diagnosis not present

## 2017-07-25 DIAGNOSIS — Z79899 Other long term (current) drug therapy: Secondary | ICD-10-CM | POA: Insufficient documentation

## 2017-07-25 DIAGNOSIS — I10 Essential (primary) hypertension: Secondary | ICD-10-CM

## 2017-07-25 DIAGNOSIS — J45909 Unspecified asthma, uncomplicated: Secondary | ICD-10-CM | POA: Diagnosis not present

## 2017-07-25 DIAGNOSIS — Z882 Allergy status to sulfonamides status: Secondary | ICD-10-CM | POA: Insufficient documentation

## 2017-07-25 DIAGNOSIS — K219 Gastro-esophageal reflux disease without esophagitis: Secondary | ICD-10-CM | POA: Diagnosis not present

## 2017-07-25 MED ORDER — MONTELUKAST SODIUM 10 MG PO TABS: 10.0000 mg | ORAL_TABLET | Freq: Every day | ORAL | 6 refills | Status: DC

## 2017-07-25 MED ORDER — CETIRIZINE HCL 10 MG PO TABS: 10.0000 mg | ORAL_TABLET | Freq: Every day | ORAL | 6 refills | Status: DC

## 2017-07-25 MED ORDER — OMEPRAZOLE 20 MG PO CPDR: 20.0000 mg | DELAYED_RELEASE_CAPSULE | Freq: Every day | ORAL | 6 refills | Status: DC

## 2017-07-25 MED ORDER — AMLODIPINE BESYLATE 10 MG PO TABS: 10.0000 mg | ORAL_TABLET | Freq: Every day | ORAL | 6 refills | Status: DC

## 2017-07-25 NOTE — Progress Notes (Signed)
Subjective:  Patient ID: [name] [name], female    DOB: March 05, 1986  Age: 32 y.o. MRN: [id number]  CC: Hypertension and Medication Refill   HPI [name] [name] is a 32 year old female with a history of hypertension who is 7 months postpartum and presents today for continuity of care. She is requesting refill of her antihypertensive, Singulair and Zyrtec.  Her blood pressure is slightly elevated today but she informs me her blood pressures at home have been controlled and she endorses compliance with her antihypertensive. She complains of reflux which is worsened by eating chicken, beef and despite modification of her diet her symptoms still persist needing to having to take Tums every day. Denies nausea, vomiting, diarrhea or constipation.  Past Medical History:  Diagnosis Date  . Asthma, allergic    uses inhaler prn - infrequently  . Bilateral thoracic back pain 09/13/2015  . Eczema   . Hypertension   . Lactose intolerance   . Mature cystic teratoma of ovary   . Mood swings 06/01/2015  . Seasonal allergies     Past Surgical History:  Procedure Laterality Date  . TUBAL LIGATION N/A 12/15/2016   Procedure: POST PARTUM TUBAL LIGATION;  Surgeon: Mora Bellman, MD;  Location: [facility];  Service: Gynecology;  Laterality: N/A;  . WISDOM TOOTH EXTRACTION      Allergies  Allergen Reactions  . Sulfa Antibiotics Swelling    Causes swelling on the face.  . Shellfish Allergy Other (See Comments)    Stomach upset     Outpatient Medications Prior to Visit  Medication Sig Dispense Refill  . triamcinolone cream (KENALOG) 0.1 % Apply 1 application topically 2 (two) times daily as needed. To affected areas for eczema. 80 g 1  . amLODipine (NORVASC) 10 MG tablet Take 1 tablet (10 mg total) by mouth daily. 30 tablet 3  . cetirizine (ZYRTEC) 10 MG tablet Take 1 tablet (10 mg total) by mouth daily. 30 tablet 3  . montelukast (SINGULAIR) 10 MG tablet Take 1 tablet (10 mg total) by mouth at  bedtime. 30 tablet 3  . ibuprofen (ADVIL,MOTRIN) 600 MG tablet Take 1 tablet (600 mg total) by mouth every 6 (six) hours. (Patient not taking: Reported on 02/02/2017) 30 tablet 0  . oxyCODONE (OXY IR/ROXICODONE) 5 MG immediate release tablet Take 1 tablet (5 mg total) by mouth every 4 (four) hours as needed for moderate pain or severe pain. (Patient not taking: Reported on 02/02/2017) 30 tablet 0  . Prenatal Vit w/Fe-Methylfol-FA (PNV PO) Take by mouth.     No facility-administered medications prior to visit.     ROS Review of Systems  Constitutional: Negative for activity change, appetite change and fatigue.  HENT: Negative for congestion, sinus pressure and sore throat.   Eyes: Negative for visual disturbance.  Respiratory: Negative for cough, chest tightness, shortness of breath and wheezing.   Cardiovascular: Negative for chest pain and palpitations.  Gastrointestinal: Negative for abdominal distention, abdominal pain and constipation.  Endocrine: Negative for polydipsia.  Genitourinary: Negative for dysuria and frequency.  Musculoskeletal: Negative for arthralgias and back pain.  Skin: Negative for rash.  Neurological: Negative for tremors, light-headedness and numbness.  Hematological: Does not bruise/bleed easily.  Psychiatric/Behavioral: Negative for agitation and behavioral problems.    Objective:  BP (!) 148/96   Pulse 74   Temp 98 F (36.7 C) (Oral)   Ht _0  (1.702 m)   Wt 217 lb (98.4 kg)   LMP 07/20/2017   SpO2 97%   BMI  33.99 kg/m   BP/Weight 07/25/2017 4/83/2346 01/25/7372  Systolic BP 081 683 870  Diastolic BP 96 658 71  Wt. (Lbs) 217 - -  BMI 33.99 - -      Physical Exam  Constitutional: She is oriented to person, place, and time. She appears well-developed and well-nourished.  Cardiovascular: Normal rate, normal heart sounds and intact distal pulses.  No murmur heard. Pulmonary/Chest: Effort normal and breath sounds normal. She has no wheezes. She has  no rales. She exhibits no tenderness.  Abdominal: Soft. Bowel sounds are normal. She exhibits no distension and no mass. There is no tenderness.  Musculoskeletal: Normal range of motion.  Neurological: She is alert and oriented to person, place, and time.     Assessment & Plan:   1. Essential hypertension Controlled Patient states blood pressure has been controlled previously We will make no regimen changes today Counseled on blood pressure goal of less than 130/80, low-sodium, DASH diet, medication compliance, 150 minutes of moderate intensity exercise per week. Discussed medication compliance, adverse effects. - amLODipine (NORVASC) 10 MG tablet; Take 1 tablet (10 mg total) by mouth daily.  Dispense: 30 tablet; Refill: 6 - CMP14+EGFR; Future - Lipid panel; Future  2. Gastroesophageal reflux disease without esophagitis Uncontrolled Commence omeprazole Avoid late meals and avoid recumbency up to 2 hours post meals.   Meds ordered this encounter  Medications  . montelukast (SINGULAIR) 10 MG tablet    Sig: Take 1 tablet (10 mg total) by mouth at bedtime.    Dispense:  30 tablet    Refill:  6  . amLODipine (NORVASC) 10 MG tablet    Sig: Take 1 tablet (10 mg total) by mouth daily.    Dispense:  30 tablet    Refill:  6  . cetirizine (ZYRTEC) 10 MG tablet    Sig: Take 1 tablet (10 mg total) by mouth daily.    Dispense:  30 tablet    Refill:  6  . omeprazole (PRILOSEC) 20 MG capsule    Sig: Take 1 capsule (20 mg total) by mouth daily.    Dispense:  30 capsule    Refill:  6    Follow-up: Return in about 6 months (around 01/22/2018) for Follow-up of hypertension and GERD.   Charlott Rakes MD

## 2017-07-25 NOTE — Patient Instructions (Signed)
Food Choices for Gastroesophageal Reflux Disease, Adult When you have gastroesophageal reflux disease (GERD), the foods you eat and your eating habits are very important. Choosing the right foods can help ease your discomfort. What guidelines do I need to follow?  Choose fruits, vegetables, whole grains, and low-fat dairy products.  Choose low-fat meat, fish, and poultry.  Limit fats such as oils, salad dressings, butter, nuts, and avocado.  Keep a food diary. This helps you identify foods that cause symptoms.  Avoid foods that cause symptoms. These may be different for everyone.  Eat small meals often instead of 3 large meals a day.  Eat your meals slowly, in a place where you are relaxed.  Limit fried foods.  Cook foods using methods other than frying.  Avoid drinking alcohol.  Avoid drinking large amounts of liquids with your meals.  Avoid bending over or lying down until 2-3 hours after eating. What foods are not recommended? These are some foods and drinks that may make your symptoms worse: Vegetables  Tomatoes. Tomato juice. Tomato and spaghetti sauce. Chili peppers. Onion and garlic. Horseradish. Fruits  Oranges, grapefruit, and lemon (fruit and juice). Meats  High-fat meats, fish, and poultry. This includes hot dogs, ribs, ham, sausage, salami, and bacon. Dairy  Whole milk and chocolate milk. Sour cream. Cream. Butter. Ice cream. Cream cheese. Drinks  Coffee and tea. Bubbly (carbonated) drinks or energy drinks. Condiments  Hot sauce. Barbecue sauce. Sweets/Desserts  Chocolate and cocoa. Donuts. Peppermint and spearmint. Fats and Oils  High-fat foods. This includes French fries and potato chips. Other  Vinegar. Strong spices. This includes black pepper, white pepper, red pepper, cayenne, curry powder, cloves, ginger, and chili powder. The items listed above may not be a complete list of foods and drinks to avoid. Contact your dietitian for more information.    This information is not intended to replace advice given to you by your health care provider. Make sure you discuss any questions you have with your health care provider. Document Released: 12/05/2011 Document Revised: 11/11/2015 Document Reviewed: 04/09/2013 Elsevier Interactive Patient Education  2017 Elsevier Inc.  

## 2017-08-01 ENCOUNTER — Ambulatory Visit: Payer: Medicaid Other | Attending: Family Medicine

## 2017-08-01 DIAGNOSIS — I10 Essential (primary) hypertension: Secondary | ICD-10-CM | POA: Insufficient documentation

## 2017-08-01 NOTE — Progress Notes (Signed)
Patient here for lab visit only 

## 2017-08-02 LAB — CMP14+EGFR
ALT: 18 IU/L (ref 0–32)
AST: 15 IU/L (ref 0–40)
Albumin/Globulin Ratio: 1.6 (ref 1.2–2.2)
Albumin: 4.4 g/dL (ref 3.5–5.5)
Alkaline Phosphatase: 90 IU/L (ref 39–117)
BUN/Creatinine Ratio: 9 (ref 9–23)
BUN: 6 mg/dL (ref 6–20)
Bilirubin Total: 0.4 mg/dL (ref 0.0–1.2)
CALCIUM: 9.2 mg/dL (ref 8.7–10.2)
CHLORIDE: 104 mmol/L (ref 96–106)
CO2: 25 mmol/L (ref 20–29)
Creatinine, Ser: 0.7 mg/dL (ref 0.57–1.00)
GFR calc non Af Amer: 116 mL/min/{1.73_m2} (ref >59)
GFR, EST AFRICAN AMERICAN: 134 mL/min/{1.73_m2} (ref >59)
GLUCOSE: 82 mg/dL (ref 65–99)
Globulin, Total: 2.7 g/dL (ref 1.5–4.5)
Potassium: 4.4 mmol/L (ref 3.5–5.2)
Sodium: 142 mmol/L (ref 134–144)
TOTAL PROTEIN: 7.1 g/dL (ref 6.0–8.5)

## 2017-08-02 LAB — LIPID PANEL
CHOL/HDL RATIO: 3.2 ratio (ref 0.0–4.4)
Cholesterol, Total: 168 mg/dL (ref 100–199)
HDL: 53 mg/dL (ref >39)
LDL CALC: 105 mg/dL — AB (ref 0–99)
Triglycerides: 49 mg/dL (ref 0–149)
VLDL Cholesterol Cal: 10 mg/dL (ref 5–40)

## 2017-09-11 DIAGNOSIS — K219 Gastro-esophageal reflux disease without esophagitis: Secondary | ICD-10-CM

## 2017-10-23 ENCOUNTER — Encounter: Payer: Self-pay | Admitting: *Deleted

## 2017-10-25 ENCOUNTER — Ambulatory Visit: Payer: Medicaid Other | Attending: Family Medicine | Admitting: Physician Assistant

## 2017-10-25 VITALS — BP 130/89 | HR 83 | Temp 98.7°F | Resp 16 | Ht 67.0 in | Wt 215.8 lb

## 2017-10-25 DIAGNOSIS — Z79899 Other long term (current) drug therapy: Secondary | ICD-10-CM | POA: Diagnosis not present

## 2017-10-25 DIAGNOSIS — J45909 Unspecified asthma, uncomplicated: Secondary | ICD-10-CM | POA: Insufficient documentation

## 2017-10-25 DIAGNOSIS — E739 Lactose intolerance, unspecified: Secondary | ICD-10-CM | POA: Diagnosis not present

## 2017-10-25 DIAGNOSIS — Z91013 Allergy to seafood: Secondary | ICD-10-CM | POA: Insufficient documentation

## 2017-10-25 DIAGNOSIS — R52 Pain, unspecified: Secondary | ICD-10-CM

## 2017-10-25 DIAGNOSIS — Z882 Allergy status to sulfonamides status: Secondary | ICD-10-CM | POA: Insufficient documentation

## 2017-10-25 DIAGNOSIS — I10 Essential (primary) hypertension: Secondary | ICD-10-CM | POA: Diagnosis not present

## 2017-10-25 MED ORDER — IBUPROFEN 600 MG PO TABS: 600.0000 mg | ORAL_TABLET | Freq: Four times a day (QID) | ORAL | 0 refills | Status: DC

## 2017-10-25 NOTE — Patient Instructions (Signed)

## 2017-10-25 NOTE — Progress Notes (Signed)
Patient ID: [name] [name], female   DOB: 07-06-1985, 32 y.o.   MRN: [id number] Back, shoulder, and neck pain 77 month old baby   [name] [name], is a 32 y.o. female  [id number]  [id number]  DOB - 1986-04-02  Subjective:  Chief Complaint and HPI: [name] [name] is a 32 y.o. female here today with various aches and pains in her neck, arms, shoulders and lower back.  No numbness/weakness.  She is R hand dominant.  Has a 4 month old, almost 3 year ol, and 32 year old.  Constantly bending/stooping/lifting in and out of car seat, etc. Currently nursing the 52 month old a few times a day to supplement regular meals/comfort.  No specific injury   ROS:   Constitutional:  No f/c, No night sweats, No unexplained weight loss. EENT:  No vision changes, No blurry vision, No hearing changes. No mouth, throat, or ear problems.  Respiratory: No cough, No SOB Cardiac: No CP, no palpitations GI:  No abd pain, No N/V/D. GU: No Urinary s/sx Musculoskeletal: pain as above Neuro: No headache, no dizziness, no motor weakness.  Skin: No rash Endocrine:  No polydipsia. No polyuria.  Psych: Denies SI/HI  No problems updated.  ALLERGIES: Allergies  Allergen Reactions  . Sulfa Antibiotics Swelling    Causes swelling on the face.  . Shellfish Allergy Other (See Comments)    Stomach upset    PAST MEDICAL HISTORY: Past Medical History:  Diagnosis Date  . Asthma, allergic    uses inhaler prn - infrequently  . Bilateral thoracic back pain 09/13/2015  . Eczema   . Hypertension   . Lactose intolerance   . Mature cystic teratoma of ovary   . Mood swings 06/01/2015  . Seasonal allergies     MEDICATIONS AT HOME: Prior to Admission medications   Medication Sig Start Date End Date Taking? Authorizing Provider  amLODipine (NORVASC) 10 MG tablet Take 1 tablet (10 mg total) by mouth daily. 07/25/17  Yes Charlott Rakes, MD  cetirizine (ZYRTEC) 10 MG tablet Take 1 tablet (10 mg total) by mouth  daily. 07/25/17  Yes Newlin, Charlane Ferretti, MD  montelukast (SINGULAIR) 10 MG tablet Take 1 tablet (10 mg total) by mouth at bedtime. 07/25/17  Yes Charlott Rakes, MD  omeprazole (PRILOSEC) 20 MG capsule Take 1 capsule (20 mg total) by mouth daily. 07/25/17  Yes Charlott Rakes, MD  Prenatal Vit w/Fe-Methylfol-FA (PNV PO) Take by mouth.   Yes [provider]  ibuprofen (ADVIL,MOTRIN) 600 MG tablet Take 1 tablet (600 mg total) by mouth every 6 (six) hours. X 1week then prn pain 10/25/17   Argentina Donovan, PA-C  triamcinolone cream (KENALOG) 0.1 % Apply 1 application topically 2 (two) times daily as needed. To affected areas for eczema. Patient not taking: Reported on 10/25/2017 06/22/16   Donnamae Jude, MD     Objective:  EXAM:   Vitals:   10/25/17 1444  BP: 130/89  Pulse: 83  Resp: 16  Temp: 98.7 F (37.1 C)  TempSrc: Oral  SpO2: 97%  Weight: 215 lb 12.8 oz (97.9 kg)  Height: 5\' 7"  (1.702 m)    General appearance : A&OX3. NAD. Non-toxic-appearing HEENT: Atraumatic and Normocephalic.  PERRLA. EOM intact.  Neck: supple, no JVD. No cervical lymphadenopathy. No thyromegaly Chest/Lungs:  Breathing-non-labored, Good air entry bilaterally, breath sounds normal without rales, rhonchi, or wheezing  CVS: S1 S2 regular, no murmurs, gallops, rubs  Back:  Not spiny TTP along entire spine.  There is paraspinus TTP  in thoracic and lumbar spine.  There eis trapezius spasm B.  Joints are stable.  Full S&ROM of U/LE.  DTR <1+=throughout U/Lext.   Extremities: Bilateral Lower Ext shows no edema, both legs are warm to touch with = pulse throughout Neurology:  CN II-XII grossly intact, Non focal.   Psych:  TP linear. J/I WNL. Normal speech. Appropriate eye contact and affect.  Skin:  No Rash  Data Review [name] Results  Component Value Date   HGBA1C 4.5 06/29/2016   HGBA1C CANCELED 06/22/2016   HGBA1C 4.80 06/01/2015     Assessment & Plan   1. Body aches No red flags of back/neck/extremities.     - ibuprofen (ADVIL,MOTRIN) 600 MG tablet; Take 1 tablet (600 mg total) by mouth every 6 (six) hours. X 1week then prn pain  Dispense: 90 tablet; Refill: 0 Once she finishes nursing, it would be reasonable to add methocarbamol or other muscle relaxer.  I have encouraged/instructed her on proper lifting and stretching.    Patient have been counseled extensively about nutrition and exercise  Return if symptoms worsen or fail to improve.  The patient was given clear instructions to go to ER or return to medical center if symptoms don't improve, worsen or new problems develop. The patient verbalized understanding. The patient was told to call to get [name] results if they haven't heard anything in the next week.     Freeman Caldron, PA-C [facility] and [facility], [place]   10/25/2017, 2:59 PM

## 2017-10-25 NOTE — Progress Notes (Signed)
Pt. Is here for neck pain and lower back pain.

## 2018-03-06 ENCOUNTER — Other Ambulatory Visit: Payer: Self-pay | Admitting: Family Medicine

## 2018-03-06 DIAGNOSIS — I10 Essential (primary) hypertension: Secondary | ICD-10-CM

## 2018-04-11 ENCOUNTER — Ambulatory Visit: Payer: Medicaid Other

## 2018-04-12 ENCOUNTER — Encounter: Payer: Self-pay | Admitting: Family Medicine

## 2018-04-12 ENCOUNTER — Other Ambulatory Visit: Payer: Self-pay

## 2018-04-12 ENCOUNTER — Ambulatory Visit: Payer: Medicaid Other | Attending: Family Medicine | Admitting: Family Medicine

## 2018-04-12 VITALS — BP 134/91 | HR 83 | Temp 98.8°F | Resp 18 | Ht 67.0 in | Wt 220.0 lb

## 2018-04-12 DIAGNOSIS — Z23 Encounter for immunization: Secondary | ICD-10-CM | POA: Diagnosis not present

## 2018-04-12 DIAGNOSIS — Z9851 Tubal ligation status: Secondary | ICD-10-CM | POA: Diagnosis not present

## 2018-04-12 DIAGNOSIS — Z8249 Family history of ischemic heart disease and other diseases of the circulatory system: Secondary | ICD-10-CM | POA: Diagnosis not present

## 2018-04-12 DIAGNOSIS — H1013 Acute atopic conjunctivitis, bilateral: Secondary | ICD-10-CM

## 2018-04-12 DIAGNOSIS — H1010 Acute atopic conjunctivitis, unspecified eye: Secondary | ICD-10-CM | POA: Diagnosis not present

## 2018-04-12 DIAGNOSIS — J45909 Unspecified asthma, uncomplicated: Secondary | ICD-10-CM | POA: Insufficient documentation

## 2018-04-12 DIAGNOSIS — L309 Dermatitis, unspecified: Secondary | ICD-10-CM | POA: Diagnosis not present

## 2018-04-12 DIAGNOSIS — F17210 Nicotine dependence, cigarettes, uncomplicated: Secondary | ICD-10-CM | POA: Diagnosis not present

## 2018-04-12 DIAGNOSIS — I10 Essential (primary) hypertension: Secondary | ICD-10-CM | POA: Diagnosis present

## 2018-04-12 DIAGNOSIS — J309 Allergic rhinitis, unspecified: Secondary | ICD-10-CM | POA: Diagnosis not present

## 2018-04-12 MED ORDER — CLOBETASOL PROPIONATE 0.05 % EX OINT: 1.0000 "application " | TOPICAL_OINTMENT | Freq: Two times a day (BID) | CUTANEOUS | 6 refills | Status: DC

## 2018-04-12 MED ORDER — AMLODIPINE BESYLATE 10 MG PO TABS: 10.0000 mg | ORAL_TABLET | Freq: Every day | ORAL | 6 refills | Status: DC

## 2018-04-12 NOTE — Progress Notes (Signed)
Subjective:    Patient ID: [name] [name], female    DOB: 05/22/1986, 32 y.o.   MRN: [id number]  HPI       32 year old female seen in follow-up of hypertension, allergic rhinitis and eczema.  Patient reports that she will need a refill of her blood pressure medication.  She denies any headaches or dizziness related to her blood pressure.  Patient feels that her blood pressure is controlled.  Patient however states that she continues to have issues with allergic rhinitis- runny nose, nasal congestion, postnasal drainage and itchy nose.  Patient also with eye issues related to allergies including watery eyes, eye redness, itchy eyes and puffy eyelids.  Patient reports that none of the medications that she currently takes or that she has taken in the past have adequately controlled her allergic rhinitis symptoms or her eye issues.  Patient reports that her children also have allergic rhinitis and eczema.  Patient reports that she was most recently prescribed triamcinolone cream for her eczema and she states that this does not help at all.  Patient has found that using over-the-counter Aquaphor does bring some relief to the itchiness associated with her eczema.  Patient cannot use Aveeno which she states that this medication gives her skin a burning sensation.  Patient would like a different steroid cream to see if this can help calm down and control symptoms of her eczema. Past Medical History:  Diagnosis Date  . Asthma, allergic    uses inhaler prn - infrequently  . Bilateral thoracic back pain 09/13/2015  . Eczema   . Hypertension   . Lactose intolerance   . Mature cystic teratoma of ovary   . Mood swings 06/01/2015  . Seasonal allergies    Past Surgical History:  Procedure Laterality Date  . TUBAL LIGATION N/A 12/15/2016   Procedure: POST PARTUM TUBAL LIGATION;  Surgeon: Mora Bellman, MD;  Location: [facility];  Service: Gynecology;  Laterality: N/A;  . WISDOM TOOTH EXTRACTION     Family  History  Adopted: Yes  Problem Relation Age of Onset  . Hypertension Father   . Hypertension Maternal Aunt   . Hypertension Maternal Uncle   . Hypertension Maternal Grandmother   . Eczema Maternal Grandmother   . Hypertension Maternal Grandfather   . Hypertension Paternal Grandmother   . Hypertension Mother   . Eczema Mother    Social History   Tobacco Use  . Smoking status: Current Every Day Smoker    Packs/day: 0.25    Types: Cigarettes  . Smokeless tobacco: Former Network engineer Use Topics  . Alcohol use: No  . Drug use: No   Allergies  Allergen Reactions  . Sulfa Antibiotics Swelling    Causes swelling on the face.  . Shellfish Allergy Other (See Comments)    Stomach upset     Review of Systems  Constitutional: Positive for fatigue. Negative for diaphoresis and fever.  HENT: Positive for postnasal drip, rhinorrhea, sinus pressure and sneezing. Negative for sinus pain, sore throat, trouble swallowing and voice change.   Eyes: Positive for discharge, redness and itching. Negative for photophobia and visual disturbance.  Respiratory: Positive for cough (Related to postnasal drainage). Negative for shortness of breath.   Cardiovascular: Negative for chest pain, palpitations and leg swelling.  Gastrointestinal: Negative for abdominal pain and nausea.  Endocrine: Negative for polydipsia, polyphagia and polyuria.  Genitourinary: Negative for dysuria and frequency.  Musculoskeletal: Negative for arthralgias, back pain, gait problem, joint swelling and myalgias.  Skin:  Positive for color change and rash. Negative for wound.  Neurological: Negative for dizziness, light-headedness and headaches.       Objective:   Physical Exam BP (!) 134/91   Pulse 83   Temp 98.8 F (37.1 C) (Oral)   Resp 18   Ht 5\' 7"  (1.702 m)   Wt 220 lb (99.8 kg)   SpO2 100%   BMI 34.46 kg/m Nurse's notes and vital signs reviewed General-well-nourished, well-developed overweight female in no  acute distress but patient with watery eyes and a nasal quality to her voice EENT- patient with watery eyes, mild injection of the conjunctiva, patient's TMs are dull, patient with moderate edema of the nasal mucosa which is pale and boggy, patient with mild posterior pharynx erythema and cobblestoning Neck-supple, patient with borderline thyromegaly, no lymphadenopathy Cardiovascular-regular rate and rhythm Lungs-clear to auscultation bilaterally Abdomen-soft, nontender Back-no CVA tenderness Extremities-no edema Skin- patient with hyperpigmented, slightly dry skin on the bilateral anterior lower legs/ankles and on the forearms and some mild dry skin medially beneath the eyes medial to the nose bilaterally      Assessment & Plan:  1. Essential hypertension Patient's blood pressures reasonably controlled on amlodipine which she will continue.  Diet rich in fruits and vegetables and low in processed foods as well as regular exercise encouraged - amLODipine (NORVASC) 10 MG tablet; Take 1 tablet (10 mg total) by mouth daily. To lower blood pressure  Dispense: 30 tablet; Refill: 6  2. Allergic rhinoconjunctivitis of both eyes Patient with complaint of chronic issues with watery, itchy eyes as well as runny nose and patient is being referred to allergist for further evaluation and treatment - Ambulatory referral to Allergy  3. Allergic rhinitis, unspecified seasonality, unspecified trigger Patient with complaint of chronic issues with allergic rhinitis which tends to be worse with seasonal changes/changes in temperature.  Patient is having insufficient relief with current medicines including cetirizine and Singulair.  Patient will be referred to an allergist for further evaluation and treatment - Ambulatory referral to Allergy  5. Need for immunization against influenza Patient was offered and agreed to have influenza immunization at today's visit - Flu Vaccine QUAD 36+ mos IM  4. Eczema,  unspecified type Patient reports that she is getting no relief with current medication that she uses for eczema.  Patient is encouraged to use an over-the-counter strength hydrocortisone for dry areas on the face and patient was told that the use of any steroid creams can cause hypopigmentation and thinning of the skin.  Patient is given prescription for clobetasol ointment and is encouraged to continue the use of Aquaphor to moisturize the skin. - clobetasol ointment (TEMOVATE) 0.05 %; Apply 1 application topically 2 (two) times daily.  Dispense: 60 g; Refill: 6  An After Visit Summary was printed and given to the patient.  Return in about 6 months (around 10/12/2018).

## 2018-04-12 NOTE — Patient Instructions (Signed)

## 2018-04-12 NOTE — Progress Notes (Signed)
Patient stated she was wanting to try something else for her eczema  Flu shot: yes

## 2018-04-19 DIAGNOSIS — I639 Cerebral infarction, unspecified: Secondary | ICD-10-CM

## 2018-04-19 HISTORY — DX: Cerebral infarction, unspecified: I63.9

## 2018-05-09 ENCOUNTER — Emergency Department (HOSPITAL_COMMUNITY): Payer: Medicaid Other

## 2018-05-09 ENCOUNTER — Other Ambulatory Visit: Payer: Self-pay

## 2018-05-09 ENCOUNTER — Encounter (HOSPITAL_COMMUNITY): Payer: Self-pay

## 2018-05-09 ENCOUNTER — Encounter (HOSPITAL_COMMUNITY): Payer: Self-pay | Admitting: Emergency Medicine

## 2018-05-09 ENCOUNTER — Inpatient Hospital Stay (HOSPITAL_COMMUNITY)
Admission: EM | Admit: 2018-05-09 | Discharge: 2018-05-27 | DRG: 020 | Disposition: A | Discharge status: Rehabilitation Facility | Payer: Medicaid Other | Attending: Neurosurgery | Admitting: Neurosurgery

## 2018-05-09 ENCOUNTER — Ambulatory Visit (INDEPENDENT_AMBULATORY_CARE_PROVIDER_SITE_OTHER)
Admission: EM | Admit: 2018-05-09 | Discharge: 2018-05-09 | Disposition: A | Discharge status: Home / Self Care | Payer: Medicaid Other | Source: Home / Self Care

## 2018-05-09 DIAGNOSIS — G8194 Hemiplegia, unspecified affecting left nondominant side: Secondary | ICD-10-CM | POA: Diagnosis not present

## 2018-05-09 DIAGNOSIS — Z978 Presence of other specified devices: Secondary | ICD-10-CM

## 2018-05-09 DIAGNOSIS — G8191 Hemiplegia, unspecified affecting right dominant side: Secondary | ICD-10-CM | POA: Diagnosis not present

## 2018-05-09 DIAGNOSIS — Y95 Nosocomial condition: Secondary | ICD-10-CM | POA: Diagnosis not present

## 2018-05-09 DIAGNOSIS — R4701 Aphasia: Secondary | ICD-10-CM | POA: Diagnosis not present

## 2018-05-09 DIAGNOSIS — G919 Hydrocephalus, unspecified: Secondary | ICD-10-CM | POA: Diagnosis present

## 2018-05-09 DIAGNOSIS — R491 Aphonia: Secondary | ICD-10-CM | POA: Diagnosis not present

## 2018-05-09 DIAGNOSIS — R402142 Coma scale, eyes open, spontaneous, at arrival to emergency department: Secondary | ICD-10-CM | POA: Diagnosis present

## 2018-05-09 DIAGNOSIS — J9600 Acute respiratory failure, unspecified whether with hypoxia or hypercapnia: Secondary | ICD-10-CM

## 2018-05-09 DIAGNOSIS — K219 Gastro-esophageal reflux disease without esophagitis: Secondary | ICD-10-CM | POA: Diagnosis present

## 2018-05-09 DIAGNOSIS — D6489 Other specified anemias: Secondary | ICD-10-CM | POA: Diagnosis present

## 2018-05-09 DIAGNOSIS — J9690 Respiratory failure, unspecified, unspecified whether with hypoxia or hypercapnia: Secondary | ICD-10-CM

## 2018-05-09 DIAGNOSIS — Z720 Tobacco use: Secondary | ICD-10-CM | POA: Diagnosis not present

## 2018-05-09 DIAGNOSIS — R29700 NIHSS score 0: Secondary | ICD-10-CM | POA: Diagnosis present

## 2018-05-09 DIAGNOSIS — R51 Headache: Secondary | ICD-10-CM | POA: Diagnosis not present

## 2018-05-09 DIAGNOSIS — E739 Lactose intolerance, unspecified: Secondary | ICD-10-CM | POA: Diagnosis present

## 2018-05-09 DIAGNOSIS — Y838 Other surgical procedures as the cause of abnormal reaction of the patient, or of later complication, without mention of misadventure at the time of the procedure: Secondary | ICD-10-CM | POA: Diagnosis not present

## 2018-05-09 DIAGNOSIS — I671 Cerebral aneurysm, nonruptured: Secondary | ICD-10-CM

## 2018-05-09 DIAGNOSIS — J9811 Atelectasis: Secondary | ICD-10-CM | POA: Diagnosis not present

## 2018-05-09 DIAGNOSIS — E663 Overweight: Secondary | ICD-10-CM | POA: Diagnosis present

## 2018-05-09 DIAGNOSIS — I10 Essential (primary) hypertension: Secondary | ICD-10-CM | POA: Diagnosis not present

## 2018-05-09 DIAGNOSIS — R5381 Other malaise: Secondary | ICD-10-CM | POA: Diagnosis not present

## 2018-05-09 DIAGNOSIS — J45909 Unspecified asthma, uncomplicated: Secondary | ICD-10-CM | POA: Diagnosis present

## 2018-05-09 DIAGNOSIS — I69319 Unspecified symptoms and signs involving cognitive functions following cerebral infarction: Secondary | ICD-10-CM | POA: Diagnosis not present

## 2018-05-09 DIAGNOSIS — M546 Pain in thoracic spine: Secondary | ICD-10-CM | POA: Diagnosis present

## 2018-05-09 DIAGNOSIS — I639 Cerebral infarction, unspecified: Secondary | ICD-10-CM | POA: Diagnosis not present

## 2018-05-09 DIAGNOSIS — R482 Apraxia: Secondary | ICD-10-CM | POA: Diagnosis not present

## 2018-05-09 DIAGNOSIS — I82451 Acute embolism and thrombosis of right peroneal vein: Secondary | ICD-10-CM | POA: Diagnosis not present

## 2018-05-09 DIAGNOSIS — R739 Hyperglycemia, unspecified: Secondary | ICD-10-CM | POA: Diagnosis not present

## 2018-05-09 DIAGNOSIS — R197 Diarrhea, unspecified: Secondary | ICD-10-CM | POA: Diagnosis not present

## 2018-05-09 DIAGNOSIS — R402362 Coma scale, best motor response, obeys commands, at arrival to emergency department: Secondary | ICD-10-CM | POA: Diagnosis present

## 2018-05-09 DIAGNOSIS — E876 Hypokalemia: Secondary | ICD-10-CM | POA: Diagnosis not present

## 2018-05-09 DIAGNOSIS — I97821 Postprocedural cerebrovascular infarction during other surgery: Secondary | ICD-10-CM | POA: Diagnosis not present

## 2018-05-09 DIAGNOSIS — D62 Acute posthemorrhagic anemia: Secondary | ICD-10-CM | POA: Diagnosis not present

## 2018-05-09 DIAGNOSIS — Z882 Allergy status to sulfonamides status: Secondary | ICD-10-CM

## 2018-05-09 DIAGNOSIS — J189 Pneumonia, unspecified organism: Secondary | ICD-10-CM | POA: Diagnosis not present

## 2018-05-09 DIAGNOSIS — L309 Dermatitis, unspecified: Secondary | ICD-10-CM | POA: Diagnosis present

## 2018-05-09 DIAGNOSIS — G9340 Encephalopathy, unspecified: Secondary | ICD-10-CM | POA: Diagnosis not present

## 2018-05-09 DIAGNOSIS — Z79899 Other long term (current) drug therapy: Secondary | ICD-10-CM

## 2018-05-09 DIAGNOSIS — M25561 Pain in right knee: Secondary | ICD-10-CM | POA: Diagnosis not present

## 2018-05-09 DIAGNOSIS — I602 Nontraumatic subarachnoid hemorrhage from anterior communicating artery: Principal | ICD-10-CM | POA: Diagnosis present

## 2018-05-09 DIAGNOSIS — I82401 Acute embolism and thrombosis of unspecified deep veins of right lower extremity: Secondary | ICD-10-CM | POA: Diagnosis not present

## 2018-05-09 DIAGNOSIS — Z8679 Personal history of other diseases of the circulatory system: Secondary | ICD-10-CM

## 2018-05-09 DIAGNOSIS — R519 Headache, unspecified: Secondary | ICD-10-CM

## 2018-05-09 DIAGNOSIS — M1711 Unilateral primary osteoarthritis, right knee: Secondary | ICD-10-CM | POA: Diagnosis not present

## 2018-05-09 DIAGNOSIS — I609 Nontraumatic subarachnoid hemorrhage, unspecified: Secondary | ICD-10-CM | POA: Diagnosis not present

## 2018-05-09 DIAGNOSIS — J9601 Acute respiratory failure with hypoxia: Secondary | ICD-10-CM | POA: Diagnosis not present

## 2018-05-09 DIAGNOSIS — D72819 Decreased white blood cell count, unspecified: Secondary | ICD-10-CM | POA: Diagnosis not present

## 2018-05-09 DIAGNOSIS — I82409 Acute embolism and thrombosis of unspecified deep veins of unspecified lower extremity: Secondary | ICD-10-CM | POA: Diagnosis not present

## 2018-05-09 DIAGNOSIS — S83271S Complex tear of lateral meniscus, current injury, right knee, sequela: Secondary | ICD-10-CM | POA: Diagnosis not present

## 2018-05-09 DIAGNOSIS — R609 Edema, unspecified: Secondary | ICD-10-CM | POA: Diagnosis not present

## 2018-05-09 DIAGNOSIS — S83271A Complex tear of lateral meniscus, current injury, right knee, initial encounter: Secondary | ICD-10-CM | POA: Diagnosis not present

## 2018-05-09 DIAGNOSIS — S83271D Complex tear of lateral meniscus, current injury, right knee, subsequent encounter: Secondary | ICD-10-CM | POA: Diagnosis not present

## 2018-05-09 DIAGNOSIS — Y92239 Unspecified place in hospital as the place of occurrence of the external cause: Secondary | ICD-10-CM | POA: Diagnosis not present

## 2018-05-09 DIAGNOSIS — F17210 Nicotine dependence, cigarettes, uncomplicated: Secondary | ICD-10-CM | POA: Diagnosis present

## 2018-05-09 DIAGNOSIS — I69051 Hemiplegia and hemiparesis following nontraumatic subarachnoid hemorrhage affecting right dominant side: Secondary | ICD-10-CM | POA: Diagnosis not present

## 2018-05-09 DIAGNOSIS — F4320 Adjustment disorder, unspecified: Secondary | ICD-10-CM | POA: Diagnosis not present

## 2018-05-09 DIAGNOSIS — R509 Fever, unspecified: Secondary | ICD-10-CM | POA: Diagnosis not present

## 2018-05-09 DIAGNOSIS — I63529 Cerebral infarction due to unspecified occlusion or stenosis of unspecified anterior cerebral artery: Secondary | ICD-10-CM | POA: Diagnosis present

## 2018-05-09 DIAGNOSIS — K5901 Slow transit constipation: Secondary | ICD-10-CM | POA: Diagnosis not present

## 2018-05-09 DIAGNOSIS — I63522 Cerebral infarction due to unspecified occlusion or stenosis of left anterior cerebral artery: Secondary | ICD-10-CM | POA: Diagnosis not present

## 2018-05-09 DIAGNOSIS — Z91013 Allergy to seafood: Secondary | ICD-10-CM

## 2018-05-09 DIAGNOSIS — R112 Nausea with vomiting, unspecified: Secondary | ICD-10-CM

## 2018-05-09 DIAGNOSIS — Z6829 Body mass index (BMI) 29.0-29.9, adult: Secondary | ICD-10-CM

## 2018-05-09 DIAGNOSIS — R402252 Coma scale, best verbal response, oriented, at arrival to emergency department: Secondary | ICD-10-CM | POA: Diagnosis present

## 2018-05-09 DIAGNOSIS — I69351 Hemiplegia and hemiparesis following cerebral infarction affecting right dominant side: Secondary | ICD-10-CM | POA: Diagnosis not present

## 2018-05-09 LAB — CBC WITH DIFFERENTIAL/PLATELET
ABS IMMATURE GRANULOCYTES: 0.02 10*3/uL (ref 0.00–0.07)
BASOS ABS: 0 10*3/uL (ref 0.0–0.1)
Basophils Relative: 0 %
Eosinophils Absolute: 0 10*3/uL (ref 0.0–0.5)
Eosinophils Relative: 0 %
HEMATOCRIT: 41.5 % (ref 36.0–46.0)
Hemoglobin: 13.5 g/dL (ref 12.0–15.0)
Immature Granulocytes: 0 %
LYMPHS ABS: 2 10*3/uL (ref 0.7–4.0)
LYMPHS PCT: 36 %
MCH: 31.5 pg (ref 26.0–34.0)
MCHC: 32.5 g/dL (ref 30.0–36.0)
MCV: 96.7 fL (ref 80.0–100.0)
MONOS PCT: 8 %
Monocytes Absolute: 0.4 10*3/uL (ref 0.1–1.0)
NEUTROS ABS: 3.1 10*3/uL (ref 1.7–7.7)
NEUTROS PCT: 56 %
Platelets: 201 10*3/uL (ref 150–400)
RBC: 4.29 MIL/uL (ref 3.87–5.11)
RDW: 13.1 % (ref 11.5–15.5)
WBC: 5.5 10*3/uL (ref 4.0–10.5)
nRBC: 0 % (ref 0.0–0.2)

## 2018-05-09 LAB — I-STAT BETA HCG BLOOD, ED (MC, WL, AP ONLY): I-stat hCG, quantitative: <5 m[IU]/mL (ref <5)

## 2018-05-09 LAB — BASIC METABOLIC PANEL
ANION GAP: 10 (ref 5–15)
BUN: 6 mg/dL (ref 6–20)
CO2: 24 mmol/L (ref 22–32)
Calcium: 9.4 mg/dL (ref 8.9–10.3)
Chloride: 104 mmol/L (ref 98–111)
Creatinine, Ser: 0.61 mg/dL (ref 0.44–1.00)
GLUCOSE: 106 mg/dL — AB (ref 70–99)
POTASSIUM: 3.4 mmol/L — AB (ref 3.5–5.1)
Sodium: 138 mmol/L (ref 135–145)

## 2018-05-09 LAB — HEPATIC FUNCTION PANEL
ALBUMIN: 3.5 g/dL (ref 3.5–5.0)
ALT: 24 U/L (ref 0–44)
AST: 22 U/L (ref 15–41)
Alkaline Phosphatase: 52 U/L (ref 38–126)
BILIRUBIN INDIRECT: 0.6 mg/dL (ref 0.3–0.9)
Bilirubin, Direct: 0.2 mg/dL (ref 0.0–0.2)
TOTAL PROTEIN: 6.7 g/dL (ref 6.5–8.1)
Total Bilirubin: 0.8 mg/dL (ref 0.3–1.2)

## 2018-05-09 LAB — PROTIME-INR
INR: 1
PROTHROMBIN TIME: 13.1 s (ref 11.4–15.2)

## 2018-05-09 MED ORDER — PANTOPRAZOLE SODIUM 40 MG PO TBEC: 40.0000 mg | DELAYED_RELEASE_TABLET | Freq: Every day | ORAL | Status: DC

## 2018-05-09 MED ORDER — STROKE: EARLY STAGES OF RECOVERY BOOK
Freq: Once | Status: AC
  Administered 2018-05-09: 22:00:00
  Filled 2018-05-09: qty 1

## 2018-05-09 MED ORDER — LORATADINE 10 MG PO TABS
10.0000 mg | ORAL_TABLET | Freq: Every day | ORAL | Status: DC
  Administered 2018-05-10 – 2018-05-11 (×2): 10 mg via ORAL
  Filled 2018-05-09 (×2): qty 1

## 2018-05-09 MED ORDER — AMLODIPINE BESYLATE 10 MG PO TABS
10.0000 mg | ORAL_TABLET | Freq: Every day | ORAL | Status: DC
  Administered 2018-05-09 – 2018-05-12 (×4): 10 mg via ORAL
  Filled 2018-05-09 (×4): qty 1

## 2018-05-09 MED ORDER — ACETAMINOPHEN 650 MG RE SUPP: 650.0000 mg | RECTAL | Status: DC | PRN

## 2018-05-09 MED ORDER — ACETAMINOPHEN-CODEINE #3 300-30 MG PO TABS
1.0000 | ORAL_TABLET | ORAL | Status: DC | PRN
  Administered 2018-05-10: 2 via ORAL
  Filled 2018-05-09: qty 2

## 2018-05-09 MED ORDER — CLEVIDIPINE BUTYRATE 0.5 MG/ML IV EMUL
0.0000 mg/h | INTRAVENOUS | Status: DC
  Administered 2018-05-09: 21 mg/h via INTRAVENOUS
  Administered 2018-05-09: 1 mg/h via INTRAVENOUS
  Administered 2018-05-10 (×2): 18 mg/h via INTRAVENOUS
  Administered 2018-05-10: 1 mg/h via INTRAVENOUS
  Administered 2018-05-10 (×3): 18 mg/h via INTRAVENOUS
  Administered 2018-05-11: 9 mg/h via INTRAVENOUS
  Administered 2018-05-11: 19 mg/h via INTRAVENOUS
  Administered 2018-05-11: 15 mg/h via INTRAVENOUS
  Administered 2018-05-11: 21 mg/h via INTRAVENOUS
  Administered 2018-05-12: 1 mg/h via INTRAVENOUS
  Administered 2018-05-12: 3 mg/h via INTRAVENOUS
  Filled 2018-05-09: qty 100
  Filled 2018-05-09 (×9): qty 50
  Filled 2018-05-09: qty 100
  Filled 2018-05-09 (×3): qty 50

## 2018-05-09 MED ORDER — LABETALOL HCL 5 MG/ML IV SOLN
20.0000 mg | Freq: Once | INTRAVENOUS | Status: AC
  Administered 2018-05-09: 20 mg via INTRAVENOUS
  Filled 2018-05-09: qty 4

## 2018-05-09 MED ORDER — PANTOPRAZOLE SODIUM 40 MG IV SOLR
40.0000 mg | Freq: Every day | INTRAVENOUS | Status: DC
  Administered 2018-05-09 – 2018-05-12 (×3): 40 mg via INTRAVENOUS
  Filled 2018-05-09 (×4): qty 40

## 2018-05-09 MED ORDER — MORPHINE SULFATE (PF) 2 MG/ML IV SOLN
1.0000 mg | INTRAVENOUS | Status: DC | PRN
  Administered 2018-05-09 (×2): 2 mg via INTRAVENOUS
  Administered 2018-05-10 (×5): 4 mg via INTRAVENOUS
  Filled 2018-05-09: qty 1
  Filled 2018-05-09 (×3): qty 2
  Filled 2018-05-09 (×3): qty 1
  Filled 2018-05-09: qty 2

## 2018-05-09 MED ORDER — SENNOSIDES-DOCUSATE SODIUM 8.6-50 MG PO TABS
1.0000 | ORAL_TABLET | Freq: Two times a day (BID) | ORAL | Status: DC
  Administered 2018-05-09 – 2018-05-15 (×8): 1 via ORAL
  Filled 2018-05-09 (×9): qty 1

## 2018-05-09 MED ORDER — IBUPROFEN 200 MG PO TABS: 600.0000 mg | ORAL_TABLET | Freq: Four times a day (QID) | ORAL | Status: DC

## 2018-05-09 MED ORDER — SODIUM CHLORIDE 0.9 % IV SOLN
INTRAVENOUS | Status: DC
  Administered 2018-05-09 – 2018-05-21 (×17): via INTRAVENOUS

## 2018-05-09 MED ORDER — HYDRALAZINE HCL 20 MG/ML IJ SOLN
5.0000 mg | INTRAMUSCULAR | Status: DC | PRN
  Administered 2018-05-09 – 2018-05-15 (×4): 20 mg via INTRAVENOUS
  Filled 2018-05-09 (×3): qty 1

## 2018-05-09 MED ORDER — NIMODIPINE 60 MG/20ML PO SOLN
60.0000 mg | ORAL | Status: DC
  Administered 2018-05-09 – 2018-05-21 (×65): 60 mg
  Filled 2018-05-09 (×67): qty 20

## 2018-05-09 MED ORDER — CLOBETASOL PROPIONATE 0.05 % EX OINT: 1.0000 "application " | TOPICAL_OINTMENT | Freq: Two times a day (BID) | CUTANEOUS | Status: DC

## 2018-05-09 MED ORDER — ACETAMINOPHEN 500 MG PO TABS
1000.0000 mg | ORAL_TABLET | Freq: Once | ORAL | Status: AC
  Administered 2018-05-09: 1000 mg via ORAL
  Filled 2018-05-09: qty 2

## 2018-05-09 MED ORDER — NIMODIPINE 30 MG PO CAPS
60.0000 mg | ORAL_CAPSULE | ORAL | Status: DC
  Administered 2018-05-10 – 2018-05-20 (×2): 60 mg via ORAL
  Filled 2018-05-09 (×5): qty 2

## 2018-05-09 MED ORDER — ACETAMINOPHEN 325 MG PO TABS: 650.0000 mg | ORAL_TABLET | ORAL | Status: DC | PRN

## 2018-05-09 MED ORDER — IOPAMIDOL (ISOVUE-370) INJECTION 76%
75.0000 mL | Freq: Once | INTRAVENOUS | Status: AC | PRN
  Administered 2018-05-09: 100 mL via INTRAVENOUS

## 2018-05-09 MED ORDER — ONDANSETRON HCL 4 MG/2ML IJ SOLN
4.0000 mg | Freq: Four times a day (QID) | INTRAMUSCULAR | Status: DC | PRN
  Administered 2018-05-09 – 2018-05-15 (×3): 4 mg via INTRAVENOUS
  Filled 2018-05-09 (×3): qty 2

## 2018-05-09 MED ORDER — TRIAMCINOLONE ACETONIDE 0.1 % EX CREA: 1.0000 "application " | TOPICAL_CREAM | Freq: Two times a day (BID) | CUTANEOUS | Status: DC | PRN

## 2018-05-09 MED ORDER — HYDRALAZINE HCL 20 MG/ML IJ SOLN
INTRAMUSCULAR | Status: AC
  Filled 2018-05-09: qty 1

## 2018-05-09 MED ORDER — SODIUM CHLORIDE 0.9 % IV BOLUS
1000.0000 mL | Freq: Once | INTRAVENOUS | Status: AC
  Administered 2018-05-09: 1000 mL via INTRAVENOUS

## 2018-05-09 MED ORDER — IOPAMIDOL (ISOVUE-370) INJECTION 76%
INTRAVENOUS | Status: AC
  Filled 2018-05-09: qty 100

## 2018-05-09 MED ORDER — ACETAMINOPHEN 160 MG/5ML PO SOLN
650.0000 mg | ORAL | Status: DC | PRN
  Administered 2018-05-11 – 2018-05-19 (×23): 650 mg
  Filled 2018-05-09 (×23): qty 20.3

## 2018-05-09 MED ORDER — METOCLOPRAMIDE HCL 5 MG/ML IJ SOLN
10.0000 mg | Freq: Once | INTRAMUSCULAR | Status: AC
  Administered 2018-05-09: 10 mg via INTRAVENOUS
  Filled 2018-05-09: qty 2

## 2018-05-09 MED ORDER — MONTELUKAST SODIUM 10 MG PO TABS
10.0000 mg | ORAL_TABLET | Freq: Every day | ORAL | Status: DC
Start: 2018-05-09 — End: 2018-05-11
  Administered 2018-05-09: 10 mg via ORAL
  Filled 2018-05-09 (×2): qty 1

## 2018-05-09 NOTE — H&P (Signed)
Reason for Consult:SAH Referring Physician: Nehemiah [name] [facility] is a 32 y.o. female.  HPI:   Per Dr. Donney Rankins:  [name] [name] is a 32 y.o. female with history of HTN on amlodipine, allergic rhinitis, chronic nasal congestion, eczema is here for evaluation of headache.  She was sent to the ER for further evaluation from urgent care.  Patient reports 3 days ago she had a sudden onset severe frontal headache when she was getting out of the shower.  States it feels like somebody punched her right in the face.  Initially intermittent but constant the last 2 days.  Her headache is located to her forehead and to the base of her neck.  Associated with bilateral neck pain and tightness that radiates down to her entire back onto the top of her buttocks.  Also reports nausea and vomiting up to 2 times a day for the last 3 days.  Her head and neck pain is worse with movement, when she sits up, lays on her sides or is exerting herself.  She has tried Tylenol which gives her relief for about 15 minutes.  She feels better in fetal position.  She denies any preceding trauma or neck injury.  States 3 weeks ago she got new braids put in her hair that she states are heavy.  No anticoagulants.  Notes she has had nasal congestion for a long time and approximately 2 to 3 weeks, she has been taking Singulair and saline nasal rinses without significant relief.  Denies associated fevers, chills, dizziness, diplopia, dysarthria, dysphagia, difficulty with walking, tinnitus, paresthesias, loss of sensation or weakness unilaterally.  She gave birth to her son 17 months ago, is not on any estrogen therapy.  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    Past Medical History:  Diagnosis Date  . Asthma, allergic    uses inhaler prn - infrequently  . Bilateral thoracic back pain 09/13/2015  . Eczema   . Hypertension   . Lactose intolerance   . Mature cystic teratoma of  ovary   . Mood swings 06/01/2015  . Seasonal allergies     Past Surgical History:  Procedure Laterality Date  . TUBAL LIGATION N/A 12/15/2016   Procedure: POST PARTUM TUBAL LIGATION;  Surgeon: Mora Bellman, MD;  Location: [name];  Service: Gynecology;  Laterality: N/A;  . WISDOM TOOTH EXTRACTION      Family History  Adopted: Yes  Problem Relation Age of Onset  . Hypertension Father   . Hypertension Maternal Aunt   . Hypertension Maternal Uncle   . Hypertension Maternal Grandmother   . Eczema Maternal Grandmother   . Hypertension Maternal Grandfather   . Hypertension Paternal Grandmother   . Hypertension Mother   . Eczema Mother     Social History:  reports that she has been smoking cigarettes. She has been smoking about 0.25 packs per day. She has quit using smokeless tobacco. She reports that she does not drink alcohol or use drugs.  Allergies:  Allergies  Allergen Reactions  . Sulfa Antibiotics Swelling    Causes swelling on the face.  . Shellfish Allergy Other (See Comments)    Stomach upset    Medications: I have reviewed the patient's current medications.  Results for orders placed or performed during the hospital encounter of 05/09/18 (from the past 48 hour(s))  CBC with Differential     Status: None   Collection Time: 05/09/18  3:38 PM  Result Value Ref Range   WBC 5.5 4.0 - 10.5 K/uL   RBC 4.29 3.87 - 5.11 MIL/uL   Hemoglobin 13.5 12.0 - 15.0 g/dL   HCT 41.5 36.0 - 46.0 %   MCV 96.7 80.0 - 100.0 fL   MCH 31.5 26.0 - 34.0 pg   MCHC 32.5 30.0 - 36.0 g/dL   RDW 13.1 11.5 - 15.5 %   Platelets 201 150 - 400 K/uL   nRBC 0.0 0.0 - 0.2 %   Neutrophils Relative % 56 %   Neutro Abs 3.1 1.7 - 7.7 K/uL   Lymphocytes Relative 36 %   Lymphs Abs 2.0 0.7 - 4.0 K/uL   Monocytes Relative 8 %   Monocytes Absolute 0.4 0.1 - 1.0 K/uL   Eosinophils Relative 0 %   Eosinophils Absolute 0.0 0.0 - 0.5 K/uL   Basophils Relative 0 %   Basophils Absolute 0.0 0.0 - 0.1 K/uL    Immature Granulocytes 0 %   Abs Immature Granulocytes 0.02 0.00 - 0.07 K/uL    Comment: Performed at [facility], [address], [place], [place] [address]  Basic metabolic panel     Status: Abnormal   Collection Time: 05/09/18  3:38 PM  Result Value Ref Range   Sodium 138 135 - 145 mmol/L   Potassium 3.4 (L) 3.5 - 5.1 mmol/L   Chloride 104 98 - 111 mmol/L   CO2 24 22 - 32 mmol/L   Glucose, Bld 106 (H) 70 - 99 mg/dL   BUN 6 6 - 20 mg/dL   Creatinine, Ser 0.61 0.44 - 1.00 mg/dL   Calcium 9.4 8.9 - 10.3 mg/dL   GFR calc non Af Amer >60 >60 mL/min   GFR calc Af Amer >60 >60 mL/min    Comment: (NOTE) The eGFR has been calculated using the CKD EPI equation. This calculation has not been validated in all clinical situations. eGFR's persistently <60 mL/min signify possible Chronic Kidney Disease.    Anion gap 10 5 - 15    Comment: Performed at [facility] [address] [place], [place] [address]  I-Stat Beta hCG blood, ED ([facility], [facility], [facility] only)     Status: None   Collection Time: 05/09/18  6:30 PM  Result Value Ref Range   I-stat hCG, quantitative <5.0 <5 mIU/mL   Comment 3            Comment:   GEST. AGE      CONC.  (mIU/mL)   <=1 WEEK        5 - 50     2 WEEKS       50 - 500     3 WEEKS       100 - 10,000     4 WEEKS     1,000 - 30,000        FEMALE AND NON-PREGNANT FEMALE:     LESS THAN 5 mIU/mL     Ct Angio Head W Or Wo Contrast  Result Date: 05/09/2018 CLINICAL DATA:  Headache for 3 days radiating to neck and back. Vomiting. EXAM: CT ANGIOGRAPHY HEAD AND NECK TECHNIQUE: Multidetector CT imaging of the head and neck was performed using the standard protocol during bolus administration of intravenous contrast. Multiplanar CT image reconstructions and MIPs were obtained to evaluate the vascular anatomy. Carotid stenosis measurements (when applicable) are obtained utilizing NASCET criteria, using the distal internal carotid diameter as the denominator. CONTRAST:   170m ISOVUE-370 IOPAMIDOL (ISOVUE-370) INJECTION  76% COMPARISON:  None. FINDINGS: CT HEAD FINDINGS BRAIN: Small volume subarachnoid hemorrhage with dense fourth ventricle blood products with small amount of additional intraventricular hemorrhage. No intraparenchymal hemorrhage, mass effect nor midline shift. The ventricles and sulci are upper limits of normal for age. No acute large vascular territory infarcts. No abnormal extra-axial fluid collections. Basal cisterns are patent. VASCULAR: Unremarkable. SKULL/SOFT TISSUES: No skull fracture. No significant soft tissue swelling. ORBITS/SINUSES: The included ocular globes and orbital contents are normal.Mild-to-moderate paranasal sinus mucosal thickening. Mastoid air cells are well aerated. OTHER: None. CTA NECK FINDINGS: AORTIC ARCH: Normal appearance of the thoracic arch, 2 vessel arch is a normal variant. The origins of the innominate, left Common carotid artery and subclavian artery are widely patent. RIGHT CAROTID SYSTEM: Common carotid artery is patent. Normal appearance of the carotid bifurcation without hemodynamically significant stenosis by NASCET criteria. Normal appearance of the internal carotid artery. LEFT CAROTID SYSTEM: Common carotid artery is patent. Normal appearance of the carotid bifurcation without hemodynamically significant stenosis by NASCET criteria. Minimal luminal irregularity distal LEFT cervical ICA seen with fibromuscular dysplasia or, artifact. VERTEBRAL ARTERIES:Left vertebral artery is dominant. Normal appearance of the vertebral arteries, widely patent. SKELETON: No acute osseous process though bone windows have not been submitted. Bilateral molar periapical abscesses. OTHER NECK: Soft tissues of the neck are nonacute though, not tailored for evaluation. UPPER CHEST: Included lung apices are clear. No superior mediastinal lymphadenopathy. CTA HEAD FINDINGS: ANTERIOR CIRCULATION: Patent cervical internal carotid arteries, petrous,  cavernous and supra clinoid internal carotid arteries. Patent anterior communicating artery. 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 Patent anterior and middle cerebral arteries, mild luminal irregularity. No large vessel occlusion, significant stenosis, contrast extravasation. POSTERIOR CIRCULATION: Patent vertebral arteries, vertebrobasilar junction and basilar artery, as well as main branch vessels. Mildly bulbous basilar tip. Patent posterior cerebral arteries, mild luminal irregularity. Small RIGHT posterior communicating artery present. No large vessel occlusion, significant stenosis, contrast extravasation or aneurysm. VENOUS SINUSES: Major dural venous sinuses are patent though not tailored for evaluation on this angiographic examination. ANATOMIC VARIANTS: Partially standing straight of basilar artery. DELAYED PHASE: No abnormal intracranial enhancement. MIP images reviewed. IMPRESSION: CT HEAD: 1. Small volume subarachnoid hemorrhage, likely redistributed into fourth ventricle with early suspected hydrocephalus. CTA NECK: 1. No acute vascular process. 2. No hemodynamically significant stenosis ICA's. Mild LEFT cervical ICA FMD versus artifact. 3. Patent vertebral arteries. CTA HEAD: 1. 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 Neuro-Interventional Radiology consultation is suggested to evaluate the appropriateness of potential treatment. Emergency evaluation can be requested by paging [phone]. 2. Mild luminal irregularity of cerebral arteries concerning for vasospasm. Critical Value/emergent results were called by telephone at the time of interpretation on 05/09/2018 at 7:05 pm to [facility] , who verbally acknowledged these results. Electronically Signed   By: Elon Alas M.D.   On: 05/09/2018 19:17   Ct Angio Neck W And/or Wo Contrast  Result Date: 05/09/2018 CLINICAL DATA:  Headache for 3 days radiating to neck and back. Vomiting.  EXAM: CT ANGIOGRAPHY HEAD AND NECK TECHNIQUE: Multidetector CT imaging of the head and neck was performed using the standard protocol during bolus administration of intravenous contrast. Multiplanar CT image reconstructions and MIPs were obtained to evaluate the vascular anatomy. Carotid stenosis measurements (when applicable) are obtained utilizing NASCET criteria, using the distal internal carotid diameter as the denominator. CONTRAST:  138m ISOVUE-370 IOPAMIDOL (ISOVUE-370) INJECTION 76% COMPARISON:  None. FINDINGS: CT  HEAD FINDINGS BRAIN: Small volume subarachnoid hemorrhage with dense fourth ventricle blood products with small amount of additional intraventricular hemorrhage. No intraparenchymal hemorrhage, mass effect nor midline shift. The ventricles and sulci are upper limits of normal for age. No acute large vascular territory infarcts. No abnormal extra-axial fluid collections. Basal cisterns are patent. VASCULAR: Unremarkable. SKULL/SOFT TISSUES: No skull fracture. No significant soft tissue swelling. ORBITS/SINUSES: The included ocular globes and orbital contents are normal.Mild-to-moderate paranasal sinus mucosal thickening. Mastoid air cells are well aerated. OTHER: None. CTA NECK FINDINGS: AORTIC ARCH: Normal appearance of the thoracic arch, 2 vessel arch is a normal variant. The origins of the innominate, left Common carotid artery and subclavian artery are widely patent. RIGHT CAROTID SYSTEM: Common carotid artery is patent. Normal appearance of the carotid bifurcation without hemodynamically significant stenosis by NASCET criteria. Normal appearance of the internal carotid artery. LEFT CAROTID SYSTEM: Common carotid artery is patent. Normal appearance of the carotid bifurcation without hemodynamically significant stenosis by NASCET criteria. Minimal luminal irregularity distal LEFT cervical ICA seen with fibromuscular dysplasia or, artifact. VERTEBRAL ARTERIES:Left vertebral artery is dominant.  Normal appearance of the vertebral arteries, widely patent. SKELETON: No acute osseous process though bone windows have not been submitted. Bilateral molar periapical abscesses. OTHER NECK: Soft tissues of the neck are nonacute though, not tailored for evaluation. UPPER CHEST: Included lung apices are clear. No superior mediastinal lymphadenopathy. CTA HEAD FINDINGS: ANTERIOR CIRCULATION: Patent cervical internal carotid arteries, petrous, cavernous and supra clinoid internal carotid arteries. Patent anterior communicating artery. 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 Patent anterior and middle cerebral arteries, mild luminal irregularity. No large vessel occlusion, significant stenosis, contrast extravasation. POSTERIOR CIRCULATION: Patent vertebral arteries, vertebrobasilar junction and basilar artery, as well as main branch vessels. Mildly bulbous basilar tip. Patent posterior cerebral arteries, mild luminal irregularity. Small RIGHT posterior communicating artery present. No large vessel occlusion, significant stenosis, contrast extravasation or aneurysm. VENOUS SINUSES: Major dural venous sinuses are patent though not tailored for evaluation on this angiographic examination. ANATOMIC VARIANTS: Partially standing straight of basilar artery. DELAYED PHASE: No abnormal intracranial enhancement. MIP images reviewed. IMPRESSION: CT HEAD: 1. Small volume subarachnoid hemorrhage, likely redistributed into fourth ventricle with early suspected hydrocephalus. CTA NECK: 1. No acute vascular process. 2. No hemodynamically significant stenosis ICA's. Mild LEFT cervical ICA FMD versus artifact. 3. Patent vertebral arteries. CTA HEAD: 1. 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 Neuro-Interventional Radiology consultation is suggested to evaluate the appropriateness of potential treatment. Emergency evaluation can be requested by paging [phone]. 2. Mild luminal  irregularity of cerebral arteries concerning for vasospasm. Critical Value/emergent results were called by telephone at the time of interpretation on 05/09/2018 at 7:05 pm to [facility] , who verbally acknowledged these results. Electronically Signed   By: Elon Alas M.D.   On: 05/09/2018 19:17    Review of Systems - Negative except per HPI    Blood pressure (!) 152/88, pulse (!) 57, temperature 98.1 F (36.7 C), temperature source Oral, resp. rate 12, last menstrual period 05/03/2018, SpO2 100 %, currently breastfeeding. Physical Exam  Constitutional: She is oriented to person, place, and time. She appears well-developed and well-nourished.  Eyes: Pupils are equal, round, and reactive to light. EOM are normal.  Photophobia  Neck: Neck rigidity present.  Cardiovascular: Normal rate, regular rhythm, normal heart sounds and normal pulses.  Respiratory: Effort normal and breath sounds normal.  GI: Normal appearance. There is  no tenderness.  Neurological: She is alert and oriented to person, place, and time. She has normal strength and normal reflexes. No cranial nerve deficit or sensory deficit. GCS eye subscore is 4. GCS verbal subscore is 5. GCS motor subscore is 6.  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  Plan is admission with endovascular treatment of this aneurysm.  Peggyann Shoals, MD 05/09/2018, 7:34 PM

## 2018-05-09 NOTE — ED Provider Notes (Signed)
[facility] EMERGENCY DEPARTMENT Provider Note   CSN: [id number] Arrival date & time: 05/09/18  1517     History   Chief Complaint Chief Complaint  Patient presents with  . Headache  . Emesis    HPI [name] [name] is a 32 y.o. female with history of HTN on amlodipine, allergic rhinitis, chronic nasal congestion, eczema is here for evaluation of headache.  She was sent to the ER for further evaluation from urgent care.  Patient reports 3 days ago she had a sudden onset severe frontal headache when she was getting out of the shower.  States it feels like somebody punched her right in the face.  Initially intermittent but constant the last 2 days.  Her headache is located to her forehead and to the base of her neck.  Associated with bilateral neck pain and tightness that radiates down to her entire back onto the top of her buttocks.  Also reports nausea and vomiting up to 2 times a day for the last 3 days.  Her head and neck pain is worse with movement, when she sits up, lays on her sides or is exerting herself.  She has tried Tylenol which gives her relief for about 15 minutes.  She feels better in fetal position.  She denies any preceding trauma or neck injury.  States 3 weeks ago she got new braids put in her hair that she states are heavy.  No anticoagulants.  Notes she has had nasal congestion for a long time and approximately 2 to 3 weeks, she has been taking Singulair and saline nasal rinses without significant relief.  Denies associated fevers, chills, dizziness, diplopia, dysarthria, dysphagia, difficulty with walking, tinnitus, paresthesias, loss of sensation or weakness unilaterally.  She gave birth to her son 17 months ago, is not on any estrogen therapy.  HPI  Past Medical History:  Diagnosis Date  . Asthma, allergic    uses inhaler prn - infrequently  . Bilateral thoracic back pain 09/13/2015  . Eczema   . Hypertension   . Lactose intolerance   . Mature  cystic teratoma of ovary   . Mood swings 06/01/2015  . Seasonal allergies     Patient Active Problem List   Diagnosis Date Noted  . Subarachnoid hemorrhage ([place]) 05/09/2018  . Gastroesophageal reflux disease without esophagitis 10/09/2016  . BMI 31.0-31.9,adult 07/20/2016  . Obesity in pregnancy, antepartum 07/20/2016  . Tobacco abuse 06/01/2015  . Marijuana use 06/23/2014    Past Surgical History:  Procedure Laterality Date  . TUBAL LIGATION N/A 12/15/2016   Procedure: POST PARTUM TUBAL LIGATION;  Surgeon: Mora Bellman, MD;  Location: [facility];  Service: Gynecology;  Laterality: N/A;  . WISDOM TOOTH EXTRACTION       OB History    Gravida  3   Para  3   Term  3   Preterm  0   AB  0   Living  3     SAB  0   TAB  0   Ectopic  0   Multiple  0   Live Births  3            Home Medications    Prior to Admission medications   Medication Sig Start Date End Date Taking? Authorizing Provider  acetaminophen (TYLENOL) 325 MG tablet Take 650 mg by mouth every 6 (six) hours as needed.    [provider]  amLODipine (NORVASC) 10 MG tablet Take 1 tablet (10 mg total) by  mouth daily. To lower blood pressure 04/12/18   Fulp, Cammie, MD  cetirizine (ZYRTEC) 10 MG tablet TAKE 1 TABLET BY MOUTH ONCE DAILY 03/07/18   Charlott Rakes, MD  clobetasol ointment (TEMOVATE) 9.51 % Apply 1 application topically 2 (two) times daily. 04/12/18   Fulp, Cammie, MD  ibuprofen (ADVIL,MOTRIN) 600 MG tablet Take 1 tablet (600 mg total) by mouth every 6 (six) hours. X 1week then prn pain 10/25/17   Freeman Caldron M, PA-C  montelukast (SINGULAIR) 10 MG tablet TAKE 1 TABLET BY MOUTH ONCE DAILY AT BEDTIME 03/07/18   Charlott Rakes, MD  omeprazole (PRILOSEC) 20 MG capsule TAKE 1 CAPSULE BY MOUTH ONCE DAILY 03/07/18   Charlott Rakes, MD  Prenatal Vit w/Fe-Methylfol-FA (PNV PO) Take by mouth.    [provider]  triamcinolone cream (KENALOG) 0.1 % Apply 1 application topically 2  (two) times daily as needed. To affected areas for eczema. 06/22/16   Donnamae Jude, MD    Family History Family History  Adopted: Yes  Problem Relation Age of Onset  . Hypertension Father   . Hypertension Maternal Aunt   . Hypertension Maternal Uncle   . Hypertension Maternal Grandmother   . Eczema Maternal Grandmother   . Hypertension Maternal Grandfather   . Hypertension Paternal Grandmother   . Hypertension Mother   . Eczema Mother     Social History Social History   Tobacco Use  . Smoking status: Current Every Day Smoker    Packs/day: 0.25    Types: Cigarettes  . Smokeless tobacco: Former Network engineer Use Topics  . Alcohol use: No  . Drug use: No     Allergies   Sulfa antibiotics and Shellfish allergy   Review of Systems Review of Systems  Gastrointestinal: Positive for nausea and vomiting.  Musculoskeletal: Positive for neck pain.  Neurological: Positive for headaches.  All other systems reviewed and are negative.    Physical Exam Updated Vital Signs BP (!) 152/88   Pulse (!) 57   Temp 98.1 F (36.7 C) (Oral)   Resp 12   LMP 05/03/2018   SpO2 100%   Physical Exam  Constitutional: She appears well-developed.  Non-toxic appearance.  NAD.  HENT:  Head: Normocephalic and atraumatic.  Right Ear: External ear normal.  Left Ear: External ear normal.  Nose: Mucosal edema and rhinorrhea present. No septal deviation.  Moist mucous membranes Uvula midline Oropharynx and tonsils normal No tenderness over temporal arteries  Eyes: Lids are normal. Right eye exhibits discharge (clear). Left eye exhibits discharge (clear). Right conjunctiva is injected. Left conjunctiva is injected.  Unable to visualize back of eye  Neck: Muscular tenderness present.  C-spine: Diffuse bilateral muscular tenderness from the base of the head down to trapezius.  No midline tenderness.  Pain reported with neck rotation and bend.  Chin to chest decreased secondary to pain.     Cardiovascular: Normal rate, regular rhythm and normal heart sounds.  Pulses:      Radial pulses are 2+ on the right side, and 2+ on the left side.       Dorsalis pedis pulses are 2+ on the right side, and 2+ on the left side.  Pulmonary/Chest: Effort normal and breath sounds normal.  Lymphadenopathy:  No cervical adenopathy  Neurological: She is alert. GCS eye subscore is 4. GCS verbal subscore is 5. GCS motor subscore is 6.  Speech is fluent without obvious dysarthria or aphasia. Strength 5/5 in upper and lower extremities   Sensation to  light touch intact in bilateral face, upper and lower extremities Normal gait. No pronator drift. No leg drop.  Normal finger-to-nose and finger tapping.  CN II-XII grossly intact bilaterally.  Knee DTR symmetric.   Skin: Skin is warm and dry. Capillary refill takes less than 2 seconds. Rash noted.  Eczematous rash to lower extremities.  Psychiatric: She has a normal mood and affect. Her speech is normal and behavior is normal. Judgment and thought content normal.     ED Treatments / Results  Labs (all labs ordered are listed, but only abnormal results are displayed) Labs Reviewed  BASIC METABOLIC PANEL - Abnormal; Notable for the following components:      Result Value   Potassium 3.4 (*)    Glucose, Bld 106 (*)    All other components within normal limits  CBC WITH DIFFERENTIAL/PLATELET  HEPATIC FUNCTION PANEL  PROTIME-INR  I-STAT BETA HCG BLOOD, ED ([facility], [facility], [facility] ONLY)    EKG None  Radiology Ct Angio Head W Or Wo Contrast  Result Date: 05/09/2018 CLINICAL DATA:  Headache for 3 days radiating to neck and back. Vomiting. EXAM: CT ANGIOGRAPHY HEAD AND NECK TECHNIQUE: Multidetector CT imaging of the head and neck was performed using the standard protocol during bolus administration of intravenous contrast. Multiplanar CT image reconstructions and MIPs were obtained to evaluate the vascular anatomy. Carotid stenosis measurements (when  applicable) are obtained utilizing NASCET criteria, using the distal internal carotid diameter as the denominator. CONTRAST:  16mL ISOVUE-370 IOPAMIDOL (ISOVUE-370) INJECTION 76% COMPARISON:  None. FINDINGS: CT HEAD FINDINGS BRAIN: Small volume subarachnoid hemorrhage with dense fourth ventricle blood products with small amount of additional intraventricular hemorrhage. No intraparenchymal hemorrhage, mass effect nor midline shift. The ventricles and sulci are upper limits of normal for age. No acute large vascular territory infarcts. No abnormal extra-axial fluid collections. Basal cisterns are patent. VASCULAR: Unremarkable. SKULL/SOFT TISSUES: No skull fracture. No significant soft tissue swelling. ORBITS/SINUSES: The included ocular globes and orbital contents are normal.Mild-to-moderate paranasal sinus mucosal thickening. Mastoid air cells are well aerated. OTHER: None. CTA NECK FINDINGS: AORTIC ARCH: Normal appearance of the thoracic arch, 2 vessel arch is a normal variant. The origins of the innominate, left Common carotid artery and subclavian artery are widely patent. RIGHT CAROTID SYSTEM: Common carotid artery is patent. Normal appearance of the carotid bifurcation without hemodynamically significant stenosis by NASCET criteria. Normal appearance of the internal carotid artery. LEFT CAROTID SYSTEM: Common carotid artery is patent. Normal appearance of the carotid bifurcation without hemodynamically significant stenosis by NASCET criteria. Minimal luminal irregularity distal LEFT cervical ICA seen with fibromuscular dysplasia or, artifact. VERTEBRAL ARTERIES:Left vertebral artery is dominant. Normal appearance of the vertebral arteries, widely patent. SKELETON: No acute osseous process though bone windows have not been submitted. Bilateral molar periapical abscesses. OTHER NECK: Soft tissues of the neck are nonacute though, not tailored for evaluation. UPPER CHEST: Included lung apices are clear. No  superior mediastinal lymphadenopathy. CTA HEAD FINDINGS: ANTERIOR CIRCULATION: Patent cervical internal carotid arteries, petrous, cavernous and supra clinoid internal carotid arteries. Patent anterior communicating artery. 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 Patent anterior and middle cerebral arteries, mild luminal irregularity. No large vessel occlusion, significant stenosis, contrast extravasation. POSTERIOR CIRCULATION: Patent vertebral arteries, vertebrobasilar junction and basilar artery, as well as main branch vessels. Mildly bulbous basilar tip. Patent posterior cerebral arteries, mild luminal irregularity. Small RIGHT posterior communicating artery present. No large vessel occlusion, significant  stenosis, contrast extravasation or aneurysm. VENOUS SINUSES: Major dural venous sinuses are patent though not tailored for evaluation on this angiographic examination. ANATOMIC VARIANTS: Partially standing straight of basilar artery. DELAYED PHASE: No abnormal intracranial enhancement. MIP images reviewed. IMPRESSION: CT HEAD: 1. Small volume subarachnoid hemorrhage, likely redistributed into fourth ventricle with early suspected hydrocephalus. CTA NECK: 1. No acute vascular process. 2. No hemodynamically significant stenosis ICA's. Mild LEFT cervical ICA FMD versus artifact. 3. Patent vertebral arteries. CTA HEAD: 1. 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 Neuro-Interventional Radiology consultation is suggested to evaluate the appropriateness of potential treatment. Emergency evaluation can be requested by paging [phone]. 2. Mild luminal irregularity of cerebral arteries concerning for vasospasm. Critical Value/emergent results were called by telephone at the time of interpretation on 05/09/2018 at 7:05 pm to [facility] , who verbally acknowledged these results. Electronically Signed   By: Elon Alas M.D.   On: 05/09/2018 19:17   Ct  Angio Neck W And/or Wo Contrast  Result Date: 05/09/2018 CLINICAL DATA:  Headache for 3 days radiating to neck and back. Vomiting. EXAM: CT ANGIOGRAPHY HEAD AND NECK TECHNIQUE: Multidetector CT imaging of the head and neck was performed using the standard protocol during bolus administration of intravenous contrast. Multiplanar CT image reconstructions and MIPs were obtained to evaluate the vascular anatomy. Carotid stenosis measurements (when applicable) are obtained utilizing NASCET criteria, using the distal internal carotid diameter as the denominator. CONTRAST:  164mL ISOVUE-370 IOPAMIDOL (ISOVUE-370) INJECTION 76% COMPARISON:  None. FINDINGS: CT HEAD FINDINGS BRAIN: Small volume subarachnoid hemorrhage with dense fourth ventricle blood products with small amount of additional intraventricular hemorrhage. No intraparenchymal hemorrhage, mass effect nor midline shift. The ventricles and sulci are upper limits of normal for age. No acute large vascular territory infarcts. No abnormal extra-axial fluid collections. Basal cisterns are patent. VASCULAR: Unremarkable. SKULL/SOFT TISSUES: No skull fracture. No significant soft tissue swelling. ORBITS/SINUSES: The included ocular globes and orbital contents are normal.Mild-to-moderate paranasal sinus mucosal thickening. Mastoid air cells are well aerated. OTHER: None. CTA NECK FINDINGS: AORTIC ARCH: Normal appearance of the thoracic arch, 2 vessel arch is a normal variant. The origins of the innominate, left Common carotid artery and subclavian artery are widely patent. RIGHT CAROTID SYSTEM: Common carotid artery is patent. Normal appearance of the carotid bifurcation without hemodynamically significant stenosis by NASCET criteria. Normal appearance of the internal carotid artery. LEFT CAROTID SYSTEM: Common carotid artery is patent. Normal appearance of the carotid bifurcation without hemodynamically significant stenosis by NASCET criteria. Minimal luminal  irregularity distal LEFT cervical ICA seen with fibromuscular dysplasia or, artifact. VERTEBRAL ARTERIES:Left vertebral artery is dominant. Normal appearance of the vertebral arteries, widely patent. SKELETON: No acute osseous process though bone windows have not been submitted. Bilateral molar periapical abscesses. OTHER NECK: Soft tissues of the neck are nonacute though, not tailored for evaluation. UPPER CHEST: Included lung apices are clear. No superior mediastinal lymphadenopathy. CTA HEAD FINDINGS: ANTERIOR CIRCULATION: Patent cervical internal carotid arteries, petrous, cavernous and supra clinoid internal carotid arteries. Patent anterior communicating artery. 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 Patent anterior and middle cerebral arteries, mild luminal irregularity. No large vessel occlusion, significant stenosis, contrast extravasation. POSTERIOR CIRCULATION: Patent vertebral arteries, vertebrobasilar junction and basilar artery, as well as main branch vessels. Mildly bulbous basilar tip. Patent posterior cerebral arteries, mild luminal irregularity. Small RIGHT posterior communicating artery present. No large vessel occlusion, significant stenosis, contrast extravasation or aneurysm. VENOUS  SINUSES: Major dural venous sinuses are patent though not tailored for evaluation on this angiographic examination. ANATOMIC VARIANTS: Partially standing straight of basilar artery. DELAYED PHASE: No abnormal intracranial enhancement. MIP images reviewed. IMPRESSION: CT HEAD: 1. Small volume subarachnoid hemorrhage, likely redistributed into fourth ventricle with early suspected hydrocephalus. CTA NECK: 1. No acute vascular process. 2. No hemodynamically significant stenosis ICA's. Mild LEFT cervical ICA FMD versus artifact. 3. Patent vertebral arteries. CTA HEAD: 1. 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 Neuro-Interventional Radiology consultation is suggested  to evaluate the appropriateness of potential treatment. Emergency evaluation can be requested by paging [phone]. 2. Mild luminal irregularity of cerebral arteries concerning for vasospasm. Critical Value/emergent results were called by telephone at the time of interpretation on 05/09/2018 at 7:05 pm to [facility] , who verbally acknowledged these results. Electronically Signed   By: Elon Alas M.D.   On: 05/09/2018 19:17    Procedures .Critical Care Performed by: Kinnie Feil, PA-C Authorized by: Kinnie Feil, PA-C   Critical care provider statement:    Critical care was necessary to treat or prevent imminent or life-threatening deterioration of the following conditions: ruptured aneurysm with SAH.   Critical care was time spent personally by me on the following activities:  Development of treatment plan with patient or surrogate, discussions with consultants, evaluation of patient's response to treatment, examination of patient, obtaining history from patient or surrogate, ordering and performing treatments and interventions, ordering and review of laboratory studies, ordering and review of radiographic studies, re-evaluation of patient's condition and review of old charts   I assumed direction of critical care for this patient from another provider in my specialty: no     (including critical care time)  Medications Ordered in ED Medications  iopamidol (ISOVUE-370) 76 % injection (has no administration in time range)  sodium chloride 0.9 % bolus 1,000 mL (0 mLs Intravenous Stopped 05/09/18 1719)  metoCLOPramide (REGLAN) injection 10 mg (10 mg Intravenous Given 05/09/18 1615)  acetaminophen (TYLENOL) tablet 1,000 mg (1,000 mg Oral Given 05/09/18 1615)  iopamidol (ISOVUE-370) 76 % injection 75 mL (100 mLs Intravenous Contrast Given 05/09/18 1800)     Initial Impression / Assessment and Plan / ED Course  I have reviewed the triage vital signs and the nursing  notes.  Pertinent labs & imaging results that were available during my care of the patient were reviewed by me and considered in my medical decision making (see chart for details).  Clinical Course as of May 09 2020  Thu May 09, 2018  1720 Reevaluated patient, states her pain has come down from a 9 to a 7.  Her pain is in her head and her neck and back.  Awaiting for CT.   [CG]  1926 1. 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 Neuro-Interventional Radiology consultation is suggested to evaluate the appropriateness of potential treatment. Emergency evaluation can be requested by paging [phone]. 2. Mild luminal irregularity of cerebral arteries concerning for vasospasm.    CT Angio Head W or Wo Contrast [CG]    Clinical Course User Index [CG] Kinnie Feil, PA-C   Highest on ddx includes tension vs sinusitis vs migraine HA. She has chronic nasal congestion. Recent heavy braids put in that could be causing increased neck muscle tensions/pain.  Given new onset HA in setting of HTN however also consider SAH.  She has no temporal tenderness and doubt GCA. No visual complains and no risk factors for blood clots making IIH unlikely.  Given neck pain, also considering neck dissection especially in setting of new heavy braids.  Meningitis considered but she has no fevers, photophobia, rash.  On exam, she has decreased ROM of neck secondary to pain..  We will treat HA, reassess before considering LP.   1925: CT angio shows ruptured a-comm aneurysm.  Spoke to Dr.Stern who will see patient in the ER.  Patient has been updated.  Will place on cardiac monitor. Final Clinical Impressions(s) / ED Diagnoses   Dr. Vertell Limber will admit pt for endovascular repair.  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

## 2018-05-09 NOTE — ED Triage Notes (Signed)
Headache for 3 days: pain in forehead and back of head, base.  Pain then goes from base of neck, down her back.   Reports no history of this.    Patient reports vomiting for 3 days.  Patient has vomited 2 times today

## 2018-05-09 NOTE — ED Triage Notes (Signed)
Pt arrives from urgent care for additional care for a migraine headache, neck pain radiating down to back, and  projectile vomiting x2 today for 3 days. Pt is still breastfeeding her 41 month old. Pt states she had a headache like this in the past when she did not take her BP meds, but she has been taking them regularly this time. Pt placed in position of comfort with call bell in reach, alert and oriented. Reports no recent falls.

## 2018-05-09 NOTE — ED Provider Notes (Signed)
[place]    CSN: [id number] Arrival date & time: 05/09/18  1326     History   Chief Complaint Chief Complaint  Patient presents with  . Headache  . Back Pain    HPI [name] [name] is a 32 y.o. female.   Patient is a 32 year old female that presents with 3 days of headache and what she describes as " the worst headache of my life".  She has had multiple episodes of projectile vomiting associated with it.  The headache is located in the frontal region of her head and then radiates into the occipital region and neck.  She is reporting stiffness in the neck.  She denies any associated fever, chills but is currently nauseous.  She has had mild photophobia.  Reports that it is hard for her to lift her head up and she has very limited rotation of the neck. .  She feels very weak and fatigued.  She denies any history of migraines but does have a history of hypertension.  Denies any head injuries or recent sick contacts.   ROS per HPI    Headache  Associated symptoms: back pain   Back Pain  Associated symptoms: headaches     Past Medical History:  Diagnosis Date  . Asthma, allergic    uses inhaler prn - infrequently  . Bilateral thoracic back pain 09/13/2015  . Eczema   . Hypertension   . Lactose intolerance   . Mature cystic teratoma of ovary   . Mood swings 06/01/2015  . Seasonal allergies     Patient Active Problem List   Diagnosis Date Noted  . Gastroesophageal reflux disease without esophagitis 10/09/2016  . BMI 31.0-31.9,adult 07/20/2016  . Obesity in pregnancy, antepartum 07/20/2016  . Tobacco abuse 06/01/2015  . Marijuana use 06/23/2014    Past Surgical History:  Procedure Laterality Date  . TUBAL LIGATION N/A 12/15/2016   Procedure: POST PARTUM TUBAL LIGATION;  Surgeon: Mora Bellman, MD;  Location: [facility];  Service: Gynecology;  Laterality: N/A;  . WISDOM TOOTH EXTRACTION      OB History    Gravida  3   Para  3   Term  3   Preterm    0   AB  0   Living  3     SAB  0   TAB  0   Ectopic  0   Multiple  0   Live Births  3            Home Medications    Prior to Admission medications   Medication Sig Start Date End Date Taking? Authorizing Provider  acetaminophen (TYLENOL) 325 MG tablet Take 650 mg by mouth every 6 (six) hours as needed.   Yes [provider]  amLODipine (NORVASC) 10 MG tablet Take 1 tablet (10 mg total) by mouth daily. To lower blood pressure 04/12/18  Yes Fulp, Cammie, MD  cetirizine (ZYRTEC) 10 MG tablet TAKE 1 TABLET BY MOUTH ONCE DAILY 03/07/18  Yes Newlin, Enobong, MD  montelukast (SINGULAIR) 10 MG tablet TAKE 1 TABLET BY MOUTH ONCE DAILY AT BEDTIME 03/07/18  Yes Newlin, Enobong, MD  omeprazole (PRILOSEC) 20 MG capsule TAKE 1 CAPSULE BY MOUTH ONCE DAILY 03/07/18  Yes Charlott Rakes, MD  Prenatal Vit w/Fe-Methylfol-FA (PNV PO) Take by mouth.   Yes [provider]  clobetasol ointment (TEMOVATE) 9.24 % Apply 1 application topically 2 (two) times daily. 04/12/18   Fulp, Cammie, MD  ibuprofen (ADVIL,MOTRIN) 600 MG tablet Take  1 tablet (600 mg total) by mouth every 6 (six) hours. X 1week then prn pain 10/25/17   Argentina Donovan, PA-C  triamcinolone cream (KENALOG) 0.1 % Apply 1 application topically 2 (two) times daily as needed. To affected areas for eczema. 06/22/16   Donnamae Jude, MD    Family History Family History  Adopted: Yes  Problem Relation Age of Onset  . Hypertension Father   . Hypertension Maternal Aunt   . Hypertension Maternal Uncle   . Hypertension Maternal Grandmother   . Eczema Maternal Grandmother   . Hypertension Maternal Grandfather   . Hypertension Paternal Grandmother   . Hypertension Mother   . Eczema Mother     Social History Social History   Tobacco Use  . Smoking status: Current Every Day Smoker    Packs/day: 0.25    Types: Cigarettes  . Smokeless tobacco: Former Network engineer Use Topics  . Alcohol use: No  . Drug use: No      Allergies   Sulfa antibiotics and Shellfish allergy   Review of Systems Review of Systems  Musculoskeletal: Positive for back pain.  Neurological: Positive for headaches.     Physical Exam Triage Vital Signs ED Triage Vitals  Enc Vitals Group     BP 05/09/18 1438 (!) 150/94     Pulse Rate 05/09/18 1438 (!) 59     Resp 05/09/18 1438 18     Temp 05/09/18 1438 98.1 F (36.7 C)     Temp Source 05/09/18 1438 Oral     SpO2 05/09/18 1438 100 %     Weight --      Height --      Head Circumference --      Peak Flow --      Pain Score 05/09/18 1434 9     Pain Loc --      Pain Edu? --      Excl. in [place]? --    No data found.  Updated Vital Signs BP (!) 150/94 (BP Location: Right Arm)   Pulse (!) 59   Temp 98.1 F (36.7 C) (Oral)   Resp 18   LMP 05/03/2018   SpO2 100%   Breastfeeding? Yes   Visual Acuity Right Eye Distance:   Left Eye Distance:   Bilateral Distance:    Right Eye Near:   Left Eye Near:    Bilateral Near:     Physical Exam  Constitutional: She appears well-developed and well-nourished. She appears ill.  HENT:  Head: Normocephalic and atraumatic.  Eyes: Pupils are equal, round, and reactive to light. EOM are normal. Right eye exhibits no nystagmus. Left eye exhibits no nystagmus.  Neck:  Pt has very limited ROM of the neck especially with flexion and extension. Tender to palpation of the neck.   Pulmonary/Chest: Effort normal.  Neurological: She is alert.  Skin: Skin is warm and dry.  Psychiatric: She has a normal mood and affect.  Nursing note and vitals reviewed.    UC Treatments / Results  Labs (all labs ordered are listed, but only abnormal results are displayed) Labs Reviewed - No data to display  EKG None  Radiology No results found.  Procedures Procedures (including critical care time)  Medications Ordered in UC Medications - No data to display  Initial Impression / Assessment and Plan / UC Course  I have reviewed the  triage vital signs and the nursing notes.  Pertinent labs & imaging results that were available during my care  of the patient were reviewed by me and considered in my medical decision making (see chart for details).     Patient is a 32 year old female with 3 days of headache which she describes as a workers headache of her life. She has had associated projectile vomiting and nuchal rigidity She denies any history of migraines or headaches He does have a history of hypertension Reports she is unable to hold her head up She is slightly hypertensive but afebrile We will send her to the ER for further evaluation to r/o intracranial abnormality vs meningitis.  Final Clinical Impressions(s) / UC Diagnoses   Final diagnoses:  Bad headache  Nausea and vomiting, intractability of vomiting not specified, unspecified vomiting type     Discharge Instructions     Please go to the ER for further evaluation of your headache    ED Prescriptions    None     Controlled Substance Prescriptions [place] Controlled Substance Registry consulted? Not Applicable   Orvan July, NP 05/09/18 1523

## 2018-05-09 NOTE — ED Notes (Signed)
Pt's family bedside, pt breastfeeding.

## 2018-05-09 NOTE — Discharge Instructions (Addendum)
Please go to the ER for further evaluation of your headache.  °

## 2018-05-09 NOTE — ED Notes (Signed)
ED Provider at bedside. 

## 2018-05-10 ENCOUNTER — Inpatient Hospital Stay (HOSPITAL_COMMUNITY): Payer: Medicaid Other

## 2018-05-10 ENCOUNTER — Encounter (HOSPITAL_COMMUNITY): Payer: Self-pay | Admitting: Emergency Medicine

## 2018-05-10 ENCOUNTER — Ambulatory Visit: Payer: Self-pay | Admitting: Allergy

## 2018-05-10 ENCOUNTER — Inpatient Hospital Stay (HOSPITAL_COMMUNITY): Payer: Medicaid Other | Admitting: Anesthesiology

## 2018-05-10 ENCOUNTER — Encounter (HOSPITAL_COMMUNITY): Payer: Self-pay | Admitting: Certified Registered Nurse Anesthetist

## 2018-05-10 ENCOUNTER — Encounter (HOSPITAL_COMMUNITY)
Admission: EM | Disposition: A | Discharge status: Rehabilitation Facility | Payer: Self-pay | Source: Home / Self Care | Attending: Neurosurgery

## 2018-05-10 HISTORY — PX: CRANIOTOMY: SHX93

## 2018-05-10 HISTORY — PX: IR ANGIO VERTEBRAL SEL VERTEBRAL BILAT MOD SED: IMG5369

## 2018-05-10 HISTORY — PX: IR ANGIO INTRA EXTRACRAN SEL INTERNAL CAROTID BILAT MOD SED: IMG5363

## 2018-05-10 HISTORY — PX: RADIOLOGY WITH ANESTHESIA: SHX6223

## 2018-05-10 HISTORY — PX: IR 3D INDEPENDENT WKST: IMG2385

## 2018-05-10 LAB — POCT I-STAT 3, ART BLOOD GAS (G3+)
ACID-BASE DEFICIT: 4 mmol/L — AB (ref 0.0–2.0)
Bicarbonate: 22.2 mmol/L (ref 20.0–28.0)
O2 SAT: 99 %
TCO2: 23 mmol/L (ref 22–32)
pCO2 arterial: 41.8 mmHg (ref 32.0–48.0)
pH, Arterial: 7.332 — ABNORMAL LOW (ref 7.350–7.450)
pO2, Arterial: 144 mmHg — ABNORMAL HIGH (ref 83.0–108.0)

## 2018-05-10 LAB — MRSA PCR SCREENING: MRSA by PCR: NEGATIVE

## 2018-05-10 LAB — ABO/RH: ABO/RH(D): B POS

## 2018-05-10 LAB — SURGICAL PCR SCREEN
MRSA, PCR: NEGATIVE
STAPHYLOCOCCUS AUREUS: POSITIVE — AB

## 2018-05-10 LAB — PREPARE RBC (CROSSMATCH)

## 2018-05-10 SURGERY — CRANIOTOMY INTRACRANIAL ANEURYSM FOR CAROTID
Anesthesia: General | Site: Head

## 2018-05-10 SURGERY — IR WITH ANESTHESIA
Anesthesia: General

## 2018-05-10 MED ORDER — WHITE PETROLATUM EX OINT
TOPICAL_OINTMENT | CUTANEOUS | Status: AC
  Administered 2018-05-10: 07:00:00
  Filled 2018-05-10: qty 28.35

## 2018-05-10 MED ORDER — FENTANYL CITRATE (PF) 100 MCG/2ML IJ SOLN
100.0000 ug | INTRAMUSCULAR | Status: DC | PRN
  Administered 2018-05-11 (×2): 100 ug via INTRAVENOUS
  Filled 2018-05-10 (×2): qty 2

## 2018-05-10 MED ORDER — ROCURONIUM BROMIDE 50 MG/5ML IV SOSY
PREFILLED_SYRINGE | INTRAVENOUS | Status: AC
  Filled 2018-05-10: qty 10

## 2018-05-10 MED ORDER — MUPIROCIN 2 % EX OINT
1.0000 "application " | TOPICAL_OINTMENT | Freq: Two times a day (BID) | CUTANEOUS | Status: DC
  Administered 2018-05-10 – 2018-05-14 (×9): 1 via NASAL
  Filled 2018-05-10: qty 22

## 2018-05-10 MED ORDER — LIDOCAINE 2% (20 MG/ML) 5 ML SYRINGE
INTRAMUSCULAR | Status: DC | PRN
  Administered 2018-05-10: 60 mg via INTRAVENOUS

## 2018-05-10 MED ORDER — INDOCYANINE GREEN 25 MG IV SOLR
INTRAVENOUS | Status: DC | PRN
  Administered 2018-05-10 (×2): 12.5 mg via INTRAVENOUS

## 2018-05-10 MED ORDER — INDOCYANINE GREEN 25 MG IV SOLR
INTRAVENOUS | Status: AC
  Filled 2018-05-10: qty 25

## 2018-05-10 MED ORDER — THROMBIN (RECOMBINANT) 20000 UNITS EX SOLR
CUTANEOUS | Status: AC
  Filled 2018-05-10: qty 20000

## 2018-05-10 MED ORDER — 0.9 % SODIUM CHLORIDE (POUR BTL) OPTIME
TOPICAL | Status: DC | PRN
  Administered 2018-05-10 (×3): 1000 mL

## 2018-05-10 MED ORDER — CEFAZOLIN SODIUM-DEXTROSE 2-3 GM-%(50ML) IV SOLR
INTRAVENOUS | Status: DC | PRN
  Administered 2018-05-10: 2 g via INTRAVENOUS

## 2018-05-10 MED ORDER — BACITRACIN ZINC 500 UNIT/GM EX OINT
TOPICAL_OINTMENT | CUTANEOUS | Status: DC | PRN
  Administered 2018-05-10: 1 via TOPICAL

## 2018-05-10 MED ORDER — SUGAMMADEX SODIUM 500 MG/5ML IV SOLN
INTRAVENOUS | Status: DC | PRN
  Administered 2018-05-10: 200 mg via INTRAVENOUS
  Administered 2018-05-10: 300 mg via INTRAVENOUS

## 2018-05-10 MED ORDER — MIDAZOLAM HCL 2 MG/2ML IJ SOLN: 2.0000 mg | INTRAMUSCULAR | Status: DC | PRN

## 2018-05-10 MED ORDER — LEVETIRACETAM IN NACL 1000 MG/100ML IV SOLN
1000.0000 mg | INTRAVENOUS | Status: AC
  Administered 2018-05-10: 1000 mg via INTRAVENOUS
  Filled 2018-05-10: qty 100

## 2018-05-10 MED ORDER — BUPIVACAINE HCL 0.5 % IJ SOLN
INTRAMUSCULAR | Status: DC | PRN
  Administered 2018-05-10: 5 mL

## 2018-05-10 MED ORDER — SODIUM CHLORIDE 0.9 % IV SOLN
INTRAVENOUS | Status: DC | PRN
  Administered 2018-05-10: 30 ug/min via INTRAVENOUS

## 2018-05-10 MED ORDER — PROPOFOL 1000 MG/100ML IV EMUL
0.0000 ug/kg/min | INTRAVENOUS | Status: DC
  Administered 2018-05-10 – 2018-05-11 (×2): 50 ug/kg/min via INTRAVENOUS
  Administered 2018-05-11: 10 ug/kg/min via INTRAVENOUS
  Administered 2018-05-11: 50 ug/kg/min via INTRAVENOUS
  Administered 2018-05-11: 20 ug/kg/min via INTRAVENOUS
  Administered 2018-05-11: 25 ug/kg/min via INTRAVENOUS
  Administered 2018-05-11: 40 ug/kg/min via INTRAVENOUS
  Administered 2018-05-12: 25 ug/kg/min via INTRAVENOUS
  Administered 2018-05-12 – 2018-05-13 (×2): 10 ug/kg/min via INTRAVENOUS
  Administered 2018-05-13: 15 ug/kg/min via INTRAVENOUS
  Administered 2018-05-14: 5 ug/kg/min via INTRAVENOUS
  Filled 2018-05-10 (×11): qty 100

## 2018-05-10 MED ORDER — ROCURONIUM BROMIDE 50 MG/5ML IV SOSY
PREFILLED_SYRINGE | INTRAVENOUS | Status: DC | PRN
  Administered 2018-05-10 (×2): 50 mg via INTRAVENOUS

## 2018-05-10 MED ORDER — PROPOFOL 1000 MG/100ML IV EMUL
INTRAVENOUS | Status: AC
  Filled 2018-05-10: qty 100

## 2018-05-10 MED ORDER — MANNITOL 25 % IV SOLN
INTRAVENOUS | Status: DC | PRN
  Administered 2018-05-10 (×2): 25 g via INTRAVENOUS

## 2018-05-10 MED ORDER — PROPOFOL 500 MG/50ML IV EMUL
INTRAVENOUS | Status: DC | PRN
  Administered 2018-05-10: 75 ug/kg/min via INTRAVENOUS

## 2018-05-10 MED ORDER — IOPAMIDOL (ISOVUE-300) INJECTION 61%
INTRAVENOUS | Status: AC
  Administered 2018-05-10: 81 mL
  Filled 2018-05-10: qty 100

## 2018-05-10 MED ORDER — THROMBIN 5000 UNITS EX SOLR
CUTANEOUS | Status: AC
  Filled 2018-05-10: qty 5000

## 2018-05-10 MED ORDER — BACITRACIN ZINC 500 UNIT/GM EX OINT
TOPICAL_OINTMENT | CUTANEOUS | Status: AC
  Filled 2018-05-10: qty 28.35

## 2018-05-10 MED ORDER — FENTANYL CITRATE (PF) 100 MCG/2ML IJ SOLN
INTRAMUSCULAR | Status: DC | PRN
  Administered 2018-05-10: 100 ug via INTRAVENOUS

## 2018-05-10 MED ORDER — FENTANYL CITRATE (PF) 100 MCG/2ML IJ SOLN
INTRAMUSCULAR | Status: AC
  Filled 2018-05-10: qty 2

## 2018-05-10 MED ORDER — PROPOFOL 10 MG/ML IV BOLUS
INTRAVENOUS | Status: DC | PRN
  Administered 2018-05-10: 50 mg via INTRAVENOUS
  Administered 2018-05-10: 150 mg via INTRAVENOUS

## 2018-05-10 MED ORDER — ONDANSETRON HCL 4 MG/2ML IJ SOLN
INTRAMUSCULAR | Status: DC | PRN
  Administered 2018-05-10: 4 mg via INTRAVENOUS

## 2018-05-10 MED ORDER — PHENYLEPHRINE 40 MCG/ML (10ML) SYRINGE FOR IV PUSH (FOR BLOOD PRESSURE SUPPORT)
PREFILLED_SYRINGE | INTRAVENOUS | Status: DC | PRN
  Administered 2018-05-10: 120 ug via INTRAVENOUS
  Administered 2018-05-10: 80 ug via INTRAVENOUS
  Administered 2018-05-10: 40 ug via INTRAVENOUS

## 2018-05-10 MED ORDER — HEMOSTATIC AGENTS (NO CHARGE) OPTIME
TOPICAL | Status: DC | PRN
  Administered 2018-05-10: 1 via TOPICAL

## 2018-05-10 MED ORDER — PHENYLEPHRINE 40 MCG/ML (10ML) SYRINGE FOR IV PUSH (FOR BLOOD PRESSURE SUPPORT)
PREFILLED_SYRINGE | INTRAVENOUS | Status: AC
  Filled 2018-05-10: qty 10

## 2018-05-10 MED ORDER — CHLORHEXIDINE GLUCONATE CLOTH 2 % EX PADS
6.0000 | MEDICATED_PAD | Freq: Every day | CUTANEOUS | Status: DC
  Administered 2018-05-10 – 2018-05-14 (×4): 6 via TOPICAL

## 2018-05-10 MED ORDER — DEXAMETHASONE SODIUM PHOSPHATE 10 MG/ML IJ SOLN
INTRAMUSCULAR | Status: DC | PRN
  Administered 2018-05-10: 10 mg via INTRAVENOUS

## 2018-05-10 MED ORDER — ONDANSETRON HCL 4 MG/2ML IJ SOLN
INTRAMUSCULAR | Status: AC
  Filled 2018-05-10: qty 2

## 2018-05-10 MED ORDER — SODIUM CHLORIDE 0.9 % IV SOLN
INTRAVENOUS | Status: DC | PRN
  Administered 2018-05-10: 19:00:00

## 2018-05-10 MED ORDER — THROMBIN 5000 UNITS EX SOLR
OROMUCOSAL | Status: DC | PRN
  Administered 2018-05-10: 19:00:00 via TOPICAL

## 2018-05-10 MED ORDER — MIDAZOLAM HCL 2 MG/2ML IJ SOLN
2.0000 mg | INTRAMUSCULAR | Status: DC | PRN
  Administered 2018-05-11: 2 mg via INTRAVENOUS
  Filled 2018-05-10: qty 2

## 2018-05-10 MED ORDER — LABETALOL HCL 5 MG/ML IV SOLN
INTRAVENOUS | Status: DC | PRN
  Administered 2018-05-10: 10 mg via INTRAVENOUS

## 2018-05-10 MED ORDER — BUPIVACAINE HCL (PF) 0.5 % IJ SOLN
INTRAMUSCULAR | Status: AC
  Filled 2018-05-10: qty 30

## 2018-05-10 MED ORDER — FENTANYL CITRATE (PF) 100 MCG/2ML IJ SOLN
100.0000 ug | INTRAMUSCULAR | Status: AC | PRN
  Administered 2018-05-10 – 2018-05-11 (×2): 100 ug via INTRAVENOUS
  Filled 2018-05-10: qty 2

## 2018-05-10 MED ORDER — THROMBIN 20000 UNITS EX SOLR
CUTANEOUS | Status: DC | PRN
  Administered 2018-05-10: 19:00:00 via TOPICAL

## 2018-05-10 MED ORDER — IOPAMIDOL (ISOVUE-300) INJECTION 61%
INTRAVENOUS | Status: AC
  Filled 2018-05-10: qty 200

## 2018-05-10 MED ORDER — LIDOCAINE HCL (PF) 1 % IJ SOLN
INTRAMUSCULAR | Status: AC
  Filled 2018-05-10: qty 30

## 2018-05-10 MED ORDER — LIDOCAINE HCL (PF) 1 % IJ SOLN
INTRAMUSCULAR | Status: DC | PRN
  Administered 2018-05-10: 5 mL

## 2018-05-10 SURGICAL SUPPLY — 100 items
APL SKNCLS STERI-STRIP NONHPOA (GAUZE/BANDAGES/DRESSINGS)
BAG DECANTER FOR FLEXI CONT (MISCELLANEOUS) ×3 IMPLANT
BATTERY IQ STERILE (MISCELLANEOUS) ×2 IMPLANT
BENZOIN TINCTURE PRP APPL 2/3 (GAUZE/BANDAGES/DRESSINGS) IMPLANT
BIT DRILL WIRE PASS 1.3MM (BIT) IMPLANT
BLADE SAW GIGLI 16 STRL (MISCELLANEOUS) IMPLANT
BLADE SURG 15 STRL LF DISP TIS (BLADE) ×1 IMPLANT
BLADE SURG 15 STRL SS (BLADE) ×3
BLADE ULTRA TIP 2M (BLADE) IMPLANT
BNDG CMPR 75X41 PLY ABS (GAUZE/BANDAGES/DRESSINGS) ×1
BNDG CMPR 75X41 PLY HI ABS (GAUZE/BANDAGES/DRESSINGS)
BNDG GAUZE ELAST 4 BULKY (GAUZE/BANDAGES/DRESSINGS) ×4 IMPLANT
BNDG STRETCH 4X75 NS LF (GAUZE/BANDAGES/DRESSINGS) ×2 IMPLANT
BNDG STRETCH 4X75 STRL LF (GAUZE/BANDAGES/DRESSINGS) IMPLANT
BTRY SRG DRVR 1.5 IQ (MISCELLANEOUS) ×1
BUR ACORN 6.0 PRECISION (BURR) ×1 IMPLANT
BUR ACORN 6.0MM PRECISION (BURR) ×1
BUR MATCHSTICK NEURO 3.0 LAGG (BURR) IMPLANT
BUR ROUND FLUTED 4 SOFT TCH (BURR) ×1 IMPLANT
BUR ROUND FLUTED 4MM SOFT TCH (BURR) ×1
BUR ROUND FLUTED 5 RND (BURR) ×1 IMPLANT
BUR ROUND FLUTED 5MM RND (BURR) ×1
BUR SPIRAL ROUTER 2.3 (BUR) ×1 IMPLANT
BUR SPIRAL ROUTER 2.3MM (BUR) ×1
CANISTER SUCT 3000ML PPV (MISCELLANEOUS) ×3 IMPLANT
CARTRIDGE OIL MAESTRO DRILL (MISCELLANEOUS) ×1 IMPLANT
CLIP ANEURY TI PERM MINI 6.6M (Clip) ×2 IMPLANT
CLIP ANEURY TI PERM STD 8.3 (Clip) ×2 IMPLANT
CLIP ANEURY TI PERM STD STR 9M (Clip) ×2 IMPLANT
CLIP VESOCCLUDE MED 6/CT (CLIP) IMPLANT
COVER MAYO STAND STRL (DRAPES) IMPLANT
COVER WAND RF STERILE (DRAPES) ×3 IMPLANT
DIFFUSER DRILL AIR PNEUMATIC (MISCELLANEOUS) ×3 IMPLANT
DRAPE MICROSCOPE LEICA (MISCELLANEOUS) ×3 IMPLANT
DRAPE NEUROLOGICAL W/INCISE (DRAPES) ×3 IMPLANT
DRAPE WARM FLUID 44X44 (DRAPE) ×3 IMPLANT
DRILL WIRE PASS 1.3MM (BIT)
DRSG ADAPTIC 3X8 NADH LF (GAUZE/BANDAGES/DRESSINGS) ×3 IMPLANT
DRSG EMULSION OIL 3X3 NADH (GAUZE/BANDAGES/DRESSINGS) ×2 IMPLANT
DRSG TELFA 3X8 NADH (GAUZE/BANDAGES/DRESSINGS) ×3 IMPLANT
DURAMATRIX ONLAY 3X3 (Plate) ×2 IMPLANT
DURAPREP 6ML APPLICATOR 50/CS (WOUND CARE) ×3 IMPLANT
ELECT REM PT RETURN 9FT ADLT (ELECTROSURGICAL) ×3
ELECTRODE REM PT RTRN 9FT ADLT (ELECTROSURGICAL) ×1 IMPLANT
EVACUATOR SILICONE 100CC (DRAIN) IMPLANT
FORCEPS BIPOLAR SPETZLER 8 1.0 (NEUROSURGERY SUPPLIES) IMPLANT
GAUZE 4X4 16PLY RFD (DISPOSABLE) IMPLANT
GAUZE SPONGE 4X4 12PLY STRL (GAUZE/BANDAGES/DRESSINGS) IMPLANT
GLOVE BIO SURGEON STRL SZ7.5 (GLOVE) IMPLANT
GLOVE BIOGEL PI IND STRL 7.0 (GLOVE) IMPLANT
GLOVE BIOGEL PI IND STRL 7.5 (GLOVE) ×2 IMPLANT
GLOVE BIOGEL PI INDICATOR 7.0 (GLOVE) ×2
GLOVE BIOGEL PI INDICATOR 7.5 (GLOVE) ×6
GLOVE ECLIPSE 7.0 STRL STRAW (GLOVE) ×6 IMPLANT
GLOVE SS N UNI LF 6.5 STRL (GLOVE) ×4 IMPLANT
GLOVE SS N UNI LF 7.0 STRL (GLOVE) ×4 IMPLANT
GOWN STRL REUS W/ TWL LRG LVL3 (GOWN DISPOSABLE) ×2 IMPLANT
GOWN STRL REUS W/TWL LRG LVL3 (GOWN DISPOSABLE) ×6
HEMOSTAT POWDER KIT SURGIFOAM (HEMOSTASIS) ×2 IMPLANT
HEMOSTAT SURGICEL 2X14 (HEMOSTASIS) ×3 IMPLANT
HOOK DURA 1/2IN (MISCELLANEOUS) ×3 IMPLANT
KIT BASIN OR (CUSTOM PROCEDURE TRAY) ×3 IMPLANT
KIT DRAIN CSF ACCUDRAIN (MISCELLANEOUS) IMPLANT
KIT TURNOVER KIT B (KITS) ×3 IMPLANT
KNIFE ARACHNOID DISP AM-24-S (MISCELLANEOUS) ×2 IMPLANT
NDL HYPO 25X1 1.5 SAFETY (NEEDLE) ×1 IMPLANT
NDL SPNL 25GX3.5 QUINCKE BL (NEEDLE) IMPLANT
NEEDLE HYPO 22GX1.5 SAFETY (NEEDLE) IMPLANT
NEEDLE HYPO 25X1 1.5 SAFETY (NEEDLE) ×3 IMPLANT
NEEDLE SPNL 25GX3.5 QUINCKE BL (NEEDLE) IMPLANT
NS IRRIG 1000ML POUR BTL (IV SOLUTION) ×7 IMPLANT
OIL CARTRIDGE MAESTRO DRILL (MISCELLANEOUS) ×3
PACK CRANIOTOMY CUSTOM (CUSTOM PROCEDURE TRAY) ×3 IMPLANT
PAD ARMBOARD 7.5X6 YLW CONV (MISCELLANEOUS) ×5 IMPLANT
PAD DRESSING TELFA 3X8 NADH (GAUZE/BANDAGES/DRESSINGS) ×1 IMPLANT
PATTIES SURGICAL .25X.25 (GAUZE/BANDAGES/DRESSINGS) ×2 IMPLANT
PERFORATOR LRG  14-11MM (BIT)
PERFORATOR LRG 14-11MM (BIT) IMPLANT
PIN MAYFIELD SKULL DISP (PIN) ×2 IMPLANT
PLATE 1.5  2HOLE LNG NEURO (Plate) ×6 IMPLANT
PLATE 1.5 2HOLE LNG NEURO (Plate) IMPLANT
PLATE 1.5 4HOLE LONG STRAIGHT (Plate) ×2 IMPLANT
RUBBERBAND STERILE (MISCELLANEOUS) ×6 IMPLANT
SCREW SELF DRILL HT 1.5/4MM (Screw) ×16 IMPLANT
SPONGE NEURO XRAY DETECT 1X3 (DISPOSABLE) IMPLANT
SPONGE SURGIFOAM ABS GEL 100 (HEMOSTASIS) ×3 IMPLANT
STAPLER VISISTAT 35W (STAPLE) ×5 IMPLANT
STOCKINETTE 6  STRL (DRAPES) ×2
STOCKINETTE 6 STRL (DRAPES) ×1 IMPLANT
SUT ETHILON 3 0 FSL (SUTURE) IMPLANT
SUT NURALON 4 0 TR CR/8 (SUTURE) ×6 IMPLANT
SUT VIC AB 0 CT1 18XCR BRD8 (SUTURE) ×2 IMPLANT
SUT VIC AB 0 CT1 8-18 (SUTURE) ×6
SUT VIC AB 3-0 SH 8-18 (SUTURE) ×5 IMPLANT
TIP NONSTICK .5MMX23CM (INSTRUMENTS) ×3
TIP NONSTICK .5X23 (INSTRUMENTS) IMPLANT
TOWEL GREEN STERILE (TOWEL DISPOSABLE) ×3 IMPLANT
TOWEL GREEN STERILE FF (TOWEL DISPOSABLE) ×3 IMPLANT
UNDERPAD 30X30 (UNDERPADS AND DIAPERS) IMPLANT
WATER STERILE IRR 1000ML POUR (IV SOLUTION) ×3 IMPLANT

## 2018-05-10 NOTE — Progress Notes (Signed)
  NEUROSURGERY PROGRESS NOTE   No issues overnight. History reviewed. Briefly, presented with HA which actually started about 3d ago. No associated N/T/W, visual changes. (+) smoking hx ~1/2ppd. No FHx of aneurysms.  EXAM:  BP (!) 119/58   Pulse 73   Temp 98.5 F (36.9 C) (Oral)   Resp 19   Ht 5\' 10"  (1.778 m)   Wt 101 kg   LMP 05/03/2018   SpO2 96%   BMI 31.95 kg/m   A little drowsy but easily arouses, maintains wakefulness, oriented  Speech fluent, appropriate  CN grossly intact  5/5 BUE/BLE   IMPRESSION:  32 y.o. 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  PLAN: - Will proceed with diagnostic angiogram and appropriate treatment of aneurysm  I spoke at length with the patient and her mother regarding the imaging findings thus far. I reviewed with them the possible treatment options for intracranial aneurysms including endovascular coiling and open clip ligation. The risks of the angiogram, coiling, and surgical clipping were also reviewed to include stroke and aneurysm re-rupture leading to weakness/paralysis/coma/death, infection, SZ, hydrocephalus.   The patient and her mother appeared to understand our discussion and they provided consent to proceed with diagnostic angiogram and the appropriate treatment for any identified aneurysm. All questions this am were answered.

## 2018-05-10 NOTE — Transfer of Care (Signed)
Immediate Anesthesia Transfer of Care Note  Patient: [name] [name]  Procedure(s) Performed: EMBOLIZATION OF ANEURSYM (N/A )  Patient Location: ICU  Anesthesia Type:General  Level of Consciousness: sedated and Patient remains intubated per anesthesia plan  Airway & Oxygen Therapy: Patient remains intubated per anesthesia plan and Patient placed on Ventilator (see vital sign flow sheet for setting)  Post-op Assessment: Report given to RN and Post -op Vital signs reviewed and stable  Post vital signs: Reviewed and stable  Last Vitals:  Vitals Value Taken Time  BP 165/80 05/10/2018 10:15 PM  Temp    Pulse 75 05/10/2018 10:19 PM  Resp 22 05/10/2018 10:19 PM  SpO2 100 % 05/10/2018 10:19 PM  Vitals shown include unvalidated device data.  Last Pain:  Vitals:   05/10/18 1235  TempSrc:   PainSc: 3       Patients Stated Pain Goal: 2 (47/20/72 1828)  Complications: No apparent anesthesia complications

## 2018-05-10 NOTE — Consult Note (Signed)
NAME:  [name] [name], MRN:  [id number], DOB:  1985/11/13, LOS: 1 ADMISSION DATE:  05/09/2018, CONSULTATION DATE:  05/10/2018 REFERRING MD:  Dr. Vertell Limber, CHIEF COMPLAINT:  Post op vent, Clipping   Brief History   32 year old female admitted 11/21 with SAH and taken to OR for clipping 11/22, but remained on the ventilator in the post-operative setting.   History of present illness   Patient is encephalopathic and/or intubated. Therefore history has been obtained from chart review.  32 year old female with past medical history as below, which is significant for hypertension.  She presented to the emergency department on 11/21 with complaints of frontal headache x3 days.  She complained of associated bilateral neck pain and tightness that radiated down to her entire back along with associated nausea and vomiting.  Relief of symptoms in the fetal position.  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  The treatment plan was initially for endovascular repair of the aneurysm, however, on diagnostic cerebral angiogram the aneurysm was not readily amenable for embolization.  On 11/22 she was taken to the OR under Dr. Kathyrn Sheriff for left pterional craniotomy and clipping of the aneurysm.  Postoperatively she remained on the ventilator and was transferred to the intensive care unit for recovery.  Pulmonary critical care medicine was asked to consult for ventilator management.   Past Medical History   has a past medical history of Asthma, allergic, Bilateral thoracic back pain (09/13/2015), Eczema, Hypertension, Lactose intolerance, Mature cystic teratoma of ovary, Mood swings (06/01/2015), and Seasonal allergies.  Significant Hospital Events   11/21 admit with [facility] 11/22 to OR for clipping of A. Com. Aneurysm.   Consults:    Procedures:  11/21 craniotomy, clipping of A.  Com aneurysm  Significant Diagnostic Tests:  CT head 11/21 > Small volume subarachnoid hemorrhage, likely redistributed into fourth ventricle with early suspected hydrocephalus. 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 Mild luminal irregularity of cerebral arteries concerning for vasospasm.  Micro Data:    Antimicrobials:    Interim history/subjective:    Objective   Blood pressure 132/68, pulse 90, temperature 100 F (37.8 C), temperature source Oral, resp. rate (!) 21, height 5\' 10"  (1.778 m), weight 101 kg, last menstrual period 05/03/2018, SpO2 98 %, currently breastfeeding.        Intake/Output Summary (Last 24 hours) at 05/10/2018 2223 Last data filed at 05/10/2018 2145 Gross per 24 hour  Intake 4672.94 ml  Output 3910 ml  Net 762.94 ml   Filed Weights   05/09/18 2145 05/10/18 1511  Weight: 101 kg 101 kg    Examination: General: overweight young adult female in NAD HENT: Surgical dressing in place. PERRL. No JVD Lungs: Clear, bilateral breath sounds Cardiovascular: RRR, no MRG Abdomen: Soft, non-tender, non-distended Extremities: No acute deformity. Moving LUE purposefully. Have not witnessed other extremity movement, granted she is on 40mcg propofol.  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  - Management per neurosurgery - Clevidipine to keep SBP < 148mm Hg  Acute encephalopathy in the post operative setting requiring mechanical ventilation - Full vent support - F/u CXR and ABG - Propofol infusion with PRN fentanyl and versed to keep RASS -1 to -2.  - SBT and WUA in the morning.  -  VAP bundle  Hypokalemia - repeat BMP  Best practice:  Diet: NPO Pain/Anxiety/Delirium protocol (if indicated): Propofol infusion. PRNs. RASS goal -1 to -2.  VAP protocol (if indicated): Per protocol DVT prophylaxis: none GI prophylaxis:  protonix Glucose control:  Mobility: Bed rest Code Status: FULL Family Communication:   Disposition: ICU  Labs   CBC: Recent Labs  Lab 05/09/18 1538  WBC 5.5  NEUTROABS 3.1  HGB 13.5  HCT 41.5  MCV 96.7  PLT 283    Basic Metabolic Panel: Recent Labs  Lab 05/09/18 1538  NA 138  K 3.4*  CL 104  CO2 24  GLUCOSE 106*  BUN 6  CREATININE 0.61  CALCIUM 9.4   GFR: Estimated Creatinine Clearance: 129.9 mL/min (by C-G formula based on SCr of 0.61 mg/dL). Recent Labs  Lab 05/09/18 1538  WBC 5.5    Liver Function Tests: Recent Labs  Lab 05/09/18 1948  AST 22  ALT 24  ALKPHOS 52  BILITOT 0.8  PROT 6.7  ALBUMIN 3.5   No results for input(s): LIPASE, AMYLASE in the last 168 hours. No results for input(s): AMMONIA in the last 168 hours.  ABG No results found for: PHART, PCO2ART, PO2ART, HCO3, TCO2, ACIDBASEDEF, O2SAT   Coagulation Profile: Recent Labs  Lab 05/09/18 1948  INR 1.00    Cardiac Enzymes: No results for input(s): CKTOTAL, CKMB, CKMBINDEX, TROPONINI in the last 168 hours.  HbA1C: Hgb A1c MFr Bld  Date/Time Value Ref Range Status  06/29/2016 03:04 PM 4.5 <5.7 % Final    Comment:      For the purpose of screening for the presence of diabetes:   <5.7%       Consistent with the absence of diabetes 5.7-6.4 %   Consistent with increased risk for diabetes (prediabetes) >=6.5 %     Consistent with diabetes   This assay result is consistent with a decreased risk of diabetes.   Currently, no consensus exists regarding use of hemoglobin A1c for diagnosis of diabetes in children.   According to American Diabetes Association (ADA) guidelines, hemoglobin A1c <7.0% represents optimal control in non-pregnant diabetic patients. Different metrics may apply to specific patient populations. Standards of Medical Care in Diabetes (ADA).     06/22/2016 03:39 PM CANCELED <5.7 %     Comment:    Test not performed.  The specimen was unlabeled.    Result  canceled by the ancillary     CBG: No results for input(s): GLUCAP in the last 168 hours.  Review of Systems:   unable  Past Medical History  She,  has a past medical history of Asthma, allergic, Bilateral thoracic back pain (09/13/2015), Eczema, Hypertension, Lactose intolerance, Mature cystic teratoma of ovary, Mood swings (06/01/2015), and Seasonal allergies.   Surgical History    Past Surgical History:  Procedure Laterality Date  . TUBAL LIGATION N/A 12/15/2016   Procedure: POST PARTUM TUBAL LIGATION;  Surgeon: Mora Bellman, MD;  Location: [facility];  Service: Gynecology;  Laterality: N/A;  . WISDOM TOOTH EXTRACTION       Social History   reports that she has been smoking cigarettes. She has been smoking about 0.25 packs per day. She has quit using smokeless tobacco. She reports that she does not drink alcohol or use drugs.   Family History   Her family history includes Eczema in her maternal grandmother and mother; Hypertension in her father, maternal aunt, maternal grandfather, maternal grandmother, maternal uncle, mother, and paternal grandmother. She was adopted.  Allergies Allergies  Allergen Reactions  . Sulfa Antibiotics Swelling    Causes swelling on the face.  . Shellfish Allergy Other (See Comments)    Stomach upset     Home Medications  Prior to Admission medications   Medication Sig Start Date End Date Taking? Authorizing Provider  amLODipine (NORVASC) 10 MG tablet Take 1 tablet (10 mg total) by mouth daily. To lower blood pressure 04/12/18  Yes Fulp, Cammie, MD  cetirizine (ZYRTEC) 10 MG tablet TAKE 1 TABLET BY MOUTH ONCE DAILY Patient taking differently: Take 10 mg by mouth daily.  03/07/18  Yes Charlott Rakes, MD  ibuprofen (ADVIL,MOTRIN) 600 MG tablet Take 1 tablet (600 mg total) by mouth every 6 (six) hours. X 1week then prn pain 10/25/17  Yes McClung, Angela M, PA-C  montelukast (SINGULAIR) 10 MG tablet TAKE 1 TABLET BY MOUTH ONCE DAILY AT  BEDTIME Patient taking differently: Take 10 mg by mouth at bedtime.  03/07/18  Yes Newlin, Charlane Ferretti, MD  omeprazole (PRILOSEC) 20 MG capsule TAKE 1 CAPSULE BY MOUTH ONCE DAILY Patient taking differently: Take 20 mg by mouth daily.  03/07/18  Yes Charlott Rakes, MD  clobetasol ointment (TEMOVATE) 4.09 % Apply 1 application topically 2 (two) times daily. Patient not taking: Reported on 05/09/2018 04/12/18   Fulp, Ander Gaster, MD  triamcinolone cream (KENALOG) 0.1 % Apply 1 application topically 2 (two) times daily as needed. To affected areas for eczema. Patient not taking: Reported on 05/09/2018 06/22/16   Donnamae Jude, MD     Critical care time: 27 min    Georgann Housekeeper, AGACNP-BC [place] Pager [phone] or [phone]  05/10/2018 10:49 PM

## 2018-05-10 NOTE — Sedation Documentation (Signed)
Patient under the care of anesthesia 

## 2018-05-10 NOTE — Progress Notes (Signed)
SLP Cancellation Note  Patient Details Name: [name] [name] MRN: [id number] DOB: 1985-12-16   Cancelled treatment:       Reason Eval/Treat Not Completed: Medical issues which prohibited therapy. Procedure planned for today. Will f/u for cognitive linguistic eval.    Lucillie Kiesel, [name] 05/10/2018, 10:50 AM

## 2018-05-10 NOTE — Anesthesia Procedure Notes (Signed)
Procedure Name: Intubation Date/Time: 05/10/2018 4:33 PM Performed by: Lance Coon, CRNA Pre-anesthesia Checklist: Patient identified, Emergency Drugs available, Suction available, Patient being monitored and Timeout performed Patient Re-evaluated:Patient Re-evaluated prior to induction Oxygen Delivery Method: Circle system utilized Preoxygenation: Pre-oxygenation with 100% oxygen Induction Type: IV induction Ventilation: Mask ventilation without difficulty and Oral airway inserted - appropriate to patient size Laryngoscope Size: Glidescope and 3 Grade View: Grade I Tube type: Oral Tube size: 7.5 mm Airway Equipment and Method: Stylet Placement Confirmation: ETT inserted through vocal cords under direct vision,  positive ETCO2 and breath sounds checked- equal and bilateral Secured at: 21 cm Tube secured with: Tape Dental Injury: Teeth and Oropharynx as per pre-operative assessment

## 2018-05-10 NOTE — Brief Op Note (Signed)
  NEUROSURGERY BRIEF OPERATIVE NOTE   PREOP DX: Subarachnoid hemorrhage  POSTOP DX: Same  PROCEDURE: Left pterional craniotomy, clipping of anterior communicating artery aneurysm  SURGEON: Kathyrn Sheriff, MD  ASSISTANT: Pool, MD  ANESTHESIA: GETA  EBL: 350cc  SPECIMENS: None  DRAINS: None  COMPLICATIONS: None immediate  CONDITION: Hemodynamically stable to PACU  FINDINGS:  Multilobulated Acom aneurysm correlating with preop angiogram, clipped with single straight clip. ICG angiogram demonstrated no filling of aneurysm and patency of bilateral A1 and visualized left A2.

## 2018-05-10 NOTE — Progress Notes (Signed)
OT Cancellation Note  Patient Details Name: [name] [name] MRN: [id number] DOB: July 22, 1985   Cancelled Treatment:    Reason Eval/Treat Not Completed: Active bedrest order  [place], OT/L   Acute OT Clinical Specialist Acute Rehabilitation Services Pager [phone] Office [phone]  05/10/2018, 7:54 AM

## 2018-05-10 NOTE — Progress Notes (Signed)
PT Cancellation Note  Patient Details Name: Kimble Delaurentis MRN: [id number] DOB: May 28, 1986   Cancelled Treatment:    Reason Eval/Treat Not Completed: Active bedrest order   Sandy Salaam Mabell Esguerra 05/10/2018, 6:49 AM  Elwyn Reach, PT Acute Rehabilitation Services Pager: [phone] Office: [phone]

## 2018-05-10 NOTE — Brief Op Note (Signed)
  NEUROSURGERY BRIEF OPERATIVE NOTE   PREOP DX: Subarachnoid Hemorrhage  POSTOP DX: Same  PROCEDURE: Diagnostic cerebral angiogram  SURGEON: Dr. Consuella Lose, MD  ANESTHESIA: GETA  EBL: Minimal  SPECIMENS: None  COMPLICATIONS: None  CONDITION: Stable to Neuro OR for aneurysm clipping  FINDINGS: 1. Multilobulated Acom aneurysm fills more from the left, not readily amenable to coil embolization 2. No other aneurysms, AVM, or fistulas seen.

## 2018-05-10 NOTE — Anesthesia Procedure Notes (Signed)
Arterial Line Insertion Start/End11/22/2019 3:45 PM, 05/10/2018 4:00 PM Performed by: Shirlyn Goltz, CRNA, CRNA  Patient location: Pre-op. Preanesthetic checklist: patient identified, IV checked, site marked, risks and benefits discussed, surgical consent, monitors and equipment checked, pre-op evaluation, timeout performed and anesthesia consent Lidocaine 1% used for infiltration Left, radial was placed Catheter size: 20 G Hand hygiene performed , maximum sterile barriers used  and Seldinger technique used  Attempts: 1 Procedure performed without using ultrasound guided technique. Following insertion, dressing applied and Biopatch. Post procedure assessment: normal and unchanged  Patient tolerated the procedure well with no immediate complications.

## 2018-05-10 NOTE — Anesthesia Preprocedure Evaluation (Addendum)
Anesthesia Evaluation  Patient identified by MRN, date of birth, ID band Patient awake    Reviewed: Allergy & Precautions, H&P , NPO status , Patient's Chart, lab work & pertinent test results  Airway Mallampati: I  TM Distance: >3 FB Neck ROM: Full    Dental no notable dental hx. (+) Teeth Intact, Dental Advisory Given   Pulmonary asthma , Current Smoker,    Pulmonary exam normal breath sounds clear to auscultation       Cardiovascular hypertension, Pt. on medications  Rhythm:Regular Rate:Normal     Neuro/Psych negative neurological ROS  negative psych ROS   GI/Hepatic Neg liver ROS, GERD  Medicated and Controlled,  Endo/Other  negative endocrine ROS  Renal/GU negative Renal ROS  negative genitourinary   Musculoskeletal   Abdominal   Peds  Hematology negative hematology ROS (+)   Anesthesia Other Findings   Reproductive/Obstetrics negative OB ROS                            Anesthesia Physical Anesthesia Plan  ASA: III  Anesthesia Plan: General   Post-op Pain Management:    Induction: Intravenous  PONV Risk Score and Plan: 3 and Ondansetron, Dexamethasone and Midazolam  Airway Management Planned: Oral ETT  Additional Equipment: Arterial line  Intra-op Plan:   Post-operative Plan: Extubation in OR and Possible Post-op intubation/ventilation  Informed Consent: I have reviewed the patients History and Physical, chart, labs and discussed the procedure including the risks, benefits and alternatives for the proposed anesthesia with the patient or authorized representative who has indicated his/her understanding and acceptance.   Dental advisory given  Plan Discussed with: CRNA  Anesthesia Plan Comments:         Anesthesia Quick Evaluation

## 2018-05-10 NOTE — Sedation Documentation (Signed)
Patient being transferred to OR. Anesthesia and RN will transport

## 2018-05-10 NOTE — Progress Notes (Signed)
Pt transported to short stay, room 33, without any complications. Report given to short stay RN. Pt was hooked up to their monitor. Pts mom at bedside.

## 2018-05-10 NOTE — Progress Notes (Signed)
Subjective: Patient reports feeling a bit better overnight  Objective: Vital signs in last 24 hours: Temp:  [98 F (36.7 C)-98.7 F (37.1 C)] 98.7 F (37.1 C) (11/22 0400) Pulse Rate:  [47-88] 65 (11/22 0715) Resp:  [12-37] 18 (11/22 0715) BP: (106-173)/(44-115) 134/68 (11/22 0715) SpO2:  [96 %-100 %] 97 % (11/22 0715) Weight:  [101 kg] 101 kg (11/21 2145)  Intake/Output from previous day: 11/21 0701 - 11/22 0700 In: 1492.7 [I.V.:492.7; IV Piggyback:1000] Out: 560 [Urine:500] Intake/Output this shift: No intake/output data recorded.  Physical Exam: Meningismus.  Less photophobia.  Lab Results: Recent Labs    05/09/18 1538  WBC 5.5  HGB 13.5  HCT 41.5  PLT 201   BMET Recent Labs    05/09/18 1538  NA 138  K 3.4*  CL 104  CO2 24  GLUCOSE 106*  BUN 6  CREATININE 0.61  CALCIUM 9.4    Studies/Results: Ct Angio Head W Or Wo Contrast  Result Date: 05/09/2018 CLINICAL DATA:  Headache for 3 days radiating to neck and back. Vomiting. EXAM: CT ANGIOGRAPHY HEAD AND NECK TECHNIQUE: Multidetector CT imaging of the head and neck was performed using the standard protocol during bolus administration of intravenous contrast. Multiplanar CT image reconstructions and MIPs were obtained to evaluate the vascular anatomy. Carotid stenosis measurements (when applicable) are obtained utilizing NASCET criteria, using the distal internal carotid diameter as the denominator. CONTRAST:  144mL ISOVUE-370 IOPAMIDOL (ISOVUE-370) INJECTION 76% COMPARISON:  None. FINDINGS: CT HEAD FINDINGS BRAIN: Small volume subarachnoid hemorrhage with dense fourth ventricle blood products with small amount of additional intraventricular hemorrhage. No intraparenchymal hemorrhage, mass effect nor midline shift. The ventricles and sulci are upper limits of normal for age. No acute large vascular territory infarcts. No abnormal extra-axial fluid collections. Basal cisterns are patent. VASCULAR: Unremarkable.  SKULL/SOFT TISSUES: No skull fracture. No significant soft tissue swelling. ORBITS/SINUSES: The included ocular globes and orbital contents are normal.Mild-to-moderate paranasal sinus mucosal thickening. Mastoid air cells are well aerated. OTHER: None. CTA NECK FINDINGS: AORTIC ARCH: Normal appearance of the thoracic arch, 2 vessel arch is a normal variant. The origins of the innominate, left Common carotid artery and subclavian artery are widely patent. RIGHT CAROTID SYSTEM: Common carotid artery is patent. Normal appearance of the carotid bifurcation without hemodynamically significant stenosis by NASCET criteria. Normal appearance of the internal carotid artery. LEFT CAROTID SYSTEM: Common carotid artery is patent. Normal appearance of the carotid bifurcation without hemodynamically significant stenosis by NASCET criteria. Minimal luminal irregularity distal LEFT cervical ICA seen with fibromuscular dysplasia or, artifact. VERTEBRAL ARTERIES:Left vertebral artery is dominant. Normal appearance of the vertebral arteries, widely patent. SKELETON: No acute osseous process though bone windows have not been submitted. Bilateral molar periapical abscesses. OTHER NECK: Soft tissues of the neck are nonacute though, not tailored for evaluation. UPPER CHEST: Included lung apices are clear. No superior mediastinal lymphadenopathy. CTA HEAD FINDINGS: ANTERIOR CIRCULATION: Patent cervical internal carotid arteries, petrous, cavernous and supra clinoid internal carotid arteries. Patent anterior communicating artery. 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 Patent anterior and middle cerebral arteries, mild luminal irregularity. No large vessel occlusion, significant stenosis, contrast extravasation. POSTERIOR CIRCULATION: Patent vertebral arteries, vertebrobasilar junction and basilar artery, as well as main branch vessels. Mildly bulbous basilar tip. Patent posterior  cerebral arteries, mild luminal irregularity. Small RIGHT posterior communicating artery present. No large vessel occlusion, significant stenosis, contrast extravasation or aneurysm. VENOUS SINUSES: Major dural venous  sinuses are patent though not tailored for evaluation on this angiographic examination. ANATOMIC VARIANTS: Partially standing straight of basilar artery. DELAYED PHASE: No abnormal intracranial enhancement. MIP images reviewed. IMPRESSION: CT HEAD: 1. Small volume subarachnoid hemorrhage, likely redistributed into fourth ventricle with early suspected hydrocephalus. CTA NECK: 1. No acute vascular process. 2. No hemodynamically significant stenosis ICA's. Mild LEFT cervical ICA FMD versus artifact. 3. Patent vertebral arteries. CTA HEAD: 1. 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 Neuro-Interventional Radiology consultation is suggested to evaluate the appropriateness of potential treatment. Emergency evaluation can be requested by paging [phone]. 2. Mild luminal irregularity of cerebral arteries concerning for vasospasm. Critical Value/emergent results were called by telephone at the time of interpretation on 05/09/2018 at 7:05 pm to [facility] , who verbally acknowledged these results. Electronically Signed   By: Elon Alas M.D.   On: 05/09/2018 19:17   Ct Angio Neck W And/or Wo Contrast  Result Date: 05/09/2018 CLINICAL DATA:  Headache for 3 days radiating to neck and back. Vomiting. EXAM: CT ANGIOGRAPHY HEAD AND NECK TECHNIQUE: Multidetector CT imaging of the head and neck was performed using the standard protocol during bolus administration of intravenous contrast. Multiplanar CT image reconstructions and MIPs were obtained to evaluate the vascular anatomy. Carotid stenosis measurements (when applicable) are obtained utilizing NASCET criteria, using the distal internal carotid diameter as the denominator. CONTRAST:  168mL ISOVUE-370 IOPAMIDOL (ISOVUE-370) INJECTION 76% COMPARISON:   None. FINDINGS: CT HEAD FINDINGS BRAIN: Small volume subarachnoid hemorrhage with dense fourth ventricle blood products with small amount of additional intraventricular hemorrhage. No intraparenchymal hemorrhage, mass effect nor midline shift. The ventricles and sulci are upper limits of normal for age. No acute large vascular territory infarcts. No abnormal extra-axial fluid collections. Basal cisterns are patent. VASCULAR: Unremarkable. SKULL/SOFT TISSUES: No skull fracture. No significant soft tissue swelling. ORBITS/SINUSES: The included ocular globes and orbital contents are normal.Mild-to-moderate paranasal sinus mucosal thickening. Mastoid air cells are well aerated. OTHER: None. CTA NECK FINDINGS: AORTIC ARCH: Normal appearance of the thoracic arch, 2 vessel arch is a normal variant. The origins of the innominate, left Common carotid artery and subclavian artery are widely patent. RIGHT CAROTID SYSTEM: Common carotid artery is patent. Normal appearance of the carotid bifurcation without hemodynamically significant stenosis by NASCET criteria. Normal appearance of the internal carotid artery. LEFT CAROTID SYSTEM: Common carotid artery is patent. Normal appearance of the carotid bifurcation without hemodynamically significant stenosis by NASCET criteria. Minimal luminal irregularity distal LEFT cervical ICA seen with fibromuscular dysplasia or, artifact. VERTEBRAL ARTERIES:Left vertebral artery is dominant. Normal appearance of the vertebral arteries, widely patent. SKELETON: No acute osseous process though bone windows have not been submitted. Bilateral molar periapical abscesses. OTHER NECK: Soft tissues of the neck are nonacute though, not tailored for evaluation. UPPER CHEST: Included lung apices are clear. No superior mediastinal lymphadenopathy. CTA HEAD FINDINGS: ANTERIOR CIRCULATION: Patent cervical internal carotid arteries, petrous, cavernous and supra clinoid internal carotid arteries. Patent  anterior communicating artery. 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 Patent anterior and middle cerebral arteries, mild luminal irregularity. No large vessel occlusion, significant stenosis, contrast extravasation. POSTERIOR CIRCULATION: Patent vertebral arteries, vertebrobasilar junction and basilar artery, as well as main branch vessels. Mildly bulbous basilar tip. Patent posterior cerebral arteries, mild luminal irregularity. Small RIGHT posterior communicating artery present. No large vessel occlusion, significant stenosis, contrast extravasation or aneurysm. VENOUS SINUSES: Major dural venous sinuses are patent though not tailored  for evaluation on this angiographic examination. ANATOMIC VARIANTS: Partially standing straight of basilar artery. DELAYED PHASE: No abnormal intracranial enhancement. MIP images reviewed. IMPRESSION: CT HEAD: 1. Small volume subarachnoid hemorrhage, likely redistributed into fourth ventricle with early suspected hydrocephalus. CTA NECK: 1. No acute vascular process. 2. No hemodynamically significant stenosis ICA's. Mild LEFT cervical ICA FMD versus artifact. 3. Patent vertebral arteries. CTA HEAD: 1. 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 Neuro-Interventional Radiology consultation is suggested to evaluate the appropriateness of potential treatment. Emergency evaluation can be requested by paging [phone]. 2. Mild luminal irregularity of cerebral arteries concerning for vasospasm. Critical Value/emergent results were called by telephone at the time of interpretation on 05/09/2018 at 7:05 pm to [facility] , who verbally acknowledged these results. Electronically Signed   By: Elon Alas M.D.   On: 05/09/2018 19:17    Assessment/Plan: Patient is doing well.  Ready for angio/coiling/clipping per Dr. Kathyrn Sheriff.    LOS: 1 day    Peggyann Shoals, MD 05/10/2018, 7:48 AM

## 2018-05-11 ENCOUNTER — Inpatient Hospital Stay (HOSPITAL_COMMUNITY): Payer: Medicaid Other

## 2018-05-11 DIAGNOSIS — I671 Cerebral aneurysm, nonruptured: Secondary | ICD-10-CM

## 2018-05-11 DIAGNOSIS — Z978 Presence of other specified devices: Secondary | ICD-10-CM

## 2018-05-11 DIAGNOSIS — I602 Nontraumatic subarachnoid hemorrhage from anterior communicating artery: Secondary | ICD-10-CM

## 2018-05-11 DIAGNOSIS — Z8679 Personal history of other diseases of the circulatory system: Secondary | ICD-10-CM

## 2018-05-11 LAB — BASIC METABOLIC PANEL
Anion gap: 7 (ref 5–15)
BUN: <5 mg/dL — ABNORMAL LOW (ref 6–20)
CALCIUM: 8.3 mg/dL — AB (ref 8.9–10.3)
CO2: 20 mmol/L — AB (ref 22–32)
CREATININE: 0.7 mg/dL (ref 0.44–1.00)
Chloride: 111 mmol/L (ref 98–111)
GFR calc Af Amer: >60 mL/min (ref >60)
Glucose, Bld: 147 mg/dL — ABNORMAL HIGH (ref 70–99)
Potassium: 3.1 mmol/L — ABNORMAL LOW (ref 3.5–5.1)
Sodium: 138 mmol/L (ref 135–145)

## 2018-05-11 LAB — CBC WITH DIFFERENTIAL/PLATELET
Abs Immature Granulocytes: 0.02 10*3/uL (ref 0.00–0.07)
BASOS ABS: 0 10*3/uL (ref 0.0–0.1)
Basophils Relative: 0 %
EOS ABS: 0.2 10*3/uL (ref 0.0–0.5)
EOS PCT: 2 %
HCT: 38.4 % (ref 36.0–46.0)
HEMOGLOBIN: 12.9 g/dL (ref 12.0–15.0)
Immature Granulocytes: 0 %
LYMPHS ABS: 0.7 10*3/uL (ref 0.7–4.0)
Lymphocytes Relative: 7 %
MCH: 32.6 pg (ref 26.0–34.0)
MCHC: 33.6 g/dL (ref 30.0–36.0)
MCV: 97 fL (ref 80.0–100.0)
MONOS PCT: 3 %
Monocytes Absolute: 0.3 10*3/uL (ref 0.1–1.0)
NRBC: 0 % (ref 0.0–0.2)
Neutro Abs: 8.9 10*3/uL — ABNORMAL HIGH (ref 1.7–7.7)
Neutrophils Relative %: 88 %
PLATELETS: 192 10*3/uL (ref 150–400)
RBC: 3.96 MIL/uL (ref 3.87–5.11)
RDW: 13.1 % (ref 11.5–15.5)
Smear Review: ADEQUATE
WBC: 10.2 10*3/uL (ref 4.0–10.5)

## 2018-05-11 LAB — HIV ANTIBODY (ROUTINE TESTING W REFLEX): HIV Screen 4th Generation wRfx: NONREACTIVE

## 2018-05-11 LAB — MAGNESIUM: MAGNESIUM: 1.7 mg/dL (ref 1.7–2.4)

## 2018-05-11 LAB — PHOSPHORUS: Phosphorus: 2.3 mg/dL — ABNORMAL LOW (ref 2.5–4.6)

## 2018-05-11 LAB — TRIGLYCERIDES: TRIGLYCERIDES: 228 mg/dL — AB (ref <150)

## 2018-05-11 MED ORDER — CHLORHEXIDINE GLUCONATE 0.12% ORAL RINSE (MEDLINE KIT)
15.0000 mL | Freq: Two times a day (BID) | OROMUCOSAL | Status: DC
  Administered 2018-05-11 – 2018-05-18 (×15): 15 mL via OROMUCOSAL

## 2018-05-11 MED ORDER — SODIUM CHLORIDE 0.9 % IV SOLN: INTRAVENOUS | Status: DC | PRN

## 2018-05-11 MED ORDER — FENTANYL 2500MCG IN NS 250ML (10MCG/ML) PREMIX INFUSION
25.0000 ug/h | INTRAVENOUS | Status: DC
  Administered 2018-05-11 – 2018-05-13 (×2): 50 ug/h via INTRAVENOUS
  Filled 2018-05-11 (×4): qty 250

## 2018-05-11 MED ORDER — SODIUM CHLORIDE 0.9 % IV BOLUS: 1000.0000 mL | Freq: Once | INTRAVENOUS | Status: AC

## 2018-05-11 MED ORDER — POTASSIUM CHLORIDE 20 MEQ PO PACK
40.0000 meq | PACK | ORAL | Status: AC
  Administered 2018-05-11 (×2): 40 meq via ORAL
  Filled 2018-05-11 (×2): qty 2

## 2018-05-11 MED ORDER — FUROSEMIDE 10 MG/ML IJ SOLN: 40.0000 mg | Freq: Once | INTRAMUSCULAR | Status: DC

## 2018-05-11 MED ORDER — ORAL CARE MOUTH RINSE
15.0000 mL | OROMUCOSAL | Status: DC
  Administered 2018-05-11 – 2018-05-18 (×74): 15 mL via OROMUCOSAL

## 2018-05-11 MED ORDER — FENTANYL BOLUS VIA INFUSION
50.0000 ug | INTRAVENOUS | Status: DC | PRN
  Administered 2018-05-11 – 2018-05-13 (×5): 50 ug via INTRAVENOUS
  Filled 2018-05-11: qty 50

## 2018-05-11 MED ORDER — LORATADINE 10 MG PO TABS
10.0000 mg | ORAL_TABLET | Freq: Every day | ORAL | Status: DC
  Administered 2018-05-12 – 2018-05-20 (×6): 10 mg
  Filled 2018-05-11 (×6): qty 1

## 2018-05-11 MED ORDER — SODIUM CHLORIDE 0.9 % IV BOLUS
1000.0000 mL | Freq: Once | INTRAVENOUS | Status: AC
  Administered 2018-05-11: 1000 mL via INTRAVENOUS

## 2018-05-11 MED ORDER — SODIUM CHLORIDE 0.9 % IV BOLUS
500.0000 mL | Freq: Once | INTRAVENOUS | Status: AC
  Administered 2018-05-11: 500 mL via INTRAVENOUS

## 2018-05-11 MED ORDER — MONTELUKAST SODIUM 10 MG PO TABS
10.0000 mg | ORAL_TABLET | Freq: Every day | ORAL | Status: DC
  Administered 2018-05-11 – 2018-05-20 (×10): 10 mg
  Filled 2018-05-11 (×10): qty 1

## 2018-05-11 NOTE — Progress Notes (Addendum)
NAME:  [name] [name], MRN:  [id number], DOB:  Mar 08, 1986, LOS: 2 ADMISSION DATE:  05/09/2018, CONSULTATION DATE:  05/10/2018 REFERRING MD:  Dr. Vertell Limber, CHIEF COMPLAINT:  Post op vent, Clipping   Brief History   32 year old female admitted 11/21 with SAH and taken to OR for clipping 11/22, but remained on the ventilator in the post-operative setting.   History of present illness   Patient is encephalopathic and/or intubated. Therefore history has been obtained from chart review.  32 year old female with past medical history as below, which is significant for hypertension.  She presented to the emergency department on 11/21 with complaints of frontal headache x3 days.  She complained of associated bilateral neck pain and tightness that radiated down to her entire back along with associated nausea and vomiting.  Relief of symptoms in the fetal position.  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  The treatment plan was initially for endovascular repair of the aneurysm, however, on diagnostic cerebral angiogram the aneurysm was not readily amenable for embolization.  On 11/22 she was taken to the OR under Dr. Kathyrn Sheriff for left pterional craniotomy and clipping of the aneurysm.  Postoperatively she remained on the ventilator and was transferred to the intensive care unit for recovery.  Pulmonary critical care medicine was asked to consult for ventilator management.   Past Medical History   has a past medical history of Asthma, allergic, Bilateral thoracic back pain (09/13/2015), Eczema, Hypertension, Lactose intolerance, Mature cystic teratoma of ovary, Mood swings (06/01/2015), and Seasonal allergies.  Significant Hospital Events   11/21 admit with [facility] 11/22 to OR for clipping of A. Com. Aneurysm.   Consults:    Procedures:  11/21 craniotomy, clipping of A.  Com aneurysm  Significant Diagnostic Tests:  CT head 11/21 > Small volume subarachnoid hemorrhage, likely redistributed into fourth ventricle with early suspected hydrocephalus. 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 Mild luminal irregularity of cerebral arteries concerning for vasospasm. CT Head 11/23 >> lateral ventricles decreased in size, no new bleeding, small amount of residual pneumocephalus.  Small amount of hyperdensity around the left caudate head noted by Dr. Annette Stable, concerning for possible infarct  Micro Data:    Antimicrobials:    Interim history/subjective:     Objective   Blood pressure (!) 149/72, pulse 95, temperature 99.9 F (37.7 C), resp. rate 19, height 5\' 10"  (1.778 m), weight 101 kg, last menstrual period 05/03/2018, SpO2 100 %, currently breastfeeding.    Vent Mode: PRVC FiO2 (%):  [30 %-60 %] 30 % Set Rate:  [18 bmp] 18 bmp Vt Set:  [540 mL] 540 mL PEEP:  [5 cmH20] 5 cmH20 Plateau Pressure:  [16 cmH20-17 cmH20] 16 cmH20   Intake/Output Summary (Last 24 hours) at 05/11/2018 1110 Last data filed at 05/11/2018 1100 Gross per 24 hour  Intake 5172.33 ml  Output 4830 ml  Net 342.33 ml   Filed Weights   05/09/18 2145 05/10/18 1511  Weight: 101 kg 101 kg    Examination: General: Obese woman, ventilated HENT: Dressing in place, clean, pupils equal ET tube in good position Lungs: Clear bilaterally Cardiovascular: Tachycardic, regular, no murmur Abdomen: Soft, nondistended, positive bowel sounds Neuro: She does turn to voice, stimulation, spontaneously moving her extremities.  Does not follow commands on propofol   Resolved Hospital Problem list     Assessment &  Plan:   Subarachnoid hemorrhage: secondary to ruptured A. Com. Aneurysm s/p clipping 11/22.  Question left caudate infarct Follow-up imaging as per neurosurgery plans Increase IV fluids and liberalize blood pressure goals to enhance perfusion keppra given x 1, now off  Acute  respiratory failure due to encephalopathy, SAH Okay for spontaneous breathing but no plans for extubation given current mental status Continue to push SBT  VAP prevention orders  Acute encephalopathy due to [facility], sedation, ? Possible L caudate infarct Continue propofol for sedation and wean as able, add low dose fentanyl gtt, plan wean next 24h, reassess MS  Hypertension Home amlodipine, Cleviprex as above  Hypokalemia Replace as indicated Follow BMP  Allergic rhinitis singulair and loratadine ordered.     Best practice:  Diet: NPO Pain/Anxiety/Delirium protocol (if indicated): Propofol infusion. PRNs. RASS goal -1 to -2.  VAP protocol (if indicated): Per protocol DVT prophylaxis: none GI prophylaxis: protonix Glucose control:  Mobility: Bed rest Code Status: FULL Family Communication:  No family at bedside 11/23 Disposition: ICU  Labs   CBC: Recent Labs  Lab 05/09/18 1538 05/10/18 0001  WBC 5.5 10.2  NEUTROABS 3.1 8.9*  HGB 13.5 12.9  HCT 41.5 38.4  MCV 96.7 97.0  PLT 201 889    Basic Metabolic Panel: Recent Labs  Lab 05/09/18 1538 05/10/18 0001  NA 138 138  K 3.4* 3.1*  CL 104 111  CO2 24 20*  GLUCOSE 106* 147*  BUN 6 <5*  CREATININE 0.61 0.70  CALCIUM 9.4 8.3*  MG  --  1.7  PHOS  --  2.3*   GFR: Estimated Creatinine Clearance: 129.9 mL/min (by C-G formula based on SCr of 0.7 mg/dL). Recent Labs  Lab 05/09/18 1538 05/10/18 0001  WBC 5.5 10.2    Liver Function Tests: Recent Labs  Lab 05/09/18 1948  AST 22  ALT 24  ALKPHOS 52  BILITOT 0.8  PROT 6.7  ALBUMIN 3.5   No results for input(s): LIPASE, AMYLASE in the last 168 hours. No results for input(s): AMMONIA in the last 168 hours.  ABG    Component Value Date/Time   PHART 7.332 (L) 05/10/2018 2315   PCO2ART 41.8 05/10/2018 2315   PO2ART 144.0 (H) 05/10/2018 2315   HCO3 22.2 05/10/2018 2315   TCO2 23 05/10/2018 2315   ACIDBASEDEF 4.0 (H) 05/10/2018 2315   O2SAT 99.0  05/10/2018 2315     Coagulation Profile: Recent Labs  Lab 05/09/18 1948  INR 1.00    Cardiac Enzymes: No results for input(s): CKTOTAL, CKMB, CKMBINDEX, TROPONINI in the last 168 hours.  CBG: No results for input(s): GLUCAP in the last 168 hours.    Critical care time: 75 min      Baltazar Apo, MD, PhD 05/11/2018, 11:28 AM [facility] [phone] or if no answer [phone]

## 2018-05-11 NOTE — Progress Notes (Signed)
PCCM Interval Note  Notified that pt had had minimal UOP during this shift. She is 3+L positive for the hospitalization. Note Dr Marchelle Folks plan to push IVF given her possible L caudate infarct. Will hold off on lasix, give IVF bolus and follow.   Baltazar Apo, MD, PhD 05/11/2018, 5:03 PM [facility] [phone] or if no answer [phone]

## 2018-05-11 NOTE — Progress Notes (Signed)
Neurosurgery on call notified that patient is not moving right side much. CT head STAT order placed. Will continue to monitor patient.

## 2018-05-11 NOTE — Progress Notes (Signed)
Postop day 1.  Patient intubated on ventilator.  Afebrile.  Tachycardic.  Blood pressure controlled.  Urine output good.  Fluid balance positive.  Patient awakens to loud voice.  She does not appear to be particularly aware.  She does not follow commands.  Patient strongly moves left upper and left lower extremity.  Weakness on the right side.  Patient minimally flexes right upper extremity.  She does withdraw her right lower extremity.  Her dressing is dry.  Follow-up head CT scan this morning demonstrates no evidence of further hemorrhage.  No evidence of hydrocephalus.  There is a small area of hypodensity around the left caudate head worrisome for infarct.  Status post left pterional craniotomy and clipping of aneurysm.  Patient with postoperative right-sided weakness worrisome for [place]ub[place] artery infarct.  Push fluid and blood pressure more aggressively.  No indication for any different treatment otherwise.

## 2018-05-11 NOTE — Progress Notes (Signed)
SLP Cancellation Note  Patient Details Name: [name] [name] MRN: [id number] DOB: 09-May-1986   Cancelled treatment:       Reason Eval/Treat Not Completed: Medical issues which prohibited therapy   [name] 05/11/2018, 9:29 AM

## 2018-05-11 NOTE — Progress Notes (Signed)
Initial Nutrition Assessment  DOCUMENTATION CODES:   Obesity unspecified  INTERVENTION:   -If pt unable to extubate within 48 hours, recommend:  Initiate TF via OGT with Vital High Protein at goal rate of 5 ml/h (120 ml per day) and 30 ml Prostat 9 times daily to provide 1020 kcals, 146 gm protein, 100 ml free water daily.  TF plus current infusing of propofol and cleviprex providing 2861 kcals daily. Unable to meet minimum protein needs without exceeding estimated calorie needs due to high rate of propofol and cleviprex.   NUTRITION DIAGNOSIS:   Inadequate oral intake related to inability to eat as evidenced by NPO status.  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  Plan is admission with endovascular treatment of this aneurysm.  11/22- s/p PROCEDURE: Diagnostic cerebral angiogram, which revealed multilobulated Acom aneurysm fills more from the left, not readily amenable to coil embolization; Left pterional craniotomy, clipping of anterior communicating artery aneurysm  Patient is currently intubated on ventilator support. MV: 9.4 L/min Temp (24hrs), Avg:100.1 F (37.8 C), Min:98.3 F (36.8 C), Max:101.4 F (38.6 C)  Propofol: 15.2 ml/hr (401 kcals/daily) Cleviprex: 30 ml/hr (1440 kcals/daily)  Case discussed with RN, who reports no current plans for TF or extubation today, however, MD has not rounded yet; RN reports plan to ask MD about starting TF if no plans to extubate.   Head CT pending, as pt is minimally moving rt side of body. RN also reports pt with difficulty waking up.   Labs reviewed.   NUTRITION - FOCUSED PHYSICAL EXAM:    Most Recent Value  Orbital Region  No depletion  Upper Arm Region  No depletion  Thoracic and Lumbar  Region  No depletion  Buccal Region  No depletion  Temple Region  No depletion  Clavicle Bone Region  No depletion  Clavicle and Acromion Bone Region  No depletion  Scapular Bone Region  No depletion  Dorsal Hand  No depletion  Patellar Region  No depletion  Anterior Thigh Region  No depletion  Edema (RD Assessment)  None  Hair  Reviewed  Eyes  Reviewed  Mouth  Reviewed  Skin  Reviewed  Nails  Reviewed       Diet Order:   Diet Order            Diet NPO time specified  Diet effective now              EDUCATION NEEDS:   Not appropriate for education at this time  Skin:  Skin Assessment: Skin Integrity Issues: Skin Integrity Issues:: Incisions Incisions: closed head  Last BM:  05/09/18  Height:   Ht Readings from Last 1 Encounters:  05/10/18 5\' 10"  (1.778 m)    Weight:   Wt Readings from Last 1 Encounters:  05/10/18 101 kg    Ideal Body Weight:  68.2 kg  BMI:  Body mass index is 31.95 kg/m.  Estimated Nutritional Needs:   Kcal:  4270-6237  Protein:  > 136 grams  Fluid:  > 1.2 L    Nhyira Leano A. Jimmye Norman, RD, LDN, CDE Pager: [phone] After hours Pager: [phone]

## 2018-05-11 NOTE — Progress Notes (Signed)
Bilateral breasts pumped, 0 mLs from left breast,  45 mLs from right breast. Discarded.

## 2018-05-11 NOTE — Progress Notes (Signed)
PT Cancellation Note  Patient Details Name: Arlenis Blaydes MRN: [id number] DOB: 1986-01-27   Cancelled Treatment:    Reason Eval/Treat Not Completed: Medical issues which prohibited therapy(pt remains intubated on bedrest)   Curtistine Pettitt B Nixxon Faria 05/11/2018, 6:57 AM  Elwyn Reach, PT Acute Rehabilitation Services Pager: [phone] Office: [phone]

## 2018-05-11 NOTE — Progress Notes (Signed)
Per Dr. Annette Stable, new SBP parameters 100-160 via art line

## 2018-05-11 NOTE — Progress Notes (Signed)
NIH increased due to patient being sedated post OR. Will continue to assess.

## 2018-05-12 ENCOUNTER — Other Ambulatory Visit: Payer: Self-pay | Admitting: Diagnostic Radiology

## 2018-05-12 DIAGNOSIS — J9601 Acute respiratory failure with hypoxia: Secondary | ICD-10-CM

## 2018-05-12 DIAGNOSIS — I602 Nontraumatic subarachnoid hemorrhage from anterior communicating artery: Principal | ICD-10-CM

## 2018-05-12 LAB — BPAM RBC
Blood Product Expiration Date: 201911292359
Blood Product Expiration Date: 201911292359
ISSUE DATE / TIME: 201911221907
ISSUE DATE / TIME: 201911221907
UNIT TYPE AND RH: 7300
Unit Type and Rh: 7300

## 2018-05-12 LAB — TYPE AND SCREEN
ABO/RH(D): B POS
ANTIBODY SCREEN: NEGATIVE
Unit division: 0
Unit division: 0

## 2018-05-12 LAB — CBC
HCT: 34.3 % — ABNORMAL LOW (ref 36.0–46.0)
Hemoglobin: 11.1 g/dL — ABNORMAL LOW (ref 12.0–15.0)
MCH: 31.1 pg (ref 26.0–34.0)
MCHC: 32.4 g/dL (ref 30.0–36.0)
MCV: 96.1 fL (ref 80.0–100.0)
PLATELETS: 151 10*3/uL (ref 150–400)
RBC: 3.57 MIL/uL — ABNORMAL LOW (ref 3.87–5.11)
RDW: 13.5 % (ref 11.5–15.5)
WBC: 11.7 10*3/uL — AB (ref 4.0–10.5)
nRBC: 0 % (ref 0.0–0.2)

## 2018-05-12 LAB — BASIC METABOLIC PANEL
Anion gap: 10 (ref 5–15)
CO2: 20 mmol/L — ABNORMAL LOW (ref 22–32)
Calcium: 8.2 mg/dL — ABNORMAL LOW (ref 8.9–10.3)
Chloride: 107 mmol/L (ref 98–111)
Creatinine, Ser: 0.56 mg/dL (ref 0.44–1.00)
GFR calc Af Amer: >60 mL/min (ref >60)
Glucose, Bld: 108 mg/dL — ABNORMAL HIGH (ref 70–99)
Potassium: 3.7 mmol/L (ref 3.5–5.1)
SODIUM: 137 mmol/L (ref 135–145)

## 2018-05-12 NOTE — Progress Notes (Signed)
Bilateral breasts pumped x 15 minutes. Right breast 30 ml output, discarded. Left breast 0 ml output.

## 2018-05-12 NOTE — Progress Notes (Signed)
Postop day 2.  Patient remains on ventilator.  Patient remains very somnolent.  No new events or problems overnight.    Patient is afebrile.  Heart rate is stable in the mid 70s.  Blood pressure acceptable in the 1 40-1 60 range.  Urine output good.  Fluid status significantly positive.  Patient will weakly open her eyes to noxious stimuli.  She has minimal appearance of awareness.  Her pupils are equal at 4 mm bilaterally.  Gaze is conjugate.  Corneal reflexes are present.  Patient with strong purposeful movement of both her left upper and lower extremity.  Patient with minimal flexion in the right upper extremity and right lower extremity to pain.  Status post left-sided craniotomy and clipping of a, aneurysm.  Patient with significant postoperative weakness on the right side likely secondary to perforating artery spasm and/or stroke.  Continue efforts at hyperdynamic therapy.  Plan for follow-up head CT scan in morning.

## 2018-05-12 NOTE — Anesthesia Postprocedure Evaluation (Signed)
Anesthesia Post Note  Patient: [facility]  Procedure(s) Performed: EMBOLIZATION OF ANEURSYM (N/A )     Patient location during evaluation: SICU Anesthesia Type: General Level of consciousness: sedated Pain management: pain level controlled Vital Signs Assessment: post-procedure vital signs reviewed and stable Respiratory status: patient remains intubated per anesthesia plan Cardiovascular status: stable Postop Assessment: no apparent nausea or vomiting Anesthetic complications: no    Last Vitals:  Vitals:   05/12/18 1800 05/12/18 1900  BP: (!) 146/90 (!) 144/91  Pulse: (!) 107 (!) 103  Resp: 16 12  Temp:    SpO2: 100% 100%    Last Pain:  Vitals:   05/12/18 1600  TempSrc: Axillary  PainSc:                  Jhoana Upham

## 2018-05-12 NOTE — Progress Notes (Signed)
SLP Cancellation Note  Patient Details Name: Charlene Detter MRN: [id number] DOB: Mar 31, 1986   Cancelled treatment:       Reason Eval/Treat Not Completed: Medical issues which prohibited therapy  Deneise Lever, [place], [place] Pathologist Acute Rehabilitation Services Pager: [phone] Office: [phone]   Aliene Altes 05/12/2018, 8:12 AM

## 2018-05-12 NOTE — Progress Notes (Signed)
PT Cancellation Note  Patient Details Name: Taylen Wendland MRN: [id number] DOB: [birth year]   Cancelled Treatment:    Reason Eval/Treat Not Completed: Patient not medically ready. Pt remains intubated, sedated, with active bedrest order. Acute PT to return as able, as appropriate to complete PT eval.   Kittie Plater, PT, DPT Acute Rehabilitation Services Pager #: [phone] Office #: [phone]    Berline Lopes 05/12/2018, 9:56 AM

## 2018-05-12 NOTE — Progress Notes (Signed)
NAME:  [name] [name], MRN:  [id number], DOB:  Jan 14, 1986, LOS: 3 ADMISSION DATE:  05/09/2018, CONSULTATION DATE:  05/10/2018 REFERRING MD:  Dr. Vertell Limber, CHIEF COMPLAINT:  Post op vent, Clipping   Brief History   32 year old female admitted 11/21 with SAH and taken to OR for clipping 11/22, but remained on the ventilator in the post-operative setting.   History of present illness   Patient is encephalopathic and/or intubated. Therefore history has been obtained from chart review.  32 year old female with past medical history as below, which is significant for hypertension.  She presented to the emergency department on 11/21 with complaints of frontal headache x3 days.  She complained of associated bilateral neck pain and tightness that radiated down to her entire back along with associated nausea and vomiting.  Relief of symptoms in the fetal position.  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  The treatment plan was initially for endovascular repair of the aneurysm, however, on diagnostic cerebral angiogram the aneurysm was not readily amenable for embolization.  On 11/22 she was taken to the OR under Dr. Kathyrn Sheriff for left pterional craniotomy and clipping of the aneurysm.  Postoperatively she remained on the ventilator and was transferred to the intensive care unit for recovery.  Pulmonary critical care medicine was asked to consult for ventilator management.   Past Medical History   has a past medical history of Asthma, allergic, Bilateral thoracic back pain (09/13/2015), Eczema, Hypertension, Lactose intolerance, Mature cystic teratoma of ovary, Mood swings (06/01/2015), and Seasonal allergies.  Significant Hospital Events   11/21 admit with [facility] 11/22 to OR for clipping of A. Com. Aneurysm.   Consults:    Procedures:  11/21 craniotomy, clipping of A.  Com aneurysm  Significant Diagnostic Tests:  CT head 11/21 > Small volume subarachnoid hemorrhage, likely redistributed into fourth ventricle with early suspected hydrocephalus. 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 Mild luminal irregularity of cerebral arteries concerning for vasospasm. CT Head 11/23 >> lateral ventricles decreased in size, no new bleeding, small amount of residual pneumocephalus.  Small amount of hyperdensity around the left caudate head noted by Dr. Annette Stable, concerning for possible infarct  Micro Data:    Antimicrobials:    Interim history/subjective:  She had some decreased urine output 11/23, responded to IV fluid bolus Remains weak on the right, no significant change   Objective   Blood pressure (!) 143/86, pulse 76, temperature (!) 100.6 F (38.1 C), temperature source Axillary, resp. rate 13, height 5\' 10"  (1.778 m), weight 101 kg, last menstrual period 05/03/2018, SpO2 100 %, currently breastfeeding.    Vent Mode: CPAP;PSV FiO2 (%):  [30 %] 30 % Set Rate:  [18 bmp] 18 bmp Vt Set:  [540 mL] 540 mL PEEP:  [5 cmH20] 5 cmH20 Pressure Support:  [15 cmH20] 15 cmH20 Plateau Pressure:  [16 cmH20-18 cmH20] 16 cmH20   Intake/Output Summary (Last 24 hours) at 05/12/2018 1247 Last data filed at 05/12/2018 1200 Gross per 24 hour  Intake 3888.49 ml  Output 2770 ml  Net 1118.49 ml   Filed Weights   05/09/18 2145 05/10/18 1511  Weight: 101 kg 101 kg    Examination: General: Obese woman, ill-appearing, ventilated HENT: Cranial dressing in place, clean and dry, pupils equal, ET tube in place Lungs: Clear bilaterally, no wheezing Cardiovascular: Regular, tachycardic, no murmur Abdomen: Soft,  nondistended, positive bowel sounds Neuro: She turns to voice and stimulation, spontaneously moves on the left, I did not see any movement on the right   Resolved Hospital Problem list     Assessment & Plan:   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  Question left caudate infarct; at risk of vasospasm On nimodipine Liberalize blood pressure goals to 706 systolic, liberalize IV fluids Repeat head CT 11/25 per neurosurgery plans Keppra given x1, discontinued  Acute respiratory failure due to encephalopathy, SAH Okay for spontaneous breathing trials, no plans to extubate until mental status improves.  Currently tolerating PSV 15 VAP prevention orders  Acute encephalopathy due to [facility], sedation, ? Possible L caudate infarct Wean sedating medications as able  Hypertension Home amlodipine Cleviprex as above Nimodipine  Hypokalemia Replace as indicated Follow BMP  Allergic rhinitis Singulair, loratadine ordered    Best practice:  Diet: NPO Pain/Anxiety/Delirium protocol (if indicated): Propofol infusion. PRNs. RASS goal -1  VAP protocol (if indicated): Per protocol DVT prophylaxis: none GI prophylaxis: protonix Glucose control:  Mobility: Bed rest Code Status: FULL Family Communication: Discussed with patient's family at bedside 11/24 Disposition: ICU  Labs   CBC: Recent Labs  Lab 05/09/18 1538 05/10/18 0001  WBC 5.5 10.2  NEUTROABS 3.1 8.9*  HGB 13.5 12.9  HCT 41.5 38.4  MCV 96.7 97.0  PLT 201 237    Basic Metabolic Panel: Recent Labs  Lab 05/09/18 1538 05/10/18 0001  NA 138 138  K 3.4* 3.1*  CL 104 111  CO2 24 20*  GLUCOSE 106* 147*  BUN 6 <5*  CREATININE 0.61 0.70  CALCIUM 9.4 8.3*  MG  --  1.7  PHOS  --  2.3*   GFR: Estimated Creatinine Clearance: 129.9 mL/min (by C-G formula based on SCr of 0.7 mg/dL). Recent Labs  Lab 05/09/18 1538 05/10/18 0001  WBC 5.5 10.2    Liver Function Tests: Recent Labs  Lab 05/09/18 1948  AST 22  ALT 24  ALKPHOS 52  BILITOT 0.8  PROT 6.7  ALBUMIN 3.5   No results for input(s): LIPASE, AMYLASE in the last 168 hours. No results for input(s): AMMONIA in the last 168 hours.  ABG    Component Value Date/Time    PHART 7.332 (L) 05/10/2018 2315   PCO2ART 41.8 05/10/2018 2315   PO2ART 144.0 (H) 05/10/2018 2315   HCO3 22.2 05/10/2018 2315   TCO2 23 05/10/2018 2315   ACIDBASEDEF 4.0 (H) 05/10/2018 2315   O2SAT 99.0 05/10/2018 2315     Coagulation Profile: Recent Labs  Lab 05/09/18 1948  INR 1.00    Cardiac Enzymes: No results for input(s): CKTOTAL, CKMB, CKMBINDEX, TROPONINI in the last 168 hours.  CBG: No results for input(s): GLUCAP in the last 168 hours.    Critical care time: 62 min      Baltazar Apo, MD, PhD 05/12/2018, 12:47 PM [facility] [phone] or if no answer [phone]

## 2018-05-13 ENCOUNTER — Encounter (HOSPITAL_COMMUNITY): Payer: Self-pay | Admitting: Neurosurgery

## 2018-05-13 ENCOUNTER — Inpatient Hospital Stay (HOSPITAL_COMMUNITY): Payer: Medicaid Other

## 2018-05-13 LAB — BASIC METABOLIC PANEL
ANION GAP: 6 (ref 5–15)
BUN: 7 mg/dL (ref 6–20)
CALCIUM: 8.4 mg/dL — AB (ref 8.9–10.3)
CO2: 24 mmol/L (ref 22–32)
Chloride: 111 mmol/L (ref 98–111)
Creatinine, Ser: 0.61 mg/dL (ref 0.44–1.00)
GFR calc Af Amer: >60 mL/min (ref >60)
GFR calc non Af Amer: >60 mL/min (ref >60)
Glucose, Bld: 106 mg/dL — ABNORMAL HIGH (ref 70–99)
Potassium: 3.3 mmol/L — ABNORMAL LOW (ref 3.5–5.1)
Sodium: 141 mmol/L (ref 135–145)

## 2018-05-13 LAB — GLUCOSE, CAPILLARY
GLUCOSE-CAPILLARY: 88 mg/dL (ref 70–99)
Glucose-Capillary: 105 mg/dL — ABNORMAL HIGH (ref 70–99)
Glucose-Capillary: 112 mg/dL — ABNORMAL HIGH (ref 70–99)
Glucose-Capillary: 114 mg/dL — ABNORMAL HIGH (ref 70–99)

## 2018-05-13 LAB — MAGNESIUM
MAGNESIUM: 1.8 mg/dL (ref 1.7–2.4)
MAGNESIUM: 1.9 mg/dL (ref 1.7–2.4)

## 2018-05-13 LAB — TRIGLYCERIDES: Triglycerides: 133 mg/dL (ref <150)

## 2018-05-13 LAB — PHOSPHORUS
Phosphorus: 3.2 mg/dL (ref 2.5–4.6)
Phosphorus: 3.3 mg/dL (ref 2.5–4.6)

## 2018-05-13 MED ORDER — POTASSIUM CHLORIDE 20 MEQ/15ML (10%) PO SOLN
40.0000 meq | Freq: Once | ORAL | Status: AC
  Administered 2018-05-13: 40 meq
  Filled 2018-05-13: qty 30

## 2018-05-13 MED ORDER — ADULT MULTIVITAMIN LIQUID CH
15.0000 mL | Freq: Every day | ORAL | Status: DC
  Administered 2018-05-13 – 2018-05-20 (×8): 15 mL
  Filled 2018-05-13 (×8): qty 15

## 2018-05-13 MED ORDER — PRO-STAT SUGAR FREE PO LIQD
60.0000 mL | Freq: Three times a day (TID) | ORAL | Status: DC
  Administered 2018-05-13 – 2018-05-15 (×6): 60 mL
  Filled 2018-05-13 (×6): qty 60

## 2018-05-13 MED ORDER — VITAL HIGH PROTEIN PO LIQD
1000.0000 mL | ORAL | Status: DC
  Administered 2018-05-14: 1000 mL

## 2018-05-13 MED ORDER — PRO-STAT SUGAR FREE PO LIQD
30.0000 mL | Freq: Two times a day (BID) | ORAL | Status: DC
  Administered 2018-05-13: 30 mL
  Filled 2018-05-13: qty 30

## 2018-05-13 MED ORDER — AMLODIPINE BESYLATE 10 MG PO TABS
10.0000 mg | ORAL_TABLET | Freq: Every day | ORAL | Status: DC
  Administered 2018-05-13 – 2018-05-20 (×8): 10 mg
  Filled 2018-05-13 (×8): qty 1

## 2018-05-13 MED ORDER — PANTOPRAZOLE SODIUM 40 MG PO PACK
40.0000 mg | PACK | ORAL | Status: DC
  Administered 2018-05-13 – 2018-05-16 (×4): 40 mg
  Filled 2018-05-13 (×4): qty 20

## 2018-05-13 MED ORDER — VITAL HIGH PROTEIN PO LIQD
1000.0000 mL | ORAL | Status: DC
  Administered 2018-05-13: 1000 mL

## 2018-05-13 MED FILL — Thrombin (Recombinant) For Soln 20000 Unit: CUTANEOUS | Qty: 1 | Status: AC

## 2018-05-13 NOTE — Progress Notes (Signed)
  NEUROSURGERY PROGRESS NOTE   No issues overnight.  Remains intubated and sedated.   EXAM:  BP (!) 144/100 (BP Location: Left Arm)   Pulse 62   Temp 98 F (36.7 C) (Axillary)   Resp 18   Ht 5\' 10"  (1.778 m)   Wt 101 kg   LMP 05/03/2018   SpO2 100%   BMI 31.95 kg/m   Sedation lifted for exam Intubated PERRL ?squeeze with left hand.  W/D LUE and LLE to pain. Does flex right wrist to painful stimulus.   IMPRESSION/PLAN 32 y.o. female s/p clipping ACOM aneurysm, now with new ACA infarct. Remains intubated and sedated.  - Continue supportive care - SBP <180

## 2018-05-13 NOTE — Progress Notes (Signed)
Nutrition Follow-up  DOCUMENTATION CODES:   Obesity unspecified  INTERVENTION:   Decrease Vital High Protein to 30 ml/hr (720 ml/day) Increase Prostat to 60 ml Prostat TID Add MVI  Provides: 1320 kcal, 153 grams protein, and 601 ml free water. TF regimen and propofol at current rate providing 1481 total kcal/day    NUTRITION DIAGNOSIS:   Inadequate oral intake related to inability to eat as evidenced by NPO status. Ongoing.   GOAL:   Provide needs based on ASPEN/SCCM guidelines Progressing.   MONITOR:   Vent status, Labs, Weight trends, Skin, I & O's  REASON FOR ASSESSMENT:   Consult Enteral/tube feeding initiation and management  ASSESSMENT:   Pt with PMH of HTN admitted 11/21 with SAH s/p clipping 11/22.   Pt discussed during ICU rounds and with RN.   Patient is currently intubated on ventilator support Temp (24hrs), Avg:100.3 F (37.9 C), Min:98 F (36.7 C), Max:102.1 F (38.9 C)  Propofol: 6.1 ml/hr (10 mcg) provides: 161 kcal  Medications reviewed and include: senokot-s  Labs reviewed: K+ 3.3 (L) BP: 126/79 MAP: 94   I/O: 1005 ml since admit UOP: +4227 ml x 24 hrs OG tube in place, tip below diaphram:  Vital High Protein @ 40 ml/hr with 30 ml Prostat Total kcal:  1321 kcal and 114 grams protein  Diet Order:   Diet Order            Diet NPO time specified  Diet effective now              EDUCATION NEEDS:   Not appropriate for education at this time  Skin:  Skin Assessment: Skin Integrity Issues: Skin Integrity Issues:: Incisions Incisions: closed head  Last BM:  11/25 type 6 - medium  Height:   Ht Readings from Last 1 Encounters:  05/10/18 5\' 10"  (1.778 m)    Weight:   Wt Readings from Last 1 Encounters:  05/10/18 101 kg    Ideal Body Weight:  68.2 kg  BMI:  Body mass index is 31.95 kg/m.  Estimated Nutritional Needs:   Kcal:  6579-0383  Protein:  > 136 grams  Fluid:  > 1.2 L  Tome, Nichols,  CNSC [phone] Pager [phone] After Hours Pager

## 2018-05-13 NOTE — Progress Notes (Signed)
PT Cancellation Note  Patient Details Name: [name] [name] MRN: [id number] DOB: 1986/03/21   Cancelled Treatment:    Reason Eval/Treat Not Completed: Medical issues which prohibited therapy(pt remains on vent and bedrest. Will sign off and await new order when appropriate for therapy)   Avrey Hyser B Amory Simonetti 05/13/2018, 6:59 AM  [place] Pager: [phone] Office: [phone]

## 2018-05-13 NOTE — Progress Notes (Signed)
[facility] Progress Note Patient Name: [name] [name] DOB: 1986-02-22 MRN: [id number]   Date of Service  05/13/2018  HPI/Events of Note  Diarrhea - Request for Flexiseal.   eICU Interventions  Will order Flexiseal.      Intervention Category Major Interventions: Other:  Lysle Dingwall 05/13/2018, 10:38 PM

## 2018-05-13 NOTE — Progress Notes (Signed)
NAME:  [name] [name], MRN:  [id number], DOB:  Dec 16, 1985, LOS: 4 ADMISSION DATE:  05/09/2018, CONSULTATION DATE:  05/10/2018 REFERRING MD:  Dr. Vertell Limber, CHIEF COMPLAINT:  Post op vent, Clipping   Brief History   32 year old female admitted 11/21 with SAH and taken to OR for clipping 11/22, but remained on the ventilator in the post-operative setting.   Past Medical History  Asthma, Allergies, Back pain, Eczema, HTN, lactose intolerance, teratoma  Significant Hospital Events   11/21 admit with [facility] 11/22 to OR for clipping of A. Com. Aneurysm.   Consults:  Neurosurgery 11/21 >>   Procedures:  ETT 11/21 >>   Significant Diagnostic Tests:  CT head 11/21 > Small volume subarachnoid hemorrhage, likely redistributed into fourth ventricle with early suspected hydrocephalus. 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 Mild luminal irregularity of cerebral arteries concerning for vasospasm. CT Head 11/23 >> lateral ventricles decreased in size, no new bleeding, small amount of residual pneumocephalus.  Small amount of hyperdensity around the left caudate head noted by Dr. Annette Stable, concerning for possible infarct CT head 11/25 >> hypoattenuation Lt anterior cerebral artery territory  Micro Data:    Antimicrobials:    Interim history/subjective:  Remains on full vent support.   Objective   Blood pressure (!) 144/100, pulse 62, temperature 98 F (36.7 C), temperature source Axillary, resp. rate 18, height 5\' 10"  (1.778 m), weight 101 kg, last menstrual period 05/03/2018, SpO2 100 %, currently breastfeeding.    Vent Mode: PRVC FiO2 (%):  [30 %] 30 % Set Rate:  [18 bmp] 18 bmp Vt Set:  [540 mL] 540 mL PEEP:  [5 cmH20] 5 cmH20 Pressure Support:  [15 cmH20] 15 cmH20 Plateau Pressure:  [14 cmH20-16 cmH20] 16 cmH20   Intake/Output Summary (Last 24 hours) at 05/13/2018 0925 Last data filed at 05/13/2018 0700 Gross per 24 hour  Intake 2191.48 ml  Output 1080 ml  Net 1111.48 ml    Filed Weights   05/09/18 2145 05/10/18 1511  Weight: 101 kg 101 kg    Examination:  General - sedated Eyes - pupils equal, periorbital edema b/l ENT - ETT in place Cardiac - regular rate/rhythm, no murmur Chest - equal breath sounds b/l, no wheezing or rales Abdomen - soft, non tender, + bowel sounds Extremities - no cyanosis, clubbing, or edema Skin - no rashes Neuro - RASS -3   Assessment & Plan:   Subarachnoid hemorrhage. 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 Plan - continue nimodipine  Acute respiratory failure with compromised airway. Plan - pressure support wean as able  Fever 11/24 >> could be related to [facility]; no other signs of infection. Plan - monitor fever curve off Abx for now  Hx of allergies, asthma. Plan - continue singulair, claritin - prn albuterol  Hypertension. Plan - cleviprex as needed for goal SBP < 180 per neurosurgery - continue norvasc  Hypokalemia. Plan - replace as needed    Best practice:  Diet: tube feeds DVT prophylaxis: SCDs GI prophylaxis: Protonix Mobility: Bed rest Code Status: full Family Communication:updated family at bedside  Labs    CMP Latest Ref Rng & Units 05/13/2018 05/12/2018 05/10/2018  Glucose 70 - 99 mg/dL 106(H) 108(H) 147(H)  BUN 6 - 20 mg/dL 7 <5(L) <5(L)  Creatinine 0.44 - 1.00 mg/dL 0.61 0.56 0.70  Sodium 135 - 145 mmol/L 141 137 138  Potassium 3.5 - 5.1 mmol/L 3.3(L) 3.7 3.1(L)  Chloride 98 - 111  mmol/L 111 107 111  CO2 22 - 32 mmol/L 24 20(L) 20(L)  Calcium 8.9 - 10.3 mg/dL 8.4(L) 8.2(L) 8.3(L)  Total Protein 6.5 - 8.1 g/dL - - -  Total Bilirubin 0.3 - 1.2 mg/dL - - -  Alkaline Phos 38 - 126 U/L - - -  AST 15 - 41 U/L - - -  ALT 0 - 44 U/L - - -   CBC Latest Ref Rng & Units 05/12/2018 05/10/2018 05/09/2018  WBC 4.0 - 10.5 K/uL 11.7(H) 10.2 5.5  Hemoglobin 12.0 - 15.0 g/dL 11.1(L) 12.9 13.5  Hematocrit 36.0 - 46.0 % 34.3(L) 38.4 41.5  Platelets 150 - 400  K/uL 151 192 201   ABG    Component Value Date/Time   PHART 7.332 (L) 05/10/2018 2315   PCO2ART 41.8 05/10/2018 2315   PO2ART 144.0 (H) 05/10/2018 2315   HCO3 22.2 05/10/2018 2315   TCO2 23 05/10/2018 2315   ACIDBASEDEF 4.0 (H) 05/10/2018 2315   O2SAT 99.0 05/10/2018 2315   CBG (last 3)  No results for input(s): GLUCAP in the last 72 hours.  CC time 33 minutes  Chesley Mires, MD [facility] Pulmonary/Critical Care 05/13/2018, 9:48 AM

## 2018-05-13 NOTE — Progress Notes (Signed)
OT Cancellation Note  Patient Details Name: [name] [name] MRN: [id number] DOB: 12/08/1985   Cancelled Treatment:    Reason Eval/Treat Not Completed: Active bedrest order;Patient not medically ready.  Pt not medically appropriate.  Will sign off and await new orders if she becomes appropriate.  Thank you.  Lucille Passy, OTR/L [place] Pager [phone] Office [phone]   Lucille Passy M 05/13/2018, 7:08 AM

## 2018-05-14 ENCOUNTER — Inpatient Hospital Stay (HOSPITAL_COMMUNITY): Payer: Medicaid Other

## 2018-05-14 LAB — URINALYSIS, ROUTINE W REFLEX MICROSCOPIC
Bacteria, UA: NONE SEEN
Bilirubin Urine: NEGATIVE
GLUCOSE, UA: NEGATIVE mg/dL
KETONES UR: NEGATIVE mg/dL
Leukocytes, UA: NEGATIVE
NITRITE: NEGATIVE
PH: 6 (ref 5.0–8.0)
Protein, ur: NEGATIVE mg/dL
RBC / HPF: >50 RBC/hpf — ABNORMAL HIGH (ref 0–5)
Specific Gravity, Urine: 1.023 (ref 1.005–1.030)

## 2018-05-14 LAB — CBC
HCT: 32.9 % — ABNORMAL LOW (ref 36.0–46.0)
Hemoglobin: 10.6 g/dL — ABNORMAL LOW (ref 12.0–15.0)
MCH: 31.6 pg (ref 26.0–34.0)
MCHC: 32.2 g/dL (ref 30.0–36.0)
MCV: 98.2 fL (ref 80.0–100.0)
Platelets: 190 10*3/uL (ref 150–400)
RBC: 3.35 MIL/uL — ABNORMAL LOW (ref 3.87–5.11)
RDW: 13.6 % (ref 11.5–15.5)
WBC: 9.2 10*3/uL (ref 4.0–10.5)
nRBC: 0 % (ref 0.0–0.2)

## 2018-05-14 LAB — GLUCOSE, CAPILLARY
GLUCOSE-CAPILLARY: 117 mg/dL — AB (ref 70–99)
GLUCOSE-CAPILLARY: 112 mg/dL — AB (ref 70–99)
Glucose-Capillary: 116 mg/dL — ABNORMAL HIGH (ref 70–99)
Glucose-Capillary: 107 mg/dL — ABNORMAL HIGH (ref 70–99)
Glucose-Capillary: 92 mg/dL (ref 70–99)

## 2018-05-14 LAB — BASIC METABOLIC PANEL
ANION GAP: 8 (ref 5–15)
BUN: 10 mg/dL (ref 6–20)
CALCIUM: 8.6 mg/dL — AB (ref 8.9–10.3)
CO2: 23 mmol/L (ref 22–32)
CREATININE: 0.54 mg/dL (ref 0.44–1.00)
Chloride: 112 mmol/L — ABNORMAL HIGH (ref 98–111)
GFR calc Af Amer: >60 mL/min (ref >60)
Glucose, Bld: 116 mg/dL — ABNORMAL HIGH (ref 70–99)
Potassium: 3.2 mmol/L — ABNORMAL LOW (ref 3.5–5.1)
Sodium: 143 mmol/L (ref 135–145)

## 2018-05-14 LAB — PHOSPHORUS
PHOSPHORUS: 3.1 mg/dL (ref 2.5–4.6)
Phosphorus: 3.1 mg/dL (ref 2.5–4.6)

## 2018-05-14 LAB — MAGNESIUM
MAGNESIUM: 1.7 mg/dL (ref 1.7–2.4)
MAGNESIUM: 2 mg/dL (ref 1.7–2.4)

## 2018-05-14 MED ORDER — MAGNESIUM SULFATE 2 GM/50ML IV SOLN
2.0000 g | Freq: Once | INTRAVENOUS | Status: AC
  Administered 2018-05-14: 2 g via INTRAVENOUS
  Filled 2018-05-14: qty 50

## 2018-05-14 MED ORDER — VANCOMYCIN HCL IN DEXTROSE 1-5 GM/200ML-% IV SOLN: 1000.0000 mg | Freq: Three times a day (TID) | INTRAVENOUS | Status: DC

## 2018-05-14 MED ORDER — SODIUM CHLORIDE 0.9 % IV SOLN
INTRAVENOUS | Status: DC | PRN
  Administered 2018-05-14: 250 mL via INTRAVENOUS

## 2018-05-14 MED ORDER — VANCOMYCIN HCL 10 G IV SOLR
2000.0000 mg | Freq: Once | INTRAVENOUS | Status: AC
  Administered 2018-05-14: 2000 mg via INTRAVENOUS
  Filled 2018-05-14: qty 2000

## 2018-05-14 MED ORDER — PIPERACILLIN-TAZOBACTAM 3.375 G IVPB
3.3750 g | Freq: Three times a day (TID) | INTRAVENOUS | Status: DC
  Administered 2018-05-14 – 2018-05-15 (×3): 3.375 g via INTRAVENOUS
  Filled 2018-05-14 (×4): qty 50

## 2018-05-14 MED ORDER — POTASSIUM CHLORIDE 20 MEQ/15ML (10%) PO SOLN
40.0000 meq | Freq: Two times a day (BID) | ORAL | Status: DC
  Administered 2018-05-14 – 2018-05-16 (×6): 40 meq via ORAL
  Filled 2018-05-14 (×6): qty 30

## 2018-05-14 MED ORDER — VANCOMYCIN HCL IN DEXTROSE 1-5 GM/200ML-% IV SOLN
1000.0000 mg | Freq: Three times a day (TID) | INTRAVENOUS | Status: DC
  Administered 2018-05-14 – 2018-05-17 (×8): 1000 mg via INTRAVENOUS
  Filled 2018-05-14 (×8): qty 200

## 2018-05-14 MED ORDER — STROKE: EARLY STAGES OF RECOVERY BOOK
Freq: Once | Status: AC
  Administered 2018-05-14: 11:00:00
  Filled 2018-05-14: qty 1

## 2018-05-14 MED ORDER — VANCOMYCIN HCL 10 G IV SOLR
2000.0000 mg | Freq: Once | INTRAVENOUS | Status: DC
  Filled 2018-05-14: qty 2000

## 2018-05-14 NOTE — Progress Notes (Signed)
Pharmacy Antibiotic Note  [name] [name] is a 32 y.o. female admitted on 05/09/2018 with aneurysm. Continues to spike fevers, CXR suggestive of effeusion. Pharmacy has been consulted for vancomycin and Zosyn dosing.  Plan: Start vancomycin 2g IV x 1, then start vancomycin 1g IV Q8h Start Zosyn 3.375 gm IV q8h (4 hour infusion) Monitor clinical picture, renal function, VT prn F/U C&S, abx deescalation / LOT  Height: 5\' 10"  (177.8 cm) Weight: 223 lb 12.3 oz (101.5 kg) IBW/kg (Calculated) : 68.5  Temp (24hrs), Avg:100.4 F (38 C), Min:98.4 F (36.9 C), Max:102.3 F (39.1 C)  Recent Labs  Lab 05/09/18 1538 05/10/18 0001 05/12/18 1229 05/12/18 1400 05/13/18 0708 05/14/18 0553  WBC 5.5 10.2  --  11.7*  --  9.2  CREATININE 0.61 0.70 0.56  --  0.61 0.54    Estimated Creatinine Clearance: 130.2 mL/min (by C-G formula based on SCr of 0.54 mg/dL).    Allergies  Allergen Reactions  . Sulfa Antibiotics Swelling    Causes swelling on the face.  . Shellfish Allergy Other (See Comments)    Stomach upset   Thank you for allowing pharmacy to be a part of this patient's care.  Reginia Naas 05/14/2018 9:55 AM

## 2018-05-14 NOTE — Op Note (Signed)
NEUROSURGERY OPERATIVE NOTE   PREOP DIAGNOSIS:  1. Subarachnoid hemorrhage   POSTOP DIAGNOSIS: Same  PROCEDURE: 1. Left pterional craniotomy for clipping of anterior communicating artery aneurysm  SURGEON: Dr. Consuella Lose, MD  ASSISTANT: Dr. Charlie Pitter, MD  ANESTHESIA: General Endotracheal  EBL: 300cc  SPECIMENS: None  DRAINS: None  COMPLICATIONS: None immediate  CONDITION: Hemodynamically stable to PACU  HISTORY: [name] [name] is a 32 y.o. female presenting subacutely with several days of worsening headache.  Initial CT scan in the emergency department demonstrated diffuse basal subarachnoid hemorrhage.  CT angiogram also demonstrated the presence of a multilobulated anterior communicating artery aneurysm.  She underwent diagnostic cerebral angiogram with him which confirmed the presence of a multilobulated approximately 4 mm for millimeter anterior communicating artery aneurysm which was not amenable to coil embolization.  She therefore presents for operative surgical clip ligation.  The risks and benefits of the surgery were explained in detail with the patient's mother.  After all questions were answered informed consent was obtained and witnessed.  PROCEDURE IN DETAIL: The patient was brought to the operating room. After induction of general anesthesia, the patient was positioned on the operative table in the [name] head holder in the supine position. All pressure points were meticulously padded.  Standard left-sided frontotemporal curvilinear skin incision was then marked out and prepped and draped in the usual sterile fashion.  After timeout was conducted, the skin incision was infiltrated with local anesthetic without epinephrine.  Incision was then made sharply and dissected through the subcutaneous tissue.  The galea was then incised.  Raney clips were applied for hemostasis.  The superficial temporal fascia was then incised, and interfascial dissection was  carried out to elevate the skin flap anteriorly.  Temporalis muscle was then incised just inferior to the superior temporal line and reflected inferiorly.  Standard frontotemporal craniotomy was then fashioned with high-speed drill and craniotome.  Hemostasis in the epidural surface was achieved with bipolar electrocautery and morselized Gelfoam and thrombin.  The lesser wing of the sphenoid was then drilled down with a high-speed drill to allow direct visualization of the basal vascular structures.  At this point the dura was opened in curvilinear fashion based anteriorly and tacked up with 4 Nurolon stitches.  Microscope was then draped sterilely and brought into the field and the remainder of the clipping procedure was performed under the microscope using microdissection technique.  The Doro retractor system was attached to the [name], and temporary clips were selected in order to prepare for intraoperative rupture and temporary aneurysm clipping.  Initially, a subfrontal approach was taken to identify the olfactory nerve, optic nerve, and the arachnoid overlying the optic nerve was incised to start to release CSF from the optical carotid cistern.  Dissection was then carried medially to release arachnoid and CSF in the inter-optic space.  At this point, attention was then turned more laterally, and arachnoid dissection was carried out to identify the supraclinoid internal carotid artery.  The medial portion of the sylvian fissure was then split to allow further access to the distal internal carotid artery.  The ICA bifurcation was identified, and the A1 was identified and traced over the optic nerve.  A segment of the left A1 was identified free of any perforators to use for temporary clipping in case of intraoperative rupture.  Dissection was then carried further medially as the anterior cerebral artery entered the interhemispheric fissure.  In order to facilitate better visualization, the gyrus rectus was  coagulated and  removed with bipolar electrocautery and suction.  I was unable to visualize the neck of the aneurysm.  Dissection was carried to the contralateral side to identify the contralateral A1.  At this point, using a combination of sharp and blunt dissection, the multiple aneurysm domes were identified.  Of note, there was a small bleb at the junction of the left A1 and A2, as well as a larger lobe of the aneurysm projecting anteriorly, a small lobe projecting inferiorly and toward the right A1, and another lobe which was only partially visualized projecting towards the right.  I was able to identify the ipsilateral A2, although it was very difficult to visualize the contralateral A2.  I was also able to identify recurrent lenticulostriate arteries bilaterally.  Having dissected the aneurysm, a straight permanent aneurysm clip was selected and placed across the neck of the aneurysm taking care to preserve the contralateral A1 and the ipsilateral A2.  ICG video angiography was performed which did demonstrate patency of bilateral A1c and the visualized ipsilateral A2.  Unfortunately, there did appear to be slight filling of the distal aneurysm lobes, and further inspection indicated the aneurysm clip was not completely across the aneurysm neck.  We therefore removed the mini aneurysm clip and replaced with a standard straight titanium clip which was placed in essentially the same position.  Repeat ICG video angiography was again performed which now demonstrated patency of the bilateral A1's, and the visualized ipsilateral A2.  There was no aneurysm filling identified.  The field was then irrigated with copious amount of normal saline irrigation.  No bleeding was identified.  Retractors were then removed, galea was reapproximated with interrupted 4-0 Nurolon stitches.  A collagen DuraMatrix onlay graft was placed.  The bone flap was then replaced and plated with standard titanium plates and screws.  The  temporalis muscle and fascia were reapproximated with interrupted 0 Vicryl stitches.  Briant Cedar was closed with interrupted 0 and 3-0 Vicryl stitches and the skin was closed in standard surgical skin staples.  Bacitracin ointment and sterile dressing was applied.  At the end of the case all sponge, needle, instrument, and cottonoid counts were correct.  The patient was removed from the [name] head holder, and taken back to the neuro intensive care unit intubated in stable hemodynamic condition.

## 2018-05-14 NOTE — Progress Notes (Signed)
SLP Cancellation Note  Patient Details Name: [name] [name] MRN: [id number] DOB: Sep 18, 1985   Cancelled treatment:       Reason Eval/Treat Not Completed: Medical issues which prohibited therapy. Pt remains intubated.  Will continue to follow along as pt is medically appropriate.    Latiffany Harwick, Selinda Orion 05/14/2018, 10:05 AM

## 2018-05-14 NOTE — Progress Notes (Addendum)
NAME:  [name] [name], MRN:  [id number], DOB:  11/29/1985, LOS: 5 ADMISSION DATE:  05/09/2018, CONSULTATION DATE:  05/10/2018 REFERRING MD:  Dr. Vertell Limber, CHIEF COMPLAINT:  Post op vent, Clipping   Brief History   32 year old female admitted 11/21 with SAH and taken to OR for clipping 11/22, but remained on the ventilator in the post-operative setting.   Past Medical History  Asthma, Allergies, Back pain, Eczema, HTN, lactose intolerance, teratoma  Significant Hospital Events   11/21 admit with [facility] 11/22 to OR for clipping of A. Com. Aneurysm.   Consults:  Neurosurgery 11/21 >>   Procedures:  ETT 11/21 >>   Significant Diagnostic Tests:  CT head 11/21 > Small volume subarachnoid hemorrhage, likely redistributed into fourth ventricle with early suspected hydrocephalus. 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 Mild luminal irregularity of cerebral arteries concerning for vasospasm. CT Head 11/23 >> lateral ventricles decreased in size, no new bleeding, small amount of residual pneumocephalus.  Small amount of hyperdensity around the left caudate head noted by Dr. Annette Stable, concerning for possible infarct CT head 11/25 >> hypoattenuation Lt anterior cerebral artery territory  Micro Data:  11/22 Staph Aureus positive 11/22  MRSA negative  Antimicrobials:  Vanc 11/26>> Zoxyn 11/26  Interim history/subjective:  Weaning well 11/26>> not following commands Continue fever 102.3 Blood and sputum Cx Start antimicrobials   Objective   Blood pressure (!) 168/87, pulse 78, temperature (!) 102.3 F (39.1 C), temperature source Axillary, resp. rate (!) 25, height 5\' 10"  (1.778 m), weight 101.5 kg, last menstrual period 05/03/2018, SpO2 100 %, currently breastfeeding.    Vent Mode: PSV;CPAP FiO2 (%):  [30 %] 30 % Set Rate:  [18 bmp] 18 bmp Vt Set:  [540 mL] 540 mL PEEP:  [5 cmH20] 5 cmH20 Pressure Support:  [5 cmH20] 5 cmH20 Plateau Pressure:  [15 cmH20-20 cmH20] 15 cmH20    Intake/Output Summary (Last 24 hours) at 05/14/2018 3646 Last data filed at 05/14/2018 0600 Gross per 24 hour  Intake 2228.3 ml  Output 2450 ml  Net -221.7 ml   Filed Weights   05/09/18 2145 05/10/18 1511 05/14/18 0500  Weight: 101 kg 101 kg 101.5 kg    Examination:  General - sedated and intubated, weaning Eyes - pupils equal, periorbital edema b/l ENT - ETT in place and secure, OG in place with TF infusing Cardiac - S1, S2, regular rate/rhythm, no murmur, rub or gallop Chest - Bilateral chest excursion, equal breath sounds b/l, no wheezing or rales, diminished per bases Abdomen - soft, non tender, + bowel sounds, obese Extremities - no cyanosis, clubbing, Trace BLE  edema Skin - no rashes, no lesions, warm, dry and intact Neuro - RASS -3   Assessment & Plan:   Subarachnoid hemorrhage. 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 Plan - continue nimodipine - Monitor BP - per neuro  Acute respiratory failure with compromised airway. Weaning 11/26 am Not following commands Plan - pressure support wean as able - wean and titrate oxygen and PEEP as able - CXR prn - ABG prn  Fever 11/24 >> could be related to [facility]; no other signs of infection. T max 102.3 on 11/26 L base atelectasis with effusion per CXR 11/26  Plan - monitor fever curve off Abx for now - Will culture blood, sputum, check UA - Start broad spectrum antibiotics - DC foley  Hx of allergies, asthma. Plan - continue singulair, claritin - prn albuterol  Hypertension. Plan - cleviprex as needed for goal SBP < 180 per neurosurgery - continue norvasc - Continue to monitor  Hypokalemia. Hypomag Plan - replace electrolytes  as needed - Trend BMET  Nutrition Plan Continue TF  Diarrhea Developed 11/26 early am Plan: Flexiseal C diff not recommended as patient has received laxative within last 24 hours I&O, Monitor for volume loss    Best practice:  Diet: tube  feeds DVT prophylaxis: SCDs GI prophylaxis: Protonix Mobility: Bed rest Code Status: full Family Communication:updated family at bedside  Labs    CMP Latest Ref Rng & Units 05/14/2018 05/13/2018 05/12/2018  Glucose 70 - 99 mg/dL 116(H) 106(H) 108(H)  BUN 6 - 20 mg/dL 10 7 <5(L)  Creatinine 0.44 - 1.00 mg/dL 0.54 0.61 0.56  Sodium 135 - 145 mmol/L 143 141 137  Potassium 3.5 - 5.1 mmol/L 3.2(L) 3.3(L) 3.7  Chloride 98 - 111 mmol/L 112(H) 111 107  CO2 22 - 32 mmol/L 23 24 20(L)  Calcium 8.9 - 10.3 mg/dL 8.6(L) 8.4(L) 8.2(L)  Total Protein 6.5 - 8.1 g/dL - - -  Total Bilirubin 0.3 - 1.2 mg/dL - - -  Alkaline Phos 38 - 126 U/L - - -  AST 15 - 41 U/L - - -  ALT 0 - 44 U/L - - -   CBC Latest Ref Rng & Units 05/14/2018 05/12/2018 05/10/2018  WBC 4.0 - 10.5 K/uL 9.2 11.7(H) 10.2  Hemoglobin 12.0 - 15.0 g/dL 10.6(L) 11.1(L) 12.9  Hematocrit 36.0 - 46.0 % 32.9(L) 34.3(L) 38.4  Platelets 150 - 400 K/uL 190 151 192   ABG    Component Value Date/Time   PHART 7.332 (L) 05/10/2018 2315   PCO2ART 41.8 05/10/2018 2315   PO2ART 144.0 (H) 05/10/2018 2315   HCO3 22.2 05/10/2018 2315   TCO2 23 05/10/2018 2315   ACIDBASEDEF 4.0 (H) 05/10/2018 2315   O2SAT 99.0 05/10/2018 2315   CBG (last 3)  Recent Labs    05/13/18 2353 05/14/18 0404 05/14/18 0801  GLUCAP 88 [place], AGACNP-BC [facility] 05/14/2018, 9:07 AM

## 2018-05-14 NOTE — Progress Notes (Signed)
  NEUROSURGERY PROGRESS NOTE   Pt seen and examined. No issues overnight.   EXAM: Temp:  [98.4 F (36.9 C)-102.3 F (39.1 C)] 102.3 F (39.1 C) (11/26 0800) Pulse Rate:  [61-99] 78 (11/26 0828) Resp:  [17-25] 25 (11/26 0828) BP: (122-168)/(64-102) 168/87 (11/26 0600) SpO2:  [95 %-100 %] 100 % (11/26 0828) FiO2 (%):  [30 %] 30 % (11/26 0828) Weight:  [101.5 kg] 101.5 kg (11/26 0500) Intake/Output      11/25 0701 - 11/26 0700 11/26 0701 - 11/27 0700   I.V. (mL/kg) 1951.7 (19.2)    NG/GT 276.6    Total Intake(mL/kg) 2228.3 (22)    Urine (mL/kg/hr) 1450 (0.6)    Emesis/NG output     Stool 1000    Total Output 2450    Net -221.7         Stool Occurrence 2 x     On very low-dose propofol/fentanyl Not opening eyes to voice Breathing spontaneously W/d RUE/RLE to pain, minimal w/d LUE/LLE  LABS: Lab Results  Component Value Date   CREATININE 0.54 05/14/2018   BUN 10 05/14/2018   NA 143 05/14/2018   K 3.2 (L) 05/14/2018   CL 112 (H) 05/14/2018   CO2 23 05/14/2018   Lab Results  Component Value Date   WBC 9.2 05/14/2018   HGB 10.6 (L) 05/14/2018   HCT 32.9 (L) 05/14/2018   MCV 98.2 05/14/2018   PLT 190 05/14/2018    IMAGING: CTH yesterday reviewed, shows left ACA territory infarction. No HCP  IMPRESSION: - 32 y.o. 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

## 2018-05-15 ENCOUNTER — Inpatient Hospital Stay (HOSPITAL_COMMUNITY): Payer: Medicaid Other

## 2018-05-15 DIAGNOSIS — I609 Nontraumatic subarachnoid hemorrhage, unspecified: Secondary | ICD-10-CM

## 2018-05-15 LAB — BASIC METABOLIC PANEL
ANION GAP: 8 (ref 5–15)
BUN: 12 mg/dL (ref 6–20)
CALCIUM: 8.8 mg/dL — AB (ref 8.9–10.3)
CO2: 23 mmol/L (ref 22–32)
Chloride: 112 mmol/L — ABNORMAL HIGH (ref 98–111)
Creatinine, Ser: 0.54 mg/dL (ref 0.44–1.00)
GFR calc non Af Amer: >60 mL/min (ref >60)
Glucose, Bld: 138 mg/dL — ABNORMAL HIGH (ref 70–99)
Potassium: 4.3 mmol/L (ref 3.5–5.1)
Sodium: 143 mmol/L (ref 135–145)

## 2018-05-15 LAB — GLUCOSE, CAPILLARY
GLUCOSE-CAPILLARY: 101 mg/dL — AB (ref 70–99)
GLUCOSE-CAPILLARY: 101 mg/dL — AB (ref 70–99)
GLUCOSE-CAPILLARY: 107 mg/dL — AB (ref 70–99)
Glucose-Capillary: 98 mg/dL (ref 70–99)
Glucose-Capillary: 89 mg/dL (ref 70–99)
Glucose-Capillary: 144 mg/dL — ABNORMAL HIGH (ref 70–99)

## 2018-05-15 LAB — CBC
HEMATOCRIT: 33.4 % — AB (ref 36.0–46.0)
HEMOGLOBIN: 10.7 g/dL — AB (ref 12.0–15.0)
MCH: 32.1 pg (ref 26.0–34.0)
MCHC: 32 g/dL (ref 30.0–36.0)
MCV: 100.3 fL — ABNORMAL HIGH (ref 80.0–100.0)
NRBC: 0 % (ref 0.0–0.2)
Platelets: 202 10*3/uL (ref 150–400)
RBC: 3.33 MIL/uL — AB (ref 3.87–5.11)
RDW: 13.7 % (ref 11.5–15.5)
WBC: 7.7 10*3/uL (ref 4.0–10.5)

## 2018-05-15 LAB — MAGNESIUM: Magnesium: 1.9 mg/dL (ref 1.7–2.4)

## 2018-05-15 MED ORDER — PRO-STAT SUGAR FREE PO LIQD
30.0000 mL | Freq: Two times a day (BID) | ORAL | Status: DC
  Administered 2018-05-15 – 2018-05-20 (×11): 30 mL
  Filled 2018-05-15 (×11): qty 30

## 2018-05-15 MED ORDER — JEVITY 1.2 CAL PO LIQD
1000.0000 mL | ORAL | Status: DC
  Administered 2018-05-15: 1000 mL
  Administered 2018-05-16: 13:00:00
  Administered 2018-05-17 – 2018-05-18 (×2): 1000 mL
  Administered 2018-05-18 – 2018-05-19 (×2)
  Filled 2018-05-15 (×9): qty 1000

## 2018-05-15 MED ORDER — HYDRALAZINE HCL 20 MG/ML IJ SOLN: 5.0000 mg | INTRAMUSCULAR | Status: DC | PRN

## 2018-05-15 MED ORDER — SODIUM CHLORIDE 0.9 % IV SOLN
2.0000 g | Freq: Two times a day (BID) | INTRAVENOUS | Status: AC
  Administered 2018-05-15 – 2018-05-22 (×14): 2 g via INTRAVENOUS
  Filled 2018-05-15 (×14): qty 2

## 2018-05-15 NOTE — Progress Notes (Signed)
Nutrition Follow-up  DOCUMENTATION CODES:   Obesity unspecified  INTERVENTION:   D/C Vital High   Initiate Jevity 1.2 @ 60 ml/hr via Cortrak tube 30 ml Prostat BID  Provides: 1928 kcal, 110 grams protein, and 1167 ml free water.   NUTRITION DIAGNOSIS:   Inadequate oral intake related to inability to eat as evidenced by NPO status. Ongoing.   GOAL:   Patient will meet greater than or equal to 90% of their needs Progressing.   MONITOR:   TF tolerance, Diet advancement  REASON FOR ASSESSMENT:   Consult Enteral/tube feeding initiation and management  ASSESSMENT:   Pt with PMH of HTN admitted 11/21 with SAH s/p clipping 11/22.   Pt discussed during ICU rounds and with RN.  11/27 extubated but remains too lethargic for SLP, Cortrak placed (tip in stomach)  Medications reviewed and include: senokot-s, MVI, 40 mEq KCl BID Labs reviewed   I/O: +5762 ml since admit UOP: 2150 ml x 24 hrs    Diet Order:   Diet Order            Diet NPO time specified  Diet effective now              EDUCATION NEEDS:   Not appropriate for education at this time  Skin:  Skin Assessment: Skin Integrity Issues: Skin Integrity Issues:: Incisions Incisions: closed head  Last BM:  450 ml x 24 hr   Height:   Ht Readings from Last 1 Encounters:  05/10/18 5\' 10"  (1.778 m)    Weight:   Wt Readings from Last 1 Encounters:  05/15/18 101.1 kg    Ideal Body Weight:  68.2 kg  BMI:  Body mass index is 31.98 kg/m.  Estimated Nutritional Needs:   Kcal:  1900-2100  Protein:  100-115 grams  Fluid:  > 1.9 L/day  Maylon Peppers RD, LDN, CNSC [phone] Pager [phone] After Hours Pager

## 2018-05-15 NOTE — Progress Notes (Signed)
NAME:  [name] [name], MRN:  [id number], DOB:  21-Sep-1985, LOS: 6 ADMISSION DATE:  05/09/2018, CONSULTATION DATE:  05/10/2018 REFERRING MD:  Dr. Vertell Limber, CHIEF COMPLAINT:  Post op vent, Clipping   Brief History   32 year old female admitted 11/21 with SAH and taken to OR for clipping 11/22, but remained on the ventilator in the post-operative setting.   Past Medical History  Asthma, Allergies, Back pain, Eczema, HTN, lactose intolerance, teratoma  Significant Hospital Events   11/21 admit with [facility] 11/22 to OR for clipping of A. Com. Aneurysm.   Consults:  Neurosurgery 11/21 >>   Procedures:  ETT 11/21 >>   Significant Diagnostic Tests:  CT head 11/21 > Small volume subarachnoid hemorrhage, likely redistributed into fourth ventricle with early suspected hydrocephalus. 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 Mild luminal irregularity of cerebral arteries concerning for vasospasm. CT Head 11/23 >> lateral ventricles decreased in size, no new bleeding, small amount of residual pneumocephalus.  Small amount of hyperdensity around the left caudate head noted by Dr. Annette Stable, concerning for possible infarct CT head 11/25 >> hypoattenuation Lt anterior cerebral artery territory  Micro Data:  Sputum 11/26 >>  Blood 11/26 >>   Antimicrobials:  Vanc 11/26 >> Zoxyn 11/26 >>   Interim history/subjective:  Tolerating pressure support.  Remains on sedation.  Objective   Blood pressure 140/87, pulse (!) 59, temperature 98.8 F (37.1 C), temperature source Axillary, resp. rate 18, height 5\' 10"  (1.778 m), weight 101.1 kg, last menstrual period 05/03/2018, SpO2 100 %, currently breastfeeding.    Vent Mode: PRVC FiO2 (%):  [30 %] 30 % Set Rate:  [18 bmp] 18 bmp Vt Set:  [540 mL] 540 mL PEEP:  [5 cmH20] 5 cmH20 Pressure Support:  [10 cmH20] 10 cmH20 Plateau Pressure:  [14 cmH20-17 cmH20] 17 cmH20   Intake/Output Summary (Last 24 hours) at 05/15/2018 0838 Last data filed at  05/15/2018 0700 Gross per 24 hour  Intake 3254.96 ml  Output 2600 ml  Net 654.96 ml   Filed Weights   05/10/18 1511 05/14/18 0500 05/15/18 0500  Weight: 101 kg 101.5 kg 101.1 kg    Examination:  General - sedated Eyes - pupils reactive ENT - ETT in place Cardiac - regular rate/rhythm, no murmur Chest - equal breath sounds b/l, no wheezing or rales Abdomen - soft, non tender, + bowel sounds Extremities - 1+ edema Skin - no rashes Neuro - RASS -2  CXR 11/27 (reviewed by me) >> decreased Lt base ASD    Assessment & Plan:   Subarachnoid hemorrhage. 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 Plan - continue nimodipine - PT/OT/Speech therapy after extubation - neurosurgery following  Acute respiratory failure with compromised airway. Plan - might be ready for extubation trial soon  Fever with concern for HCAP. Plan - day 2 of ABx - f/u cultures from 11/26  Hx of allergies, asthma. Plan - singulair, claritin with prn albuterol  Hypertension. Plan - goal SBP < 180 - continue norvasc  Hypokalemia. Hypomagnesemia. Plan - replace as needed  Anemia of critical illness. Plan - f/u CBC - transfuse for Hb < 7  Diarrhea. Plan: - flexiseal placed  Best practice:  Diet: tube feeds DVT prophylaxis: SCDs GI prophylaxis: Protonix Mobility: Bed rest Code Status: full Family Communication: updated family at bedside  Labs    CMP Latest Ref Rng & Units 05/15/2018 05/14/2018 05/13/2018  Glucose 70 - 99 mg/dL 138(H) 116(H)  106(H)  BUN 6 - 20 mg/dL 12 10 7   Creatinine 0.44 - 1.00 mg/dL 0.54 0.54 0.61  Sodium 135 - 145 mmol/L 143 143 141  Potassium 3.5 - 5.1 mmol/L 4.3 3.2(L) 3.3(L)  Chloride 98 - 111 mmol/L 112(H) 112(H) 111  CO2 22 - 32 mmol/L 23 23 24   Calcium 8.9 - 10.3 mg/dL 8.8(L) 8.6(L) 8.4(L)  Total Protein 6.5 - 8.1 g/dL - - -  Total Bilirubin 0.3 - 1.2 mg/dL - - -  Alkaline Phos 38 - 126 U/L - - -  AST 15 - 41 U/L - - -    ALT 0 - 44 U/L - - -   CBC Latest Ref Rng & Units 05/15/2018 05/14/2018 05/12/2018  WBC 4.0 - 10.5 K/uL 7.7 9.2 11.7(H)  Hemoglobin 12.0 - 15.0 g/dL 10.7(L) 10.6(L) 11.1(L)  Hematocrit 36.0 - 46.0 % 33.4(L) 32.9(L) 34.3(L)  Platelets 150 - 400 K/uL 202 190 151   ABG    Component Value Date/Time   PHART 7.332 (L) 05/10/2018 2315   PCO2ART 41.8 05/10/2018 2315   PO2ART 144.0 (H) 05/10/2018 2315   HCO3 22.2 05/10/2018 2315   TCO2 23 05/10/2018 2315   ACIDBASEDEF 4.0 (H) 05/10/2018 2315   O2SAT 99.0 05/10/2018 2315   CBG (last 3)  Recent Labs    05/14/18 2024 05/14/18 2358 05/15/18 0359  GLUCAP 92 107* 98    CC time 32 minutes  Chesley Mires, MD [place] 05/15/2018, 8:43 AM

## 2018-05-15 NOTE — Progress Notes (Signed)
SLP Cancellation Note  Patient Details Name: [name] [name] MRN: [id number] DOB: [birth year]   Cancelled treatment:       Reason Eval/Treat Not Completed: Fatigue/lethargy limiting ability to participate.  Pt extubated today; extremely lethargic, unable to participate in swallow or language evaluations.  Cortrak placed this afternoon.  SLP will follow for readiness.   [name] 05/15/2018, 2:38 PM

## 2018-05-15 NOTE — Progress Notes (Signed)
Patients blood pressures have been running high despite multiple interventions including PRN Hydralazine 20mg  IV. Neurosurgery was paged. MD aware. Per MD elevated BP beyond 140 is okay as long as not exceeding 200.

## 2018-05-15 NOTE — Progress Notes (Signed)
  NEUROSURGERY PROGRESS NOTE   Pt seen and examined. No issues overnight.   EXAM: Temp:  [98.8 F (37.1 C)-100.6 F (38.1 C)] 99 F (37.2 C) (11/27 0800) Pulse Rate:  [58-104] 60 (11/27 0835) Resp:  [18-27] 20 (11/27 0835) BP: (116-165)/(84-104) 144/97 (11/27 0835) SpO2:  [98 %-100 %] 100 % (11/27 0835) FiO2 (%):  [30 %] 30 % (11/27 0835) Weight:  [101.1 kg] 101.1 kg (11/27 0500) Intake/Output      11/26 0701 - 11/27 0700 11/27 0701 - 11/28 0700   I.V. (mL/kg) 1783.3 (17.6)    NG/GT 1062    IV Piggyback 582.9    Total Intake(mL/kg) 3428.2 (33.9)    Urine (mL/kg/hr) 2150 (0.9)    Stool 450    Total Output 2600    Net +828.2          Eyes open spontaneously, tracking Breathing spontaneously Follows commands LUE/LLE in delayed fashion, W/d RUE, minimal movement RLE  LABS: Lab Results  Component Value Date   CREATININE 0.54 05/15/2018   BUN 12 05/15/2018   NA 143 05/15/2018   K 4.3 05/15/2018   CL 112 (H) 05/15/2018   CO2 23 05/15/2018   Lab Results  Component Value Date   WBC 7.7 05/15/2018   HGB 10.7 (L) 05/15/2018   HCT 33.4 (L) 05/15/2018   MCV 100.3 (H) 05/15/2018   PLT 202 05/15/2018     IMPRESSION: - 32 y.o. 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   PLAN: - Hopefully extubate today - TCD today - Cont supportive care

## 2018-05-15 NOTE — Progress Notes (Signed)
Pharmacy Antibiotic Note  [name] [name] is a 32 y.o. female admitted on 05/09/2018 with aneurysm. Continues to spike fevers, CXR suggestive of effeusion. Pharmacy has been consulted for vancomycin and cefepime dosing.  New CXR shows near complete resolution of L basilar infiltrative density. Discussed antibiotics with CCM and will continue broad coverage at this time.  Plan: Continue vancomycin 1g IV Q8h Stop Zosyn Start cefepime 2g IV Q12h Monitor clinical picture, renal function, VT prn F/U C&S, abx deescalation / LOT  Height: 5\' 10"  (177.8 cm) Weight: 222 lb 14.2 oz (101.1 kg) IBW/kg (Calculated) : 68.5  Temp (24hrs), Avg:99.9 F (37.7 C), Min:98.8 F (37.1 C), Max:100.6 F (38.1 C)  Recent Labs  Lab 05/09/18 1538 05/10/18 0001 05/12/18 1229 05/12/18 1400 05/13/18 0708 05/14/18 0553 05/15/18 0615  WBC 5.5 10.2  --  11.7*  --  9.2 7.7  CREATININE 0.61 0.70 0.56  --  0.61 0.54 0.54    Estimated Creatinine Clearance: 129.9 mL/min (by C-G formula based on SCr of 0.54 mg/dL).    Allergies  Allergen Reactions  . Sulfa Antibiotics Swelling    Causes swelling on the face.  . Shellfish Allergy Other (See Comments)    Stomach upset   Thank you for allowing pharmacy to be a part of this patient's care.  Reginia Naas 05/15/2018 8:53 AM

## 2018-05-15 NOTE — Progress Notes (Signed)
TCD competed. Left transtemporal window is very limited due to bandages.  Landry Mellow, RDMS, RVT

## 2018-05-15 NOTE — Progress Notes (Signed)
Transcranial Doppler  Date POD PCO2 HCT BP  MCA ACA PCA OPHT SIPH VERT Basilar  11/27 JE     Right  Left   101  28   -47  *   25  *   20  31   52  53   -47  -57   -32           Right  Left                                            Right  Left                                             Right  Left                                             Right  Left                                            Right  Left                                            Right  Left                                        MCA = Middle Cerebral Artery      OPHT = Opthalmic Artery     BASILAR = Basilar Artery   ACA = Anterior Cerebral Artery     SIPH = Carotid Siphon PCA = Posterior Cerebral Artery   VERT = Verterbral Artery                   Normal MCA = 62+\-12 ACA = 50+\-12 PCA = 42+\-23   ^^^ limited left temp window is due to bandage location. JE 05/15/2018

## 2018-05-15 NOTE — Plan of Care (Addendum)
Pt extubated to 4L [place], lung sounds diminished. Not following commands, very lethargic. Speech to come by to evaluate swallowing. PT/OT orders in. Will continue to monitor patient.  75 mL Fentanyl wasted with Beverly Gust RN

## 2018-05-15 NOTE — Procedures (Signed)
Cortrak  Tube Type:  Cortrak - 43 inches Tube Location:  Left nare Initial Placement:  Stomach Secured by: Bridle Technique Used to Measure Tube Placement:  Documented cm marking at nare/ corner of mouth Cortrak Secured At:  85 cm    Cortrak Tube Team Note:  Consult received to place a Cortrak feeding tube.   No x-ray is required. RN may begin using tube.   If the tube becomes dislodged please keep the tube and contact the Cortrak team at [email or web address] (password [email or web address]) for replacement.  If after hours and replacement cannot be delayed, place a NG tube and confirm placement with an abdominal x-ray.   Koleen Distance MS, RD, LDN Pager #- [phone] Office#- [phone] After Hours Pager: [phone]

## 2018-05-16 DIAGNOSIS — J9600 Acute respiratory failure, unspecified whether with hypoxia or hypercapnia: Secondary | ICD-10-CM

## 2018-05-16 LAB — GLUCOSE, CAPILLARY
GLUCOSE-CAPILLARY: 116 mg/dL — AB (ref 70–99)
GLUCOSE-CAPILLARY: 117 mg/dL — AB (ref 70–99)
Glucose-Capillary: 104 mg/dL — ABNORMAL HIGH (ref 70–99)
Glucose-Capillary: 107 mg/dL — ABNORMAL HIGH (ref 70–99)
Glucose-Capillary: 109 mg/dL — ABNORMAL HIGH (ref 70–99)
Glucose-Capillary: 111 mg/dL — ABNORMAL HIGH (ref 70–99)
Glucose-Capillary: 123 mg/dL — ABNORMAL HIGH (ref 70–99)

## 2018-05-16 LAB — CBC
HEMATOCRIT: 33.7 % — AB (ref 36.0–46.0)
HEMOGLOBIN: 10.9 g/dL — AB (ref 12.0–15.0)
MCH: 31.8 pg (ref 26.0–34.0)
MCHC: 32.3 g/dL (ref 30.0–36.0)
MCV: 98.3 fL (ref 80.0–100.0)
Platelets: 261 10*3/uL (ref 150–400)
RBC: 3.43 MIL/uL — AB (ref 3.87–5.11)
RDW: 13.2 % (ref 11.5–15.5)
WBC: 7.9 10*3/uL (ref 4.0–10.5)
nRBC: 0 % (ref 0.0–0.2)

## 2018-05-16 LAB — BASIC METABOLIC PANEL
ANION GAP: 9 (ref 5–15)
BUN: 7 mg/dL (ref 6–20)
CHLORIDE: 109 mmol/L (ref 98–111)
CO2: 22 mmol/L (ref 22–32)
Calcium: 8.9 mg/dL (ref 8.9–10.3)
Creatinine, Ser: 0.45 mg/dL (ref 0.44–1.00)
GFR calc Af Amer: >60 mL/min (ref >60)
GLUCOSE: 149 mg/dL — AB (ref 70–99)
POTASSIUM: 3.5 mmol/L (ref 3.5–5.1)
Sodium: 140 mmol/L (ref 135–145)

## 2018-05-16 NOTE — Progress Notes (Signed)
NAME:  [name] [name], MRN:  [id number], DOB:  03-20-1986, LOS: 7 ADMISSION DATE:  05/09/2018, CONSULTATION DATE:  05/10/2018 REFERRING MD:  Dr. Vertell Limber, CHIEF COMPLAINT:  Post op vent, Clipping   Brief History   32 year old female admitted 11/21 with SAH and taken to OR for clipping 11/22, but remained on the ventilator in the post-operative setting.   Past Medical History  Asthma, Allergies, Back pain, Eczema, HTN, lactose intolerance, teratoma  Significant Hospital Events   11/21 admit with [facility] 11/22 to OR for clipping of A. Com. Aneurysm.  11/27 - extubated  Consults:  Neurosurgery 11/21 >>   Procedures:  ETT 11/21 >>   Significant Diagnostic Tests:  CT head 11/21 > Small volume subarachnoid hemorrhage, likely redistributed into fourth ventricle with early suspected hydrocephalus. 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 Mild luminal irregularity of cerebral arteries concerning for vasospasm. CT Head 11/23 >> lateral ventricles decreased in size, no new bleeding, small amount of residual pneumocephalus.  Small amount of hyperdensity around the left caudate head noted by Dr. Annette Stable, concerning for possible infarct CT head 11/25 >> hypoattenuation Lt anterior cerebral artery territory  Micro Data:  Sputum 11/26 >>  Blood 11/26 >>   Antimicrobials:  Vanc 11/26 >> Zoxyn 11/26 >>   Interim history/subjective:  11/28 - remains extubated x 24h . However, mild tachypnea and unresponsiveness continue. Afebrile with normal wBC  Objective   Blood pressure 138/85, pulse 85, temperature 98.5 F (36.9 C), temperature source Oral, resp. rate (!) 29, height 5\' 10"  (1.778 m), weight 103.3 kg, last menstrual period 05/03/2018, SpO2 100 %, currently breastfeeding.        Intake/Output Summary (Last 24 hours) at 05/16/2018 0954 Last data filed at 05/16/2018 0700 Gross per 24 hour  Intake 3441.66 ml  Output 2275 ml  Net 1166.66 ml   Filed Weights   05/14/18 0500 05/15/18  0500 05/16/18 0500  Weight: 101.5 kg 101.1 kg 103.3 kg    Examination: General Appearance:  OBESE +. Not on vent Head:  Normocephalic, without obvious abnormality, atraumatic Eyes:  PERRL - yes, conjunctiva/corneas - clear     Ears:  Normal external ear canals, both ears Nose:  G tube - no Throat:  ETT TUBE - no , OG tube - no Neck:  Supple,  No enlargement/tenderness/nodules Lungs: Clear to auscultation bilaterally, RR 27, NOT PARADOXICAL Heart:  S1 and S2 normal, no murmur, CVP - no.  Pressors - no Abdomen:  Soft, no masses, no organomegaly Genitalia / Rectal:  Not done Extremities:  Extremities- intact Skin:  ntact in exposed areas . Neurologic:  RASS -2 equivalent. - off sedation     LABS    PULMONARY Recent Labs  Lab 05/10/18 2315  PHART 7.332*  PCO2ART 41.8  PO2ART 144.0*  HCO3 22.2  TCO2 23  O2SAT 99.0    CBC Recent Labs  Lab 05/14/18 0553 05/15/18 0615 05/16/18 0309  HGB 10.6* 10.7* 10.9*  HCT 32.9* 33.4* 33.7*  WBC 9.2 7.7 7.9  PLT 190 202 261    COAGULATION Recent Labs  Lab 05/09/18 1948  INR 1.00    CARDIAC  No results for input(s): TROPONINI in the last 168 hours. No results for input(s): PROBNP in the last 168 hours.   CHEMISTRY Recent Labs  Lab 05/10/18 0001 05/12/18 1229 05/13/18 0708 05/13/18 1055 05/13/18 1633 05/14/18 0553 05/14/18 1546 05/15/18 0615 05/16/18 0309  NA 138 137 141  --   --  143  --  143 140  K 3.1* 3.7 3.3*  --   --  3.2*  --  4.3 3.5  CL 111 107 111  --   --  112*  --  112* 109  CO2 20* 20* 24  --   --  23  --  23 22  GLUCOSE 147* 108* 106*  --   --  116*  --  138* 149*  BUN <5* <5* 7  --   --  10  --  12 7  CREATININE 0.70 0.56 0.61  --   --  0.54  --  0.54 0.45  CALCIUM 8.3* 8.2* 8.4*  --   --  8.6*  --  8.8* 8.9  MG 1.7  --   --  1.8 1.9 1.7 2.0 1.9  --   PHOS 2.3*  --   --  3.2 3.3 3.1 3.1  --   --    Estimated Creatinine Clearance: 131.3 mL/min (by C-G formula based on SCr of 0.45  mg/dL).   LIVER Recent Labs  Lab 05/09/18 1948  AST 22  ALT 24  ALKPHOS 52  BILITOT 0.8  PROT 6.7  ALBUMIN 3.5  INR 1.00     INFECTIOUS No results for input(s): LATICACIDVEN, PROCALCITON in the last 168 hours.   ENDOCRINE CBG (last 3)  Recent Labs    05/16/18 0020 05/16/18 0428 05/16/18 0835  GLUCAP 116* 107* 109*         IMAGING x48h  - image(s) personally visualized  -   highlighted in bold Dg Chest Port 1 View  Result Date: 05/15/2018 CLINICAL DATA:  Respiratory failure EXAM: PORTABLE CHEST 1 VIEW COMPARISON:  05/14/2018 FINDINGS: Cardiac shadow is stable. Endotracheal tube and nasogastric catheter are again noted and stable. Persistent left basilar infiltrate/atelectasis is noted although improved when compared with the prior exam with near complete resolution. Continued follow-up is recommended. IMPRESSION: Near complete resolution of left basilar infiltrative density. Electronically Signed   By: Inez Catalina M.D.   On: 05/15/2018 08:14   Vas [name] Transcranial Doppler  Result Date: 05/15/2018  Transcranial Doppler Indications: Subarachnoid hemorrhage.  Limitations for diagnostic windows: Unable to insonate left transtemporal window. Due to bandage location. Performing Technologist: Landry Mellow RDMS, RVT  Examination Guidelines: A complete evaluation includes B-mode imaging, spectral Doppler, color Doppler, and power Doppler as needed of all accessible portions of each vessel. Bilateral testing is considered an integral part of a complete examination. Limited examinations for reoccurring indications may be performed as noted.  +----------+-------------+----------+-----------+--------------+ RIGHT TCD Right VM (cm)Depth (cm)Pulsatility   Comment     +----------+-------------+----------+-----------+--------------+ MCA          101.00                  0.7                   +----------+-------------+----------+-----------+--------------+ ACA          -47.00                  1.07                   +----------+-------------+----------+-----------+--------------+ Term ICA                                    not visualized +----------+-------------+----------+-----------+--------------+ PCA           25.00  0.92                   +----------+-------------+----------+-----------+--------------+ Opthalmic     20.00                 1.69                   +----------+-------------+----------+-----------+--------------+ ICA siphon    52.00                 1.39                   +----------+-------------+----------+-----------+--------------+ Vertebral    -47.00                 1.02                   +----------+-------------+----------+-----------+--------------+  +----------+------------+----------+-----------+-------+ LEFT TCD  Left VM (cm)Depth (cm)PulsatilityComment +----------+------------+----------+-----------+-------+ MCA          28.00                 0.78            +----------+------------+----------+-----------+-------+ Opthalmic    31.00                 1.58            +----------+------------+----------+-----------+-------+ ICA siphon   53.00                 0.85            +----------+------------+----------+-----------+-------+ Vertebral    -57.00                0.89            +----------+------------+----------+-----------+-------+  +------------+-------+-------+             VM cm/sComment +------------+-------+-------+ Prox Basilar-32.00         +------------+-------+-------+ Summary:  Mean flow velocity in the right middle cerebral artery suggestive of mild vasospasm. Normal mean flow velocities and remaining identified vessels on anterior and posterior cerebral circulation. *See table(s) above for measurements and observations.  Diagnosing physician: Antony Contras MD Electronically signed by Antony Contras MD on 05/15/2018 at 1:06:54 PM.    Final       Assessment & Plan:    Subarachnoid hemorrhage. 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  11/28 - not fully awake  Plan - continue nimodipine - PT/OT/Speech therapy after extubation - neurosurgery following  Acute respiratory failure with compromised airway.  11/28 - s/p extubation yesterday. Some tachypnea and drowsy but compensatin and protecting airway  Plan -o2  - monitor   Fever with concern for HCAP.   - afebrile  Plan - day 3 of abx - f/u cultures from 11/26 - primary service to track and  Adjust antibiotics  Hx of allergies, asthma. Plan - singulair, claritin with prn albuterol  Hypertension. Plan - goal SBP < 180 - continue norvasc  Hypokalemia. Hypomagnesemia. Plan - replace as needed  Anemia of critical illness. Plan - f/u CBC - transfuse for Hb < 7  Diarrhea. Plan: - flexiseal placed  Best practice:  Diet: tube feeds DVT prophylaxis: SCDs GI prophylaxis: Protonix Mobility: Bed rest Code Status: full Family Communication:  None at bedside 05/16/2018   Ccm will sign off     SIGNATURE    Dr. Brand Males, M.D., F.C.C.P,  Pulmonary and Critical Care Medicine Staff Physician, [facility] Director - Interstitial Lung Disease  Program  [facility] [place], [place], [address]  Pager: [phone], If no answer or between  15:00h - 7:00h: call [phone] Telephone: [phone]  9:54 AM 05/16/2018

## 2018-05-16 NOTE — Progress Notes (Signed)
Subjective: Patient reports patient nonverbal  Objective: Vital signs in last 24 hours: Temp:  [98.1 F (36.7 C)-99.6 F (37.6 C)] 99 F (37.2 C) (11/28 0400) Pulse Rate:  [60-105] 85 (11/28 0700) Resp:  [19-37] 29 (11/28 0700) BP: (123-157)/(62-97) 138/85 (11/28 0700) SpO2:  [99 %-100 %] 100 % (11/28 0700) FiO2 (%):  [30 %] 30 % (11/27 0835) Weight:  [103.3 kg] 103.3 kg (11/28 0500)  Intake/Output from previous day: 11/27 0701 - 11/28 0700 In: 3532.2 [I.V.:2001.7; NG/GT:780; IV Piggyback:750.5] Out: 2275 [Urine:1875; Stool:400] Intake/Output this shift: No intake/output data recorded.  awake alert sluggish to follow commands on the left but moves left side well right hemiparesis  Lab Results: Recent Labs    05/15/18 0615 05/16/18 0309  WBC 7.7 7.9  HGB 10.7* 10.9*  HCT 33.4* 33.7*  PLT 202 261   BMET Recent Labs    05/15/18 0615 05/16/18 0309  NA 143 140  K 4.3 3.5  CL 112* 109  CO2 23 22  GLUCOSE 138* 149*  BUN 12 7  CREATININE 0.54 0.45  CALCIUM 8.8* 8.9    Studies/Results: Dg Chest Port 1 View  Result Date: 05/15/2018 CLINICAL DATA:  Respiratory failure EXAM: PORTABLE CHEST 1 VIEW COMPARISON:  05/14/2018 FINDINGS: Cardiac shadow is stable. Endotracheal tube and nasogastric catheter are again noted and stable. Persistent left basilar infiltrate/atelectasis is noted although improved when compared with the prior exam with near complete resolution. Continued follow-up is recommended. IMPRESSION: Near complete resolution of left basilar infiltrative density. Electronically Signed   By: Inez Catalina M.D.   On: 05/15/2018 08:14   Vas [name] Transcranial Doppler  Result Date: 05/15/2018  Transcranial Doppler Indications: Subarachnoid hemorrhage.  Limitations for diagnostic windows: Unable to insonate left transtemporal window. Due to bandage location. Performing Technologist: Landry Mellow RDMS, RVT  Examination Guidelines: A complete evaluation includes B-mode  imaging, spectral Doppler, color Doppler, and power Doppler as needed of all accessible portions of each vessel. Bilateral testing is considered an integral part of a complete examination. Limited examinations for reoccurring indications may be performed as noted.  +----------+-------------+----------+-----------+--------------+ RIGHT TCD Right VM (cm)Depth (cm)Pulsatility   Comment     +----------+-------------+----------+-----------+--------------+ MCA          101.00                  0.7                   +----------+-------------+----------+-----------+--------------+ ACA          -47.00                 1.07                   +----------+-------------+----------+-----------+--------------+ Term ICA                                    not visualized +----------+-------------+----------+-----------+--------------+ PCA           25.00                 0.92                   +----------+-------------+----------+-----------+--------------+ Opthalmic     20.00                 1.69                   +----------+-------------+----------+-----------+--------------+ ICA  siphon    52.00                 1.39                   +----------+-------------+----------+-----------+--------------+ Vertebral    -47.00                 1.02                   +----------+-------------+----------+-----------+--------------+  +----------+------------+----------+-----------+-------+ LEFT TCD  Left VM (cm)Depth (cm)PulsatilityComment +----------+------------+----------+-----------+-------+ MCA          28.00                 0.78            +----------+------------+----------+-----------+-------+ Opthalmic    31.00                 1.58            +----------+------------+----------+-----------+-------+ ICA siphon   53.00                 0.85            +----------+------------+----------+-----------+-------+ Vertebral    -57.00                0.89             +----------+------------+----------+-----------+-------+  +------------+-------+-------+             VM cm/sComment +------------+-------+-------+ Prox Basilar-32.00         +------------+-------+-------+ Summary:  Mean flow velocity in the right middle cerebral artery suggestive of mild vasospasm. Normal mean flow velocities and remaining identified vessels on anterior and posterior cerebral circulation. *See table(s) above for measurements and observations.  Diagnosing physician: Antony Contras MD Electronically signed by Antony Contras MD on 05/15/2018 at 1:06:54 PM.    Final     Assessment/Plan: Continue observation tolerating extubation wellhopefully can mobilized today with physical and occupational therapy  LOS: 7 days     Alyia Lacerte P 05/16/2018, 8:29 AM

## 2018-05-16 NOTE — Progress Notes (Signed)
Patient's respirations have spiked to the mid and upper 30's despite interventions along side a short occurrence of tachycardia. Neurosurgery has been made aware. MD verbal order to continue monitoring the patient. No immediate change to care. Will monitor for persistence into the morning.

## 2018-05-17 ENCOUNTER — Inpatient Hospital Stay (HOSPITAL_COMMUNITY): Payer: Medicaid Other

## 2018-05-17 DIAGNOSIS — R509 Fever, unspecified: Secondary | ICD-10-CM

## 2018-05-17 DIAGNOSIS — I609 Nontraumatic subarachnoid hemorrhage, unspecified: Secondary | ICD-10-CM

## 2018-05-17 DIAGNOSIS — R197 Diarrhea, unspecified: Secondary | ICD-10-CM

## 2018-05-17 LAB — CBC WITH DIFFERENTIAL/PLATELET
ABS IMMATURE GRANULOCYTES: 0.02 10*3/uL (ref 0.00–0.07)
BASOS ABS: 0 10*3/uL (ref 0.0–0.1)
Basophils Relative: 0 %
Eosinophils Absolute: 0.2 10*3/uL (ref 0.0–0.5)
Eosinophils Relative: 3 %
HCT: 32.3 % — ABNORMAL LOW (ref 36.0–46.0)
Hemoglobin: 10.1 g/dL — ABNORMAL LOW (ref 12.0–15.0)
IMMATURE GRANULOCYTES: 0 %
Lymphocytes Relative: 28 %
Lymphs Abs: 1.7 10*3/uL (ref 0.7–4.0)
MCH: 30.9 pg (ref 26.0–34.0)
MCHC: 31.3 g/dL (ref 30.0–36.0)
MCV: 98.8 fL (ref 80.0–100.0)
Monocytes Absolute: 0.5 10*3/uL (ref 0.1–1.0)
Monocytes Relative: 9 %
NRBC: 0 % (ref 0.0–0.2)
Neutro Abs: 3.6 10*3/uL (ref 1.7–7.7)
Neutrophils Relative %: 60 %
Platelets: 320 10*3/uL (ref 150–400)
RBC: 3.27 MIL/uL — ABNORMAL LOW (ref 3.87–5.11)
RDW: 12.9 % (ref 11.5–15.5)
WBC: 6.1 10*3/uL (ref 4.0–10.5)

## 2018-05-17 LAB — CULTURE, RESPIRATORY W GRAM STAIN: Special Requests: NORMAL

## 2018-05-17 LAB — BASIC METABOLIC PANEL
Anion gap: 6 (ref 5–15)
BUN: 10 mg/dL (ref 6–20)
CO2: 24 mmol/L (ref 22–32)
Calcium: 8.9 mg/dL (ref 8.9–10.3)
Chloride: 106 mmol/L (ref 98–111)
Creatinine, Ser: 0.52 mg/dL (ref 0.44–1.00)
GFR calc Af Amer: >60 mL/min (ref >60)
GFR calc non Af Amer: >60 mL/min (ref >60)
Glucose, Bld: 124 mg/dL — ABNORMAL HIGH (ref 70–99)
POTASSIUM: 3.9 mmol/L (ref 3.5–5.1)
Sodium: 136 mmol/L (ref 135–145)

## 2018-05-17 LAB — C DIFFICILE QUICK SCREEN W PCR REFLEX
C Diff antigen: POSITIVE — AB
C Diff toxin: NEGATIVE

## 2018-05-17 LAB — CULTURE, RESPIRATORY

## 2018-05-17 LAB — MAGNESIUM: Magnesium: 1.8 mg/dL (ref 1.7–2.4)

## 2018-05-17 LAB — CLOSTRIDIUM DIFFICILE BY PCR, REFLEXED: Toxigenic C. Difficile by PCR: NEGATIVE

## 2018-05-17 LAB — GLUCOSE, CAPILLARY
GLUCOSE-CAPILLARY: 135 mg/dL — AB (ref 70–99)
Glucose-Capillary: 120 mg/dL — ABNORMAL HIGH (ref 70–99)
Glucose-Capillary: 124 mg/dL — ABNORMAL HIGH (ref 70–99)
Glucose-Capillary: 136 mg/dL — ABNORMAL HIGH (ref 70–99)

## 2018-05-17 LAB — PHOSPHORUS: Phosphorus: 4.6 mg/dL (ref 2.5–4.6)

## 2018-05-17 MED ORDER — FAMOTIDINE 40 MG/5ML PO SUSR
20.0000 mg | Freq: Every day | ORAL | Status: DC
  Administered 2018-05-17 – 2018-05-20 (×4): 20 mg
  Filled 2018-05-17 (×4): qty 2.5

## 2018-05-17 MED ORDER — POTASSIUM CHLORIDE 20 MEQ/15ML (10%) PO SOLN
40.0000 meq | Freq: Two times a day (BID) | ORAL | Status: DC
  Administered 2018-05-17 – 2018-05-18 (×4): 40 meq
  Filled 2018-05-17 (×4): qty 30

## 2018-05-17 NOTE — Plan of Care (Signed)
  Problem: Nutrition: Goal: Dietary intake will improve Outcome: Progressing   Problem: Skin Integrity: Goal: Risk for impaired skin integrity will decrease Outcome: Progressing   Problem: Activity: Goal: Risk for activity intolerance will decrease Outcome: Progressing

## 2018-05-17 NOTE — Evaluation (Addendum)
Clinical/Bedside Swallow Evaluation Patient Details  Name: Jackee Glasner MRN: [id number] Date of Birth: [birth year]  Today's Date: 05/17/2018 Time:        Past Medical History:  Past Medical History:  Diagnosis Date  . Asthma, allergic    uses inhaler prn - infrequently  . Bilateral thoracic back pain 09/13/2015  . Eczema   . Hypertension   . Lactose intolerance   . Mature cystic teratoma of ovary   . Mood swings 06/01/2015  . Seasonal allergies    Past Surgical History:  Past Surgical History:  Procedure Laterality Date  . 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 RADIOLOGY WITH ANESTHESIA N/A 05/10/2018   Procedure: EMBOLIZATION OF ANEURSYM;  Surgeon: Consuella Lose, MD;  Location: [facility];  Service: Radiology;  Laterality: N/A;  . TUBAL LIGATION N/A 12/15/2016   Procedure: POST PARTUM TUBAL LIGATION;  Surgeon: Mora Bellman, MD;  Location: [facility];  Service: Gynecology;  Laterality: N/A;  . WISDOM TOOTH EXTRACTION     HPI:  32 year old female admitted 11/21 with SAH and taken to OR for clipping 11/22, but remained on the ventilator in the post-operative setting. Intubated 11/21-11/27. 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 CXR shows Left basilar atelectasis with associated effusion.   Assessment / Plan / Recommendation Clinical Impression  With visual tactile and verbal cues pt is able to participate in self feeding with adequate oral response and timely oral transit and swallow response dispite inability to follow any oral motor commands. Grimace with initial swallow and pt affirmative response (thumbs up) indicate sore throat. This combined with immediate dysphonic cough after sip of water suggests an acute dysphagia related to prolonged intubation,  though suspected apraxia may also impact ability to functionally consume PO. Given otherwise very automatic, strong and appropriate oropharyngeal response, hopeful that with time pt may progress to normal swallowing. Will pursue objective swallow eval Monday to potentially initaite modified diet at the beginning of the week.  SLP Visit Diagnosis: Apraxia (R48.2)    Aspiration Risk       Diet Recommendation          Other  Recommendations     Follow up Recommendations Inpatient Rehab      Frequency and Duration min 2x/week          Prognosis        Swallow Study   General HPI: 32 year old female admitted 11/21 with SAH and taken to OR for clipping 11/22, but remained on the ventilator in the post-operative setting. Intubated 11/21-11/27. 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 CXR shows Left basilar atelectasis with associated effusion. Type of Study: Bedside Swallow Evaluation Previous Swallow Assessment: none Diet Prior to this Study: NPO;NG Tube Temperature Spikes Noted: No Respiratory Status: Room air History of Recent Intubation: Yes Length of Intubations (days): 7 days Date extubated: 05/15/18 Behavior/Cognition: Alert;Cooperative;Requires cueing Oral Cavity Assessment: Within Functional Limits Oral Care Completed by SLP: No Oral Cavity - Dentition: Adequate natural dentition Vision: Functional for self-feeding Self-Feeding Abilities: Needs assist;Needs set up Patient Positioning: Upright in bed Baseline Vocal Quality: Not observed Volitional Cough: Cognitively unable to elicit Volitional Swallow: Unable to elicit    Oral/Motor/Sensory Function Overall Oral Motor/Sensory Function: Other (comment)(unable to follow oral motor commands, good strength with PO)   Ice Chips Ice  chips: Within functional limits Presentation: Spoon   Thin Liquid Thin Liquid: Impaired Pharyngeal  Phase Impairments: Cough - Immediate     Nectar Thick Nectar Thick Liquid: Not tested   Honey Thick Honey Thick Liquid: Not tested   Puree Puree: Impaired Presentation: Self Fed;Spoon Pharyngeal Phase Impairments: Throat Clearing - Delayed   Solid     Solid: Not tested     Herbie Baltimore, MA CCC-SLP  Acute Rehabilitation Services Pager [phone] Office [phone]   Jessy Calixte, [name] 05/17/2018,11:55 AM

## 2018-05-17 NOTE — Progress Notes (Signed)
NAME:  [name] [name], MRN:  [id number], DOB:  1986/04/05, LOS: 8 ADMISSION DATE:  05/09/2018, CONSULTATION DATE:  05/10/2018 REFERRING MD:  Dr. Vertell Limber, CHIEF COMPLAINT:  Post op vent, Clipping   Brief History   32 year old female admitted 11/21 with SAH and taken to OR for clipping 11/22, but remained on the ventilator in the post-operative setting.   Past Medical History  Asthma, Allergies, Back pain, Eczema, HTN, lactose intolerance, teratoma  Significant Hospital Events   11/21 admit with [facility] 11/22 to OR for clipping of A. Com. Aneurysm.  11/27 extubated  Consults:  Neurosurgery 11/21 >>   Procedures:  ETT 11/21 >> 11/27  Significant Diagnostic Tests:  CT head 11/21 > Small volume subarachnoid hemorrhage, likely redistributed into fourth ventricle with early suspected hydrocephalus. 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 Mild luminal irregularity of cerebral arteries concerning for vasospasm. CT Head 11/23 >> lateral ventricles decreased in size, no new bleeding, small amount of residual pneumocephalus.  Small amount of hyperdensity around the left caudate head noted by Dr. Annette Stable, concerning for possible infarct CT head 11/25 >> hypoattenuation Lt anterior cerebral artery territory  Micro Data:  Sputum 11/26 >>  Blood 11/26 >>  C diff 11/29 >>  Antimicrobials:  Vanc 11/26 >> 11/29 Cefepime 11/26 >>   Interim history/subjective:  Still having intermittent fever, and diarrhea.  Objective   Blood pressure (!) 144/88, pulse 81, temperature 99.2 F (37.3 C), temperature source Axillary, resp. rate (!) 27, height 5\' 10"  (1.778 m), weight 103.3 kg, last menstrual period 05/03/2018, SpO2 100 %, currently breastfeeding.        Intake/Output Summary (Last 24 hours) at 05/17/2018 0824 Last data filed at 05/17/2018 0800 Gross per 24 hour  Intake 4196.16 ml  Output 2250 ml  Net 1946.16 ml   Filed Weights   05/14/18 0500 05/15/18 0500 05/16/18 0500  Weight:  101.5 kg 101.1 kg 103.3 kg     General - alert Eyes - pupils reactive ENT - no sinus tenderness, no stridor, non verbal Cardiac - regular rate/rhythm, no murmur Chest - equal breath sounds b/l, no wheezing or rales Abdomen - soft, non tender, + bowel sounds Extremities - 1+ edema Skin - no rashes Neuro - moves Lt side, follows simple commands   Assessment & Plan:   Subarachnoid hemorrhage. 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 Plan - nimodipine - PT/OT/Speech therapy - neurosurgery following  Acute respiratory failure with compromised airway. Plan - extubated 11/27  Fever with concern for HCAP. Diarrhea. Plan - day 4/7 of cefepime - d/c vancomycin - check stool for C diff PCR - f/u cultures  Hx of allergies, asthma. Plan - singulair, claritin, prn albuterol  Hypertension. Plan - goal SBP < 180 - continue norvasc  Hypokalemia. Hypomagnesemia. Plan - replace as needed  Anemia of critical illness. Plan - f/u CBC - transfuse for Hb < 7  Best practice:  Diet: tube feeds DVT prophylaxis: SCDs GI prophylaxis: Pepcid Mobility: Bed rest Code Status: full Family Communication: no family at bedside  PCCM will follow intermittently for medical management while she is still in ICU.  If additional medical assistance needed after she transfers out of ICU, then recommend consulting [facility].  Labs:   CMP Latest Ref Rng & Units 05/17/2018 05/16/2018 05/15/2018  Glucose 70 - 99 mg/dL 124(H) 149(H) 138(H)  BUN 6 - 20 mg/dL 10 7 12   Creatinine 0.44 - 1.00 mg/dL 0.52 0.45  0.54  Sodium 135 - 145 mmol/L 136 140 143  Potassium 3.5 - 5.1 mmol/L 3.9 3.5 4.3  Chloride 98 - 111 mmol/L 106 109 112(H)  CO2 22 - 32 mmol/L 24 22 23   Calcium 8.9 - 10.3 mg/dL 8.9 8.9 8.8(L)  Total Protein 6.5 - 8.1 g/dL - - -  Total Bilirubin 0.3 - 1.2 mg/dL - - -  Alkaline Phos 38 - 126 U/L - - -  AST 15 - 41 U/L - - -  ALT 0 - 44 U/L - - -   CBC Latest Ref  Rng & Units 05/17/2018 05/16/2018 05/15/2018  WBC 4.0 - 10.5 K/uL 6.1 7.9 7.7  Hemoglobin 12.0 - 15.0 g/dL 10.1(L) 10.9(L) 10.7(L)  Hematocrit 36.0 - 46.0 % 32.3(L) 33.7(L) 33.4(L)  Platelets 150 - 400 K/uL 320 261 202   CBG (last 3)  Recent Labs    05/16/18 2336 05/17/18 0351 05/17/18 [place], MD [place] 05/17/2018, 8:30 AM

## 2018-05-17 NOTE — Progress Notes (Signed)
Subjective: Patient reports patient remains nonverbal awake alert  Objective: Vital signs in last 24 hours: Temp:  [98.5 F (36.9 C)-100.7 F (38.2 C)] 99.2 F (37.3 C) (11/29 0400) Pulse Rate:  [64-103] 81 (11/29 0700) Resp:  [20-36] 27 (11/29 0700) BP: (120-156)/(68-118) 144/88 (11/29 0700) SpO2:  [98 %-100 %] 100 % (11/29 0700)  Intake/Output from previous day: 11/28 0701 - 11/29 0700 In: 3814.4 [I.V.:1474.6; NG/GT:1650; IV Piggyback:649.9] Out: 2250 [Urine:2050; Stool:200] Intake/Output this shift: No intake/output data recorded.  awake alert follows commands on the left right hemiparesis  Lab Results: Recent Labs    05/16/18 0309 05/17/18 0327  WBC 7.9 6.1  HGB 10.9* 10.1*  HCT 33.7* 32.3*  PLT 261 320   BMET Recent Labs    05/16/18 0309 05/17/18 0327  NA 140 136  K 3.5 3.9  CL 109 106  CO2 22 24  GLUCOSE 149* 124*  BUN 7 10  CREATININE 0.45 0.52  CALCIUM 8.9 8.9    Studies/Results: Vas [name] Transcranial Doppler  Result Date: 05/15/2018  Transcranial Doppler Indications: Subarachnoid hemorrhage.  Limitations for diagnostic windows: Unable to insonate left transtemporal window. Due to bandage location. Performing Technologist: Landry Mellow RDMS, RVT  Examination Guidelines: A complete evaluation includes B-mode imaging, spectral Doppler, color Doppler, and power Doppler as needed of all accessible portions of each vessel. Bilateral testing is considered an integral part of a complete examination. Limited examinations for reoccurring indications may be performed as noted.  +----------+-------------+----------+-----------+--------------+ RIGHT TCD Right VM (cm)Depth (cm)Pulsatility   Comment     +----------+-------------+----------+-----------+--------------+ MCA          101.00                  0.7                   +----------+-------------+----------+-----------+--------------+ ACA          -47.00                 1.07                    +----------+-------------+----------+-----------+--------------+ Term ICA                                    not visualized +----------+-------------+----------+-----------+--------------+ PCA           25.00                 0.92                   +----------+-------------+----------+-----------+--------------+ Opthalmic     20.00                 1.69                   +----------+-------------+----------+-----------+--------------+ ICA siphon    52.00                 1.39                   +----------+-------------+----------+-----------+--------------+ Vertebral    -47.00                 1.02                   +----------+-------------+----------+-----------+--------------+  +----------+------------+----------+-----------+-------+ LEFT TCD  Left VM (cm)Depth (cm)PulsatilityComment +----------+------------+----------+-----------+-------+ MCA          28.00  0.78            +----------+------------+----------+-----------+-------+ Opthalmic    31.00                 1.58            +----------+------------+----------+-----------+-------+ ICA siphon   53.00                 0.85            +----------+------------+----------+-----------+-------+ Vertebral    -57.00                0.89            +----------+------------+----------+-----------+-------+  +------------+-------+-------+             VM cm/sComment +------------+-------+-------+ Prox Basilar-32.00         +------------+-------+-------+ Summary:  Mean flow velocity in the right middle cerebral artery suggestive of mild vasospasm. Normal mean flow velocities and remaining identified vessels on anterior and posterior cerebral circulation. *See table(s) above for measurements and observations.  Diagnosing physician: Antony Contras MD Electronically signed by Antony Contras MD on 05/15/2018 at 1:06:54 PM.    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  LOS: 8 days     Davita Sublett P 05/17/2018, 7:33 AM

## 2018-05-17 NOTE — Evaluation (Signed)
Speech Language Pathology Evaluation Patient Details Name: [name] [name] MRN: [id number] DOB: 06/12/1986 Today's Date: 05/17/2018 Time:  -     Problem List:  Patient Active Problem List   Diagnosis Date Noted  . 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 Endotracheal tube present   . Brain aneurysm   . Subarachnoid hemorrhage ([place]) 05/09/2018  . Gastroesophageal reflux disease without esophagitis 10/09/2016  . BMI 31.0-31.9,adult 07/20/2016  . Obesity in pregnancy, antepartum 07/20/2016  . Tobacco abuse 06/01/2015  . Marijuana use 06/23/2014   Past Medical History:  Past Medical History:  Diagnosis Date  . Asthma, allergic    uses inhaler prn - infrequently  . Bilateral thoracic back pain 09/13/2015  . Eczema   . Hypertension   . Lactose intolerance   . Mature cystic teratoma of ovary   . Mood swings 06/01/2015  . Seasonal allergies    Past Surgical History:  Past Surgical History:  Procedure Laterality Date  . CRANIOTOMY N/A 05/10/2018   Procedure: 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;  Surgeon: Consuella Lose, MD;  Location: [facility];  Service: Neurosurgery;  Laterality: N/A;  . RADIOLOGY WITH ANESTHESIA N/A 05/10/2018   Procedure: EMBOLIZATION OF ANEURSYM;  Surgeon: Consuella Lose, MD;  Location: [facility];  Service: Radiology;  Laterality: N/A;  . TUBAL LIGATION N/A 12/15/2016   Procedure: POST PARTUM TUBAL LIGATION;  Surgeon: Mora Bellman, MD;  Location: [facility];  Service: Gynecology;  Laterality: N/A;  . WISDOM TOOTH EXTRACTION     HPI:  32 year old female admitted 11/21 with SAH and taken to OR for clipping 11/22, but remained on the ventilator in the post-operative setting. Intubated 11/21-11/27. 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 CXR shows Left basilar atelectasis with associated effusion.   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 Pt demonstrates potential for intact language comprehension; She is able to demonstrate understanding of language via accuracy with thumb up and down to basic and complex questions, though thumbs up/down may not be totally reliable for wants/needs if she has difficulty rapidly alernating between motions. She is able to identify objects in picutres with Y/N repsonses, but seemsto have difficulty lifting arm and directing finger to point. She cannot participate in oral motor movement on command, but spontaneous movement with PO trials looks very good and symmetrical. Phonation heard with coughing, but not in response to any command or attempt with automatic language task/singing. Expect pt to gradually progress. Will attempt alternate methods of communication as endurance improves. Recommend CIR at next level of care.     SLP Assessment  SLP Recommendation/Assessment: Patient needs continued Speech Lanaguage Pathology Services SLP Visit Diagnosis: Apraxia (R48.2)    Follow Up Recommendations  Inpatient Rehab    Frequency and Duration min 2x/week  2 weeks      SLP Evaluation Cognition  Overall Cognitive Status: Impaired/Different from baseline Arousal/Alertness: Awake/alert Orientation Level: (Unable to assess) Attention: Focused;Sustained Focused Attention: Appears intact Sustained Attention: Impaired Sustained Attention Impairment: Verbal complex;Functional complex Problem Solving: Impaired Problem Solving Impairment: Verbal basic;Functional basic Executive Function: Initiating       Comprehension  Auditory Comprehension Overall Auditory Comprehension: Impaired Yes/No Questions: Impaired Basic Biographical Questions: 51-75% accurate Basic Immediate Environment Questions: 50-74% accurate Commands: Impaired One Step Basic Commands: 50-74%  accurate Conversation: Simple Reading Comprehension Reading Status: Not tested  Expression Verbal Expression Overall Verbal Expression: Impaired Initiation: Impaired Automatic Speech: (not able) Written Expression Dominant Hand: Right   Oral / Motor  Oral Motor/Sensory Function Overall Oral Motor/Sensory Function: Other (comment)(unable to follow oral motor commands, good strength with PO) Motor Speech Overall Motor Speech: Impaired Respiration: Within functional limits Phonation: (heard with cough) Articulation: (uanble)   GO                   Herbie Baltimore, MA CCC-SLP  Acute Rehabilitation Services Pager [phone] Office [phone]  Lynann Beaver 05/17/2018, 11:47 AM

## 2018-05-17 NOTE — Progress Notes (Signed)
PT Cancellation Note  Patient Details Name: Brycelynn Stampley MRN: [id number] DOB: Sep 20, 1985   Cancelled Treatment:    Reason Eval/Treat Not Completed: Active bedrest order   Sandy Salaam Harmon Bommarito 05/17/2018, 7:24 AM  Elwyn Reach, PT Acute Rehabilitation Services Pager: [phone] Office: [phone]

## 2018-05-17 NOTE — Progress Notes (Addendum)
Transcranial Doppler  Date POD PCO2 HCT BP  MCA ACA PCA OPHT SIPH VERT Basilar  11/27 JE     Right  Left   101  28   -47  *   25  *   20  31   52  53   -47  -57   -32      11/29 JE     Right  Left   88  52   -36  *   19  *   32  23   79  45   *  *   *      12/2 RS     Right  Left                                       12/4 JE      Right  Left   40  *   *  *   *  *   47  38   45  51   -19  -16   -44            Right  Left                                            Right  Left                                            Right  Left                                        MCA = Middle Cerebral Artery      OPHT = Opthalmic Artery     BASILAR = Basilar Artery   ACA = Anterior Cerebral Artery     SIPH = Carotid Siphon PCA = Posterior Cerebral Artery   VERT = Verterbral Artery                   Normal MCA = 62+\-12 ACA = 50+\-12 PCA = 42+\-23   ^^^ limited left temp window is due to bandage location. JE 05/17/2018

## 2018-05-17 NOTE — Progress Notes (Signed)
Pharmacy Antibiotic Note [name] [name] is a 32 y.o. female admitted on 05/09/2018 with aneurysm. On Cefepime for treatment of pneumonia. WBC stable. Tmax 99.2  Plan: 1. Continue Cefepime 2 grams IV every 12 hours  2. Vancomycin stopped 12/2    Height: 5\' 10"  (177.8 cm) Weight: 225 lb 8.5 oz (102.3 kg) IBW/kg (Calculated) : 68.5  Temp (24hrs), Avg:99.6 F (37.6 C), Min:99 F (37.2 C), Max:100.7 F (38.2 C)  Recent Labs  Lab 05/12/18 1400 05/13/18 0708 05/14/18 0553 05/15/18 0615 05/16/18 0309 05/17/18 0327  WBC 11.7*  --  9.2 7.7 7.9 6.1  CREATININE  --  0.61 0.54 0.54 0.45 0.52    Estimated Creatinine Clearance: 130.7 mL/min (by C-G formula based on SCr of 0.52 mg/dL).    Allergies  Allergen Reactions  . Sulfa Antibiotics Swelling    Causes swelling on the face.  . Shellfish Allergy Other (See Comments)    Stomach upset    Antimicrobials this admission: 11/27 Cefepime  >>  11/27 vancomycin >> 11/29  Microbiology results: 11/26 BCx: ngtd 11/26 Sputum: reincubated  11/21 MRSA PCR: negative  Thank you for allowing pharmacy to be a part of this patient's care.  Vincenza Hews, PharmD, BCPS 05/17/2018, 8:36 AM

## 2018-05-18 LAB — GLUCOSE, CAPILLARY
Glucose-Capillary: 125 mg/dL — ABNORMAL HIGH (ref 70–99)
Glucose-Capillary: 109 mg/dL — ABNORMAL HIGH (ref 70–99)
Glucose-Capillary: 102 mg/dL — ABNORMAL HIGH (ref 70–99)
Glucose-Capillary: 109 mg/dL — ABNORMAL HIGH (ref 70–99)
Glucose-Capillary: 111 mg/dL — ABNORMAL HIGH (ref 70–99)
Glucose-Capillary: 104 mg/dL — ABNORMAL HIGH (ref 70–99)

## 2018-05-18 LAB — CBC
HCT: 33.9 % — ABNORMAL LOW (ref 36.0–46.0)
Hemoglobin: 10.7 g/dL — ABNORMAL LOW (ref 12.0–15.0)
MCH: 31.4 pg (ref 26.0–34.0)
MCHC: 31.6 g/dL (ref 30.0–36.0)
MCV: 99.4 fL (ref 80.0–100.0)
Platelets: 349 10*3/uL (ref 150–400)
RBC: 3.41 MIL/uL — ABNORMAL LOW (ref 3.87–5.11)
RDW: 13.2 % (ref 11.5–15.5)
WBC: 7.3 10*3/uL (ref 4.0–10.5)
nRBC: 0 % (ref 0.0–0.2)

## 2018-05-18 LAB — BASIC METABOLIC PANEL
ANION GAP: 11 (ref 5–15)
BUN: 13 mg/dL (ref 6–20)
CO2: 23 mmol/L (ref 22–32)
Calcium: 9.3 mg/dL (ref 8.9–10.3)
Chloride: 105 mmol/L (ref 98–111)
Creatinine, Ser: 0.57 mg/dL (ref 0.44–1.00)
GFR calc Af Amer: >60 mL/min (ref >60)
GFR calc non Af Amer: >60 mL/min (ref >60)
Glucose, Bld: 128 mg/dL — ABNORMAL HIGH (ref 70–99)
Potassium: 4.4 mmol/L (ref 3.5–5.1)
Sodium: 139 mmol/L (ref 135–145)

## 2018-05-18 MED ORDER — ORAL CARE MOUTH RINSE
15.0000 mL | Freq: Two times a day (BID) | OROMUCOSAL | Status: DC
  Administered 2018-05-19 – 2018-05-20 (×4): 15 mL via OROMUCOSAL

## 2018-05-18 MED ORDER — CHLORHEXIDINE GLUCONATE 0.12 % MT SOLN
15.0000 mL | Freq: Two times a day (BID) | OROMUCOSAL | Status: DC
  Administered 2018-05-18 – 2018-05-21 (×6): 15 mL via OROMUCOSAL
  Filled 2018-05-18 (×5): qty 15

## 2018-05-18 NOTE — Progress Notes (Signed)
Patient ID: [name] [name], female   DOB: [birth year], 32 y.o.   MRN: [id number] Vital signs are stable Alert right hemiplegic will follow commands on the left, completely mute at this time Patient can be mobilized as tolerated No signs of vasospasm at this time.

## 2018-05-18 NOTE — Evaluation (Addendum)
Occupational Therapy Evaluation Patient Details Name: [name] [name] MRN: [id number] DOB: Sep 05, 1985 Today's Date: 05/18/2018    History of Present Illness Pt is a 32 y/o female  with history of HTN on amlodipine, allergic rhinitis, chronic nasal congestion, eczema is here for evaluation of headache.  Admitted 11/21 with SAH and taken to OR for clipping 11/22, but remained on the ventilator in the post-operative setting. Intubated 11/21-11/27. 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 CXR shows Left basilar atelectasis with associated effusion.    Clinical Impression   PTA patient independent, caring for her 2 young children.  Patient currently admitted for above and limited by problem list below, including R hemiparesis, impaired balance, impaired cognition/communication, and decreased activity tolerance. Patient able to respond to yes/no questions using gestures (thumbs up) given increased time, unclear accuracy. Patient able to initiate and follow most commands with L UE, but requires increased time.  Patient currently requires max assist for grooming seated, max- total assist for UB ADLs, total assist +2 for LB ADLs and total assist +2 for squat pivot transfers.  Patient will benefit continued OT services while admitted and believe she will best benefit from CIR level therapies at dc in order to maximize independence and optimize return to PLOF.  Will continue to follow.   I have discussed the patient's current level of function related to ADLs and mobility with the patient and aunt/uncle. They acknowledge understanding of this and do not feel the patient would be able to have their care needs met at home.  They are interested in post-acute rehab in an inpatient setting.    Follow Up Recommendations  CIR    Equipment Recommendations  Other (comment)(TBD at next venue of care)    Recommendations for Other Services Rehab consult      Precautions / Restrictions Precautions Precautions: Fall Restrictions Weight Bearing Restrictions: No      Mobility Bed Mobility Overal bed mobility: Needs Assistance Bed Mobility: Rolling;Sidelying to Sit Rolling: Max assist Sidelying to sit: Max assist;+2 for physical assistance       General bed mobility comments: patient rolling toward L side, able to use L UE to assist with transition to sitting given max cueing and physical assist  Transfers Overall transfer level: Needs assistance   Transfers: Squat Pivot Transfers     Squat pivot transfers: Total assist;+2 physical assistance;+2 safety/equipment     General transfer comment: cueing for hand placement and sequencing, requires +2 to pivot towards L side    Balance Overall balance assessment: Needs assistance Sitting-balance support: Feet supported;Single extremity supported Sitting balance-Leahy Scale: Poor Sitting balance - Comments: L UE hand support with R lateral and posterior lean, mod assist to maintain static balance  Postural control: Posterior lean;Right lateral lean                                 ADL either performed or assessed with clinical judgement   ADL Overall ADL's : Needs assistance/impaired Eating/Feeding: NPO   Grooming: Maximal assistance;Sitting Grooming Details (indicate cue type and reason): hand over hand to wash face and hands Upper Body Bathing: Maximal assistance;Sitting   Lower Body Bathing: Total assistance;+2 for physical assistance;Sitting/lateral leans   Upper Body Dressing : Total assistance;Sitting   Lower Body Dressing: Total assistance;+2 for physical assistance;Sit to/from stand   Toilet Transfer: Total assistance;+2 for physical assistance;Squat-pivot Toilet Transfer Details (indicate cue type and reason): simulated  to recliner  Toileting- Clothing Manipulation and Hygiene: Total assistance;+2 for physical assistance;Sit to/from stand        Functional mobility during ADLs: Total assistance;Maximal assistance;+2 for physical assistance General ADL Comments: patient limited by R hemiparesis, impaired cognition and communication, and impaired balance      Vision Patient Visual Report: Other (comment)(vision impaired at baseline, per Aunt) Additional Comments: difficult to assess, patient able to scan and reach towards items when directed; continue assessment     Perception Perception Comments: R inattention    Praxis      Pertinent Vitals/Pain Pain Assessment: Faces Faces Pain Scale: No hurt     Hand Dominance Right   Extremity/Trunk Assessment Upper Extremity Assessment Upper Extremity Assessment: RUE deficits/detail RUE Deficits / Details: flaccid, no functional use  RUE Sensation: decreased light touch;decreased proprioception RUE Coordination: decreased fine motor;decreased gross motor   Lower Extremity Assessment Lower Extremity Assessment: Defer to PT evaluation   Cervical / Trunk Assessment Cervical / Trunk Assessment: Other exceptions Cervical / Trunk Exceptions: low tone, L head tiltl   Communication Communication Communication: Receptive difficulties;Expressive difficulties   Cognition Arousal/Alertness: Awake/alert   Overall Cognitive Status: Difficult to assess                                 General Comments: pt able to follow 1 step commands inconsistently, able to use "thumbs up" with increased time   General Comments  aunt and uncle arrived at completion of session; VSS; edema in R UE     Exercises     Shoulder Instructions      Home Living Family/patient expects to be discharged to:: Private residence Living Arrangements: Alone;Children Available Help at Discharge: Family;Friend(s)(to be determined) Type of Home: Apartment Home Access: Stairs to enter Entrance Stairs-Number of Steps: 3 Entrance Stairs-Rails: None Home Layout: Two level Alternate Level  Stairs-Number of Steps: flight Alternate Level Stairs-Rails: Left Bathroom Shower/Tub: Tub/shower unit;Curtain   Bathroom Toilet: Standard         Additional Comments: information provided by pts aunt       Prior Functioning/Environment Level of Independence: Independent        Comments: - drive, 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         OT Problem List: Decreased strength;Decreased activity tolerance;Impaired balance (sitting and/or standing);Impaired vision/perception;Decreased coordination;Decreased cognition;Decreased safety awareness;Decreased knowledge of use of DME or AE;Decreased knowledge of precautions;Impaired sensation;Impaired tone;Impaired UE functional use;Increased edema      OT Treatment/Interventions: Therapeutic exercise;Self-care/ADL training;Neuromuscular education;Energy conservation;DME and/or AE instruction;Manual therapy;Therapeutic activities;Cognitive remediation/compensation;Visual/perceptual remediation/compensation;Patient/family education;Balance training    OT Goals(Current goals can be found in the care plan section) Acute Rehab OT Goals Patient Stated Goal: none stated OT Goal Formulation: Patient unable to participate in goal setting Time For Goal Achievement: 06/01/18 Potential to Achieve Goals: Good  OT Frequency: Min 3X/week   Barriers to D/C:            Co-evaluation PT/OT/SLP Co-Evaluation/Treatment: Yes Reason for Co-Treatment: Complexity of the patient's impairments (multi-system involvement);For patient/therapist safety;To address functional/ADL transfers;Necessary to address cognition/behavior during functional activity   OT goals addressed during session: ADL's and self-care;Other (comment)(mobility)      AM-PAC OT "6 Clicks" Daily Activity     Outcome Measure Help from another person eating meals?: Total Help from another person taking care of personal grooming?: A Lot Help from another person toileting, which includes using toliet, bedpan,  or urinal?: Total Help from  another person bathing (including washing, rinsing, drying)?: A Lot Help from another person to put on and taking off regular upper body clothing?: A Lot Help from another person to put on and taking off regular lower body clothing?: Total 6 Click Score: 9   End of Session Nurse Communication: Mobility status  Activity Tolerance: Patient tolerated treatment well Patient left: in chair;with family/visitor present;with chair alarm set  OT Visit Diagnosis: Other abnormalities of gait and mobility (R26.89);Muscle weakness (generalized) (M62.81);Other symptoms and signs involving cognitive function;Cognitive communication deficit (R41.841);Hemiplegia and hemiparesis Symptoms and signs involving cognitive functions: Nontraumatic intracerebral hemorrhage Hemiplegia - Right/Left: Right Hemiplegia - dominant/non-dominant: Dominant Hemiplegia - caused by: Nontraumatic intracerebral hemorrhage                Time: 8358-4465 OT Time Calculation (min): 41 min Charges:  OT General Charges $OT Visit: 1 Visit OT Evaluation $OT Eval High Complexity: 1 High  Delight Stare, OT Acute Rehabilitation Services Pager [phone] Office [phone]   Delight Stare 05/18/2018, 12:51 PM

## 2018-05-18 NOTE — Evaluation (Addendum)
Physical Therapy Evaluation Patient Details Name: [name] [name] MRN: [id number] DOB: 06/13/1986 Today's Date: 05/18/2018   History of Present Illness  Pt is a 32 y/o female  with history of HTN on amlodipine, allergic rhinitis, chronic nasal congestion, eczema is here for evaluation of headache.  Admitted 11/21 with SAH and taken to OR for clipping 11/22, but remained on the ventilator in the post-operative setting. Intubated 11/21-11/27. 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 CXR shows Left basilar atelectasis with associated effusion.   Clinical Impression  Pt admitted with/for cerebral hemorrhage due to aneurysm described above.  Pt needing max to total assist of 1 to 2 persons for basic mobility.  Pt currently limited functionally due to the problems listed. ( See problems list.)   Pt will benefit from PT to maximize function and safety in order to get ready for next venue listed below.     Follow Up Recommendations CIR    Equipment Recommendations  Other (comment)(TBA)    Recommendations for Other Services Rehab consult     Precautions / Restrictions Precautions Precautions: Fall Restrictions Weight Bearing Restrictions: No      Mobility  Bed Mobility Overal bed mobility: Needs Assistance Bed Mobility: Rolling;Sidelying to Sit Rolling: Max assist Sidelying to sit: Max assist;+2 for physical assistance       General bed mobility comments: patient rolling toward L side, able to use L UE to assist with transition to sitting given max cueing and physical assist  Transfers Overall transfer level: Needs assistance   Transfers: Squat Pivot Transfers     Squat pivot transfers: Total assist;+2 physical assistance;+2 safety/equipment     General transfer comment: cueing for hand placement and sequencing, requires +2 to pivot towards L side  Ambulation/Gait             General Gait Details: Not able  Stairs             Wheelchair Mobility    Modified Rankin (Stroke Patients Only) Modified Rankin (Stroke Patients Only) Pre-Morbid Rankin Score: No symptoms Modified Rankin: Severe disability     Balance Overall balance assessment: Needs assistance Sitting-balance support: Feet supported;Single extremity supported Sitting balance-Leahy Scale: Poor Sitting balance - Comments: L UE hand support with R lateral and posterior lean, mod assist to maintain static balance  Postural control: Posterior lean;Right lateral lean                                   Pertinent Vitals/Pain Pain Assessment: Faces Faces Pain Scale: No hurt    Home Living Family/patient expects to be discharged to:: Private residence Living Arrangements: Alone;Children Available Help at Discharge: Family;Friend(s)(to be determined) Type of Home: Apartment Home Access: Stairs to enter Entrance Stairs-Rails: None Entrance Stairs-Number of Steps: 3 Home Layout: Two level   Additional Comments: information provided by pts aunt     Prior Function Level of Independence: Independent         Comments: - drive, 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      Hand Dominance   Dominant Hand: Right    Extremity/Trunk Assessment   Upper Extremity Assessment Upper Extremity Assessment: RUE deficits/detail RUE Deficits / Details: flaccid, no functional use  RUE Sensation: decreased light touch;decreased proprioception RUE Coordination: decreased fine motor;decreased gross motor    Lower Extremity Assessment Lower Extremity Assessment: RLE deficits/detail(L side moved voluntarily) RLE Deficits / Details: no spontaneous movement noted RLE Coordination:  decreased fine motor;decreased gross motor    Cervical / Trunk Assessment Cervical / Trunk Assessment: Other exceptions Cervical / Trunk Exceptions: low tone, L head tiltl  Communication   Communication: Receptive difficulties;Expressive difficulties  Cognition  Arousal/Alertness: Awake/alert   Overall Cognitive Status: Difficult to assess                                 General Comments: pt able to follow 1 step commands inconsistently, able to use "thumbs up" with increased time      General Comments      Exercises Other Exercises Other Exercises: warm up ROM exercise prior to mobility   Assessment/Plan    PT Assessment Patient needs continued PT services  PT Problem List Decreased strength;Decreased activity tolerance;Decreased balance;Decreased mobility;Decreased coordination;Decreased knowledge of precautions       PT Treatment Interventions DME instruction;Functional mobility training;Therapeutic activities;Therapeutic exercise;Balance training;Neuromuscular re-education;Patient/family education    PT Goals (Current goals can be found in the Care Plan section)  Acute Rehab PT Goals Patient Stated Goal: none stated PT Goal Formulation: Patient unable to participate in goal setting Time For Goal Achievement: 06/01/18 Potential to Achieve Goals: Fair    Frequency Min 3X/week   Barriers to discharge        Co-evaluation PT/OT/SLP Co-Evaluation/Treatment: Yes Reason for Co-Treatment: Complexity of the patient's impairments (multi-system involvement) PT goals addressed during session: Mobility/safety with mobility OT goals addressed during session: ADL's and self-care       AM-PAC PT "6 Clicks" Mobility  Outcome Measure Help needed turning from your back to your side while in a flat bed without using bedrails?: Total Help needed moving from lying on your back to sitting on the side of a flat bed without using bedrails?: Total Help needed moving to and from a bed to a chair (including a wheelchair)?: Total Help needed standing up from a chair using your arms (e.g., wheelchair or bedside chair)?: Total Help needed to walk in hospital room?: Total Help needed climbing 3-5 steps with a railing? : Total 6 Click  Score: 6    End of Session   Activity Tolerance: Patient tolerated treatment well Patient left: in chair;with call bell/phone within reach;with chair alarm set;Other (comment)(on lift pad) Nurse Communication: Mobility status PT Visit Diagnosis: Other abnormalities of gait and mobility (R26.89);Other symptoms and signs involving the nervous system (R29.898);Hemiplegia and hemiparesis Hemiplegia - Right/Left: Right Hemiplegia - dominant/non-dominant: Dominant Hemiplegia - caused by: Other Nontraumatic intracranial hemorrhage    Time: 9390-3009 PT Time Calculation (min) (ACUTE ONLY): 41 min   Charges:   PT Evaluation $PT Eval High Complexity: 1 High PT Treatments $Therapeutic Activity: 8-22 mins        05/18/2018  Donnella Sham, PT Acute Rehabilitation Services [phone]  (pager) [phone]  (office)  Tessie Fass Christy Ehrsam 05/18/2018, 5:38 PM

## 2018-05-19 LAB — CULTURE, BLOOD (ROUTINE X 2)
CULTURE: NO GROWTH
Culture: NO GROWTH

## 2018-05-19 LAB — GLUCOSE, CAPILLARY
GLUCOSE-CAPILLARY: 117 mg/dL — AB (ref 70–99)
Glucose-Capillary: 115 mg/dL — ABNORMAL HIGH (ref 70–99)
Glucose-Capillary: 115 mg/dL — ABNORMAL HIGH (ref 70–99)
Glucose-Capillary: 110 mg/dL — ABNORMAL HIGH (ref 70–99)
Glucose-Capillary: 115 mg/dL — ABNORMAL HIGH (ref 70–99)
Glucose-Capillary: 105 mg/dL — ABNORMAL HIGH (ref 70–99)

## 2018-05-19 NOTE — Progress Notes (Signed)
Patient ID: [name] [name], female   DOB: [birth year], 32 y.o.   MRN: [id number] Alert and follows commands with the left side plegic on the right level of consciousness remains good consistently however patient is completely mute still.  Continue supportive care in the ICU no clinical signs of vasospasm.

## 2018-05-20 ENCOUNTER — Inpatient Hospital Stay (HOSPITAL_COMMUNITY): Payer: Medicaid Other

## 2018-05-20 DIAGNOSIS — I609 Nontraumatic subarachnoid hemorrhage, unspecified: Secondary | ICD-10-CM

## 2018-05-20 LAB — GLUCOSE, CAPILLARY
GLUCOSE-CAPILLARY: 95 mg/dL (ref 70–99)
Glucose-Capillary: 112 mg/dL — ABNORMAL HIGH (ref 70–99)
Glucose-Capillary: 99 mg/dL (ref 70–99)
Glucose-Capillary: 120 mg/dL — ABNORMAL HIGH (ref 70–99)
Glucose-Capillary: 97 mg/dL (ref 70–99)

## 2018-05-20 NOTE — Progress Notes (Signed)
  Speech Language Pathology Treatment: Cognitive-Linquistic;Dysphagia  Patient Details Name: [name] [name] MRN: [id number] DOB: 1985-12-04 Today's Date: 05/20/2018 Time: 9147-8295 SLP Time Calculation (min) (ACUTE ONLY): 25 min  Assessment / Plan / Recommendation Clinical Impression  Observed family and [employer] employee attempting to elicit functional communication about meals from hall. Assisted in making choices by pointing to words in set of three. Pt repeatedly able to make choices that her aunt felt were accurate base on her tastes. (Coke, sausage, potatoes, [place] fries, etc). Also attempted use of thumbs up and down for yes/no. Pt needs words placed very close to her face due to baseline visual impairment and also needs some support to her hand to draw her finger past midline if the word is in her right visual field. Pt also grasped pen and attempted to write her name with left hand, though completely illegible. She was able to copy the letter B accurately. Will continue efforts with alternative communication strategies. Pt would benefit from a white board so family can write words for making choices.  Observed pt with with sips of coke. She needed min verbal cues to initiate and attend. But was able to take sips functionally as expected. Cough x1. Will attempt to visit pt during meal tomorrow.   HPI HPI: 32 year old female admitted 11/21 with SAH and taken to OR for clipping 11/22, but remained on the ventilator in the post-operative setting. Intubated 11/21-11/27. 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 CXR shows Left basilar atelectasis with associated effusion.      SLP Plan  Continue with current plan of care       Recommendations  Diet recommendations: Thin liquid;Regular Liquids provided via: Cup;Straw Compensations: Slow rate;Small sips/bites                Oral Care Recommendations: Oral care BID Follow  up Recommendations: Inpatient Rehab SLP Visit Diagnosis: Apraxia (R48.2) Plan: Continue with current plan of care       GO               Herbie Baltimore, MA [place] Pager [phone] Office [phone]  Lynann Beaver 05/20/2018, 3:23 PM

## 2018-05-20 NOTE — Progress Notes (Signed)
Physical Therapy Treatment Patient Details Name: [name] [name] MRN: [id number] DOB: May 02, 1986 Today's Date: 05/20/2018    History of Present Illness Pt is a 32 y/o female  with history of HTN on amlodipine, allergic rhinitis, chronic nasal congestion, eczema is here for evaluation of headache.  Admitted 11/21 with SAH and taken to OR for clipping 11/22, but remained on the ventilator in the post-operative setting. Intubated 11/21-11/27. 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 CXR shows Left basilar atelectasis with associated effusion.     PT Comments    Pt still not verbal, but given time seems to follow simple commands on occasion and commands with gestures with more frequency.  Emphasis on transitions to sit, sitting balance and transfers.   Follow Up Recommendations  CIR     Equipment Recommendations  Other (comment)(TBA)    Recommendations for Other Services Rehab consult     Precautions / Restrictions Precautions Precautions: Fall Restrictions Weight Bearing Restrictions: No    Mobility  Bed Mobility Overal bed mobility: Needs Assistance Bed Mobility: Rolling;Sidelying to Sit Rolling: Max assist;+2 for physical assistance Sidelying to sit: Max assist;+2 for physical assistance       General bed mobility comments: pt rolling towards R side, cueing for sequencing and hand placement of L UE on bed rail, max asisst +2 to ascend into sitting   Transfers Overall transfer level: Needs assistance Equipment used: None Transfers: Sit to/from [facility] Sit to Stand: Total assist;+2 physical assistance Stand pivot transfers: Total assist;+2 physical assistance       General transfer comment: patient able to assist with L UE, but overall requires total assist +2   Ambulation/Gait             General Gait Details: Not able   Stairs             Wheelchair Mobility    Modified Rankin  (Stroke Patients Only)       Balance Overall balance assessment: Needs assistance Sitting-balance support: No upper extremity supported;Feet supported;Single extremity supported Sitting balance-Leahy Scale: Poor Sitting balance - Comments: grooming at EOB with 0-1 hand support with mod assist to maintain balance, at times max assist with post lean (tolerating approx 10 min)  Postural control: Posterior lean;Right lateral lean Standing balance support: Bilateral upper extremity supported;During functional activity Standing balance-Leahy Scale: Zero                              Cognition Arousal/Alertness: Awake/alert Behavior During Therapy: Flat affect Overall Cognitive Status: Impaired/Different from baseline Area of Impairment: Following commands;Problem solving                       Following Commands: Follows one step commands inconsistently;Follows one step commands with increased time     Problem Solving: Slow processing;Decreased initiation;Difficulty sequencing;Requires verbal cues;Requires tactile cues        Exercises Other Exercises Other Exercises: warm up ROM exercise prior to mobility    General Comments General comments (skin integrity, edema, etc.): vss      Pertinent Vitals/Pain Pain Assessment: Faces Faces Pain Scale: No hurt    Home Living                      Prior Function            PT Goals (current goals can now be found in the care  plan section) Acute Rehab PT Goals Patient Stated Goal: none stated PT Goal Formulation: Patient unable to participate in goal setting Time For Goal Achievement: 06/01/18 Potential to Achieve Goals: Fair Progress towards PT goals: Progressing toward goals    Frequency    Min 3X/week      PT Plan Current plan remains appropriate    Co-evaluation PT/OT/SLP Co-Evaluation/Treatment: Yes Reason for Co-Treatment: Complexity of the patient's impairments (multi-system  involvement) PT goals addressed during session: Mobility/safety with mobility OT goals addressed during session: ADL's and self-care      AM-PAC PT "6 Clicks" Mobility   Outcome Measure  Help needed turning from your back to your side while in a flat bed without using bedrails?: Total Help needed moving from lying on your back to sitting on the side of a flat bed without using bedrails?: Total Help needed moving to and from a bed to a chair (including a wheelchair)?: Total Help needed standing up from a chair using your arms (e.g., wheelchair or bedside chair)?: Total Help needed to walk in hospital room?: Total Help needed climbing 3-5 steps with a railing? : Total 6 Click Score: 6    End of Session   Activity Tolerance: Patient tolerated treatment well Patient left: in chair;with call bell/phone within reach;with chair alarm set;Other (comment)(lift pad in the chair.) Nurse Communication: Mobility status PT Visit Diagnosis: Other abnormalities of gait and mobility (R26.89);Hemiplegia and hemiparesis Hemiplegia - Right/Left: Right Hemiplegia - dominant/non-dominant: Dominant Hemiplegia - caused by: Other Nontraumatic intracranial hemorrhage     Time: 1232-1302 PT Time Calculation (min) (ACUTE ONLY): 30 min  Charges:  $Therapeutic Activity: 8-22 mins                     05/20/2018  Donnella Sham, PT Acute Rehabilitation Services [phone]  (pager) [phone]  (office)   Tessie Fass Kaisen Ackers 05/20/2018, 6:07 PM

## 2018-05-20 NOTE — Progress Notes (Signed)
Rehab Admissions Coordinator Note:  Patient was screened by Cleatrice Burke for appropriateness for an Inpatient Acute Rehab Consult per PT and OT recommendation.   At this time, we are recommending Inpatient Rehab consult. Please place order for consult.  Danne Baxter, RN, MSN Rehab Admissions Coordinator [phone] 05/20/2018 2:59 PM

## 2018-05-20 NOTE — Progress Notes (Addendum)
Modified Barium Swallow Progress Note  Patient Details  Name: [name] [name] MRN: [id number] Date of Birth: Aug 14, 1985  Today's Date: 05/20/2018  Modified Barium Swallow completed.  Full report located under Chart Review in the Imaging Section.  Brief recommendations include the following:  Clinical Impression  Pt demonstrates normal swallow function. Able to self feed and initiate all necessary movements for PO intake. No penetration or aspiration. Will initiate a regular diet and thin liquids and f/u x1 to ensure pt does not have additional needs given severity of cognitive lingusitic deficits.[name] [name] Evaluation Recommendations       SLP Diet Recommendations: Thin liquid;Regular solids       Medication Administration: Whole meds with liquid   Supervision: Patient able to self feed   Compensations: Slow rate;Small sips/bites   Postural Changes: Seated upright at 90 degrees           Herbie Baltimore, MA CCC-SLP  Acute Rehabilitation Services Pager [phone] Office [phone]  [name] [name], [name] 05/20/2018,12:38 PM

## 2018-05-20 NOTE — Progress Notes (Addendum)
Pharmacy Antibiotic Note [name] [name] is a 32 y.o. female admitted on 05/09/2018 with aneurysm. On Cefepime for treatment of pneumonia through 12/4. WBC 7.3. Tmax 99  Plan: 1. Continue Cefepime 2 grams IV every 12 hours through 12/4  Height: 5\' 10"  (177.8 cm) Weight: 218 lb 4.1 oz (99 kg) IBW/kg (Calculated) : 68.5  Temp (24hrs), Avg:98.7 F (37.1 C), Min:97.5 F (36.4 C), Max:99 F (37.2 C)  Recent Labs  Lab 05/14/18 0553 05/15/18 0615 05/16/18 0309 05/17/18 0327 05/18/18 0252  WBC 9.2 7.7 7.9 6.1 7.3  CREATININE 0.54 0.54 0.45 0.52 0.57    Estimated Creatinine Clearance: 128.6 mL/min (by C-G formula based on SCr of 0.57 mg/dL).    Allergies  Allergen Reactions  . Sulfa Antibiotics Swelling    Causes swelling on the face.  . Shellfish Allergy Other (See Comments)    Stomach upset    Antimicrobials this admission: 11/27 Cefepime  >>  11/27 vancomycin >> 11/29  Microbiology results: 11/26 BCx: ngtd 11/26 Sputum: moraxella 11/21 MRSA PCR: negative  Thank you for allowing pharmacy to be a part of this patient's care.  Alanda Slim, PharmD, [facility] Clinical Pharmacist Please see AMION for all Pharmacists' Contact Phone Numbers 05/20/2018, 10:06 AM

## 2018-05-20 NOTE — Progress Notes (Signed)
  NEUROSURGERY PROGRESS NOTE   No issues overnight.  Remains mute  EXAM:  BP 129/72   Pulse 87   Temp 98.8 F (37.1 C) (Oral)   Resp (!) 22   Ht 5\' 10"  (1.778 m)   Wt 99 kg   LMP 05/03/2018   SpO2 97%   BMI 31.32 kg/m   Awake, alert Mute Right sided plegia Follows commands with left side Incision c/d/i  IMPRESSION/PLAN 32 y.o. female s/p clipping of multilobulated Acom aneurysm with postop left ACA territory stroke.  Neurologically stable. - Continue supportive care, nimotop

## 2018-05-20 NOTE — Progress Notes (Addendum)
Occupational Therapy Treatment Patient Details Name: [name] [name] MRN: [id number] DOB: [birth year] Today's Date: 05/20/2018    History of present illness Pt is a 32 y/o female  with history of HTN on amlodipine, allergic rhinitis, chronic nasal congestion, eczema is here for evaluation of headache.  Admitted 11/21 with SAH and taken to OR for clipping 11/22, but remained on the ventilator in the post-operative setting. Intubated 11/21-11/27. 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 CXR shows Left basilar atelectasis with associated effusion.    OT comments  Patient progressing slowly.  Patient continues to requires max assist +2 for bed mobility and total assist +2 for transfers, but is following commands utilizing L side and demonstrating improving sitting balance at EOB with mod assist (although at times has strong posterior lean with max assist required).  Engaged in weightbearing and grooming tasks at EOB today, washing face/hands with mod assist, and noted active R hand gross flexion/extension with hand washing (inconsistent).   Continue to recommend CIR.  Will continue to follow.    Follow Up Recommendations  CIR    Equipment Recommendations  Other (comment)(TBD at next venue of care)    Recommendations for Other Services Rehab consult    Precautions / Restrictions Precautions Precautions: Fall Restrictions Weight Bearing Restrictions: No       Mobility Bed Mobility Overal bed mobility: Needs Assistance Bed Mobility: Rolling;Sidelying to Sit Rolling: Max assist;+2 for physical assistance Sidelying to sit: Max assist;+2 for physical assistance       General bed mobility comments: pt rolling towards R side, cueing for sequencing and hand placement of L UE on bed rail, max asisst +2 to ascend into sitting   Transfers Overall transfer level: Needs assistance Equipment used: None Transfers: Sit to/from [employer] Sit to Stand: Total assist;+2 physical assistance Stand pivot transfers: Total assist;+2 physical assistance       General transfer comment: patient able to assist with L UE, but overall requires total assist +2     Balance Overall balance assessment: Needs assistance Sitting-balance support: No upper extremity supported;Feet supported;Single extremity supported Sitting balance-Leahy Scale: Poor Sitting balance - Comments: grooming at EOB with 0-1 hand support with mod assist to maintain balance, at times max assist with post lean (tolerating approx 10 min)  Postural control: Posterior lean;Right lateral lean                                 ADL either performed or assessed with clinical judgement   ADL Overall ADL's : Needs assistance/impaired     Grooming: Wash/dry hands;Wash/dry face;Moderate assistance Grooming Details (indicate cue type and reason): demonstrates ability to wash face with supervision (cueing to complete all parts), and wash hands with mod assist for L hand                 Toilet Transfer: Total assistance;+2 for physical assistance;Stand-pivot Toilet Transfer Details (indicate cue type and reason): simulated to recliner  Toileting- Clothing Manipulation and Hygiene: Total assistance;+2 for physical assistance;Sit to/from stand       Functional mobility during ADLs: Total assistance;Maximal assistance;+2 for physical assistance General ADL Comments: patient limited by R hemiparesis, impaired cognition and communication, and impaired balance      Vision   Additional Comments: continue assessment, increased time to scan to locate items on tray table   Perception     Praxis  Cognition Arousal/Alertness: Awake/alert Behavior During Therapy: Flat affect Overall Cognitive Status: Impaired/Different from baseline Area of Impairment: Following commands;Problem solving                       Following Commands:  Follows one step commands inconsistently;Follows one step commands with increased time     Problem Solving: Slow processing;Decreased initiation;Difficulty sequencing;Requires verbal cues;Requires tactile cues          Exercises     Shoulder Instructions       General Comments VSS    Pertinent Vitals/ Pain       Pain Assessment: Faces Faces Pain Scale: No hurt  Home Living                                          Prior Functioning/Environment              Frequency  Min 3X/week        Progress Toward Goals  OT Goals(current goals can now be found in the care plan section)  Progress towards OT goals: Progressing toward goals  Acute Rehab OT Goals Patient Stated Goal: none stated OT Goal Formulation: Patient unable to participate in goal setting Time For Goal Achievement: 06/01/18 Potential to Achieve Goals: Good  Plan Discharge plan remains appropriate;Frequency remains appropriate    Co-evaluation    PT/OT/SLP Co-Evaluation/Treatment: Yes Reason for Co-Treatment: Complexity of the patient's impairments (multi-system involvement);For patient/therapist safety;To address functional/ADL transfers   OT goals addressed during session: ADL's and self-care;Strengthening/ROM      AM-PAC OT "6 Clicks" Daily Activity     Outcome Measure   Help from another person eating meals?: Total Help from another person taking care of personal grooming?: A Lot Help from another person toileting, which includes using toliet, bedpan, or urinal?: Total Help from another person bathing (including washing, rinsing, drying)?: A Lot Help from another person to put on and taking off regular upper body clothing?: A Lot Help from another person to put on and taking off regular lower body clothing?: Total 6 Click Score: 9    End of Session    OT Visit Diagnosis: Other abnormalities of gait and mobility (R26.89);Muscle weakness (generalized) (M62.81);Other  symptoms and signs involving cognitive function;Cognitive communication deficit (R41.841);Hemiplegia and hemiparesis Symptoms and signs involving cognitive functions: Nontraumatic intracerebral hemorrhage Hemiplegia - Right/Left: Right Hemiplegia - dominant/non-dominant: Dominant Hemiplegia - caused by: Nontraumatic intracerebral hemorrhage   Activity Tolerance Patient tolerated treatment well   Patient Left in chair;with chair alarm set;with call bell/phone within reach   Nurse Communication Mobility status        Time: 1517-6160 OT Time Calculation (min): 30 min  Charges: OT General Charges $OT Visit: 1 Visit OT Treatments $Self Care/Home Management : 8-22 mins  Delight Stare, OT Acute Rehabilitation Services Pager [phone] Office [phone]    Delight Stare 05/20/2018, 3:11 PM

## 2018-05-21 DIAGNOSIS — I609 Nontraumatic subarachnoid hemorrhage, unspecified: Secondary | ICD-10-CM

## 2018-05-21 DIAGNOSIS — I10 Essential (primary) hypertension: Secondary | ICD-10-CM

## 2018-05-21 DIAGNOSIS — Z720 Tobacco use: Secondary | ICD-10-CM

## 2018-05-21 DIAGNOSIS — G8191 Hemiplegia, unspecified affecting right dominant side: Secondary | ICD-10-CM

## 2018-05-21 DIAGNOSIS — D62 Acute posthemorrhagic anemia: Secondary | ICD-10-CM

## 2018-05-21 DIAGNOSIS — I639 Cerebral infarction, unspecified: Secondary | ICD-10-CM

## 2018-05-21 DIAGNOSIS — J189 Pneumonia, unspecified organism: Secondary | ICD-10-CM

## 2018-05-21 LAB — GLUCOSE, CAPILLARY
Glucose-Capillary: 110 mg/dL — ABNORMAL HIGH (ref 70–99)
Glucose-Capillary: 112 mg/dL — ABNORMAL HIGH (ref 70–99)
Glucose-Capillary: 102 mg/dL — ABNORMAL HIGH (ref 70–99)
Glucose-Capillary: 104 mg/dL — ABNORMAL HIGH (ref 70–99)
Glucose-Capillary: 121 mg/dL — ABNORMAL HIGH (ref 70–99)
Glucose-Capillary: 98 mg/dL (ref 70–99)

## 2018-05-21 MED ORDER — NIMODIPINE 30 MG PO CAPS
60.0000 mg | ORAL_CAPSULE | ORAL | Status: DC
  Administered 2018-05-27: 60 mg via ORAL
  Filled 2018-05-21 (×2): qty 2

## 2018-05-21 MED ORDER — AMLODIPINE BESYLATE 10 MG PO TABS
10.0000 mg | ORAL_TABLET | Freq: Every day | ORAL | Status: DC
  Administered 2018-05-21 – 2018-05-27 (×7): 10 mg via ORAL
  Filled 2018-05-21 (×7): qty 1

## 2018-05-21 MED ORDER — ENSURE ENLIVE PO LIQD
237.0000 mL | Freq: Two times a day (BID) | ORAL | Status: DC
  Administered 2018-05-21 – 2018-05-26 (×8): 237 mL via ORAL

## 2018-05-21 MED ORDER — ADULT MULTIVITAMIN W/MINERALS CH
1.0000 | ORAL_TABLET | Freq: Every day | ORAL | Status: DC
  Administered 2018-05-22 – 2018-05-27 (×6): 1 via ORAL
  Filled 2018-05-21 (×6): qty 1

## 2018-05-21 MED ORDER — MONTELUKAST SODIUM 10 MG PO TABS
10.0000 mg | ORAL_TABLET | Freq: Every day | ORAL | Status: DC
  Administered 2018-05-21 – 2018-05-26 (×6): 10 mg via ORAL
  Filled 2018-05-21 (×6): qty 1

## 2018-05-21 MED ORDER — FAMOTIDINE 40 MG/5ML PO SUSR
20.0000 mg | Freq: Every day | ORAL | Status: DC
  Administered 2018-05-21 – 2018-05-26 (×6): 20 mg via ORAL
  Filled 2018-05-21 (×6): qty 2.5

## 2018-05-21 MED ORDER — ADULT MULTIVITAMIN LIQUID CH
15.0000 mL | Freq: Every day | ORAL | Status: DC
  Administered 2018-05-21: 15 mL via ORAL
  Filled 2018-05-21: qty 15

## 2018-05-21 MED ORDER — NIMODIPINE 60 MG/20ML PO SOLN
60.0000 mg | ORAL | Status: DC
  Administered 2018-05-21 – 2018-05-27 (×37): 60 mg via ORAL
  Filled 2018-05-21 (×36): qty 20

## 2018-05-21 MED ORDER — LORATADINE 10 MG PO TABS
10.0000 mg | ORAL_TABLET | Freq: Every day | ORAL | Status: DC
  Administered 2018-05-21 – 2018-05-27 (×7): 10 mg via ORAL
  Filled 2018-05-21 (×7): qty 1

## 2018-05-21 NOTE — Progress Notes (Signed)
Nutrition Follow-up  DOCUMENTATION CODES:   Obesity unspecified  INTERVENTION:   Ensure Enlive po BID, each supplement provides 350 kcal and 20 grams of protein  Magic cup TID with meals, each supplement provides 290 kcal and 9 grams of protein  Honor food preferences  NUTRITION DIAGNOSIS:   Inadequate oral intake related to lethargy/confusion as evidenced by meal completion < 50%. Ongoing.   GOAL:   Patient will meet greater than or equal to 90% of their needs Progressing.   MONITOR:   PO intake, Supplement acceptance  ASSESSMENT:   Pt with PMH of HTN admitted 11/21 with SAH s/p clipping 11/22.   Pt discussed during ICU rounds and with RN.  11/27 extubated but remains too lethargic for SLP, Cortrak placed (tip in stomach) 12/2 Cortrak removed Medications reviewed and include: MVI Labs reviewed   Pt seen eating lunch. SHe is right handed but due to right sided paresis/neglect is feeding herself with her left hand. Noted not attentive at times.  Pt unable to state preferences.     Diet Order:   Diet Order            Diet regular Room service appropriate? Yes; Fluid consistency: Thin  Diet effective now              EDUCATION NEEDS:   Not appropriate for education at this time  Skin:  Skin Assessment: Skin Integrity Issues: Skin Integrity Issues:: Incisions Incisions: closed head  Last BM:  40 ml x 24 via rectal tube  Height:   Ht Readings from Last 1 Encounters:  05/17/18 5\' 10"  (1.778 m)    Weight:   Wt Readings from Last 1 Encounters:  05/21/18 99 kg    Ideal Body Weight:  68.2 kg  BMI:  Body mass index is 31.32 kg/m.  Estimated Nutritional Needs:   Kcal:  1900-2100  Protein:  100-115 grams  Fluid:  > 1.9 L/day  Maylon Peppers RD, LDN, CNSC [phone] Pager [phone] After Hours Pager

## 2018-05-21 NOTE — Progress Notes (Signed)
  Speech Language Pathology Treatment: Dysphagia;Cognitive-Linquistic  Patient Details Name: [name] [name] MRN: [id number] DOB: 04/19/1986 Today's Date: 05/21/2018 Time: 0272-5366 SLP Time Calculation (min) (ACUTE ONLY): 50 min  Assessment / Plan / Recommendation Clinical Impression  Pt demonstrates tolerance of regular textures, but needs verbal and tactile cues to initiate self feeding and use LUE appropriately. Difficulty seems both a mix of apraxia and dislike of food. She also seems to have pain in her left arm with bending due to IV placement as she is stretching her arm out long and staring fixedly at her IV. Assisted pt in making choices with meals again, today pt able to give immediate and consistent y/n with either and yucky face for no, or a slight head nod for yes. Attempted interventions to target oral motor movement today, started with single bialabial with max visual and articulatory placement cues, no result, counting with MIT, no result, singing/humming with cues to initiate phonation, no result, branching from spontaneous throat clearing to volitional throat clearing, no result. Tried branching from pursed lips with independent chapstick placement to pursed lips on command with cues, no result. Tried cues to control breathing with model and verbal cues, no result. Tried blowing through a straw to move paper towel, no result.  I did get some spontaneous smiling and laughing with all these efforts, but no speech. Tima seemed sad after lack of success. Will continue efforts.     HPI HPI: 32 year old female admitted 11/21 with SAH and taken to OR for clipping 11/22, but remained on the ventilator in the post-operative setting. Intubated 11/21-11/27. 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 CXR shows Left basilar atelectasis with associated effusion.      SLP Plan  Continue with current plan of care       Recommendations  Diet  recommendations: Regular;Thin liquid Liquids provided via: Cup;Straw Supervision: Staff to assist with self feeding;Full supervision/cueing for compensatory strategies Compensations: Slow rate;Small sips/bites                Plan: Continue with current plan of care       GO               Herbie [place], MA [place] Pager [phone] Office [phone]  Lynann Beaver 05/21/2018, 9:40 AM

## 2018-05-21 NOTE — Progress Notes (Signed)
  NEUROSURGERY PROGRESS NOTE   Pt seen and examined. No issues overnight.   EXAM: Temp:  [98.4 F (36.9 C)-98.8 F (37.1 C)] 98.8 F (37.1 C) (12/03 1200) Pulse Rate:  [71-96] 96 (12/03 1400) Resp:  [14-22] 14 (12/03 1500) BP: (119-142)/(76-103) 137/84 (12/03 1500) SpO2:  [97 %-99 %] 98 % (12/03 1500) Weight:  [99 kg] 99 kg (12/03 0200) Intake/Output      12/02 0701 - 12/03 0700 12/03 0701 - 12/04 0700   P.O.  240   I.V. (mL/kg) 524.2 (5.3)    NG/GT 240    IV Piggyback 199.9    Total Intake(mL/kg) 964 (9.7) 240 (2.4)   Urine (mL/kg/hr) 926 (0.4) 850 (0.9)   Stool 250    Total Output 1176 850   Net -212 -610        Urine Occurrence 3 x     Awake, alert Non-verbal Moves LUE/LLE to command well No voluntary movement RUE/RLE Wound c/d/i  LABS: Lab Results  Component Value Date   CREATININE 0.57 05/18/2018   BUN 13 05/18/2018   NA 139 05/18/2018   K 4.4 05/18/2018   CL 105 05/18/2018   CO2 23 05/18/2018   Lab Results  Component Value Date   WBC 7.3 05/18/2018   HGB 10.7 (L) 05/18/2018   HCT 33.9 (L) 05/18/2018   MCV 99.4 05/18/2018   PLT 349 05/18/2018    IMPRESSION: - 32 y.o. 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  PLAN: - Cont supportive care - Can likely go to rehab later this week if stable

## 2018-05-21 NOTE — Consult Note (Signed)
Physical Medicine and Rehabilitation Consult   Reason for Consult:  Functional decline due to aneurysm rupture.  Referring Physician: Dr. Kathyrn Sheriff.    HPI: [name] [name] is a 32 y.o. female with history of HTN, eczema, chronic nasal sinus congestion who was admitted via ED on 05/09/2018 with reports of headaches with bilateral neck pain and tightness radiating down to entire back to buttocks as well as vomiting x3 days.  CT head showing SAH.  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  She underwent cerebral angiography confirming presence of multilobulated A comm aneurysm which was not amenable to coil embolization therefore underwent Left pterional crani with aneurysm clipping by Dr. Kathyrn Sheriff. Post op developed right sided weakness with somnolence as well as elevated BP requiring cleviprex for BP control and nimotop added to manage vasospasm.  Follow up CT 11/23 head showed resolution of mild hydrocephalus and repeat CT head 11/25 reviewed, showing left ACA infarct. Per report, new area of hypoattenuation in L-ACA territory consistent with early subacute ischemia.   Fevers due to HCAP treated with IV antibiotics and she tolerated extubation by 11/27. She has been non-verbal and kept NPO yesterday. MBS done revealing normal swallow function and patient started on regular textures, thins. Therapy ongoing and patient limited by left hemiplegia, baseline visual deficits, aphonia with evidence of apraxia. CIR recommended due to functional decline in mobility and self care tasks.     Review of Systems  Unable to perform ROS: Patient nonverbal    Past Medical History:  Diagnosis Date  . Asthma, allergic    uses inhaler prn - infrequently  . Bilateral thoracic back pain 09/13/2015  . Eczema   . Hypertension   . Lactose intolerance   . Mature cystic teratoma of ovary   . Mood swings 06/01/2015  . Seasonal  allergies     Past Surgical History:  Procedure Laterality Date  . CRANIOTOMY N/A 05/10/2018   Procedure: 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;  Surgeon: Consuella Lose, MD;  Location: [facility];  Service: Neurosurgery;  Laterality: N/A;  . RADIOLOGY WITH ANESTHESIA N/A 05/10/2018   Procedure: EMBOLIZATION OF ANEURSYM;  Surgeon: Consuella Lose, MD;  Location: [facility];  Service: Radiology;  Laterality: N/A;  . TUBAL LIGATION N/A 12/15/2016   Procedure: POST PARTUM TUBAL LIGATION;  Surgeon: Mora Bellman, MD;  Location: [facility];  Service: Gynecology;  Laterality: N/A;  . WISDOM TOOTH EXTRACTION      Family History  Adopted: Yes  Problem Relation Age of Onset  . Hypertension Father   . Hypertension Maternal Aunt   . Hypertension Maternal Uncle   . Hypertension Maternal Grandmother   . Eczema Maternal Grandmother   . Hypertension Maternal Grandfather   . Hypertension Paternal Grandmother   . Hypertension Mother   . Eczema Mother     Social History:  Single. Lives with two young children. Indicated that she does not drive due to visual deficits? She reports that she has been smoking cigarettes. She has been smoking about 0.25 packs per day. She has quit using smokeless tobacco. Per reports she does not drink alcohol or use drugs.    Allergies  Allergen Reactions  . Sulfa Antibiotics Swelling    Causes swelling on the face.  . Shellfish Allergy Other (See Comments)    Stomach upset    Medications Prior to Admission  Medication Sig Dispense Refill  .  amLODipine (NORVASC) 10 MG tablet Take 1 tablet (10 mg total) by mouth daily. To lower blood pressure 30 tablet 6  . cetirizine (ZYRTEC) 10 MG tablet TAKE 1 TABLET BY MOUTH ONCE DAILY (Patient taking differently: Take 10 mg by mouth daily. ) 30 tablet 2  . ibuprofen (ADVIL,MOTRIN) 600 MG tablet Take 1 tablet (600 mg total) by mouth every 6 (six) hours. X 1week then prn pain 90 tablet 0  .  montelukast (SINGULAIR) 10 MG tablet TAKE 1 TABLET BY MOUTH ONCE DAILY AT BEDTIME (Patient taking differently: Take 10 mg by mouth at bedtime. ) 30 tablet 2  . omeprazole (PRILOSEC) 20 MG capsule TAKE 1 CAPSULE BY MOUTH ONCE DAILY (Patient taking differently: Take 20 mg by mouth daily. ) 30 capsule 2  . clobetasol ointment (TEMOVATE) 3.97 % Apply 1 application topically 2 (two) times daily. (Patient not taking: Reported on 05/09/2018) 60 g 6  . triamcinolone cream (KENALOG) 0.1 % Apply 1 application topically 2 (two) times daily as needed. To affected areas for eczema. (Patient not taking: Reported on 05/09/2018) 80 g 1    Home: Home Living Family/patient expects to be discharged to:: Private residence Living Arrangements: Alone, Children Available Help at Discharge: Family, Friend(s)(to be determined) Type of Home: Apartment Home Access: Stairs to enter [employer] of Steps: 3 Entrance Stairs-Rails: None Home Layout: Two level Alternate Level Stairs-Number of Steps: flight Alternate Level Stairs-Rails: Left Bathroom Shower/Tub: Tub/shower unit, Architectural technologist: Standard Additional Comments: information provided by pts aunt   Functional History: Prior Function Level of Independence: Independent Comments: - drive, 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  Functional Status:  Mobility: Bed Mobility Overal bed mobility: Needs Assistance Bed Mobility: Rolling, Sidelying to Sit Rolling: Max assist, +2 for physical assistance Sidelying to sit: Max assist, +2 for physical assistance General bed mobility comments: pt rolling towards R side, cueing for sequencing and hand placement of L UE on bed rail, max asisst +2 to ascend into sitting  Transfers Overall transfer level: Needs assistance Equipment used: None Transfers: Sit to/from Stand, [employer] Transfers Sit to Stand: Total assist, +2 physical assistance Stand pivot transfers: Total assist, +2 physical assistance Squat pivot transfers:  Total assist, +2 physical assistance, +2 safety/equipment General transfer comment: patient able to assist with L UE, but overall requires total assist +2  Ambulation/Gait General Gait Details: Not able    ADL: ADL Overall ADL's : Needs assistance/impaired Eating/Feeding: NPO Grooming: Wash/dry hands, Wash/dry face, Moderate assistance Grooming Details (indicate cue type and reason): demonstrates ability to wash face with supervision (cueing to complete all parts), and wash hands with mod assist for L hand Upper Body Bathing: Maximal assistance, Sitting Lower Body Bathing: Total assistance, +2 for physical assistance, Sitting/lateral leans Upper Body Dressing : Total assistance, Sitting Lower Body Dressing: Total assistance, +2 for physical assistance, Sit to/from stand Toilet Transfer: Total assistance, +2 for physical assistance, Stand-pivot Toilet Transfer Details (indicate cue type and reason): simulated to recliner  Toileting- Clothing Manipulation and Hygiene: Total assistance, +2 for physical assistance, Sit to/from stand Functional mobility during ADLs: Total assistance, Maximal assistance, +2 for physical assistance General ADL Comments: patient limited by R hemiparesis, impaired cognition and communication, and impaired balance   Cognition: Cognition Overall Cognitive Status: Impaired/Different from baseline Arousal/Alertness: Awake/alert Orientation Level: Other (comment)(UTA) Attention: Focused, Sustained Focused Attention: Appears intact Sustained Attention: Impaired Sustained Attention Impairment: Verbal complex, Functional complex Problem Solving: Impaired Problem Solving Impairment: Verbal basic, Functional basic Executive Function: Initiating Cognition Arousal/Alertness: Awake/alert Behavior  During Therapy: Flat affect Overall Cognitive Status: Impaired/Different from baseline Area of Impairment: Following commands, Problem solving Following Commands: Follows  one step commands inconsistently, Follows one step commands with increased time Problem Solving: Slow processing, Decreased initiation, Difficulty sequencing, Requires verbal cues, Requires tactile cues General Comments: pt able to follow 1 step commands inconsistently, able to use "thumbs up" with increased time Difficult to assess due to: Impaired communication  Blood pressure (!) 129/92, pulse 89, temperature 98.6 F (37 C), temperature source Axillary, resp. rate 18, height 5\' 10"  (1.778 m), weight 99 kg, last menstrual period 05/03/2018, SpO2 99 %, currently breastfeeding. Physical Exam  Nursing note and vitals reviewed. Constitutional: She appears well-developed. No distress.  Lying in bed with right bias.  Non verbal  Morbid obesity  HENT:  Head: Normocephalic.  Left crani incision C/D/I with staples in place.  Eyes: EOM are normal. Right eye exhibits no discharge. Left eye exhibits no discharge.  Neck: Normal range of motion. Neck supple.  Cardiovascular: Normal rate and regular rhythm.  Respiratory: Effort normal and breath sounds normal.  GI: Soft. Bowel sounds are normal.  Genitourinary:  Genitourinary Comments: +Rectal tube  Musculoskeletal:  No edema or tenderness in extremities  Neurological: She is alert.  Right lean.  Delayed answers but she was able to nod appropriately to basic Y/N orientation question.  Expressive aphasia Able to point to items in room.  Motor: RUE/RLE: 0/5 proximal to distal LUE: 5/5 proximal to distal LLE: ?4-/5 proximal to distal ?Sensation intact to light touch  Skin: She is not diaphoretic.  See above  Psychiatric:  Unable to assess due to nonverbal    Results for orders placed or performed during the hospital encounter of 05/09/18 (from the past 24 hour(s))  Glucose, capillary     Status: None   Collection Time: 05/20/18 12:29 PM  Result Value Ref Range   Glucose-Capillary 99 70 - 99 mg/dL  Glucose, capillary     Status: Abnormal     Collection Time: 05/20/18  3:53 PM  Result Value Ref Range   Glucose-Capillary 120 (H) 70 - 99 mg/dL   Comment 1 Notify RN   Glucose, capillary     Status: None   Collection Time: 05/20/18  7:52 PM  Result Value Ref Range   Glucose-Capillary 97 70 - 99 mg/dL  Glucose, capillary     Status: None   Collection Time: 05/20/18 11:48 PM  Result Value Ref Range   Glucose-Capillary 95 70 - 99 mg/dL  Glucose, capillary     Status: Abnormal   Collection Time: 05/21/18  3:53 AM  Result Value Ref Range   Glucose-Capillary 104 (H) 70 - 99 mg/dL  Glucose, capillary     Status: Abnormal   Collection Time: 05/21/18  8:22 AM  Result Value Ref Range   Glucose-Capillary 121 (H) 70 - 99 mg/dL   Comment 1 Notify RN    Comment 2 Document in Chart    Vas [name] Transcranial Doppler  Result Date: 05/20/2018  Transcranial Doppler Indications: Subarachnoid hemorrhage. History: A-comm aneurysm. 05/10/18 Craniotomy intracranial aneurysm for ACA aneurysm. Limitations: staples left side Performing Technologist: Velva Harman Sturdivant RDMS, RVT  Examination Guidelines: A complete evaluation includes B-mode imaging, spectral Doppler, color Doppler, and power Doppler as needed of all accessible portions of each vessel. Bilateral testing is considered an integral part of a complete examination. Limited examinations for reoccurring indications may be performed as noted.  +----------+-------------+----------+-----------+-------------+ RIGHT TCD Right VM (cm)Depth (cm)Pulsatility   Comment    +----------+-------------+----------+-----------+-------------+  MCA           44.00                 0.76                  +----------+-------------+----------+-----------+-------------+ ACA          -37.00                 0.82                  +----------+-------------+----------+-----------+-------------+ PCA                                         not insonated  +----------+-------------+----------+-----------+-------------+ Opthalmic     31.00                  1.1                  +----------+-------------+----------+-----------+-------------+ ICA siphon    50.00                 0.76                  +----------+-------------+----------+-----------+-------------+ Vertebral    -34.00                 0.70                  +----------+-------------+----------+-----------+-------------+  +----------+------------+----------+-----------+-------------+ LEFT TCD  Left VM (cm)Depth (cm)Pulsatility   Comment    +----------+------------+----------+-----------+-------------+ MCA          47.00                 0.48                  +----------+------------+----------+-----------+-------------+ ACA          -55.00                0.67                  +----------+------------+----------+-----------+-------------+ Term ICA     25.00                  1.0                  +----------+------------+----------+-----------+-------------+ PCA                                        not insonated +----------+------------+----------+-----------+-------------+ Opthalmic    31.00                  1.2                  +----------+------------+----------+-----------+-------------+ ICA siphon   52.00                 0.78                  +----------+------------+----------+-----------+-------------+ Vertebral    -40.00                0.71                  +----------+------------+----------+-----------+-------------+  +------------+-------+-------+             VM cm/sComment +------------+-------+-------+ Prox Basilar-46.00         +------------+-------+-------+  Dist Basilar-49.00         +------------+-------+-------+ Summary: This was a normal transcranial Doppler study, with normal flow direction and velocity of all identified vessels of the anterior and posterior circulations, with no evidence of stenosis, vasospasm or  occlusion. There was no evidence of intracranial disease.  *See table(s) above for measurements and observations.  Diagnosing physician: Antony Contras MD Electronically signed by Antony Contras MD on 05/20/2018 at 12:14:34 PM.    Final     Assessment/Plan: Diagnosis: Left ACA infarct Labs and images (see above) independently reviewed.  Records reviewed and summated above. Stroke: Continue secondary stroke prophylaxis and Risk Factor Modification listed below:   Antiplatelet therapy:   Blood Pressure Management:  Continue current medication with prn's with permisive HTN per primary team Statin Agent:   Tobacco abuse:   Right sided hemiparesis: fit for orthosis to prevent contractures (resting hand splint for day, wrist cock up splint at night, PRAFO, etc) Motor recovery: Fluoxetine  1. Does the need for close, 24 hr/day medical supervision in concert with the patient's rehab needs make it unreasonable for this patient to be served in a less intensive setting? Yes  2. Co-Morbidities requiring supervision/potential complications: HCAP (cont IV abx), HTN (monitor and provide prns in accordance with increased physical exertion and pain), eczema, chronic nasal sinus congestion, ABLA (repeat labs, transfuse to ensure appropriate perfusion for increased activity tolerance) 3. Due to bladder management, bowel management, safety, skin/wound care, disease management, medication administration, pain management and patient education, does the patient require 24 hr/day rehab nursing? Yes 4. Does the patient require coordinated care of a physician, rehab nurse, PT (1-2 hrs/day, 5 days/week), OT (1-2 hrs/day, 5 days/week) and SLP (1-2 hrs/day, 5 days/week) to address physical and functional deficits in the context of the above medical diagnosis(es)? Yes Addressing deficits in the following areas: balance, endurance, locomotion, strength, transferring, bowel/bladder control, bathing, dressing, feeding, grooming,  toileting, cognition, speech, language, swallowing and psychosocial support 5. Can the patient actively participate in an intensive therapy program of at least 3 hrs of therapy per day at least 5 days per week? Potentially 6. The potential for patient to make measurable gains while on inpatient rehab is excellent 7. Anticipated functional outcomes upon discharge from inpatient rehab are min assist and mod assist  with PT, min assist and mod assist with OT, min assist with SLP. 8. Estimated rehab length of stay to reach the above functional goals is: 25-30 days. 9. Anticipated D/C setting: Other 10. Anticipated post D/C treatments: HH therapy and Home excercise program 11. Overall Rehab/Functional Prognosis: good  RECOMMENDATIONS: This patient's condition is appropriate for continued rehabilitative care in the following setting: CIR to decrease burden of care. Patient has agreed to participate in recommended program. Potentially Note that insurance prior authorization may be required for reimbursement for recommended care.  Comment: Rehab Admissions Coordinator to follow up.   I have personally performed a face to face diagnostic evaluation, including, but not limited to relevant history and physical exam findings, of this patient and developed relevant assessment and plan.  Additionally, I have reviewed and concur with the physician assistant's documentation above.   Delice Lesch, MD, ABPMR Bary Leriche, PA-C 05/21/2018

## 2018-05-22 ENCOUNTER — Inpatient Hospital Stay (HOSPITAL_COMMUNITY): Payer: Medicaid Other

## 2018-05-22 DIAGNOSIS — I609 Nontraumatic subarachnoid hemorrhage, unspecified: Secondary | ICD-10-CM

## 2018-05-22 NOTE — Progress Notes (Signed)
Occupational Therapy Treatment Patient Details Name: [name] [name] MRN: [id number] DOB: Dec 07, 1985 Today's Date: 05/22/2018    History of present illness Pt is a 32 y/o female  with history of HTN admitted on 05/09/18 for evaluation of headache.  Found to have a [place] and taken to OR on 05/10/18 for left pterional craniotomy for clipping of anterior communicating artery aneurysm, but remained on the ventilator 11/21-11/27. 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    OT comments  Patient supine in bed and agreeable to OT/PT session.  Patient completes bed mobility with max assist +2, sitting EOB with at best mod assist +2 safety and at most max assist +2 safety.  Engaged in weight shifting and weight bearing exercises at EOB, noted posterior lean preference, some pushing with L towards R and R lean with increased fatigue.  Squat pivot towards L to drop arm recliner with max assist +2.  Pt able to follow commands with increasing consistency today, engaged and eager to participate throughout session.  Beginning to verbalize with encouragement. Will continue to follow.  Continue to recommend CIR.      Follow Up Recommendations  CIR    Equipment Recommendations  Other (comment)(TBD )    Recommendations for Other Services Rehab consult    Precautions / Restrictions Precautions Precautions: Fall Precaution Comments: right hemi, pushes right Restrictions Weight Bearing Restrictions: No       Mobility Bed Mobility Overal bed mobility: Needs Assistance Bed Mobility: Rolling;Sidelying to Sit Rolling: Mod assist;Max assist;+2 for physical assistance Sidelying to sit: Max assist;+2 for physical assistance       General bed mobility comments: rolling towards R side mod assist, towards L side max assist, able to assist with L UE/LE but requires cueing for sequencing and coordination; max assist +2 to transition from sidelying to sitting   Transfers Overall  transfer level: Needs assistance Equipment used: None Transfers: Sit to/from [employer] Transfers Sit to Stand: +2 physical assistance;Max assist   Squat pivot transfers: Max assist;+2 safety/equipment;+2 physical assistance     General transfer comment: sit to stand x 1 with max assist +2 in order to position bed pad, squat pivot with pt assisting with L UE/LE given max assist +2 and cueing for sequencing and coordination    Balance Overall balance assessment: Needs assistance Sitting-balance support: Bilateral upper extremity supported;Feet supported Sitting balance-Leahy Scale: Poor Sitting balance - Comments: 0-2 hand support EOB with mod assist at best and max assist when pushing with L UE  Postural control: Posterior lean;Right lateral lean Standing balance support: Bilateral upper extremity supported;During functional activity Standing balance-Leahy Scale: Zero Standing balance comment: two person max assist to support trunk, pt using left leg and left hand to assist in stabilizing herself and powering up.  Right leg blocked.  Assist needed to keep pt's left foot positioning flat on the floor as well.                            ADL either performed or assessed with clinical judgement   ADL Overall ADL's : Needs assistance/impaired                         Toilet Transfer: Maximal assistance;+2 for physical assistance;+2 for safety/equipment;Squat-pivot Toilet Transfer Details (indicate cue type and reason): simulated to recliner  Toileting- Clothing Manipulation and Hygiene: Total assistance;+2 for physical assistance;Bed level Toileting - Clothing Manipulation Details (  indicate cue type and reason): total assist +3 to clean up after recal tube leaking in bed              Vision   Additional Comments: continue assessment, visual scanning throughout room; reports "no" to "do you see two of me?"   Perception     Praxis      Cognition  Arousal/Alertness: Awake/alert Behavior During Therapy: Flat affect Overall Cognitive Status: Difficult to assess Area of Impairment: Attention;Memory;Following commands;Safety/judgement;Awareness;Problem solving                   Current Attention Level: Sustained Memory: Decreased short-term memory Following Commands: Follows one step commands inconsistently;Follows one step commands with increased time Safety/Judgement: Decreased awareness of safety;Decreased awareness of deficits Awareness: Intellectual Problem Solving: Slow processing;Decreased initiation;Difficulty sequencing;Requires verbal cues;Requires tactile cues General Comments: pt able to follow 1 step commands given increased time, beginning to verbalize a little with encouragement        Exercises Exercises: Other exercises Other Exercises Other Exercises: weightbearing through R hand at EOB, therapist supporting at elbow Other Exercises: weightshifting and weightbearing at EOB to bear through forearms with max assist +2    Shoulder Instructions       General Comments VSS    Pertinent Vitals/ Pain       Pain Assessment: No/denies pain Faces Pain Scale: No hurt  Home Living                                          Prior Functioning/Environment              Frequency  Min 3X/week        Progress Toward Goals  OT Goals(current goals can now be found in the care plan section)  Progress towards OT goals: Progressing toward goals  Acute Rehab OT Goals Patient Stated Goal: none stated Time For Goal Achievement: 06/01/18 Potential to Achieve Goals: Good  Plan Discharge plan remains appropriate;Frequency remains appropriate    Co-evaluation    PT/OT/SLP Co-Evaluation/Treatment: Yes Reason for Co-Treatment: Complexity of the patient's impairments (multi-system involvement);Necessary to address cognition/behavior during functional activity;For patient/therapist safety;To  address functional/ADL transfers PT goals addressed during session: Mobility/safety with mobility;Balance;Strengthening/ROM OT goals addressed during session: ADL's and self-care;Strengthening/ROM;Other (comment)(mobility)      AM-PAC OT "6 Clicks" Daily Activity     Outcome Measure   Help from another person eating meals?: Total Help from another person taking care of personal grooming?: A Lot Help from another person toileting, which includes using toliet, bedpan, or urinal?: Total Help from another person bathing (including washing, rinsing, drying)?: A Lot Help from another person to put on and taking off regular upper body clothing?: A Lot Help from another person to put on and taking off regular lower body clothing?: Total 6 Click Score: 9    End of Session Equipment Utilized During Treatment: Gait belt  OT Visit Diagnosis: Other abnormalities of gait and mobility (R26.89);Muscle weakness (generalized) (M62.81);Other symptoms and signs involving cognitive function;Cognitive communication deficit (R41.841);Hemiplegia and hemiparesis Symptoms and signs involving cognitive functions: Nontraumatic intracerebral hemorrhage Hemiplegia - Right/Left: Right Hemiplegia - dominant/non-dominant: Dominant Hemiplegia - caused by: Nontraumatic intracerebral hemorrhage   Activity Tolerance Patient tolerated treatment well   Patient Left in chair;with chair alarm set;with call bell/phone within reach   Nurse Communication Mobility status        Time:  1625-1700 OT Time Calculation (min): 35 min  Charges: OT General Charges $OT Visit: 1 Visit OT Treatments $Self Care/Home Management : 8-22 mins  Delight Stare, [place] Pager [phone] Office [phone]    Delight Stare 05/22/2018, 5:25 PM

## 2018-05-22 NOTE — Progress Notes (Signed)
Transcranial Doppler  Date POD PCO2 HCT BP  MCA ACA PCA OPHT SIPH VERT Basilar  11/27 JE     Right  Left   101  28   -47  *   25  *   20  31   52  53   -47  -57   -32      11/29 JE     Right  Left   88  52   -36  *   19  *   32  23   79  45   *  *   *      12/2 RS     Right  Left                                       12/4 JE      Right  Left   40  *   *  *   *  *   47  38   45  51   -19  -16   -44            Right  Left                                            Right  Left                                            Right  Left                                        MCA = Middle Cerebral Artery      OPHT = Opthalmic Artery     BASILAR = Basilar Artery   ACA = Anterior Cerebral Artery     SIPH = Carotid Siphon PCA = Posterior Cerebral Artery   VERT = Verterbral Artery                   Normal MCA = 62+\-12 ACA = 50+\-12 PCA = 42+\-23   ^^^ limited left temp window is due to bandage location. JE 05/22/2018

## 2018-05-22 NOTE — Progress Notes (Signed)
  Speech Language Pathology Treatment: Cognitive-Linquistic  Patient Details Name: [name] [name] MRN: [id number] DOB: Feb 17, 1986 Today's Date: 05/22/2018 Time: 8185-9093 SLP Time Calculation (min) (ACUTE ONLY): 29 min  Assessment / Plan / Recommendation Clinical Impression  Skilled treatment session focused on functional communication. With extra time, patient able to initiate verbal expression at the word and phrase level in regards to basic and biographical information. Patient performed simple automatic sequences with extra time but demonstrated difficulty with more complex automatic sequences like the months of the year. Patient also required overall Mod A verbal and phonemic cues for word-finding during a phrase completion task. Patient's speech was low in vocal intensity but 100% intelligible without evidence of consonant distortions. Overall, patient demonstrates increased initiation of verbal expression with informal, conversational tasks vs structured and confrontational tasks. Verbal expression also appeared to be impacted by impaired sustained attention throughout session. Patient is making excellent gains towards communication goals and would benefit from inpatient rehab to maximize functional conversation prior to discharge.    HPI HPI: 32 year old female admitted 11/21 with SAH and taken to OR for clipping 11/22, but remained on the ventilator in the post-operative setting. Intubated 11/21-11/27. 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 CXR shows Left basilar atelectasis with associated effusion.      SLP Plan  Continue with current plan of care       Recommendations                   Oral Care Recommendations: Oral care BID Follow up Recommendations: Inpatient Rehab SLP Visit Diagnosis: Aphasia (R47.01) Plan: Continue with current plan of care       [place], [place] 05/22/2018, 3:21  PM

## 2018-05-22 NOTE — Progress Notes (Signed)
Inpatient Rehabilitation Admissions Coordinator  I met with patient at bedside for assessment. Pt verbalized that she has two children to correct me that I thought she had 3 children. Laughed appropriately when I discussed her surgery and her "weave" and surgical incision with staples. I explained my purpose for visiting and that I would contact her Mom to discuss her rehab needs.  I contacted pt's Mom by phone. Mom states that she and pt's Aunt work during the week. Patient's grandmother is caring for her 23 month old and soon to be 5 year old children during the week. Mom and Aunt caring for her children during the weekend. I discussed goals and expectations of an inpt rehab admit as well as projected caregiver support after a short rehab stay of 2 to 3 weeks. Mom states caregiver support 24/7 at a wheelchair level is not available at this time and is requesting SNF after an inpt rehab admission. I states that I will discuss with our Rehab MD and follow up tomorrow if we can admit. We also discussed SNF placement which may not be available locally and could be anywhere in the state of [place].  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   I contacted SW, Jesse, and will follow up tomorrow with rehab venue options.  Barbara Boyette, RN, MSN Rehab Admissions Coordinator [phone] 05/22/2018 1:02 PM  

## 2018-05-22 NOTE — Progress Notes (Signed)
  NEUROSURGERY PROGRESS NOTE   Pt seen and examined. No issues overnight.   EXAM: Temp:  [97.9 F (36.6 C)-99.5 F (37.5 C)] 97.9 F (36.6 C) (12/04 1200) Pulse Rate:  [64-105] 105 (12/04 1500) Resp:  [14-24] 23 (12/04 1500) BP: (119-149)/(70-100) 146/90 (12/04 1500) SpO2:  [97 %-100 %] 100 % (12/04 1500) Weight:  [96.4 kg] 96.4 kg (12/04 0200) Intake/Output      12/03 0701 - 12/04 0700 12/04 0701 - 12/05 0700   P.O. 240    I.V. (mL/kg) 1131.8 (11.7) 140.4 (1.5)   NG/GT     IV Piggyback 200    Total Intake(mL/kg) 1571.8 (16.3) 140.4 (1.5)   Urine (mL/kg/hr) 1949 (0.8)    Stool 0    Total Output 1949    Net -377.2 +140.4         Awake, alert Can say a few words when asked - "ok", "good." Moves LUE/LLE well Wiggles fingers right hand, no voluntary movement right leg Wound c/d/i  LABS: Lab Results  Component Value Date   CREATININE 0.57 05/18/2018   BUN 13 05/18/2018   NA 139 05/18/2018   K 4.4 05/18/2018   CL 105 05/18/2018   CO2 23 05/18/2018   Lab Results  Component Value Date   WBC 7.3 05/18/2018   HGB 10.7 (L) 05/18/2018   HCT 33.9 (L) 05/18/2018   MCV 99.4 05/18/2018   PLT 349 05/18/2018    IMPRESSION: - 32 y.o. female [place] d# 13 s/p clipping Acom aneurysm with left ACA stroke. Has improved somewhat with some flickers of movement RUE and now speaking some words.  PLAN: - Will observe for another day or two, plan on CIR later this week if stable

## 2018-05-22 NOTE — Progress Notes (Addendum)
Physical Therapy Treatment Patient Details Name: David Towson MRN: [id number] DOB: 03-Jul-1985 Today's Date: 05/22/2018    History of Present Illness Pt is a 32 y/o female  with history of HTN admitted on 05/09/18 for evaluation of headache.  Found to have a [place] and taken to OR on 05/10/18 for left pterional craniotomy for clipping of anterior communicating artery aneurysm, but remained on the ventilator 11/21-11/27. 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     PT Comments    Pt participated well during co session with PT/OT this PM.  She was able to follow most basic commands given increased time to process, participated well in sitting balance EOB and even assisted a bit more in a squat pivot transfer to the drop arm recliner chair.  PT will continue to follow acutely for safe mobility progression   Follow Up Recommendations  CIR     Equipment Recommendations  Wheelchair (measurements PT);Wheelchair cushion (measurements PT);3in1 (PT);Other (comment)(drop arm BSC)    Recommendations for Other Services   NA     Precautions / Restrictions Precautions Precautions: Fall Precaution Comments: right hemi, pushes right Restrictions Weight Bearing Restrictions: No    Mobility  Bed Mobility Overal bed mobility: Needs Assistance Bed Mobility: Rolling;Sidelying to Sit Rolling: Mod assist;Max assist;+2 for physical assistance Sidelying to sit: Max assist;+2 for physical assistance       General bed mobility comments: rolling towards R side mod assist, towards L side max assist, able to assist with L UE/LE but requires cueing for sequencing and coordination; max assist +2 to transition from sidelying to sitting   Transfers Overall transfer level: Needs assistance Equipment used: None Transfers: Sit to/from [employer] Transfers Sit to Stand: +2 physical assistance;Max assist   Squat pivot transfers: Max assist;+2 safety/equipment;+2  physical assistance     General transfer comment: sit to stand x 1 with max assist +2 in order to position bed pad, squat pivot with pt assisting with L UE/LE given max assist +2 and cueing for sequencing and coordination      Balance Overall balance assessment: Needs assistance Sitting-balance support: Bilateral upper extremity supported;Feet supported Sitting balance-Leahy Scale: Poor Sitting balance - Comments: 0-2 hand support EOB with mod assist at best and max assist when pushing with L UE  Postural control: Posterior lean;Right lateral lean Standing balance support: Bilateral upper extremity supported;During functional activity Standing balance-Leahy Scale: Zero Standing balance comment: two person max assist to support trunk, pt using left leg and left hand to assist in stabilizing herself and powering up.  Right leg blocked.  Assist needed to keep pt's left foot positioning flat on the floor as well.                             Cognition Arousal/Alertness: Awake/alert Behavior During Therapy: Flat affect Overall Cognitive Status: Difficult to assess Area of Impairment: Attention;Memory;Following commands;Safety/judgement;Awareness;Problem solving                   Current Attention Level: Sustained Memory: Decreased short-term memory Following Commands: Follows one step commands inconsistently;Follows one step commands with increased time Safety/Judgement: Decreased awareness of safety;Decreased awareness of deficits Awareness: Intellectual Problem Solving: Slow processing;Decreased initiation;Difficulty sequencing;Requires verbal cues;Requires tactile cues General Comments: pt able to follow 1 step commands given increased time, beginning to verbalize a little with encouragement         General Comments General comments (skin integrity, edema, etc.):  VSS      Pertinent Vitals/Pain Pain Assessment: No/denies pain Faces Pain Scale: No hurt            PT Goals (current goals can now be found in the care plan section) Acute Rehab PT Goals Patient Stated Goal: none stated Progress towards PT goals: Progressing toward goals    Frequency    Min 4X/week      PT Plan Frequency needs to be updated    Co-evaluation PT/OT/SLP Co-Evaluation/Treatment: Yes Reason for Co-Treatment: Complexity of the patient's impairments (multi-system involvement);Necessary to address cognition/behavior during functional activity;For patient/therapist safety;To address functional/ADL transfers PT goals addressed during session: Mobility/safety with mobility;Balance;Strengthening/ROM OT goals addressed during session: ADL's and self-care;Strengthening/ROM;Other (comment)(mobility)      AM-PAC PT "6 Clicks" Mobility   Outcome Measure  Help needed turning from your back to your side while in a flat bed without using bedrails?: Total Help needed moving from lying on your back to sitting on the side of a flat bed without using bedrails?: Total Help needed moving to and from a bed to a chair (including a wheelchair)?: Total Help needed standing up from a chair using your arms (e.g., wheelchair or bedside chair)?: Total Help needed to walk in hospital room?: Total Help needed climbing 3-5 steps with a railing? : Total 6 Click Score: 6    End of Session Equipment Utilized During Treatment: Gait belt Activity Tolerance: Patient tolerated treatment well Patient left: in chair;with call bell/phone within reach;with chair alarm set Nurse Communication: Mobility status;Need for lift equipment PT Visit Diagnosis: Other abnormalities of gait and mobility (R26.89);Hemiplegia and hemiparesis Hemiplegia - Right/Left: Right Hemiplegia - dominant/non-dominant: Dominant Hemiplegia - caused by: Other Nontraumatic intracranial hemorrhage     Time: 6301-6010 PT Time Calculation (min) (ACUTE ONLY): 42 min  Charges:  $Therapeutic Activity: 8-22 mins                     [place] Neddo B. Jeston Junkins, PT, DPT  Acute Rehabilitation [phone] pager #[phone] office   05/22/2018, 5:27 PM

## 2018-05-22 NOTE — NC FL2 (Signed)
[place] MEDICAID FL2 LEVEL OF CARE SCREENING TOOL     IDENTIFICATION  Patient Name: [name] [name] Birthdate: [birth year] Sex: female Admission Date (Current Location): 05/09/2018  [facility] and [place] Number:  Herbalist and Address:  [facility], [place] [address] [place], [place] [address]      Provider Number: [id number]  Attending Physician Name and Address:  Consuella Lose, MD  Relative Name and Phone Number:       Current Level of Care: Hospital Recommended Level of Care: Other (Comment)(CIR) Prior Approval Number:    Date Approved/Denied:   PASRR Number: [id number]  Discharge Plan: Other (Comment)(CIR)    Current Diagnoses: Patient Active Problem List   Diagnosis Date Noted  . SAH (subarachnoid hemorrhage) ([place])   . Acute cerebral infarction ([place])   . HCAP (healthcare-associated pneumonia)   . Benign essential HTN   . Acute blood loss anemia   . Right hemiplegia ([place])   . Subarachnoid hemorrhage from anterior communicating artery aneurysm ([place])   . Endotracheal tube present   . Brain aneurysm   . Subarachnoid hemorrhage ([place]) 05/09/2018  . Gastroesophageal reflux disease without esophagitis 10/09/2016  . BMI 31.0-31.9,adult 07/20/2016  . Obesity in pregnancy, antepartum 07/20/2016  . Tobacco abuse 06/01/2015  . Marijuana use 06/23/2014    Orientation RESPIRATION BLADDER Height & Weight     Self(pt is mute)  Normal Incontinent(05/15/18) Weight: 212 lb 8.4 oz (96.4 kg) Height:  5\' 10"  (177.8 cm)  BEHAVIORAL SYMPTOMS/MOOD NEUROLOGICAL BOWEL NUTRITION STATUS      Incontinent(rectal tube 05/14/18) Diet(regular diet thin liquids)  AMBULATORY STATUS COMMUNICATION OF NEEDS Skin   Total Care Verbally Surgical wounds(Closed incision, head, no dressing)                       Personal Care Assistance Level of Assistance  Bathing, Feeding, Dressing Bathing Assistance: Maximum assistance Feeding assistance: Limited  assistance Dressing Assistance: Maximum assistance     Functional Limitations Info  Sight, Hearing, Speech Sight Info: Adequate Hearing Info: Adequate Speech Info: Adequate(pt is mute)    SPECIAL CARE FACTORS FREQUENCY  PT (By licensed PT), OT (By licensed OT)     PT Frequency: 3x OT Frequency: 3x            Contractures Contractures Info: Not present    Additional Factors Info  Code Status, Allergies Code Status Info: Full Code Allergies Info: Sulfa Antibiotics, Shellfish Allergy           Current Medications (05/22/2018):  This is the current hospital active medication list Current Facility-Administered Medications  Medication Dose Route Frequency Provider Last Rate Last Dose  . 0.9 %  sodium chloride infusion   Intravenous PRN Chesley Mires, MD   Stopped at 05/16/18 0919  . acetaminophen (TYLENOL) tablet 650 mg  650 mg Oral Q4H PRN Erline Levine, MD       Or  . acetaminophen (TYLENOL) suppository 650 mg  650 mg Rectal Q4H PRN Erline Levine, MD      . amLODipine (NORVASC) tablet 10 mg  10 mg Oral Daily Consuella Lose, MD   10 mg at 05/22/18 9937  . famotidine (PEPCID) 40 MG/5ML suspension 20 mg  20 mg Oral QHS Consuella Lose, MD   20 mg at 05/21/18 2206  . feeding supplement (ENSURE ENLIVE) (ENSURE ENLIVE) liquid 237 mL  237 mL Oral BID BM Consuella Lose, MD   237 mL at 05/22/18 0944  . hydrALAZINE (APRESOLINE) injection  5-20 mg  5-20 mg Intravenous Q4H PRN Judith Part, MD      . loratadine (CLARITIN) tablet 10 mg  10 mg Oral Daily Consuella Lose, MD   10 mg at 05/22/18 0938  . montelukast (SINGULAIR) tablet 10 mg  10 mg Oral QHS Consuella Lose, MD   10 mg at 05/21/18 2206  . multivitamin with minerals tablet 1 tablet  1 tablet Oral Daily Rae Mar, RPH   1 tablet at 05/22/18 6015  . niMODipine (NIMOTOP) capsule 60 mg  60 mg Oral Q4H Laqueta Linden A, RPH       Or  . niMODipine (NYMALIZE) 60 MG/20ML oral solution 60 mg  60 mg Oral  Q4H Laqueta Linden A, RPH   60 mg at 05/22/18 6153  . ondansetron (ZOFRAN) injection 4 mg  4 mg Intravenous Q6H PRN Erline Levine, MD   4 mg at 05/15/18 2257     Discharge Medications: Please see discharge summary for a list of discharge medications.  Relevant Imaging Results:  Relevant Lab Results:   Additional Information SSN: [id number]  Eileen Stanford, LCSW

## 2018-05-23 NOTE — Progress Notes (Signed)
Physical Therapy Treatment Patient Details Name: [name] [name] MRN: [id number] DOB: May 04, 1986 Today's Date: 05/23/2018    History of Present Illness Pt is a 32 y/o female  with history of HTN admitted on 05/09/18 for evaluation of headache.  Found to have a [place] and taken to OR on 05/10/18 for left pterional craniotomy for clipping of anterior communicating artery aneurysm, but remained on the ventilator 11/21-11/27. 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     PT Comments    Patient's tolerance to treatment today was good.  Patient was in bed with no visitors present upon PT arrival.  Patient was pleasant and agreeable to therapy.  Patient was able to roll several times today while therapist providing pericare.  She was able to assist in rolling towards right side by gripping bed railing with L hand.  Patient continues to require max A +2 for transfers and VC for proper hand placement.  Patient remains a good candidate for CIR placement at this time based on current functional status.    Follow Up Recommendations  CIR     Equipment Recommendations  Wheelchair (measurements PT);Wheelchair cushion (measurements PT);3in1 (PT);Other (comment)(drop arm BSC)    Recommendations for Other Services Rehab consult     Precautions / Restrictions Precautions Precautions: Fall Precaution Comments: right hemi, pushes right Restrictions Weight Bearing Restrictions: No    Mobility  Bed Mobility Overal bed mobility: Needs Assistance Bed Mobility: Rolling;Sidelying to Sit Rolling: Mod assist;Max assist;+2 for physical assistance Sidelying to sit: Max assist;+2 for physical assistance       General bed mobility comments: Pt required mod assist to roll towards right side but was able to reach for bed railing with L UE for assistance.  Pt required max assist to roll towards left due to non use of R UE.  Pt required VC and mod A to move B LE over EOB.  Max A+2  to boost into sitting EOB.  Transfers Overall transfer level: Needs assistance Equipment used: None Transfers: Squat Pivot Transfers     Squat pivot transfers: Max assist;+2 safety/equipment;+2 physical assistance     General transfer comment: Pt required verbal and tactile cueing for correct placement of L hand prior to squat pivot to chair.  Therapist facilitated transfer with use of bed pad. Pt demonstrated tendency to resist transfer with L hand but was able to complete the squat pivot when L hand positioned away from her body.  VC provided for sequencing and coordination.  Ambulation/Gait             General Gait Details: Not able   Stairs             Wheelchair Mobility    Modified Rankin (Stroke Patients Only) Modified Rankin (Stroke Patients Only) Pre-Morbid Rankin Score: No symptoms Modified Rankin: Severe disability     Balance Overall balance assessment: Needs assistance Sitting-balance support: Bilateral upper extremity supported;Feet supported Sitting balance-Leahy Scale: Poor Sitting balance - Comments: Pt requires external assist when sitting EOB.  Able to support herself with L UE.                                    Cognition Arousal/Alertness: Awake/alert Behavior During Therapy: Flat affect Overall Cognitive Status: Difficult to assess Area of Impairment: Attention;Memory;Following commands;Safety/judgement;Awareness;Problem solving                   Current  Attention Level: Sustained Memory: Decreased short-term memory Following Commands: Follows one step commands inconsistently;Follows one step commands with increased time Safety/Judgement: Decreased awareness of safety;Decreased awareness of deficits Awareness: Intellectual Problem Solving: Slow processing;Decreased initiation;Difficulty sequencing;Requires verbal cues;Requires tactile cues General Comments: pt able to follow 1 step commands given increased time,  beginning to verbalize a little with encouragement      Exercises      General Comments        Pertinent Vitals/Pain Pain Assessment: No/denies pain    Home Living                      Prior Function            PT Goals (current goals can now be found in the care plan section) Acute Rehab PT Goals Patient Stated Goal: none stated PT Goal Formulation: Patient unable to participate in goal setting Time For Goal Achievement: 06/01/18 Potential to Achieve Goals: Fair Progress towards PT goals: Progressing toward goals    Frequency    Min 4X/week      PT Plan Frequency needs to be updated    Co-evaluation              AM-PAC PT "6 Clicks" Mobility   Outcome Measure  Help needed turning from your back to your side while in a flat bed without using bedrails?: A Lot Help needed moving from lying on your back to sitting on the side of a flat bed without using bedrails?: Total Help needed moving to and from a bed to a chair (including a wheelchair)?: Total Help needed standing up from a chair using your arms (e.g., wheelchair or bedside chair)?: Total Help needed to walk in hospital room?: Total Help needed climbing 3-5 steps with a railing? : Total 6 Click Score: 7    End of Session Equipment Utilized During Treatment: Gait belt Activity Tolerance: Patient tolerated treatment well Patient left: in chair;with call bell/phone within reach;with chair alarm set Nurse Communication: Mobility status;Need for lift equipment PT Visit Diagnosis: Other abnormalities of gait and mobility (R26.89);Hemiplegia and hemiparesis Hemiplegia - Right/Left: Right Hemiplegia - dominant/non-dominant: Dominant Hemiplegia - caused by: Other Nontraumatic intracranial hemorrhage     Time: 2119-4174 PT Time Calculation (min) (ACUTE ONLY): 20 min  Charges:  $Therapeutic Activity: 8-22 mins                     [address], Sharolyn Douglas 05/23/2018, 5:47  PM

## 2018-05-23 NOTE — Progress Notes (Signed)
Inpatient Rehabilitation Admissions Coordinator  I do not have a bed available for this patient today. I have notified RN CM and bedside RN as well as called pt's Mom by phone. I explained to her Mom that I can not guarantee a Bed for CIR admit before she discharges to SNF which is her eventual dispo for prolonged recuperation. I have also notified RN CM of this today and discussed with SW yesterday.  Danne Baxter, RN, MSN Rehab Admissions Coordinator [phone] 05/23/2018 1:11 PM

## 2018-05-23 NOTE — Care Management Note (Signed)
Case Management Note  Patient Details  Name: [name] [name] MRN: [id number] Date of Birth: 1986/05/30  Subjective/Objective:  Pt admitted 11/21 with SAH and taken to OR for clipping 11/22, but remained on the ventilator in the post-operative setting. Intubated 11/21-11/27. 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  PTA, pt resided at home with her two young children.                   Action/Plan: PT/OT recommending CIR, though no bed currently available, per admissions coordinator.  Pt has been faxed out for SNF as backup, though currently no bed offers.  Will continue to follow with updates as they are available.    Expected Discharge Date:                  Expected Discharge Plan:  IP Rehab Facility  In-House Referral:  Clinical Social Work  Discharge planning Services  CM Consult  Post Acute Care Choice:    Choice offered to:     DME Arranged:    DME Agency:     HH Arranged:    [place] Agency:     Status of Service:  In process, will continue to follow  If discussed at Long Length of Stay Meetings, dates discussed:    Additional Comments:  Reinaldo Raddle, RN, BSN  Trauma/Neuro ICU Case Manager [phone]

## 2018-05-24 ENCOUNTER — Inpatient Hospital Stay (HOSPITAL_COMMUNITY): Payer: Medicaid Other

## 2018-05-24 DIAGNOSIS — I609 Nontraumatic subarachnoid hemorrhage, unspecified: Secondary | ICD-10-CM

## 2018-05-24 NOTE — Progress Notes (Signed)
Transcranial Doppler  Date POD PCO2 HCT BP  MCA ACA PCA OPHT SIPH VERT Basilar  11/27 JE     Right  Left   101  28   -47  *   25  *   20  31   52  53   -47  -57   -32      11/29 JE     Right  Left   88  52   -36  *   19  *   32  23   79  45   *  *   *      12/2 RS     Right  Left                                       12/4 JE      Right  Left   40  *   *  *   *  *   78  29   56  21   -19  -16   -44       12/6 MS     Right  Left   29  23   *  *   *  *   27  28   24  24    -27  -29   -40           Right  Left                                            Right  Left                                        MCA = Middle Cerebral Artery      OPHT = Opthalmic Artery     BASILAR = Basilar Artery   ACA = Anterior Cerebral Artery     SIPH = Carotid Siphon PCA = Posterior Cerebral Artery   VERT = Verterbral Artery                   Normal MCA = 62+\-12 ACA = 50+\-12 PCA = 42+\-23   ^^^ limited left temp window is due to bandage location. JE 05/24/2018 *Unable to insonate. 05/24/2018 1:23 PM Maudry Mayhew, MHA, RVT, RDCS, RDMS

## 2018-05-24 NOTE — Progress Notes (Addendum)
Inpatient Rehabilitation Admissions Coordinator  I have discussed case with Dr. Naaman Plummer, Dr Kathyrn Sheriff and to Georga Kaufmann of Care management. We would like to admit patient for a short inpt rehab stay prior to d/c to SNF to decrease burden of care for patient could benefit. We await bed availability as well as I have discussed with Georga Kaufmann the needed assistance to locate the SNF for next level of care after an inpt rehab admit. I will follow up with team once inpt rehab bed is available.  Danne Baxter, RN, MSN Rehab Admissions Coordinator [phone] 05/24/2018 1:02 PM

## 2018-05-24 NOTE — Progress Notes (Signed)
Physical Therapy Treatment Patient Details Name: [name] [name] MRN: [id number] DOB: 11/28/1985 Today's Date: 05/24/2018    History of Present Illness Pt is a 32 y/o female  with history of HTN admitted on 05/09/18 for evaluation of headache.  Found to have a [place] and taken to OR on 05/10/18 for left pterional craniotomy for clipping of anterior communicating artery aneurysm, but remained on the ventilator 11/21-11/27. 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     PT Comments    Pt agreeable to therapy, remains limited with communication but is smiling more today. Pt completed bed mobility MinA for rolling and ModA for coming to upright, assistance needed on right side. Pt transferred to chair with use of Stedy today, MaxAx2 for sit to stand, able to hold herself up with use of LUE and some assist while Stedy was placed. Verbal and tactile cuing needed for hand placement and sequencing with completion of all mobility. DC plan remains appropriate, PT will continue to follow acutely.   Follow Up Recommendations  CIR     Equipment Recommendations  Wheelchair (measurements PT);Wheelchair cushion (measurements PT);3in1 (PT);Other (comment)       Precautions / Restrictions Precautions Precautions: Fall Precaution Comments: right hemi, pushes right Restrictions Weight Bearing Restrictions: No    Mobility  Bed Mobility Overal bed mobility: Needs Assistance Bed Mobility: Rolling;Sidelying to Sit Rolling: Min assist Sidelying to sit: Mod assist       General bed mobility comments: Pt minA to roll towards right side, good use of LUE on bedrail to pull herself over with vc. ModA provided on right side to come to sit from sidelying, heavy leaning towards right side, vc for left hand placement and to correct postural lean.  Transfers Overall transfer level: Needs assistance   Transfers: Stand Pivot Transfers;Sit to/from Stand Sit to Stand: Max assist;+2  physical assistance;From elevated surface Stand pivot transfers: (use of Stedy)       General transfer comment: Pt required verbal and tactile cuing for correct hand and foot placement for setup on the Stedy to perform stand pivot transfer. Pt completing sit to stand MaxAx2 for assistance with power up, heavy leaning towards right. Pt able to hold herself upright with MinA for a couple seconds while Stedy was placed and she was transferred to chair. vc for hand placement with sit from stand.   Modified Rankin (Stroke Patients Only) Modified Rankin (Stroke Patients Only) Pre-Morbid Rankin Score: No symptoms Modified Rankin: Severe disability     Balance Overall balance assessment: Needs assistance Sitting-balance support: Bilateral upper extremity supported;Feet supported Sitting balance-Leahy Scale: Poor Sitting balance - Comments: Pt requires ModA to maintain postural control when sitting EOB.  Progressed to supervision briefly, able to support herself with L UE. Postural control: Posterior lean;Right lateral lean Standing balance support: Bilateral upper extremity supported;During functional activity Standing balance-Leahy Scale: Zero Standing balance comment: two person max assist to support trunk, pt using left leg and left hand to assist in stabilizing herself and powering up.  Right leg blocked.                              Cognition Arousal/Alertness: Awake/alert Behavior During Therapy: Flat affect Overall Cognitive Status: Difficult to assess Area of Impairment: Attention;Memory;Following commands;Safety/judgement;Awareness;Problem solving                   Current Attention Level: Sustained Memory: Decreased short-term memory Following Commands: Follows  one step commands inconsistently;Follows one step commands with increased time Safety/Judgement: Decreased awareness of safety;Decreased awareness of deficits Awareness: Intellectual Problem Solving:  Slow processing;Decreased initiation;Difficulty sequencing;Requires verbal cues;Requires tactile cues General Comments: pt able to follow 1 step commands given increased time, verbalizes a little with encouragement      Exercises Other Exercises Other Exercises: Worked on postural control at EOB. ModA for stability, cuing to lean to the left and correct for right lateral lean. Progressed from [place] to supervision for brief moment as pt maintained balance without use of LUE. Pt completed seated weight shifts to right elbow and left elbow, holding for ~30 sec each direction 2 times. ModA and verbal and tactile cuing needed to complete weight shifts.      General Comments        Pertinent Vitals/Pain Pain Assessment: Faces Faces Pain Scale: No hurt Pain Intervention(s): Limited activity within patient's tolerance;Monitored during session;Repositioned           PT Goals (current goals can now be found in the care plan section) Acute Rehab PT Goals PT Goal Formulation: Patient unable to participate in goal setting Time For Goal Achievement: 06/01/18 Potential to Achieve Goals: Fair Progress towards PT goals: Progressing toward goals    Frequency    Min 4X/week      PT Plan Current plan remains appropriate       AM-PAC PT "6 Clicks" Mobility   Outcome Measure  Help needed turning from your back to your side while in a flat bed without using bedrails?: A Lot Help needed moving from lying on your back to sitting on the side of a flat bed without using bedrails?: A Lot Help needed moving to and from a bed to a chair (including a wheelchair)?: Total Help needed standing up from a chair using your arms (e.g., wheelchair or bedside chair)?: Total Help needed to walk in hospital room?: Total Help needed climbing 3-5 steps with a railing? : Total 6 Click Score: 8    End of Session Equipment Utilized During Treatment: Gait belt Activity Tolerance: Patient tolerated treatment  well Patient left: in chair;with call bell/phone within reach;with chair alarm set Nurse Communication: Mobility status;Need for lift equipment PT Visit Diagnosis: Other abnormalities of gait and mobility (R26.89);Hemiplegia and hemiparesis Hemiplegia - Right/Left: Right Hemiplegia - dominant/non-dominant: Dominant Hemiplegia - caused by: Other Nontraumatic intracranial hemorrhage     Time: 6803-2122 PT Time Calculation (min) (ACUTE ONLY): 26 min  Charges:  $Therapeutic Activity: 8-22 mins $Neuromuscular Re-education: 8-22 mins                     Vernell Morgans, SPT Acute Rehabilitation Services Office [phone]    Vernell Morgans 05/24/2018, 5:53 PM

## 2018-05-24 NOTE — Progress Notes (Signed)
  NEUROSURGERY PROGRESS NOTE   No issues overnight.   EXAM:  BP 118/74   Pulse 63   Temp 98.9 F (37.2 C)   Resp 18   Ht 5\' 10"  (1.778 m)   Wt 94.3 kg   LMP 05/03/2018   SpO2 100%   BMI 29.83 kg/m   Awake, alert CN grossly intact  5/5 LUE/LLE Trace movement right hand, no voluntary movement LLE Wound c/d/i  IMPRESSION:  32 y.o. female [name] [name] d# 15 s/p clipping Acom aneurysm with postop ACA stroke. Neurologically stable without clinical signs of spasm. Appears to be stable for transition from acute care setting. Given her young age and improvement over the past week, I think she would be an excellent candidate for CIR.  PLAN: - will speak with inpatient rehab team about CIR admission v SNF. - Cont supportive care

## 2018-05-25 NOTE — Progress Notes (Signed)
Subjective: Patient reports nonverbal but without complaints  Objective: Vital signs in last 24 hours: Temp:  [97.6 F (36.4 C)-99.2 F (37.3 C)] 97.6 F (36.4 C) (12/07 0432) Pulse Rate:  [65-101] 73 (12/07 0700) Resp:  [16-30] 17 (12/07 0700) BP: (115-170)/(77-128) 120/82 (12/07 0700) SpO2:  [90 %-100 %] 99 % (12/07 0700) Weight:  [93.4 kg] 93.4 kg (12/07 0500)  Intake/Output from previous day: 12/06 0701 - 12/07 0700 In: 360 [P.O.:360] Out: 1350 [Urine:1350] Intake/Output this shift: No intake/output data recorded.  awake alert right hemiplegia incision clean dry and intact  Lab Results: No results for input(s): WBC, HGB, HCT, PLT in the last 72 hours. BMET No results for input(s): NA, K, CL, CO2, GLUCOSE, BUN, CREATININE, CALCIUM in the last 72 hours.  Studies/Results: Vas [name] Transcranial Doppler  Result Date: 05/24/2018  Transcranial Doppler Indications: Subarachnoid hemorrhage. Performing Technologist: Maudry Mayhew MHA, RDMS, RVT, RDCS  Examination Guidelines: A complete evaluation includes B-mode imaging, spectral Doppler, color Doppler, and power Doppler as needed of all accessible portions of each vessel. Bilateral testing is considered an integral part of a complete examination. Limited examinations for reoccurring indications may be performed as noted.  +----------+-------------+----------+-----------+------------------+ RIGHT TCD Right VM (cm)Depth (cm)Pulsatility     Comment       +----------+-------------+----------+-----------+------------------+ MCA           29.00                 0.93                       +----------+-------------+----------+-----------+------------------+ ACA                                         Unable to insonate +----------+-------------+----------+-----------+------------------+ Term ICA                                    Unable to insonate +----------+-------------+----------+-----------+------------------+ PCA                                          Unable to insonate +----------+-------------+----------+-----------+------------------+ Opthalmic     27.00                 1.37                       +----------+-------------+----------+-----------+------------------+ ICA siphon    24.00                 0.86                       +----------+-------------+----------+-----------+------------------+ Vertebral    -27.00                 0.81                       +----------+-------------+----------+-----------+------------------+  +----------+------------+----------+-----------+------------------+ LEFT TCD  Left VM (cm)Depth (cm)Pulsatility     Comment       +----------+------------+----------+-----------+------------------+ MCA          23.00                 0.91                       +----------+------------+----------+-----------+------------------+  ACA                                        Unable to insonate +----------+------------+----------+-----------+------------------+ Term ICA                                   Unable to insonate +----------+------------+----------+-----------+------------------+ PCA                                        Unable to insonate +----------+------------+----------+-----------+------------------+ Opthalmic    28.00                 1.39                       +----------+------------+----------+-----------+------------------+ ICA siphon   24.00                 0.82                       +----------+------------+----------+-----------+------------------+ Vertebral    -29.00                0.85                       +----------+------------+----------+-----------+------------------+  +------------+-------+-------+             VM cm/sComment +------------+-------+-------+ Prox Basilar-40.00         +------------+-------+-------+    Preliminary     Assessment/Plan: Overall patient stable we'll take out her staples  still awaiting placement maintains in the ICU for nimotop delivery  LOS: 16 days     Xuan Mateus P 05/25/2018, 8:00 AM

## 2018-05-26 NOTE — Progress Notes (Signed)
Patient ID: [name] [name], female   DOB: 1985/08/27, 33 y.o.   MRN: [id number] No change neurologically patient while awake nonverbal not follow commands base right hemiplegia  Afebrile stable vital signs  Continue supportive care

## 2018-05-27 ENCOUNTER — Inpatient Hospital Stay (HOSPITAL_COMMUNITY)
Admission: RE | Admit: 2018-05-27 | Discharge: 2018-06-24 | DRG: 056 | Disposition: A | Discharge status: Skilled Nursing Facility | Payer: Medicaid Other | Source: Intra-hospital | Attending: Physical Medicine & Rehabilitation | Admitting: Physical Medicine & Rehabilitation

## 2018-05-27 ENCOUNTER — Inpatient Hospital Stay (HOSPITAL_COMMUNITY): Payer: Medicaid Other

## 2018-05-27 ENCOUNTER — Encounter (HOSPITAL_COMMUNITY): Payer: Self-pay | Admitting: Emergency Medicine

## 2018-05-27 ENCOUNTER — Other Ambulatory Visit: Payer: Self-pay

## 2018-05-27 DIAGNOSIS — L309 Dermatitis, unspecified: Secondary | ICD-10-CM | POA: Diagnosis present

## 2018-05-27 DIAGNOSIS — M1711 Unilateral primary osteoarthritis, right knee: Secondary | ICD-10-CM

## 2018-05-27 DIAGNOSIS — R7301 Impaired fasting glucose: Secondary | ICD-10-CM | POA: Diagnosis present

## 2018-05-27 DIAGNOSIS — I69351 Hemiplegia and hemiparesis following cerebral infarction affecting right dominant side: Secondary | ICD-10-CM | POA: Diagnosis not present

## 2018-05-27 DIAGNOSIS — F17210 Nicotine dependence, cigarettes, uncomplicated: Secondary | ICD-10-CM | POA: Diagnosis present

## 2018-05-27 DIAGNOSIS — I69020 Aphasia following nontraumatic subarachnoid hemorrhage: Secondary | ICD-10-CM

## 2018-05-27 DIAGNOSIS — Z882 Allergy status to sulfonamides status: Secondary | ICD-10-CM | POA: Diagnosis not present

## 2018-05-27 DIAGNOSIS — K5900 Constipation, unspecified: Secondary | ICD-10-CM | POA: Diagnosis not present

## 2018-05-27 DIAGNOSIS — H547 Unspecified visual loss: Secondary | ICD-10-CM | POA: Diagnosis present

## 2018-05-27 DIAGNOSIS — J45909 Unspecified asthma, uncomplicated: Secondary | ICD-10-CM | POA: Diagnosis present

## 2018-05-27 DIAGNOSIS — F4320 Adjustment disorder, unspecified: Secondary | ICD-10-CM | POA: Diagnosis not present

## 2018-05-27 DIAGNOSIS — I82451 Acute embolism and thrombosis of right peroneal vein: Secondary | ICD-10-CM | POA: Diagnosis present

## 2018-05-27 DIAGNOSIS — Y95 Nosocomial condition: Secondary | ICD-10-CM | POA: Diagnosis present

## 2018-05-27 DIAGNOSIS — M25561 Pain in right knee: Secondary | ICD-10-CM

## 2018-05-27 DIAGNOSIS — I82441 Acute embolism and thrombosis of right tibial vein: Secondary | ICD-10-CM | POA: Diagnosis present

## 2018-05-27 DIAGNOSIS — I69018 Other symptoms and signs involving cognitive functions following nontraumatic subarachnoid hemorrhage: Secondary | ICD-10-CM

## 2018-05-27 DIAGNOSIS — Z86718 Personal history of other venous thrombosis and embolism: Secondary | ICD-10-CM | POA: Diagnosis not present

## 2018-05-27 DIAGNOSIS — E739 Lactose intolerance, unspecified: Secondary | ICD-10-CM | POA: Diagnosis present

## 2018-05-27 DIAGNOSIS — I69051 Hemiplegia and hemiparesis following nontraumatic subarachnoid hemorrhage affecting right dominant side: Secondary | ICD-10-CM | POA: Diagnosis not present

## 2018-05-27 DIAGNOSIS — I69319 Unspecified symptoms and signs involving cognitive functions following cerebral infarction: Secondary | ICD-10-CM | POA: Diagnosis not present

## 2018-05-27 DIAGNOSIS — I609 Nontraumatic subarachnoid hemorrhage, unspecified: Secondary | ICD-10-CM | POA: Diagnosis not present

## 2018-05-27 DIAGNOSIS — M7121 Synovial cyst of popliteal space [Baker], right knee: Secondary | ICD-10-CM | POA: Diagnosis present

## 2018-05-27 DIAGNOSIS — J189 Pneumonia, unspecified organism: Secondary | ICD-10-CM | POA: Diagnosis present

## 2018-05-27 DIAGNOSIS — D72819 Decreased white blood cell count, unspecified: Secondary | ICD-10-CM | POA: Diagnosis not present

## 2018-05-27 DIAGNOSIS — S83271D Complex tear of lateral meniscus, current injury, right knee, subsequent encounter: Secondary | ICD-10-CM | POA: Diagnosis not present

## 2018-05-27 DIAGNOSIS — R338 Other retention of urine: Secondary | ICD-10-CM | POA: Diagnosis not present

## 2018-05-27 DIAGNOSIS — I82401 Acute embolism and thrombosis of unspecified deep veins of right lower extremity: Secondary | ICD-10-CM | POA: Diagnosis not present

## 2018-05-27 DIAGNOSIS — S83271S Complex tear of lateral meniscus, current injury, right knee, sequela: Secondary | ICD-10-CM | POA: Diagnosis not present

## 2018-05-27 DIAGNOSIS — Z8249 Family history of ischemic heart disease and other diseases of the circulatory system: Secondary | ICD-10-CM

## 2018-05-27 DIAGNOSIS — I10 Essential (primary) hypertension: Secondary | ICD-10-CM

## 2018-05-27 DIAGNOSIS — D62 Acute posthemorrhagic anemia: Secondary | ICD-10-CM | POA: Diagnosis present

## 2018-05-27 DIAGNOSIS — E876 Hypokalemia: Secondary | ICD-10-CM

## 2018-05-27 DIAGNOSIS — Z91013 Allergy to seafood: Secondary | ICD-10-CM

## 2018-05-27 DIAGNOSIS — R609 Edema, unspecified: Secondary | ICD-10-CM | POA: Diagnosis not present

## 2018-05-27 DIAGNOSIS — S83271A Complex tear of lateral meniscus, current injury, right knee, initial encounter: Secondary | ICD-10-CM

## 2018-05-27 DIAGNOSIS — I82409 Acute embolism and thrombosis of unspecified deep veins of unspecified lower extremity: Secondary | ICD-10-CM | POA: Diagnosis not present

## 2018-05-27 DIAGNOSIS — R4701 Aphasia: Secondary | ICD-10-CM

## 2018-05-27 DIAGNOSIS — I639 Cerebral infarction, unspecified: Secondary | ICD-10-CM | POA: Diagnosis not present

## 2018-05-27 DIAGNOSIS — R739 Hyperglycemia, unspecified: Secondary | ICD-10-CM

## 2018-05-27 MED ORDER — TRAZODONE HCL 50 MG PO TABS
25.0000 mg | ORAL_TABLET | Freq: Every evening | ORAL | Status: DC | PRN
  Administered 2018-05-30 – 2018-06-16 (×7): 50 mg via ORAL
  Filled 2018-05-27 (×8): qty 1

## 2018-05-27 MED ORDER — AMLODIPINE BESYLATE 10 MG PO TABS
10.0000 mg | ORAL_TABLET | Freq: Every day | ORAL | Status: DC
  Administered 2018-05-28 – 2018-06-04 (×8): 10 mg via ORAL
  Filled 2018-05-27 (×8): qty 1

## 2018-05-27 MED ORDER — PROCHLORPERAZINE EDISYLATE 10 MG/2ML IJ SOLN: 5.0000 mg | Freq: Four times a day (QID) | INTRAMUSCULAR | Status: DC | PRN

## 2018-05-27 MED ORDER — NIMODIPINE 60 MG/20ML PO SOLN
60.0000 mg | ORAL | Status: DC
  Administered 2018-05-27 – 2018-05-28 (×2): 60 mg via ORAL
  Filled 2018-05-27 (×4): qty 20

## 2018-05-27 MED ORDER — PROCHLORPERAZINE 25 MG RE SUPP: 12.5000 mg | Freq: Four times a day (QID) | RECTAL | Status: DC | PRN

## 2018-05-27 MED ORDER — ACETAMINOPHEN 325 MG PO TABS
325.0000 mg | ORAL_TABLET | ORAL | Status: DC | PRN
  Administered 2018-05-31 – 2018-06-01 (×2): 650 mg via ORAL
  Administered 2018-06-05: 325 mg via ORAL
  Administered 2018-06-06 – 2018-06-19 (×4): 650 mg via ORAL
  Filled 2018-05-27 (×4): qty 2
  Filled 2018-05-27: qty 1
  Filled 2018-05-27 (×3): qty 2

## 2018-05-27 MED ORDER — ALUM & MAG HYDROXIDE-SIMETH 200-200-20 MG/5ML PO SUSP: 30.0000 mL | ORAL | Status: DC | PRN

## 2018-05-27 MED ORDER — GUAIFENESIN-DM 100-10 MG/5ML PO SYRP: 5.0000 mL | ORAL_SOLUTION | Freq: Four times a day (QID) | ORAL | Status: DC | PRN

## 2018-05-27 MED ORDER — DIPHENHYDRAMINE HCL 12.5 MG/5ML PO ELIX: 12.5000 mg | ORAL_SOLUTION | Freq: Four times a day (QID) | ORAL | Status: DC | PRN

## 2018-05-27 MED ORDER — MONTELUKAST SODIUM 10 MG PO TABS
10.0000 mg | ORAL_TABLET | Freq: Every day | ORAL | Status: DC
  Administered 2018-05-27 – 2018-06-23 (×28): 10 mg via ORAL
  Filled 2018-05-27 (×29): qty 1

## 2018-05-27 MED ORDER — PROCHLORPERAZINE MALEATE 5 MG PO TABS: 5.0000 mg | ORAL_TABLET | Freq: Four times a day (QID) | ORAL | Status: DC | PRN

## 2018-05-27 MED ORDER — POLYETHYLENE GLYCOL 3350 17 G PO PACK
17.0000 g | PACK | Freq: Every day | ORAL | Status: DC | PRN
  Administered 2018-06-05 – 2018-06-11 (×3): 17 g via ORAL
  Filled 2018-05-27 (×3): qty 1

## 2018-05-27 MED ORDER — ENSURE ENLIVE PO LIQD
237.0000 mL | Freq: Two times a day (BID) | ORAL | Status: DC
  Administered 2018-05-28 – 2018-06-23 (×38): 237 mL via ORAL

## 2018-05-27 MED ORDER — BISACODYL 10 MG RE SUPP
10.0000 mg | Freq: Every day | RECTAL | Status: DC | PRN
  Administered 2018-06-12 – 2018-06-22 (×2): 10 mg via RECTAL
  Filled 2018-05-27 (×2): qty 1

## 2018-05-27 MED ORDER — NIMODIPINE 30 MG PO CAPS: 60.0000 mg | ORAL_CAPSULE | ORAL | 0 refills | Status: DC

## 2018-05-27 MED ORDER — NIMODIPINE 30 MG PO CAPS
60.0000 mg | ORAL_CAPSULE | ORAL | Status: DC
  Administered 2018-05-27: 60 mg via ORAL
  Filled 2018-05-27 (×2): qty 2

## 2018-05-27 MED ORDER — NIMODIPINE 30 MG PO CAPS
60.0000 mg | ORAL_CAPSULE | ORAL | Status: DC
  Filled 2018-05-27 (×3): qty 2

## 2018-05-27 MED ORDER — LORATADINE 10 MG PO TABS
10.0000 mg | ORAL_TABLET | Freq: Every day | ORAL | Status: DC
  Administered 2018-05-28 – 2018-06-24 (×28): 10 mg via ORAL
  Filled 2018-05-27 (×28): qty 1

## 2018-05-27 MED ORDER — ADULT MULTIVITAMIN W/MINERALS CH
1.0000 | ORAL_TABLET | Freq: Every day | ORAL | Status: DC
  Administered 2018-05-28 – 2018-06-24 (×28): 1 via ORAL
  Filled 2018-05-27 (×29): qty 1

## 2018-05-27 MED ORDER — FAMOTIDINE 20 MG PO TABS
20.0000 mg | ORAL_TABLET | Freq: Two times a day (BID) | ORAL | Status: DC
  Administered 2018-05-27 – 2018-06-24 (×56): 20 mg via ORAL
  Filled 2018-05-27 (×57): qty 1

## 2018-05-27 MED ORDER — FLEET ENEMA 7-19 GM/118ML RE ENEM: 1.0000 | ENEMA | Freq: Once | RECTAL | Status: DC | PRN

## 2018-05-27 MED ORDER — NIMODIPINE 60 MG/20ML PO SOLN
60.0000 mg | ORAL | Status: DC
  Filled 2018-05-27 (×3): qty 20

## 2018-05-27 NOTE — Progress Notes (Signed)
  Speech Language Pathology Treatment: Cognitive-Linquistic  Patient Details Name: [name] [name] MRN: [id number] DOB: 05/24/1986 Today's Date: 05/27/2018 Time: 7989-2119 SLP Time Calculation (min) (ACUTE ONLY): 25 min  Assessment / Plan / Recommendation Clinical Impression  Treatment session focused on cueing and opportunities to initiate verbalization. Pt still relying on head nods for moat interaction, does not initiate speech. She is easily distracted by blankets, hair, nails even when environmental distractors are eliminated. Needed max verbal and contextual cues to respond verbally initially to Y?N biographical questions. Transitioned to complex y/N questions with long pause and visual cues to elicit verbal response. Pts responses were also 100% accurate. She demonstrates accurate short term memory as well. We were able to extend into simple conversation with a few phrase length responses. Explained short term goal of eliciting initiation of expression of wants and needs. Pt denied any needs at present other than having her daughters visit. She reports the family told her on Friday that the girls could come, but did not given an exact date, so SLP set a goal for pt to directly ask her family when they might come as this seems to be pts only motivator at this time.   HPI HPI: 32 year old female admitted 11/21 with SAH and taken to OR for clipping 11/22, but remained on the ventilator in the post-operative setting. Intubated 11/21-11/27. 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 CXR shows Left basilar atelectasis with associated effusion.      SLP Plan  Continue with current plan of care       Recommendations  Liquids provided via: Cup;Straw                Plan: Continue with current plan of care       GO               Herbie [place], MA [place] Pager [phone] Office  [phone]  Lynann Beaver 05/27/2018, 10:53 AM

## 2018-05-27 NOTE — Progress Notes (Signed)
Physical Therapy Treatment Patient Details Name: [name] [name] MRN: [id number] DOB: 1985/06/26 Today's Date: 05/27/2018    History of Present Illness Pt is a 32 y/o female  with history of HTN admitted on 05/09/18 for evaluation of headache.  Found to have a Alba and taken to OR on 05/10/18 for left pterional craniotomy for clipping of anterior communicating artery aneurysm, but remained on the ventilator 11/21-11/27. 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     PT Comments    Pt pleasant and smiling and laughing during session. She did have limited verbalization stating her daughters names and ages less than 2 and 4. Discussing painting nails she said "I can" otherwise responding with head nods.  Pt with decreased assist to stand today with assist for weight shift and moving legs to step with pivot. Continue to recommend CIR.    Follow Up Recommendations  CIR     Equipment Recommendations  Wheelchair (measurements PT);Wheelchair cushion (measurements PT);3in1 (PT);Other (comment)    Recommendations for Other Services       Precautions / Restrictions Precautions Precautions: Fall Precaution Comments: right hemi, pushes right Restrictions Weight Bearing Restrictions: No    Mobility  Bed Mobility Overal bed mobility: Needs Assistance Bed Mobility: Rolling;Sidelying to Sit Rolling: Min assist Sidelying to sit: Mod assist       General bed mobility comments: min assist to roll to right with cues and assist to move RLE, rise from right with mod assist to bring legs off of bed and use of LUE to assist with trunk elevation. Pt with right lean in sitting able to correct with left elbow propping or grabbing footboard to left  Transfers Overall transfer level: Needs assistance   Transfers: Sit to/from Stand;Stand Pivot Transfers Sit to Stand: Mod assist;+2 physical assistance Stand pivot transfers: Max assist;+2 physical assistance        General transfer comment: mod assist +2 to stand from bed and chair with cues and heavy use of left side to power up. In standing right knee blocked, bil UE support and cues for lateral shift to left. Pt able to pivot to right with assist to advance RLE and assist to slide LLE. Standing 2nd trials grossly 4 min for facilitation of trunk extension and weight shift left, no activation of right quad in standing  Ambulation/Gait             General Gait Details: unable   Stairs             Wheelchair Mobility    Modified Rankin (Stroke Patients Only) Modified Rankin (Stroke Patients Only) Pre-Morbid Rankin Score: No symptoms Modified Rankin: Severe disability     Balance Overall balance assessment: Needs assistance Sitting-balance support: Single extremity supported;Feet supported Sitting balance-Leahy Scale: Poor Sitting balance - Comments: min assist with LUE support on rail, max assist with no UE support Postural control: Right lateral lean Standing balance support: Bilateral upper extremity supported;During functional activity Standing balance-Leahy Scale: Zero Standing balance comment: 2 person max assist in standing for balance with right lean                            Cognition Arousal/Alertness: Awake/alert Behavior During Therapy: Flat affect Overall Cognitive Status: Difficult to assess Area of Impairment: Following commands                   Current Attention Level: Selective   Following Commands: Follows one  step commands inconsistently;Follows one step commands with increased time     Problem Solving: Slow processing;Decreased initiation;Difficulty sequencing;Requires verbal cues;Requires tactile cues        Exercises General Exercises - Lower Extremity Long Arc Quad: AROM;15 reps;Seated;Left Hip Flexion/Marching: AROM;15 reps;Seated;Left    General Comments        Pertinent Vitals/Pain Pain Assessment: No/denies pain     Home Living                      Prior Function            PT Goals (current goals can now be found in the care plan section) Progress towards PT goals: Progressing toward goals    Frequency    Min 4X/week      PT Plan Current plan remains appropriate    Co-evaluation              AM-PAC PT "6 Clicks" Mobility   Outcome Measure  Help needed turning from your back to your side while in a flat bed without using bedrails?: A Lot Help needed moving from lying on your back to sitting on the side of a flat bed without using bedrails?: A Lot Help needed moving to and from a bed to a chair (including a wheelchair)?: Total Help needed standing up from a chair using your arms (e.g., wheelchair or bedside chair)?: Total Help needed to walk in hospital room?: Total Help needed climbing 3-5 steps with a railing? : Total 6 Click Score: 8    End of Session Equipment Utilized During Treatment: Gait belt Activity Tolerance: Patient tolerated treatment well Patient left: in chair;with call bell/phone within reach;with chair alarm set Nurse Communication: Mobility status;Need for lift equipment PT Visit Diagnosis: Other abnormalities of gait and mobility (R26.89);Hemiplegia and hemiparesis Hemiplegia - Right/Left: Right Hemiplegia - dominant/non-dominant: Dominant Hemiplegia - caused by: Other Nontraumatic intracranial hemorrhage     Time: 0821-0844 PT Time Calculation (min) (ACUTE ONLY): 23 min  Charges:  $Therapeutic Activity: 8-22 mins $Neuromuscular Re-education: 8-22 mins                     [place], PT Acute Rehabilitation Services Pager: [phone] Office: [phone]    Gloriana Piltz B Jakobi [place] 05/27/2018, 10:49 AM

## 2018-05-27 NOTE — Progress Notes (Signed)
  NEUROSURGERY PROGRESS NOTE   No issues overnight. Remains nonverbal  EXAM:  BP 113/73   Pulse 74   Temp 98.5 F (36.9 C) (Oral)   Resp 15   Ht 5\' 10"  (1.778 m)   Wt 92.9 kg   LMP 05/03/2018   SpO2 99%   BMI 29.39 kg/m   Awake Nonvebal Follows commands Moves LUE well. Wiggles right thumb and toes  IMPRESSION/PLAN 32 y.o. female [place] d# 18 s/p clipping Acom aneurysm with postop ACA stroke. Neurologically stable without clinical signs of spasm.  - Dispo planning - continue supportive care

## 2018-05-27 NOTE — IPOC Note (Signed)
Overall Plan of Care [facility]) Patient Details Name: [name] [name] MRN: [id number] DOB: [birth year]  Admitting Diagnosis: Left ACA infarct.  Hospital Problems: Active Problems:   SAH (subarachnoid hemorrhage) (HCC)   Nonverbal   Functional Problem List: Nursing Bladder, Bowel, Endurance, Medication Management, Nutrition, Safety, Perception  PT Balance, Sensory, Skin Integrity, Endurance, Motor, Perception, Safety  OT Balance, Pain, Behavior, Perception, Cognition, Safety, Sensory, Endurance, Skin Integrity, Motor, Vision  SLP Cognition, Linguistic  TR         Basic ADL's: OT Eating, Grooming, Bathing, Dressing, Toileting     Advanced  ADL's: OT       Transfers: PT Bed Mobility, Bed to Chair, Car  OT Toilet     Locomotion: PT Ambulation, Wheelchair Mobility     Additional Impairments: OT Fuctional Use of Upper Extremity  SLP Communication, Social Cognition expression Attention, Problem Solving, Memory, Awareness  TR      Anticipated Outcomes Item Anticipated Outcome  Self Feeding Set-up/supervision  Swallowing      Basic self-care  Min A  Toileting  Min A   Bathroom Transfers Min A  Bowel/Bladder  achieve continence of B and B, maintain regular pattern of emptying  Transfers  minA  Locomotion  minA  Communication     Cognition  min assist   Pain  no pain or less than 3  Safety/Judgment  Remain free of falls, infection and skin breakdown   Therapy Plan: PT Intensity: Minimum of 1-2 x/day ,45 to 90 minutes PT Frequency: 5 out of 7 days PT Duration Estimated Length of Stay: 21-28 days OT Intensity: Minimum of 1-2 x/day, 45 to 90 minutes OT Frequency: 5 out of 7 days OT Duration/Estimated Length of Stay: 21-28 days SLP Intensity: Minumum of 1-2 x/day, 30 to 90 minutes SLP Frequency: 3 to 5 out of 7 days SLP Duration/Estimated Length of Stay: 21-28 days     Team Interventions: Nursing Interventions Patient/Family Education, Bladder Management,  Bowel Management, Medication Management, Cognitive Remediation/Compensation, Discharge Planning, Psychosocial Support  PT interventions Ambulation/gait training, Discharge planning, Functional mobility training, Psychosocial support, Therapeutic Activities, Visual/perceptual remediation/compensation, Wheelchair propulsion/positioning, Therapeutic Exercise, Skin care/wound management, Neuromuscular re-education, Disease management/prevention, Training and development officer, Cognitive remediation/compensation, DME/adaptive equipment instruction, Pain management, Splinting/orthotics, UE/LE Strength taining/ROM, UE/LE Coordination activities, Stair training, Patient/family education, Functional electrical stimulation, Community reintegration  OT Interventions Training and development officer, Functional electrical stimulation, Pain management, Self Care/advanced ADL retraining, Therapeutic Activities, UE/LE Coordination activities, Therapeutic Exercise, Skin care/wound managment, Patient/family education, Functional mobility training, Cognitive remediation/compensation, Disease mangement/prevention, DME/adaptive equipment instruction, Neuromuscular re-education, Psychosocial support, Splinting/orthotics, UE/LE Strength taining/ROM, Wheelchair propulsion/positioning  SLP Interventions Cognitive remediation/compensation, English as a second language teacher, Environmental controls, Functional tasks, Internal/external aids, Patient/family education  TR Interventions    SW/CM Interventions Discharge Planning, Psychosocial Support, Patient/Family Education   Barriers to Discharge MD  Medical stability, Lack of/limited family support, Weight and Nonverbal  Nursing Decreased caregiver support    PT Decreased caregiver support    OT Decreased caregiver support, Home environment access/layout, Lack of/limited family support, Incontinence Plans to d/c to SNF  SLP      SW Decreased caregiver support Does not have 24 hr care plan to go to a  NH from here   Team Discharge Planning: Destination: PT-Skilled [place] (SNF) ,OT- [place] (SNF) , SLP-Skilled Nursing Facility (SNF) Projected Follow-up: PT-Skilled nursing facility, OT-  Skilled nursing facility, SLP-Skilled Nursing facility Projected Equipment Needs: PT- , OT- To be determined, SLP-None recommended by SLP Equipment Details: PT- , OT-  Patient/family involved  in discharge planning: PT- Patient,  OT-Patient, SLP-Patient unable/family or caregive not available  MD ELOS: 24-28 days. Medical Rehab Prognosis:  Good Assessment: 32 year old female with history of HTN, eczema, chronic allergic rhinitis, visual deficits; who was admitted via ED on 05/09/2018 with bilateral neck pain, vomiting and tightness radiating down her back to her buttocks.  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 Cerebral angiogram with attempts at coiling not ameable therefore patient underwent Left pterional crani with aneurysm clipping by Dr. Kathyrn Sheriff. Post op, patient developed right sided weakness with somnolence as well as elevated BP requiring Cleviprex for BP control and Nimotop to manage vasospasm. CT head showed resolution of hydrocephalus and repeat CT 11/25 reviewed, showing left ACA infarct.  Per report, left ACA territory hypoattenuation consistent with early subacute ischemia. 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 She tolerated extubation without difficulty and is tolerating regular textures. She continues on nimotop thorough 12/24. Verbal output and activity tolerance deficits along with right hemiplegia. Will set goals for Min A with PT/OT/SLP.  See Team Conference Notes for weekly updates to the plan of care

## 2018-05-27 NOTE — Progress Notes (Signed)
Patient arrived to unit with NT and RN. Verbalized understanding of safety plan. Oriented to unit procedures. Resting comfortably with call bell in place.

## 2018-05-27 NOTE — Progress Notes (Signed)
Jamse Arn, MD  Physician  Physical Medicine and Rehabilitation  Consult Note  Signed  Date of Service:  05/21/2018 8:58 AM       Related encounter: ED to Hosp-Admission (Current) from 05/09/2018 in [place] NEURO/TRAUMA/SURGICAL ICU      Signed      Expand All Collapse All    Show:Clear all [x] Manual[x] Template[] Copied  Added by: [x] Love, Ivan Anchors, PA-C[x] Jamse Arn, MD  [] Hover for details      Physical Medicine and Rehabilitation Consult   Reason for Consult:  Functional decline due to aneurysm rupture.  Referring Physician: Dr. Kathyrn Sheriff.    HPI: [name] [name] is a 32 y.o. female with history of HTN, eczema, chronic nasal sinus congestion who was admitted via ED on 05/09/2018 with reports of headaches with bilateral neck pain and tightness radiating down to entire back to buttocks as well as vomiting x3 days.  CT head showing SAH.  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  She underwent cerebral angiography confirming presence of multilobulated A comm aneurysm which was not amenable to coil embolization therefore underwent Left pterional crani with aneurysm clipping by Dr. Kathyrn Sheriff. Post op developed right sided weakness with somnolence as well as elevated BP requiring cleviprex for BP control and nimotop added to manage vasospasm.  Follow up CT 11/23 head showed resolution of mild hydrocephalus and repeat CT head 11/25 reviewed, showing left ACA infarct. Per report, new area of hypoattenuation in L-ACA territory consistent with early subacute ischemia.   Fevers due to HCAP treated with IV antibiotics and she tolerated extubation by 11/27. She has been non-verbal and kept NPO yesterday. MBS done revealing normal swallow function and patient started on regular textures, thins. Therapy ongoing and patient limited by left hemiplegia, baseline visual deficits, aphonia with  evidence of apraxia. CIR recommended due to functional decline in mobility and self care tasks.     Review of Systems  Unable to perform ROS: Patient nonverbal        Past Medical History:  Diagnosis Date  . Asthma, allergic    uses inhaler prn - infrequently  . Bilateral thoracic back pain 09/13/2015  . Eczema   . Hypertension   . Lactose intolerance   . Mature cystic teratoma of ovary   . Mood swings 06/01/2015  . Seasonal allergies          Past Surgical History:  Procedure Laterality Date  . CRANIOTOMY N/A 05/10/2018   Procedure: 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;  Surgeon: Consuella Lose, MD;  Location: [facility];  Service: Neurosurgery;  Laterality: N/A;  . RADIOLOGY WITH ANESTHESIA N/A 05/10/2018   Procedure: EMBOLIZATION OF ANEURSYM;  Surgeon: Consuella Lose, MD;  Location: [facility];  Service: Radiology;  Laterality: N/A;  . TUBAL LIGATION N/A 12/15/2016   Procedure: POST PARTUM TUBAL LIGATION;  Surgeon: Mora Bellman, MD;  Location: [facility];  Service: Gynecology;  Laterality: N/A;  . WISDOM TOOTH EXTRACTION           Family History  Adopted: Yes  Problem Relation Age of Onset  . Hypertension Father   . Hypertension Maternal Aunt   . Hypertension Maternal Uncle   . Hypertension Maternal Grandmother   . Eczema Maternal Grandmother   . Hypertension Maternal Grandfather   . Hypertension Paternal Grandmother   . Hypertension Mother   . Eczema Mother     Social History:  Single. Lives  with two young children. Indicated that she does not drive due to visual deficits? She reports that she has been smoking cigarettes. She has been smoking about 0.25 packs per day. She has quit using smokeless tobacco. Per reports she does not drink alcohol or use drugs.         Allergies  Allergen Reactions  . Sulfa Antibiotics Swelling    Causes swelling on the face.  . Shellfish Allergy Other (See  Comments)    Stomach upset          Medications Prior to Admission  Medication Sig Dispense Refill  . amLODipine (NORVASC) 10 MG tablet Take 1 tablet (10 mg total) by mouth daily. To lower blood pressure 30 tablet 6  . cetirizine (ZYRTEC) 10 MG tablet TAKE 1 TABLET BY MOUTH ONCE DAILY (Patient taking differently: Take 10 mg by mouth daily. ) 30 tablet 2  . ibuprofen (ADVIL,MOTRIN) 600 MG tablet Take 1 tablet (600 mg total) by mouth every 6 (six) hours. X 1week then prn pain 90 tablet 0  . montelukast (SINGULAIR) 10 MG tablet TAKE 1 TABLET BY MOUTH ONCE DAILY AT BEDTIME (Patient taking differently: Take 10 mg by mouth at bedtime. ) 30 tablet 2  . omeprazole (PRILOSEC) 20 MG capsule TAKE 1 CAPSULE BY MOUTH ONCE DAILY (Patient taking differently: Take 20 mg by mouth daily. ) 30 capsule 2  . clobetasol ointment (TEMOVATE) 8.33 % Apply 1 application topically 2 (two) times daily. (Patient not taking: Reported on 05/09/2018) 60 g 6  . triamcinolone cream (KENALOG) 0.1 % Apply 1 application topically 2 (two) times daily as needed. To affected areas for eczema. (Patient not taking: Reported on 05/09/2018) 80 g 1    Home: Home Living Family/patient expects to be discharged to:: Private residence Living Arrangements: Alone, Children Available Help at Discharge: Family, Friend(s)(to be determined) Type of Home: Apartment Home Access: Stairs to enter [employer] of Steps: 3 Entrance Stairs-Rails: None Home Layout: Two level Alternate Level Stairs-Number of Steps: flight Alternate Level Stairs-Rails: Left Bathroom Shower/Tub: Tub/shower unit, Architectural technologist: Standard Additional Comments: information provided by pts aunt   Functional History: Prior Function Level of Independence: Independent Comments: - drive, 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  Functional Status:  Mobility: Bed Mobility Overal bed mobility: Needs Assistance Bed Mobility: Rolling, Sidelying to Sit Rolling: Max assist,  +2 for physical assistance Sidelying to sit: Max assist, +2 for physical assistance General bed mobility comments: pt rolling towards R side, cueing for sequencing and hand placement of L UE on bed rail, max asisst +2 to ascend into sitting  Transfers Overall transfer level: Needs assistance Equipment used: None Transfers: Sit to/from Stand, [employer] Transfers Sit to Stand: Total assist, +2 physical assistance Stand pivot transfers: Total assist, +2 physical assistance Squat pivot transfers: Total assist, +2 physical assistance, +2 safety/equipment General transfer comment: patient able to assist with L UE, but overall requires total assist +2  Ambulation/Gait General Gait Details: Not able  ADL: ADL Overall ADL's : Needs assistance/impaired Eating/Feeding: NPO Grooming: Wash/dry hands, Wash/dry face, Moderate assistance Grooming Details (indicate cue type and reason): demonstrates ability to wash face with supervision (cueing to complete all parts), and wash hands with mod assist for L hand Upper Body Bathing: Maximal assistance, Sitting Lower Body Bathing: Total assistance, +2 for physical assistance, Sitting/lateral leans Upper Body Dressing : Total assistance, Sitting Lower Body Dressing: Total assistance, +2 for physical assistance, Sit to/from stand Toilet Transfer: Total assistance, +2 for physical assistance, Stand-pivot Toilet  Transfer Details (indicate cue type and reason): simulated to recliner  Toileting- Clothing Manipulation and Hygiene: Total assistance, +2 for physical assistance, Sit to/from stand Functional mobility during ADLs: Total assistance, Maximal assistance, +2 for physical assistance General ADL Comments: patient limited by R hemiparesis, impaired cognition and communication, and impaired balance   Cognition: Cognition Overall Cognitive Status: Impaired/Different from baseline Arousal/Alertness: Awake/alert Orientation Level: Other  (comment)(UTA) Attention: Focused, Sustained Focused Attention: Appears intact Sustained Attention: Impaired Sustained Attention Impairment: Verbal complex, Functional complex Problem Solving: Impaired Problem Solving Impairment: Verbal basic, Functional basic Executive Function: Initiating Cognition Arousal/Alertness: Awake/alert Behavior During Therapy: Flat affect Overall Cognitive Status: Impaired/Different from baseline Area of Impairment: Following commands, Problem solving Following Commands: Follows one step commands inconsistently, Follows one step commands with increased time Problem Solving: Slow processing, Decreased initiation, Difficulty sequencing, Requires verbal cues, Requires tactile cues General Comments: pt able to follow 1 step commands inconsistently, able to use "thumbs up" with increased time Difficult to assess due to: Impaired communication  Blood pressure (!) 129/92, pulse 89, temperature 98.6 F (37 C), temperature source Axillary, resp. rate 18, height 5\' 10"  (1.778 m), weight 99 kg, last menstrual period 05/03/2018, SpO2 99 %, currently breastfeeding. Physical Exam  Nursing note and vitals reviewed. Constitutional: She appears well-developed. No distress.  Lying in bed with right bias.  Non verbal  Morbid obesity  HENT:  Head: Normocephalic.  Left crani incision C/D/I with staples in place.  Eyes: EOM are normal. Right eye exhibits no discharge. Left eye exhibits no discharge.  Neck: Normal range of motion. Neck supple.  Cardiovascular: Normal rate and regular rhythm.  Respiratory: Effort normal and breath sounds normal.  GI: Soft. Bowel sounds are normal.  Genitourinary:  Genitourinary Comments: +Rectal tube  Musculoskeletal:  No edema or tenderness in extremities  Neurological: She is alert.  Right lean.  Delayed answers but she was able to nod appropriately to basic Y/N orientation question.  Expressive aphasia Able to point to items in  room.  Motor: RUE/RLE: 0/5 proximal to distal LUE: 5/5 proximal to distal LLE: ?4-/5 proximal to distal ?Sensation intact to light touch  Skin: She is not diaphoretic.  See above  Psychiatric:  Unable to assess due to nonverbal    LabResultsLast24Hours       Results for orders placed or performed during the hospital encounter of 05/09/18 (from the past 24 hour(s))  Glucose, capillary     Status: None   Collection Time: 05/20/18 12:29 PM  Result Value Ref Range   Glucose-Capillary 99 70 - 99 mg/dL  Glucose, capillary     Status: Abnormal   Collection Time: 05/20/18  3:53 PM  Result Value Ref Range   Glucose-Capillary 120 (H) 70 - 99 mg/dL   Comment 1 Notify RN   Glucose, capillary     Status: None   Collection Time: 05/20/18  7:52 PM  Result Value Ref Range   Glucose-Capillary 97 70 - 99 mg/dL  Glucose, capillary     Status: None   Collection Time: 05/20/18 11:48 PM  Result Value Ref Range   Glucose-Capillary 95 70 - 99 mg/dL  Glucose, capillary     Status: Abnormal   Collection Time: 05/21/18  3:53 AM  Result Value Ref Range   Glucose-Capillary 104 (H) 70 - 99 mg/dL  Glucose, capillary     Status: Abnormal   Collection Time: 05/21/18  8:22 AM  Result Value Ref Range   Glucose-Capillary 121 (H) 70 - 99 mg/dL  Comment 1 Notify RN    Comment 2 Document in Chart       ImagingResults(Last48hours)  Vas [name] Transcranial Doppler  Result Date: 05/20/2018  Transcranial Doppler Indications: Subarachnoid hemorrhage. History: A-comm aneurysm. 05/10/18 Craniotomy intracranial aneurysm for ACA aneurysm. Limitations: staples left side Performing Technologist: Velva Harman Sturdivant RDMS, RVT  Examination Guidelines: A complete evaluation includes B-mode imaging, spectral Doppler, color Doppler, and power Doppler as needed of all accessible portions of each vessel. Bilateral testing is considered an integral part of a complete examination. Limited examinations for  reoccurring indications may be performed as noted.  +----------+-------------+----------+-----------+-------------+ RIGHT TCD Right VM (cm)Depth (cm)Pulsatility   Comment    +----------+-------------+----------+-----------+-------------+ MCA           44.00                 0.76                  +----------+-------------+----------+-----------+-------------+ ACA          -37.00                 0.82                  +----------+-------------+----------+-----------+-------------+ PCA                                         not insonated +----------+-------------+----------+-----------+-------------+ Opthalmic     31.00                  1.1                  +----------+-------------+----------+-----------+-------------+ ICA siphon    50.00                 0.76                  +----------+-------------+----------+-----------+-------------+ Vertebral    -34.00                 0.70                  +----------+-------------+----------+-----------+-------------+  +----------+------------+----------+-----------+-------------+ LEFT TCD  Left VM (cm)Depth (cm)Pulsatility   Comment    +----------+------------+----------+-----------+-------------+ MCA          47.00                 0.48                  +----------+------------+----------+-----------+-------------+ ACA          -55.00                0.67                  +----------+------------+----------+-----------+-------------+ Term ICA     25.00                  1.0                  +----------+------------+----------+-----------+-------------+ PCA                                        not insonated +----------+------------+----------+-----------+-------------+ Opthalmic    31.00                  1.2                  +----------+------------+----------+-----------+-------------+  ICA siphon   52.00                 0.78                   +----------+------------+----------+-----------+-------------+ Vertebral    -40.00                0.71                  +----------+------------+----------+-----------+-------------+  +------------+-------+-------+             VM cm/sComment +------------+-------+-------+ Prox Basilar-46.00         +------------+-------+-------+ Dist Basilar-49.00         +------------+-------+-------+ Summary: This was a normal transcranial Doppler study, with normal flow direction and velocity of all identified vessels of the anterior and posterior circulations, with no evidence of stenosis, vasospasm or occlusion. There was no evidence of intracranial disease.  *See table(s) above for measurements and observations.  Diagnosing physician: Antony Contras MD Electronically signed by Antony Contras MD on 05/20/2018 at 12:14:34 PM.    Final      Assessment/Plan: Diagnosis: Left ACA infarct Labs and images (see above) independently reviewed.  Records reviewed and summated above. Stroke: Continue secondary stroke prophylaxis and Risk Factor Modification listed below:   Antiplatelet therapy:   Blood Pressure Management:  Continue current medication with prn's with permisive HTN per primary team Statin Agent:   Tobacco abuse:   Right sided hemiparesis: fit for orthosis to prevent contractures (resting hand splint for day, wrist cock up splint at night, PRAFO, etc) Motor recovery: Fluoxetine  1. Does the need for close, 24 hr/day medical supervision in concert with the patient's rehab needs make it unreasonable for this patient to be served in a less intensive setting? Yes  2. Co-Morbidities requiring supervision/potential complications: HCAP (cont IV abx), HTN (monitor and provide prns in accordance with increased physical exertion and pain), eczema, chronic nasal sinus congestion, ABLA (repeat labs, transfuse to ensure appropriate perfusion for increased activity tolerance) 3. Due to bladder management,  bowel management, safety, skin/wound care, disease management, medication administration, pain management and patient education, does the patient require 24 hr/day rehab nursing? Yes 4. Does the patient require coordinated care of a physician, rehab nurse, PT (1-2 hrs/day, 5 days/week), OT (1-2 hrs/day, 5 days/week) and SLP (1-2 hrs/day, 5 days/week) to address physical and functional deficits in the context of the above medical diagnosis(es)? Yes Addressing deficits in the following areas: balance, endurance, locomotion, strength, transferring, bowel/bladder control, bathing, dressing, feeding, grooming, toileting, cognition, speech, language, swallowing and psychosocial support 5. Can the patient actively participate in an intensive therapy program of at least 3 hrs of therapy per day at least 5 days per week? Potentially 6. The potential for patient to make measurable gains while on inpatient rehab is excellent 7. Anticipated functional outcomes upon discharge from inpatient rehab are min assist and mod assist  with PT, min assist and mod assist with OT, min assist with SLP. 8. Estimated rehab length of stay to reach the above functional goals is: 25-30 days. 9. Anticipated D/C setting: Other 10. Anticipated post D/C treatments: HH therapy and Home excercise program 11. Overall Rehab/Functional Prognosis: good  RECOMMENDATIONS: This patient's condition is appropriate for continued rehabilitative care in the following setting: CIR to decrease burden of care. Patient has agreed to participate in recommended program. Potentially Note that insurance prior authorization may be required for reimbursement for recommended care.  Comment: Rehab Admissions Coordinator  to follow up.   I have personally performed a face to face diagnostic evaluation, including, but not limited to relevant history and physical exam findings, of this patient and developed relevant assessment and plan.  Additionally, I  have reviewed and concur with the physician assistant's documentation above.   Delice Lesch, MD, ABPMR Bary Leriche, PA-C 05/21/2018        Revision History                        Routing History

## 2018-05-27 NOTE — Discharge Summary (Signed)
Physician Discharge Summary  Patient ID: Ermel Verne MRN: [id number] DOB/AGE: Oct 17, 1985 32 y.o.  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 female Who presented to the emergency room on 05/09/2018 due to severe headache.  She was found to have a spontaneous subarachnoid hemorrhage secondary to ruptured anterior communicating artery aneurysm. On 11/22, she underwent diagnostic cerebral angiogram which revealed a multi lobulated A-comm aneurysm that was not amenable to coil embolization so she was subsequently taken for left coronal craniotomy for clipping of ACA aneurysm.    Postop was complicated by prolonged intubation as well as new ACA infarct  Which resulted in right hemiplegia. On 11/26 she was noted to be febrile  And was started empirically on vanc and Zosyn.   Found to have HCAP secondary to Moraxella catarrhalis. Completed abx.  After almost 3 weeks of inpatient stay, she was deemed medically ready for rehab. PT/OT rec CIR. She was discharged in hemodynamically stable condition.  Treatments: Surgery  open craniotomy for clipping of ACA aneurysm  Discharge Exam: Blood pressure (!) 142/76, pulse (!) 107, temperature 98.5 F (36.9 C), temperature source Oral, resp. rate 18, height 5\' 10"  (1.778 m), weight 92.9 kg, last menstrual period 05/03/2018, SpO2 98 %, currently breastfeeding. Awake Nonvebal Follows commands Moves LUE well. Wiggles right thumb and toes  Disposition:  Discharge disposition: [place] Not Defined       Discharge Instructions    Call MD for:  persistant dizziness or light-headedness   Complete by:  As directed    Call MD for:  redness, tenderness, or signs of infection (pain, swelling, redness, odor or green/yellow discharge around incision site)   Complete by:  As directed    Call MD for:  severe uncontrolled pain   Complete by:  As directed    Call MD for:  temperature >100.4   Complete by:  As directed    Increase activity slowly   Complete by:  As directed      Allergies as of 05/27/2018      Reactions   Sulfa Antibiotics Swelling   Causes swelling on the face.   Shellfish Allergy Other (See Comments)   Stomach upset      Medication List    STOP taking these medications   clobetasol ointment 0.05 % Commonly known as:  TEMOVATE   triamcinolone cream 0.1 % Commonly known as:  KENALOG     TAKE these medications   amLODipine 10 MG tablet Commonly known as:  NORVASC Take 1 tablet (10 mg total) by mouth daily. To lower blood pressure   cetirizine 10 MG tablet Commonly known as:  ZYRTEC TAKE 1 TABLET BY MOUTH ONCE DAILY   ibuprofen 600 MG tablet Commonly known as:  ADVIL,MOTRIN Take 1 tablet (600 mg total) by mouth every 6 (six) hours. X 1week then prn pain   montelukast 10 MG tablet Commonly known as:  SINGULAIR TAKE 1 TABLET BY MOUTH ONCE DAILY AT BEDTIME   niMODipine 30 MG capsule Commonly known as:  NIMOTOP  Take 2 capsules (60 mg total) by mouth every 4 (four) hours for 3 days.   omeprazole 20 MG capsule Commonly known as:  PRILOSEC TAKE 1 CAPSULE BY MOUTH ONCE DAILY      Follow-up Information    Consuella Lose, MD. Schedule an appointment as soon as possible for a visit in 3 week(s).   Specialty:  Neurosurgery Contact information: [address] [place] [address] [place] [address] [phone]           Signed: Traci Sermon 05/27/2018, 1:08  PM

## 2018-05-27 NOTE — PMR Pre-admission (Signed)
PMR Admission Coordinator Pre-Admission Assessment  Patient: [name] [name] is an 32 y.o., female MRN: [id number] DOB: [birth year] Height: 5\' 10"  (177.8 cm) Weight: 92.9 kg              Insurance Information HMO:     PPO:      PCP:      IPA:      80/20:      OTHER:  PRIMARY: Medicaid [place] [employer]      Policy#: [id number]      Subscriber: pt Benefits:  Phone #: [phone]     Name: automated system Eff. Date: active   [employer] [place] [employer]  Medicaid Application Date:       Case Manager:  Disability Application Date:       Case Worker:  05/27/2018 I left voicemail for Letitia Libra at [phone] to question if [facility] has coverage for SNF. I await return call.  Emergency Contact Information Contact Information    Name Relation Home Work Mobile   Person,Camille Mother   [place], [place]   [phone]     Current Medical History  Patient Admitting Diagnosis: left ACA infarct  History of Present Illness: [name] [name] is a 31 year old female with history of HTN, eczema, chronic allergic rhinitis, visual deficits; who was admitted via ED on 05/09/2018 with bilateral neck pain, vomiting and tightness radiating down her back to her buttocks. 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 Cerebral angiogram with attempts at coiling not amenable therefore patient underwent Left pterional crani with aneurysm clipping by Dr. Kathyrn Sheriff. Post op, patient developed right sided weakness with somnolence as well as elevated BP requiring Cleviprex for BP control and Nimotop to manage vasospasm. CT head showed resolution of hydrocephalus and repeat CT 11/25 revealed left ACA territory hypo attenuation consistent with early subacute ischemia.   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 She tolerated extubation without difficulty and is tolerating regular textures. She continues on  Nimotop thorough 12/24. Verbal output and activity tolerance improving and CIR recommended due to functional deficits.   Complete NIHSS TOTAL: 9    Past Medical History  Past Medical History:  Diagnosis Date  . Asthma, allergic    uses inhaler prn - infrequently  . Bilateral thoracic back pain 09/13/2015  . Eczema   . Hypertension   . Lactose intolerance   . Mature cystic teratoma of ovary   . Mood swings 06/01/2015  . Seasonal allergies     Family History  family history includes Eczema in her maternal grandmother and mother; Hypertension in her father, maternal aunt, maternal grandfather, maternal grandmother, maternal uncle, mother, and paternal grandmother. She was adopted.  Prior Rehab/Hospitalizations:  Has the patient had major surgery during 100 days prior to admission? No  Current Medications   Current Facility-Administered Medications:  .  0.9 %  sodium chloride infusion, , Intravenous, PRN, Chesley Mires, MD, Stopped at 05/16/18 0919 .  acetaminophen (TYLENOL) tablet 650 mg, 650 mg, Oral, Q4H PRN **OR** [DISCONTINUED] acetaminophen (TYLENOL) solution 650 mg, 650 mg, Per Tube, Q4H PRN, 650 mg at 05/19/18 1320 **OR** acetaminophen (TYLENOL) suppository 650 mg, 650 mg, Rectal, Q4H PRN, Erline Levine, MD .  amLODipine (NORVASC) tablet 10 mg, 10 mg, Oral, Daily, Consuella Lose, MD, 10 mg at 05/27/18 0925 .  famotidine (PEPCID) 40 MG/5ML suspension 20 mg, 20 mg, Oral, QHS, Consuella Lose,  MD, 20 mg at 05/26/18 2108 .  feeding supplement (ENSURE ENLIVE) (ENSURE ENLIVE) liquid 237 mL, 237 mL, Oral, BID BM, Consuella Lose, MD, 237 mL at 05/26/18 1348 .  hydrALAZINE (APRESOLINE) injection 5-20 mg, 5-20 mg, Intravenous, Q4H PRN, Judith Part, MD .  loratadine (CLARITIN) tablet 10 mg, 10 mg, Oral, Daily, Consuella Lose, MD, 10 mg at 05/27/18 0925 .  montelukast (SINGULAIR) tablet 10 mg, 10 mg, Oral, QHS, Consuella Lose, MD, 10 mg at 05/26/18 2108 .   multivitamin with minerals tablet 1 tablet, 1 tablet, Oral, Daily, Rae Mar, RPH, 1 tablet at 05/27/18 0925 .  niMODipine (NIMOTOP) capsule 60 mg, 60 mg, Oral, Q4H, 60 mg at 05/27/18 0218 **OR** niMODipine (NYMALIZE) 60 MG/20ML oral solution 60 mg, 60 mg, Oral, Q4H, Corinda Gubler, RPH, 60 mg at 05/27/18 0925 .  ondansetron (ZOFRAN) injection 4 mg, 4 mg, Intravenous, Q6H PRN, Erline Levine, MD, 4 mg at 05/15/18 2257  Patients Current Diet:  Diet Order            Diet regular Room service appropriate? 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 Disabled due to vision deficits. Mom states she needs cornea transplant.  Home Assistive Devices / Equipment Home Assistive Devices/Equipment: None  Prior Device Use: Indicate devices/aids used by the patient prior to current illness, exacerbation or injury? 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  Independent  Indoor Mobility: Did the patient need assistance with walking from room to room (with or without device)? Independent  Stairs: Did the patient need assistance with internal or external stairs (with or without device)? Independent  Functional Cognition: Did the patient need help planning regular tasks such as shopping or remembering to take medications? Independent  Current Functional Level Cognition  Arousal/Alertness: Awake/alert Overall Cognitive Status:  Difficult to assess Difficult to assess due to: Impaired communication Current Attention Level: Selective Orientation Level: Oriented to person, Oriented to place, Oriented to situation, Disoriented to time Following Commands: Follows one step commands inconsistently, Follows one step commands with increased time Safety/Judgement: Decreased awareness of safety, Decreased awareness of deficits General Comments: pt able to follow 1 step commands given increased time, verbalizes a little with encouragement Attention: Focused, Sustained Focused Attention: Appears intact Sustained Attention: Impaired Sustained Attention Impairment: Verbal complex, Functional complex Problem Solving: Impaired Problem Solving Impairment: Verbal basic, Functional basic Executive Function: Initiating    Extremity Assessment (includes Sensation/Coordination)  Upper Extremity Assessment: RUE deficits/detail RUE Deficits / Details: active movement noted in hand today (gross grasp/extension) with inconsistent performance, otherwise no active movement proxmially RUE Sensation: decreased light touch, decreased proprioception RUE Coordination: decreased fine motor, decreased gross motor  Lower Extremity Assessment: RLE deficits/detail(L side moved voluntarily) RLE Deficits / Details: no spontaneous movement noted RLE Coordination: decreased fine motor, decreased gross motor    ADLs  Overall ADL's : Needs assistance/impaired Eating/Feeding: NPO Grooming: Wash/dry hands, Wash/dry face, Moderate assistance Grooming  Details (indicate cue type and reason): demonstrates ability to wash face with supervision (cueing to complete all parts), and wash hands with mod assist for L hand Upper Body Bathing: Maximal assistance, Sitting Lower Body Bathing: Total assistance, +2 for physical assistance, Sitting/lateral leans Upper Body Dressing : Total assistance, Sitting Lower Body Dressing: Total assistance, +2 for physical  assistance, Sit to/from stand Toilet Transfer: Maximal assistance, +2 for physical assistance, +2 for safety/equipment, Squat-pivot Toilet Transfer Details (indicate cue type and reason): simulated to recliner  Toileting- Clothing Manipulation and Hygiene: Total assistance, +2 for physical assistance, Bed level Toileting - Clothing Manipulation Details (indicate cue type and reason): total assist +3 to clean up after recal tube leaking in bed  Functional mobility during ADLs: Total assistance, Maximal assistance, +2 for physical assistance General ADL Comments: patient limited by R hemiparesis, impaired cognition and communication, and impaired balance     Mobility  Overal bed mobility: Needs Assistance Bed Mobility: Rolling, Sidelying to Sit Rolling: Min assist Sidelying to sit: Mod assist General bed mobility comments: min assist to roll to right with cues and assist to move RLE, rise from right with mod assist to bring legs off of bed and use of LUE to assist with trunk elevation. Pt with right lean in sitting able to correct with left elbow propping or grabbing footboard to left    Transfers  Overall transfer level: Needs assistance Equipment used: None Transfer via Lift Equipment: Stedy Transfers: Sit to/from Stand, Technical brewer Transfers Sit to Stand: Mod assist, +2 physical assistance Stand pivot transfers: Max assist, +2 physical assistance Squat pivot transfers: Max assist, +2 safety/equipment, +2 physical assistance General transfer comment: mod assist +2 to stand from bed and chair with cues and heavy use of left side to power up. In standing right knee blocked, bil UE support and cues for lateral shift to left. Pt able to pivot to right with assist to advance RLE and assist to slide LLE. Standing 2nd trials grossly 4 min for facilitation of trunk extension and weight shift left, no activation of right quad in standing    Ambulation / Gait / Stairs / Wheelchair Mobility   Ambulation/Gait General Gait Details: unable    Posture / Balance Dynamic Sitting Balance Sitting balance - Comments: min assist with LUE support on rail, max assist with no UE support Balance Overall balance assessment: Needs assistance Sitting-balance support: Single extremity supported, Feet supported Sitting balance-Leahy Scale: Poor Sitting balance - Comments: min assist with LUE support on rail, max assist with no UE support Postural control: Right lateral lean Standing balance support: Bilateral upper extremity supported, During functional activity Standing balance-Leahy Scale: Zero Standing balance comment: 2 person max assist in standing for balance with right lean    Special needs/care consideration BiPAP/CPAP n/a CPM n/a Continuous Drip IV n/a Dialysis n/a Life Vest n/a Oxygen n/a Special Bed n/a Trach Size n/a Wound Vac n/a Skin staples to surgical incision Bowel mgmt: diarrhea with rectal tube removed 12/9; RNs states feel diarrhea due to nutritional supplements Bladder mgmt: external catheter Diabetic mgmt n/a Patient with vision loss pta   Previous Home Environment Living Arrangements: (lives alone with ther two yong children ( 32 and 20 year olds))  Lives With: (two young children) Available Help at Discharge: Other (Comment)(plans to d/c to SNF due lack of caregiver support at home) Type of Home: Apartment Home Layout: Two level Alternate Level Stairs-Rails: Left Alternate Level Stairs-Number of Steps: flight Home Access: Stairs to enter Entrance  Stairs-Rails: None Entrance Stairs-Number of Steps: 3 Bathroom Shower/Tub: Tub/shower unit, Industrial/product designer: No Home Care Services: No Additional Comments: information provided by pts aunt   Discharge Living Setting Plans for Discharge Living Setting: Other (Comment)(plans to d/c to SNF when inpt rehab venue complete) Type of Home at Discharge: [place] Name at Discharge: To be determined with assistance from Georga Kaufmann with Care managment department Discharge Home Layout: (SNF rehab) Does the patient have any problems obtaining your medications?: No  Social/Family/Support Systems Patient Roles: Parent Contact Information: MOm, Rosendo Gros Person is her NOK, her [place] Charon is also involved Anticipated Caregiver: SNF rehab Anticipated Caregiver's Contact Information: [phone] Ability/Limitations of Caregiver: Mom and Elenor Legato both work during the week; patient's [employer] is caring for the two kids   Caregiver Availability: (SNF ) Discharge Plan Discussed with Primary Caregiver: Yes Is Caregiver In Agreement with Plan?: Yes Does Caregiver/Family have Issues with Lodging/Transportation while Pt is in Rehab?: No  Patient's grandmother is caring for the two young children as well as Grandmother's spouse who is w/c level at home. Pt's Mom and Aunt assisting on the weekends with the two young children.  Goals/Additional Needs Patient/Family Goal for Rehab: min to mod PT , OT, and SLP  Expected length of stay: [facility] 25 to 30 days and then SNF to decrease burden of care Pt/Family Agrees to Admission and willing to participate: Yes Program Orientation Provided & Reviewed with Pt/Caregiver Including Roles  & Responsibilities: Yes  Barriers to Discharge: Decreased caregiver support, Lack of/limited family support  Barriers to Discharge Comments: Plans to d/c to SNF after inpt rehab stay  Decrease burden of Care through IP rehab admission: Decrease number of caregivers, Bowel and bladder program and Patient/family education  Possible need for SNF placement upon discharge: SNF rehab is the d/c dispo after CIR. Georga Kaufmann with care management to assist CIR SW with obtaining a bed after a 2 to 3 week CIR stay.  Patient Condition: This patient's medical and functional status has changed since the consult dated 05/21/2018 in which  the Rehabilitation Physician determined and documented that the patient was potentially appropriate for intensive rehabilitative care in an inpatient rehabilitation facility. Issues have been addressed and update has been discussed with Dr. Posey Pronto and patient now appropriate for inpatient rehabilitation. Will admit to inpatient rehab today.   Preadmission Screen Completed By:  Cleatrice Burke, 05/27/2018 1:52 PM ______________________________________________________________________   Discussed status with Dr. Posey Pronto on 05/27/2018 at 1400 and received telephone approval for admission today.  Admission Coordinator:  Cleatrice Burke, time [id number] Date 05/27/2018

## 2018-05-27 NOTE — H&P (Signed)
Physical Medicine and Rehabilitation Admission H&P    Chief Complaint  Patient presents with  . Functional decline  . Right sided weakness with expressive aphasia.     HPI: [name] [name] is a 32 year old female with history of HTN, eczema, chronic allergic rhinitis, visual deficits; who was admitted via ED on 05/09/2018 with bilateral neck pain, vomiting and tightness radiating down her back to her buttocks.  History taken from chart review.  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 Cerebral angiogram with attempts at coiling not ameable therefore patient underwent Left pterional crani with aneurysm clipping by Dr. Kathyrn Sheriff. Post op, patient developed right sided weakness with somnolence as well as elevated BP requiring Cleviprex for BP control and Nimotop to manage vasospasm. CT head showed resolution of hydrocephalus and repeat CT 11/25 reviewed, showing left ACA infarct.  Per report, left ACA territory hypoattenuation consistent with early subacute ischemia.   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 She tolerated extubation without difficulty and is tolerating regular textures. She continues on nimotop thorough 12/24. Verbal output and activity tolerance improving and CIR recommended due to functional deficits.   Review of Systems  Unable to perform ROS: Patient nonverbal   Past Medical History:  Diagnosis Date  . Asthma, allergic    uses inhaler prn - infrequently  . Bilateral thoracic back pain 09/13/2015  . Eczema   . Hypertension   . Lactose intolerance   . Mature cystic teratoma of ovary   . Mood swings 06/01/2015  . Seasonal allergies     Past Surgical History:  Procedure Laterality Date  . 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 RADIOLOGY WITH ANESTHESIA N/A 05/10/2018   Procedure: EMBOLIZATION OF ANEURSYM;  Surgeon: Consuella Lose, MD;  Location: [facility];  Service: Radiology;  Laterality: N/A;  . TUBAL LIGATION N/A 12/15/2016   Procedure: POST PARTUM TUBAL LIGATION;  Surgeon: Mora Bellman, MD;  Location: [facility];  Service: Gynecology;  Laterality: N/A;  . WISDOM TOOTH EXTRACTION      Family History  Adopted: Yes  Problem Relation Age of Onset  . Hypertension Father   . Hypertension Maternal Aunt   . Hypertension Maternal Uncle   . Hypertension Maternal Grandmother   . Eczema Maternal Grandmother   . Hypertension Maternal Grandfather   . Hypertension Paternal Grandmother   . Hypertension Mother   . Eczema Mother     Social History:  Single. Disabled due to visual deficits--needs corneal transplant?. Per  reports that she has been smoking cigarettes. She has been smoking about 0.25 packs per day. She has quit using smokeless tobacco. She reports that she does not drink alcohol or use drugs.   Allergies  Allergen Reactions  . Sulfa Antibiotics Swelling    Causes swelling on the face.  . Shellfish Allergy Other (See Comments)    Stomach upset    Medications Prior to Admission  Medication Sig Dispense Refill  . amLODipine (NORVASC) 10 MG tablet Take 1 tablet (10 mg total) by mouth daily. To lower blood pressure 30 tablet 6  . cetirizine (ZYRTEC) 10 MG tablet TAKE 1 TABLET BY MOUTH ONCE DAILY (Patient taking differently: Take 10 mg by mouth daily. ) 30 tablet 2  . ibuprofen (ADVIL,MOTRIN)  600 MG tablet Take 1 tablet (600 mg total) by mouth every 6 (six) hours. X 1week then prn pain 90 tablet 0  . montelukast (SINGULAIR) 10 MG tablet TAKE 1 TABLET BY MOUTH ONCE DAILY AT BEDTIME (Patient taking differently: Take 10 mg by mouth at bedtime. ) 30 tablet 2  . omeprazole (PRILOSEC) 20 MG capsule TAKE 1 CAPSULE BY MOUTH ONCE DAILY (Patient taking differently: Take 20  mg by mouth daily. ) 30 capsule 2  . clobetasol ointment (TEMOVATE) 5.00 % Apply 1 application topically 2 (two) times daily. (Patient not taking: Reported on 05/09/2018) 60 g 6  . triamcinolone cream (KENALOG) 0.1 % Apply 1 application topically 2 (two) times daily as needed. To affected areas for eczema. (Patient not taking: Reported on 05/09/2018) 80 g 1    Drug Regimen Review  Drug regimen was reviewed and remains appropriate with no significant issues identified  Home: Home Living Family/patient expects to be discharged to:: Private residence Living Arrangements: Alone, Children Available Help at Discharge: Family, Friend(s)(to be determined) Type of Home: Apartment Home Access: Stairs to enter [employer] of Steps: 3 Entrance Stairs-Rails: None Home Layout: Two level Alternate Level Stairs-Number of Steps: flight Alternate Level Stairs-Rails: Left Bathroom Shower/Tub: Tub/shower unit, Architectural technologist: Standard Additional Comments: information provided by pts aunt    Functional History: Prior Function Level of Independence: Independent Comments: - drive, 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   Functional Status:  Mobility: Bed Mobility Overal bed mobility: Needs Assistance Bed Mobility: Rolling, Sidelying to Sit Rolling: Min assist Sidelying to sit: Mod assist General bed mobility comments: min assist to roll to right with cues and assist to move RLE, rise from right with mod assist to bring legs off of bed and use of LUE to assist with trunk elevation. Pt with right lean in sitting able to correct with left elbow propping or grabbing footboard to left Transfers Overall transfer level: Needs assistance Equipment used: None Transfer via Lift Equipment: Stedy Transfers: Sit to/from Stand, Technical brewer Transfers Sit to Stand: Mod assist, +2 physical assistance Stand pivot transfers: Max assist, +2 physical assistance Squat pivot transfers: Max assist, +2 safety/equipment, +2  physical assistance General transfer comment: mod assist +2 to stand from bed and chair with cues and heavy use of left side to power up. In standing right knee blocked, bil UE support and cues for lateral shift to left. Pt able to pivot to right with assist to advance RLE and assist to slide LLE. Standing 2nd trials grossly 4 min for facilitation of trunk extension and weight shift left, no activation of right quad in standing Ambulation/Gait General Gait Details: unable    ADL: ADL Overall ADL's : Needs assistance/impaired Eating/Feeding: NPO Grooming: Wash/dry hands, Wash/dry face, Moderate assistance Grooming Details (indicate cue type and reason): demonstrates ability to wash face with supervision (cueing to complete all parts), and wash hands with mod assist for L hand Upper Body Bathing: Maximal assistance, Sitting Lower Body Bathing: Total assistance, +2 for physical assistance, Sitting/lateral leans Upper Body Dressing : Total assistance, Sitting Lower Body Dressing: Total assistance, +2 for physical assistance, Sit to/from stand Toilet Transfer: Maximal assistance, +2 for physical assistance, +2 for safety/equipment, Squat-pivot Toilet Transfer Details (indicate cue type and reason): simulated to recliner  Toileting- Clothing Manipulation and Hygiene: Total assistance, +2 for physical assistance, Bed level Toileting - Clothing Manipulation Details (indicate cue type and reason): total assist +3 to clean up after recal tube leaking in bed  Functional mobility during  ADLs: Total assistance, Maximal assistance, +2 for physical assistance General ADL Comments: patient limited by R hemiparesis, impaired cognition and communication, and impaired balance   Cognition: Cognition Overall Cognitive Status: Difficult to assess Arousal/Alertness: Awake/alert Orientation Level: Oriented to person, Oriented to place, Oriented to situation, Disoriented to time Attention: Focused,  Sustained Focused Attention: Appears intact Sustained Attention: Impaired Sustained Attention Impairment: Verbal complex, Functional complex Problem Solving: Impaired Problem Solving Impairment: Verbal basic, Functional basic Executive Function: Initiating Cognition Arousal/Alertness: Awake/alert Behavior During Therapy: Flat affect Overall Cognitive Status: Difficult to assess Area of Impairment: Following commands Current Attention Level: Selective Memory: Decreased short-term memory Following Commands: Follows one step commands inconsistently, Follows one step commands with increased time Safety/Judgement: Decreased awareness of safety, Decreased awareness of deficits Awareness: Intellectual Problem Solving: Slow processing, Decreased initiation, Difficulty sequencing, Requires verbal cues, Requires tactile cues General Comments: pt able to follow 1 step commands given increased time, verbalizes a little with encouragement Difficult to assess due to: Impaired communication  Physical Exam: Blood pressure 124/89, pulse 97, temperature 98.3 F (36.8 C), temperature source Oral, resp. rate 20, height 5\' 10"  (1.778 m), weight 92.9 kg, last menstrual period 05/03/2018, SpO2 100 %, currently breastfeeding. Physical Exam  Vitals reviewed. Constitutional: She appears well-developed and well-nourished. No distress.  Obese  HENT:  Head: Atraumatic.  Left crani incision  Eyes: Pupils are equal, round, and reactive to light. Conjunctivae and EOM are normal. Right eye exhibits no discharge. Left eye exhibits no discharge.  Neck: Normal range of motion. Neck supple.  Cardiovascular: Normal rate and regular rhythm.  Respiratory: Effort normal and breath sounds normal.  GI: Soft. Bowel sounds are normal. She exhibits no distension. There is no tenderness.  Musculoskeletal: She exhibits no tenderness.  No edema or tenderness in extremities  Neurological: She is alert.  Nonverbal, but appears  to be responding appropriately. Needs max cues to verbalize --mention of her children usually improves interaction and engagement.  Right upper extremity/right lower extremity: 0/5 proximal distal.  Left upper extremity: 4/5 proximal distal Left lower extremity: 4-/5 proximal distal  Skin: She is not diaphoretic.  Crani incision c/d/i  Psychiatric: Her affect is blunt.  Nonverbal    No results found for this or any previous visit (from the past 48 hour(s)). No results found.     Medical Problem List and Plan: 1.  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 2.  DVT Prophylaxis/Anticoagulation: Mechanical: Sequential compression devices, below knee Bilateral lower extremities 3. Pain Management: Tylenol prn.  4. Mood: Team to provide ego support.  LCSW to follow for evaluation and support.  5. Neuropsych: This patient is not fully capable of making decisions on her own behalf. 6. Skin/Wound Care: Monitor incision for healing.  7. Fluids/Electrolytes/Nutrition: Monitor I/O. Check lytes in am. Offer supplements if intake poor.  8. HTN: Monitor BP bid--continue amlodipine and nimotop.   9. Moraxella HCAP: Has completed antibiotic regimen on 12/4 10. ABLA: Continue to monitor H/H serially.  11.  Impaired fasting glucose: Likely stress related. Will check Hgb A1C in am.      Post Admission Physician Evaluation: 1. Preadmission assessment reviewed and changes made below. 2. Functional deficits secondary  to [place] territory infarct. 3. Patient is admitted to receive collaborative, interdisciplinary care between the physiatrist, rehab nursing staff, and therapy team. 4. Patient's level of medical complexity and substantial therapy needs in context of that medical necessity cannot be provided at a lesser intensity of care such as  a SNF. 5. Patient has experienced substantial functional loss from his/her baseline which was documented above under the  "Functional History" and "Functional Status" headings.  Judging by the patient's diagnosis, physical exam, and functional history, the patient has potential for functional progress which will result in measurable gains while on inpatient rehab.  These gains will be of substantial and practical use upon discharge  in facilitating mobility and self-care at the household level. 6. Physiatrist will provide 24 hour management of medical needs as well as oversight of the therapy plan/treatment and provide guidance as appropriate regarding the interaction of the two. 7. 24 hour rehab nursing will assist with safety, disease management, medication administration and patient education  and help integrate therapy concepts, techniques,education, etc. 8. PT will assess and treat for/with: Lower extremity strength, range of motion, stamina, balance, functional mobility, safety, adaptive techniques and equipment, wound care, coping skills, pain control, education. Goals are: Min/mod a. 9. OT will assess and treat for/with: ADL's, functional mobility, safety, upper extremity strength, adaptive techniques and equipment, wound mgt, ego support, and community reintegration.   Goals are: Min/mod a. Therapy may proceed with showering this patient. 10. SLP will assess and treat for/with: Speech, language.  Goals are: Supervision/min a. 11. Case Management and Social Worker will assess and treat for psychological issues and discharge planning. 12. Team conference will be held weekly to assess progress toward goals and to determine barriers to discharge. 13. Patient will receive at least 3 hours of therapy per day at least 5 days per week. 14. ELOS: 23-28 days.       15. Prognosis:  good  I have personally performed a face to face diagnostic evaluation, including, but not limited to relevant history and physical exam findings, of this patient and developed relevant assessment and plan.  Additionally, I have reviewed and concur  with the physician assistant's documentation above.  The patient's status has not changed. The original post admission physician evaluation remains appropriate, and any changes from the pre-admission screening or documentation from the acute chart are noted above.    Delice Lesch, MD, ABPMR Bary Leriche, PA-C 05/27/2018

## 2018-05-27 NOTE — Progress Notes (Signed)
Transcranial Doppler  Date POD PCO2 HCT BP  MCA ACA PCA OPHT SIPH VERT Basilar  11/27 JE     Right  Left   101  28   -47  *   25  *   20  31   52  53   -47  -57   -32      11/29 JE     Right  Left   88  52   -36  *   19  *   32  23   79  45   *  *   *      12/2 RS     Right  Left                                       12/4 JE      Right  Left   40  *   *  *   *  *   35  36   14  43   -19  -16   -44       12/6 MS     Right  Left   29  23   *  *   *  *   27  28   24  24    -27  -29   -40      12/9 MR     Right  Left   72  83   -30  *   *  *   21  28   42  67   *  *   *           Right  Left                                        MCA = Middle Cerebral Artery      OPHT = Opthalmic Artery     BASILAR = Basilar Artery   ACA = Anterior Cerebral Artery     SIPH = Carotid Siphon PCA = Posterior Cerebral Artery   VERT = Verterbral Artery                   Normal MCA = 62+\-12 ACA = 50+\-12 PCA = 42+\-23   ^^^ limited left temp window is due to bandage location. 05/27/18 *Unable to insonate.  [name] [name] 05/27/2018 3:14 PM

## 2018-05-27 NOTE — Progress Notes (Signed)
Inpatient Rehabilitation Admissions Coordinator  I met with patient with her RN, Judson Roch, at bedside. I then contacted her Mom, Rosendo Gros, to offer to admit pt to inpt rehab for a couple of weeks to decrease burden of care, before she discharges to SNF rehab to further her therapy. I contacted Vinnie, PA for neuro and we will prepare her for d/c to inpt rehab today. I will notify RN CM and SW.   Danne Baxter, RN, MSN Rehab Admissions Coordinator [phone] 05/27/2018 1:09 PM

## 2018-05-27 NOTE — Progress Notes (Signed)
[name] Gong, RN  Rehab Admission Coordinator  Physical Medicine and Rehabilitation  PMR Pre-admission  Signed  Date of Service:  05/27/2018 1:51 PM       Related encounter: ED to Hosp-Admission (Current) from 05/09/2018 in [place] NEURO/TRAUMA/SURGICAL ICU      Signed         Show:Clear all [x] Manual[x] Template[x] Copied  Added by: [x] Julious Payer Vertis Kelch, RN  [] Hover for details PMR Admission Coordinator Pre-Admission Assessment  Patient: [name] [name] is an 32 y.o., female MRN: [id number] DOB: 1985/07/19 Height: 5\' 10"  (177.8 cm) Weight: 92.9 kg                                                                                                                                                  Insurance Information HMO:     PPO:      PCP:      IPA:      80/20:      OTHER:  PRIMARY: Medicaid [place] Access      Policy#: [id number]      Subscriber: pt Benefits:  Phone #: [phone]     Name: automated system Eff. Date: active   [place]  Medicaid Application Date:       Case Manager:  Disability Application Date:       Case Worker:  05/27/2018 I left voicemail for [name] [name] at [phone] to question if MAB has coverage for SNF. I await return call.  Emergency Contact Information         Contact Information    Name Relation Home Work Mobile   [name],[name] Mother   [name], [name]   [phone]     Current Medical History  Patient Admitting Diagnosis: left ACA infarct  History of Present Illness: [name] [name] is a 32 year old female with history of HTN, eczema, chronic allergic rhinitis, visual deficits;who was admitted via ED on 05/09/2018 with bilateral neck pain, vomiting and tightness radiating down her back to her buttocks. 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 Cerebral angiogram with attempts at coiling not amenable  therefore patient underwent Left pterional crani with aneurysm clipping by Dr. Kathyrn Sheriff. Post op, patient developed right sided weakness with somnolence as well as elevated BP requiring Cleviprex for BP control and Nimotop to manage vasospasm. CT head showed resolution of hydrocephalus and repeat CT 11/25 revealed left ACA territory hypo attenuation consistent with early subacute ischemia.   She was treated with IV antibioticsdue to HCAPfrom Moraxella catarrhalis. She tolerated extubation without difficulty and is tolerating regular textures. She continues on Nimotop thorough 12/24. Verbal output and activity tolerance improving and CIR recommended due to functional deficits.  Complete NIHSS TOTAL: 9  Past Medical History      Past Medical History:  Diagnosis Date  . Asthma, allergic    uses inhaler prn - infrequently  . Bilateral thoracic back pain 09/13/2015  . Eczema   . Hypertension   . Lactose intolerance   . Mature cystic teratoma of ovary   . Mood swings 06/01/2015  . Seasonal allergies     Family History  family history includes Eczema in her maternal grandmother and mother; Hypertension in her father, maternal aunt, maternal grandfather, maternal grandmother, maternal uncle, mother, and paternal grandmother. She was adopted.  Prior Rehab/Hospitalizations:  Has the patient had major surgery during 100 days prior to admission? No  Current Medications   Current Facility-Administered Medications:  .  0.9 %  sodium chloride infusion, , Intravenous, PRN, Chesley Mires, MD, Stopped at 05/16/18 0919 .  acetaminophen (TYLENOL) tablet 650 mg, 650 mg, Oral, Q4H PRN **OR** [DISCONTINUED] acetaminophen (TYLENOL) solution 650 mg, 650 mg, Per Tube, Q4H PRN, 650 mg at 05/19/18 1320 **OR** acetaminophen (TYLENOL) suppository 650 mg, 650 mg, Rectal, Q4H PRN, Erline Levine, MD .  amLODipine (NORVASC) tablet 10 mg, 10 mg, Oral, Daily, Consuella Lose, MD, 10 mg at 05/27/18  0925 .  famotidine (PEPCID) 40 MG/5ML suspension 20 mg, 20 mg, Oral, QHS, Consuella Lose, MD, 20 mg at 05/26/18 2108 .  feeding supplement (ENSURE ENLIVE) (ENSURE ENLIVE) liquid 237 mL, 237 mL, Oral, BID BM, Consuella Lose, MD, 237 mL at 05/26/18 1348 .  hydrALAZINE (APRESOLINE) injection 5-20 mg, 5-20 mg, Intravenous, Q4H PRN, Judith Part, MD .  loratadine (CLARITIN) tablet 10 mg, 10 mg, Oral, Daily, Consuella Lose, MD, 10 mg at 05/27/18 0925 .  montelukast (SINGULAIR) tablet 10 mg, 10 mg, Oral, QHS, Consuella Lose, MD, 10 mg at 05/26/18 2108 .  multivitamin with minerals tablet 1 tablet, 1 tablet, Oral, Daily, Rae Mar, RPH, 1 tablet at 05/27/18 0925 .  niMODipine (NIMOTOP) capsule 60 mg, 60 mg, Oral, Q4H, 60 mg at 05/27/18 0218 **OR** niMODipine (NYMALIZE) 60 MG/20ML oral solution 60 mg, 60 mg, Oral, Q4H, Corinda Gubler, RPH, 60 mg at 05/27/18 0925 .  ondansetron (ZOFRAN) injection 4 mg, 4 mg, Intravenous, Q6H PRN, Erline Levine, MD, 4 mg at 05/15/18 2257  Patients Current Diet:     Diet Order                  Diet regular Room service appropriate? 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 Disabled due to vision deficits. Mom states she needs cornea transplant.  Home Assistive Devices / Equipment Home Assistive Devices/Equipment: None  Prior Device Use: Indicate devices/aids used by the patient prior to current illness, exacerbation or injury? 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  Independent  Indoor Mobility: Did the patient need assistance with walking from room to room (with or without device)? Independent  Stairs: Did the patient need assistance with internal or external stairs (with or without device)? Independent  Functional Cognition:  Did the patient need help planning regular tasks such as shopping or remembering to take medications? Independent  Current Functional Level Cognition  Arousal/Alertness: Awake/alert Overall Cognitive Status: Difficult to assess Difficult to assess due to: Impaired communication Current Attention Level: Selective Orientation Level: Oriented to [name], Oriented to place, Oriented to situation, Disoriented to time Following Commands: Follows one step commands inconsistently, Follows one step commands with increased time Safety/Judgement: Decreased awareness of safety, Decreased awareness of deficits General Comments: pt able to follow 1 step commands given increased time, verbalizes a little with encouragement Attention: Focused, Sustained Focused Attention: Appears intact Sustained Attention: Impaired Sustained Attention Impairment: Verbal complex, Functional complex Problem Solving: Impaired Problem Solving Impairment: Verbal basic, Functional basic Executive Function: Initiating    Extremity Assessment (includes Sensation/Coordination)  Upper Extremity Assessment: RUE deficits/detail RUE Deficits / Details: active movement noted in hand today (gross grasp/extension) with inconsistent performance, otherwise no active movement proxmially RUE Sensation: decreased light touch, decreased proprioception RUE Coordination: decreased fine motor, decreased gross motor  Lower Extremity Assessment: RLE deficits/detail(L side moved voluntarily) RLE Deficits / Details: no spontaneous movement noted RLE  Coordination: decreased fine motor, decreased gross motor    ADLs  Overall ADL's : Needs assistance/impaired Eating/Feeding: NPO Grooming: Wash/dry hands, Wash/dry face, Moderate assistance Grooming Details (indicate cue type and reason): demonstrates ability to wash face with supervision (cueing to complete all parts), and wash hands with mod assist for L hand Upper Body Bathing: Maximal assistance, Sitting Lower Body Bathing: Total assistance, +2 for physical assistance, Sitting/lateral leans Upper Body Dressing : Total assistance, Sitting Lower Body Dressing: Total assistance, +2 for physical assistance, Sit to/from stand Toilet Transfer: Maximal assistance, +2 for physical assistance, +2 for safety/equipment, Squat-pivot Toilet Transfer Details (indicate cue type and reason): simulated to recliner  Toileting- Clothing Manipulation and Hygiene: Total assistance, +2 for physical assistance, Bed level Toileting - Clothing Manipulation Details (indicate cue type and reason): total assist +3 to clean up after recal tube leaking in bed  Functional mobility during ADLs: Total assistance, Maximal assistance, +2 for physical assistance General ADL Comments: patient limited by R hemiparesis, impaired cognition and communication, and impaired balance     Mobility  Overal bed mobility: Needs Assistance Bed Mobility: Rolling, Sidelying to Sit Rolling: Min assist Sidelying to sit: Mod assist General bed mobility comments: min assist to roll to right with cues and assist to move RLE, rise from right with mod assist to bring legs off of bed and use of LUE to assist with trunk elevation. Pt with right lean in sitting able to correct with left elbow propping or grabbing footboard to left    Transfers  Overall transfer level: Needs assistance Equipment used: None Transfer via Lift Equipment: Stedy Transfers: Sit to/from Stand, Technical brewer Transfers Sit to Stand: Mod assist, +2 physical  assistance Stand pivot transfers: Max assist, +2 physical assistance Squat pivot transfers: Max assist, +2 safety/equipment, +2 physical assistance General transfer comment: mod assist +2 to stand from bed and chair with cues and heavy use of left side to power up. In standing right knee blocked, bil UE support and cues for lateral shift to left. Pt able to pivot to right with assist to advance RLE and assist to slide LLE. Standing 2nd trials grossly 4 min for facilitation of trunk extension and weight shift left, no activation of right quad in standing    Ambulation / Gait / Stairs / Wheelchair Mobility  Ambulation/Gait General Gait Details: unable    Posture /  Balance Dynamic Sitting Balance Sitting balance - Comments: min assist with LUE support on rail, max assist with no UE support Balance Overall balance assessment: Needs assistance Sitting-balance support: Single extremity supported, Feet supported Sitting balance-Leahy Scale: Poor Sitting balance - Comments: min assist with LUE support on rail, max assist with no UE support Postural control: Right lateral lean Standing balance support: Bilateral upper extremity supported, During functional activity Standing balance-Leahy Scale: Zero Standing balance comment: 2 [name] max assist in standing for balance with right lean    Special needs/care consideration BiPAP/CPAP n/a CPM n/a Continuous Drip IV n/a Dialysis n/a Life Vest n/a Oxygen n/a Special Bed n/a Trach Size n/a Wound Vac n/a Skin staples to surgical incision Bowel mgmt: diarrhea with rectal tube removed 12/9; RNs states feel diarrhea due to nutritional supplements Bladder mgmt: external catheter Diabetic mgmt n/a Patient with vision loss pta   Previous Home Environment Living Arrangements: (lives alone with ther two yong children ( 64 and 13 year olds))  Lives With: (two young children) Available Help at Discharge: Other (Comment)(plans to d/c to SNF due lack of  caregiver support at home) Type of Home: Apartment Home Layout: Two level Alternate Level Stairs-Rails: Left Alternate Level Stairs-Number of Steps: flight Home Access: Stairs to enter Entrance Stairs-Rails: None Entrance Stairs-Number of Steps: 3 Bathroom Shower/Tub: Public librarian, Industrial/product designer: No Home Care Services: No Additional Comments: information provided by pts aunt   Discharge Living Setting Plans for Discharge Living Setting: Other (Comment)(plans to d/c to SNF when inpt rehab venue complete) Type of Home at Discharge: [place] Name at Discharge: To be determined with assistance from Georga Kaufmann with Care managment department Discharge Home Layout: (SNF rehab) Does the patient have any problems obtaining your medications?: No  Social/Family/Support Systems Patient Roles: Parent Contact Information: MOm, Rosendo Gros [name] is her NOK, her [place] Charon is also involved Anticipated Caregiver: SNF rehab Anticipated Caregiver's Contact Information: [phone] Ability/Limitations of Caregiver: Mom and Elenor Legato both work during the week; patient's [employer] is caring for the two kids   Caregiver Availability: (SNF ) Discharge Plan Discussed with Primary Caregiver: Yes Is Caregiver In Agreement with Plan?: Yes Does Caregiver/Family have Issues with Lodging/Transportation while Pt is in Rehab?: No  Patient's grandmother is caring for the two young children as well as Grandmother's spouse who is w/c level at home. Pt's Mom and Aunt assisting on the weekends with the two young children.  Goals/Additional Needs Patient/Family Goal for Rehab: min to mod PT , OT, and SLP  Expected length of stay: ELOS 25 to 30 days and then SNF to decrease burden of care Pt/Family Agrees to Admission and willing to participate: Yes Program Orientation Provided & Reviewed with Pt/Caregiver Including Roles  & Responsibilities:  Yes  Barriers to Discharge: Decreased caregiver support, Lack of/limited family support  Barriers to Discharge Comments: Plans to d/c to SNF after inpt rehab stay  Decrease burden of Care through IP rehab admission: Decrease number of caregivers, Bowel and bladder program and Patient/family education  Possible need for SNF placement upon discharge: SNF rehab is the d/c dispo after CIR. Georga Kaufmann with care management to assist CIR SW with obtaining a bed after a 2 to 3 week CIR stay.  Patient Condition: This patient's medical and functional status has changed since the consult dated 05/21/2018 in which the Rehabilitation Physician determined and documented that the patient was potentially appropriate for intensive rehabilitative care in an inpatient rehabilitation  facility. Issues have been addressed and update has been discussed with Dr. Posey Pronto and patient now appropriate for inpatient rehabilitation. Will admit to inpatient rehab today.   Preadmission Screen Completed By:  Cleatrice Burke, 05/27/2018 1:52 PM ______________________________________________________________________   Discussed status with Dr. Posey Pronto on 05/27/2018 at 1400 and received telephone approval for admission today.  Admission Coordinator:  Cleatrice Burke, time 8721 Date 05/27/2018           Cosigned by: Jamse Arn, MD at 05/27/2018 2:15 PM  Revision History

## 2018-05-27 NOTE — Progress Notes (Signed)
Report given to RN on Rehab unit. Pt is completing her TCD's at this time, will transfer after that. Family called and updated. Belongings packed up and will go with pt. Aero Drummonds, Rande Brunt, RN

## 2018-05-28 ENCOUNTER — Inpatient Hospital Stay (HOSPITAL_COMMUNITY): Payer: Medicaid Other

## 2018-05-28 ENCOUNTER — Encounter (HOSPITAL_COMMUNITY): Payer: Self-pay | Admitting: Neurosurgery

## 2018-05-28 ENCOUNTER — Inpatient Hospital Stay (HOSPITAL_COMMUNITY): Payer: Medicaid Other | Admitting: Speech Pathology

## 2018-05-28 ENCOUNTER — Inpatient Hospital Stay (HOSPITAL_COMMUNITY): Payer: Medicaid Other | Admitting: Occupational Therapy

## 2018-05-28 DIAGNOSIS — D62 Acute posthemorrhagic anemia: Secondary | ICD-10-CM

## 2018-05-28 DIAGNOSIS — I10 Essential (primary) hypertension: Secondary | ICD-10-CM

## 2018-05-28 DIAGNOSIS — R609 Edema, unspecified: Secondary | ICD-10-CM

## 2018-05-28 DIAGNOSIS — I609 Nontraumatic subarachnoid hemorrhage, unspecified: Secondary | ICD-10-CM

## 2018-05-28 DIAGNOSIS — R4701 Aphasia: Secondary | ICD-10-CM

## 2018-05-28 DIAGNOSIS — I639 Cerebral infarction, unspecified: Secondary | ICD-10-CM

## 2018-05-28 LAB — COMPREHENSIVE METABOLIC PANEL
ALT: 61 U/L — ABNORMAL HIGH (ref 0–44)
ANION GAP: 12 (ref 5–15)
AST: 32 U/L (ref 15–41)
Albumin: 3.1 g/dL — ABNORMAL LOW (ref 3.5–5.0)
Alkaline Phosphatase: 71 U/L (ref 38–126)
BUN: 13 mg/dL (ref 6–20)
CO2: 25 mmol/L (ref 22–32)
Calcium: 9.7 mg/dL (ref 8.9–10.3)
Chloride: 105 mmol/L (ref 98–111)
Creatinine, Ser: 0.57 mg/dL (ref 0.44–1.00)
GFR calc non Af Amer: >60 mL/min (ref >60)
Glucose, Bld: 101 mg/dL — ABNORMAL HIGH (ref 70–99)
POTASSIUM: 3.6 mmol/L (ref 3.5–5.1)
Sodium: 142 mmol/L (ref 135–145)
Total Bilirubin: 0.8 mg/dL (ref 0.3–1.2)
Total Protein: 6.9 g/dL (ref 6.5–8.1)

## 2018-05-28 LAB — CBC WITH DIFFERENTIAL/PLATELET
Abs Immature Granulocytes: 0.02 10*3/uL (ref 0.00–0.07)
Basophils Absolute: 0 10*3/uL (ref 0.0–0.1)
Basophils Relative: 0 %
Eosinophils Absolute: 0.1 10*3/uL (ref 0.0–0.5)
Eosinophils Relative: 3 %
HCT: 36.9 % (ref 36.0–46.0)
Hemoglobin: 11.6 g/dL — ABNORMAL LOW (ref 12.0–15.0)
Immature Granulocytes: 1 %
Lymphocytes Relative: 34 %
Lymphs Abs: 1.4 10*3/uL (ref 0.7–4.0)
MCH: 31.3 pg (ref 26.0–34.0)
MCHC: 31.4 g/dL (ref 30.0–36.0)
MCV: 99.5 fL (ref 80.0–100.0)
Monocytes Absolute: 0.4 10*3/uL (ref 0.1–1.0)
Monocytes Relative: 11 %
Neutro Abs: 2.1 10*3/uL (ref 1.7–7.7)
Neutrophils Relative %: 51 %
Platelets: 245 10*3/uL (ref 150–400)
RBC: 3.71 MIL/uL — ABNORMAL LOW (ref 3.87–5.11)
RDW: 12.5 % (ref 11.5–15.5)
WBC: 4 10*3/uL (ref 4.0–10.5)
nRBC: 0 % (ref 0.0–0.2)

## 2018-05-28 LAB — HEMOGLOBIN A1C
Hgb A1c MFr Bld: 4.3 % — ABNORMAL LOW (ref 4.8–5.6)
MEAN PLASMA GLUCOSE: 76.71 mg/dL

## 2018-05-28 MED ORDER — NIMODIPINE 60 MG/20ML PO SOLN
60.0000 mg | ORAL | Status: AC
  Administered 2018-05-28 – 2018-05-30 (×15): 60 mg via ORAL
  Filled 2018-05-28 (×15): qty 20

## 2018-05-28 NOTE — Progress Notes (Signed)
Notified PA DVT rt calf

## 2018-05-28 NOTE — Progress Notes (Signed)
Rt perineum area abrasion and left labia swollen r/t PureWick.

## 2018-05-28 NOTE — Progress Notes (Signed)
Orthopedic Tech Progress Note Patient Details:  [name] [name] August 17, 1985 [id number] Was notified by hanger brace had been order.   Patient ID: [name] [name], female   DOB: 1985-10-23, 32 y.o.   MRN: [id number]   Braulio Bosch 05/28/2018, 12:59 PM

## 2018-05-28 NOTE — Evaluation (Signed)
Speech Language Pathology Assessment and Plan  Patient Details  Name: [name] [name] MRN: [id number] Date of Birth: 1986-05-04  SLP Diagnosis: Aphasia;Cognitive Impairments  Rehab Potential: Good ELOS: 21-28 days     Today's Date: 05/28/2018 SLP Individual Time: 1035-1130 SLP Individual Time Calculation (min): 55 min   Problem List:  Patient Active Problem List   Diagnosis Date Noted  . Nonverbal   . Essential hypertension   . Hyperglycemia   . SAH (subarachnoid hemorrhage) ([place])   . Acute cerebral infarction ([place])   . HCAP (healthcare-associated pneumonia)   . Benign essential HTN   . Acute blood loss anemia   . Right hemiplegia ([place])   . 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 Endotracheal tube present   . Brain aneurysm   . Subarachnoid hemorrhage ([place]) 05/09/2018  . Gastroesophageal reflux disease without esophagitis 10/09/2016  . BMI 31.0-31.9,adult 07/20/2016  . Obesity in pregnancy, antepartum 07/20/2016  . Tobacco abuse 06/01/2015  . Marijuana use 06/23/2014   Past Medical History:  Past Medical History:  Diagnosis Date  . Asthma, allergic    uses inhaler prn - infrequently  . Bilateral thoracic back pain 09/13/2015  . Eczema   . Hypertension   . Lactose intolerance   . Mature cystic teratoma of ovary   . Mood swings 06/01/2015  . Seasonal allergies    Past Surgical History:  Past Surgical History:  Procedure Laterality Date  . CRANIOTOMY N/A 05/10/2018   Procedure: 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;  Surgeon: Consuella Lose, MD;  Location: [facility];  Service: Neurosurgery;  Laterality: N/A;  . IR 3D INDEPENDENT WKST  05/10/2018  . IR ANGIO INTRA EXTRACRAN SEL INTERNAL CAROTID BILAT MOD SED  05/10/2018  . IR ANGIO VERTEBRAL SEL VERTEBRAL BILAT MOD SED  05/10/2018  . RADIOLOGY WITH ANESTHESIA N/A 05/10/2018   Procedure: EMBOLIZATION OF ANEURSYM;  Surgeon: Consuella Lose, MD;  Location: [facility];  Service: Radiology;  Laterality: N/A;  . TUBAL LIGATION N/A 12/15/2016   Procedure: POST PARTUM TUBAL LIGATION;  Surgeon: Mora Bellman, MD;  Location: [facility];  Service: Gynecology;  Laterality: N/A;  . WISDOM TOOTH EXTRACTION      Assessment / Plan / Recommendation Clinical Impression   [name] [name] is a 32 year old female with history of HTN, eczema, chronic allergic rhinitis, visual deficits; who was admitted via ED on 05/09/2018 with bilateral neck pain, vomiting and tightness radiating down her back to her buttocks.  History taken from chart review.  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 Cerebral angiogram with attempts at coiling not ameable therefore patient underwent Left pterional crani with aneurysm clipping by Dr. Kathyrn Sheriff. Post op, patient developed right sided weakness with somnolence as well as elevated BP requiring Cleviprex for BP control and Nimotop to manage vasospasm. CT head showed resolution of hydrocephalus and repeat CT 11/25 reviewed, showing left ACA infarct.  Per report, left ACA territory hypoattenuation consistent with early subacute ischemia. 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 She tolerated extubation without difficulty and is tolerating regular textures. She continues on nimotop thorough 12/24. Verbal output and activity tolerance improving and CIR recommended due to functional deficits.   Pt admitted to CIR on 05/27/2018 and SLP evaluation was completed on 05/28/2018 with the following results:  Pt presents with moderately severe functional cognitive-linguistic deficits.  Pt appears to have  at least expressive aphasia as she is able to answer yes/no questions reliably and follow 1-step commands with increased time and cuing but has significantly increased response latency and appears to have word finding deficits.  Her  spontaneous verbal output when talking about familiar subjects like her children is better than that elicited during more structured naming tasks.  Suspect attention and initiation are also contributing to her decreased functional communication.  As a result, pt currently needs max cues to complete tasks.  Pt seems to have some level of awareness of her deficits as she answered that her "thinking" is altered from baseline when asked but is not currently making attempts to correct.   Given the abovementioned deficits, pt would benefit from skilled ST while inpatient in order to maximize functional independence and reduce burden of care prior to discharge.  Per PMR pre-admission paperwork, pt's plan post CIR is SNF placement due to family's inability to provide 24/7 supervision at discharge.     Skilled Therapeutic Interventions          Cognitive-linguistic evaluation completed with results and recommendations reviewed with patient.     SLP Assessment  Patient will need skilled [facility] during CIR admission    Recommendations  Patient destination: [facility] (SNF) Follow up Recommendations: Skilled Nursing facility Equipment Recommended: None recommended by SLP    SLP Frequency 3 to 5 out of 7 days   SLP Duration  SLP Intensity  SLP Treatment/Interventions 21-28 days   Minumum of 1-2 x/day, 30 to 90 minutes  Cognitive remediation/compensation;Cueing hierarchy;Environmental controls;Functional tasks;Internal/external aids;Patient/family education    Pain Pain Assessment Pain Scale: 0-10 Pain Score: 0-No pain  Prior Functioning Cognitive/Linguistic Baseline: Within functional limits Type of Home: Apartment  Lives With: Other (Comment)(two young children) Available Help at Discharge: [place]: On disability  Short Term Goals: Week 1: SLP Short Term Goal 1 (Week 1): Pt will initiate verbal response to question within 30  seconds in >25% of opportunities with mod assist multimodal cues.   SLP Short Term Goal 2 (Week 1): Pt will use compensatory strategies for mildly complex word finding during functional tasks with mod assist multimodal cues.   SLP Short Term Goal 3 (Week 1): Pt will sustain her attention to basic, familiar tasks for 5 minutes with mod assist multimodal cues for redirection.   SLP Short Term Goal 4 (Week 1): Pt will complete basic, familiar tasks with mod assist multimodal cues for functional problem solving.    Refer to Care Plan for Long Term Goals  Recommendations for other services: None   Discharge Criteria: Patient will be discharged from SLP if patient refuses treatment 3 consecutive times without medical reason, if treatment goals not met, if there is a change in medical status, if patient makes no progress towards goals or if patient is discharged from hospital.  The above assessment, treatment plan, treatment alternatives and goals were discussed and mutually agreed upon: by patient  Emilio Math 05/28/2018, 4:04 PM

## 2018-05-28 NOTE — Progress Notes (Signed)
[facility] PHYSICAL MEDICINE & REHABILITATION PROGRESS NOTE  Subjective/Complaints: Patient seen laying in bed this morning.  She indicates she slept well overnight.  She indicates she is ready to begin therapies today.  ROS: Denies CP, SOB, N/V/D.  Objective: Vital Signs: Blood pressure (!) 95/59, pulse 85, temperature 98.5 F (36.9 C), temperature source Oral, resp. rate 18, height 5\' 10"  (1.778 m), weight 89.5 kg, last menstrual period 05/03/2018, SpO2 97 %, currently breastfeeding. Vas [name] Transcranial Doppler  Result Date: 05/27/2018  Transcranial Doppler Indications: Subarachnoid hemorrhage. Performing Technologist: Abram Sander RVS  Examination Guidelines: A complete evaluation includes B-mode imaging, spectral Doppler, color Doppler, and power Doppler as needed of all accessible portions of each vessel. Bilateral testing is considered an integral part of a complete examination. Limited examinations for reoccurring indications may be performed as noted.  +----------+-------------+----------+-----------+-------+ RIGHT TCD Right VM (cm)Depth (cm)PulsatilityComment +----------+-------------+----------+-----------+-------+ MCA           72.00                 0.77            +----------+-------------+----------+-----------+-------+ ACA          -30.00                 0.70            +----------+-------------+----------+-----------+-------+ Term ICA      33.00                 1.05            +----------+-------------+----------+-----------+-------+ Opthalmic     21.00                 1.32            +----------+-------------+----------+-----------+-------+ ICA siphon    42.00                 0.64            +----------+-------------+----------+-----------+-------+ Distal ICA    27.00                 1.47            +----------+-------------+----------+-----------+-------+  +----------+------------+----------+-----------+-------+ LEFT TCD  Left VM (cm)Depth  (cm)PulsatilityComment +----------+------------+----------+-----------+-------+ MCA          83.00                 0.85            +----------+------------+----------+-----------+-------+ Opthalmic    28.00                 1.42            +----------+------------+----------+-----------+-------+ ICA siphon   33.00                 1.43            +----------+------------+----------+-----------+-------+  +----------------------+----+ Right Lindegaard Ratio2.66 +----------------------+----+    Preliminary    Recent Labs    05/28/18 0614  WBC 4.0  HGB 11.6*  HCT 36.9  PLT 245   No results for input(s): NA, K, CL, CO2, GLUCOSE, BUN, CREATININE, CALCIUM in the last 72 hours.  Physical Exam: BP (!) 95/59 (BP Location: Left Arm)   Pulse 85   Temp 98.5 F (36.9 C) (Oral)   Resp 18   Ht 5\' 10"  (8.242 m)   Wt 89.5 kg   LMP 05/03/2018   SpO2 97%   BMI 28.31 kg/m  Constitutional: She appears  well-developed. NAD. Obese  HENT: Atraumatic. Left crani incision  Eyes: EOMI. No discharge.  Cardiovascular: Normal rate and regular rhythm. No JVD. Respiratory: Effort normal and breath sounds normal.  GI: Bowel sounds are normal. She exhibits no distension. Musculoskeletal: No edema or tenderness in extremities  Neurological: She is alert.  Nonverbal, but appears to be responding appropriately.  Right upper extremity/right lower extremity: 0/5 proximal to distal.  Left upper extremity: 4/5 proximal distal Left lower extremity: 4-/5 proximal distal  Skin: She is not diaphoretic. Crani incision c/d/i  Psychiatric: Her affect is blunt. Nonverbal   Assessment/Plan: 1. Functional deficits secondary to left ACA with SAH which require 3+ hours per day of interdisciplinary therapy in a comprehensive inpatient rehab setting.  Physiatrist is providing close team supervision and 24 hour management of active medical problems listed below.  Physiatrist and rehab team continue to assess  barriers to discharge/monitor patient progress toward functional and medical goals  Care Tool:  Bathing              Bathing assist       Upper Body Dressing/Undressing Upper body dressing   What is the patient wearing?: Hospital gown only    Upper body assist Assist Level: Total Assistance - Patient < 25%    Lower Body Dressing/Undressing Lower body dressing      What is the patient wearing?: Incontinence brief     Lower body assist Assist for lower body dressing: Total Assistance - Patient < 25%     Toileting Toileting Toileting Activity did not occur Landscape architect and hygiene only): Safety/medical concerns  Toileting assist Assist for toileting: Total Assistance - Patient < 25%     Transfers Chair/bed transfer  Transfers assist  Chair/bed transfer activity did not occur: N/A        Locomotion Ambulation   Ambulation assist              Walk 10 feet activity   Assist           Walk 50 feet activity   Assist           Walk 150 feet activity   Assist           Walk 10 feet on uneven surface  activity   Assist           Wheelchair     Assist               Wheelchair 50 feet with 2 turns activity    Assist            Wheelchair 150 feet activity     Assist            Medical Problem List and Plan: 1.  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  Begin CIR 2.  DVT Prophylaxis/Anticoagulation: Mechanical: Sequential compression devices, below knee Bilateral lower extremities 3. Pain Management: Tylenol prn.  4. Mood: Team to provide ego support.  LCSW to follow for evaluation and support.  5. Neuropsych: This patient is not fully capable of making decisions on her own behalf. 6. Skin/Wound Care: Monitor incision for healing.  7. Fluids/Electrolytes/Nutrition: Monitor I/Os. Offer supplements if intake poor.   BMP pending on 12/10 8.  HTN: Monitor BP bid--continue amlodipine and nimotop.    Monitor with increased mobility 9. Moraxella HCAP: Has completed antibiotic regimen on 12/4 10. ABLA: Continue to monitor H/H serially.   Hb 11.6 on 12/10 11.  Impaired fasting  glucose: Likely stress related.   HbA1c 4/3 on 12/10  LOS: 1 days A FACE TO FACE EVALUATION WAS PERFORMED  Kerri-Anne Haeberle Lorie Phenix 05/28/2018, 7:22 AM

## 2018-05-28 NOTE — Progress Notes (Signed)
Inpatient Rehabilitation  Patient information reviewed and entered into eRehab system by Khristen Cheyney M. Pietrina Jagodzinski, M.A., CCC/SLP, PPS Coordinator.  Information including medical coding, functional ability and quality indicators will be reviewed and updated through discharge.    

## 2018-05-28 NOTE — Evaluation (Signed)
Physical Therapy Assessment and Plan  Patient Details  Name: [name] [name] MRN: [id number] Date of Birth: [birth year]  PT Diagnosis: Abnormality of gait, Difficulty walking, Hemiplegia dominant, Hypotonia, Impaired sensation and Muscle weakness Rehab Potential: Fair ELOS: 21-28 days   Today's Date: 05/28/2018 PT Individual Time: 0750-0850 PT Individual Time Calculation (min): 60 min    Problem List:  Patient Active Problem List   Diagnosis Date Noted  . Nonverbal   . Essential hypertension   . Hyperglycemia   . SAH (subarachnoid hemorrhage) ([place])   . Acute cerebral infarction ([place])   . HCAP (healthcare-associated pneumonia)   . Benign essential HTN   . Acute blood loss anemia   . Right hemiplegia ([place])   . 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 Endotracheal tube present   . Brain aneurysm   . Subarachnoid hemorrhage ([place]) 05/09/2018  . Gastroesophageal reflux disease without esophagitis 10/09/2016  . BMI 31.0-31.9,adult 07/20/2016  . Obesity in pregnancy, antepartum 07/20/2016  . Tobacco abuse 06/01/2015  . Marijuana use 06/23/2014    Past Medical History:  Past Medical History:  Diagnosis Date  . Asthma, allergic    uses inhaler prn - infrequently  . Bilateral thoracic back pain 09/13/2015  . Eczema   . Hypertension   . Lactose intolerance   . Mature cystic teratoma of ovary   . Mood swings 06/01/2015  . Seasonal allergies    Past Surgical History:  Past Surgical History:  Procedure Laterality Date  . CRANIOTOMY N/A 05/10/2018   Procedure: 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;  Surgeon: Consuella Lose, MD;  Location: [facility];  Service: Neurosurgery;  Laterality: N/A;  . RADIOLOGY WITH ANESTHESIA N/A 05/10/2018   Procedure: EMBOLIZATION OF ANEURSYM;  Surgeon: Consuella Lose, MD;  Location: [facility];  Service: Radiology;  Laterality: N/A;  . TUBAL LIGATION N/A 12/15/2016    Procedure: POST PARTUM TUBAL LIGATION;  Surgeon: Mora Bellman, MD;  Location: [facility];  Service: Gynecology;  Laterality: N/A;  . WISDOM TOOTH EXTRACTION      Assessment & Plan Clinical Impression: [name] [name] is a 32 year old female with history of HTN, eczema, chronic allergic rhinitis, visual deficits;who was admitted via ED on 05/09/2018 with bilateral neck pain, vomiting and tightness radiating down her back to her buttocks. 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 Cerebral angiogram with attempts at coiling notamenabletherefore patient underwent Left pterional crani with aneurysm clipping by Dr. Kathyrn Sheriff. Post op, patient developed right sided weakness with somnolence as well as elevated BP requiring Cleviprex for BP control and Nimotop to manage vasospasm. CT head showed resolution of hydrocephalus and repeat CT 11/25 revealed left ACA territory hypo attenuationconsistent with early subacute ischemia.   She was treated with IV antibioticsdue to HCAPfrom Moraxella catarrhalis. She tolerated extubation without difficulty and is tolerating regular textures. She continues onNimotopthorough 12/24. Verbal output and activity tolerance improving and CIR recommended due to functional deficits. Patient transferred to CIR on 05/27/2018 .   Patient currently requires total with mobility secondary to muscle weakness and muscle paralysis, decreased cardiorespiratoy endurance, impaired timing and sequencing, abnormal tone, unbalanced muscle activation, decreased coordination and decreased motor planning, decreased visual acuity, decreased midline orientation and decreased motor planning and decreased sitting balance, decreased standing balance, decreased postural control, hemiplegia and decreased balance strategies.  Prior to hospitalization, patient was independent  with mobility and lived with (two young children) in  a  Apartment home.  Home access is 3Stairs to enter.  Patient will benefit from skilled PT intervention to maximize safe functional mobility, minimize fall risk and decrease caregiver burden for planned discharge to SNF.  Anticipate patient will benefit from SNF PT at discharge.  PT - End of Session Activity Tolerance: Tolerates 10 - 20 min activity with multiple rests Endurance Deficit: Yes Endurance Deficit Description: fatigues quickly with mobility activities PT Assessment Rehab Potential (ACUTE/IP ONLY): Fair PT Barriers to Discharge: Decreased caregiver support PT Patient demonstrates impairments in the following area(s): Balance;Sensory;Skin Integrity;Endurance;Motor;Perception;Safety PT Transfers Functional Problem(s): Bed Mobility;Bed to Chair;Car PT Locomotion Functional Problem(s): Ambulation;Wheelchair Mobility PT Plan PT Intensity: Minimum of 1-2 x/day ,45 to 90 minutes PT Frequency: 5 out of 7 days PT Duration Estimated Length of Stay: 21-28 days PT Treatment/Interventions: Ambulation/gait training;Discharge planning;Functional mobility training;Psychosocial support;Therapeutic Activities;Visual/perceptual remediation/compensation;Wheelchair propulsion/positioning;Therapeutic Exercise;Skin care/wound management;Neuromuscular re-education;Disease management/prevention;Balance/vestibular training;Cognitive remediation/compensation;DME/adaptive equipment instruction;Pain management;Splinting/orthotics;UE/LE Strength taining/ROM;UE/LE Coordination activities;Stair training;Patient/family education;Functional electrical stimulation;Community reintegration PT Transfers Anticipated Outcome(s): minA PT Locomotion Anticipated Outcome(s): minA PT Recommendation Follow Up Recommendations: Skilled nursing facility Patient destination: [facility] (SNF)  Skilled Therapeutic Intervention Pt received in bed, denies pain and agreeable to treatment. PT initial evaluation performed as  below with maxA overall, +2 utilized for sit <>stand in stedy, three muskateers and w/c follow during gait at L hall rail. Educated pt in rehab process, estimated LOS, goals to be determined once evaluations completed. Pt appears agreeable to the above; minimal verbalizations with therapist throughout session despite encouragement. Remained seated at nurses station with NT setting up breakfast tray, all needs in reach.   PT Evaluation Precautions/Restrictions Precautions Precautions: Fall Precaution Comments: right hemiplegia, pushes right Restrictions Weight Bearing Restrictions: No General Chart Reviewed: Yes Response to Previous Treatment: Not applicable Family/Caregiver Present: No  Pain Pain Assessment Pain Scale: 0-10 Pain Score: 0-No pain Home Living/Prior Functioning Home Living Available Help at Discharge: Other (Comment)(d/c to SNF d/t lack of family support) Type of Home: Apartment Home Access: Stairs to enter Entrance Stairs-Number of Steps: 3 Entrance Stairs-Rails: None Home Layout: Two level Alternate Level Stairs-Number of Steps: flight Additional Comments: information from chart review; no family available to clarify  Lives With: (two young children) Prior Function Level of Independence: Independent with transfers;Independent with basic ADLs;Independent with gait;Independent with homemaking with ambulation  Able to Take Stairs?: Yes Driving: Yes Comments: 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; disabled due to vision Vision/Perception  Vision - Assessment Additional Comments: not formally assessed d/t communication deficits  Cognition Overall Cognitive Status: Impaired/Different from baseline Arousal/Alertness: Lethargic Orientation Level: Oriented X4 Attention: Sustained Sustained Attention: Impaired Sustained Attention Impairment: Verbal basic;Functional basic Memory: (suspect impaired, difficult to fully assess due to limited verbal  output) Awareness: Impaired(suspect impaired, difficult to assess due to limited verbal output) Problem Solving: Impaired Problem Solving Impairment: Verbal basic;Functional basic Executive Function: Initiating Initiating: Impaired Initiating Impairment: Verbal basic;Functional basic Safety/Judgment: Impaired Sensation Sensation Light Touch: Appears Intact(pt verbalizes R side intact, however not formally assessed d/t communication/attention deficits) Coordination Gross Motor Movements are Fluid and Coordinated: No Heel Shin Test: unable RLE Motor  Motor Motor: Hemiplegia;Motor impersistence;Abnormal postural alignment and control  Mobility Bed Mobility Bed Mobility: Supine to Sit Supine to Sit: Moderate Assistance - Patient 50-74% Transfers Transfers: Sit to [employer];Lateral/Scoot Transfers Sit to Stand: 2 Dance movement psychotherapist Transfers: Moderate Assistance - Patient 50-74% Lateral/Scoot Transfers: Maximal Assistance - Patient 25-49%(maxA to R, minA to L) Transfer via Lift Equipment: Stedy(modA +  2) Locomotion  Gait Ambulation: Yes Gait Assistance: 2 Helpers(maxA, w/c follow) Gait Distance (Feet): 15 Feet Assistive device: Other (Comment)(hall rail) Gait Assistance Details: Verbal cues for precautions/safety;Verbal cues for technique;Tactile cues for weight shifting;Tactile cues for weight beaing;Tactile cues for placement;Tactile cues for sequencing;Manual facilitation for weight shifting;Manual facilitation for weight bearing;Manual facilitation for placement Gait Gait: Yes Gait Pattern: Impaired Gait Pattern: Right foot flat(flaccid RLE; totalA for stance control and RLE progression) Stairs / Additional Locomotion Stairs: No Wheelchair Mobility Wheelchair Mobility: No  Trunk/Postural Assessment  Cervical Assessment Cervical Assessment: Within Functional Limits Thoracic Assessment Thoracic Assessment: Within Functional Limits Lumbar  Assessment Lumbar Assessment: Within Functional Limits Postural Control Postural Control: Deficits on evaluation(delayed/inefficient righting reactions in sitting)  Balance Balance Balance Assessed: Yes Static Sitting Balance Static Sitting - Balance Support: No upper extremity supported;Feet supported Static Sitting - Level of Assistance: 3: Mod assist;2: Max assist Static Sitting - Comment/# of Minutes: maxA initially, faded to minA once midline achieved and cues to maintain; question effort/attention to task Dynamic Sitting Balance Dynamic Sitting - Balance Support: During functional activity;Feet supported;Left upper extremity supported Dynamic Sitting - Level of Assistance: 1: +1 Total assist Static Standing Balance Static Standing - Balance Support: Left upper extremity supported Static Standing - Level of Assistance: 2: Max assist Dynamic Standing Balance Dynamic Standing - Balance Support: During functional activity;Left upper extremity supported Dynamic Standing - Level of Assistance: 2: Max assist Extremity Assessment      RLE Assessment RLE Assessment: Exceptions to [facility] Passive Range of Motion (PROM) Comments: WFL Active Range of Motion (AROM) Comments: 0/5 throughout; no volitional movement noted during MMT testing or functional activities LLE Assessment LLE Assessment: Within Functional Limits    Refer to Care Plan for Long Term Goals  Recommendations for other services: None   Discharge Criteria: Patient will be discharged from PT if patient refuses treatment 3 consecutive times without medical reason, if treatment goals not met, if there is a change in medical status, if patient makes no progress towards goals or if patient is discharged from hospital.  The above assessment, treatment plan, treatment alternatives and goals were discussed and mutually agreed upon: by patient  Corliss Skains 05/28/2018, 12:53 PM

## 2018-05-28 NOTE — Progress Notes (Addendum)
Patient positive for DVT in right peroneal and posterior tibial veins. Asymptomatic but is her affected side--will monitor with repeat dopplers in a week. Patient and mother updated.

## 2018-05-28 NOTE — Plan of Care (Signed)
  Problem: RH BOWEL ELIMINATION Goal: RH STG MANAGE BOWEL WITH ASSISTANCE Description STG Manage Bowel with Assistance. max  Outcome: Progressing  Continue to offer bathroom needs to patient  Problem: RH BLADDER ELIMINATION Goal: RH STG MANAGE BLADDER WITH ASSISTANCE Description STG Manage Bladder With Assistance. Max  Outcome: Progressing  Continue to offer bathroom needs to patient  Problem: RH SAFETY Goal: RH STG ADHERE TO SAFETY PRECAUTIONS W/ASSISTANCE/DEVICE Description STG Adhere to Safety Precautions With Assistance/Device. max  Outcome: Progressing  Call light within reach, proper footwear, continue to educate and encourage pt to use call light for assistance, impaired vision keep area well-lighted

## 2018-05-28 NOTE — Progress Notes (Signed)
*  Preliminary Results* Bilateral lower extremity venous duplex completed. Right lower extremity is positive for deep vein thrombosis.   Preliminary results discussed with Elna Breslow, RN.  Refer to "CV Proc" under chart review to view full preliminary results.  05/28/2018 4:12 PM Maudry Mayhew, MHA, RVT, RDCS, RDMS

## 2018-05-28 NOTE — Progress Notes (Signed)
Social Work  Social Work Assessment and Plan  Patient Details  Name: [name] [name] MRN: [id number] Date of Birth: 19-Nov-1985  Today's Date: 05/28/2018  Problem List:  Patient Active Problem List   Diagnosis Date Noted  . Nonverbal   . Essential hypertension   . Hyperglycemia   . SAH (subarachnoid hemorrhage) ([place])   . Acute cerebral infarction ([place])   . HCAP (healthcare-associated pneumonia)   . Benign essential HTN   . Acute blood loss anemia   . Right hemiplegia ([place])   . 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 Endotracheal tube present   . Brain aneurysm   . Subarachnoid hemorrhage ([place]) 05/09/2018  . Gastroesophageal reflux disease without esophagitis 10/09/2016  . BMI 31.0-31.9,adult 07/20/2016  . Obesity in pregnancy, antepartum 07/20/2016  . Tobacco abuse 06/01/2015  . Marijuana use 06/23/2014   Past Medical History:  Past Medical History:  Diagnosis Date  . Asthma, allergic    uses inhaler prn - infrequently  . Bilateral thoracic back pain 09/13/2015  . Eczema   . Hypertension   . Lactose intolerance   . Mature cystic teratoma of ovary   . Mood swings 06/01/2015  . Seasonal allergies    Past Surgical History:  Past Surgical History:  Procedure Laterality Date  . CRANIOTOMY N/A 05/10/2018   Procedure: 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;  Surgeon: Consuella Lose, MD;  Location: [place];  Service: Neurosurgery;  Laterality: N/A;  . RADIOLOGY WITH ANESTHESIA N/A 05/10/2018   Procedure: EMBOLIZATION OF ANEURSYM;  Surgeon: Consuella Lose, MD;  Location: [place];  Service: Radiology;  Laterality: N/A;  . TUBAL LIGATION N/A 12/15/2016   Procedure: POST PARTUM TUBAL LIGATION;  Surgeon: Mora Bellman, MD;  Location: [facility];  Service: Gynecology;  Laterality: N/A;  . WISDOM TOOTH EXTRACTION     Social History:  reports that she has been smoking cigarettes. She has been smoking  about 0.25 packs per day. She has quit using smokeless tobacco. She reports that she does not drink alcohol or use drugs.  Family / Support Systems Marital Status: Single Patient Roles: Parent Children: Pt has a 68 and 27 month old little girls-Chase and Gowen Other Supports: Kallie Locks [phone]-cell  Janene Harvey [phone] Anticipated Caregiver: SNF rehab Ability/Limitations of Caregiver: Mom and aunt work and grandmother is caring for pt's children Caregiver Availability: Other (Comment)(Pt will need NHP due to doesn't have 24 hr care) Family Dynamics: Close knit family who care for one another and will do for each other. Mom and aunt want to assist but both work and grandmother has her hands full with pt's two little girls. Mom and Aunt take care of them on the weekends. Pt has a few friends who are supportive.  Social History Preferred language: English Religion: Christian Cultural Background: No issues Education: High School Read: Yes Write: Yes Employment Status: Disabled Date Retired/Disabled/Unemployed: Due to vision issues Public relations account executive Issues: No issues Guardian/Conservator: None-according to MD pt is not fully capable of making her own decisions at this time, will look toward her Mom since she is her next of kin to make any decisions while here   Abuse/Neglect Abuse/Neglect Assessment Can Be Completed: Yes(Pt uses yes/no and nods) Physical Abuse: Denies Verbal Abuse: Denies Sexual Abuse: Denies Exploitation of patient/patient's resources: Denies Self-Neglect: Denies  Emotional Status Pt's affect, behavior and adjustment status: Pt is motivated to do well and has just started to make progress in her therapies. Family is  so glad she is here on rehab now to get the opportunity she needs to progress. Pt has always been independent according to Mom and taken care of herself and her children.  This is her goal again to be able to take care of  her children. Recent Psychosocial Issues: other health issues-needs a corneal transplant Psychiatric History: No history deferred until pt is able to communicate more and able to participate in neuro-psych which she would benefit from after this life alternating event. Will await input from team regarding this Substance Abuse History: No issues-was down to 4 cigarettes per day and was planning on quitting  Patient / Family Perceptions, Expectations & Goals Pt/Family understanding of illness & functional limitations: Mom can explain her daughter's anneurysm and deficits. She has spoken with the MD's and feels she has a good understanding of her daughter's treatment plan and goals while here. She does want to be kept updated on her progress. Premorbid pt/family roles/activities: Mom, daughter, granddaughter, niece, friend, etc Anticipated changes in roles/activities/participation: resume Pt/family expectations/goals: Pt nods and smile sbut does not really comunicate. Mom states: " I hope she does well here and makes good progress here, she will work hard to get back to her kids."  [employer]: None Premorbid Home Care/DME Agencies: None Transportation available at discharge: [employer] referrals recommended: Neuropsychology, Support group (specify)  Discharge Planning Living Arrangements: Children Support Systems: Children, Armed forces technical officer, Other relatives, Friends/neighbors Type of Residence: Private residence Insurance Resources: [employer] (specify county) Museum/gallery curator Resources: [employer] Financial Screen Referred: Previously completed Living Expenses: Education officer, community Management: Patient Does the patient have any problems obtaining your medications?: No Home Management: Self Patient/Family Preliminary Plans: Current plan is to go to a NH from here to continue her rheab then hopefully home. She does not have a caregiver who can provide 24 hr care due to Mom and aunt work and  grandmother is taking care of pt's small daughter's. Will see how pt does while here and work on the best plan for pt.  Sw Barriers to Discharge: Decreased caregiver support Sw Barriers to Discharge Comments: Does not have 24 hr care plan to go to a NH from here Social Work Anticipated Follow Up Needs: SNF, Support Group  Clinical Impression Pt seems like she is starting to progress and she is in the right place for that. Her daughter's came for the firs time today and visited her. Her younger daughter was somewhat upset and didn't understand. Her 31 year old daughter did well and was glad to see her Mom. Will work on discharge plans and work with Mom until pt is able to communicate better. Do feels she would benefit from seeing neuro-psych when appropriate.  Elease Hashimoto 05/28/2018, 2:56 PM

## 2018-05-28 NOTE — Care Management Note (Signed)
[facility] Individual Statement of Services  Patient Name:  [name] [name]  Date:  05/28/2018  Welcome to the [facility].  Our goal is to provide you with an individualized program based on your diagnosis and situation, designed to meet your specific needs.  With this comprehensive rehabilitation program, you will be expected to participate in at least 3 hours of rehabilitation therapies Monday-Friday, with modified therapy programming on the weekends.  Your rehabilitation program will include the following services:  Physical Therapy (PT), Occupational Therapy (OT), Speech Therapy (ST), 24 hour per day rehabilitation nursing, Therapeutic Recreaction (TR), Neuropsychology, Case Management (Social Worker), Rehabilitation Medicine, Nutrition Services and Pharmacy Services  Weekly team conferences will be held on Wednesday to discuss your progress.  Your Social Worker will talk with you frequently to get your input and to update you on team discussions.  Team conferences with you and your family in attendance may also be held.  Expected length of stay: 21-28 days Overall anticipated outcome: min overall  Depending on your progress and recovery, your program may change. Your Social Worker will coordinate services and will keep you informed of any changes. Your Social Worker's name and contact numbers are listed  below.  The following services may also be recommended but are not provided by the [facility] will be made to provide these services after discharge if needed.  Arrangements include referral to agencies that provide these services.  Your insurance has been verified to be:  Medicaid Your primary doctor is:  Psychologist, educational  Pertinent information will be shared with your doctor and your insurance  company.  Social Worker:  Ovidio Kin, [place] or ([phone]  Information discussed with and copy given to patient by: Elease Hashimoto, 05/28/2018, 1:10 PM

## 2018-05-28 NOTE — Evaluation (Signed)
Occupational Therapy Assessment and Plan  Patient Details  Name: [name] [name] MRN: [id number] Date of Birth: 15-Jul-1985  OT Diagnosis: abnormal posture, cognitive deficits, flaccid hemiplegia and hemiparesis, hemiplegia affecting dominant side and muscle weakness (generalized) Rehab Potential: Rehab Potential (ACUTE ONLY): Fair ELOS: 21-28 days   Today's Date: 05/28/2018 OT Individual Time: 1345-1500 OT Individual Time Calculation (min): 75 min     Problem List:  Patient Active Problem List   Diagnosis Date Noted  . Nonverbal   . Essential hypertension   . Hyperglycemia   . SAH (subarachnoid hemorrhage) ([place])   . Acute cerebral infarction ([place])   . HCAP (healthcare-associated pneumonia)   . Benign essential HTN   . Acute blood loss anemia   . Right hemiplegia ([place])   . 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 Endotracheal tube present   . Brain aneurysm   . Subarachnoid hemorrhage ([place]) 05/09/2018  . Gastroesophageal reflux disease without esophagitis 10/09/2016  . BMI 31.0-31.9,adult 07/20/2016  . Obesity in pregnancy, antepartum 07/20/2016  . Tobacco abuse 06/01/2015  . Marijuana use 06/23/2014    Past Medical History:  Past Medical History:  Diagnosis Date  . Asthma, allergic    uses inhaler prn - infrequently  . Bilateral thoracic back pain 09/13/2015  . Eczema   . Hypertension   . Lactose intolerance   . Mature cystic teratoma of ovary   . Mood swings 06/01/2015  . Seasonal allergies    Past Surgical History:  Past Surgical History:  Procedure Laterality Date  . CRANIOTOMY N/A 05/10/2018   Procedure: 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;  Surgeon: Consuella Lose, MD;  Location: [facility];  Service: Neurosurgery;  Laterality: N/A;  . IR 3D INDEPENDENT WKST  05/10/2018  . IR ANGIO INTRA EXTRACRAN SEL INTERNAL CAROTID BILAT MOD SED  05/10/2018  . IR ANGIO VERTEBRAL SEL VERTEBRAL  BILAT MOD SED  05/10/2018  . RADIOLOGY WITH ANESTHESIA N/A 05/10/2018   Procedure: EMBOLIZATION OF ANEURSYM;  Surgeon: Consuella Lose, MD;  Location: [facility];  Service: Radiology;  Laterality: N/A;  . TUBAL LIGATION N/A 12/15/2016   Procedure: POST PARTUM TUBAL LIGATION;  Surgeon: Mora Bellman, MD;  Location: [facility];  Service: Gynecology;  Laterality: N/A;  . WISDOM TOOTH EXTRACTION      Assessment & Plan Clinical Impression: [name] [name] is a 32 year old female with history of HTN, eczema, chronic allergic rhinitis, visual deficits; who was admitted via ED on 05/09/2018 with bilateral neck pain, vomiting and tightness radiating down her back to her buttocks.  History taken from chart review.  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 Cerebral angiogram with attempts at coiling not ameable therefore patient underwent Left pterional crani with aneurysm clipping by Dr. Kathyrn Sheriff. Post op, patient developed right sided weakness with somnolence as well as elevated BP requiring Cleviprex for BP control and Nimotop to manage vasospasm. CT head showed resolution of hydrocephalus and repeat CT 11/25 reviewed, showing left ACA infarct.  Per report, left ACA territory hypoattenuation consistent with early subacute ischemia.   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 She tolerated extubation without difficulty and is tolerating regular textures. She continues on nimotop thorough 12/24. Verbal output and activity tolerance improving and CIR recommended due to functional deficits.  Patient transferred to CIR on 05/27/2018 .    Patient currently requires total +2 with basic  self-care skills secondary to muscle weakness and muscle paralysis, decreased cardiorespiratoy endurance, abnormal tone and decreased coordination, decreased visual acuity, decreased attention to right, decreased initiation, decreased  attention, decreased awareness, decreased problem solving, decreased safety awareness, decreased memory and delayed processing and decreased sitting balance, decreased standing balance, decreased postural control, hemiplegia and decreased balance strategies.  Prior to hospitalization, patient could complete ADLs and IADLs with independent .  Patient will benefit from skilled intervention to decrease level of assist with basic self-care skills and increase independence with basic self-care skills prior to discharge. Pre-admission plan to reduce burden of care while on IPR and to d/c to SNF in order to cont to gain independence with ADLs and reduce caregiver burden.   OT - End of Session Activity Tolerance: Tolerates 10 - 20 min activity with multiple rests Endurance Deficit: Yes Endurance Deficit Description: Requires rest breaks throughout seated ADL tasks and increased assist levels throughout session required 2/2 fatigue OT Assessment Rehab Potential (ACUTE ONLY): Fair OT Barriers to Discharge: Decreased caregiver support;Home environment access/layout;Lack of/limited family support;Incontinence OT Barriers to Discharge Comments: Plans to d/c to SNF OT Patient demonstrates impairments in the following area(s): Balance;Pain;Behavior;Perception;Cognition;Safety;Sensory;Endurance;Skin Integrity;Motor;Vision OT Basic ADL's Functional Problem(s): Eating;Grooming;Bathing;Dressing;Toileting OT Transfers Functional Problem(s): Toilet OT Additional Impairment(s): Fuctional Use of Upper Extremity OT Plan OT Intensity: Minimum of 1-2 x/day, 45 to 90 minutes OT Frequency: 5 out of 7 days OT Duration/Estimated Length of Stay: 21-28 days OT Treatment/Interventions: Balance/vestibular training;Functional electrical stimulation;Pain management;Self Care/advanced ADL retraining;Therapeutic Activities;UE/LE Coordination activities;Therapeutic Exercise;Skin care/wound managment;Patient/family education;Functional  mobility training;Cognitive remediation/compensation;Disease mangement/prevention;DME/adaptive equipment instruction;Neuromuscular re-education;Psychosocial support;Splinting/orthotics;UE/LE Strength taining/ROM;Wheelchair propulsion/positioning OT Self Feeding Anticipated Outcome(s): Set-up/supervision OT Basic Self-Care Anticipated Outcome(s): Min A OT Toileting Anticipated Outcome(s): Min A OT Bathroom Transfers Anticipated Outcome(s): Min A OT Recommendation Recommendations for Other Services: Therapeutic Recreation consult;Neuropsych consult Therapeutic Recreation Interventions: Stress management Patient destination: [facility] (SNF) Follow Up Recommendations: Skilled nursing facility Equipment Recommended: To be determined   Skilled Therapeutic Intervention Pt seen for OT eval and ADL bathing/dressing session.  Pt in supine upon arrival. Throughout session, pt able to verbalize at short phrase level. She denied pain at rest, however, wincing at times with total A movement of R LE. RN aware. Pt and bed linens soiled upon arrival, total A +2 for hygiene. Pt able to assist with pericare from supine position. Min A rolling to R, max-total A rolling to L. STEDY used for transfer EOB>BSC in shower> w/c. Pt required max A +2 to stand into STEDY. She bathed seated on BSC, able to follow basic commands for bathing with increased time, max A overall for bathing. She returned to w/c to dress, max A to don shirt and total A +2 to don pants. Grooming tasks completed from w/c level at sink with assist for set-up and VCs for sequencing/technique. Trialed use of SARA PLUS lift, pt able to complete sit>Stand with use of lift and +1 assist and maintain midline standing once supported in lift. Pt left seated in w/c at end of session, chair belt alarm donned and all needs in reach.  Throughout session, education provided regarding role of OT, POC, hemi techniques, and d/c planning.   OT  Evaluation Precautions/Restrictions  Precautions Precautions: Fall Precaution Comments: right hemiplegia, pushes right Restrictions Weight Bearing Restrictions: No General Chart Reviewed: Yes Additional Pertinent History: hx of visual deficits, legally blind Pain Pain Assessment Pain Scale: 0-10 Pain Score: 0-No pain Home Living/Prior Functioning Home Living Family/patient expects to be discharged to:: Private  residence Living Arrangements: Children Available Help at Discharge: [place] Type of Home: Apartment Home Access: Stairs to enter Technical brewer of Steps: 3 Entrance Stairs-Rails: None Home Layout: Two level Alternate Level Stairs-Number of Steps: flight Bathroom Shower/Tub: Tub/shower unit, Air cabin crew Accessibility: No Additional Comments: information from chart review; no family available to clarify  Lives With: Other (Comment)(Two young children) IADL History Homemaking Responsibilities: Yes Meal Prep Responsibility: Primary Laundry Responsibility: Primary Cleaning Responsibility: Primary Child Care Responsibility: Primary Current License: No Occupation: On disability Prior Function Level of Independence: Independent with transfers, Independent with basic ADLs, Independent with gait, Independent with homemaking with ambulation  Able to Take Stairs?: Yes Driving: No Vocation: On disability Comments: 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; disabled due to vision Vision Baseline Vision/History: Legally blind Patient Visual Report: No change from baseline(Pt reports no change from baseline deficits) Additional Comments: Cont to asssess in functional context; unable to formally assess 2/2 cognitive deficits Perception  Perception: Impaired Comments: R inattention Praxis Praxis: Impaired Praxis Impairment Details: Initiation Cognition Overall Cognitive Status: Impaired/Different from  baseline Arousal/Alertness: Awake/alert Orientation Level: Person;Place;Situation Person: Oriented Place: ("[place]" independently. "Hospital" when given options) Situation: Oriented Year: 2019 Month: January Day of Week: Correct Memory: Impaired Memory Impairment: Decreased short term memory Decreased Short Term Memory: Verbal basic;Functional basic Immediate Memory Recall: Sock;Blue;Bed Memory Recall: Blue;Bed Memory Recall Blue: With Cue Memory Recall Bed: With Cue Attention: Sustained Sustained Attention: Impaired Sustained Attention Impairment: Verbal basic;Functional basic Awareness: Impaired(Suspect impaired; difficult to assess 2/2 limited verbal output) Problem Solving: Impaired Problem Solving Impairment: Verbal basic;Functional basic Executive Function: Initiating Initiating: Impaired Initiating Impairment: Verbal basic;Functional basic Safety/Judgment: Impaired Comments: Decreased awareness of deficits and functional implications Sensation Sensation Light Touch: Impaired Detail Light Touch Impaired Details: Absent RUE;Absent RLE Stereognosis: Impaired Detail Stereognosis Impaired Details: Impaired RUE;Absent RLE Coordination Gross Motor Movements are Fluid and Coordinated: No Fine Motor Movements are Fluid and Coordinated: No Coordination and Movement Description: Dense R hemiplegia Heel Shin Test: unable RLE Motor  Motor Motor: Hemiplegia;Motor impersistence;Abnormal postural alignment and control Trunk/Postural Assessment  Cervical Assessment Cervical Assessment: Within Functional Limits Thoracic Assessment Thoracic Assessment: Within Functional Limits Lumbar Assessment Lumbar Assessment: Within Functional Limits Postural Control Postural Control: Deficits on evaluation(Delayed-absent righting reactions in sitting, Pushing tendencies towards R)  Balance Balance Balance Assessed: Yes Static Sitting Balance Static Sitting - Balance Support: Feet  supported;Right upper extremity supported Static Sitting - Level of Assistance: 4: Min assist;3: Mod assist Static Sitting - Comment/# of Minutes: Sitting EOB, max A when pt using L UE to push towards R. When L hand placed to be holding onto bed rail able to sit with close supervision Dynamic Sitting Balance Dynamic Sitting - Balance Support: During functional activity;Feet supported;Left upper extremity supported;Right upper extremity supported Dynamic Sitting - Level of Assistance: 2: Max assist Sitting balance - Comments: Sitting on BSC and in w/c to complete bathing/dressing tasks Static Standing Balance Static Standing - Balance Support: During functional activity;Right upper extremity supported;Left upper extremity supported Static Standing - Level of Assistance: 1: +2 Total assist Dynamic Standing Balance Dynamic Standing - Balance Support: During functional activity;Left upper extremity supported Dynamic Standing - Level of Assistance: 1: +2 Total assist Dynamic Standing - Comments: Standing in STEDY during LB dressing tasks Extremity/Trunk Assessment RUE Assessment RUE Assessment: Exceptions to [facility] General Strength Comments: Observe 0/5, however unable to fomally assess 2/2 cognitive deficits and R inattention RUE Body System: Neuro Brunstrum levels for arm and  hand: Hand;Arm Brunstrum level for arm: Stage I Presynergy Brunstrum level for hand: Stage I Flaccidity LUE Assessment LUE Assessment: Within Functional Limits     Refer to Care Plan for Long Term Goals  Recommendations for other services: Neuropsych and Therapeutic Recreation  Stress management   Discharge Criteria: Patient will be discharged from OT if patient refuses treatment 3 consecutive times without medical reason, if treatment goals not met, if there is a change in medical status, if patient makes no progress towards goals or if patient is discharged from hospital.  The above assessment, treatment plan,  treatment alternatives and goals were discussed and mutually agreed upon: by patient  Rounds, Amy L 05/28/2018, 5:25 PM

## 2018-05-29 ENCOUNTER — Inpatient Hospital Stay (HOSPITAL_COMMUNITY): Payer: Medicaid Other | Admitting: Physical Therapy

## 2018-05-29 ENCOUNTER — Inpatient Hospital Stay (HOSPITAL_COMMUNITY): Payer: Medicaid Other | Admitting: Occupational Therapy

## 2018-05-29 ENCOUNTER — Inpatient Hospital Stay (HOSPITAL_COMMUNITY): Payer: Medicaid Other | Admitting: Speech Pathology

## 2018-05-29 DIAGNOSIS — I69051 Hemiplegia and hemiparesis following nontraumatic subarachnoid hemorrhage affecting right dominant side: Principal | ICD-10-CM

## 2018-05-29 DIAGNOSIS — I69319 Unspecified symptoms and signs involving cognitive functions following cerebral infarction: Secondary | ICD-10-CM

## 2018-05-29 MED ORDER — POTASSIUM CHLORIDE CRYS ER 10 MEQ PO TBCR
10.0000 meq | EXTENDED_RELEASE_TABLET | Freq: Every day | ORAL | Status: DC
  Administered 2018-05-29 – 2018-06-24 (×27): 10 meq via ORAL
  Filled 2018-05-29 (×27): qty 1

## 2018-05-29 NOTE — Progress Notes (Signed)
Occupational Therapy Session Note  Patient Details  Name: [name] [name] MRN: [id number] Date of Birth: 01/19/1986  Today's Date: 05/29/2018 OT Individual Time: 2952-8413 OT Individual Time Calculation (min): 60 min    Short Term Goals: Week 1:  OT Short Term Goal 1 (Week 1): Pt will consistently complete basic transfers with +1 assist in order to reduce caregiver burden OT Short Term Goal 2 (Week 1): Pt will recall hemi-dressing technique during UB dressing with min cuing OT Short Term Goal 3 (Week 1): Pt will complete sit>stand at sink with +1 assist during LB dressing task in order to reduce caregiver burden  Skilled Therapeutic Interventions/Progress Updates:    Pt seen for OT session focusing on ADL r-etraining, functional sitting/standing balance, and functional transfers. Pt in supine upon arrival, smiling and agreeable to tx session. She verbalized not being in pain. Pt noted to be soiled, total A to don new brief and complete hygiene, total A to roll to L and VCs for rolling to R. Pt able to assist with peri-area hygiene and came into partial bridge to assist with clothing management. She transferred to sitting EOB with max A +1. Pt able to maintain static and dynamic sitting balance EOB with overall close supervision, mod cuing for midline orientation due to R lean, pt able to correct with min A. Donned shirt and pants seated EOB, mod A for shirt with VCs and set-up for hemi technique. Pt placed in figure four sitting position and able to thread LEs into pants and initiate anterior lean. She stood with +2 assist from EOB, able to initiate and assist with powering into standing, trace glute activation with multi-modal cuing.  Completed x2 EOB<> w/c. Mod A +1 transferring to L with +2 for safety and max A +2 transferring to L. Pt able to follow cues for transfer technique.  Pt ate breakfast seated in w/c, with increased time able to self feed meal following set-up. Able to scan  environment to locate all needed items. Pt left seated in w/c with all needs in reach, chair belt alarm on.   Therapy Documentation Precautions:  Precautions Precautions: Fall Precaution Comments: right hemiplegia, pushes right Restrictions Weight Bearing Restrictions: No Pain:   No/denies pain   Therapy/Group: Individual Therapy  Dillan Lunden L 05/29/2018, 7:10 AM

## 2018-05-29 NOTE — Patient Care Conference (Signed)
Inpatient RehabilitationTeam Conference and Plan of Care Update Date: 05/29/2018   Time: 10:25 AM    Patient Name: [name] [name]      Medical Record Number: [id number]  Date of Birth: 1986/02/18 Sex: Female         Room/Bed: 4M03C/4M03C-01 Payor Info: Payor: MEDICAID [place] / Plan: MEDICAID [place] ACCESS / Product Type: *No Product type* /    Admitting Diagnosis: [place] CVA  Admit Date/Time:  05/27/2018  3:36 PM Admission Comments: No comment available   Primary Diagnosis:  SAH (subarachnoid hemorrhage) (HCC) Principal Problem: SAH (subarachnoid hemorrhage) ([place])  Patient Active Problem List   Diagnosis Date Noted  . Nonverbal   . Essential hypertension   . Hyperglycemia   . SAH (subarachnoid hemorrhage) ([place])   . Acute cerebral infarction ([place])   . HCAP (healthcare-associated pneumonia)   . Benign essential HTN   . Acute blood loss anemia   . Right hemiplegia ([place])   . 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 Endotracheal tube present   . Brain aneurysm   . Subarachnoid hemorrhage ([place]) 05/09/2018  . Gastroesophageal reflux disease without esophagitis 10/09/2016  . BMI 31.0-31.9,adult 07/20/2016  . Obesity in pregnancy, antepartum 07/20/2016  . Tobacco abuse 06/01/2015  . Marijuana use 06/23/2014    Expected Discharge Date: Expected Discharge Date: 06/21/18  Team Members Present: Physician leading conference: Dr. Alysia Penna Social Worker Present: Ovidio Kin, LCSW Nurse Present: Rayetta Pigg, RN PT Present: Kennyth Lose, PT OT Present: Amy Rounds, OT SLP Present: Windell Moulding, SLP PPS Coordinator present : Daiva Nakayama, RN, CRRN;Melissa Gertie Fey     Current Status/Progress Goal Weekly Team Focus  Medical   Impulsivity, poor initiation for speech, blood pressure mildly labile  Reduce fall risk, maintain medical stability  Initiate rehabilitation program   Bowel/Bladder   Incontient of bowel/bladder; LBM 05/28/18  To be  continent of bowel/bladder with mod assist  Timed toileting q shift    Swallow/Nutrition/ Hydration             ADL's   +2 A LB bathing/dressing, functional transfers and toileting; mod A UB bathing/dressing; min A grooming from w/c level  Min A overall  Functional sitting balance, functional transfers, ADL re-training, R neuro re-ed   Mobility     eval pending        Communication   moderately severe deficits, can verbally communicate but needs increased time to respond   min assist   verbal expression of needs and wants   Safety/Cognition/ Behavioral Observations  moderately severe deficits, low level cognition   min assist   initiation and sustained attention    Pain   Denied pain  <2  Assess and treat pain q shift and as needed   Skin   MASD to butt and groin with barrere cream  Main skin integrity with min assist  Assess skin q shift and as needed      *See Care Plan and progress notes for long and short-term goals.     Barriers to Discharge  Current Status/Progress Possible Resolutions Date Resolved   Physician    Medical stability;Behavior     Poor safety awareness and impulsivity  Continue rehab program may need additional restraints      Nursing  Decreased caregiver support               PT  Decreased caregiver support  OT Decreased caregiver support;Home environment access/layout;Lack of/limited family support;Incontinence  Plans to d/c to SNF             SLP                SW Decreased caregiver support Does not have 24 hr care plan to go to a NH from here            Discharge Planning/Teaching Needs:  Plan to go to NH from here for more rehab before going home. Grandmother caring for her young daughter's and her Mom and aunt work during the day. Will benefit from being here.      Team Discussion:  Goals being set for min overall assist. R-UE and LE not much movement. Safety issues-impulsive and poor attention to her arm safety. Slow to  iniaitiation give time to answer. Therapy using stedy and plus 2 assist. Nursing working on continence.   Revisions to Treatment Plan:  DC 1/3    Continued Need for Acute Rehabilitation Level of Care: The patient requires daily medical management by a physician with specialized training in physical medicine and rehabilitation for the following conditions: Daily direction of a multidisciplinary physical rehabilitation program to ensure safe treatment while eliciting the highest outcome that is of practical value to the patient.: Yes Daily medical management of patient stability for increased activity during participation in an intensive rehabilitation regime.: Yes Daily analysis of laboratory values and/or radiology reports with any subsequent need for medication adjustment of medical intervention for : Neurological problems   I attest that I was present, lead the team conference, and concur with the assessment and plan of the team.   Elease Hashimoto 05/30/2018, 9:40 AM

## 2018-05-29 NOTE — Progress Notes (Addendum)
Physical Therapy Session Note  Patient Details  Name: [name] [name] MRN: [id number] Date of Birth: 20-Apr-1986  Today's Date: 05/29/2018 PT Individual Time: 1300-1415 PT Individual Time Calculation (min): 75 min   Short Term Goals: Week 1:  PT Short Term Goal 1 (Week 1): Pt will perform bed mobility minA PT Short Term Goal 2 (Week 1): Pt will perform transfers modA PT Short Term Goal 3 (Week 1): Pt will perform sit <>stand modA PT Short Term Goal 4 (Week 1): Pt will perform gait x25' maxA +1 PT Short Term Goal 5 (Week 1): Pt will initiate w/c propulsion  Skilled Therapeutic Interventions/Progress Updates: Pt received seated in w/c, denies pain and agreeable to treatment. Pt dons L shoe setupA, totalA for RLE with leg crossed over L knee. W/c propulsion with L hemi technique and minA x150', cues and hand-over-foot assist for problem solving technique of steering with foot, faded to S with repetition. Gait x2 triasl 25' each with maxA for RLE progression and stance control; improved trunk midline orientation and postural control compared to yesterday. Squat pivot w/c <>car with modA; cues for head/hips relationship and hand positioning/initiation. Squat pivot w/c >mat table modA to R side. R lateral weight shifting with weight bearing through R elbow while performing reaching/horseshoe toss with LUE. Sit >stand from mat table progressively lowered with modA faded to minA with repetition, and maintained minA as mat table lowered to height of w/c. Therapist required to maintain R foot placement when initiating stand, and cues for L weight shift when sitting d/t tendency to push with LUE towards R side during transition. Returned to room totalA; remained seated in w/c, alarm intact and all needs in reach.   Addendum to add: pt c/o pain in R calf during standing/gait during this session, suspect d/t DVT. Continue to monitor.      Therapy Documentation Precautions:  Precautions Precautions:  Fall Precaution Comments: right hemiplegia, pushes right Restrictions Weight Bearing Restrictions: No Pain: Pain Assessment Pain Scale: 0-10 Pain Score: 0-No pain    Therapy/Group: Individual Therapy  Corliss Skains 05/29/2018, 2:44 PM

## 2018-05-29 NOTE — Progress Notes (Signed)
Social Work Patient ID: [name] [name], female   DOB: 02-07-1986, 32 y.o.   MRN: [id number] [name] with Mom via telephone to discuss team conference goals min assist level and target discharge date 1/3. Mom still feels she may need SNF due to there is not an adult who can provide 24 hr care at discharge. She states: " We are hoping and praying she will do well here." Will work on discharge plans and touch base with Tammy-liaison regarding tentative discharge date. Will continue to work on plans.

## 2018-05-29 NOTE — Progress Notes (Signed)
[facility] PHYSICAL MEDICINE & REHABILITATION PROGRESS NOTE  Subjective/Complaints: Reviewed doppler results, no leg pain  ROS: Denies CP, SOB, N/V/D.  Objective: Vital Signs: Blood pressure 107/84, pulse 81, temperature 97.9 F (36.6 C), temperature source Oral, resp. rate 18, height 5\' 10"  (1.778 m), weight 89.3 kg, last menstrual period 05/03/2018, SpO2 99 %, currently breastfeeding. Vas [name] Lower Extremity Venous (dvt)  Result Date: 05/28/2018  Lower Venous Study Indications: Pain.  Performing Technologist: Maudry Mayhew MHA, RDMS, RVT, RDCS  Examination Guidelines: A complete evaluation includes B-mode imaging, spectral Doppler, color Doppler, and power Doppler as needed of all accessible portions of each vessel. Bilateral testing is considered an integral part of a complete examination. Limited examinations for reoccurring indications may be performed as noted.  Right Venous Findings: +---------+---------------+---------+-----------+----------+-------+          CompressibilityPhasicitySpontaneityPropertiesSummary +---------+---------------+---------+-----------+----------+-------+ CFV      Full           Yes      Yes                          +---------+---------------+---------+-----------+----------+-------+ SFJ      Full                                                 +---------+---------------+---------+-----------+----------+-------+ FV Prox  Full                                                 +---------+---------------+---------+-----------+----------+-------+ FV Mid   Full                                                 +---------+---------------+---------+-----------+----------+-------+ FV DistalFull                                                 +---------+---------------+---------+-----------+----------+-------+ PFV      Full                                                  +---------+---------------+---------+-----------+----------+-------+ POP      Full           Yes      Yes                          +---------+---------------+---------+-----------+----------+-------+ PTV      None                    No                   Acute   +---------+---------------+---------+-----------+----------+-------+ PERO     None                    No  Acute   +---------+---------------+---------+-----------+----------+-------+  Left Venous Findings: +---------+---------------+---------+-----------+----------+-------+          CompressibilityPhasicitySpontaneityPropertiesSummary +---------+---------------+---------+-----------+----------+-------+ CFV      Full           Yes      Yes                          +---------+---------------+---------+-----------+----------+-------+ SFJ      Full                                                 +---------+---------------+---------+-----------+----------+-------+ FV Prox  Full                                                 +---------+---------------+---------+-----------+----------+-------+ FV Mid   Full                                                 +---------+---------------+---------+-----------+----------+-------+ FV DistalFull                                                 +---------+---------------+---------+-----------+----------+-------+ PFV      Full                                                 +---------+---------------+---------+-----------+----------+-------+ POP      Full           Yes      Yes                          +---------+---------------+---------+-----------+----------+-------+ PTV      Full                                                 +---------+---------------+---------+-----------+----------+-------+ PERO     Full                                                  +---------+---------------+---------+-----------+----------+-------+    Summary: Right: Findings consistent with acute deep vein thrombosis involving the right posterior tibial vein, and right peroneal vein. No cystic structure found in the popliteal fossa. Left: There is no evidence of deep vein thrombosis in the lower extremity. No cystic structure found in the popliteal fossa.  *See table(s) above for measurements and observations. Electronically signed by Deitra Mayo MD on 05/28/2018 at 5:10:49 PM.    Final    Vas [name] Transcranial Doppler  Result Date: 05/28/2018  Transcranial Doppler Indications: Subarachnoid hemorrhage. Performing Technologist: Abram Sander RVS  Examination  Guidelines: A complete evaluation includes B-mode imaging, spectral Doppler, color Doppler, and power Doppler as needed of all accessible portions of each vessel. Bilateral testing is considered an integral part of a complete examination. Limited examinations for reoccurring indications may be performed as noted.  +----------+-------------+----------+-----------+-------+ RIGHT TCD Right VM (cm)Depth (cm)PulsatilityComment +----------+-------------+----------+-----------+-------+ MCA           72.00                 0.77            +----------+-------------+----------+-----------+-------+ ACA          -30.00                 0.70            +----------+-------------+----------+-----------+-------+ Term ICA      33.00                 1.05            +----------+-------------+----------+-----------+-------+ Opthalmic     21.00                 1.32            +----------+-------------+----------+-----------+-------+ ICA siphon    42.00                 0.64            +----------+-------------+----------+-----------+-------+ Distal ICA    27.00                 1.47            +----------+-------------+----------+-----------+-------+  +----------+------------+----------+-----------+-------+  LEFT TCD  Left VM (cm)Depth (cm)PulsatilityComment +----------+------------+----------+-----------+-------+ MCA          83.00                 0.85            +----------+------------+----------+-----------+-------+ Opthalmic    28.00                 1.42            +----------+------------+----------+-----------+-------+ ICA siphon   33.00                 1.43            +----------+------------+----------+-----------+-------+  +----------------------+----+ Right [place] Ratio2.66 +----------------------+----+  Summary:  Mildly elevated bilateral middle cerebral artery mean flow velocities of unclear significance. Posterior circulation was not evaluated due to poor occipital windows. No definite evidence of vasospasm noted. *See table(s) above for measurements and observations.  Diagnosing physician: Antony Contras MD Electronically signed by Antony Contras MD on 05/28/2018 at 12:44:43 PM.    Final    Recent Labs    05/28/18 0614  WBC 4.0  HGB 11.6*  HCT 36.9  PLT 245   Recent Labs    05/28/18 0614  NA 142  K 3.6  CL 105  CO2 25  GLUCOSE 101*  BUN 13  CREATININE 0.57  CALCIUM 9.7    Physical Exam: BP 107/84 (BP Location: Left Arm)   Pulse 81   Temp 97.9 F (36.6 C) (Oral)   Resp 18   Ht 5\' 10"  (1.778 m)   Wt 89.3 kg   LMP 05/03/2018   SpO2 99%   BMI 28.25 kg/m  Constitutional: She appears well-developed. NAD. Obese  HENT: Atraumatic. Left crani incision  Eyes: EOMI. No discharge.  Cardiovascular: Normal rate and regular rhythm. No JVD. Respiratory: Effort normal and breath sounds  normal.  GI: Bowel sounds are normal. She exhibits no distension. Musculoskeletal: No edema or tenderness in extremities  Neurological: She is alert.  Nonverbal, but appears to be responding appropriately.  Right upper extremity/right lower extremity: 0/5 proximal to distal.  Left upper extremity: 4/5 proximal distal Left lower extremity: 4-/5 proximal distal  Skin:  She is not diaphoretic. Crani incision c/d/i  Psychiatric: Her affect is blunt. Nonverbal  Speech without dysarthria , poor initiation of speech Assessment/Plan: 1. Functional deficits secondary to left ACA with SAH which require 3+ hours per day of interdisciplinary therapy in a comprehensive inpatient rehab setting.  Physiatrist is providing close team supervision and 24 hour management of active medical problems listed below.  Physiatrist and rehab team continue to assess barriers to discharge/monitor patient progress toward functional and medical goals  Care Tool:  Bathing    Body parts bathed by patient: Chest, Abdomen, Right arm, Right upper leg, Left upper leg, Face   Body parts bathed by helper: Left arm, Front perineal area, Buttocks, Right lower leg, Left lower leg     Bathing assist Assist Level: 2 Helpers     Upper Body Dressing/Undressing Upper body dressing   What is the patient wearing?: Pull over shirt    Upper body assist Assist Level: Maximal Assistance - Patient 25 - 49%    Lower Body Dressing/Undressing Lower body dressing      What is the patient wearing?: Incontinence brief, Pants     Lower body assist Assist for lower body dressing: 2 Helpers     Toileting Toileting Toileting Activity did not occur (Clothing management and hygiene only): Safety/medical concerns  Toileting assist Assist for toileting: Moderate Assistance - Patient 50 - 74%     Transfers Chair/bed transfer  Transfers assist  Chair/bed transfer activity did not occur: N/A  Chair/bed transfer assist level: 2 Helpers     Locomotion Ambulation   Ambulation assist      Assist level: 2 helpers Assistive device: Other (comment)(L hall rail) Max distance: 15   Walk 10 feet activity   Assist     Assist level: 2 helpers     Walk 50 feet activity   Assist           Walk 150 feet activity   Assist           Walk 10 feet on uneven surface   activity   Assist           Wheelchair     Assist               Wheelchair 50 feet with 2 turns activity    Assist            Wheelchair 150 feet activity     Assist            Medical Problem List and Plan: 1.  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   CIR PT, OT, SLP 2.  DVT Prophylaxis/Anticoagulation: Mechanical: Sequential compression devices, below knee Bilateral lower extremities Right peroneal and posterior tibial vein acute DVT- repeat dopplers in 1 wk- no anticoag due to [facility], pt is asymptomatic 3. Pain Management: Tylenol prn.  4. Mood: Team to provide ego support.  LCSW to follow for evaluation and support.  5. Neuropsych: This patient is not fully capable of making decisions on her own behalf. 6. Skin/Wound Care: Monitor incision for healing.  7. Fluids/Electrolytes/Nutrition: Monitor I/Os. Offer supplements  if intake poor.   BMP pending on 12/10 8. HTN: Monitor BP bid--continue amlodipine and nimotop.    Monitor with increased mobility 9. [place] HCAP: Has completed antibiotic regimen on 12/4 10. ABLA: Continue to monitor H/H serially.   Hb 11.6 on 12/10 11.  Impaired fasting glucose: Likely stress related.   HbA1c 4.3 on 12/10- normal 12.  HypoK - supplement KCL LOS: 2 days A FACE TO FACE EVALUATION WAS PERFORMED  Charlett Blake 05/29/2018, 7:20 AM

## 2018-05-29 NOTE — Progress Notes (Signed)
Speech Language Pathology Daily Session Note  Patient Details  Name: [name] [name] MRN: [id number] Date of Birth: [birth year]  Today's Date: 05/29/2018 SLP Individual Time: 1033-1130 SLP Individual Time Calculation (min): 57 min  Short Term Goals: Week 1: SLP Short Term Goal 1 (Week 1): Pt will initiate verbal response to question within 30 seconds in >25% of opportunities with mod assist multimodal cues.   SLP Short Term Goal 2 (Week 1): Pt will use compensatory strategies for mildly complex word finding during functional tasks with mod assist multimodal cues.   SLP Short Term Goal 3 (Week 1): Pt will sustain her attention to basic, familiar tasks for 5 minutes with mod assist multimodal cues for redirection.   SLP Short Term Goal 4 (Week 1): Pt will complete basic, familiar tasks with mod assist multimodal cues for functional problem solving.    Skilled Therapeutic Interventions:  Pt was seen for skilled ST targeting cognitive-linguistic goals.  Pt was sitting up in wheelchair upon arrival, much brighter affect in comparison to yesterday.  Pt smiled when therapist entered the room.  SLP utilized the Dyn[employer]ision to address task initiation and attention to task.  Pt was able to increase her target identification with each consecutive trial to the point where therapist could increase task complexity to include time constraints with pt able to achieve up to 68% accuracy and an average reaction time of <1 second with supervision.  SLP also facilitated the session with higher level word finding tasks for comparing and contrasting two items while under a time constraint.  Pt needed up to mod assist for word finding within task but her verbal initiation with task induced time constraints did seem more automatic today in comparison to yesterday.  Pt was returned to room and left in wheelchair with chair alarm set and call bell within reach.  Continue per current plan of care.    Pain Pain  Assessment Pain Scale: 0-10 Pain Score: 0-No pain  Therapy/Group: Individual Therapy  Terron Merfeld, Selinda Orion 05/29/2018, 11:41 AM

## 2018-05-30 ENCOUNTER — Inpatient Hospital Stay (HOSPITAL_COMMUNITY): Payer: Medicaid Other

## 2018-05-30 ENCOUNTER — Inpatient Hospital Stay (HOSPITAL_COMMUNITY): Payer: Medicaid Other | Admitting: Occupational Therapy

## 2018-05-30 ENCOUNTER — Inpatient Hospital Stay (HOSPITAL_COMMUNITY): Payer: Medicaid Other | Admitting: Speech Pathology

## 2018-05-30 NOTE — Progress Notes (Signed)
Physical Therapy Session Note  Patient Details  Name: [name] [name] MRN: [id number] Date of Birth: [birth year]  Today's Date: 05/30/2018   Short Term Goals: Week 1:  PT Short Term Goal 1 (Week 1): Pt will perform bed mobility minA PT Short Term Goal 2 (Week 1): Pt will perform transfers modA PT Short Term Goal 3 (Week 1): Pt will perform sit <>stand modA PT Short Term Goal 4 (Week 1): Pt will perform gait x25' maxA +1 PT Short Term Goal 5 (Week 1): Pt will initiate w/c propulsion  Skilled Therapeutic Interventions/Progress Updates: Pt missed 60 min PT following fall; RN care and then planned transport for xray. Therapist obtained tilt-in-space w/c and discussed with team plan to trial chair to reduce risk of falling while still allowing pt to benefit from being OOB during the day. Continue to monitor safety.     Therapy Documentation Precautions:  Precautions Precautions: Fall Precaution Comments: right hemiplegia, pushes right Restrictions Weight Bearing Restrictions: No General: PT Amount of Missed Time (min): 60 Minutes PT Missed Treatment Reason: Xray;Nursing care    Therapy/Group: Individual Therapy  Corliss Skains 05/30/2018, 9:21 AM

## 2018-05-30 NOTE — Progress Notes (Signed)
Occupational Therapy Session Note  Patient Details  Name: [name] [name] MRN: [id number] Date of Birth: [birth year]  Today's Date: 05/30/2018 OT Individual Time: 0730-0820 OT Individual Time Calculation (min): 50 min   and Today's Date: 05/30/2018 OT Missed Time: 10 Minutes Missed Time Reason: Other (comment)(Fall)   Second Session: Co-tx 13:03-1339 (1310-1415 total time)   Short Term Goals: Week 1:  OT Short Term Goal 1 (Week 1): Pt will consistently complete basic transfers with +1 assist in order to reduce caregiver burden OT Short Term Goal 2 (Week 1): Pt will recall hemi-dressing technique during UB dressing with min cuing OT Short Term Goal 3 (Week 1): Pt will complete sit>stand at sink with +1 assist during LB dressing task in order to reduce caregiver burden  Skilled Therapeutic Interventions/Progress Updates:    Session One: Pt seen for OT ADL bathing/dressing session. Pt in supine upon arrival with MD present completing morning [name] [name]. Pt smiling and agreeable to tx session, denying pain. She transferred to EOB with max A, VCs and increased time for processing sequencing and technique of transfer. Pt able to stand into STEDY with min A from slightly elevated EOB. Used STEDY to transfer pt to [facility] in shower. She bathed in shower, total A hand over hand to utilize R UE to assist with bathing. Pt demonstrated trace finger extension in 3rd and fourth digit of R hand today. Max A overall for bathing. Required mod A +2 to stand from [facility] into STEDY 2/2 motor planning deficits and poor attention to task in distracting ability.  She returned to w/c to dress. Pt picked out clothing items she wanted to wear from duffle bag. When therapist turned around to place duffle bag back, pt reached towards R foot, ultimately having total LOB to R out of chair, therapist unable to recover pt and pt landing on floor on R side. Alarm made and hospital staff including MD present to assess pt and assist with  maximove transfer back to bed. Pt denying pain. RN present assess vitals. Discussed with tx team safety measures for pt to increase safety and decrease risk of fall, plan to transition to tilt-in-space w/c.   Session Two: Pt seen for OT co-tx session with fellow OT. Pt in supine upon arrival, agreeable to tx session. Grimacing with movement to R knee, repositioned throughout for comfort.  Pt with soiled brief, total A for hygiene and donning clean brief from bed level, max A rolling to L, VCs for rolling to R in order to complete care. She transferred to sitting EOB with max  A+1. Dressed seated EOB, overall close supervision for sitting balance EOB, 2 total LOB to the R during LOB dressing requiring min A to return to midline. Pt able to recall hemi dressing techniques, however, requires assist for problem solving modified methods to complete hemi-dressing task.  With assist, pt able to cross LEs into figure four position to thread pants and don socks with min A. She stood from EOB with mod A +2 via 3 muskateer style in order to pull pants up, max cuing to facilitate erect standing posture. She completed squat pivot transfer to w/c, mod A transferring to L with increased time required for task initiation and motor planning. Pt taken to therapy gym for remainder of session, See Fellow OT note for further session details.  Therapy Documentation Precautions:  Precautions Precautions: Fall Precaution Comments: right hemiplegia, pushes right Restrictions Weight Bearing Restrictions: No   Therapy/Group: Individual Therapy  Yair Dusza L  05/30/2018, 6:59 AM

## 2018-05-30 NOTE — Progress Notes (Signed)
Speech Language Pathology Daily Session Note  Patient Details  Name: [name] [name] MRN: [id number] Date of Birth: 24-Oct-1985  Today's Date: 05/30/2018 SLP Individual Time: 1108-1200 SLP Individual Time Calculation (min): 52 min  Short Term Goals: Week 1: SLP Short Term Goal 1 (Week 1): Pt will initiate verbal response to question within 30 seconds in >25% of opportunities with mod assist multimodal cues.   SLP Short Term Goal 2 (Week 1): Pt will use compensatory strategies for mildly complex word finding during functional tasks with mod assist multimodal cues.   SLP Short Term Goal 3 (Week 1): Pt will sustain her attention to basic, familiar tasks for 5 minutes with mod assist multimodal cues for redirection.   SLP Short Term Goal 4 (Week 1): Pt will complete basic, familiar tasks with mod assist multimodal cues for functional problem solving.    Skilled Therapeutic Interventions:  Pt was seen for skilled ST targeting communication goals.  Pt was able to tell therapist about falling this morning in general terms but had difficulty describing what happened in specific detail.  SLP facilitated the session with a novel categorical naming task to address word finding.  Initially, pt needed min assist and increased time to name items within a targeted category; however, as task progressed suspect cognitive and/or physical fatigue became more of a limiting factor as pt needed up to mod cues for word finding.  Pt was left in bed at the end of today's therapy session with bed alarm set and call bell within reach.  Continue per current plan of care.    Pain Pain Assessment Pain Scale: 0-10 Pain Score: 0-No pain  Therapy/Group: Individual Therapy  Erma Raiche, Selinda Orion 05/30/2018, 1:06 PM

## 2018-05-30 NOTE — Progress Notes (Signed)
[facility] PHYSICAL MEDICINE & REHABILITATION PROGRESS NOTE  Subjective/Complaints:  Pt noted some right calf pain during therapy  ROS: Denies CP, SOB, N/V/D.  Objective: Vital Signs: Blood pressure 122/87, pulse 84, temperature 98.7 F (37.1 C), resp. rate 18, height 5\' 10"  (1.778 m), weight 88.1 kg, last menstrual period 05/03/2018, SpO2 100 %, currently breastfeeding. Vas [name] Lower Extremity Venous (dvt)  Result Date: 05/28/2018  Lower Venous Study Indications: Pain.  Performing Technologist: Maudry Mayhew MHA, RDMS, RVT, RDCS  Examination Guidelines: A complete evaluation includes B-mode imaging, spectral Doppler, color Doppler, and power Doppler as needed of all accessible portions of each vessel. Bilateral testing is considered an integral part of a complete examination. Limited examinations for reoccurring indications may be performed as noted.  Right Venous Findings: +---------+---------------+---------+-----------+----------+-------+          CompressibilityPhasicitySpontaneityPropertiesSummary +---------+---------------+---------+-----------+----------+-------+ CFV      Full           Yes      Yes                          +---------+---------------+---------+-----------+----------+-------+ SFJ      Full                                                 +---------+---------------+---------+-----------+----------+-------+ FV Prox  Full                                                 +---------+---------------+---------+-----------+----------+-------+ FV Mid   Full                                                 +---------+---------------+---------+-----------+----------+-------+ FV DistalFull                                                 +---------+---------------+---------+-----------+----------+-------+ PFV      Full                                                 +---------+---------------+---------+-----------+----------+-------+ POP       Full           Yes      Yes                          +---------+---------------+---------+-----------+----------+-------+ PTV      None                    No                   Acute   +---------+---------------+---------+-----------+----------+-------+ PERO     None                    No  Acute   +---------+---------------+---------+-----------+----------+-------+  Left Venous Findings: +---------+---------------+---------+-----------+----------+-------+          CompressibilityPhasicitySpontaneityPropertiesSummary +---------+---------------+---------+-----------+----------+-------+ CFV      Full           Yes      Yes                          +---------+---------------+---------+-----------+----------+-------+ SFJ      Full                                                 +---------+---------------+---------+-----------+----------+-------+ FV Prox  Full                                                 +---------+---------------+---------+-----------+----------+-------+ FV Mid   Full                                                 +---------+---------------+---------+-----------+----------+-------+ FV DistalFull                                                 +---------+---------------+---------+-----------+----------+-------+ PFV      Full                                                 +---------+---------------+---------+-----------+----------+-------+ POP      Full           Yes      Yes                          +---------+---------------+---------+-----------+----------+-------+ PTV      Full                                                 +---------+---------------+---------+-----------+----------+-------+ PERO     Full                                                 +---------+---------------+---------+-----------+----------+-------+    Summary: Right: Findings consistent with acute deep vein thrombosis  involving the right posterior tibial vein, and right peroneal vein. No cystic structure found in the popliteal fossa. Left: There is no evidence of deep vein thrombosis in the lower extremity. No cystic structure found in the popliteal fossa.  *See table(s) above for measurements and observations. Electronically signed by Deitra Mayo MD on 05/28/2018 at 5:10:49 PM.    Final    Recent Labs    05/28/18 0614  WBC 4.0  HGB 11.6*  HCT 36.9  PLT 245  Recent Labs    05/28/18 0614  NA 142  K 3.6  CL 105  CO2 25  GLUCOSE 101*  BUN 13  CREATININE 0.57  CALCIUM 9.7    Physical Exam: BP 122/87 (BP Location: Left Arm)   Pulse 84   Temp 98.7 F (37.1 C)   Resp 18   Ht 5\' 10"  (1.778 m)   Wt 88.1 kg   LMP 05/03/2018   SpO2 100%   BMI 27.87 kg/m  Constitutional: She appears well-developed. NAD. Obese  HENT: Atraumatic. Left crani incision  Eyes: EOMI. No discharge.  Cardiovascular: Normal rate and regular rhythm. No JVD. Respiratory: Effort normal and breath sounds normal.  GI: Bowel sounds are normal. She exhibits no distension. Musculoskeletal: mild tenderness in right calf no swelling or erythema. Calf is soft Neurological: She is alert.  Nonverbal, but appears to be responding appropriately.  Right upper extremity/right lower extremity: 0/5 proximal to distal.  Left upper extremity: 4/5 proximal distal Left lower extremity: 4-/5 proximal distal  Skin: She is not diaphoretic. Crani incision c/d/i  Psychiatric: Her affect is blunt. Nonverbal  Speech without dysarthria , poor initiation of speech Assessment/Plan: 1. Functional deficits secondary to left ACA with SAH which require 3+ hours per day of interdisciplinary therapy in a comprehensive inpatient rehab setting.  Physiatrist is providing close team supervision and 24 hour management of active medical problems listed below.  Physiatrist and rehab team continue to assess barriers to discharge/monitor patient  progress toward functional and medical goals  Care Tool:  Bathing    Body parts bathed by patient: Chest, Abdomen, Right arm, Right upper leg, Left upper leg, Face   Body parts bathed by helper: Left arm, Front perineal area, Buttocks, Right lower leg, Left lower leg     Bathing assist Assist Level: 2 Helpers     Upper Body Dressing/Undressing Upper body dressing   What is the patient wearing?: Pull over shirt    Upper body assist Assist Level: 2 Helpers(Sitting EOB)    Lower Body Dressing/Undressing Lower body dressing      What is the patient wearing?: Incontinence brief, Pants     Lower body assist Assist for lower body dressing: Maximal Assistance - Patient 25 - 49%     Toileting Toileting Toileting Activity did not occur (Clothing management and hygiene only): Safety/medical concerns  Toileting assist Assist for toileting: Total Assistance - Patient < 25%     Transfers Chair/bed transfer  Transfers assist  Chair/bed transfer activity did not occur: N/A  Chair/bed transfer assist level: Moderate Assistance - Patient 50 - 74%     Locomotion Ambulation   Ambulation assist      Assist level: 2 helpers Assistive device: Other (comment)(hall rail) Max distance: 25   Walk 10 feet activity   Assist     Assist level: 2 helpers     Walk 50 feet activity   Assist Walk 50 feet with 2 turns activity did not occur: Safety/medical concerns         Walk 150 feet activity   Assist Walk 150 feet activity did not occur: Safety/medical concerns         Walk 10 feet on uneven surface  activity   Assist Walk 10 feet on uneven surfaces activity did not occur: Safety/medical concerns         Wheelchair     Assist Will patient use wheelchair at discharge?: (TBD)   Wheelchair activity did not occur: Safety/medical concerns  Wheelchair assist  level: Minimal Assistance - Patient > 75% Max wheelchair distance: 150    Wheelchair 50  feet with 2 turns activity    Assist    Wheelchair 50 feet with 2 turns activity did not occur: Safety/medical concerns   Assist Level: Minimal Assistance - Patient > 75%   Wheelchair 150 feet activity     Assist Wheelchair 150 feet activity did not occur: Safety/medical concerns   Assist Level: Minimal Assistance - Patient > 75%      Medical Problem List and Plan: 1.  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   CIR PT, OT, SLP 2.  DVT Prophylaxis/Anticoagulation: Mechanical: Sequential compression devices, below knee Bilateral lower extremities Right peroneal and posterior tibial vein acute DVT- repeat dopplers in 1 wk- no anticoag due to [facility], pt is asymptomatic 3. Pain Management: Tylenol prn.  4. Mood: Team to provide ego support.  LCSW to follow for evaluation and support.  5. Neuropsych: This patient is not fully capable of making decisions on her own behalf. 6. Skin/Wound Care: Monitor incision for healing.  7. Fluids/Electrolytes/Nutrition: Monitor I/Os. Offer supplements if intake poor.   BMP normal 12/10 8. HTN: Monitor BP bid--continue amlodipine and nimotop.     Vitals:   05/29/18 2119 05/30/18 0459  BP: 114/83 122/87  Pulse: 80 84  Resp: 18 18  Temp: 99 F (37.2 C) 98.7 F (37.1 C)  SpO2: 100% 100%   9. [place]axella HCAP: Has completed antibiotic regimen on 12/4 10. ABLA: Continue to monitor H/H serially.   Hb 11.6 on 12/10 11.  Impaired fasting glucose: Likely stress related.   HbA1c 4.3 on 12/10- normal 12.  HypoK - supplement KCL- recheck 12/10 improved LOS: 3 days A FACE TO FACE EVALUATION WAS PERFORMED  Charlett Blake 05/30/2018, 7:29 AM

## 2018-05-30 NOTE — Progress Notes (Signed)
Occupational Therapy Session Note  Patient Details  Name: [name] [name] MRN: [id number] Date of Birth: 09-01-1985  Today's Date: 05/30/2018 OT Co-Treatment Time: 1340-1415(entire session 1310-1415) OT Co-Treatment Time Calculation (min): 35 min   Short Term Goals: Week 1:  OT Short Term Goal 1 (Week 1): Pt will consistently complete basic transfers with +1 assist in order to reduce caregiver burden OT Short Term Goal 2 (Week 1): Pt will recall hemi-dressing technique during UB dressing with min cuing OT Short Term Goal 3 (Week 1): Pt will complete sit>stand at sink with +1 assist during LB dressing task in order to reduce caregiver burden  Skilled Therapeutic Interventions/Progress Updates:    Engaged in co-treat with primary OT with focus on sit > stand, dynamic sitting and standing balance, and balance reactions. Completed hygiene at bed level and dressing from EOB with this therapist providing intermittent tactile cues to Rt torso and upper body to facilitate upright sitting posture and increased awareness of LOB to Rt during dynamic activities of LB dressing.  Mod assist squat pivot transfer to Lt with +2 for safety.  Engaged in sit > stand at large mirror in therapy gym to provide visual feedback for upright standing posture.  Pt progressed from max assist sit > stand to min assist with increased focus on pt initiation of movement.  Therapist providing tactile cues and blocking at Rt knee for increased stability and proprioceptive input.  Increased challenge to incorporate reaching in various planes to facilitate upright standing posture and balance reactions/awareness.  Pt smiling throughout activity and demonstrating increased activation of gluts in standing with reaching.  Pt returned to room and left upright in TIS w/c with seat belt alarm on and all needs in reach.  Therapy Documentation Precautions:  Precautions Precautions: Fall Precaution Comments: right hemiplegia, pushes  right Restrictions Weight Bearing Restrictions: No General:   Vital Signs: Therapy Vitals Temp: 98.6 F (37 C) Pulse Rate: 92 Resp: 17 BP: 120/90 Patient Position (if appropriate): Sitting Oxygen Therapy SpO2: 100 % O2 Device: Room Air Pain: Pt reports pain in Rt knee when rolling at bed level, otherwise no c/o pain  Therapy/Group: Co-Treatment  Willie Plain 05/30/2018, 3:44 PM

## 2018-05-30 NOTE — Progress Notes (Signed)
   05/30/18 0900  What Happened  Was fall witnessed? Yes  Who witnessed fall? PT  Patients activity before fall bathroom-assisted;shower  Point of contact hip/leg  Was patient injured? No  Patient found on floor  Follow Up  MD notified Dr. Letta Pate  Time MD notified [phone]  Family notified No- patient refusal  Additional tests Yes-comment (X-Ray of right knee)  Simple treatment Ice  Progress note created (see row info) Yes

## 2018-05-30 NOTE — Progress Notes (Signed)
   05/29/18 1800  What Happened  Was fall witnessed? No  Patients activity before fall other (comment) (sitting in Wheelchair)  Point of contact buttocks  Was patient injured? No  Patient found other (Comment) (siiting on floor)  Found by Staff-comment (CNA)  Stated prior activity other (comment) (sitting in wheelchair)

## 2018-05-31 ENCOUNTER — Inpatient Hospital Stay (HOSPITAL_COMMUNITY): Payer: Medicaid Other | Admitting: Occupational Therapy

## 2018-05-31 ENCOUNTER — Inpatient Hospital Stay (HOSPITAL_COMMUNITY): Payer: Medicaid Other | Admitting: Physical Therapy

## 2018-05-31 ENCOUNTER — Inpatient Hospital Stay (HOSPITAL_COMMUNITY): Payer: Medicaid Other | Admitting: Speech Pathology

## 2018-05-31 MED ORDER — TIZANIDINE HCL 2 MG PO TABS
2.0000 mg | ORAL_TABLET | Freq: Every day | ORAL | Status: DC
  Administered 2018-05-31 – 2018-06-05 (×6): 2 mg via ORAL
  Filled 2018-05-31 (×6): qty 1

## 2018-05-31 NOTE — Progress Notes (Signed)
Physical Therapy Session Note  Patient Details  Name: [name] [name] MRN: [id number] Date of Birth: 12/19/1985  Today's Date: 05/31/2018 PT Individual Time: 1430-1530 PT Individual Time Calculation (min): 60 min   Short Term Goals: Week 1:  PT Short Term Goal 1 (Week 1): Pt will perform bed mobility minA PT Short Term Goal 2 (Week 1): Pt will perform transfers modA PT Short Term Goal 3 (Week 1): Pt will perform sit <>stand modA PT Short Term Goal 4 (Week 1): Pt will perform gait x25' maxA +1 PT Short Term Goal 5 (Week 1): Pt will initiate w/c propulsion  Skilled Therapeutic Interventions/Progress Updates:    Patient received in bed, pleasant and willing to participate in PT session this afternoon. Able to complete bed mobility with ModA and maintain sitting balance with close S at edge of bed; ModAx1/stand by of +1 for safety for squat pivot transfer into [facility], continued practicing transfers this session with education and cues provided for head-hips relationship to improve ease of squat pivot. Attempted sit to stand with MinA of +2 and max facilitation R quad, unable to maintain on first attempt due to pain in area of clot. Returned to sitting and worked on lateral trunk leans to R elbow with MinA and rocking to R with L LE crossed over R LE for weight shifting to R side. RN provided pain medication which improved pain and patient was then able to tolerate 2 additional sit to stands with MinAx2 and Max cues for upright posture, Mod facilitation R quad. Also performed multiple stands in standing frame while performing cross-midline reaching for weight shifting to R. Only able to maintain standing for 2-3 minutes on first attempt, but able to tolerate 7-9 minutes with reaching activities on second attempt in frame. She was returned to bed with ModA squat pivot of +1 and additional +1 present for safety, and was left in bed with all needs met and bed alarm active this afternoon.   Therapy  Documentation Precautions:  Precautions Precautions: Fall Precaution Comments: right hemiplegia, pushes right Restrictions Weight Bearing Restrictions: No General:   Pain: Pain Assessment Pain Scale: 0-10 Pain Score: 6  Pain Type: Acute pain Pain Location: Calf Pain Orientation: Right Pain Descriptors / Indicators: Aching;Sore Pain Onset: On-going Patients Stated Pain Goal: 0 Pain Intervention(s): Medication (See eMAR);RN made aware Multiple Pain Sites: No    Therapy/Group: Individual Therapy  Deniece Ree PT, DPT, CBIS  Supplemental Physical Therapist [facility]    Pager [phone] Acute Rehab Office [phone]   05/31/2018, 3:38 PM

## 2018-05-31 NOTE — Progress Notes (Signed)
Speech Language Pathology Daily Session Note  Patient Details  Name: [name] [name] MRN: [id number] Date of Birth: 08-23-1985  Today's Date: 05/31/2018 SLP Individual Time: 0805-0900 SLP Individual Time Calculation (min): 55 min  Short Term Goals: Week 1: SLP Short Term Goal 1 (Week 1): Pt will initiate verbal response to question within 30 seconds in >25% of opportunities with mod assist multimodal cues.   SLP Short Term Goal 2 (Week 1): Pt will use compensatory strategies for mildly complex word finding during functional tasks with mod assist multimodal cues.   SLP Short Term Goal 3 (Week 1): Pt will sustain her attention to basic, familiar tasks for 5 minutes with mod assist multimodal cues for redirection.   SLP Short Term Goal 4 (Week 1): Pt will complete basic, familiar tasks with mod assist multimodal cues for functional problem solving.    Skilled Therapeutic Interventions:  Pt was seen for skilled ST targeting cognitive goals.  Pt was in bed upon arrival with nurse tech present, setting up breakfast tray.  Pt needed min-mod assist cues for task organization, initiation, and problem solving when self feeding.  SLP facilitated the session with a novel card game to address problem solving and attention goals.  Pt was able to sustain her attention to task for ~5 minute intervals before requiring min cues for redirection.  She could plan and execute a basic problem solving strategy within task with min-mod assist verbal cues.  Pt was left in bed at the end of today's therapy session with bed alarm set and all needs within reach.  Continue per current plan of care.    Pain Pain Assessment Pain Scale: 0-10 Pain Score: 0-No pain  Therapy/Group: Individual Therapy  Cameran Ahmed, Selinda Orion 05/31/2018, 8:56 AM

## 2018-05-31 NOTE — Progress Notes (Signed)
Occupational Therapy Session Note  Patient Details  Name: [name] [name] MRN: [id number] Date of Birth: [birth year]  Today's Date: 05/31/2018 OT Individual Time: 1537-9432 OT Individual Time Calculation (min): 72 min    Short Term Goals: Week 1:  OT Short Term Goal 1 (Week 1): Pt will consistently complete basic transfers with +1 assist in order to reduce caregiver burden OT Short Term Goal 2 (Week 1): Pt will recall hemi-dressing technique during UB dressing with min cuing OT Short Term Goal 3 (Week 1): Pt will complete sit>stand at sink with +1 assist during LB dressing task in order to reduce caregiver burden  Skilled Therapeutic Interventions/Progress Updates:    Pt seen for OT session focusing on ADL re-training, functional sitting balance and transfer. Pt awake in supine upon arrival, smiliing in agreement to tx session. Complaints of pain with movement to R knee, reports RN is aware, pt repositioned for comfort throughout session. Pt in soiled brief and bedding, +1 assist for hygiene and donning clean brief, mod A rolling to L, VCs rolling to R. Pt able to assist with peri-area hygiene from bed level.  Sat EOB with mod A +1, able to use L LE to assist with management of R LE. She dressed seated EOB, able to recall hemi-dressing techqniue independently. With assist, able to cross LEs into figure four position to thread pants and don shoes, improvements in dynamic sitting balance, able to maintain at overall supervision level during task. Pt stood from slightly elevated EOB with +2 present for safety, however, powers into standing with min A +1 with increased time, max A standing balance with blocking of L knee once in standing and +2 required to pull pants up from standing position. She completed mod A squat pivot transfer, significantly increased time to initiate and motor plan in order to assist with transfer. Grooming tasks completed from w/c level at sink with assist for set-up. Total  A for use of hand over hand of R UE for stabilizing self-care items and increased time to initiate/motor plan teeth brushing task. In therapy gym, attempted use of arm skate as pt demonstrates trace movements of UE muscle groups. Pt unable to attend to task and R UE in moderately simulating environment. Pt stating "it doesn't work" and not able to attend to task enough for successful attempt to be made.  Pt returned to room at end of session, left in tilt-in-space w/c in reclined position with chair belt alarm donned and all needs in reach.   Therapy Documentation Precautions:  Precautions Precautions: Fall Precaution Comments: right hemiplegia, pushes right Restrictions Weight Bearing Restrictions: No   Therapy/Group: Individual Therapy  [name] 05/31/2018, 6:46 AM

## 2018-05-31 NOTE — Progress Notes (Signed)
[place] PHYSICAL MEDICINE & REHABILITATION PROGRESS NOTE  Subjective/Complaints:  [name] [name] out of WC during OT yesterday, had R kne pain but normal exam ( except her weakness)  ROS: Denies CP, SOB, N/V/D.  Objective: Vital Signs: Blood pressure 105/77, pulse 78, temperature 98.9 F (37.2 C), resp. rate 18, height 5\' 10"  (1.778 m), weight 89.4 kg, last menstrual period 05/03/2018, SpO2 100 %, currently breastfeeding. Dg Knee 1-2 Views Right  Result Date: 05/30/2018 CLINICAL DATA:  Knee pain.  Fall EXAM: RIGHT KNEE - 1-2 VIEW COMPARISON:  None. FINDINGS: No evidence of fracture, dislocation, or joint effusion. No evidence of arthropathy or other focal bone abnormality. Soft tissues are unremarkable. IMPRESSION: Negative. Electronically Signed   By: Rolm Baptise M.D.   On: 05/30/2018 09:37   No results for input(s): WBC, HGB, HCT, PLT in the last 72 hours. No results for input(s): NA, K, CL, CO2, GLUCOSE, BUN, CREATININE, CALCIUM in the last 72 hours.  Physical Exam: BP 105/77 (BP Location: Left Arm)   Pulse 78   Temp 98.9 F (37.2 C)   Resp 18   Ht 5\' 10"  (1.778 m)   Wt 89.4 kg   LMP 05/03/2018   SpO2 100%   BMI 28.28 kg/m  Constitutional: She appears well-developed. NAD. Obese  HENT: Atraumatic. Left crani incision  Eyes: EOMI. No discharge.  Cardiovascular: Normal rate and regular rhythm. No JVD. Respiratory: Effort normal and breath sounds normal.  GI: Bowel sounds are normal. She exhibits no distension. Musculoskeletal: mild tenderness in right calf no swelling or erythema. Calf is soft, no pain with knee ROM Neurological: She is alert.  Nonverbal, but appears to be responding appropriately.  Right upper extremity/right lower extremity: 0/5 proximal to distal. Increased R hamstring tone Left upper extremity: 4/5 proximal distal Left lower extremity: 4-/5 proximal distal  Skin: She is not diaphoretic. Crani incision c/d/i  Psychiatric: Her affect is blunt. Nonverbal   Speech without dysarthria , poor initiation of speech Assessment/Plan: 1. Functional deficits secondary to left ACA with SAH which require 3+ hours per day of interdisciplinary therapy in a comprehensive inpatient rehab setting.  Physiatrist is providing close team supervision and 24 hour management of active medical problems listed below.  Physiatrist and rehab team continue to assess barriers to discharge/monitor patient progress toward functional and medical goals  Care Tool:  Bathing    Body parts bathed by patient: Chest, Abdomen, Right arm, Right upper leg, Left upper leg, Face   Body parts bathed by helper: Left arm, Front perineal area, Buttocks, Right lower leg, Left lower leg     Bathing assist Assist Level: 2 Helpers     Upper Body Dressing/Undressing Upper body dressing   What is the patient wearing?: Pull over shirt    Upper body assist Assist Level: 2 Helpers(Sitting EOB)    Lower Body Dressing/Undressing Lower body dressing      What is the patient wearing?: Incontinence brief, Pants     Lower body assist Assist for lower body dressing: Maximal Assistance - Patient 25 - 49%     Toileting Toileting Toileting Activity did not occur (Clothing management and hygiene only): Safety/medical concerns  Toileting assist Assist for toileting: Total Assistance - Patient < 25%     Transfers Chair/bed transfer  Transfers assist  Chair/bed transfer activity did not occur: N/A  Chair/bed transfer assist level: Moderate Assistance - Patient 50 - 74%     Locomotion Ambulation   Ambulation assist      Assist  level: 2 helpers Assistive device: Other (comment)(hall rail) Max distance: 25   Walk 10 feet activity   Assist     Assist level: 2 helpers     Walk 50 feet activity   Assist Walk 50 feet with 2 turns activity did not occur: Safety/medical concerns         Walk 150 feet activity   Assist Walk 150 feet activity did not occur:  Safety/medical concerns         Walk 10 feet on uneven surface  activity   Assist Walk 10 feet on uneven surfaces activity did not occur: Safety/medical concerns         Wheelchair     Assist Will patient use wheelchair at discharge?: (TBD)   Wheelchair activity did not occur: Safety/medical concerns  Wheelchair assist level: Minimal Assistance - Patient > 75% Max wheelchair distance: 150    Wheelchair 50 feet with 2 turns activity    Assist    Wheelchair 50 feet with 2 turns activity did not occur: Safety/medical concerns   Assist Level: Minimal Assistance - Patient > 75%   Wheelchair 150 feet activity     Assist Wheelchair 150 feet activity did not occur: Safety/medical concerns   Assist Level: Minimal Assistance - Patient > 75%      Medical Problem List and Plan: 1.  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   CIR PT, OT, SLP- impulsivity.Fall with initial complaints of R knee pain  Which have resolved, xray neg 2.  DVT Prophylaxis/Anticoagulation: Mechanical: Sequential compression devices, below knee Bilateral lower extremities Right peroneal and posterior tibial vein acute DVT- repeat dopplers in 1 wk- no anticoag due to [facility], pt is asymptomatic 3. Pain Management: Tylenol prn.  4. Mood: Team to provide ego support.  LCSW to follow for evaluation and support.  5. Neuropsych: This patient is not fully capable of making decisions on her own behalf. 6. Skin/Wound Care: Monitor incision for healing.  7. Fluids/Electrolytes/Nutrition: Monitor I/Os. Offer supplements if intake poor. Po intake 30-100% meals  BMP normal 12/10 8. HTN: Monitor BP bid--continue amlodipine and nimotop.     Vitals:   05/30/18 2159 05/31/18 0537  BP: 102/75 105/77  Pulse: 82 78  Resp: 18 18  Temp: 98.8 F (37.1 C) 98.9 F (37.2 C)  SpO2: 100% 100%   9. Moraxella HCAP: Has completed antibiotic  regimen on 12/4 10. ABLA: Continue to monitor H/H serially.   Hb 11.6 on 12/10 11.  Impaired fasting glucose: Likely stress related.   HbA1c 4.3 on 12/10- normal 12.  HypoK - supplement KCL- recheck 12/10 improved 13.  FLexor withdrawal spasms RLE- start tizanidine at noc- cont PT LOS: 4 days A FACE TO FACE EVALUATION WAS PERFORMED  Charlett Blake 05/31/2018, 7:17 AM

## 2018-06-01 ENCOUNTER — Inpatient Hospital Stay (HOSPITAL_COMMUNITY): Payer: Medicaid Other | Admitting: Physical Therapy

## 2018-06-01 ENCOUNTER — Inpatient Hospital Stay (HOSPITAL_COMMUNITY): Payer: Medicaid Other | Admitting: Occupational Therapy

## 2018-06-01 ENCOUNTER — Inpatient Hospital Stay (HOSPITAL_COMMUNITY): Payer: Medicaid Other | Admitting: Speech Pathology

## 2018-06-01 NOTE — Progress Notes (Signed)
Physical Therapy Session Note  Patient Details  Name: [name] [name] MRN: [id number] Date of Birth: May 10, 1986  Today's Date: 06/01/2018 PT Individual Time: 0950-1040 PT Individual Time Calculation (min): 50 min   Short Term Goals: Week 1:  PT Short Term Goal 1 (Week 1): Pt will perform bed mobility minA PT Short Term Goal 2 (Week 1): Pt will perform transfers modA PT Short Term Goal 3 (Week 1): Pt will perform sit <>stand modA PT Short Term Goal 4 (Week 1): Pt will perform gait x25' maxA +1 PT Short Term Goal 5 (Week 1): Pt will initiate w/c propulsion  Skilled Therapeutic Interventions/Progress Updates:   Pt in supine and agreeable to therapy, pain 6/10 in RLE @ site of DVT, ongoing and RN aware. Transferred to EOB w/ max assist and to w/c via stedy +1 assist. Total assist w/c transport to/from therapy gym. Worked on pre-gait tasks at rail. Mod assist to stand w/ manual cues for R quad and glut activation when boosting up.  Pre-gait tasks in standing including lateral weight shifting. Attempted to take L forward and backward steps to work on R quad activation, however pt unable to take a step 2/2 fear and decreased confidence in RLE and therapist supporting her. Kinetron 2 min on, 1 min off, 5 reps w/ min manual assist for neutral RLE alignment, 50 cm/sec resistance. Pt pleasant and engaging w/ this therapist, but required max encouragement to participate in tasks today. States "I just don't want to do anything today". Returned to room and ended session reclined in TIS, all needs in reach. Missed 10 min of skilled PT 2/2 refusal/poor participation.   Therapy Documentation Precautions:  Precautions Precautions: Fall Precaution Comments: right hemiplegia, pushes right Restrictions Weight Bearing Restrictions: No  Therapy/Group: Individual Therapy  Larayne Baxley Clent Demark 06/01/2018, 10:41 AM

## 2018-06-01 NOTE — Progress Notes (Signed)
Occupational Therapy Session Note  Patient Details  Name: [name] [name] MRN: [id number] Date of Birth: 1985-11-30  Today's Date: 06/01/2018 OT Individual Time: 9476-5465 OT Individual Time Calculation (min): 69 min   Short Term Goals: Week 1:  OT Short Term Goal 1 (Week 1): Pt will consistently complete basic transfers with +1 assist in order to reduce caregiver burden OT Short Term Goal 2 (Week 1): Pt will recall hemi-dressing technique during UB dressing with min cuing OT Short Term Goal 3 (Week 1): Pt will complete sit>stand at sink with +1 assist during LB dressing task in order to reduce caregiver burden  Skilled Therapeutic Interventions/Progress Updates:    Pt greeted in TIS, requiring encouragement to participate. Declining ADL engagement. Instead, escorted pt to dayroom and worked on [name] using handout. Reviewed joint protection and self ROM techniques with pt. She often required [name] assist for positioning and carryover of technique. Also completed gentle cervical stretches with limited ROM when laterally rotating towards Rt. Educated pt to complete these gentle stretches daily to improve head turning ability. Throughout session, played upbeat music to increase volition and affect. She did sit up unsupported in TIS for 1 song while we worked on trunk rotation in rhythm to music. Mirror used for visual feedback, with pt smiling and laughing at this time. At end of session pt was returned to room and completed handwashing and lotion application at sink. Increased time and cues required for motor planning and inclusion of R UE into tasks. She attempted to insert lotion dispenser into her mouth. At end of session pt was reclined in TIS with safety belt fastened and set up for lunch. Call bell within reach.   Included HEP in pt education binder   Therapy Documentation Precautions:  Precautions Precautions: Fall Precaution Comments: right hemiplegia, pushes  right Restrictions Weight Bearing Restrictions: No Pain: No s/s pain during session    ADL:       Therapy/Group: Individual Therapy  [name] 06/01/2018, 12:52 PM

## 2018-06-01 NOTE — Progress Notes (Signed)
Speech Language Pathology Daily Session Note  Patient Details  Name: [name] [name] MRN: [id number] Date of Birth: May 01, 1986  Today's Date: 06/01/2018 SLP Individual Time: 1310-1400 SLP Individual Time Calculation (min): 50 min  Short Term Goals: Week 1: SLP Short Term Goal 1 (Week 1): Pt will initiate verbal response to question within 30 seconds in >25% of opportunities with mod assist multimodal cues.   SLP Short Term Goal 2 (Week 1): Pt will use compensatory strategies for mildly complex word finding during functional tasks with mod assist multimodal cues.   SLP Short Term Goal 3 (Week 1): Pt will sustain her attention to basic, familiar tasks for 5 minutes with mod assist multimodal cues for redirection.   SLP Short Term Goal 4 (Week 1): Pt will complete basic, familiar tasks with mod assist multimodal cues for functional problem solving.    Skilled Therapeutic Interventions: Skilled treatment session focused on cognitive-linguistic goals. SLP facilitated session by providing extra time and overall Min A verbal cues for problem solving during a basic money management task. SLP also facilitated session by providing extra time and overall Max A verbal and question cues for patient to verbally describe a simple, functional item at the phrase level. Patient consistently replied, "I don't know" but when asked an open-ended, broad question, she was able to provide the description independently. Patient transferred back to bed at end of session via the [facility]. Patient left supine in bed with alarm on and all needs within reach. Continue with current plan of care.       Pain No/Denies Pain   Therapy/Group: Individual Therapy  Garry Bochicchio 06/01/2018, 3:04 PM

## 2018-06-01 NOTE — Progress Notes (Signed)
[place] PHYSICAL MEDICINE & REHABILITATION PROGRESS NOTE  Subjective/Complaints:  Pt eating breakfast. No new pains after fall. States that she's comfortable this morning  ROS: Patient denies fever, rash, sore throat, blurred vision, nausea, vomiting, diarrhea, cough, shortness of breath or chest pain, joint or back pain, headache, or mood change.   Objective: Vital Signs: Blood pressure 113/72, pulse 74, temperature 98.5 F (36.9 C), temperature source Oral, resp. rate 15, height 5\' 10"  (1.778 m), weight 89.5 kg, last menstrual period 05/03/2018, SpO2 (!) 76 %, currently breastfeeding. Dg Knee 1-2 Views Right  Result Date: 05/30/2018 CLINICAL DATA:  Knee pain.  Fall EXAM: RIGHT KNEE - 1-2 VIEW COMPARISON:  None. FINDINGS: No evidence of fracture, dislocation, or joint effusion. No evidence of arthropathy or other focal bone abnormality. Soft tissues are unremarkable. IMPRESSION: Negative. Electronically Signed   By: Rolm Baptise M.D.   On: 05/30/2018 09:37   No results for input(s): WBC, HGB, HCT, PLT in the last 72 hours. No results for input(s): NA, K, CL, CO2, GLUCOSE, BUN, CREATININE, CALCIUM in the last 72 hours.  Physical Exam: BP 113/72 (BP Location: Left Arm)   Pulse 74   Temp 98.5 F (36.9 C) (Oral)   Resp 15   Ht 5\' 10"  (1.778 m)   Wt 89.5 kg   LMP 05/03/2018   SpO2 (!) 76%   BMI 28.31 kg/m  Constitutional: No distress . Vital signs reviewed. HEENT: EOMI, oral membranes moist, crani incision Neck: supple Cardiovascular: RRR without murmur. No JVD    Respiratory: CTA Bilaterally without wheezes or rales. Normal effort    GI: BS +, non-tender, non-distended . Musculoskeletal: mild right leg tenderness Neurological: She is alert.  Delayed processing.  Right upper extremity/right lower extremity: 0/5 proximal to distal. Increased R hamstring tone Left upper extremity: 4/5 proximal distal Left lower extremity: 4-/5 proximal distal  Skin: She is not diaphoretic.  Crani incision c/d/i  Psychiatric: remains flat  Speech without dysarthria.  Assessment/Plan: 1. Functional deficits secondary to left ACA with SAH which require 3+ hours per day of interdisciplinary therapy in a comprehensive inpatient rehab setting.  Physiatrist is providing close team supervision and 24 hour management of active medical problems listed below.  Physiatrist and rehab team continue to assess barriers to discharge/monitor patient progress toward functional and medical goals  Care Tool:  Bathing    Body parts bathed by patient: Chest, Abdomen, Right arm, Right upper leg, Left upper leg, Face   Body parts bathed by helper: Left arm, Front perineal area, Buttocks, Right lower leg, Left lower leg     Bathing assist Assist Level: 2 Helpers     Upper Body Dressing/Undressing Upper body dressing   What is the patient wearing?: Pull over shirt    Upper body assist Assist Level: Minimal Assistance - Patient > 75%    Lower Body Dressing/Undressing Lower body dressing      What is the patient wearing?: Incontinence brief, Pants     Lower body assist Assist for lower body dressing: 2 Helpers     Toileting Toileting Toileting Activity did not occur (Clothing management and hygiene only): Safety/medical concerns  Toileting assist Assist for toileting: Total Assistance - Patient < 25%     Transfers Chair/bed transfer  Transfers assist  Chair/bed transfer activity did not occur: N/A  Chair/bed transfer assist level: Moderate Assistance - Patient 50 - 74%     Locomotion Ambulation   Ambulation assist      Assist level: 2  helpers Assistive device: Other (comment)(hall rail) Max distance: 25   Walk 10 feet activity   Assist     Assist level: 2 helpers     Walk 50 feet activity   Assist Walk 50 feet with 2 turns activity did not occur: Safety/medical concerns         Walk 150 feet activity   Assist Walk 150 feet activity did not occur:  Safety/medical concerns         Walk 10 feet on uneven surface  activity   Assist Walk 10 feet on uneven surfaces activity did not occur: Safety/medical concerns         Wheelchair     Assist Will patient use wheelchair at discharge?: (TBD)   Wheelchair activity did not occur: Safety/medical concerns  Wheelchair assist level: Minimal Assistance - Patient > 75% Max wheelchair distance: 150    Wheelchair 50 feet with 2 turns activity    Assist    Wheelchair 50 feet with 2 turns activity did not occur: Safety/medical concerns   Assist Level: Minimal Assistance - Patient > 75%   Wheelchair 150 feet activity     Assist Wheelchair 150 feet activity did not occur: Safety/medical concerns   Assist Level: Minimal Assistance - Patient > 75%      Medical Problem List and Plan: 1.  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   -Continue CIR therapies including PT, OT, and SLP  2.  DVT Prophylaxis/Anticoagulation: Mechanical: Sequential compression devices, below knee Bilateral lower extremities Right peroneal and posterior tibial vein acute DVT- repeat dopplers in 1 wk- no anticoag due to [facility], pt is asymptomatic 3. Pain Management: Tylenol prn.  4. Mood: Team to provide ego support.  LCSW to follow for evaluation and support.  5. Neuropsych: This patient is not fully capable of making decisions on her own behalf. 6. Skin/Wound Care: Monitor incision for healing.  7. Fluids/Electrolytes/Nutrition: Monitor I/Os. Offer supplements if intake poor. Po intake 30-100% meals  BMP normal 12/10 8. HTN: Monitor BP bid--continue amlodipine and nimotop.     Vitals:   05/31/18 1956 06/01/18 0533  BP: 104/69 113/72  Pulse: 69 74  Resp: 14 15  Temp: 98.2 F (36.8 C) 98.5 F (36.9 C)  SpO2: 100% (!) 76%   9. Moraxella HCAP: Has completed antibiotic regimen on 12/4 10. ABLA: Continue to monitor H/H  serially.   Hb 11.6 on 12/10 11.  Impaired fasting glucose: Likely stress related.   HbA1c 4.3 on 12/10- normal 12.  HypoK - supplement KCL- recheck 12/10 improved 13.  FLexor withdrawal spasms RLE- started tizanidine qhs- cont ROM with PT   LOS: 5 days A FACE TO FACE EVALUATION WAS PERFORMED  Meredith Staggers 06/01/2018, 9:00 AM

## 2018-06-02 DIAGNOSIS — E876 Hypokalemia: Secondary | ICD-10-CM

## 2018-06-02 NOTE — Plan of Care (Signed)
  Problem: RH BLADDER ELIMINATION Goal: RH STG MANAGE BLADDER WITH ASSISTANCE Description STG Manage Bladder With Assistance. Max  Outcome: Not Progressing; incontinence

## 2018-06-02 NOTE — Progress Notes (Signed)
[place] PHYSICAL MEDICINE & REHABILITATION PROGRESS NOTE  Subjective/Complaints:  Eating breakfast. Denies any new or persistent pain.   ROS: Patient denies fever, rash, sore throat, blurred vision, nausea, vomiting, diarrhea, cough, shortness of breath or chest pain, joint or back pain, headache, or mood change.   Objective: Vital Signs: Blood pressure 98/62, pulse 79, temperature 98.7 F (37.1 C), resp. rate 20, height 5\' 10"  (1.778 m), weight 88.2 kg, last menstrual period 05/03/2018, SpO2 99 %, currently breastfeeding. No results found. No results for input(s): WBC, HGB, HCT, PLT in the last 72 hours. No results for input(s): NA, K, CL, CO2, GLUCOSE, BUN, CREATININE, CALCIUM in the last 72 hours.  Physical Exam: BP 98/62 (BP Location: Left Arm)   Pulse 79   Temp 98.7 F (37.1 C)   Resp 20   Ht 5\' 10"  (1.778 m)   Wt 88.2 kg   LMP 05/03/2018   SpO2 99%   BMI 27.90 kg/m  Constitutional: No distress . Vital signs reviewed. HEENT: EOMI, oral membranes moist Neck: supple Cardiovascular: RRR without murmur. No JVD    Respiratory: CTA Bilaterally without wheezes or rales. Normal effort    GI: BS +, non-tender, non-distended  Musculoskeletal: mild right leg tenderness improved today Neurological: She is alert.  Delayed processing.  Right upper extremity/right lower extremity: 0/5 proximal to distal. Increased R hamstring tone Left upper extremity: 4/5 proximal distal Left lower extremity: 4-/5 proximal distal  Skin: She is not diaphoretic. Crani incision c/d/i  Psychiatric: remains flat, was able to get her to smile today    Assessment/Plan: 1. Functional deficits secondary to left ACA with SAH which require 3+ hours per day of interdisciplinary therapy in a comprehensive inpatient rehab setting.  Physiatrist is providing close team supervision and 24 hour management of active medical problems listed below.  Physiatrist and rehab team continue to assess barriers to  discharge/monitor patient progress toward functional and medical goals  Care Tool:  Bathing    Body parts bathed by patient: Chest, Abdomen, Right arm, Right upper leg, Left upper leg, Face   Body parts bathed by helper: Left arm, Front perineal area, Buttocks, Right lower leg, Left lower leg     Bathing assist Assist Level: 2 Helpers     Upper Body Dressing/Undressing Upper body dressing   What is the patient wearing?: Pull over shirt    Upper body assist Assist Level: Minimal Assistance - Patient > 75%    Lower Body Dressing/Undressing Lower body dressing      What is the patient wearing?: Incontinence brief, Pants     Lower body assist Assist for lower body dressing: 2 Helpers     Toileting Toileting Toileting Activity did not occur (Clothing management and hygiene only): Safety/medical concerns  Toileting assist Assist for toileting: Total Assistance - Patient < 25%     Transfers Chair/bed transfer  Transfers assist  Chair/bed transfer activity did not occur: N/A  Chair/bed transfer assist level: Dependent - mechanical lift(stedy)     Locomotion Ambulation   Ambulation assist      Assist level: 2 helpers Assistive device: Other (comment)(hall rail) Max distance: 25   Walk 10 feet activity   Assist     Assist level: 2 helpers     Walk 50 feet activity   Assist Walk 50 feet with 2 turns activity did not occur: Safety/medical concerns         Walk 150 feet activity   Assist Walk 150 feet activity did not  occur: Safety/medical concerns         Walk 10 feet on uneven surface  activity   Assist Walk 10 feet on uneven surfaces activity did not occur: Safety/medical concerns         Wheelchair     Assist Will patient use wheelchair at discharge?: (TBD)   Wheelchair activity did not occur: Safety/medical concerns  Wheelchair assist level: Minimal Assistance - Patient > 75% Max wheelchair distance: 150    Wheelchair  50 feet with 2 turns activity    Assist    Wheelchair 50 feet with 2 turns activity did not occur: Safety/medical concerns   Assist Level: Minimal Assistance - Patient > 75%   Wheelchair 150 feet activity     Assist Wheelchair 150 feet activity did not occur: Safety/medical concerns   Assist Level: Minimal Assistance - Patient > 75%      Medical Problem List and Plan: 1.  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   -Continue CIR therapies including PT, OT, and SLP  2.  DVT Prophylaxis/Anticoagulation: Mechanical: Sequential compression devices, below knee Bilateral lower extremities Right peroneal and posterior tibial vein acute DVT- repeat dopplers in 1 wk (12/17)- no anticoag due to [facility], pt is asymptomatic 3. Pain Management: Tylenol prn.  4. Mood: Team to provide ego support.  LCSW to follow for evaluation and support.  5. Neuropsych: This patient is not fully capable of making decisions on her own behalf. 6. Skin/Wound Care: Monitor incision for healing.  7. Fluids/Electrolytes/Nutrition: Monitor I/Os. Offer supplements if intake poor. Po intake 30-100% meals overall  BMP normal 12/10 8. HTN: Monitor BP bid--continue amlodipine and nimotop.     Vitals:   06/02/18 0605 06/02/18 1449  BP: 108/75 98/62  Pulse: 77 79  Resp: 18 20  Temp: 98.7 F (37.1 C) 98.7 F (37.1 C)  SpO2: 100% 99%   9. Moraxella HCAP: Has completed antibiotic regimen on 12/4 10. ABLA: Continue to monitor H/H serially.   Hb 11.6 on 12/10  -recheck labs monday 11.  Impaired fasting glucose: Likely stress related.   HbA1c 4.3 on 12/10- normal 12.  HypoK - supplement KCL- recheck 12/10 improved  -recheck labs monday 13.  FLexor withdrawal spasms RLE- started tizanidine qhs- cont ROM with PT   LOS: 6 days A FACE TO FACE EVALUATION WAS PERFORMED  Meredith Staggers 06/02/2018, 3:14 PM

## 2018-06-03 ENCOUNTER — Inpatient Hospital Stay (HOSPITAL_COMMUNITY): Payer: Medicaid Other | Admitting: Occupational Therapy

## 2018-06-03 ENCOUNTER — Inpatient Hospital Stay (HOSPITAL_COMMUNITY): Payer: Medicaid Other | Admitting: Physical Therapy

## 2018-06-03 ENCOUNTER — Inpatient Hospital Stay (HOSPITAL_COMMUNITY): Payer: Medicaid Other | Admitting: Speech Pathology

## 2018-06-03 DIAGNOSIS — E876 Hypokalemia: Secondary | ICD-10-CM

## 2018-06-03 LAB — CBC
HCT: 35.4 % — ABNORMAL LOW (ref 36.0–46.0)
Hemoglobin: 11.3 g/dL — ABNORMAL LOW (ref 12.0–15.0)
MCH: 31.2 pg (ref 26.0–34.0)
MCHC: 31.9 g/dL (ref 30.0–36.0)
MCV: 97.8 fL (ref 80.0–100.0)
NRBC: 0 % (ref 0.0–0.2)
Platelets: 179 10*3/uL (ref 150–400)
RBC: 3.62 MIL/uL — AB (ref 3.87–5.11)
RDW: 12.2 % (ref 11.5–15.5)
WBC: 2.8 10*3/uL — ABNORMAL LOW (ref 4.0–10.5)

## 2018-06-03 LAB — BASIC METABOLIC PANEL
Anion gap: 10 (ref 5–15)
BUN: 8 mg/dL (ref 6–20)
CO2: 27 mmol/L (ref 22–32)
Calcium: 9.6 mg/dL (ref 8.9–10.3)
Chloride: 106 mmol/L (ref 98–111)
Creatinine, Ser: 0.63 mg/dL (ref 0.44–1.00)
GFR calc non Af Amer: >60 mL/min (ref >60)
Glucose, Bld: 89 mg/dL (ref 70–99)
Potassium: 4 mmol/L (ref 3.5–5.1)
Sodium: 143 mmol/L (ref 135–145)

## 2018-06-03 NOTE — Progress Notes (Signed)
Occupational Therapy Session Note  Patient Details  Name: [name] [name] MRN: [id number] Date of Birth: 05-22-1986  Today's Date: 06/03/2018 OT Individual Time: 1000-1100 OT Individual Time Calculation (min): 60 min    Short Term Goals: Week 1:  OT Short Term Goal 1 (Week 1): Pt will consistently complete basic transfers with +1 assist in order to reduce caregiver burden OT Short Term Goal 2 (Week 1): Pt will recall hemi-dressing technique during UB dressing with min cuing OT Short Term Goal 3 (Week 1): Pt will complete sit>stand at sink with +1 assist during LB dressing task in order to reduce caregiver burden  Skilled Therapeutic Interventions/Progress Updates:    Pt seen for OT ADL bathing/dressing session. Pt awake and alert upon arrival, denied pain at rest. Pt verbalizing request to shower this session. She transferred to EOB with HOB fully elevated and mod A to position hips and management of R LE. STEDY used for functional transfers throughout session, overall min A for sit>stand into STEDY with increased time for processing and motor planning. She bathed seated on BSC in shower, hand over hand assist to use R UE to wash L UE, pt demonstrating return of L UE, demonstrates improving ability to supinate/pronate, extend elbow, and gross grasp/release. Pt smiling and laughing when seeing her arm move. She bathed overall mod A, assist for positioning LEs into figure four position in order to wash LEs.  She returned to tilt-in-space w/c to dress, independently recalling hemi dressing techniques though requiring assist to execute, mod A to don shirt. She threaded B LEs into pants and donned shoes while seated in figure four position. She stood at sink with mod A sit>stand, max A standing balance with blocking of R knee.  Pt left seated in tilt-in-space w/c at end of session, chair belt alarm on and all needs in reach.   Therapy Documentation Precautions:  Precautions Precautions:  Fall Precaution Comments: right hemiplegia, pushes right Restrictions Weight Bearing Restrictions: No Pain:   No/denies pain   Therapy/Group: Individual Therapy  Donte Kary L 06/03/2018, 7:14 AM

## 2018-06-03 NOTE — Progress Notes (Signed)
Speech Language Pathology Daily Session Note  Patient Details  Name: [name] [name] MRN: [id number] Date of Birth: May 02, 1986  Today's Date: 06/03/2018 SLP Individual Time: 0830-0930 SLP Individual Time Calculation (min): 60 min  Short Term Goals: Week 1: SLP Short Term Goal 1 (Week 1): Pt will initiate verbal response to question within 30 seconds in >25% of opportunities with mod assist multimodal cues.   SLP Short Term Goal 2 (Week 1): Pt will use compensatory strategies for mildly complex word finding during functional tasks with mod assist multimodal cues.   SLP Short Term Goal 3 (Week 1): Pt will sustain her attention to basic, familiar tasks for 5 minutes with mod assist multimodal cues for redirection.   SLP Short Term Goal 4 (Week 1): Pt will complete basic, familiar tasks with mod assist multimodal cues for functional problem solving.    Skilled Therapeutic Interventions:  Skilled treatment session focused on cognition goals. SLP received pt in bed but willing to participate in therapy. SLP facilitated session by promoting recall of previously taught game with supervision cues. Pt able to problem solve game with mildly increased processing times but able to match appropriately. However within conversation, pt with appropriate response times to questions < 30 seconds but with vague/nondescript answers. Pt able to demonstrate selective attention within room but with TV on for ~ 15 minute intervals. Pt mildly fixated on hair (undoing braid) but was able to redirect. Pt was left upright in bed, bed alarm on and all needs within reach. Continue per current plan of care.      Pain Pain Assessment Pain Scale: 0-10 Pain Score: 0-No pain  Therapy/Group: Individual Therapy  Kaiya Boatman 06/03/2018, 10:17 AM

## 2018-06-03 NOTE — Progress Notes (Signed)
Physical Therapy Session Note  Patient Details  Name: [name] [name] MRN: [id number] Date of Birth: Mar 28, 1986  Today's Date: 06/03/2018 PT Individual Time: 1115-1200 and 1415-1500 PT Individual Time Calculation (min): 45 min and 45 min (total 75 min)   Short Term Goals: Week 1:  PT Short Term Goal 1 (Week 1): Pt will perform bed mobility minA PT Short Term Goal 2 (Week 1): Pt will perform transfers modA PT Short Term Goal 3 (Week 1): Pt will perform sit <>stand modA PT Short Term Goal 4 (Week 1): Pt will perform gait x25' maxA +1 PT Short Term Goal 5 (Week 1): Pt will initiate w/c propulsion  Skilled Therapeutic Interventions/Progress Updates: Tx 1: Pt received in w/c; agreeable to treatment time earlier than scheduled. Transported to/from gym totalA in tilt in space w/c. Sit>stand at hall rail minA overall using hall rail. Attempted initiating gait however pt indicated RLE too painful to fully weight bear. Performed x3 reps sit >stand with hall rail; two trials with LUE pushing up from/reaching back for L armrest. Squat pivot to R/L to mat table modA. Sit >supine minA. 2 sets 10 reps bridging with BLE: tactile facilitation for RLE activation. Rolling R/L minA for RLE placement and cues for technique. Sitting R pelvis wedged while performing LUE anterolateral reaching to activate R trunk. WB through R elbow with LUE reaching, throwing to target. Returned to room as above; remained reclined in w/c at end of session, seat belt alarm intact, all needs in reach.   Tx 2: Pt received seated in bed, denies pain and agreeable to treatment.Supine>sit minA for RLE management; pt attempting to move RLE with LUE however unsuccessful. Squat pivot bed <>w/c and w/c <nustep min/modA. Performed BUE/BLE nustep with R hand splint, RLE splint to maintain neutral alignment; 10 min total with occasional short rest breaks. Returned to bed as above. Assisted with scooting toward [facility] minA with bed in trendelenburg.  Remained in bed at end of session, alarm intact, four bedrails per pt request, all needs in reach.      Therapy Documentation Precautions:  Precautions Precautions: Fall Precaution Comments: right hemiplegia, pushes right Restrictions Weight Bearing Restrictions: No Pain: Pain Assessment Pain Scale: 0-10 Pain Score: 0-No pain    Therapy/Group: Individual Therapy  Corliss Skains 06/03/2018, 12:01 PM

## 2018-06-03 NOTE — Progress Notes (Signed)
[place] PHYSICAL MEDICINE & REHABILITATION PROGRESS NOTE  Subjective/Complaints: Patient seen sitting up in bed this morning.  She states she slept well overnight.  She states that a good weekend.  She is vocalizing more.  ROS: Denies CP, shortness of breath, nausea, vomiting, diarrhea.  Objective: Vital Signs: Blood pressure 105/78, pulse 63, temperature 98.2 F (36.8 C), temperature source Oral, resp. rate 18, height 5\' 10"  (1.778 m), weight 87.7 kg, SpO2 97 %, currently breastfeeding. No results found. Recent Labs    06/03/18 0518  WBC 2.8*  HGB 11.3*  HCT 35.4*  PLT 179   Recent Labs    06/03/18 0518  NA 143  K 4.0  CL 106  CO2 27  GLUCOSE 89  BUN 8  CREATININE 0.63  CALCIUM 9.6    Physical Exam: BP 105/78 (BP Location: Left Arm)   Pulse 63   Temp 98.2 F (36.8 C) (Oral)   Resp 18   Ht 5\' 10"  (1.778 m)   Wt 87.7 kg   SpO2 97%   BMI 27.74 kg/m  Constitutional: No distress . Vital signs reviewed.  Obese. HENT: Normocephalic.  Atraumatic. Eyes: EOMI. No discharge. Cardiovascular: RRR. No JVD. Respiratory: CTA Bilaterally. Normal effort. GI: BS +. Non-distended. Musc: No edema or tenderness in extremities. Neurological: She is alert Delayed processing.  Right upper extremity/right lower extremity: 0/5 proximal to distal.  Left upper extremity: 4/5 proximal distal Left lower extremity: 4-/5 proximal distal  Skin: She is not diaphoretic. Crani incision c/d/i  Psychiatric: Improving affect    Assessment/Plan: 1. Functional deficits secondary to left ACA with SAH which require 3+ hours per day of interdisciplinary therapy in a comprehensive inpatient rehab setting.  Physiatrist is providing close team supervision and 24 hour management of active medical problems listed below.  Physiatrist and rehab team continue to assess barriers to discharge/monitor patient progress toward functional and medical goals  Care Tool:  Bathing    Body parts bathed by  patient: Chest, Abdomen, Right arm, Right upper leg, Left upper leg, Face   Body parts bathed by helper: Left arm, Front perineal area, Buttocks, Right lower leg, Left lower leg     Bathing assist Assist Level: 2 Helpers     Upper Body Dressing/Undressing Upper body dressing   What is the patient wearing?: Pull over shirt    Upper body assist Assist Level: Minimal Assistance - Patient > 75%    Lower Body Dressing/Undressing Lower body dressing      What is the patient wearing?: Incontinence brief, Pants     Lower body assist Assist for lower body dressing: 2 Helpers     Toileting Toileting Toileting Activity did not occur (Clothing management and hygiene only): Safety/medical concerns  Toileting assist Assist for toileting: Total Assistance - Patient < 25%     Transfers Chair/bed transfer  Transfers assist  Chair/bed transfer activity did not occur: N/A  Chair/bed transfer assist level: Dependent - mechanical lift(stedy)     Locomotion Ambulation   Ambulation assist      Assist level: 2 helpers Assistive device: Other (comment)(hall rail) Max distance: 25   Walk 10 feet activity   Assist     Assist level: 2 helpers     Walk 50 feet activity   Assist Walk 50 feet with 2 turns activity did not occur: Safety/medical concerns         Walk 150 feet activity   Assist Walk 150 feet activity did not occur: Safety/medical concerns  Walk 10 feet on uneven surface  activity   Assist Walk 10 feet on uneven surfaces activity did not occur: Safety/medical concerns         Wheelchair     Assist Will patient use wheelchair at discharge?: (TBD)   Wheelchair activity did not occur: Safety/medical concerns  Wheelchair assist level: Minimal Assistance - Patient > 75% Max wheelchair distance: 150    Wheelchair 50 feet with 2 turns activity    Assist    Wheelchair 50 feet with 2 turns activity did not occur: Safety/medical  concerns   Assist Level: Minimal Assistance - Patient > 75%   Wheelchair 150 feet activity     Assist Wheelchair 150 feet activity did not occur: Safety/medical concerns   Assist Level: Minimal Assistance - Patient > 75%      Medical Problem List and Plan: 1.  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   -Continue CIR  2.  DVT Prophylaxis/Anticoagulation: Mechanical: Sequential compression devices, below knee Bilateral lower extremities Right peroneal and posterior tibial vein acute DVT- repeat dopplers in 1 wk (12/17)- no anticoag due to [facility], pt is asymptomatic 3. Pain Management: Tylenol prn.  4. Mood: Team to provide ego support.  LCSW to follow for evaluation and support.  5. Neuropsych: This patient is not fully capable of making decisions on her own behalf. 6. Skin/Wound Care: Monitor incision for healing.  7. Fluids/Electrolytes/Nutrition: Monitor I/Os. Offer supplements if intake poor.   BMP normal 12/16 8. HTN: Monitor BP bid--continue amlodipine     Vitals:   06/02/18 1942 06/03/18 0516  BP: 106/66 105/78  Pulse: 75 63  Resp: 16 18  Temp: 98.2 F (36.8 C) 98.2 F (36.8 C)  SpO2: 100% 97%   Relatively low on 12/16, will consider decreasing medications if persistently low 9. Moraxella HCAP: Has completed antibiotic regimen on 12/4 10. ABLA: Continue to monitor H/H serially.   Hemoglobin 11.3 on 12/16  Continue to monitor 11.  Impaired fasting glucose: Likely stress related.   HbA1c 4.3 on 12/10- normal 12.  HypoK - supplement KCL  Potassium 4.0 on 12/16  Continue to monitor 13.  FLexor withdrawal spasms RLE- started tizanidine qhs- cont ROM with PT   LOS: 7 days A FACE TO FACE EVALUATION WAS PERFORMED  [name] [name] [name] [name] 06/03/2018, 9:18 AM

## 2018-06-03 NOTE — Plan of Care (Signed)
  Problem: RH BOWEL ELIMINATION Goal: RH STG MANAGE BOWEL WITH ASSISTANCE Description STG Manage Bowel with Assistance. max  Outcome: Progressing   Problem: RH BLADDER ELIMINATION Goal: RH STG MANAGE BLADDER WITH ASSISTANCE Description STG Manage Bladder With Assistance. Max  Outcome: Progressing   Problem: RH SAFETY Goal: RH STG ADHERE TO SAFETY PRECAUTIONS W/ASSISTANCE/DEVICE Description STG Adhere to Safety Precautions With Assistance/Device. max  Outcome: Progressing   Problem: RH COGNITION-NURSING Goal: RH STG USES MEMORY AIDS/STRATEGIES W/ASSIST TO PROBLEM SOLVE Description STG Uses Memory Aids/Strategies With Assistance to Problem Solve. Max  Outcome: Progressing   Problem: RH KNOWLEDGE DEFICIT Goal: RH STG INCREASE KNOWLEDGE OF HYPERTENSION Description Patient and family will be able to verbalize target blood pressure, how to take blood pressure, and medication for HTN  Outcome: Progressing

## 2018-06-04 ENCOUNTER — Inpatient Hospital Stay (HOSPITAL_COMMUNITY): Payer: Medicaid Other | Admitting: Physical Therapy

## 2018-06-04 ENCOUNTER — Inpatient Hospital Stay (HOSPITAL_COMMUNITY): Payer: Medicaid Other

## 2018-06-04 ENCOUNTER — Inpatient Hospital Stay (HOSPITAL_COMMUNITY): Payer: Medicaid Other | Admitting: Speech Pathology

## 2018-06-04 ENCOUNTER — Inpatient Hospital Stay (HOSPITAL_COMMUNITY): Payer: Medicaid Other | Admitting: Occupational Therapy

## 2018-06-04 DIAGNOSIS — D72819 Decreased white blood cell count, unspecified: Secondary | ICD-10-CM

## 2018-06-04 DIAGNOSIS — I82409 Acute embolism and thrombosis of unspecified deep veins of unspecified lower extremity: Secondary | ICD-10-CM

## 2018-06-04 DIAGNOSIS — I82451 Acute embolism and thrombosis of right peroneal vein: Secondary | ICD-10-CM

## 2018-06-04 MED ORDER — AMLODIPINE BESYLATE 5 MG PO TABS
7.5000 mg | ORAL_TABLET | Freq: Every day | ORAL | Status: DC
  Administered 2018-06-05 – 2018-06-06 (×2): 7.5 mg via ORAL
  Filled 2018-06-04 (×2): qty 1

## 2018-06-04 NOTE — Progress Notes (Signed)
Physical Therapy Weekly Progress Note  Patient Details  Name: [name] [name] MRN: [id number] Date of Birth: 08/22/1985  Beginning of progress report period: May 28, 2018 End of progress report period: June 04, 2018  Today's Date: 06/04/2018 PT Individual Time: 1300-1400 PT Individual Time Calculation (min): 60 min   Patient has met 4 of 5 short term goals.  Pt currently requires minA for bed mobility, modA for squat pivot transfers, minA w/c propulsion, maxA for gait with hall rail. Limited by dense R hemiplegia, decreased attention/safety awareness. Standing/gait training has been limited by pain in R calf at location of DVT. Pt is demonstrating improving communication, safety awareness, and motivation to participate in therapy session.   Patient continues to demonstrate the following deficits muscle weakness and muscle paralysis, decreased cardiorespiratoy endurance, impaired timing and sequencing, unbalanced muscle activation, decreased coordination and decreased motor planning, decreased initiation, decreased problem solving and decreased safety awareness and decreased sitting balance, decreased standing balance, decreased postural control, hemiplegia and decreased balance strategies and therefore will continue to benefit from skilled PT intervention to increase functional independence with mobility.  Patient progressing toward long term goals..  Continue plan of care.  PT Short Term Goals Week 1:  PT Short Term Goal 1 (Week 1): Pt will perform bed mobility minA PT Short Term Goal 1 - Progress (Week 1): Met PT Short Term Goal 2 (Week 1): Pt will perform transfers modA PT Short Term Goal 2 - Progress (Week 1): Met PT Short Term Goal 3 (Week 1): Pt will perform sit <>stand modA PT Short Term Goal 3 - Progress (Week 1): Met PT Short Term Goal 4 (Week 1): Pt will perform gait x25' maxA +1 PT Short Term Goal 4 - Progress (Week 1): Not met PT Short Term Goal 5 (Week 1): Pt will  initiate w/c propulsion PT Short Term Goal 5 - Progress (Week 1): Met Week 2:  PT Short Term Goal 1 (Week 2): Pt will perform gait x25' maxA +1 PT Short Term Goal 2 (Week 2): Pt will perform w/c propulsion x150' with min guard PT Short Term Goal 3 (Week 2): Pt will demo dynamic standing balance x3 min with modA  Skilled Therapeutic Interventions/Progress Updates: Pt received seated in bed, denies pain at rest but evidence of pain later in session during standing. Supine>sitting EOB with minA for RLE management. Sitting EOB, donned pants totalA, minA sit>stand and pt able to pull up pants over R hip. Pt ate lunch seated on EOB with S for balance, occasional mild LOB to R side, able to correct with LUE on bedrail. Squat pivot bed<>w/c with modA. Transported totalA to day room to participate in holiday party. Pt engaged in ornament making task, requesting assist as needed and utilizing one handed techniques for several activities with increased time required. Sit <>stand at L hallrail x1 trial, unable to take step with LLE d/t RLE pain in stance. Returned to room totalA, transfer to bed as above. ModA sit >supine. Remained in bed, alarm intact and all needs in reach.      Therapy Documentation Precautions:  Precautions Precautions: Fall Precaution Comments: right hemiplegia, pushes right Restrictions Weight Bearing Restrictions: No   Therapy/Group: Individual Therapy  Elizabeth J Seigle 06/04/2018, 2:00 PM   

## 2018-06-04 NOTE — Progress Notes (Signed)
[place] PHYSICAL MEDICINE & REHABILITATION PROGRESS NOTE  Subjective/Complaints: Patient seen sitting up in bed this morning.  She states she slept well overnight.  She denies complaints.  Continues to verbalize more and more.  ROS: Denies CP, shortness of breath, nausea, vomiting, diarrhea.  Objective: Vital Signs: Blood pressure 104/80, pulse 85, temperature 98.3 F (36.8 C), temperature source Oral, resp. rate 18, height 5\' 10"  (1.778 m), weight 87 kg, SpO2 97 %, currently breastfeeding. No results found. Recent Labs    06/03/18 0518  WBC 2.8*  HGB 11.3*  HCT 35.4*  PLT 179   Recent Labs    06/03/18 0518  NA 143  K 4.0  CL 106  CO2 27  GLUCOSE 89  BUN 8  CREATININE 0.63  CALCIUM 9.6    Physical Exam: BP 104/80 (BP Location: Left Arm)   Pulse 85   Temp 98.3 F (36.8 C) (Oral)   Resp 18   Ht 5\' 10"  (1.778 m)   Wt 87 kg   SpO2 97%   BMI 27.52 kg/m  Constitutional: No distress . Vital signs reviewed.  Obese. HENT: Normocephalic.  Atraumatic. Eyes: EOMI. No discharge. Cardiovascular: RRR.  No JVD. Respiratory: CTA bilaterally.  Normal effort. GI: BS +. Non-distended. Musc: No edema or tenderness in extremities. Neurological: She is alert Delayed processing, improving.  Right upper extremity/right lower extremity: 0/5 proximal to distal.  Left upper extremity: 4/5 proximal distal Left lower extremity: 4-/5 proximal distal  Skin: She is not diaphoretic. Crani incision c/d/i  Psychiatric: Improving affect    Assessment/Plan: 1. Functional deficits secondary to left ACA with SAH which require 3+ hours per day of interdisciplinary therapy in a comprehensive inpatient rehab setting.  Physiatrist is providing close team supervision and 24 hour management of active medical problems listed below.  Physiatrist and rehab team continue to assess barriers to discharge/monitor patient progress toward functional and medical goals  Care Tool:  Bathing    Body  parts bathed by patient: Chest, Abdomen, Right arm, Right upper leg, Left upper leg, Face, Front perineal area, Right lower leg, Left lower leg   Body parts bathed by helper: Left arm, Buttocks     Bathing assist Assist Level: Moderate Assistance - Patient 50 - 74%     Upper Body Dressing/Undressing Upper body dressing   What is the patient wearing?: Pull over shirt    Upper body assist Assist Level: Moderate Assistance - Patient 50 - 74%    Lower Body Dressing/Undressing Lower body dressing      What is the patient wearing?: Incontinence brief, Pants     Lower body assist Assist for lower body dressing: Maximal Assistance - Patient 25 - 49%     Toileting Toileting Toileting Activity did not occur (Clothing management and hygiene only): Safety/medical concerns  Toileting assist Assist for toileting: Total Assistance - Patient < 25%     Transfers Chair/bed transfer  Transfers assist  Chair/bed transfer activity did not occur: N/A  Chair/bed transfer assist level: Moderate Assistance - Patient 50 - 74%     Locomotion Ambulation   Ambulation assist      Assist level: 2 helpers Assistive device: Other (comment)(hall rail) Max distance: 25   Walk 10 feet activity   Assist     Assist level: 2 helpers     Walk 50 feet activity   Assist Walk 50 feet with 2 turns activity did not occur: Safety/medical concerns         Walk 150  feet activity   Assist Walk 150 feet activity did not occur: Safety/medical concerns         Walk 10 feet on uneven surface  activity   Assist Walk 10 feet on uneven surfaces activity did not occur: Safety/medical concerns         Wheelchair     Assist Will patient use wheelchair at discharge?: (TBD)   Wheelchair activity did not occur: Safety/medical concerns  Wheelchair assist level: Minimal Assistance - Patient > 75% Max wheelchair distance: 150    Wheelchair 50 feet with 2 turns  activity    Assist    Wheelchair 50 feet with 2 turns activity did not occur: Safety/medical concerns   Assist Level: Minimal Assistance - Patient > 75%   Wheelchair 150 feet activity     Assist Wheelchair 150 feet activity did not occur: Safety/medical concerns   Assist Level: Minimal Assistance - Patient > 75%      Medical Problem List and Plan: 1.  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   -Continue CIR  2.  DVT Prophylaxis/Anticoagulation: Mechanical: Sequential compression devices, below knee Bilateral lower extremities Right peroneal and posterior tibial vein acute DVT- will order repeat dopplers - no anticoag due to [facility], pt is asymptomatic 3. Pain Management: Tylenol prn.  4. Mood: Team to provide ego support.  LCSW to follow for evaluation and support.  5. Neuropsych: This patient is not fully capable of making decisions on her own behalf. 6. Skin/Wound Care: Monitor incision for healing.  7. Fluids/Electrolytes/Nutrition: Monitor I/Os. Offer supplements if intake poor.   BMP normal 12/16 8. HTN: Monitor BP bid  Amlodipine decreased to 7.5 on 12/18    Vitals:   06/03/18 1949 06/04/18 0505  BP: 107/76 104/80  Pulse: 82 85  Resp: 18 18  Temp: 98 F (36.7 C) 98.3 F (36.8 C)  SpO2: 99% 97%   Relatively low on 12/17 9. [place] HCAP: Has completed antibiotic regimen on 12/4 10. ABLA: Continue to monitor H/H serially.   Hemoglobin 11.3 on 12/16  Continue to monitor 11.  Impaired fasting glucose: Likely stress related.   HbA1c 4.3 on 12/10- normal 12.  HypoK - supplement KCL  Potassium 4.0 on 12/16  Continue to monitor 13.  FLexor withdrawal spasms RLE- started tizanidine qhs- cont ROM with PT 14.  Leukopenia  WBCs 2.8 on 12/16  Continue to monitor   LOS: 8 days A FACE TO FACE EVALUATION WAS PERFORMED  Najae Rathert Lorie Phenix 06/04/2018, 8:13 AM

## 2018-06-04 NOTE — Progress Notes (Signed)
Speech Language Pathology Weekly Progress and Session Note  Patient Details  Name: [name] [name] MRN: [id number] Date of Birth: Feb 14, 1986  Beginning of progress report period: 05/28/2018 End of progress report period: 06/04/2018  Today's Date: 06/04/2018 SLP Individual Time: 0806-0905 SLP Individual Time Calculation (min): 59 min  Short Term Goals: Week 1: SLP Short Term Goal 1 (Week 1): Pt will initiate verbal response to question within 30 seconds in >25% of opportunities with mod assist multimodal cues.   SLP Short Term Goal 1 - Progress (Week 1): Met SLP Short Term Goal 2 (Week 1): Pt will use compensatory strategies for mildly complex word finding during functional tasks with mod assist multimodal cues.   SLP Short Term Goal 2 - Progress (Week 1): Met SLP Short Term Goal 3 (Week 1): Pt will sustain her attention to basic, familiar tasks for 5 minutes with mod assist multimodal cues for redirection.   SLP Short Term Goal 3 - Progress (Week 1): Met SLP Short Term Goal 4 (Week 1): Pt will complete basic, familiar tasks with mod assist multimodal cues for functional problem solving.   SLP Short Term Goal 4 - Progress (Week 1): Met    New Short Term Goals: Week 2: SLP Short Term Goal 1 (Week 2): Pt will use compensatory strategies for mildly complex word finding during functional tasks with min assist multimodal cues.   SLP Short Term Goal 2 (Week 2): Pt will complete basic, familiar tasks with min assist multimodal cues for functional problem solving.   SLP Short Term Goal 3 (Week 2): Pt will sustain her attention to basic, familiar tasks for 15 minutes with min assist multimodal cues for redirection.    Weekly Progress Updates:   Pt has made excellent gains this reporting period and has met 4 out of 4 short term goals.  Pt is currently min-mod assist for tasks due to mild cognitive-linguistic deficits.  Pt has demonstrated improved initiation of tasks and verbalizations, sustained  attention to task, word finding, and functional problem solving. Pt education is ongoing.  Pt would continue to benefit from skilled ST while inpatient in order to maximize functional independence and reduce burden of care prior to discharge.  Anticipate that pt will need SNF placement at discharge in addition to ST follow up at next level of care.     Intensity: Minumum of 1-2 x/day, 30 to 90 minutes Frequency: 3 to 5 out of 7 days Duration/Length of Stay: 21-28 days  Treatment/Interventions: Cognitive remediation/compensation;Cueing hierarchy;Environmental controls;Functional tasks;Internal/external aids;Patient/family education   Daily Session  Skilled Therapeutic Interventions: Pt was seen for skilled ST targeting cognitive goals.  SLP facilitated the session with medication management tasks to address problem solving and recall.  Pt was able to load pills into a medication chart according to label instructions with supervision for 100% accuracy.  She was also able to recall function and frequency of medications when named in 5 out of 7 opportunities.  The 5 medications that she knew were familiar to her from prior to admission whereas the 2 medications that she did not know were new.  Pt was able to then organize pills into a pill box for 100% accuracy with increased time.  Task was not completed due to time constraints but will plan to complete task during another scheduled therapy appointment.  Pt was returned to room and left in wheelchair with seat belt alarm donned and call bell within reach.  Goals updated on this date to reflect current progress and  plan of care.       General    Pain Pain Assessment Pain Scale: 0-10 Pain Score: 0-No pain  Therapy/Group: Individual Therapy  [name] [name], [name] [name] 06/04/2018, 10:03 AM

## 2018-06-04 NOTE — Progress Notes (Signed)
Occupational Therapy Weekly Progress Note  Patient Details  Name: [name] [name] MRN: [id number] Date of Birth: 1985/12/23  Beginning of progress report period: May 28, 2018 End of progress report period: June 04, 2018  Today's Date: 06/04/2018 OT Individual Time: 0945-1100 OT Individual Time Calculation (min): 75 min    Patient has met 3 of 3 short term goals.  Pt is making steady progress towards OT goals. She cont to be most limited by dense R hemiplegia, though return in R UE is progressing, however, difficult to use at functional level due to pt's poor attention to R UE/LE. She requires overall min A for dynamic sitting balance. She can be impulsive with mobility with lack of insight into deficits, resulting in fall during therapy session when pt seated in w/c and reaching outside BOS towards R and falling out of w/c. She can complete basic transfers via mod A squat pivot, using STEDY lift for functional transfers into shower and onto [facility] due to pt's inconsistency and need for proper set-up of environment for sucesss.   Patient continues to demonstrate the following deficits: abnormal posture, cognitive deficits, flaccid hemiplegia and hemiparesis, hemiplegia affecting dominant side and muscle weakness (generalized) and therefore will continue to benefit from skilled OT intervention to enhance overall performance with BADL and Reduce care partner burden.  Patient progressing toward long term goals..  Continue plan of care.  OT Short Term Goals Week 1:  OT Short Term Goal 1 (Week 1): Pt will consistently complete basic transfers with +1 assist in order to reduce caregiver burden OT Short Term Goal 1 - Progress (Week 1): Met OT Short Term Goal 2 (Week 1): Pt will recall hemi-dressing technique during UB dressing with min cuing OT Short Term Goal 2 - Progress (Week 1): Met OT Short Term Goal 3 (Week 1): Pt will complete sit>stand at sink with +1 assist during LB dressing task in  order to reduce caregiver burden OT Short Term Goal 3 - Progress (Week 1): Met Week 2:  OT Short Term Goal 1 (Week 2): Pt will complete squat pivot transfer to toilet/BSC with +1 assist OT Short Term Goal 2 (Week 2): Pt will stand to complete 1 grooming task at sink with max A standing balance in order to address functional standing balance/endurance OT Short Term Goal 3 (Week 2): Pt will initiate use of R UE during functional task with min cuing OT Short Term Goal 4 (Week 2): Pt will complete sit>stand from EOB with mod A +1 during dressing routine  Skilled Therapeutic Interventions/Progress Updates:    Pt seen for OT session focusing on neuro re-ed with R UE through weightbearing, ROM, and reciprocal movements. Pt sitting up in tilt-in-space w/c upon arriva, denying pain at rest and agreeable to tx session. Pt declining need for bathing/dressing and agreeable to going to gym to work. Pt taken to therapy gy total A in w/c for time and energy conservation. Throughout session, completed squat pivot transfers w/c<>EOM to R and L, mod A with increased time for task intiiation required. Pt able to complete anterior weightshift and push through LEs in order to power into modified squat with assist to pivot hips.  Pt positioned in supine on mat. Addressed UE ROM with bi-lateral tasks, pt demonstrates elbow flexion/extension abilities with R UE, min A for stabilization, when used simultaneously, however, not able to isolate movement when using R UE only. VCs for attention to task in moderately stimulating environment.  Transferred to sitting EOM, completed R  UE weightbearing, having pt lateral lean on R forearm and push through to return to midline. Min A overall with assist and cuing for technique and motor planning.  Completed x3 sets of 5 sit>squat from EOM focusing on anterior weight shift and pushing through B LEs in order to power into modified stand. Min A overall with increased time for motor planning.   Returned to w/c from and taken back to room, squat pivot to return to EOB, mod A overall for both transfers with mod cuing. Pt left in supine at end of session with all needs in reach and bed alarm on.   Therapy Documentation Precautions:  Precautions Precautions: Fall Precaution Comments: right hemiplegia, pushes right Restrictions Weight Bearing Restrictions: No Pain:   No/denies pain   Therapy/Group: Individual Therapy  Maddalynn Barnard L 06/04/2018, 6:58 AM

## 2018-06-04 NOTE — Progress Notes (Signed)
RLE venous duplex       has been completed. Preliminary results can be found under CV proc through chart review. Armoni Kludt, BS, RDMS, RVT   

## 2018-06-05 ENCOUNTER — Inpatient Hospital Stay (HOSPITAL_COMMUNITY): Payer: Medicaid Other | Admitting: Occupational Therapy

## 2018-06-05 ENCOUNTER — Inpatient Hospital Stay (HOSPITAL_COMMUNITY): Payer: Medicaid Other

## 2018-06-05 ENCOUNTER — Inpatient Hospital Stay (HOSPITAL_COMMUNITY): Payer: Medicaid Other | Admitting: Speech Pathology

## 2018-06-05 ENCOUNTER — Inpatient Hospital Stay (HOSPITAL_COMMUNITY): Payer: Medicaid Other | Admitting: Physical Therapy

## 2018-06-05 DIAGNOSIS — I82401 Acute embolism and thrombosis of unspecified deep veins of right lower extremity: Secondary | ICD-10-CM

## 2018-06-05 DIAGNOSIS — I69351 Hemiplegia and hemiparesis following cerebral infarction affecting right dominant side: Secondary | ICD-10-CM

## 2018-06-05 NOTE — Progress Notes (Signed)
Occupational Therapy Session Note  Patient Details  Name: [name] [name] MRN: [id number] Date of Birth: Jul 03, 1985  Today's Date: 06/05/2018 OT Individual Time: 1000-1100 OT Individual Time Calculation (min): 60 min    Short Term Goals: Week 2:  OT Short Term Goal 1 (Week 2): Pt will complete squat pivot transfer to toilet/BSC with +1 assist OT Short Term Goal 2 (Week 2): Pt will stand to complete 1 grooming task at sink with max A standing balance in order to address functional standing balance/endurance OT Short Term Goal 3 (Week 2): Pt will initiate use of R UE during functional task with min cuing OT Short Term Goal 4 (Week 2): Pt will complete sit>stand from EOB with mod A +1 during dressing routine  Skilled Therapeutic Interventions/Progress Updates:    Pt seen for OT ADL bathing/dressing session. Pt in supine upon arrival, denied pain at rest and requesting to shower. She gathered clothing items from beld level when presented with duffle bag. She transferred to sitting EOB with mod A using hospital bed functions. STEDY used to transfer into shower, pt able to stand from slightly elevated EOB into STEDY with CGA.She bathed seated on BSC, mod A to use R UE to assist with bathing of L UE demonstrating improvements in elbow extension. She required min-mod A +2 to stand from [facility] into STEDY, demonstrates significant motor planning deficits when completing transfer in shower environment vs. Other places.  She returned to tilt-in-space w/c via STEDY. Dressed seated in STEDY requiring cuing to implement hemi-dressing techniques though she was able to verbalize them correctly. Mod A UB dressing, total A LB dressing for time management, sit>stand in STEDY with min A to have pants pulled up. Pt left seated in tilt-in-space w/c at end of session, chair belt alarm donned and all needs in reach.   Therapy Documentation Precautions:  Precautions Precautions: Fall Precaution Comments: right  hemiplegia, pushes right Restrictions Weight Bearing Restrictions: No Pain:   No/denies pain   Therapy/Group: Individual Therapy  Nikolaos Maddocks L 06/05/2018, 7:13 AM

## 2018-06-05 NOTE — Progress Notes (Signed)
Speech Language Pathology Daily Session Note  Patient Details  Name: [name] [name] MRN: [id number] Date of Birth: 02/22/1986  Today's Date: 06/05/2018 SLP Individual Time: 1638-4665 SLP Individual Time Calculation (min): 25 min  Short Term Goals: Week 2: SLP Short Term Goal 1 (Week 2): Pt will use compensatory strategies for mildly complex word finding during functional tasks with min assist multimodal cues.   SLP Short Term Goal 2 (Week 2): Pt will complete basic, familiar tasks with min assist multimodal cues for functional problem solving.   SLP Short Term Goal 3 (Week 2): Pt will sustain her attention to basic, familiar tasks for 15 minutes with min assist multimodal cues for redirection.    Skilled Therapeutic Interventions:  Pt was seen for skilled ST targeting cognitive goals.  SLP facilitated the session with ongoing medication management practice to address problem solving goals.  Pt organized the remainder of her medications into a twice daily pill box for 100% accuracy with mod I identification and correction of errors.  Pt selectively attended to task for its duration in a moderately distracting environment (TV on, door to room open) with no cues needed for redirection.  Pt was left in bed with bed alarm set and call bell within reach.  Continue per current plan of care.    Pain Pain Assessment Pain Scale: 0-10 Pain Score: 0-No pain  Therapy/Group: Individual Therapy  Baili Stang, Selinda [place] 06/05/2018, 8:34 AM

## 2018-06-05 NOTE — Consult Note (Addendum)
Hospital Consult    Reason for Consult:  DVT right leg Requesting Physician:  Barnett Applebaum MRN #:  [id number]  History of Present Illness: This is a 32 y.o. 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  She underwent craniotomy with aneurysm clipping.  Post operatively, she developed right sided weakness and elevated blood pressure requiring Cleviprex.  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  She remains in CIR.  She had a duplex of her right leg on 05/28/18, which revealed acute DVT involving the right posterior tibial and right peroneal vein.  No cystic structure was identified.  On 06/04/18, a repeat duplex revealed the previous DVT was still there and there was a new cystic structure in the popliteal fossa.  There had not been any propagation of the thrombus.    Given the DVT and new cystic structure, VVS is consulted for recommendations.  She states that she does have pain behind her knee but not down in her calf area.  She states that it is painful when she stands or touched.  It is not painful laying down or sitting.  She states it has started in the past couple of weeks.   Past Medical History:  Diagnosis Date  . Asthma, allergic    uses inhaler prn - infrequently  . Bilateral thoracic back pain 09/13/2015  . Eczema   . Hypertension   . Lactose intolerance   . Mature cystic teratoma of ovary   . Mood swings 06/01/2015  . Seasonal allergies     Past Surgical History:  Procedure Laterality Date  . 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 IR 3D INDEPENDENT WKST  05/10/2018  . IR ANGIO INTRA EXTRACRAN SEL INTERNAL CAROTID BILAT MOD SED  05/10/2018  . IR ANGIO VERTEBRAL SEL VERTEBRAL BILAT MOD SED   05/10/2018  . RADIOLOGY WITH ANESTHESIA N/A 05/10/2018   Procedure: EMBOLIZATION OF ANEURSYM;  Surgeon: Consuella Lose, MD;  Location: [place];  Service: Radiology;  Laterality: N/A;  . TUBAL LIGATION N/A 12/15/2016   Procedure: POST PARTUM TUBAL LIGATION;  Surgeon: Mora Bellman, MD;  Location: [facility];  Service: Gynecology;  Laterality: N/A;  . WISDOM TOOTH EXTRACTION      Allergies  Allergen Reactions  . Sulfa Antibiotics Swelling    Causes swelling on the face.  . Shellfish Allergy Other (See Comments)    Stomach upset    Prior to Admission medications   Medication Sig Start Date End Date Taking? Authorizing Provider  amLODipine (NORVASC) 10 MG tablet Take 1 tablet (10 mg total) by mouth daily. To lower blood pressure 04/12/18   Fulp, Cammie, MD  cetirizine (ZYRTEC) 10 MG tablet TAKE 1 TABLET BY MOUTH ONCE DAILY Patient taking differently: Take 10 mg by mouth daily.  03/07/18   Charlott Rakes, MD  ibuprofen (ADVIL,MOTRIN) 600 MG tablet Take 1 tablet (600 mg total) by mouth every 6 (six) hours. X 1week then prn pain 10/25/17   Freeman Caldron M, PA-C  montelukast (SINGULAIR) 10 MG tablet TAKE 1 TABLET BY MOUTH ONCE DAILY AT BEDTIME Patient taking differently: Take 10 mg by mouth at bedtime.  03/07/18   Charlott Rakes, MD  niMODipine (NIMOTOP) 30 MG capsule Take 2 capsules (60 mg total) by mouth every 4 (  four) hours for 3 days. 05/27/18 05/30/18  Costella, Vista Mink, PA-C  omeprazole (PRILOSEC) 20 MG capsule TAKE 1 CAPSULE BY MOUTH ONCE DAILY Patient taking differently: Take 20 mg by mouth daily.  03/07/18   Charlott Rakes, MD    Social History   Socioeconomic History  . Marital status: Single    Spouse name: Not on file  . Number of children: Not on file  . Years of education: Not on file  . Highest education level: Not on file  Occupational History  . Not on file  Social Needs  . Financial resource strain: Not on file  . Food insecurity:    Worry: Not on file     Inability: Not on file  . Transportation needs:    Medical: Not on file    Non-medical: Not on file  Tobacco Use  . Smoking status: Current Every Day Smoker    Packs/day: 0.25    Types: Cigarettes  . Smokeless tobacco: Former Network engineer and Sexual Activity  . Alcohol use: No  . Drug use: No  . Sexual activity: Not Currently    Birth control/protection: None  Lifestyle  . Physical activity:    Days per week: Not on file    Minutes per session: Not on file  . Stress: Not on file  Relationships  . Social connections:    Talks on phone: Not on file    Gets together: Not on file    Attends religious service: Not on file    Active member of club or organization: Not on file    Attends meetings of clubs or organizations: Not on file    Relationship status: Not on file  . Intimate partner violence:    Fear of current or ex partner: Not on file    Emotionally abused: Not on file    Physically abused: Not on file    Forced sexual activity: Not on file  Other Topics Concern  . Not on file  Social History Narrative  . Not on file     Family History  Adopted: Yes  Problem Relation Age of Onset  . Hypertension Father   . Hypertension Maternal Aunt   . Hypertension Maternal Uncle   . Hypertension Maternal Grandmother   . Eczema Maternal Grandmother   . Hypertension Maternal Grandfather   . Hypertension Paternal Grandmother   . Hypertension Mother   . Eczema Mother     ROS: [x]  Positive   [ ]  Negative   [ ]  All sytems reviewed and are negative  Cardiac: []  chest pain/pressure []  palpitations []  SOB lying flat []  DOE  Vascular: [x]  pain behind her right knee [x]  hx of DVT []  swelling in legs  Pulmonary: [x]  HCAP  Neurologic: [x]  SAH [x]  right hemiparesis   Hematologic: []  hx of cancer []  bleeding problems []  problems with blood clotting easily  Endocrine:   []  diabetes []  thyroid disease  GI [x]  GERD  GU: []  CKD/renal failure []  HD--[]  M/W/F or  []  T/T/S  Psychiatric: []  anxiety []  depression  Musculoskeletal: [x]  posterior knee pain right leg  Integumentary: []  rashes []  ulcers  Constitutional: []  fever []  chills   Physical Examination  Vitals:   06/05/18 0609 06/05/18 0730  BP: 97/65 100/63  Pulse: 77   Resp: 17   Temp: 98.6 F (37 C)   SpO2: 100%    Body mass index is 27.52 kg/m.  General:  WDWN in NAD Gait: Not observed HENT: WNL, normocephalic  Pulmonary: normal non-labored breathing, without Rales, rhonchi,  wheezing Cardiac: regular, without  Murmurs, rubs or gallops; without carotid bruits Skin: without rashes Vascular Exam/Pulses:  Right Left  Radial 2+ (normal) 2+ (normal)  AT Unable to palpate Unable to palpate  PT 2+ (normal) 1+ (weak)   Extremities: there is a cystic mass posterior knee that is painful to palpation; calf without pain with palpation bilaterally Musculoskeletal: no muscle wasting or atrophy  Neurologic: right hemiplegia Psychiatric:  The pt has Normal affect.   CBC    Component Value Date/Time   WBC 2.8 (L) 06/03/2018 0518   RBC 3.62 (L) 06/03/2018 0518   HGB 11.3 (L) 06/03/2018 0518   HGB 11.0 (L) 09/25/2016 0902   HCT 35.4 (L) 06/03/2018 0518   HCT 33.9 (L) 09/25/2016 0902   PLT 179 06/03/2018 0518   PLT 198 09/25/2016 0902   MCV 97.8 06/03/2018 0518   MCV 98 (H) 09/25/2016 0902   MCH 31.2 06/03/2018 0518   MCHC 31.9 06/03/2018 0518   RDW 12.2 06/03/2018 0518   RDW 12.9 09/25/2016 0902   LYMPHSABS 1.4 05/28/2018 0614   MONOABS 0.4 05/28/2018 0614   EOSABS 0.1 05/28/2018 0614   BASOSABS 0.0 05/28/2018 0614    BMET    Component Value Date/Time   NA 143 06/03/2018 0518   NA 142 08/01/2017 0934   K 4.0 06/03/2018 0518   CL 106 06/03/2018 0518   CO2 27 06/03/2018 0518   GLUCOSE 89 06/03/2018 0518   GLUCOSE 70 08/25/2014 1214   BUN 8 06/03/2018 0518   BUN 6 08/01/2017 0934   CREATININE 0.63 06/03/2018 0518   CREATININE 0.40 (L) 06/29/2016 1504    CALCIUM 9.6 06/03/2018 0518   GFRNONAA >60 06/03/2018 0518   GFRNONAA >89 07/02/2015 0945   GFRAA >60 06/03/2018 0518   GFRAA >89 07/02/2015 0945    COAGS: Lab Results  Component Value Date   INR 1.00 05/09/2018     Non-Invasive Vascular Imaging:   See HPI  Statin:  No. Beta Blocker:  No. Aspirin:  No. ACEI:  No. ARB:  No. CCB use:  Yes Other antiplatelets/anticoagulants:  No.    ASSESSMENT/PLAN: This is a 32 y.o. female with recent [place] and craniotomy for clipping on aneurysm and hydrocephalus and right hemiplegia post operatively now with DVT right posterior tibial and peroneal vein with new cystic structure in popliteal fossa.    -pt with acute DVT in the right posterior tibial and peroneal vein.  Repeat duplex yesterday revealed there has been no propagation of the thrombus.  Recommend repeat duplex in a couple of weeks.  If she does have propagation of thrombus, she may need an IVC filter since she is not a candidate for anticoagulation. -she does have a cystic mass in the popliteal fossa on the right that is painful with standing and palpation.  May benefit from orthopedics consult for possible I&D. -Dr. Trula Slade to see pt this afternoon.   Leontine Locket, PA-C [facility] [phone]   I agree with the above.  I have seen and evaluated the patient.  Briefly, this is a 32 year old female with recent history of subarachnoid hemorrhage.  She underwent craniotomy and clipping of an aneurysm.  She developed a posterior tibial and peroneal DVT on the right as documented by ultrasound on 05/28/2018.  She is unable to be anticoagulated.  She had a repeat ultrasound yesterday which did not show any propagation of the thrombus.  However, she was found to  have a cystic structure in the popliteal fossa which was not visualized on her initial study.  Currently, she has a swollen right leg and describes pain behind her knee and into her calf.  There is now concern for  a popliteal cyst.  I think this would be better evaluated with an MRI of the leg which I will order.  She is currently not anticoagulated and has not shown any propagation of her DVT.  I think a repeat venous Doppler study is warranted again in another week to make sure there is been no propagation.  We will follow-up after her the MRI of her leg is been performed.  Annamarie Major

## 2018-06-05 NOTE — Progress Notes (Signed)
Occupational Therapy Session Note  Patient Details  Name: [name] [name] MRN: [id number] Date of Birth: 1986-02-26  Today's Date: 06/05/2018 OT Individual Time: 8721-5872 OT Individual Time Calculation (min): 41 min    Short Term Goals: Week 1:  OT Short Term Goal 1 (Week 1): Pt will consistently complete basic transfers with +1 assist in order to reduce caregiver burden OT Short Term Goal 1 - Progress (Week 1): Met OT Short Term Goal 2 (Week 1): Pt will recall hemi-dressing technique during UB dressing with min cuing OT Short Term Goal 2 - Progress (Week 1): Met OT Short Term Goal 3 (Week 1): Pt will complete sit>stand at sink with +1 assist during LB dressing task in order to reduce caregiver burden OT Short Term Goal 3 - Progress (Week 1): Met  Skilled Therapeutic Interventions/Progress Updates:    1;1. Focus of session on R attention and RUE NMR while seated in TIS. Pt transported to/from all tx destinations with total A for time management. Pt completes tabletop activites of water transport from 1 basin grasping wash cloth and squeezing water out with MAX A for reach and min A for facilitating exaggerated squeeze of water into other basin. Pt completes reps of reaching for cup, grasping, bringing to mouth and placing on table with MAX A overall for shoulder/elbow movements. Pt demo actvation of grasp and release as well as ulnar/radial deviation. Pt grasps for blocks and reaches with MAX A to place into cup on R side for R attention. Exited session with pt seated in TIS, tilted back and belt alarm on with call light in reach  Therapy Documentation Precautions:  Precautions Precautions: Fall Precaution Comments: right hemiplegia, pushes right Restrictions Weight Bearing Restrictions: No General:   Vital Signs:     Therapy/Group: Individual Therapy  Tonny Branch 06/05/2018, 3:00 PM

## 2018-06-05 NOTE — Progress Notes (Signed)
PHYSICAL MEDICINE & REHABILITATION PROGRESS NOTE  Subjective/Complaints: Patient seen laying in bed this morning.  She states she slept well overnight.  She denies complaints.  Discussed with nursing regarding overnight bracing.  ROS: Denies CP, shortness of breath, nausea, vomiting, diarrhea.  Objective: Vital Signs: Blood pressure 100/63, pulse 77, temperature 98.6 F (37 C), temperature source Oral, resp. rate 17, height 5\' 10"  (1.778 m), weight 87 kg, SpO2 100 %, currently breastfeeding. Vas [name] Lower Extremity Venous (dvt)  Result Date: 06/04/2018  Lower Venous Study Indications: Follow up DVT 05/28/18.  Performing Technologist: Landry Mellow RDMS, RVT  Examination Guidelines: A complete evaluation includes B-mode imaging, spectral Doppler, color Doppler, and power Doppler as needed of all accessible portions of each vessel. Bilateral testing is considered an integral part of a complete examination. Limited examinations for reoccurring indications may be performed as noted.  Right Venous Findings: +---------+---------------+---------+-----------+----------+-----------------+          CompressibilityPhasicitySpontaneityPropertiesSummary           +---------+---------------+---------+-----------+----------+-----------------+ CFV      Full           Yes      Yes                                    +---------+---------------+---------+-----------+----------+-----------------+ SFJ      Full                                                           +---------+---------------+---------+-----------+----------+-----------------+ FV Prox  Full                                                           +---------+---------------+---------+-----------+----------+-----------------+ FV Mid   Full                                                           +---------+---------------+---------+-----------+----------+-----------------+ FV DistalFull                                                            +---------+---------------+---------+-----------+----------+-----------------+ PFV      Full                                                           +---------+---------------+---------+-----------+----------+-----------------+ POP      Full           Yes      Yes                                    +---------+---------------+---------+-----------+----------+-----------------+  PTV      Partial                                      Age Indeterminate +---------+---------------+---------+-----------+----------+-----------------+ PERO     None                                         Age Indeterminate +---------+---------------+---------+-----------+----------+-----------------+  Left Venous Findings: +---+---------------+---------+-----------+----------+-------+    CompressibilityPhasicitySpontaneityPropertiesSummary +---+---------------+---------+-----------+----------+-------+ CFVFull           Yes      Yes                          +---+---------------+---------+-----------+----------+-------+    Summary: Right: Findings consistent with age indeterminate deep vein thrombosis involving the right posterior tibial vein, and right peroneal vein. A cystic structure is found in the popliteal fossa. No propagation of thrombus since last exam. Left: No evidence of common femoral vein obstruction.  *See table(s) above for measurements and observations. Electronically signed by Deitra Mayo MD on 06/04/2018 at 4:35:01 PM.    Final    Recent Labs    06/03/18 0518  WBC 2.8*  HGB 11.3*  HCT 35.4*  PLT 179   Recent Labs    06/03/18 0518  NA 143  K 4.0  CL 106  CO2 27  GLUCOSE 89  BUN 8  CREATININE 0.63  CALCIUM 9.6    Physical Exam: BP 100/63   Pulse 77   Temp 98.6 F (37 C) (Oral)   Resp 17   Ht 5\' 10"  (1.778 m)   Wt 87 kg   SpO2 100%   BMI 27.52 kg/m  Constitutional: No distress . Vital signs reviewed.   Obese. HENT: Normocephalic.  Atraumatic. Eyes: EOMI. No discharge. Cardiovascular: RRR.  No JVD. Respiratory: CTA bilaterally.  Normal effort. GI: BS +. Non-distended. Musc: No edema or tenderness in extremities. Neurological: She is alert Delayed processing, improving.  Right upper extremity/right lower extremity: 0/5 proximal to distal, stable. Increased tone in right hamstrings Left upper extremity: 4/5 proximal distal Left lower extremity: 4-/5 proximal distal  Skin: She is not diaphoretic. Crani incision c/d/i  Psychiatric: Improving affect    Assessment/Plan: 1. Functional deficits secondary to left ACA with SAH which require 3+ hours per day of interdisciplinary therapy in a comprehensive inpatient rehab setting.  Physiatrist is providing close team supervision and 24 hour management of active medical problems listed below.  Physiatrist and rehab team continue to assess barriers to discharge/monitor patient progress toward functional and medical goals  Care Tool:  Bathing    Body parts bathed by patient: Chest, Abdomen, Right arm, Right upper leg, Left upper leg, Face, Front perineal area, Right lower leg, Left lower leg   Body parts bathed by helper: Left arm, Buttocks     Bathing assist Assist Level: Moderate Assistance - Patient 50 - 74%     Upper Body Dressing/Undressing Upper body dressing   What is the patient wearing?: Pull over shirt    Upper body assist Assist Level: Moderate Assistance - Patient 50 - 74%    Lower Body Dressing/Undressing Lower body dressing      What is the patient wearing?: Incontinence brief, Pants     Lower body assist Assist for lower body dressing: Maximal Assistance -  Patient 25 - 49%     Toileting Toileting Toileting Activity did not occur Landscape architect and hygiene only): Safety/medical concerns  Toileting assist Assist for toileting: Total Assistance - Patient < 25%     Transfers Chair/bed transfer  Transfers  assist  Chair/bed transfer activity did not occur: N/A  Chair/bed transfer assist level: Moderate Assistance - Patient 50 - 74%     Locomotion Ambulation   Ambulation assist      Assist level: 2 helpers Assistive device: Other (comment)(hall rail) Max distance: 25   Walk 10 feet activity   Assist     Assist level: 2 helpers     Walk 50 feet activity   Assist Walk 50 feet with 2 turns activity did not occur: Safety/medical concerns         Walk 150 feet activity   Assist Walk 150 feet activity did not occur: Safety/medical concerns         Walk 10 feet on uneven surface  activity   Assist Walk 10 feet on uneven surfaces activity did not occur: Safety/medical concerns         Wheelchair     Assist Will patient use wheelchair at discharge?: (TBD)   Wheelchair activity did not occur: Safety/medical concerns  Wheelchair assist level: Minimal Assistance - Patient > 75% Max wheelchair distance: 150    Wheelchair 50 feet with 2 turns activity    Assist    Wheelchair 50 feet with 2 turns activity did not occur: Safety/medical concerns   Assist Level: Minimal Assistance - Patient > 75%   Wheelchair 150 feet activity     Assist Wheelchair 150 feet activity did not occur: Safety/medical concerns   Assist Level: Minimal Assistance - Patient > 75%      Medical Problem List and Plan: 1.  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   -Continue CIR  2.  DVT Prophylaxis/Anticoagulation: Mechanical: Sequential compression devices, below knee Bilateral lower extremities Right peroneal and posterior tibial vein acute DVT-repeat Dopplers suggesting persistent DVT with?  Cystic structure, will follow-up regarding further recommendations 3. Pain Management: Tylenol prn.  4. Mood: Team to provide ego support.  LCSW to follow for evaluation and support.  5. Neuropsych: This  patient is not fully capable of making decisions on her own behalf. 6. Skin/Wound Care: Monitor incision for healing.  7. Fluids/Electrolytes/Nutrition: Monitor I/Os. Offer supplements if intake poor.   BMP normal 12/16 8. HTN: Monitor BP bid  Amlodipine decreased to 7.5 on 12/18    Vitals:   06/05/18 0609 06/05/18 0730  BP: 97/65 100/63  Pulse: 77   Resp: 17   Temp: 98.6 F (37 C)   SpO2: 100%    Relatively low on 12/18 9. Moraxella HCAP: Has completed antibiotic regimen on 12/4 10. ABLA: Continue to monitor H/H serially.   Hemoglobin 11.3 on 12/16  Continue to monitor 11.  Impaired fasting glucose: Likely stress related.   HbA1c 4.3 on 12/10- normal 12.  HypoK - supplement KCL  Potassium 4.0 on 12/16  Continue to monitor 13.  Spastic hemiplegia  Right lower extremity with increased hamstring tone  Continue tizanidine qhs  Cont ROM   Educated regarding appropriate positioning 14.  Leukopenia  WBCs 2.8 on 12/16  Labs ordered for tomorrow  Continue to monitor   LOS: 9 days A FACE TO FACE EVALUATION WAS PERFORMED  Tashiana Lamarca Lorie Phenix 06/05/2018, 8:28 AM

## 2018-06-05 NOTE — Progress Notes (Signed)
Physical Therapy Session Note  Patient Details  Name: Fiza Nation MRN: [id number] Date of Birth: [birth year]  Today's Date: 06/05/2018 PT Individual Time: 1300-1400 PT Individual Time Calculation (min): 60 min   Short Term Goals: Week 2:  PT Short Term Goal 1 (Week 2): Pt will perform gait x25' maxA +1 PT Short Term Goal 2 (Week 2): Pt will perform w/c propulsion x150' with min guard PT Short Term Goal 3 (Week 2): Pt will demo dynamic standing balance x3 min with modA  Skilled Therapeutic Interventions/Progress Updates: Pt received seated in w/c, denies pain and agreeable to treatment. Transported to/from gym [place]. Sit >stand x2 trials at [place]; again unable to initiate stepping with LLE d/t pain in RLE, pt indicated pain in posterior fossa. Sit >stand in stedy with minA. Performed multiple sit <>stands, sitting in high perch while engaged in [place] forced use task of peg board. Requires max encouragement and cues for use of RUE with increased time and therapist assist for shoulder AAROM to place pegs. Engaged in tic tac toe and hangman game at sit <>stand in stedy for continued forced use and WB through RLE, midline orientation and standing balance. Squat pivot to L with minA, increased time required to initiate and perform. Returned to room totalA; remained semi-reclined in w/c at end of session, quick release belt intact and all needs in reach.      Therapy Documentation Precautions:  Precautions Precautions: Fall Precaution Comments: right hemiplegia, pushes right Restrictions Weight Bearing Restrictions: No    Therapy/Group: Individual Therapy  Corliss Skains 06/05/2018, 2:02 PM

## 2018-06-05 NOTE — Patient Care Conference (Signed)
Inpatient RehabilitationTeam Conference and Plan of Care Update Date: 06/05/2018   Time: 2:20 PM    Patient Name: [name] [name]      Medical Record Number: [id number]  Date of Birth: [birth year] Sex: Female         Room/Bed: 4M03C/4M03C-01 Payor Info: Payor: MEDICAID [place] / Plan: MEDICAID [place] ACCESS / Product Type: *No Product type* /    Admitting Diagnosis: [place] CVA  Admit Date/Time:  05/27/2018  3:36 PM Admission Comments: No comment available   Primary Diagnosis:  SAH (subarachnoid hemorrhage) (HCC) Principal Problem: SAH (subarachnoid hemorrhage) ([place])  Patient Active Problem List   Diagnosis Date Noted  . 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 Leukopenia   . Acute deep vein thrombosis (DVT) of right peroneal vein   . Hypokalemia   . Nonverbal   . Essential hypertension   . Hyperglycemia   . SAH (subarachnoid hemorrhage) ([place])   . Acute cerebral infarction ([place])   . HCAP (healthcare-associated pneumonia)   . Benign essential HTN   . Acute blood loss anemia   . Right hemiplegia ([place])   . 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 Endotracheal tube present   . Brain aneurysm   . Subarachnoid hemorrhage ([place]) 05/09/2018  . Gastroesophageal reflux disease without esophagitis 10/09/2016  . BMI 31.0-31.9,adult 07/20/2016  . Obesity in pregnancy, antepartum 07/20/2016  . Tobacco abuse 06/01/2015  . Marijuana use 06/23/2014    Expected Discharge Date: Expected Discharge Date: 06/21/18  Team Members Present: Physician leading conference: Dr. 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  Assist with tolieting needs PRN.   Swallow/Nutrition/ Hydration             ADL's   Mod A squat pivot transfers, mod A UB dressing, mod A LB dressing with max A for standing balance during clothing management. Total A toileting bed level following incontinence  Min A overall  Functional transfers, sit>stand, ADL re-training, R neuro re-ed   Mobility   minA bed mobility, modA transfers, maxA gait (standing/gait limited since eval d/t RLE pain)  minA overall  R NMR, sitting/standing balance, progressing standing/gait as tolerated   Communication   marked improvements, min assist   supervision, upgraded   higher level word finding    Safety/Cognition/ Behavioral Observations  marked improvements, min assist  supervision, upgraded   problem solving, memory, attention   Pain   Patient pain under control.  Remain pain free.  Assess for pain qshift and PRN.   Skin   No new changes to skin, applying barrier cream as needed   Maintain skin integrity.  Assess skin qshift and as needed.      *See Care Plan and progress notes for long and short-term goals.     Barriers to Discharge  Current Status/Progress Possible Resolutions Date Resolved   Physician    Medical stability;Behavior;Decreased caregiver support;Lack  of/limited family support;Incontinence     See above  Therapies, follow labs, Discuss DVT with vascular, consider treatment of tone, optimize BP meds      Nursing  Incontinence               PT                    OT                  SLP                SW                Discharge Planning/Teaching Needs:  Making good progress in her therapies, plan still short term NHP due to does  not have 24 hr care at discharge      Team Discussion:  Making progress toward her goals of min assist level. Currently mod-max level of assist. Lacks inaitation for tolieting. Nursing to try timed tolieting.  Dopplers yesterday DVT and cyst behind knee causing pt pain and limiting her in therapies. MD watching labs. With speech communicating more.   Revisions to Treatment Plan:  DC 1/3    Continued Need for Acute Rehabilitation Level of Care: The patient requires daily medical management by a physician with specialized training in physical medicine and rehabilitation for the following conditions: Daily direction of a multidisciplinary physical rehabilitation program to ensure safe treatment while eliciting the highest outcome that is of practical value to the patient.: Yes Daily medical management of patient stability for increased activity during participation in an intensive rehabilitation regime.: Yes Daily analysis of laboratory values and/or radiology reports with any subsequent need for medication adjustment of medical intervention for : Neurological problems;Blood pressure problems;Mood/behavior problems;Other   I attest that I was present, lead the team conference, and concur with the assessment and plan of the team.   Elease Hashimoto 06/05/2018, 3:19 PM

## 2018-06-06 ENCOUNTER — Inpatient Hospital Stay (HOSPITAL_COMMUNITY): Payer: Medicaid Other | Admitting: Occupational Therapy

## 2018-06-06 ENCOUNTER — Inpatient Hospital Stay (HOSPITAL_COMMUNITY): Payer: Medicaid Other | Admitting: Physical Therapy

## 2018-06-06 ENCOUNTER — Inpatient Hospital Stay (HOSPITAL_COMMUNITY): Payer: Medicaid Other

## 2018-06-06 ENCOUNTER — Inpatient Hospital Stay (HOSPITAL_COMMUNITY): Payer: Medicaid Other | Admitting: Speech Pathology

## 2018-06-06 DIAGNOSIS — M25561 Pain in right knee: Secondary | ICD-10-CM

## 2018-06-06 LAB — CBC WITH DIFFERENTIAL/PLATELET
Abs Immature Granulocytes: 0.01 10*3/uL (ref 0.00–0.07)
BASOS ABS: 0 10*3/uL (ref 0.0–0.1)
Basophils Relative: 0 %
Eosinophils Absolute: 0.1 10*3/uL (ref 0.0–0.5)
Eosinophils Relative: 3 %
HCT: 35.9 % — ABNORMAL LOW (ref 36.0–46.0)
Hemoglobin: 11.4 g/dL — ABNORMAL LOW (ref 12.0–15.0)
Immature Granulocytes: 0 %
LYMPHS ABS: 1.6 10*3/uL (ref 0.7–4.0)
Lymphocytes Relative: 52 %
MCH: 31.3 pg (ref 26.0–34.0)
MCHC: 31.8 g/dL (ref 30.0–36.0)
MCV: 98.6 fL (ref 80.0–100.0)
Monocytes Absolute: 0.3 10*3/uL (ref 0.1–1.0)
Monocytes Relative: 11 %
Neutro Abs: 1.1 10*3/uL — ABNORMAL LOW (ref 1.7–7.7)
Neutrophils Relative %: 34 %
Platelets: 163 10*3/uL (ref 150–400)
RBC: 3.64 MIL/uL — ABNORMAL LOW (ref 3.87–5.11)
RDW: 12.2 % (ref 11.5–15.5)
WBC: 3.1 10*3/uL — ABNORMAL LOW (ref 4.0–10.5)
nRBC: 0 % (ref 0.0–0.2)

## 2018-06-06 MED ORDER — BACLOFEN 5 MG HALF TABLET
5.0000 mg | ORAL_TABLET | Freq: Three times a day (TID) | ORAL | Status: DC
  Administered 2018-06-06 – 2018-06-15 (×28): 5 mg via ORAL
  Filled 2018-06-06 (×28): qty 1

## 2018-06-06 NOTE — Progress Notes (Signed)
Occupational Therapy Session Note  Patient Details  Name: [name] [name] MRN: [id number] Date of Birth: 1985-10-22  Today's Date: 06/06/2018 OT Individual Time: 1100-1200 OT Individual Time Calculation (min): 60 min    Short Term Goals: Week 2:  OT Short Term Goal 1 (Week 2): Pt will complete squat pivot transfer to toilet/BSC with +1 assist OT Short Term Goal 2 (Week 2): Pt will stand to complete 1 grooming task at sink with max A standing balance in order to address functional standing balance/endurance OT Short Term Goal 3 (Week 2): Pt will initiate use of R UE during functional task with min cuing OT Short Term Goal 4 (Week 2): Pt will complete sit>stand from EOB with mod A +1 during dressing routine  Skilled Therapeutic Interventions/Progress Updates:    Pt seen for OT session focusing on ADL re-training, functional transfers, and neuro re-ed. Pt sitting up in tilt-in-space w/c upon arrival, denying pain at rest and agreeable to tx session. She dressed seated in w/c, able to recall hemi dressing techniques, however, did not implement though with increased time and modified methods pt able to don shirt without physical help. With total A to place R LE into figure four position, pt able to thread on pants. She stood at sink, min A to come into standing, requiring total A for blocking of knee and mod A standing balance while +2 assist pulled pants up.  Completed squat pivot transfer to drop arm BSC. Mod A transferring to L onto BSC, max +2 to safely return to w/c when transferring to R. She was able to come into partial stand in order for pants to be pulled down with +2 assist. She stood from [facility] into [facility] for new brief to be donned and pants pulled up. Pt taken to therapy day room total A in w/c for time management. Completed x2 sit>stand at high/low table to facilitate WBing through R LE and R UE. Pt tolerated ~2 minutes in standing first trial with blocking of knee and alternating min-mod  A for standing balance. On second trial, pt unable to tolerate standing 2/2 R knee pain.  Pt returned to w/c at end of session, all needs in reach and chair belt alarm donned.   Therapy Documentation Precautions:  Precautions Precautions: Fall Precaution Comments: right hemiplegia, pushes right Restrictions Weight Bearing Restrictions: No  Therapy/Group: Individual Therapy  Nicklas Mcsweeney L 06/06/2018, 7:05 AM

## 2018-06-06 NOTE — Progress Notes (Signed)
    Subjective  -   No acute events   Physical Exam:  Right leg still edematous and painful   MRI VASCULAR  Aorta, iliac arteries, femoral arteries, and popliteal arteries are widely patent. There is no signal at the origin of the left anterior tibial artery which may simply represent artifact. There is otherwise wide patency of the tibial arteries. There is no evidence of popliteal artery aneurysm.  NON-VASCULAR  This study was not optimized for delineation of anatomy. No obvious mass associated with the right knee is visualized. If there is concern for evaluation of an underlying mass, a dedicated MRI of the knees is recommended.    Assessment/Plan:    MRI did not show popliteal mass.  Additional study recommended, which I will order.  [name] [name] 06/06/2018 9:59 AM --  Vitals:   06/05/18 2000 06/06/18 0453  BP: 121/86 108/80  Pulse: 68 64  Resp:  16  Temp: 98.6 F (37 C) 98.6 F (37 C)  SpO2: 99% 100%    Intake/Output Summary (Last 24 hours) at 06/06/2018 0959 Last data filed at 06/06/2018 0800 Gross per 24 hour  Intake 720 ml  Output -  Net 720 ml     Laboratory CBC    Component Value Date/Time   WBC 3.1 (L) 06/06/2018 0537   HGB 11.4 (L) 06/06/2018 0537   HGB 11.0 (L) 09/25/2016 0902   HCT 35.9 (L) 06/06/2018 0537   HCT 33.9 (L) 09/25/2016 0902   PLT 163 06/06/2018 0537   PLT 198 09/25/2016 0902    BMET    Component Value Date/Time   NA 143 06/03/2018 0518   NA 142 08/01/2017 0934   K 4.0 06/03/2018 0518   CL 106 06/03/2018 0518   CO2 27 06/03/2018 0518   GLUCOSE 89 06/03/2018 0518   GLUCOSE 70 08/25/2014 1214   BUN 8 06/03/2018 0518   BUN 6 08/01/2017 0934   CREATININE 0.63 06/03/2018 0518   CREATININE 0.40 (L) 06/29/2016 1504   CALCIUM 9.6 06/03/2018 0518   GFRNONAA >60 06/03/2018 0518   GFRNONAA >89 07/02/2015 0945   GFRAA >60 06/03/2018 0518   GFRAA >89 07/02/2015 0945    COAG Lab Results  Component Value  Date   INR 1.00 05/09/2018   No results found for: PTT  Antibiotics Anti-infectives (From admission, onward)   None       V. Leia Alf, M.D. [facility] Office: [phone] Pager:  [phone]

## 2018-06-06 NOTE — Progress Notes (Signed)
Physical Therapy Session Note  Patient Details  Name: [name] [name] MRN: [id number] Date of Birth: Apr 15, 1986  Today's Date: 06/06/2018 PT Individual Time: 4888-9169 and 1500-1530 PT Individual Time Calculation (min): 55 min and 30 min (total 85 min)   Short Term Goals: Week 2:  PT Short Term Goal 1 (Week 2): Pt will perform gait x25' maxA +1 PT Short Term Goal 2 (Week 2): Pt will perform w/c propulsion x150' with min guard PT Short Term Goal 3 (Week 2): Pt will demo dynamic standing balance x3 min with modA  Skilled Therapeutic Interventions/Progress Updates: Tx 1: Pt received seated in w/c, denies pain and agreeable to treatment. Transported to/from gym [place]. Squat pivot transfer to L side with minA. Sit <>stand from mat table min guard with stedy. Performed card reaching/matching task with RUE while in high perch position on stedy seat for WB through RLE without provocation of knee pain. Sit <>stand and dynamic reaching to R/L sides for focus on weight shifting, midline orientation. Performed e-stim to R quads; observed contraction however pt unable to AAROM long arc quad. Returned to w/c modA squat pivot to R side. Remained semi-reclined in w/c at end of session, lap belt alarm intact and all needs in reach.   Tx 2: Pt received seated in w/c, denies pain and agreeable to treatment. Sit <>stand x2 trials at L hall rail; max encouragement to attempt taking L step, able to take one step and then declines further attempts d/t pain in R post knee. Squat pivot w/c <>nustep minA. Utilized R hand splint and R leg attachment to maintain neutral alignment. Returned to room totalA; squat pivot to return to bed modA to R side. MinA sit >supine for RLE management. Remained supine in bed, alarm intact, four bedrails per pt request, all needs in reach.      Therapy Documentation Precautions:  Precautions Precautions: Fall Precaution Comments: right hemiplegia, pushes right Restrictions Weight  Bearing Restrictions: No    Therapy/Group: Individual Therapy  Corliss Skains 06/06/2018, 1:54 PM

## 2018-06-06 NOTE — Progress Notes (Signed)
[facility] PHYSICAL MEDICINE & REHABILITATION PROGRESS NOTE  Subjective/Complaints: Patient seen laying in bed this morning.  She states she slept well after falling asleep due to her late MRI yesterday.  ROS: Denies CP, shortness of breath, nausea, vomiting, diarrhea.  Objective: Vital Signs: Blood pressure 108/80, pulse 64, temperature 98.6 F (37 C), resp. rate 16, height 5\' 10"  (1.778 m), weight 87 kg, SpO2 100 %, currently breastfeeding. Mr Jodene Nam Lower Extremity Right Wo Contrast  Result Date: 06/06/2018 CLINICAL DATA:  Mass adjacent to knee EXAM: MR ANGIOGRAPHY BILATERAL LOWER EXTREMITIES TECHNIQUE: Multiplanar multisequence MR imaging of the lower extremities was performed without contrast. Multiplanar MR image reconstructions including MIPs were obtained to evaluate the vascular anatomy. CONTRAST:  None COMPARISON:  None. FINDINGS: The distal aorta is patent. Bilateral common, internal, and external iliac arteries are patent and nonaneurysmal. Bilateral common femoral, superficial femoral, and profunda femoral arteries are patent and nonaneurysmal. Popliteal arteries are nonaneurysmal and patent. The origin of the left anterior tibial artery does not demonstrate signal. This may be artifactual or a true area of narrowing. The remainder of the left anterior tibial artery is widely patent. Otherwise, there is wide patency of the tibial vessels bilaterally for 3 vessel runoff on the right and patency of the left posterior tibial and peroneal arteries on the left. There is no evidence of popliteal artery aneurysm. This study was a time-of-flight noncontrast MRA. Delineation of anatomy is extremely limited. A mass associated with the right knee is not clearly defined. IMPRESSION: VASCULAR Aorta, iliac arteries, femoral arteries, and popliteal arteries are widely patent. There is no signal at the origin of the left anterior tibial artery which may simply represent artifact. There is otherwise wide  patency of the tibial arteries. There is no evidence of popliteal artery aneurysm. NON-VASCULAR This study was not optimized for delineation of anatomy. No obvious mass associated with the right knee is visualized. If there is concern for evaluation of an underlying mass, a dedicated MRI of the knees is recommended. Electronically Signed   By: Marybelle Killings M.D.   On: 06/06/2018 07:46   Vas [name] Lower Extremity Venous (dvt)  Result Date: 06/04/2018  Lower Venous Study Indications: Follow up DVT 05/28/18.  Performing Technologist: Landry Mellow RDMS, RVT  Examination Guidelines: A complete evaluation includes B-mode imaging, spectral Doppler, color Doppler, and power Doppler as needed of all accessible portions of each vessel. Bilateral testing is considered an integral part of a complete examination. Limited examinations for reoccurring indications may be performed as noted.  Right Venous Findings: +---------+---------------+---------+-----------+----------+-----------------+          CompressibilityPhasicitySpontaneityPropertiesSummary           +---------+---------------+---------+-----------+----------+-----------------+ CFV      Full           Yes      Yes                                    +---------+---------------+---------+-----------+----------+-----------------+ SFJ      Full                                                           +---------+---------------+---------+-----------+----------+-----------------+ FV Prox  Full                                                           +---------+---------------+---------+-----------+----------+-----------------+  FV Mid   Full                                                           +---------+---------------+---------+-----------+----------+-----------------+ FV DistalFull                                                           +---------+---------------+---------+-----------+----------+-----------------+ PFV       Full                                                           +---------+---------------+---------+-----------+----------+-----------------+ POP      Full           Yes      Yes                                    +---------+---------------+---------+-----------+----------+-----------------+ PTV      Partial                                      Age Indeterminate +---------+---------------+---------+-----------+----------+-----------------+ PERO     None                                         Age Indeterminate +---------+---------------+---------+-----------+----------+-----------------+  Left Venous Findings: +---+---------------+---------+-----------+----------+-------+    CompressibilityPhasicitySpontaneityPropertiesSummary +---+---------------+---------+-----------+----------+-------+ CFVFull           Yes      Yes                          +---+---------------+---------+-----------+----------+-------+    Summary: Right: Findings consistent with age indeterminate deep vein thrombosis involving the right posterior tibial vein, and right peroneal vein. A cystic structure is found in the popliteal fossa. No propagation of thrombus since last exam. Left: No evidence of common femoral vein obstruction.  *See table(s) above for measurements and observations. Electronically signed by Deitra Mayo MD on 06/04/2018 at 4:35:01 PM.    Final    Recent Labs    06/06/18 0537  WBC 3.1*  HGB 11.4*  HCT 35.9*  PLT 163   No results for input(s): NA, K, CL, CO2, GLUCOSE, BUN, CREATININE, CALCIUM in the last 72 hours.  Physical Exam: BP 108/80 (BP Location: Left Arm)   Pulse 64   Temp 98.6 F (37 C)   Resp 16   Ht 5\' 10"  (1.778 m)   Wt 87 kg   SpO2 100%   BMI 27.52 kg/m  Constitutional: No distress . Vital signs reviewed.  Obese. HENT: Normocephalic.  Atraumatic. Eyes: EOMI. No discharge. Cardiovascular: RRR.  No JVD. Respiratory: CTA bilaterally.  Normal  effort. GI: BS +. Non-distended. Musc: No edema or tenderness in extremities. Neurological:  She is alert Delayed processing, improving.  Right upper extremity/right lower extremity: 0/5 proximal to distal, unchanged. Emerging tone on right side Left upper extremity: 4/5 proximal distal Left lower extremity: 4-/5 proximal distal  Skin: She is not diaphoretic. Crani incision c/d/i  Psychiatric: Improving affect    Assessment/Plan: 1. Functional deficits secondary to left ACA with SAH which require 3+ hours per day of interdisciplinary therapy in a comprehensive inpatient rehab setting.  Physiatrist is providing close team supervision and 24 hour management of active medical problems listed below.  Physiatrist and rehab team continue to assess barriers to discharge/monitor patient progress toward functional and medical goals  Care Tool:  Bathing    Body parts bathed by patient: Chest, Abdomen, Right arm, Right upper leg, Left upper leg, Face, Front perineal area, Right lower leg, Left lower leg   Body parts bathed by helper: Left arm, Buttocks     Bathing assist Assist Level: Moderate Assistance - Patient 50 - 74%     Upper Body Dressing/Undressing Upper body dressing   What is the patient wearing?: Pull over shirt    Upper body assist Assist Level: Moderate Assistance - Patient 50 - 74%    Lower Body Dressing/Undressing Lower body dressing      What is the patient wearing?: Incontinence brief, Pants     Lower body assist Assist for lower body dressing: Maximal Assistance - Patient 25 - 49%     Toileting Toileting Toileting Activity did not occur (Clothing management and hygiene only): Safety/medical concerns  Toileting assist Assist for toileting: Total Assistance - Patient < 25%     Transfers Chair/bed transfer  Transfers assist  Chair/bed transfer activity did not occur: N/A  Chair/bed transfer assist level: Minimal Assistance - Patient > 75%      Locomotion Ambulation   Ambulation assist      Assist level: 2 helpers Assistive device: Other (comment)(hall rail) Max distance: 25   Walk 10 feet activity   Assist     Assist level: 2 helpers     Walk 50 feet activity   Assist Walk 50 feet with 2 turns activity did not occur: Safety/medical concerns         Walk 150 feet activity   Assist Walk 150 feet activity did not occur: Safety/medical concerns         Walk 10 feet on uneven surface  activity   Assist Walk 10 feet on uneven surfaces activity did not occur: Safety/medical concerns         Wheelchair     Assist Will patient use wheelchair at discharge?: (TBD)   Wheelchair activity did not occur: Safety/medical concerns  Wheelchair assist level: Minimal Assistance - Patient > 75% Max wheelchair distance: 150    Wheelchair 50 feet with 2 turns activity    Assist    Wheelchair 50 feet with 2 turns activity did not occur: Safety/medical concerns   Assist Level: Minimal Assistance - Patient > 75%   Wheelchair 150 feet activity     Assist Wheelchair 150 feet activity did not occur: Safety/medical concerns   Assist Level: Minimal Assistance - Patient > 75%      Medical Problem List and Plan: 1.  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   -Continue CIR  2.  DVT Prophylaxis/Anticoagulation: Mechanical: Sequential compression devices, below knee Bilateral lower extremities Right peroneal and posterior tibial vein acute DVT-repeat Dopplers suggesting persistent DVT without propagation, will plan  for repeat on 12/25 3. Pain Management: Tylenol prn.   Right knee pain-likely related to spasticity, MRI/MRA reviewed no significant stroke identified.  Appreciate vascular Recs, await further recommendations 4. Mood: Team to provide ego support.  LCSW to follow for evaluation and support.  5. Neuropsych: This  patient is not fully capable of making decisions on her own behalf. 6. Skin/Wound Care: Monitor incision for healing.  7. Fluids/Electrolytes/Nutrition: Monitor I/Os. Offer supplements if intake poor.   BMP normal 12/16 8. HTN: Monitor BP bid  Amlodipine decreased to 7.5 on 12/18    Vitals:   06/05/18 2000 06/06/18 0453  BP: 121/86 108/80  Pulse: 68 64  Resp:  16  Temp: 98.6 F (37 C) 98.6 F (37 C)  SpO2: 99% 100%   Controlled on 12/19 9. [place] HCAP: Has completed antibiotic regimen on 12/4 10. ABLA: Continue to monitor H/H serially.   Hemoglobin 11.4 on 12/19  Continue to monitor 11.  Impaired fasting glucose: Likely stress related.   HbA1c 4.3 on 12/10- normal 12.  HypoK - supplement KCL  Potassium 4.0 on 12/16  Labs ordered for tomorrow  Continue to monitor 13.  Spastic hemiplegia  Right lower extremity with increased hamstring tone  tizanidine qhs DC'd on 12/19  Baclofen 5 3 times daily started on 12/19  Cont ROM   Educated regarding appropriate positioning 14.  Leukopenia  WBCs 3.1 on 12/19  Continue to monitor   LOS: 10 days A FACE TO FACE EVALUATION WAS PERFORMED  Jerry Clyne Lorie Phenix 06/06/2018, 8:07 AM

## 2018-06-06 NOTE — Progress Notes (Signed)
Speech Language Pathology Daily Session Note  Patient Details  Name: Charlise Giovanetti MRN: [id number] Date of Birth: [birth year]  Today's Date: 06/06/2018 SLP Individual Time: 6629-4765 SLP Individual Time Calculation (min): 40 min  Short Term Goals: Week 2: SLP Short Term Goal 1 (Week 2): Pt will use compensatory strategies for mildly complex word finding during functional tasks with min assist multimodal cues.   SLP Short Term Goal 2 (Week 2): Pt will complete basic, familiar tasks with min assist multimodal cues for functional problem solving.   SLP Short Term Goal 3 (Week 2): Pt will sustain her attention to basic, familiar tasks for 15 minutes with min assist multimodal cues for redirection.    Skilled Therapeutic Interventions:  Pt was seen for skilled ST targeting cognitive-linguistic goals.  Pt was in bed upon arrival, awake, alert, and agreeable to getting out of bed for therapy.  Pt washed her face and brushed her teeth with intermittent set up assist but overall mod I.  SLP facilitated the session with a novel generative naming task targeting word finding goals.  Pt was able to name at least 5 members from targeted categories of increasing complexity with min assist verbal cues.  Pt feels her language is improving but is not yet back to baseline.  Pt was left in wheelchair with chair alarm set and call bell within reach.  Continue per current plan of care.    Pain Pain Assessment Pain Scale: 0-10 Pain Score: 0-No pain  Therapy/Group: Individual Therapy  Shewanda Sharpe, Selinda Orion 06/06/2018, 8:49 AM

## 2018-06-07 ENCOUNTER — Inpatient Hospital Stay (HOSPITAL_COMMUNITY): Payer: Medicaid Other | Admitting: Occupational Therapy

## 2018-06-07 ENCOUNTER — Inpatient Hospital Stay (HOSPITAL_COMMUNITY): Payer: Medicaid Other

## 2018-06-07 ENCOUNTER — Inpatient Hospital Stay (HOSPITAL_COMMUNITY): Payer: Medicaid Other | Admitting: Physical Therapy

## 2018-06-07 ENCOUNTER — Inpatient Hospital Stay (HOSPITAL_COMMUNITY): Payer: Medicaid Other | Admitting: Speech Pathology

## 2018-06-07 DIAGNOSIS — M1711 Unilateral primary osteoarthritis, right knee: Secondary | ICD-10-CM

## 2018-06-07 DIAGNOSIS — S83271A Complex tear of lateral meniscus, current injury, right knee, initial encounter: Secondary | ICD-10-CM

## 2018-06-07 MED ORDER — AMLODIPINE BESYLATE 5 MG PO TABS
5.0000 mg | ORAL_TABLET | Freq: Every day | ORAL | Status: DC
  Administered 2018-06-07 – 2018-06-10 (×4): 5 mg via ORAL
  Filled 2018-06-07 (×4): qty 1

## 2018-06-07 MED ORDER — DICLOFENAC SODIUM 1 % TD GEL
2.0000 g | Freq: Four times a day (QID) | TRANSDERMAL | Status: DC
  Administered 2018-06-07 – 2018-06-23 (×43): 2 g via TOPICAL
  Filled 2018-06-07: qty 100

## 2018-06-07 NOTE — Progress Notes (Signed)
Occupational Therapy Session Note  Patient Details  Name: [name] [name] MRN: [id number] Date of Birth: 26-Jul-1985  Today's Date: 06/07/2018 OT Individual Time: 8251-8984 OT Individual Time Calculation (min): 40 min    Short Term Goals: Week 1:  OT Short Term Goal 1 (Week 1): Pt will consistently complete basic transfers with +1 assist in order to reduce caregiver burden OT Short Term Goal 1 - Progress (Week 1): Met OT Short Term Goal 2 (Week 1): Pt will recall hemi-dressing technique during UB dressing with min cuing OT Short Term Goal 2 - Progress (Week 1): Met OT Short Term Goal 3 (Week 1): Pt will complete sit>stand at sink with +1 assist during LB dressing task in order to reduce caregiver burden OT Short Term Goal 3 - Progress (Week 1): Met  Skilled Therapeutic Interventions/Progress Updates:    1;1. Pt received in bed in supported circle sitting. Pt selects clothing from bag with HOH A to use RUE as gross assist for zipper. Pt selects clothing and dons pants with VC for hemi techqniues in circle sitting and A to advance pants R hip sit>stand in stedy. Pt dons socks and shoes (A to fasten B shoes and don R shoe) in circle sitting managing LE wihtmin VC. Pt dons shirt with min A for threading RUE seated on steady flaps. Pt brushes teeth seated on stedy flaps with S. Pt requires increased time to problem solve reapplying toothpaste cap to toothbrush. PtWB RUE onto bar of stedy during tasks. Exited session with pt seated in TIS, tilted back, safety belt on and call light in reach  Therapy Documentation Precautions:  Precautions Precautions: Fall Precaution Comments: right hemiplegia, pushes right Restrictions Weight Bearing Restrictions: No General:   Vital Signs:   Pain: Pain Assessment Pain Scale: 0-10 Pain Score: 0-No pain ADL:   Vision   Perception    Praxis   Exercises:   Other Treatments:     Therapy/Group: Individual Therapy  Tonny Branch 06/07/2018, 9:31 AM

## 2018-06-07 NOTE — Progress Notes (Signed)
Occupational Therapy Session Note  Patient Details  Name: [name] [name] MRN: [id number] Date of Birth: May 20, 1986  Today's Date: 06/07/2018 OT Individual Time: 1300-1400 OT Individual Time Calculation (min): 60 min    Short Term Goals: Week 2:  OT Short Term Goal 1 (Week 2): Pt will complete squat pivot transfer to toilet/BSC with +1 assist OT Short Term Goal 2 (Week 2): Pt will stand to complete 1 grooming task at sink with max A standing balance in order to address functional standing balance/endurance OT Short Term Goal 3 (Week 2): Pt will initiate use of R UE during functional task with min cuing OT Short Term Goal 4 (Week 2): Pt will complete sit>stand from EOB with mod A +1 during dressing routine  Skilled Therapeutic Interventions/Progress Updates:    Pt seen for OT session focusing on sit>stand and w/c management. Pt sitting up in w/c upon arrival with aunt present. Pt denying pain and agreeable to tx session. Pt now with R knee immobilizer, WBAT. Noted pt's pants to be saturated with urine, pt agreeable to changing. She stood at sink with min A to power into standing, mod-max standing balance while total A provided with +2 assist to change brief and complete hygiene. With min A and pt using L UE, pt able to cross R LE into figure four position in order to thread on pants. She stood in same manner as described above to have pants pulled up.  Retrieved standard w/c for pt as pt now appropriate for standard upright w/c due to improvements in trunk control and safety awareness. Elevating leg rest for R LE. Pt able to stand at sink ~1 minute for w/c to be changed out, assist for standing as noted above. Pt practiced self propelling w/c throughout unit using hemi technique with supervision and supervision level cuing for environmetnal obstacles on L. Pt returned to room at end of session, left seated in w/c with chair belt alarm donned and all needs in reach, aunt present.   Therapy  Documentation Precautions:  Precautions Precautions: Fall Precaution Comments: right hemiplegia, pushes right Restrictions Weight Bearing Restrictions: No   Therapy/Group: Individual Therapy  Gustabo Gordillo L 06/07/2018, 6:51 AM

## 2018-06-07 NOTE — Progress Notes (Addendum)
[facility]  Subjective  - No new complaints.   Objective 96/66 75 98.6 F (37 C) (Oral) 14 100%  Intake/Output Summary (Last 24 hours) at 06/07/2018 8003 Last data filed at 06/06/2018 1730 Gross per 24 hour  Intake 480 ml  Output -  Net 480 ml    Right knee edema with tenderness to palpation, calf soft  Bounding B pedal pulses   MRI 06/06/2018: IMPRESSION: 1. Tiny Baker cyst, likely corresponding to the cystic structure seen in the popliteal fossa on recent right lower extremity ultrasound. 2. Complex tear of the lateral meniscus anterior root and horn extending to the body junction with borderline extrusion of the body. 3. Mild edema tracks adjacent to the MCL. This can be incidental but in the appropriate clinical circumstance could represent grade 1 sprain. 4. Mild medial and lateral compartment osteoarthritis.  Venous duplex 06/04/2018 compared with DVT study 05/28/2018. Summary: Right: Findings consistent with age indeterminate deep vein thrombosis involving the right posterior tibial vein, and right peroneal vein. A cystic structure is found in the popliteal fossa. No propagation of thrombus since last exam. Left: No evidence of common femoral vein obstruction.     Assessment/Planning: DVT   in the right posterior tibial and peroneal vein.   Recommend repeat duplex in a couple of weeks.  If she does have propagation of thrombus, she may need an IVC filter since she is not a candidate for anticoagulation.  Repeat MRI reveals  Complex tear of the lateral meniscus anterior root and horn with tiny baker cyst and osteoarthritis.  No vascular intervention needed at this time.  F/U venous duplex in 2-3 weeks.  F/U with Dr. Trula Slade.  She may benefit from an Orthopedic consult and right supportive knee brace for tranfers.  Roxy Horseman 06/07/2018 8:12 AM --  MRI findings as above Currently no indications for IVC  filter Repeat duplex as suggested above Follow up [place] Will not follow closely  Please call us if clinical condition changes  Ruta Hinds, MD [facility]: [phone] Pager: [phone]  Laboratory Lab Results: Recent Labs    06/06/18 0537  WBC 3.1*  HGB 11.4*  HCT 35.9*  PLT 163   BMET No results for input(s): NA, K, CL, CO2, GLUCOSE, BUN, CREATININE, CALCIUM in the last 72 hours.  COAG Lab Results  Component Value Date   INR 1.00 05/09/2018   No results found for: PTT

## 2018-06-07 NOTE — Progress Notes (Signed)
Physical Therapy Session Note  Patient Details  Name: [name] [name] MRN: [id number] Date of Birth: 07-Jul-1985  Today's Date: 06/07/2018 PT Individual Time: 1415-1530 PT Individual Time Calculation (min): 75 min   Short Term Goals: Week 2:  PT Short Term Goal 1 (Week 2): Pt will perform gait x25' maxA +1 PT Short Term Goal 2 (Week 2): Pt will perform w/c propulsion x150' with min guard PT Short Term Goal 3 (Week 2): Pt will demo dynamic standing balance x3 min with modA  Skilled Therapeutic Interventions/Progress Updates: Pt received in w/c, denies pain and agreeable to treatment. Knee immobilizer on RLE throughout session. Performed slideboard transfer R/L w/c <>bed with min guard to L, minA to R to initiate first scoot, then min guard. W/c propulsion to gym L hemi technique with S. Adjusted elevating leg rest for comfort and positioning. Slideboard transfer to L onto mat table minA. Sit <>stand with RW, R hand on RW with dycem on hand rest, min/modA overall d/t occasional L lean, able to transition hand from mat>RW once standing. Pre-gait forward/backward steps with min/modA; unable to progress RLE but appears to have increased confidence WB through RLE with KI on and BUE on RW. Seated RUE reaching/grasping task with AAROM shoulder flexion and cues to reduce LUE assist to complete task. Stand pivot minA +2 with RW to w/c. Gait x2 trials at 64' and 11' with min/modA +2 for safety; requires totalA for RLE placement and minA for stance control. Therapist able to palpate trace quad activation in stance; cues for upright posture. W/c propulsion to return to room with S. Slideboard transfer to bed towards R side minA; minA sit>supine for RLE management. Remained in bed, alarm intact, four bedrails intact per pt request, all needs in reach.      Therapy Documentation Precautions:  Precautions Precautions: Fall Precaution Comments: right hemiplegia, pushes right Restrictions Weight Bearing  Restrictions: No Pain: Pain Assessment Pain Scale: 0-10 Pain Score: 0-No pain   Therapy/Group: Individual Therapy  Corliss Skains 06/07/2018, 3:59 PM

## 2018-06-07 NOTE — Progress Notes (Signed)
[facility] PHYSICAL MEDICINE & REHABILITATION PROGRESS NOTE  Subjective/Complaints: Patient seen lying in bed this morning.  She states she slept well overnight.  She is still sleepy this morning.  Vascular saw patient yesterday, notes reviewed.  ROS: Denies CP, shortness of breath, nausea, vomiting, diarrhea.  Objective: Vital Signs: Blood pressure 96/66, pulse 75, temperature 98.6 F (37 C), temperature source Oral, resp. rate 14, height 5\' 10"  (1.778 m), weight 88.2 kg, SpO2 100 %, currently breastfeeding. Mr Knee Right Wo Contrast  Result Date: 06/07/2018 CLINICAL DATA:  Right calf DVT with cystic structure in the popliteal fossa. EXAM: MRI OF THE RIGHT KNEE WITHOUT CONTRAST TECHNIQUE: Multiplanar, multisequence MR imaging of the knee was performed. No intravenous contrast was administered. COMPARISON:  None. FINDINGS: MENISCI Medial meniscus:  Intact. Lateral meniscus: Complex tear of the anterior root and horn extending to body junction with borderline extrusion of the body. LIGAMENTS Cruciates:  Intact ACL and PCL.  Mucoid degeneration of the ACL. Collaterals: Medial collateral ligament is intact. Prominent edema adjacent to the proximal MCL. Lateral collateral ligament complex is intact. CARTILAGE Patellofemoral:  No chondral defect. Medial:  Mild partial-thickness cartilage loss. Lateral:  Mild partial-thickness cartilage loss. Joint: Small to moderate joint effusion. Mild edema within Hoffa's fat. No plical thickening. Popliteal Fossa:  Tiny Baker cyst.  Intact popliteus tendon. Extensor Mechanism: Intact quadriceps tendon and patellar tendon. Intact medial and lateral patellar retinaculum. Intact MPFL. Bones: No acute fracture or dislocation. Small focus of marrow edema in the posterior lateral tibial plateau. No focal bone lesion. Other: None. IMPRESSION: 1. Tiny Baker cyst, likely corresponding to the cystic structure seen in the popliteal fossa on recent right lower extremity ultrasound.  2. Complex tear of the lateral meniscus anterior root and horn extending to the body junction with borderline extrusion of the body. 3. Mild edema tracks adjacent to the MCL. This can be incidental but in the appropriate clinical circumstance could represent grade 1 sprain. 4. Mild medial and lateral compartment osteoarthritis. Electronically Signed   By: Titus Dubin M.D.   On: 06/07/2018 02:12   Mr Jodene Nam Lower Extremity Right Wo Contrast  Result Date: 06/06/2018 CLINICAL DATA:  Mass adjacent to knee EXAM: MR ANGIOGRAPHY BILATERAL LOWER EXTREMITIES TECHNIQUE: Multiplanar multisequence MR imaging of the lower extremities was performed without contrast. Multiplanar MR image reconstructions including MIPs were obtained to evaluate the vascular anatomy. CONTRAST:  None COMPARISON:  None. FINDINGS: The distal aorta is patent. Bilateral common, internal, and external iliac arteries are patent and nonaneurysmal. Bilateral common femoral, superficial femoral, and profunda femoral arteries are patent and nonaneurysmal. Popliteal arteries are nonaneurysmal and patent. The origin of the left anterior tibial artery does not demonstrate signal. This may be artifactual or a true area of narrowing. The remainder of the left anterior tibial artery is widely patent. Otherwise, there is wide patency of the tibial vessels bilaterally for 3 vessel runoff on the right and patency of the left posterior tibial and peroneal arteries on the left. There is no evidence of popliteal artery aneurysm. This study was a time-of-flight noncontrast MRA. Delineation of anatomy is extremely limited. A mass associated with the right knee is not clearly defined. IMPRESSION: VASCULAR Aorta, iliac arteries, femoral arteries, and popliteal arteries are widely patent. There is no signal at the origin of the left anterior tibial artery which may simply represent artifact. There is otherwise wide patency of the tibial arteries. There is no evidence of  popliteal artery aneurysm. NON-VASCULAR This study was not  optimized for delineation of anatomy. No obvious mass associated with the right knee is visualized. If there is concern for evaluation of an underlying mass, a dedicated MRI of the knees is recommended. Electronically Signed   By: Marybelle Killings M.D.   On: 06/06/2018 07:46   Recent Labs    06/06/18 0537  WBC 3.1*  HGB 11.4*  HCT 35.9*  PLT 163   No results for input(s): NA, K, CL, CO2, GLUCOSE, BUN, CREATININE, CALCIUM in the last 72 hours.  Physical Exam: BP 96/66 (BP Location: Left Arm)   Pulse 75   Temp 98.6 F (37 C) (Oral)   Resp 14   Ht 5\' 10"  (1.778 m)   Wt 88.2 kg   SpO2 100%   BMI 27.90 kg/m  Constitutional: No distress . Vital signs reviewed.  Obese. HENT: Normocephalic.  Atraumatic. Eyes: EOMI. No discharge. Cardiovascular: RRR.  No JVD. Respiratory: CTA bilaterally.  Normal effort. GI: BS +. Non-distended. Musc: No edema or tenderness in extremities. Neurological: She is alert Delayed processing, improving.  Right upper extremity/right lower extremity: 0/5 proximal to distal, unchanged. Emerging tone on right side Left upper extremity: 4/5 proximal distal, stable Left lower extremity: 4-/5 proximal distal, stable Skin: She is not diaphoretic. Crani incision c/d/i  Psychiatric: Improving affect    Assessment/Plan: 1. Functional deficits secondary to left ACA with SAH which require 3+ hours per day of interdisciplinary therapy in a comprehensive inpatient rehab setting.  Physiatrist is providing close team supervision and 24 hour management of active medical problems listed below.  Physiatrist and rehab team continue to assess barriers to discharge/monitor patient progress toward functional and medical goals  Care Tool:  Bathing    Body parts bathed by patient: Chest, Abdomen, Right arm, Right upper leg, Left upper leg, Face, Front perineal area, Right lower leg, Left lower leg   Body parts bathed by  helper: Left arm, Buttocks     Bathing assist Assist Level: Moderate Assistance - Patient 50 - 74%     Upper Body Dressing/Undressing Upper body dressing   What is the patient wearing?: Pull over shirt    Upper body assist Assist Level: Supervision/Verbal cueing    Lower Body Dressing/Undressing Lower body dressing      What is the patient wearing?: Incontinence brief, Pants     Lower body assist Assist for lower body dressing: 2 Helpers     Toileting Toileting Toileting Activity did not occur (Clothing management and hygiene only): Safety/medical concerns  Toileting assist Assist for toileting: Total Assistance - Patient < 25%     Transfers Chair/bed transfer  Transfers assist  Chair/bed transfer activity did not occur: N/A  Chair/bed transfer assist level: Minimal Assistance - Patient > 75%     Locomotion Ambulation   Ambulation assist      Assist level: 2 helpers Assistive device: Other (comment)(hall rail) Max distance: 25   Walk 10 feet activity   Assist     Assist level: 2 helpers     Walk 50 feet activity   Assist Walk 50 feet with 2 turns activity did not occur: Safety/medical concerns         Walk 150 feet activity   Assist Walk 150 feet activity did not occur: Safety/medical concerns         Walk 10 feet on uneven surface  activity   Assist Walk 10 feet on uneven surfaces activity did not occur: Safety/medical concerns         Wheelchair  Assist Will patient use wheelchair at discharge?: (TBD)   Wheelchair activity did not occur: Safety/medical concerns  Wheelchair assist level: Minimal Assistance - Patient > 75% Max wheelchair distance: 150    Wheelchair 50 feet with 2 turns activity    Assist    Wheelchair 50 feet with 2 turns activity did not occur: Safety/medical concerns   Assist Level: Minimal Assistance - Patient > 75%   Wheelchair 150 feet activity     Assist Wheelchair 150 feet  activity did not occur: Safety/medical concerns   Assist Level: Minimal Assistance - Patient > 75%      Medical Problem List and Plan: 1.  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   -Continue CIR  2.  DVT Prophylaxis/Anticoagulation: Mechanical: Sequential compression devices, below knee Bilateral lower extremities Right peroneal and posterior tibial vein acute DVT-repeat Dopplers suggesting persistent DVT without propagation, will plan for repeat on 12/25 3. Pain Management: Tylenol prn.   Right knee pain-, MRA reviewed no significant source identified.  Appreciate vascular Recs, MRI reviewed showing Baker's cyst, lateral meniscal tear, and OA.     Voltaren gel ordered   Knee immobilizer ordered 4. Mood: Team to provide ego support.  LCSW to follow for evaluation and support.  5. Neuropsych: This patient is not fully capable of making decisions on her own behalf. 6. Skin/Wound Care: Monitor incision for healing.  7. Fluids/Electrolytes/Nutrition: Monitor I/Os. Offer supplements if intake poor.   BMP normal 12/16 8. HTN: Monitor BP bid  Amlodipine decreased to 7.5 on 12/18, decreased to 5 on 12/20    Vitals:   06/06/18 2040 06/07/18 0503  BP: 94/65 96/66  Pulse: 72 75  Resp: 18 14  Temp: 97.7 F (36.5 C) 98.6 F (37 C)  SpO2: 100% 100%  9. [place] HCAP: Has completed antibiotic regimen on 12/4 10. ABLA: Continue to monitor H/H serially.   Hemoglobin 11.4 on 12/19  Continue to monitor 11.  Impaired fasting glucose: Likely stress related.   HbA1c 4.3 on 12/10- normal 12.  HypoK - supplement KCL  Potassium 4.0 on 12/16  Labs ordered for tomorrow  Continue to monitor 13.  Spastic hemiplegia  Right lower extremity with increased hamstring tone  Tizanidine qhs DC'd on 12/19  Baclofen 5 3 times daily started on 12/19  Cont ROM   Educated regarding appropriate positioning 14.  Leukopenia  WBCs 3.1 on  12/19  Labs ordered for Monday  Continue to monitor   LOS: 11 days A FACE TO FACE EVALUATION WAS PERFORMED  Ankit Lorie Phenix 06/07/2018, 8:14 AM

## 2018-06-07 NOTE — Progress Notes (Signed)
Orthopedic Tech Progress Note Patient Details:  [name] [name] 03/06/1986 [id number]  Ortho Devices Type of Ortho Device: Knee Immobilizer Ortho Device/Splint Location: right Ortho Device/Splint Interventions: Application   Post Interventions Patient Tolerated: Well Instructions Provided: Care of device   [name] 06/07/2018, 12:52 PM

## 2018-06-07 NOTE — Progress Notes (Signed)
Speech Language Pathology Daily Session Note  Patient Details  Name: [name] [name] MRN: [id number] Date of Birth: 07/09/1985  Today's Date: 06/07/2018 SLP Individual Time: 1135-1200 SLP Individual Time Calculation (min): 25 min  Short Term Goals: Week 2: SLP Short Term Goal 1 (Week 2): Pt will use compensatory strategies for mildly complex word finding during functional tasks with min assist multimodal cues.   SLP Short Term Goal 2 (Week 2): Pt will complete basic, familiar tasks with min assist multimodal cues for functional problem solving.   SLP Short Term Goal 3 (Week 2): Pt will sustain her attention to basic, familiar tasks for 15 minutes with min assist multimodal cues for redirection.    Skilled Therapeutic Interventions:  Pt was seen for skilled ST targeting cognitive goals.  SLP facilitated the session with math calculations from word problems to address functional problem solving.  Pt needed min assist verbal cues for task organization and working memory when completing functional math calculations.  Pt was returned to room and left in wheelchair with chair alarm set and call bell within reach.  Continue per current plan of care.    Pain Pain Assessment Pain Scale: 0-10 Pain Score: 0-No pain  Therapy/Group: Individual Therapy  [name] [name], Selinda Orion 06/07/2018, 12:38 PM

## 2018-06-08 ENCOUNTER — Inpatient Hospital Stay (HOSPITAL_COMMUNITY): Payer: Medicaid Other | Admitting: Physical Therapy

## 2018-06-08 DIAGNOSIS — S83271S Complex tear of lateral meniscus, current injury, right knee, sequela: Secondary | ICD-10-CM

## 2018-06-08 LAB — BASIC METABOLIC PANEL
ANION GAP: 9 (ref 5–15)
BUN: 8 mg/dL (ref 6–20)
CO2: 27 mmol/L (ref 22–32)
Calcium: 9.3 mg/dL (ref 8.9–10.3)
Chloride: 107 mmol/L (ref 98–111)
Creatinine, Ser: 0.7 mg/dL (ref 0.44–1.00)
GFR calc Af Amer: >60 mL/min (ref >60)
GFR calc non Af Amer: >60 mL/min (ref >60)
GLUCOSE: 89 mg/dL (ref 70–99)
Potassium: 4 mmol/L (ref 3.5–5.1)
Sodium: 143 mmol/L (ref 135–145)

## 2018-06-08 NOTE — Progress Notes (Signed)
[name] [name] is a 32 y.o. female 02/27/1986 [id number]  Subjective: No new complaints. No new problems.   Objective: Vital signs in last 24 hours: Temp:  [97.6 F (36.4 C)-98.6 F (37 C)] 98.6 F (37 C) (12/21 0536) Pulse Rate:  [66-83] 66 (12/21 0536) Resp:  [16-19] 16 (12/21 0536) BP: (101-118)/(71-92) 101/76 (12/21 0536) SpO2:  [98 %-100 %] 98 % (12/21 0536) Weight change:  Last BM Date: 06/05/18  Intake/Output from previous day: 12/20 0701 - 12/21 0700 In: 762 [P.O.:762] Out: -  Last cbgs: CBG (last 3)  No results for input(s): GLUCAP in the last 72 hours.   Physical Exam General: No apparent distress   HEENT: not dry Lungs: Normal effort. Lungs clear to auscultation, no crackles or wheezes. Cardiovascular: Regular rate and rhythm, no edema Abdomen: S/NT/ND; BS(+) Musculoskeletal:  unchanged Neurological: No new neurological deficits Wounds: Clean Skin: clear   Mental state: Alert, aphasic, cooperative    Lab Results: BMET    Component Value Date/Time   NA 143 06/08/2018 0513   NA 142 08/01/2017 0934   K 4.0 06/08/2018 0513   CL 107 06/08/2018 0513   CO2 27 06/08/2018 0513   GLUCOSE 89 06/08/2018 0513   GLUCOSE 70 08/25/2014 1214   BUN 8 06/08/2018 0513   BUN 6 08/01/2017 0934   CREATININE 0.70 06/08/2018 0513   CREATININE 0.40 (L) 06/29/2016 1504   CALCIUM 9.3 06/08/2018 0513   GFRNONAA >60 06/08/2018 0513   GFRNONAA >89 07/02/2015 0945   GFRAA >60 06/08/2018 0513   GFRAA >89 07/02/2015 0945   CBC    Component Value Date/Time   WBC 3.1 (L) 06/06/2018 0537   RBC 3.64 (L) 06/06/2018 0537   HGB 11.4 (L) 06/06/2018 0537   HGB 11.0 (L) 09/25/2016 0902   HCT 35.9 (L) 06/06/2018 0537   HCT 33.9 (L) 09/25/2016 0902   PLT 163 06/06/2018 0537   PLT 198 09/25/2016 0902   MCV 98.6 06/06/2018 0537   MCV 98 (H) 09/25/2016 0902   MCH 31.3 06/06/2018 0537   MCHC 31.8 06/06/2018 0537   RDW 12.2 06/06/2018 0537   RDW 12.9 09/25/2016 0902    LYMPHSABS 1.6 06/06/2018 0537   MONOABS 0.3 06/06/2018 0537   EOSABS 0.1 06/06/2018 0537   BASOSABS 0.0 06/06/2018 0537    Studies/Results: Mr Knee Right Wo Contrast  Result Date: 06/07/2018 CLINICAL DATA:  Right calf DVT with cystic structure in the popliteal fossa. EXAM: MRI OF THE RIGHT KNEE WITHOUT CONTRAST TECHNIQUE: Multiplanar, multisequence MR imaging of the knee was performed. No intravenous contrast was administered. COMPARISON:  None. FINDINGS: MENISCI Medial meniscus:  Intact. Lateral meniscus: Complex tear of the anterior root and horn extending to body junction with borderline extrusion of the body. LIGAMENTS Cruciates:  Intact ACL and PCL.  Mucoid degeneration of the ACL. Collaterals: Medial collateral ligament is intact. Prominent edema adjacent to the proximal MCL. Lateral collateral ligament complex is intact. CARTILAGE Patellofemoral:  No chondral defect. Medial:  Mild partial-thickness cartilage loss. Lateral:  Mild partial-thickness cartilage loss. Joint: Small to moderate joint effusion. Mild edema within Hoffa's fat. No plical thickening. Popliteal Fossa:  Tiny Baker cyst.  Intact popliteus tendon. Extensor Mechanism: Intact quadriceps tendon and patellar tendon. Intact medial and lateral patellar retinaculum. Intact MPFL. Bones: No acute fracture or dislocation. Small focus of marrow edema in the posterior lateral tibial plateau. No focal bone lesion. Other: None. IMPRESSION: 1. Tiny Baker cyst, likely corresponding to the cystic structure seen in the popliteal  fossa on recent right lower extremity ultrasound. 2. Complex tear of the lateral meniscus anterior root and horn extending to the body junction with borderline extrusion of the body. 3. Mild edema tracks adjacent to the MCL. This can be incidental but in the appropriate clinical circumstance could represent grade 1 sprain. 4. Mild medial and lateral compartment osteoarthritis. Electronically Signed   By: Titus Dubin M.D.    On: 06/07/2018 02:12    Medications: I have reviewed the patient's current medications.  Assessment/Plan:  1.  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  Status post rupture of a COM aneurysm.  Continue CIR. 2.  DVT prophylaxis with SCD.  The right peroneal and right posterior tibial DVT.  Monitoring for symptoms 3.  Pain management with Tylenol.  The right knee pathology evaluated with a knee MRI 4.  Emotional support 5.  Acute blood loss anemia.  Continue to monitor CBC 6.  Impaired fasting glucose.  Hemoglobin A1c was normal 7.  Hypokalemia.  Replaced 8.  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  Baclofen 9.  Leukopenia.  Monitor CBC 10.  Hypertension.  On amlodipine.      Length of stay, days: 12  Walker Kehr , MD 06/08/2018, 11:35 AM

## 2018-06-09 ENCOUNTER — Inpatient Hospital Stay (HOSPITAL_COMMUNITY): Payer: Medicaid Other | Admitting: Occupational Therapy

## 2018-06-09 NOTE — Plan of Care (Signed)
  Problem: RH BOWEL ELIMINATION Goal: RH STG MANAGE BOWEL WITH ASSISTANCE Description STG Manage Bowel with Assistance. max  Outcome: Progressing   Problem: RH BLADDER ELIMINATION Goal: RH STG MANAGE BLADDER WITH ASSISTANCE Description STG Manage Bladder With Assistance. Max  Outcome: Progressing   Problem: RH SAFETY Goal: RH STG ADHERE TO SAFETY PRECAUTIONS W/ASSISTANCE/DEVICE Description STG Adhere to Safety Precautions With Assistance/Device. max  Outcome: Progressing   Problem: RH COGNITION-NURSING Goal: RH STG USES MEMORY AIDS/STRATEGIES W/ASSIST TO PROBLEM SOLVE Description STG Uses Memory Aids/Strategies With Assistance to Problem Solve. Max  Outcome: Progressing   Problem: RH KNOWLEDGE DEFICIT Goal: RH STG INCREASE KNOWLEDGE OF HYPERTENSION Description Patient and family will be able to verbalize target blood pressure, how to take blood pressure, and medication for HTN  Outcome: Progressing

## 2018-06-09 NOTE — Progress Notes (Signed)
[name] [name] is a 32 y.o. female 01-31-1986 [id number]  Subjective: No new complaints. No new problems. Slept well. Feeling OK.  Objective: Vital signs in last 24 hours: Temp:  [97.7 F (36.5 C)-98.5 F (36.9 C)] 98.1 F (36.7 C) (12/22 0416) Pulse Rate:  [62-75] 62 (12/22 0416) Resp:  [16-18] 18 (12/22 0416) BP: (112-115)/(71-75) 113/74 (12/22 0416) SpO2:  [99 %-100 %] 99 % (12/22 0416) Weight:  [89.1 kg-89.2 kg] 89.1 kg (12/22 0416) Weight change:  Last BM Date: 06/08/18  Intake/Output from previous day: 12/21 0701 - 12/22 0700 In: 662 [P.O.:662] Out: -  Last cbgs: CBG (last 3)  No results for input(s): GLUCAP in the last 72 hours.   Physical Exam General: No apparent distress   HEENT: not dry Lungs: Normal effort. Lungs clear to auscultation, no crackles or wheezes. Cardiovascular: Regular rate and rhythm, no edema Abdomen: S/NT/ND; BS(+) Musculoskeletal:  unchanged Neurological: No new neurological deficits Wounds: N/A    Skin: clear   Mental state: Alert, aphasic, cooperative    Lab Results: BMET    Component Value Date/Time   NA 143 06/08/2018 0513   NA 142 08/01/2017 0934   K 4.0 06/08/2018 0513   CL 107 06/08/2018 0513   CO2 27 06/08/2018 0513   GLUCOSE 89 06/08/2018 0513   GLUCOSE 70 08/25/2014 1214   BUN 8 06/08/2018 0513   BUN 6 08/01/2017 0934   CREATININE 0.70 06/08/2018 0513   CREATININE 0.40 (L) 06/29/2016 1504   CALCIUM 9.3 06/08/2018 0513   GFRNONAA >60 06/08/2018 0513   GFRNONAA >89 07/02/2015 0945   GFRAA >60 06/08/2018 0513   GFRAA >89 07/02/2015 0945   CBC    Component Value Date/Time   WBC 3.1 (L) 06/06/2018 0537   RBC 3.64 (L) 06/06/2018 0537   HGB 11.4 (L) 06/06/2018 0537   HGB 11.0 (L) 09/25/2016 0902   HCT 35.9 (L) 06/06/2018 0537   HCT 33.9 (L) 09/25/2016 0902   PLT 163 06/06/2018 0537   PLT 198 09/25/2016 0902   MCV 98.6 06/06/2018 0537   MCV 98 (H) 09/25/2016 0902   MCH 31.3 06/06/2018 0537   MCHC 31.8  06/06/2018 0537   RDW 12.2 06/06/2018 0537   RDW 12.9 09/25/2016 0902   LYMPHSABS 1.6 06/06/2018 0537   MONOABS 0.3 06/06/2018 0537   EOSABS 0.1 06/06/2018 0537   BASOSABS 0.0 06/06/2018 0537    Studies/Results: No results found.  Medications: I have reviewed the patient's current medications.  Assessment/Plan:   1.  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  Status post rupture of a COM aneurysm.  Continue with CIR 2.  DVT prophylaxis with SCD.  Recent history of the right peroneal and right posterior tibial DVT.  Continue to monitor for DVT symptoms 3.  Pain management with Tylenol.  The right knee pathology was evaluated with a knee MRI 4.  Emotional support 5.  Acute blood loss anemia.  Continue to monitor CBC 6.  Impaired fasting glucose.  Hemoglobin A1c was normal - continue to monitor 7.  Hypokalemia, potassium was restored 8.  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  Continue with baclofen 9.  Leukopenia.  Continue to monitor CBC 10.  Hypertension.  Continue with amlodipine     Length of stay, days: 13  Walker Kehr , MD 06/09/2018, 12:41 PM

## 2018-06-09 NOTE — Progress Notes (Signed)
Occupational Therapy Session Note  Patient Details  Name: [name] [name] MRN: [id number] Date of Birth: 13-Feb-1986  Today's Date: 06/09/2018 OT Individual Time: 1030-1100 OT Individual Time Calculation (min): 30 min    Short Term Goals: Week 2:  OT Short Term Goal 1 (Week 2): Pt will complete squat pivot transfer to toilet/BSC with +1 assist OT Short Term Goal 2 (Week 2): Pt will stand to complete 1 grooming task at sink with max A standing balance in order to address functional standing balance/endurance OT Short Term Goal 3 (Week 2): Pt will initiate use of R UE during functional task with min cuing OT Short Term Goal 4 (Week 2): Pt will complete sit>stand from EOB with mod A +1 during dressing routine  Skilled Therapeutic Interventions/Progress Updates:    Pt seen for OT session focusing on ADL re-training. Pt in supine upon arrival, denied pain at rest and agreeable to tx session. Pt in soiled brief, changed brief total A at bed level with min A for rolling using hospital bed functions. Pt able to complete per-care and buttock hygiene from supine/ side-lying with set-up.  She transferred to EOB with min A for management of R LE, able to bring trunk upright with increased time and close supervision. She dressed seated on EOB, CGA for unsupported dynamic sitting balance during dressing task. With min A able to cross R LE into figure four to thread pants on. She stood from elevated EOB with min A, +2 for safety at [facility]. Pt required B UE support on RW for balance and +2 to pull pants up. Pt with increase in R knee pain upon standing, 7/10- RN made aware and administered medication during session.  Completed mod A slide board transfer to w/c. Pt taken to therapy day room with hand off to music dance group.   Therapy Documentation Precautions:  Precautions Precautions: Fall Precaution Comments: right hemiplegia, pushes right Restrictions Weight Bearing Restrictions: No   Therapy/Group:  Individual Therapy  Zofia Peckinpaugh L 06/09/2018, 6:47 AM

## 2018-06-10 ENCOUNTER — Inpatient Hospital Stay (HOSPITAL_COMMUNITY): Payer: Medicaid Other | Admitting: Speech Pathology

## 2018-06-10 ENCOUNTER — Inpatient Hospital Stay (HOSPITAL_COMMUNITY): Payer: Medicaid Other | Admitting: Occupational Therapy

## 2018-06-10 ENCOUNTER — Inpatient Hospital Stay (HOSPITAL_COMMUNITY): Payer: Medicaid Other | Admitting: Physical Therapy

## 2018-06-10 DIAGNOSIS — S83271D Complex tear of lateral meniscus, current injury, right knee, subsequent encounter: Secondary | ICD-10-CM

## 2018-06-10 LAB — CBC
HCT: 36.1 % (ref 36.0–46.0)
HEMOGLOBIN: 11.3 g/dL — AB (ref 12.0–15.0)
MCH: 30.9 pg (ref 26.0–34.0)
MCHC: 31.3 g/dL (ref 30.0–36.0)
MCV: 98.6 fL (ref 80.0–100.0)
Platelets: 185 10*3/uL (ref 150–400)
RBC: 3.66 MIL/uL — AB (ref 3.87–5.11)
RDW: 12 % (ref 11.5–15.5)
WBC: 3.5 10*3/uL — ABNORMAL LOW (ref 4.0–10.5)
nRBC: 0 % (ref 0.0–0.2)

## 2018-06-10 LAB — BASIC METABOLIC PANEL
ANION GAP: 11 (ref 5–15)
BUN: 6 mg/dL (ref 6–20)
CO2: 26 mmol/L (ref 22–32)
Calcium: 9.6 mg/dL (ref 8.9–10.3)
Chloride: 107 mmol/L (ref 98–111)
Creatinine, Ser: 0.62 mg/dL (ref 0.44–1.00)
GFR calc Af Amer: >60 mL/min (ref >60)
GFR calc non Af Amer: >60 mL/min (ref >60)
Glucose, Bld: 91 mg/dL (ref 70–99)
Potassium: 4.2 mmol/L (ref 3.5–5.1)
Sodium: 144 mmol/L (ref 135–145)

## 2018-06-10 MED ORDER — AMLODIPINE BESYLATE 2.5 MG PO TABS
2.5000 mg | ORAL_TABLET | Freq: Every day | ORAL | Status: DC
  Administered 2018-06-11 – 2018-06-19 (×9): 2.5 mg via ORAL
  Filled 2018-06-10 (×10): qty 1

## 2018-06-10 NOTE — Progress Notes (Signed)
Speech Language Pathology Daily Session Note  Patient Details  Name: [name] [name] MRN: [id number] Date of Birth: [birth year]  Today's Date: 06/10/2018 SLP Individual Time: 1330-1415 SLP Individual Time Calculation (min): 45 min  Short Term Goals: Week 2: SLP Short Term Goal 1 (Week 2): Pt will use compensatory strategies for mildly complex word finding during functional tasks with min assist multimodal cues.   SLP Short Term Goal 2 (Week 2): Pt will complete basic, familiar tasks with min assist multimodal cues for functional problem solving.   SLP Short Term Goal 3 (Week 2): Pt will sustain her attention to basic, familiar tasks for 15 minutes with min assist multimodal cues for redirection.    Skilled Therapeutic Interventions:  Skilled treatment session focused on cognition goals. SLP facilitiated session by providing Min A cues to recall therapies from earlier in day, recall therapists' names and 2 activities within each therapy. Pt with decreased processing times over previous session with this therapist. TV left on for additional distractions and pt able to demonstrate selective attention with Min A question cues for ~ 20 minute intervals. Pt was left upright in wheelchair, lap alarm on and all needs within reach. Continue per current plan of care.      Pain Pain Assessment Pain Scale: 0-10 Pain Score: 0-No pain  Therapy/Group: Individual Therapy  [name] [name] 06/10/2018, 2:18 PM

## 2018-06-10 NOTE — Progress Notes (Signed)
Discontinuing telesitter. Nursing and therapy agree pt stable and calls appropriate for care. No impulsivity noted. Notified PA and Tele[facility]itoring [facility]. Will cont to monitor Erie Noe, LPN

## 2018-06-10 NOTE — Progress Notes (Signed)
[place] PHYSICAL MEDICINE & REHABILITATION PROGRESS NOTE  Subjective/Complaints: Patient seen sitting up in bed this morning about to work with therapies.  She states she slept well overnight.  She states she had a good weekend.  Knee pain improved with Voltaren gel  ROS: Denies CP, shortness of breath, nausea, vomiting, diarrhea.  Objective: Vital Signs: Blood pressure 97/66, pulse 72, temperature 98.8 F (37.1 C), temperature source Oral, resp. rate 17, height 5\' 10"  (1.778 m), weight 89 kg, SpO2 98 %, currently breastfeeding. No results found. Recent Labs    06/10/18 0535  WBC 3.5*  HGB 11.3*  HCT 36.1  PLT 185   Recent Labs    06/08/18 0513 06/10/18 0535  NA 143 144  K 4.0 4.2  CL 107 107  CO2 27 26  GLUCOSE 89 91  BUN 8 6  CREATININE 0.70 0.62  CALCIUM 9.3 9.6    Physical Exam: BP 97/66 (BP Location: Left Arm)   Pulse 72   Temp 98.8 F (37.1 C) (Oral)   Resp 17   Ht 5\' 10"  (1.778 m)   Wt 89 kg   SpO2 98%   BMI 28.15 kg/m  Constitutional: No distress . Vital signs reviewed.  Obese. HENT: Normocephalic.  Atraumatic. Eyes: EOMI. No discharge. Cardiovascular: RRR.  No JVD. Respiratory: CTA bilaterally.  Normal effort. GI: BS +. Non-distended. Musc: No edema or tenderness in extremities. Neurological: She is alert Delayed processing, improving.  Right upper extremity: Trace movements in hand Right lower extremity: 0/5 proximal to distal Emerging tone on right side Left upper extremity: 4/5 proximal distal, stable Left lower extremity: 4-/5 proximal distal, stable Skin: She is not diaphoretic. Crani incision c/d/i  Psychiatric: Improving affect    Assessment/Plan: 1. Functional deficits secondary to left ACA with SAH which require 3+ hours per day of interdisciplinary therapy in a comprehensive inpatient rehab setting.  Physiatrist is providing close team supervision and 24 hour management of active medical problems listed below.  Physiatrist and  rehab team continue to assess barriers to discharge/monitor patient progress toward functional and medical goals  Care Tool:  Bathing    Body parts bathed by patient: Chest, Abdomen, Right arm, Right upper leg, Left upper leg, Face, Front perineal area, Right lower leg, Left lower leg   Body parts bathed by helper: Left arm, Buttocks     Bathing assist Assist Level: Moderate Assistance - Patient 50 - 74%     Upper Body Dressing/Undressing Upper body dressing   What is the patient wearing?: Pull over shirt    Upper body assist Assist Level: Minimal Assistance - Patient > 75%    Lower Body Dressing/Undressing Lower body dressing      What is the patient wearing?: Incontinence brief, Pants     Lower body assist Assist for lower body dressing: Maximal Assistance - Patient 25 - 49%     Toileting Toileting Toileting Activity did not occur (Clothing management and hygiene only): Safety/medical concerns  Toileting assist Assist for toileting: Total Assistance - Patient < 25%     Transfers Chair/bed transfer  Transfers assist  Chair/bed transfer activity did not occur: N/A  Chair/bed transfer assist level: Minimal Assistance - Patient > 75%     Locomotion Ambulation   Ambulation assist      Assist level: 2 helpers Assistive device: Walker-rolling(knee immobilizer) Max distance: 11   Walk 10 feet activity   Assist     Assist level: 2 helpers     Walk 50 feet  activity   Assist Walk 50 feet with 2 turns activity did not occur: Safety/medical concerns         Walk 150 feet activity   Assist Walk 150 feet activity did not occur: Safety/medical concerns         Walk 10 feet on uneven surface  activity   Assist Walk 10 feet on uneven surfaces activity did not occur: Safety/medical concerns         Wheelchair     Assist Will patient use wheelchair at discharge?: (TBD)   Wheelchair activity did not occur: Safety/medical  concerns  Wheelchair assist level: Supervision/Verbal cueing Max wheelchair distance: 150'    Wheelchair 50 feet with 2 turns activity    Assist    Wheelchair 50 feet with 2 turns activity did not occur: Safety/medical concerns   Assist Level: Supervision/Verbal cueing   Wheelchair 150 feet activity     Assist Wheelchair 150 feet activity did not occur: Safety/medical concerns   Assist Level: Supervision/Verbal cueing      Medical Problem List and Plan: 1.  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  Continue CIR   Weekend notes reviewed 2.  DVT Prophylaxis/Anticoagulation: Mechanical: Sequential compression devices, below knee Bilateral lower extremities Right peroneal and posterior tibial vein acute DVT-repeat Dopplers suggesting persistent DVT without propagation, will plan for repeat on 12/25 3. Pain Management: Tylenol prn.   Right knee pain-, MRA reviewed no significant source identified.  Appreciate vascular Recs, MRI reviewed showing Baker's cyst, lateral meniscal tear, and OA.     Voltaren gel    Knee immobilizer 4. Mood: Team to provide ego support.  LCSW to follow for evaluation and support.  5. Neuropsych: This patient is not fully capable of making decisions on her own behalf. 6. Skin/Wound Care: Monitor incision for healing.  7. Fluids/Electrolytes/Nutrition: Monitor I/Os. Offer supplements if intake poor.   BMP normal on 12/23 8. HTN: Monitor BP bid  Amlodipine decreased to 7.5 on 12/18, decreased to 5 on 12/20, decreased to 2.5 on 12/24   Vitals:   06/09/18 1928 06/10/18 0405  BP: 113/83 97/66  Pulse: 63 72  Resp: 16 17  Temp: 98.9 F (37.2 C) 98.8 F (37.1 C)  SpO2: 100% 98%  9. Moraxella HCAP: Has completed antibiotic regimen on 12/4 10. ABLA: Continue to monitor H/H serially.   Hemoglobin 11.3 on 12/23  Continue to monitor 11.  Impaired fasting glucose: Likely stress  related.   HbA1c 4.3 on 12/10- normal 12.  HypoK - supplement KCL  Potassium 4.2 on 12/23  Continue to monitor 13.  Spastic hemiplegia  Right lower extremity with increased hamstring tone  Tizanidine qhs DC'd on 12/19  Baclofen 5 3 times daily started on 12/19  Cont ROM   Educated regarding appropriate positioning  Improving 14.  Leukopenia  WBCs 3.5 on 12/23  Continue to monitor   LOS: 14 days A FACE TO FACE EVALUATION WAS PERFORMED  Ankit Lorie Phenix 06/10/2018, 9:25 AM

## 2018-06-10 NOTE — Progress Notes (Signed)
Physical Therapy Weekly Progress Note  Patient Details  Name: [name] [name] MRN: [id number] Date of Birth: 1986-02-04  Beginning of progress report period: June 04, 2018 End of progress report period: June 10, 2018  Today's Date: 06/10/2018 PT Individual Time: 3536-1443 PT Individual Time Calculation (min): 60 min   Patient has met 2 of 3 short term goals.  Patient currently requires minA for bed mobility to manage RLE, min guard to close S for slideboard transfers, min/modA sit <>stand with RW, modA +2 for w/c follow for gait up to 15' with RW and knee immobilizer. Pt limited by new knee immobilizer however does demonstrate increased confidence in weight bearing RLE during standing/gait.   Patient continues to demonstrate the following deficits muscle weakness and muscle paralysis, decreased cardiorespiratoy endurance, impaired timing and sequencing, abnormal tone, unbalanced muscle activation, decreased coordination and decreased motor planning, decreased awareness, decreased problem solving, decreased safety awareness and delayed processing and decreased sitting balance, decreased standing balance, decreased postural control, hemiplegia and decreased balance strategies and therefore will continue to benefit from skilled PT intervention to increase functional independence with mobility.  Patient progressing toward long term goals..  Plan of care revisions: upgraded sit <>stand, standing balance and transfers to S overall d/t progress..  PT Short Term Goals Week 2:  PT Short Term Goal 1 (Week 2): Pt will perform gait x25' maxA +1 PT Short Term Goal 1 - Progress (Week 2): Progressing toward goal PT Short Term Goal 2 (Week 2): Pt will perform w/c propulsion x150' with min guard PT Short Term Goal 2 - Progress (Week 2): Met PT Short Term Goal 3 (Week 2): Pt will demo dynamic standing balance x3 min with modA PT Short Term Goal 3 - Progress (Week 2): Met Week 3:  PT Short Term Goal 1  (Week 3): Pt will perform slideboard transfers consistent S PT Short Term Goal 2 (Week 3): Pt will perform bed mobility min guard PT Short Term Goal 3 (Week 3): Pt will ambulate x25' modA +1  Skilled Therapeutic Interventions/Progress Updates: Pt received seated in bed, c/o pain as below and agreeable to treatment. Long sitting>sitting EOB minA for RLE management. Lateral scoot transfer R/L throughout session to level surfaces with transfer board and min guard/close S. Sit <>supine minA for RLE management. RUE PNF D1 flexion/extension with rhythmic initiation PROM>AAROM to facilitate shoulder activation. Sit <>stand with dynamic LUE reaching to retrieve horseshoes with min guard overall however 2 instances of LOB to R side and sat back on mat for safety. Stand pivot modA for RLE placement and stance control. Gait x2 trials 15' each with RW and modA, +2 w/c follow for safety; second trial added dorsiflexion ace wrap for improved foot clearance during swing. Returned to bed with min guard slideboard transfer as above. Remained seated in bed at end of session, alarm intact and all needs in reach.      Therapy Documentation Precautions:  Precautions Precautions: Fall Precaution Comments: right hemiplegia, pushes right Restrictions Weight Bearing Restrictions: No Pain: Pain Assessment Pain Scale: 0-10 Pain Score: 3  Pain Type: Acute pain Pain Location: Knee Pain Orientation: Right Pain Descriptors / Indicators: Aching Pain Frequency: Intermittent Pain Onset: On-going Patients Stated Pain Goal: 1 Pain Intervention(s): Repositioned(voltaren gel)   Therapy/Group: Individual Therapy  Corliss Skains 06/10/2018, 10:12 AM

## 2018-06-10 NOTE — Progress Notes (Signed)
Occupational Therapy Session Note  Patient Details  Name: [name] [name] MRN: [id number] Date of Birth: March 01, 1986  Today's Date: 06/10/2018 OT Individual Time: 2111-7356 OT Individual Time Calculation (min): 35 min    Short Term Goals: Week 1:  OT Short Term Goal 1 (Week 1): Pt will consistently complete basic transfers with +1 assist in order to reduce caregiver burden OT Short Term Goal 1 - Progress (Week 1): Met OT Short Term Goal 2 (Week 1): Pt will recall hemi-dressing technique during UB dressing with min cuing OT Short Term Goal 2 - Progress (Week 1): Met OT Short Term Goal 3 (Week 1): Pt will complete sit>stand at sink with +1 assist during LB dressing task in order to reduce caregiver burden OT Short Term Goal 3 - Progress (Week 1): Met Week 2:  OT Short Term Goal 1 (Week 2): Pt will complete squat pivot transfer to toilet/BSC with +1 assist OT Short Term Goal 2 (Week 2): Pt will stand to complete 1 grooming task at sink with max A standing balance in order to address functional standing balance/endurance OT Short Term Goal 3 (Week 2): Pt will initiate use of R UE during functional task with min cuing OT Short Term Goal 4 (Week 2): Pt will complete sit>stand from EOB with mod A +1 during dressing routine      Skilled Therapeutic Interventions/Progress Updates:    Pt received in bed and agreeable to getting dressed to prepare for her PT appt.  She requested her brief be changed as she was wet.  Pt worked on rolling in bed to L side with CGA and to R with S. She cleansed her perineal area and L buttock.  Briefs and TEDs donned from supine.  Pt sat to EOB with min A and then maintained balance with close S and then min A when she was reaching toward her feet.  Encouraged active movement of her R arm with donning cami top and shirt with min A.  Pt donned pants over R foot with min A and then L with S. She layed back on bed to work on bridging to pull pants over hips with mod A.  Pt  resting in bed with alarm on , telesitter on.  All needs met.  Therapy Documentation Precautions:  Precautions Precautions: Fall Precaution Comments: right hemiplegia, pushes right Restrictions Weight Bearing Restrictions: No    Pain: Pain Assessment Pain Scale: 0-10 Pain Score: 3  Pain Type: Acute pain Pain Location: Knee Pain Orientation: Right Pain Descriptors / Indicators: Aching Pain Frequency: Intermittent Pain Onset: On-going Patients Stated Pain Goal: 1 Pain Intervention(s): Repositioned(voltaren gel)   Therapy/Group: Individual Therapy  Brilynn Biasi 06/10/2018, 9:21 AM

## 2018-06-10 NOTE — Plan of Care (Signed)
  Problem: RH Balance Goal: LTG Patient will maintain dynamic standing balance (PT) Description LTG:  Patient will maintain dynamic standing balance with assistance during mobility activities (PT) Flowsheets (Taken 06/10/2018 1146) LTG: Pt will maintain dynamic standing balance during mobility activities with:: Supervision/Verbal cueing (upgraded d/t progress) Note:  Upgraded d/t progress   Problem: Sit to Stand Goal: LTG:  Patient will perform sit to stand with assistance level (PT) Description LTG:  Patient will perform sit to stand with assistance level (PT) Flowsheets (Taken 06/10/2018 1146) LTG: PT will perform sit to stand in preparation for functional mobility with assistance level: Supervision/Verbal cueing (upgraded d/t progress) Note:  Upgraded d/t progress   Problem: RH Bed to Chair Transfers Goal: LTG Patient will perform bed/chair transfers w/assist (PT) Description LTG: Patient will perform bed to chair transfers with assistance (PT). Flowsheets (Taken 06/10/2018 1146) LTG: Pt will perform Bed to Chair Transfers with assistance level  : Supervision/Verbal cueing (upgraded d/t progress) Note:  Upgraded d/t progress   Problem: RH Car Transfers Goal: LTG Patient will perform car transfers with assist (PT) Description LTG: Patient will perform car transfers with assistance (PT). Flowsheets (Taken 06/10/2018 1146) LTG: Pt will perform car transfers with assist:: Supervision/Verbal cueing (upgraded d/t progress) Note:  Upgraded d/t progress

## 2018-06-10 NOTE — Progress Notes (Signed)
Occupational Therapy Session Note  Patient Details  Name: [name] [name] MRN: [id number] Date of Birth: 1985-08-06  Today's Date: 06/10/2018 OT Individual Time: 1100-1200 OT Individual Time Calculation (min): 60 min    Short Term Goals: Week 2:  OT Short Term Goal 1 (Week 2): Pt will complete squat pivot transfer to toilet/BSC with +1 assist OT Short Term Goal 2 (Week 2): Pt will stand to complete 1 grooming task at sink with max A standing balance in order to address functional standing balance/endurance OT Short Term Goal 3 (Week 2): Pt will initiate use of R UE during functional task with min cuing OT Short Term Goal 4 (Week 2): Pt will complete sit>stand from EOB with mod A +1 during dressing routine  Skilled Therapeutic Interventions/Progress Updates:    Pt seen for OT session focusing on ADL re-training and functional standing balance/endurance. Pt sitting upright in bed upon arrival, denied pain and agreeable to tx session. She transferered to sitting EOB with increased time, able to manage R LE off EOB with increased time using L UE. Throughout session, she completed sliding board transfers with overall min A with assist for set-up and stabilization of equipment and VCs for head/hip relationship. She self propelled w/c throughout unit with supervision using hemi technique. Seated on EOM in therapy gym, pt able to doff shoes and therapist replaced standard shoelaces with elastic shoelaces. Due to pt's baseline visual deficits, she is unable to see shoe button for use. She was able to don shoes with new places with supervision. Completed sit>stands from EOB, min A with increased time to come into standing at RW, R knee blocked y therapist as well as R knee immobilizer don. Addressed functional standing balance without UE support. Pt able to let go of walker with L UE and maintain static standing with min A overall. Able to self correct slight LOB episodes to the L. Pt completed stack  cupping activitity in standing position in prep for functional standing tasks. Completed x3 trials, tolerating ~2 minutes standing each trial before requiring seated rest break.  Pt's brief noted to be soiled, therefore returned to w/c and returned to room. She was able to stand at sink with B UE support while total A +2 provided to perform hygiene and don new brief. Pt able to assist with pulling pants up and maintaining balance with min-mod A +1 for balance. Pt left seated in w/c at end of session, set-up with meal tray, all needs in reach and chair belt alarm donned. Pt noted to be more flat today. Pt reports being sad about CLOF and desiring to "just go home" provided empathetic listening and support. Pt now appropriate for neuro-psych assessment, made CSW aware.   Therapy Documentation Precautions:  Precautions Precautions: Fall Precaution Comments: right hemiplegia, pushes right Restrictions Weight Bearing Restrictions: No Pain:   No/denies pain   Therapy/Group: Individual Therapy  [name] L 06/10/2018, 6:36 AM

## 2018-06-11 ENCOUNTER — Inpatient Hospital Stay (HOSPITAL_COMMUNITY): Payer: Medicaid Other | Admitting: Occupational Therapy

## 2018-06-11 ENCOUNTER — Inpatient Hospital Stay (HOSPITAL_COMMUNITY): Payer: Medicaid Other | Admitting: Speech Pathology

## 2018-06-11 ENCOUNTER — Inpatient Hospital Stay (HOSPITAL_COMMUNITY): Payer: Medicaid Other | Admitting: Physical Therapy

## 2018-06-11 NOTE — Progress Notes (Signed)
Physical Therapy Session Note  Patient Details  Name: [name] [name] MRN: [id number] Date of Birth: 07/04/1985  Today's Date: 06/11/2018 PT Individual Time: 1230-1330 PT Individual Time Calculation (min): 60 min   Short Term Goals: Week 3:  PT Short Term Goal 1 (Week 3): Pt will perform slideboard transfers consistent S PT Short Term Goal 2 (Week 3): Pt will perform bed mobility min guard PT Short Term Goal 3 (Week 3): Pt will ambulate x25' modA +1  Skilled Therapeutic Interventions/Progress Updates: Pt received seated in w/c, denies pain and agreeable to treatment. Reports no need to use restroom and brief clean. W/c propulsion throughout session with L hemi technique and S; attempted utilizing R hand during propulsion and pt able to grip w/c rim however lacks shoulder AROM for posterior placement/pushing. Lateral scoot w/c <>car with transfer board and S to L into car, minA to R out of car. Unable to get RLE into car d/t KI and high floor boards. Returned to room d/t therapist noting chuck pad in w/c was wet. Upon standing with RW min guard pt's brief noted to be saturated and pt with no awareness. Standing balance min guard/minA d/t occasional LOB to R side while therapist performed hygiene and clothing management totalA. Returned to gym, transferred S to mat table with transfer board. Sit <>stand x4 trials with min guard, occasionally multiple attempts required d/t LOB to R. Attempted LLE toe taps to 3" step for forced use and WB RLE however pt hesitant and unable to perform full step, able to step forward/backward with LLE on level floor only. Gait x2 trials 10' each with RW and modA overall for RLE progression and stance control; pt initiates RW placement and only occasional verbal cues required for correct distance. Seated RUE dynavision with AAROM for shoulder flexion; average reaction time 5 sec, performing 20-23 hits per 2 min trial, x2 trials. Returned to room totalA; transfer to bed min  guard with transfer board to R side. MinA sit >supine. Remained in bed, alarm and 4 bedrails intact per pt request, all needs in reach.      Therapy Documentation Precautions:  Precautions Precautions: Fall Precaution Comments: right hemiplegia, pushes right Restrictions Weight Bearing Restrictions: No Pain: Pain Assessment Pain Scale: 0-10 Pain Score: 0-No pain    Therapy/Group: Individual Therapy  Corliss Skains 06/11/2018, 1:32 PM

## 2018-06-11 NOTE — Progress Notes (Signed)
Speech Language Pathology Weekly Progress and Session Note  Patient Details  Name: Natash Berman MRN: [id number] Date of Birth: 1985-07-13  Beginning of progress report period: 06/04/2018 End of progress report period: 06/11/2018   Today's Date: 06/11/2018 SLP Individual Time: 0539-7673 SLP Individual Time Calculation (min): 45 min  Short Term Goals: Week 2: SLP Short Term Goal 1 (Week 2): Pt will use compensatory strategies for mildly complex word finding during functional tasks with min assist multimodal cues.   SLP Short Term Goal 1 - Progress (Week 2): Met SLP Short Term Goal 2 (Week 2): Pt will complete basic, familiar tasks with min assist multimodal cues for functional problem solving.   SLP Short Term Goal 2 - Progress (Week 2): Met SLP Short Term Goal 3 (Week 2): Pt will sustain her attention to basic, familiar tasks for 15 minutes with min assist multimodal cues for redirection.   SLP Short Term Goal 3 - Progress (Week 2): Met    New Short Term Goals: Week 3: SLP Short Term Goal 1 (Week 3): Pt will use compensatory strategies for mildly complex word finding during functional tasks with supervision cues.  SLP Short Term Goal 2 (Week 3): Pt will complete basic, familiar tasks with supervision cues for functional problem solving.   SLP Short Term Goal 3 (Week 3): Pt will sustain her attention to basic, familiar tasks for 30 minutes with supervision cues for redirection.    Weekly Progress Updates:   Pt has made excellent gains this reporting period and has met 3 out of 3 short term goals.  Pt is currently min assist for tasks due to mild cognitive deficits and higher level word finding impairment.  Pt has demonstrated improved functional problem solving. Pt education is ongoing.  Pt would continue to benefit from skilled ST while inpatient in order to maximize functional independence and reduce burden of care prior to discharge.  Anticipate that pt will need SNF placement at  discharge in addition to ST follow up at next level of care    Intensity: Minumum of 1-2 x/day, 30 to 90 minutes Frequency: 3 to 5 out of 7 days Duration/Length of Stay: 21-28 days  Treatment/Interventions: Cognitive remediation/compensation;Cueing hierarchy;Environmental controls;Functional tasks;Internal/external aids;Patient/family education   Daily Session  Skilled Therapeutic Interventions: Pt was seen for skilled ST targeting cognitive goals.  Upon arrival, pt reported that she needed to be changed and had been incontinent of urine.  She was able to use the call bell to convey needs to nursing with min assist verbal cues.  Once changed into dry brief and clean pants, therapist facilitated the session with a novel deductive reasoning puzzle to address problem solving goals.  Pt was independently 50% accurate within task which improved to 100% accuracy with min assist verbal cues for attention to details.  Pt was left in wheelchair with chair alarm set and call bell within reach.  Goals updated on this date to reflect current progress and plan of care.    General    Pain Pain Assessment Pain Scale: 0-10 Pain Score: 0-No pain  Therapy/Group: Individual Therapy  Mikaeel Petrow, Selinda Orion 06/11/2018, 11:16 AM

## 2018-06-11 NOTE — Progress Notes (Signed)
Occupational Therapy Session Note  Patient Details  Name: [name] [name] MRN: [id number] Date of Birth: 06-12-1986  Today's Date: 06/11/2018 OT Individual Time: 0800-0930 OT Individual Time Calculation (min): 90 min    Short Term Goals: Week 2:  OT Short Term Goal 1 (Week 2): Pt will complete squat pivot transfer to toilet/BSC with +1 assist OT Short Term Goal 2 (Week 2): Pt will stand to complete 1 grooming task at sink with max A standing balance in order to address functional standing balance/endurance OT Short Term Goal 3 (Week 2): Pt will initiate use of R UE during functional task with min cuing OT Short Term Goal 4 (Week 2): Pt will complete sit>stand from EOB with mod A +1 during dressing routine  Skilled Therapeutic Interventions/Progress Updates:    Pt seen ffor OT ADl bathing/dressing session.. Pt in supine upon arrival denying pain and agreeable to tx session. Pt desiring to shower.  She transferred to EOB with supervision using hospital bed functions with increased time for management of R LE. Used STEDY with +2 assist in order to assist with maintaining NWB through R LE while no knee immobilizer donned. She bathed seated on BSC in shower. Min hand over hand to use R UE to bathe L arm. With assist to place R LE into figure four position in order to wash LEs.  She transferred to EOB via STEDY. She dressed seated EOB, able to thread LEs with LEs placed into figure four. She stood from EOB with min A +1 to standard walker. Pt able to assist with pulling pants up with mod steadying assist.  Completed min A sliding board transfer EOB>w/c. Completed oral care and face washing standing at sink with min-mod steadying assist with blocking of R knee. Seated rest break provided btwn tasks. She self propelled w/c to therapy gym with supervision. Completed B UE ROM exercises focusing on simultaneous use, Able to complete chest press and bicep curl with lightweight plastic bat. Pt transferred  back to room at end of session, left seated in w/c with chair belt alarm on and all needs in reach.    Therapy Documentation Precautions:  Precautions Precautions: Fall Precaution Comments: right hemiplegia, pushes right Restrictions Weight Bearing Restrictions: No Pain:   No/denies pain   Therapy/Group: Individual Therapy  Carvin Almas L 06/11/2018, 6:14 AM

## 2018-06-11 NOTE — Patient Care Conference (Signed)
Inpatient RehabilitationTeam Conference and Plan of Care Update Date: 06/11/2018   Time: 9:50 AM    Patient Name: [name] [name]      Medical Record Number: [id number]  Date of Birth: [birth year] Sex: Female         Room/Bed: 4M03C/4M03C-01 Payor Info: Payor: MEDICAID [place] / Plan: MEDICAID [place] ACCESS / Product Type: *No Product type* /    Admitting Diagnosis: [place] CVA  Admit Date/Time:  05/27/2018  3:36 PM Admission Comments: No comment available   Primary Diagnosis:  SAH (subarachnoid hemorrhage) (HCC) Principal Problem: SAH (subarachnoid hemorrhage) ([place])  Patient Active Problem List   Diagnosis Date Noted  . Complex tear of lateral meniscus of right knee as current injury   . Primary osteoarthritis of right knee   . Knee pain, right   . 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 Leukopenia   . Acute deep vein thrombosis (DVT) of right peroneal vein   . Hypokalemia   . Nonverbal   . Essential hypertension   . Hyperglycemia   . SAH (subarachnoid hemorrhage) ([place])   . Acute cerebral infarction ([place])   . HCAP (healthcare-associated pneumonia)   . Benign essential HTN   . Acute blood loss anemia   . Right hemiplegia ([place])   . 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 Endotracheal tube present   . Brain aneurysm   . Subarachnoid hemorrhage ([place]) 05/09/2018  . Gastroesophageal reflux disease without esophagitis 10/09/2016  . BMI 31.0-31.9,adult 07/20/2016  . Obesity in pregnancy, antepartum 07/20/2016  . Tobacco abuse 06/01/2015  . Marijuana use 06/23/2014    Expected Discharge Date: Expected Discharge Date: 06/21/18  Team Members Present: Physician leading conference: Dr. 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  Improve mobility, BP, spasticity, knee pain  See above   Bowel/Bladder   incontient of bowel and bladder, LBM-06/08/18-given prn miralax  Progress towards becoming continent of bowel and bladder; constipation treated with miralax.  Assist with toileting needs prn   Swallow/Nutrition/ Hydration             ADL's   Min-mod A slide board transfers. Mod A LB dressing, supervision-min A UB bathing/dressing; total A toileting  Min A overall, may downgrade due to pt progress.  Functional transfers, ADL re-training, Neuro re-ed   Mobility   minA bed mobility and transfers, min/modA sit <>stand, gait modA x10' with +2 w/c follow  upgraded to S transfers, minA gait  R NMR, standing balance, transfer/gait training   Communication   continued improvements, Min A to supervision cues  Supervision  higher level word finding   Safety/Cognition/ Behavioral Observations  Min A to  supervision  Supervision  Problem solving, memory, attention   Pain   no c/o pain   remain pain free  Assess pain every shift and prn   Skin   No new skin issues  maintain skin integrity  Assess skin every shift and prn      *See Care Plan and progress notes for long and short-term goals.     Barriers to Discharge  Current Status/Progress Possible  Resolutions Date Resolved   Physician    Medical stability;Behavior;Decreased caregiver support;Lack of/limited family support;Incontinence     See above  Therapies, follow labs, Repeat vascular study, optimize BP meds, knee immobilizer for bpne/meniscal injuries      Nursing                  PT                    OT                  SLP                SW                Discharge Planning/Teaching Needs:  Continues to make good progress in her therapies. Will begin pursuing short  term NHP      Team Discussion:  Progressing toward her goals of min assist level. MD ordered knee immobilizer for pain issues and ordering dopplers tomorrow to re-check her DVT. BP issues better. Nursing to do timed tolieting. Using transfer board now due to knee issues. Ready for neuro-psych to see for coping showing some depression signs.   Revisions to Treatment Plan:  DC 1/3 to NH    Continued Need for Acute Rehabilitation Level of Care: The patient requires daily medical management by a physician with specialized training in physical medicine and rehabilitation for the following conditions: Daily direction of a multidisciplinary physical rehabilitation program to ensure safe treatment while eliciting the highest outcome that is of practical value to the patient.: Yes Daily medical management of patient stability for increased activity during participation in an intensive rehabilitation regime.: Yes Daily analysis of laboratory values and/or radiology reports with any subsequent need for medication adjustment of medical intervention for : Neurological problems;Blood pressure problems;Mood/behavior problems;Other;Post surgical problems   I attest that I was present, lead the team conference, and concur with the assessment and plan of the team.   Elease Hashimoto 06/11/2018, 10:20 AM

## 2018-06-11 NOTE — Plan of Care (Signed)
  Problem: RH BOWEL ELIMINATION Goal: RH STG MANAGE BOWEL WITH ASSISTANCE Description STG Manage Bowel with Assistance. max  Outcome: Progressing Flowsheets (Taken 06/11/2018 1239) STG: Pt will manage bowels with assistance: 3-Moderate assistance   Problem: RH BLADDER ELIMINATION Goal: RH STG MANAGE BLADDER WITH ASSISTANCE Description STG Manage Bladder With Assistance. Max  Outcome: Progressing Flowsheets (Taken 06/11/2018 1239) STG: Pt will manage bladder with assistance: 3-Moderate assistance   Problem: RH SAFETY Goal: RH STG ADHERE TO SAFETY PRECAUTIONS W/ASSISTANCE/DEVICE Description STG Adhere to Safety Precautions With Assistance/Device. max  Outcome: Progressing   Problem: RH COGNITION-NURSING Goal: RH STG USES MEMORY AIDS/STRATEGIES W/ASSIST TO PROBLEM SOLVE Description STG Uses Memory Aids/Strategies With Assistance to Problem Solve. Max  Outcome: Progressing   Problem: RH KNOWLEDGE DEFICIT Goal: RH STG INCREASE KNOWLEDGE OF HYPERTENSION Description Patient and family will be able to verbalize target blood pressure, how to take blood pressure, and medication for HTN  Outcome: Progressing

## 2018-06-11 NOTE — Progress Notes (Signed)
[place] PHYSICAL MEDICINE & REHABILITATION PROGRESS NOTE  Subjective/Complaints: Patient seen sitting up in bed this morning.  She states she slept well overnight.  She notes improvement in her right upper extremity.  ROS: Denies CP, shortness of breath, nausea, vomiting, diarrhea.  Objective: Vital Signs: Blood pressure 107/75, pulse (!) 52, temperature 98.6 F (37 C), resp. rate 18, height 5\' 10"  (1.778 m), weight 89 kg, SpO2 100 %, currently breastfeeding. No results found. Recent Labs    06/10/18 0535  WBC 3.5*  HGB 11.3*  HCT 36.1  PLT 185   Recent Labs    06/10/18 0535  NA 144  K 4.2  CL 107  CO2 26  GLUCOSE 91  BUN 6  CREATININE 0.62  CALCIUM 9.6    Physical Exam: BP 107/75 (BP Location: Left Arm)   Pulse (!) 52   Temp 98.6 F (37 C)   Resp 18   Ht 5\' 10"  (1.778 m)   Wt 89 kg   SpO2 100%   BMI 28.15 kg/m  Constitutional: No distress . Vital signs reviewed.  Obese. HENT: Normocephalic.  Atraumatic. Eyes: EOMI. No discharge. Cardiovascular: RRR.  No JVD. Respiratory: CTA bilaterally.  Normal effort. GI: BS +. Non-distended. Musc: No edema or tenderness in extremities. Neurological: She is alert Delayed processing, improving.  Right upper extremity: Approximately 0/5, 1/5 hand grip Right lower extremity: 0/5 proximal to distal Left upper extremity: 4/5 proximal distal, stable Left lower extremity: 4-/5 proximal distal, stable Skin: She is not diaphoretic. Crani incision c/d/i  Psychiatric: Improving affect    Assessment/Plan: 1. Functional deficits secondary to left ACA with SAH which require 3+ hours per day of interdisciplinary therapy in a comprehensive inpatient rehab setting.  Physiatrist is providing close team supervision and 24 hour management of active medical problems listed below.  Physiatrist and rehab team continue to assess barriers to discharge/monitor patient progress toward functional and medical goals  Care  Tool:  Bathing    Body parts bathed by patient: Chest, Abdomen, Right arm, Right upper leg, Left upper leg, Face, Front perineal area, Right lower leg, Left lower leg   Body parts bathed by helper: Left arm, Buttocks     Bathing assist Assist Level: Moderate Assistance - Patient 50 - 74%     Upper Body Dressing/Undressing Upper body dressing   What is the patient wearing?: Pull over shirt    Upper body assist Assist Level: Minimal Assistance - Patient > 75%    Lower Body Dressing/Undressing Lower body dressing      What is the patient wearing?: Incontinence brief, Pants     Lower body assist Assist for lower body dressing: Maximal Assistance - Patient 25 - 49%     Toileting Toileting Toileting Activity did not occur (Clothing management and hygiene only): Safety/medical concerns  Toileting assist Assist for toileting: Total Assistance - Patient < 25%     Transfers Chair/bed transfer  Transfers assist  Chair/bed transfer activity did not occur: N/A  Chair/bed transfer assist level: Minimal Assistance - Patient > 75%     Locomotion Ambulation   Ambulation assist      Assist level: 2 helpers Assistive device: Walker-rolling Max distance: 15   Walk 10 feet activity   Assist     Assist level: 2 helpers     Walk 50 feet activity   Assist Walk 50 feet with 2 turns activity did not occur: Safety/medical concerns         Walk 150 feet activity  Assist Walk 150 feet activity did not occur: Safety/medical concerns         Walk 10 feet on uneven surface  activity   Assist Walk 10 feet on uneven surfaces activity did not occur: Safety/medical concerns         Wheelchair     Assist Will patient use wheelchair at discharge?: (TBD)   Wheelchair activity did not occur: Safety/medical concerns  Wheelchair assist level: Supervision/Verbal cueing Max wheelchair distance: 150'    Wheelchair 50 feet with 2 turns  activity    Assist    Wheelchair 50 feet with 2 turns activity did not occur: Safety/medical concerns   Assist Level: Supervision/Verbal cueing   Wheelchair 150 feet activity     Assist Wheelchair 150 feet activity did not occur: Safety/medical concerns   Assist Level: Supervision/Verbal cueing      Medical Problem List and Plan: 1.  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  Continue CIR  2.  DVT Prophylaxis/Anticoagulation: Mechanical: Sequential compression devices, below knee Bilateral lower extremities Right peroneal and posterior tibial vein acute DVT-repeat Dopplers suggesting persistent DVT without propagation, will plan for repeat tomorrow 3. Pain Management: Tylenol prn.   Right knee pain-, MRA reviewed no significant source identified.  Appreciate vascular Recs, MRI reviewed showing Baker's cyst, lateral meniscal tear, and OA.     Voltaren gel    Knee immobilizer   Improved 4. Mood: Team to provide ego support.  LCSW to follow for evaluation and support.  5. Neuropsych: This patient is not fully capable of making decisions on her own behalf. 6. Skin/Wound Care: Monitor incision for healing.  7. Fluids/Electrolytes/Nutrition: Monitor I/Os. Offer supplements if intake poor.   BMP normal on 12/23 8. HTN: Monitor BP bid  Amlodipine decreased to 7.5 on 12/18, decreased to 5 on 12/20, decreased to 2.5 on 12/24   Vitals:   06/10/18 1933 06/11/18 0419  BP: 119/83 107/75  Pulse: 74 (!) 52  Resp: 18 18  Temp: 97.9 F (36.6 C) 98.6 F (37 C)  SpO2: 100% 100%  9. Moraxella HCAP: Has completed antibiotic regimen on 12/4 10. ABLA: Continue to monitor H/H serially.   Hemoglobin 11.3 on 12/23  Continue to monitor 11.  Impaired fasting glucose: Likely stress related.   HbA1c 4.3 on 12/10- normal 12.  HypoK - supplement KCL  Potassium 4.2 on 12/23  Continue to monitor 13.  Spastic  hemiplegia  Right lower extremity with increased hamstring tone  Tizanidine qhs DC'd on 12/19  Baclofen 5 3 times daily started on 12/19  Cont ROM   Educated regarding appropriate positioning  Improving 14.  Leukopenia  WBCs 3.5 on 12/23  Continue to monitor   LOS: 15 days A FACE TO FACE EVALUATION WAS PERFORMED  Jantzen Pilger Lorie Phenix 06/11/2018, 8:02 AM

## 2018-06-11 NOTE — Plan of Care (Signed)
  Problem: RH BOWEL ELIMINATION Goal: RH STG MANAGE BOWEL WITH ASSISTANCE Description STG Manage Bowel with Assistance. max  Outcome: Progressing   Problem: RH BLADDER ELIMINATION Goal: RH STG MANAGE BLADDER WITH ASSISTANCE Description STG Manage Bladder With Assistance. Max  Outcome: Progressing   Problem: RH SAFETY Goal: RH STG ADHERE TO SAFETY PRECAUTIONS W/ASSISTANCE/DEVICE Description STG Adhere to Safety Precautions With Assistance/Device. max  Outcome: Progressing   Problem: RH COGNITION-NURSING Goal: RH STG USES MEMORY AIDS/STRATEGIES W/ASSIST TO PROBLEM SOLVE Description STG Uses Memory Aids/Strategies With Assistance to Problem Solve. Max  Outcome: Progressing   Problem: RH KNOWLEDGE DEFICIT Goal: RH STG INCREASE KNOWLEDGE OF HYPERTENSION Description Patient and family will be able to verbalize target blood pressure, how to take blood pressure, and medication for HTN  Outcome: Progressing

## 2018-06-12 ENCOUNTER — Inpatient Hospital Stay (HOSPITAL_BASED_OUTPATIENT_CLINIC_OR_DEPARTMENT_OTHER): Payer: Medicaid Other

## 2018-06-12 DIAGNOSIS — Z86718 Personal history of other venous thrombosis and embolism: Secondary | ICD-10-CM

## 2018-06-12 NOTE — Plan of Care (Signed)
  Problem: RH BOWEL ELIMINATION Goal: RH STG MANAGE BOWEL WITH ASSISTANCE Description STG Manage Bowel with Assistance. max  Outcome: Progressing   Problem: RH BLADDER ELIMINATION Goal: RH STG MANAGE BLADDER WITH ASSISTANCE Description STG Manage Bladder With Assistance. Max  Outcome: Progressing   Problem: RH SAFETY Goal: RH STG ADHERE TO SAFETY PRECAUTIONS W/ASSISTANCE/DEVICE Description STG Adhere to Safety Precautions With Assistance/Device. max  Outcome: Progressing   Problem: RH COGNITION-NURSING Goal: RH STG USES MEMORY AIDS/STRATEGIES W/ASSIST TO PROBLEM SOLVE Description STG Uses Memory Aids/Strategies With Assistance to Problem Solve. Max  Outcome: Progressing   Problem: RH KNOWLEDGE DEFICIT Goal: RH STG INCREASE KNOWLEDGE OF HYPERTENSION Description Patient and family will be able to verbalize target blood pressure, how to take blood pressure, and medication for HTN  Outcome: Progressing

## 2018-06-12 NOTE — Progress Notes (Signed)
*  Preliminary Results* Right lower extremity venous duplex completed. Right lower extremity is positive for age indeterminate deep vein thrombosis involving the right posterior tibial and peroneal veins; no significant change since prior study 06/04/18.   06/12/2018 3:05 PM  Maudry Mayhew, MHA, RVT, RDCS, RDMS

## 2018-06-12 NOTE — Progress Notes (Signed)
[place] PHYSICAL MEDICINE & REHABILITATION PROGRESS NOTE  Subjective/Complaints: Patient seen sitting up in bed this morning watching TV.  She states she slept well overnight.  She states she is enjoying her day of rest.  ROS: Denies CP, shortness of breath, nausea, vomiting, diarrhea.  Objective: Vital Signs: Blood pressure 90/66, pulse 65, temperature 98.5 F (36.9 C), temperature source Oral, resp. rate 20, height 5\' 10"  (1.778 m), weight 89 kg, SpO2 94 %, currently breastfeeding. No results found. Recent Labs    06/10/18 0535  WBC 3.5*  HGB 11.3*  HCT 36.1  PLT 185   Recent Labs    06/10/18 0535  NA 144  K 4.2  CL 107  CO2 26  GLUCOSE 91  BUN 6  CREATININE 0.62  CALCIUM 9.6    Physical Exam: BP 90/66 (BP Location: Left Arm)   Pulse 65   Temp 98.5 F (36.9 C) (Oral)   Resp 20   Ht 5\' 10"  (1.778 m)   Wt 89 kg   SpO2 94%   BMI 28.15 kg/m  Constitutional: No distress . Vital signs reviewed.  Obese. HENT: Normocephalic.  Atraumatic. Eyes: EOMI. No discharge. Cardiovascular: RRR. No JVD. Respiratory: CTA bilaterally. Normal effort. GI: BS +. Non-distended. Musc: No edema or tenderness in extremities. Neurological: She is alert Delayed processing, improving.  Right upper extremity: Approximately 0/5, 1/5 hand grip, stable Right lower extremity: 0/5 proximal to distal, stable Left upper extremity: 4/5 proximal distal, stable Left lower extremity: 4-/5 proximal distal, stable Skin: She is not diaphoretic. Crani incision c/d/i  Psychiatric: Improving affect    Assessment/Plan: 1. Functional deficits secondary to left ACA with SAH which require 3+ hours per day of interdisciplinary therapy in a comprehensive inpatient rehab setting.  Physiatrist is providing close team supervision and 24 hour management of active medical problems listed below.  Physiatrist and rehab team continue to assess barriers to discharge/monitor patient progress toward functional  and medical goals  Care Tool:  Bathing    Body parts bathed by patient: Chest, Abdomen, Right arm, Right upper leg, Left upper leg, Face, Front perineal area, Right lower leg, Left lower leg   Body parts bathed by helper: Left arm, Buttocks     Bathing assist Assist Level: Minimal Assistance - Patient > 75%     Upper Body Dressing/Undressing Upper body dressing   What is the patient wearing?: Pull over shirt    Upper body assist Assist Level: Minimal Assistance - Patient > 75%    Lower Body Dressing/Undressing Lower body dressing      What is the patient wearing?: Incontinence brief, Pants     Lower body assist Assist for lower body dressing: 2 Helpers     Toileting Toileting Toileting Activity did not occur (Clothing management and hygiene only): Safety/medical concerns  Toileting assist Assist for toileting: Total Assistance - Patient < 25%     Transfers Chair/bed transfer  Transfers assist  Chair/bed transfer activity did not occur: N/A  Chair/bed transfer assist level: Minimal Assistance - Patient > 75%     Locomotion Ambulation   Ambulation assist      Assist level: 2 helpers Assistive device: Walker-rolling Max distance: 10   Walk 10 feet activity   Assist     Assist level: 2 helpers     Walk 50 feet activity   Assist Walk 50 feet with 2 turns activity did not occur: Safety/medical concerns         Walk 150 feet activity  Assist Walk 150 feet activity did not occur: Safety/medical concerns         Walk 10 feet on uneven surface  activity   Assist Walk 10 feet on uneven surfaces activity did not occur: Safety/medical concerns         Wheelchair     Assist Will patient use wheelchair at discharge?: (TBD)   Wheelchair activity did not occur: Safety/medical concerns  Wheelchair assist level: Supervision/Verbal cueing Max wheelchair distance: 150'    Wheelchair 50 feet with 2 turns activity    Assist     Wheelchair 50 feet with 2 turns activity did not occur: Safety/medical concerns   Assist Level: Supervision/Verbal cueing   Wheelchair 150 feet activity     Assist Wheelchair 150 feet activity did not occur: Safety/medical concerns   Assist Level: Supervision/Verbal cueing      Medical Problem List and Plan: 1.  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  Continue CIR  2.  DVT Prophylaxis/Anticoagulation: Mechanical: Sequential compression devices, below knee Bilateral lower extremities Right peroneal and posterior tibial vein acute DVT-repeat Dopplers suggesting persistent DVT without propagation, repeat dopplers ordered 3. Pain Management: Tylenol prn.   Right knee pain-, MRA reviewed no significant source identified.  Appreciate vascular Recs, MRI reviewed showing Baker's cyst, lateral meniscal tear, and OA.     Voltaren gel    Knee immobilizer   Improved 4. Mood: Team to provide ego support.  LCSW to follow for evaluation and support.  5. Neuropsych: This patient is not fully capable of making decisions on her own behalf. 6. Skin/Wound Care: Monitor incision for healing.  7. Fluids/Electrolytes/Nutrition: Monitor I/Os. Offer supplements if intake poor.   BMP normal on 12/23 8. HTN: Monitor BP bid  Amlodipine decreased to 7.5 on 12/18, decreased to 5 on 12/20, decreased to 2.5 on 12/24   Vitals:   06/11/18 2050 06/12/18 0531  BP: 103/78 90/66  Pulse: 64 65  Resp: 18 20  Temp: 97.8 F (36.6 C) 98.5 F (36.9 C)  SpO2: 100% 94%   Remains low, will consider d/cing 9. Moraxella HCAP: Has completed antibiotic regimen on 12/4 10. ABLA: Continue to monitor H/H serially.   Hemoglobin 11.3 on 12/23  Continue to monitor 11.  Impaired fasting glucose: Likely stress related.   HbA1c 4.3 on 12/10- normal 12.  HypoK - supplement KCL  Potassium 4.2 on 12/23  Continue to monitor 13.  Spastic  hemiplegia  Right lower extremity with increased hamstring tone  Tizanidine qhs DC'd on 12/19  Baclofen 5 3 times daily started on 12/19  Cont ROM   Educated regarding appropriate positioning  Improving 14.  Leukopenia  WBCs 3.5 on 12/23  Continue to monitor   LOS: 16 days A FACE TO FACE EVALUATION WAS PERFORMED  Krithi Bray Lorie Phenix 06/12/2018, 8:02 AM

## 2018-06-13 ENCOUNTER — Inpatient Hospital Stay (HOSPITAL_COMMUNITY): Payer: Medicaid Other | Admitting: Physical Therapy

## 2018-06-13 ENCOUNTER — Inpatient Hospital Stay (HOSPITAL_COMMUNITY): Payer: Medicaid Other | Admitting: Occupational Therapy

## 2018-06-13 ENCOUNTER — Inpatient Hospital Stay (HOSPITAL_COMMUNITY): Payer: Medicaid Other | Admitting: Speech Pathology

## 2018-06-13 NOTE — Progress Notes (Signed)
Physical Therapy Session Note  Patient Details  Name: [name] [name] MRN: [id number] Date of Birth: 1985-08-13  Today's Date: 06/13/2018 PT Individual Time: 1330-1425 PT Individual Time Calculation (min): 55 min   Short Term Goals: Week 3:  PT Short Term Goal 1 (Week 3): Pt will perform slideboard transfers consistent S PT Short Term Goal 2 (Week 3): Pt will perform bed mobility min guard PT Short Term Goal 3 (Week 3): Pt will ambulate x25' modA +1  Skilled Therapeutic Interventions/Progress Updates: Pt received seated in w/c, mother and sister present to observe first therapy session. W/c propulsion S throughout unit with L hemi technique and min cues for safety. Slideboard transfer to/frm flat bed in ADL apartment with minA. Sit <>supine minA for RLE management. Gait x10' with standard walker and modA for RLE progression and stance control. Squat pivot mat table<>w/c with minA to R, min guard to L, cues for partial stand to clear hips over support surface. Gait x3 trials 25' each at [employer] rail with modA; facilitation for R hip/knee extension in stance. LLE toe taps to 3" step for forced use and weight bearing RLE; mod/maxA for stance control, cues for hip extension. Squat pivot to R to bed with minA. Sit >supine minA for RLE management. Cues throughout session to utilize RUE functionally for locking/unlocking brakes, etc; educated the pt's mother and sister regarding learned non use and goal of forced use to improve function of R hand. Remained in bed, four bedrails per pt preference, alarm intact and all needs in reach.     Therapy Documentation Precautions:  Precautions Precautions: Fall Precaution Comments: right hemiplegia, pushes right Restrictions Weight Bearing Restrictions: No    Therapy/Group: Individual Therapy  Corliss Skains 06/13/2018, 2:27 PM

## 2018-06-13 NOTE — Plan of Care (Signed)
  Problem: RH BOWEL ELIMINATION Goal: RH STG MANAGE BOWEL WITH ASSISTANCE Description STG Manage Bowel with Assistance. max  Outcome: Progressing   Problem: RH BLADDER ELIMINATION Goal: RH STG MANAGE BLADDER WITH ASSISTANCE Description STG Manage Bladder With Assistance. Max  Outcome: Progressing   Problem: RH SAFETY Goal: RH STG ADHERE TO SAFETY PRECAUTIONS W/ASSISTANCE/DEVICE Description STG Adhere to Safety Precautions With Assistance/Device. max  Outcome: Progressing   Problem: RH COGNITION-NURSING Goal: RH STG USES MEMORY AIDS/STRATEGIES W/ASSIST TO PROBLEM SOLVE Description STG Uses Memory Aids/Strategies With Assistance to Problem Solve. Max  Outcome: Progressing   Problem: RH KNOWLEDGE DEFICIT Goal: RH STG INCREASE KNOWLEDGE OF HYPERTENSION Description Patient and family will be able to verbalize target blood pressure, how to take blood pressure, and medication for HTN  Outcome: Progressing

## 2018-06-13 NOTE — Progress Notes (Signed)
Speech Language Pathology Daily Session Note  Patient Details  Name: [name] [name] MRN: [id number] Date of Birth: 10-Dec-1985  Today's Date: 06/13/2018 SLP Individual Time: 0805-0900 SLP Individual Time Calculation (min): 55 min  Short Term Goals: Week 3: SLP Short Term Goal 1 (Week 3): Pt will use compensatory strategies for mildly complex word finding during functional tasks with supervision cues.  SLP Short Term Goal 2 (Week 3): Pt will complete basic, familiar tasks with supervision cues for functional problem solving.   SLP Short Term Goal 3 (Week 3): Pt will sustain her attention to basic, familiar tasks for 30 minutes with supervision cues for redirection.    Skilled Therapeutic Interventions:  Pt was seen for skilled ST targeting cognitive-linguistic goals.  Pt was in bed upon arrival, awake, alert, and agreeable to participating in [place].  Pt wanted to get dressed prior to leaving the room for therapy and was able to obtain necessary items with set up assist and dress her upper body with min assist for everything but clasping her bra (total assist).  Mod-max assist needed for lower body dressing.  Pt demonstrated appropriate safety awareness when dressing while seated at edge of bed and when planning for slide board transfer to wheelchair.  SLP facilitated the session with a novel card game targeting memory and use of word finding strategies.  Pt was able generate and recall word-picture associations with mod I.  She needed supervision cues for higher level word finding within task and could recall specific category members named for 100% accuracy with supervision.  Pt was returned to room and left in chair with chair alarm set and call bell within reach.    Pain Pain Assessment Pain Scale: 0-10 Pain Score: 0-No pain  Therapy/Group: Individual Therapy  Jalaya Sarver, Selinda Orion 06/13/2018, 9:02 AM

## 2018-06-13 NOTE — Progress Notes (Signed)
Occupational Therapy Weekly Progress Note  Patient Details  Name: [name] [name] MRN: [id number] Date of Birth: [birth year]  Beginning of progress report period: June 04, 2018 End of progress report period: June 13, 2018  Today's Date: 06/13/2018 OT Individual Time: 1050-1200 OT Individual Time Calculation (min): 70 min    Patient has met 2 of 4 short term goals.  Pt cont to make steady progress towards OT goals. Transfer method modified during reporting period as pt now with R knee immobilizer and therefore squat pivot transfers are not feasible. She is using a sliding board with min  Patient continues to demonstrate the following deficits:abnormal posture, cognitive deficits, flaccid hemiplegia and hemiparesis, hemiplegia affecting dominant side and muscle weakness (generalized) and therefore will continue to benefit from skilled OT intervention to enhance overall performance with BADL and Reduce care partner burden.  Patient progressing toward long term goals..  Plan of care revisions: Toileting task downgraded to max A. UE functional use goal upgraded to supervision stabilizer level.  OT Short Term Goals Week 2:  OT Short Term Goal 1 (Week 2): Pt will complete squat pivot transfer to toilet/BSC with +1 assist OT Short Term Goal 1 - Progress (Week 2): Progressing toward goal OT Short Term Goal 2 (Week 2): Pt will stand to complete 1 grooming task at sink with max A standing balance in order to address functional standing balance/endurance OT Short Term Goal 2 - Progress (Week 2): Met OT Short Term Goal 3 (Week 2): Pt will initiate use of R UE during functional task with min cuing OT Short Term Goal 3 - Progress (Week 2): Not met OT Short Term Goal 4 (Week 2): Pt will complete sit>stand from EOB with mod A +1 during dressing routine OT Short Term Goal 4 - Progress (Week 2): Met Week 3:  OT Short Term Goal 1 (Week 3): Pt will complete LB clothing management sit>stand with +1  assist in order to reduce caregiver burden OT Short Term Goal 2 (Week 3): Pt will initiate use of R UE during functional task with min cuing OT Short Term Goal 3 (Week 3): Pt will complete slide board transfer to drop arm BSC with min A  Skilled Therapeutic Interventions/Progress Updates:    Pt seen for OT session focusing on functional transfers and R neuro re-ed. Pt sitting up in w/c upon arrival, agreeable to tx session and denying pain. She declined the need for bathing/dressing this morning.  Addressed toilet transfer goal. Completed sliding board transfer w/c>drop arm BSC. Min A transferring onto toilet towards L, required mod A to transfer back to w/c transferring to weaker R side. Assist required for management of R LE during transfer. While on BSC, pt able to complete lateral leans with CGA in order to pull pants down. Pt unable to void on BSC. Stood from [facility] to [place] with min A to power into standing. Required total A for positioning of R LE, min-mod A for standing balance while +2 present to assist with clothing management.  Pt returned to w/c and self propelled w/c throughout unit mod I using hemi technique. Addressed R UE functional use via table top task. Pt required to reach out and obtain beanbag with R UE and sort beanbags by color. Able to complete with increased time 2/2 apraxia,  However, demonstrates moderatly functional reach and gross grasp. Also used finger isolation to assist with moving beanbags. Pt unable to motor plan tossing of bean bags despite hand over hand assist. Worked on  crossing midline to place bean bags, increased time for motor planning and rest breaks 2/2 fatigue. Pt returned to room in same manner as described above. Left seated up in w/c with all needs in reach, set-up with meal tray.   Therapy Documentation Precautions:  Precautions Precautions: Fall Precaution Comments: right hemiplegia, pushes right Restrictions Weight Bearing Restrictions: No Pain:    No/denies pain   Therapy/Group: Individual Therapy  Remington Skalsky L 06/13/2018, 7:06 AM

## 2018-06-13 NOTE — Progress Notes (Signed)
[place] PHYSICAL MEDICINE & REHABILITATION PROGRESS NOTE  Subjective/Complaints: Patient seen sitting up in bed.  He slept well overnight.  ROS: Denies CP, shortness of breath, nausea, vomiting, diarrhea.  Objective: Vital Signs: Blood pressure 117/76, pulse 68, temperature 98.2 F (36.8 C), temperature source Oral, resp. rate 18, height 5\' 10"  (1.778 m), weight 60.3 kg, SpO2 100 %, currently breastfeeding. Vas [name] Lower Extremity Venous (dvt)  Result Date: 06/12/2018  Lower Venous Study Indications: Follow up right posterior tibial and peroneal vein DVT 06/04/18.  Performing Technologist: Maudry Mayhew MHA, RDMS, RVT, RDCS  Examination Guidelines: A complete evaluation includes B-mode imaging, spectral Doppler, color Doppler, and power Doppler as needed of all accessible portions of each vessel. Bilateral testing is considered an integral part of a complete examination. Limited examinations for reoccurring indications may be performed as noted.  Right Venous Findings: +---------+---------------+---------+-----------+----------+-----------------+          CompressibilityPhasicitySpontaneityPropertiesSummary           +---------+---------------+---------+-----------+----------+-----------------+ CFV      Full           Yes      Yes                                    +---------+---------------+---------+-----------+----------+-----------------+ SFJ      Full                                                           +---------+---------------+---------+-----------+----------+-----------------+ FV Prox  Full                                                           +---------+---------------+---------+-----------+----------+-----------------+ FV Mid   Full                                                           +---------+---------------+---------+-----------+----------+-----------------+ FV DistalFull                                                            +---------+---------------+---------+-----------+----------+-----------------+ PFV      Full                                                           +---------+---------------+---------+-----------+----------+-----------------+ POP      Full           Yes      Yes                                    +---------+---------------+---------+-----------+----------+-----------------+  PTV      None                    No                   Age Indeterminate +---------+---------------+---------+-----------+----------+-----------------+ PERO     None                    No                   Age Indeterminate +---------+---------------+---------+-----------+----------+-----------------+  Left Venous Findings: +---+---------------+---------+-----------+----------+-------+    CompressibilityPhasicitySpontaneityPropertiesSummary +---+---------------+---------+-----------+----------+-------+ CFVFull           Yes      Yes                          +---+---------------+---------+-----------+----------+-------+    Summary: Right: Findings consistent with age indeterminate deep vein thrombosis involving the right posterior tibial vein, and right peroneal vein. Findings appear essentially unchanged compared to previous examination. Left: No evidence of common femoral vein obstruction.  *See table(s) above for measurements and observations.    Preliminary    No results for input(s): WBC, HGB, HCT, PLT in the last 72 hours. No results for input(s): NA, K, CL, CO2, GLUCOSE, BUN, CREATININE, CALCIUM in the last 72 hours.  Physical Exam: BP 117/76 (BP Location: Left Arm)   Pulse 68   Temp 98.2 F (36.8 C) (Oral)   Resp 18   Ht 5\' 10"  (1.778 m)   Wt 60.3 kg   SpO2 100%   BMI 19.07 kg/m  Constitutional: No distress . Vital signs reviewed.  Obese. HENT: Normocephalic.  Atraumatic. Eyes: EOMI. No discharge. Cardiovascular: RRR.  No JVD. Respiratory: CTA bilaterally.  Normal  effort. GI: BS +. Non-distended. Musc: No edema or tenderness in extremities. Neurological: She is alert Delayed processing, improving.  Right upper extremity: Proximally 1/5, 2/5 hand grip Right lower extremity: 0/5 proximal to distal, unchanged Left upper extremity: 4/5 proximal distal, stable Left lower extremity: 4-/5 proximal distal, stable Skin: She is not diaphoretic. Crani incision c/d/i  Psychiatric: Improving affect    Assessment/Plan: 1. Functional deficits secondary to left ACA with SAH which require 3+ hours per day of interdisciplinary therapy in a comprehensive inpatient rehab setting.  Physiatrist is providing close team supervision and 24 hour management of active medical problems listed below.  Physiatrist and rehab team continue to assess barriers to discharge/monitor patient progress toward functional and medical goals  Care Tool:  Bathing    Body parts bathed by patient: Chest, Abdomen, Right arm, Right upper leg, Left upper leg, Face, Front perineal area, Right lower leg, Left lower leg   Body parts bathed by helper: Left arm, Buttocks     Bathing assist Assist Level: Minimal Assistance - Patient > 75%     Upper Body Dressing/Undressing Upper body dressing   What is the patient wearing?: Pull over shirt    Upper body assist Assist Level: Minimal Assistance - Patient > 75%    Lower Body Dressing/Undressing Lower body dressing      What is the patient wearing?: Incontinence brief, Pants     Lower body assist Assist for lower body dressing: 2 Helpers     Toileting Toileting Toileting Activity did not occur (Clothing management and hygiene only): Safety/medical concerns  Toileting assist Assist for toileting: Total Assistance - Patient < 25%     Transfers Chair/bed transfer  Transfers assist  Chair/bed transfer activity did not occur: N/A  Chair/bed transfer assist level: Minimal Assistance - Patient > 75%      Locomotion Ambulation   Ambulation assist      Assist level: 2 helpers Assistive device: Walker-rolling Max distance: 10   Walk 10 feet activity   Assist     Assist level: 2 helpers     Walk 50 feet activity   Assist Walk 50 feet with 2 turns activity did not occur: Safety/medical concerns         Walk 150 feet activity   Assist Walk 150 feet activity did not occur: Safety/medical concerns         Walk 10 feet on uneven surface  activity   Assist Walk 10 feet on uneven surfaces activity did not occur: Safety/medical concerns         Wheelchair     Assist Will patient use wheelchair at discharge?: (TBD)   Wheelchair activity did not occur: Safety/medical concerns  Wheelchair assist level: Supervision/Verbal cueing Max wheelchair distance: 150'    Wheelchair 50 feet with 2 turns activity    Assist    Wheelchair 50 feet with 2 turns activity did not occur: Safety/medical concerns   Assist Level: Supervision/Verbal cueing   Wheelchair 150 feet activity     Assist Wheelchair 150 feet activity did not occur: Safety/medical concerns   Assist Level: Supervision/Verbal cueing      Medical Problem List and Plan: 1.  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  Continue CIR  2.  DVT Prophylaxis/Anticoagulation: Mechanical: Sequential compression devices, below knee Bilateral lower extremities Right peroneal and posterior tibial vein acute DVT-repeat Dopplers suggesting persistent DVT without propagation, repeat dopplers again showing persistent DVT 3. Pain Management: Tylenol prn.   Right knee pain-, MRA reviewed no significant source identified.  Appreciate vascular Recs, MRI reviewed showing Baker's cyst, lateral meniscal tear, and OA.     Voltaren gel    Knee immobilizer   Improved 4. Mood: Team to provide ego support.  LCSW to follow for evaluation and  support.  5. Neuropsych: This patient is not fully capable of making decisions on her own behalf. 6. Skin/Wound Care: Monitor incision for healing.  7. Fluids/Electrolytes/Nutrition: Monitor I/Os. Offer supplements if intake poor.   BMP normal on 12/23 8. HTN: Monitor BP bid  Amlodipine decreased to 7.5 on 12/18, decreased to 5 on 12/20, decreased to 2.5 on 12/24   Vitals:   06/12/18 1920 06/13/18 0407  BP: 104/66 117/76  Pulse: 71 68  Resp: 18 18  Temp: 98.4 F (36.9 C) 98.2 F (36.8 C)  SpO2: 96% 100%   Relatively controlled on 12/26 9. Moraxella HCAP: Has completed antibiotic regimen on 12/4 10. ABLA: Continue to monitor H/H serially.   Hemoglobin 11.3 on 12/23  Continue to monitor 11.  Impaired fasting glucose: Likely stress related.   HbA1c 4.3 on 12/10- normal 12.  HypoK - supplement KCL  Potassium 4.2 on 12/23  Continue to monitor 13.  Spastic hemiplegia  Right lower extremity with increased hamstring tone  Tizanidine qhs DC'd on 12/19  Baclofen 5 3 times daily started on 12/19  Cont ROM   Educated regarding appropriate positioning  Improving 14.  Leukopenia  WBCs 3.5 on 12/23  Continue to monitor   LOS: 17 days A FACE TO FACE EVALUATION WAS PERFORMED  Ankit Lorie Phenix 06/13/2018, 8:55 AM

## 2018-06-14 ENCOUNTER — Inpatient Hospital Stay (HOSPITAL_COMMUNITY): Payer: Medicaid Other | Admitting: Physical Therapy

## 2018-06-14 ENCOUNTER — Inpatient Hospital Stay (HOSPITAL_COMMUNITY): Payer: Medicaid Other | Admitting: Occupational Therapy

## 2018-06-14 ENCOUNTER — Inpatient Hospital Stay (HOSPITAL_COMMUNITY): Payer: Medicaid Other | Admitting: Speech Pathology

## 2018-06-14 NOTE — Progress Notes (Addendum)
Occupational Therapy Session Note  Patient Details  Name: [name] [name] MRN: [id number] Date of Birth: 1986-02-07  Today's Date: 06/14/2018 OT Individual Time: 1040-1200 and 1400-1450 OT Individual Time Calculation (min): 80 min and 50 min   Short Term Goals: Week 3:  OT Short Term Goal 1 (Week 3): Pt will complete LB clothing management sit>stand with +1 assist in order to reduce caregiver burden OT Short Term Goal 2 (Week 3): Pt will initiate use of R UE during functional task with min cuing OT Short Term Goal 3 (Week 3): Pt will complete slide board transfer to drop arm BSC with min A  Skilled Therapeutic Interventions/Progress Updates:    Session Two: Pt seen for OT ADL bathing/dressing session. Pt sitting up in bed upon arrival, deniyng pain and agreeable to tx session. With min cuing, pt able to locate request written to OT on communication board that she desired to shower today.  She transferred to sitting EOB with supervision using hospital bed functions. STEDY used for transfer in/out of shower with close supervision- CGA to come into standing in STEDY. She bathed seated on BSC. Mod cuing for functional use of R UE during ADL tasks. With increased time and focus, pt able to manipulate shower controls with R UE and washed L UE using R UE without assist from therapist- an improvement from previous sessions. Bathing completed at overall min A.  She transferred into w/c to dress, UB dressing completed with assist to fasten bra. With min A to obtain figure four sitting position, pt able to thread B LEs into pants. She stood at sink with overall min A, blocking of R knee and one UE support, able to pull pants up with mod steadying assist. Grooming tasks completed from w/c level at sink, pt able to use B UEs to assist with set-up of task. She self propelled w/c throughout unit with supervision, min cuing for awareness to R UE.  Completed siding board transfer w/c>EOM with CGA going to R and  L. Completed dynamic reaching task in standing position, pt required to obtain item on L and place on R to promote weight shift onto R LE. Min A standing balance with blocking of R knee. Completed x2 trials with seated rest break provided btwn trials.  Pt returned to room in same manner as described above. Left set-up with lunch transfer and all needs in reach, safety belt donned.   Session Two: Pt seen for OT session focusing on functional standing balance/tolerance and R UE use. Pt in supine upon arrival with NT present about to assist pt with bed level toileting following incontinent void. With  Encouragement from therapist, pt willing to complete brief change via sit>stand from EOB. Transferred to EOB with supervision using hospital bed functions. With min A from therapist for standing balance, NT assist with hygiene and donning new brief. Seated rest break required before standing again to pull pants up. Pt able to assist with clothing management. Pt required total A to block R knee this session to prevent buckling.  She completed sliding board transfer to w/c with CGA, able to place board independently this session. Self- propelled w/c to [facility] gym with supervision and cuing for R hand placement. Completed Dyn[employer]ision activity from w/c level focusing on forced use and ROM of R UE. Pt able to complete ~100 degrees shoulder flexion. Demonstrates poor muscle isolation abilities needed to extend elbow for reach. Min A provided for reach. Addressed w/c mobility attempting to incorporate R UE.  Theraband placed around rim for increased grip. Hand over hand assist for motor planning. Pt unable to coordinate movements and provide enough strength through R UE at this time to use functionally to assist with propulsion. She returned to room and requesting return to supine. Min A sliding board transfer to bed. Pt left in supine with all needs in reach and bed alarm on.   Therapy Documentation Precautions:   Precautions Precautions: Fall Precaution Comments: right hemiplegia, pushes right Restrictions Weight Bearing Restrictions: No Pain:   No/denies pain   Therapy/Group: Individual Therapy  Brazen Domangue L 06/14/2018, 6:51 AM

## 2018-06-14 NOTE — Plan of Care (Signed)
  Problem: RH BOWEL ELIMINATION Goal: RH STG MANAGE BOWEL WITH ASSISTANCE Description STG Manage Bowel with Assistance. max  Outcome: Progressing   Problem: RH BLADDER ELIMINATION Goal: RH STG MANAGE BLADDER WITH ASSISTANCE Description STG Manage Bladder With Assistance. Max  Outcome: Progressing   Problem: RH SAFETY Goal: RH STG ADHERE TO SAFETY PRECAUTIONS W/ASSISTANCE/DEVICE Description STG Adhere to Safety Precautions With Assistance/Device. max  Outcome: Progressing   Problem: RH COGNITION-NURSING Goal: RH STG USES MEMORY AIDS/STRATEGIES W/ASSIST TO PROBLEM SOLVE Description STG Uses Memory Aids/Strategies With Assistance to Problem Solve. Max  Outcome: Progressing   Problem: RH KNOWLEDGE DEFICIT Goal: RH STG INCREASE KNOWLEDGE OF HYPERTENSION Description Patient and family will be able to verbalize target blood pressure, how to take blood pressure, and medication for HTN  Outcome: Progressing

## 2018-06-14 NOTE — Progress Notes (Signed)
Speech Language Pathology Daily Session Note  Patient Details  Name: [name] [name] MRN: [id number] Date of Birth: 02-Sep-1985  Today's Date: 06/14/2018 SLP Individual Time: 2446-2863 SLP Individual Time Calculation (min): 27 min  Short Term Goals: Week 3: SLP Short Term Goal 1 (Week 3): Pt will use compensatory strategies for mildly complex word finding during functional tasks with supervision cues.  SLP Short Term Goal 2 (Week 3): Pt will complete basic, familiar tasks with supervision cues for functional problem solving.   SLP Short Term Goal 3 (Week 3): Pt will sustain her attention to basic, familiar tasks for 30 minutes with supervision cues for redirection.    Skilled Therapeutic Interventions:  Pt was seen for skilled ST targeting cognitive goals.  SLP facilitated the session with novel deductive reasoning puzzles to address problem solving goals.  Pt was able to complete tasks for 100% accuracy with supervision cues for task organization.  Pt was left in bed with bed alarm set and call bell within reach.  Continue per current plan of care.   Pain Pain Assessment Pain Scale: 0-10 Pain Score: 0-No pain  Therapy/Group: Individual Therapy  Juno Alers, Elmyra Ricks L 06/14/2018, 9:00 AM

## 2018-06-14 NOTE — Progress Notes (Signed)
[place] PHYSICAL MEDICINE & REHABILITATION PROGRESS NOTE  Subjective/Complaints: Patient seen sitting up in bed this morning.  She states she slept well overnight.  She denies complaints.  ROS: Denies CP, shortness of breath, nausea, vomiting, diarrhea.  Objective: Vital Signs: Blood pressure 96/68, pulse 61, temperature 98.1 F (36.7 C), temperature source Oral, resp. rate 18, height 5\' 10"  (1.778 m), weight 59.5 kg, SpO2 100 %, currently breastfeeding. Vas [name] Lower Extremity Venous (dvt)  Result Date: 06/13/2018  Lower Venous Study Indications: Follow up right posterior tibial and peroneal vein DVT 06/04/18.  Performing Technologist: Maudry Mayhew MHA, RDMS, RVT, RDCS  Examination Guidelines: A complete evaluation includes B-mode imaging, spectral Doppler, color Doppler, and power Doppler as needed of all accessible portions of each vessel. Bilateral testing is considered an integral part of a complete examination. Limited examinations for reoccurring indications may be performed as noted.  Right Venous Findings: +---------+---------------+---------+-----------+----------+-----------------+          CompressibilityPhasicitySpontaneityPropertiesSummary           +---------+---------------+---------+-----------+----------+-----------------+ CFV      Full           Yes      Yes                                    +---------+---------------+---------+-----------+----------+-----------------+ SFJ      Full                                                           +---------+---------------+---------+-----------+----------+-----------------+ FV Prox  Full                                                           +---------+---------------+---------+-----------+----------+-----------------+ FV Mid   Full                                                           +---------+---------------+---------+-----------+----------+-----------------+ FV DistalFull                                                            +---------+---------------+---------+-----------+----------+-----------------+ PFV      Full                                                           +---------+---------------+---------+-----------+----------+-----------------+ POP      Full           Yes      Yes                                    +---------+---------------+---------+-----------+----------+-----------------+  PTV      None                    No                   Age Indeterminate +---------+---------------+---------+-----------+----------+-----------------+ PERO     None                    No                   Age Indeterminate +---------+---------------+---------+-----------+----------+-----------------+  Left Venous Findings: +---+---------------+---------+-----------+----------+-------+    CompressibilityPhasicitySpontaneityPropertiesSummary +---+---------------+---------+-----------+----------+-------+ CFVFull           Yes      Yes                          +---+---------------+---------+-----------+----------+-------+    Summary: Right: Findings consistent with age indeterminate deep vein thrombosis involving the right posterior tibial vein, and right peroneal vein. Findings appear essentially unchanged compared to previous examination. Left: No evidence of common femoral vein obstruction.  *See table(s) above for measurements and observations. Electronically signed by Monica Martinez MD on 06/13/2018 at 2:03:09 PM.    Final    No results for input(s): WBC, HGB, HCT, PLT in the last 72 hours. No results for input(s): NA, K, CL, CO2, GLUCOSE, BUN, CREATININE, CALCIUM in the last 72 hours.  Physical Exam: BP 96/68 (BP Location: Left Arm)   Pulse 61   Temp 98.1 F (36.7 C) (Oral)   Resp 18   Ht 5\' 10"  (1.778 m)   Wt 59.5 kg   SpO2 100%   BMI 18.82 kg/m  Constitutional: No distress . Vital signs reviewed.  Obese. HENT: Normocephalic.   Atraumatic. Eyes: EOMI. No discharge. Cardiovascular: RRR.  No JVD. Respiratory: CTA bilaterally.  Normal effort. GI: BS +. Non-distended. Musc: No edema or tenderness in extremities. Neurological: She is alert Delayed processing, improving.  Right upper extremity: Proximally 1/5, 2/5 hand grip, stable Right lower extremity: 0/5 proximal to distal, stable Left upper extremity: 4/5 proximal distal, stable Left lower extremity: 4-/5 proximal distal, stable Skin: She is not diaphoretic. Crani incision c/d/i  Psychiatric: Improving affect    Assessment/Plan: 1. Functional deficits secondary to left ACA with SAH which require 3+ hours per day of interdisciplinary therapy in a comprehensive inpatient rehab setting.  Physiatrist is providing close team supervision and 24 hour management of active medical problems listed below.  Physiatrist and rehab team continue to assess barriers to discharge/monitor patient progress toward functional and medical goals  Care Tool:  Bathing    Body parts bathed by patient: Chest, Abdomen, Right arm, Right upper leg, Left upper leg, Face, Front perineal area, Right lower leg, Left lower leg   Body parts bathed by helper: Left arm, Buttocks     Bathing assist Assist Level: Minimal Assistance - Patient > 75%     Upper Body Dressing/Undressing Upper body dressing   What is the patient wearing?: Pull over shirt, Bra    Upper body assist Assist Level: Moderate Assistance - Patient 50 - 74%    Lower Body Dressing/Undressing Lower body dressing      What is the patient wearing?: Incontinence brief, Pants     Lower body assist Assist for lower body dressing: Maximal Assistance - Patient 25 - 49%     Toileting Toileting Toileting Activity did not occur (Clothing management and hygiene only): Safety/medical concerns  Toileting assist  Assist for toileting: Total Assistance - Patient < 25%     Transfers Chair/bed transfer  Transfers assist   Chair/bed transfer activity did not occur: N/A  Chair/bed transfer assist level: Minimal Assistance - Patient > 75%     Locomotion Ambulation   Ambulation assist      Assist level: Moderate Assistance - Patient 50 - 74% Assistive device: (hall rail) Max distance: 25   Walk 10 feet activity   Assist     Assist level: Moderate Assistance - Patient - 50 - 74%     Walk 50 feet activity   Assist Walk 50 feet with 2 turns activity did not occur: Safety/medical concerns         Walk 150 feet activity   Assist Walk 150 feet activity did not occur: Safety/medical concerns         Walk 10 feet on uneven surface  activity   Assist Walk 10 feet on uneven surfaces activity did not occur: Safety/medical concerns         Wheelchair     Assist Will patient use wheelchair at discharge?: (TBD)   Wheelchair activity did not occur: Safety/medical concerns  Wheelchair assist level: Supervision/Verbal cueing Max wheelchair distance: 150'    Wheelchair 50 feet with 2 turns activity    Assist    Wheelchair 50 feet with 2 turns activity did not occur: Safety/medical concerns   Assist Level: Supervision/Verbal cueing   Wheelchair 150 feet activity     Assist Wheelchair 150 feet activity did not occur: Safety/medical concerns   Assist Level: Supervision/Verbal cueing      Medical Problem List and Plan: 1.  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  Continue CIR  2.  DVT Prophylaxis/Anticoagulation: Mechanical: Sequential compression devices, below knee Bilateral lower extremities Right peroneal and posterior tibial vein acute DVT-repeat Dopplers suggesting persistent DVT without propagation, repeat dopplers again showing persistent DVT 3. Pain Management: Tylenol prn.   Right knee pain-, MRA reviewed no significant source identified.  Appreciate vascular Recs, MRI reviewed  showing Baker's cyst, lateral meniscal tear, and OA.     Voltaren gel    Knee immobilizer   Improved 4. Mood: Team to provide ego support.  LCSW to follow for evaluation and support.  5. Neuropsych: This patient is not fully capable of making decisions on her own behalf. 6. Skin/Wound Care: Monitor incision for healing.  7. Fluids/Electrolytes/Nutrition: Monitor I/Os. Offer supplements if intake poor.   BMP normal on 12/23 8. HTN: Monitor BP bid  Amlodipine decreased to 7.5 on 12/18, decreased to 5 on 12/20, decreased to 2.5 on 12/24   Vitals:   06/13/18 2003 06/14/18 0456  BP: 127/88 96/68  Pulse: 66 61  Resp: 17 18  Temp: 98.3 F (36.8 C) 98.1 F (36.7 C)  SpO2: 100% 100%   Labile on 12/27, monitor for trend 9. Moraxella HCAP: Has completed antibiotic regimen on 12/4 10. ABLA: Continue to monitor H/H serially.   Hemoglobin 11.3 on 12/23  Continue to monitor 11.  Impaired fasting glucose: Likely stress related.   HbA1c 4.3 on 12/10- normal 12.  HypoK - supplement KCL  Potassium 4.2 on 12/23  Labs ordered for Monday  Continue to monitor 13.  Spastic hemiplegia  Right lower extremity with increased hamstring tone  Tizanidine qhs DC'd on 12/19  Baclofen 5 3 times daily started on 12/19  Cont ROM   Educated regarding appropriate positioning  Improving 14.  Leukopenia  WBCs 3.5 on 12/23  Labs ordered for Monday  Continue to monitor   LOS: 18 days A FACE TO FACE EVALUATION WAS PERFORMED  Eliud Polo Lorie Phenix 06/14/2018, 8:51 AM

## 2018-06-14 NOTE — Progress Notes (Signed)
Physical Therapy Session Note  Patient Details  Name: [name] [name] MRN: [id number] Date of Birth: [birth year]  Today's Date: 06/14/2018 PT Individual Time: 1305-1405 PT Individual Time Calculation (min): 60 min   Short Term Goals: Week 3:  PT Short Term Goal 1 (Week 3): Pt will perform slideboard transfers consistent S PT Short Term Goal 2 (Week 3): Pt will perform bed mobility min guard PT Short Term Goal 3 (Week 3): Pt will ambulate x25' modA +1  Skilled Therapeutic Interventions/Progress Updates:   Pt received sitting in WC and agreeable to PT. WC mobility instructed by PT x 122f to rehab gym and supervision assist. Squat pivot transfer to mat table with min assist and moderate cues for safety awareness and RLE management.  Sit<>supine with min assist from PT to control the RLE. PT repositioned KI in supine and increased tightness to maintain knee extension. [facility] to prevent knee collapse on the R.   Gait training at rail in hall x 135fwith mod assist and Ace wrap to force ankle DF with limb advancement. Pt reports mild pain in the R knee after 1072f  Standing balance with lateral reaches R and L to obtain horse shoes. Mod-max assist from PT to prevent knee collapse on the RLE and improve awareness of LOB to the R.    Gait training in WC x 12 ft with max assist +2 from PT to block the RLE.and WC follow. PT required to advance the RLE and block in standing to force WB. Required rest after 12 ft due to pain in the knee.   WC mobility back to room with modified Hemi technique and min cues for safety awareness through door and tight space of room for positioning at bedside. Pt left with call bell in reach and all needs met.        Therapy Documentation Precautions:  Precautions Precautions: Fall Precaution Comments: right hemiplegia, pushes right Restrictions Weight Bearing Restrictions: No    Vital Signs: Therapy Vitals Temp:  98.6 F (37 C) Temp Source: Oral Pulse Rate: 67 Resp: 16 BP: 111/85 Patient Position (if appropriate): Sitting Oxygen Therapy SpO2: 100 % O2 Device: Room Air Pain:   Therapy/Group: Individual Therapy  AusLorie Phenix/27/2019, 2:07 PM

## 2018-06-14 NOTE — NC FL2 (Signed)
[place] MEDICAID FL2 LEVEL OF CARE SCREENING TOOL     IDENTIFICATION  Patient Name: [name] [name] Birthdate: September 20, 1985 Sex: female Admission Date (Current Location): 05/27/2018  [facility] and [place] Number:  Herbalist and Address:  [facility], [place] [address], [place], [place] [address]      Provider Number: [id number]  Attending Physician Name and Address:  Jamse Arn, MD  Relative Name and Phone Number:  Kallie Locks [phone]    Current Level of Care: Other (Comment)(Rehab) Recommended Level of Care: [place] Prior Approval Number:    Date Approved/Denied:   PASRR Number: [id number]  Discharge Plan: SNF    Current Diagnoses: Patient Active Problem List   Diagnosis Date Noted  . Complex tear of lateral meniscus of right knee as current injury   . Primary osteoarthritis of right knee   . Knee pain, right   . 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 Leukopenia   . Acute deep vein thrombosis (DVT) of right peroneal vein   . Hypokalemia   . Nonverbal   . Essential hypertension   . Hyperglycemia   . SAH (subarachnoid hemorrhage) ([place])   . Acute cerebral infarction ([place])   . HCAP (healthcare-associated pneumonia)   . Benign essential HTN   . Acute blood loss anemia   . Right hemiplegia ([place])   . 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 Endotracheal tube present   . Brain aneurysm   . Subarachnoid hemorrhage ([place]) 05/09/2018  . Gastroesophageal reflux disease without esophagitis 10/09/2016  . BMI 31.0-31.9,adult 07/20/2016  . Obesity in pregnancy, antepartum 07/20/2016  . Tobacco abuse 06/01/2015  . Marijuana use 06/23/2014    Orientation RESPIRATION BLADDER Height & Weight     Self, Situation, Place  Normal Incontinent Weight: 131 lb 2.8 oz (59.5 kg) Height:  5\' 10"  (177.8 cm)  BEHAVIORAL  SYMPTOMS/MOOD NEUROLOGICAL BOWEL NUTRITION STATUS      Continent Diet(regular thin liquids diet)  AMBULATORY STATUS COMMUNICATION OF NEEDS Skin   Extensive Assist Verbally Normal                       Personal Care Assistance Level of Assistance  Bathing, Feeding, Dressing Bathing Assistance: Limited assistance Feeding assistance: Limited assistance Dressing Assistance: Limited assistance     Functional Limitations Therapist, nutritional, Speech Sight Info: Impaired Hearing Info: Adequate Speech Info: Impaired    SPECIAL CARE FACTORS FREQUENCY  PT (By licensed PT), OT (By licensed OT), Bowel and bladder program, Speech therapy     PT Frequency: 5x week OT Frequency: 5x week Bowel and Bladder Program Frequency: timed toileting for continence   Speech Therapy Frequency: 5x weel      Contractures Contractures Info: Not present    Additional Factors Info  Code Status, Allergies Code Status Info: Full Code Allergies Info: Sulfa antibiotics, shellfish allergy           Current Medications (06/14/2018):  This is the current hospital active medication list Current Facility-Administered Medications  Medication Dose Route Frequency Provider Last Rate Last Dose  . acetaminophen (TYLENOL) tablet 325-650 mg  325-650 mg Oral Q4H PRN Bary Leriche, PA-C   650 mg at 06/09/18 1053  . alum & mag hydroxide-simeth (MAALOX/MYLANTA) 200-200-20 MG/5ML suspension 30 mL  30 mL Oral Q4H PRN Love, Pamela S, PA-C      . amLODipine (NORVASC)  tablet 2.5 mg  2.5 mg Oral Daily Jamse Arn, MD   2.5 mg at 06/14/18 7408  . baclofen (LIORESAL) tablet 5 mg  5 mg Oral TID Jamse Arn, MD   5 mg at 06/14/18 1448  . bisacodyl (DULCOLAX) suppository 10 mg  10 mg Rectal Daily PRN Bary Leriche, PA-C   10 mg at 06/12/18 1856  . diclofenac sodium (VOLTAREN) 1 % transdermal gel 2 g  2 g Topical QID Jamse Arn, MD   2 g at 06/14/18 3149  . diphenhydrAMINE (BENADRYL) 12.5 MG/5ML elixir 12.5-25 mg   12.5-25 mg Oral Q6H PRN Love, Pamela S, PA-C      . famotidine (PEPCID) tablet 20 mg  20 mg Oral BID Love, Pamela S, PA-C   20 mg at 06/14/18 7026  . feeding supplement (ENSURE ENLIVE) (ENSURE ENLIVE) liquid 237 mL  237 mL Oral BID BM Bary Leriche, PA-C   237 mL at 06/14/18 0823  . guaiFENesin-dextromethorphan (ROBITUSSIN DM) 100-10 MG/5ML syrup 5-10 mL  5-10 mL Oral Q6H PRN Love, Pamela S, PA-C      . loratadine (CLARITIN) tablet 10 mg  10 mg Oral Daily Bary Leriche, PA-C   10 mg at 06/14/18 3785  . montelukast (SINGULAIR) tablet 10 mg  10 mg Oral QHS Love, Pamela S, PA-C   10 mg at 06/13/18 2013  . multivitamin with minerals tablet 1 tablet  1 tablet Oral Daily Bary Leriche, PA-C   1 tablet at 06/14/18 8850  . polyethylene glycol (MIRALAX / GLYCOLAX) packet 17 g  17 g Oral Daily PRN Bary Leriche, PA-C   17 g at 06/11/18 2017  . potassium chloride (K-DUR,KLOR-CON) CR tablet 10 mEq  10 mEq Oral Daily Charlett Blake, MD   10 mEq at 06/14/18 2774  . prochlorperazine (COMPAZINE) tablet 5-10 mg  5-10 mg Oral Q6H PRN Love, Pamela S, PA-C       Or  . prochlorperazine (COMPAZINE) injection 5-10 mg  5-10 mg Intramuscular Q6H PRN Love, Pamela S, PA-C       Or  . prochlorperazine (COMPAZINE) suppository 12.5 mg  12.5 mg Rectal Q6H PRN Love, Pamela S, PA-C      . sodium phosphate (FLEET) 7-19 GM/118ML enema 1 enema  1 enema Rectal Once PRN Love, Pamela S, PA-C      . traZODone (DESYREL) tablet 25-50 mg  25-50 mg Oral QHS PRN Love, Pamela S, PA-C   50 mg at 06/14/18 1287     Discharge Medications: Please see discharge summary for a list of discharge medications.  Relevant Imaging Results:  Relevant Lab Results:   Additional Information SSN: [id number]  Kalli Greenfield, Gardiner Rhyme, LCSW

## 2018-06-14 NOTE — Discharge Summary (Addendum)
Physician Discharge Summary  Patient ID: [name] [name] MRN: [id number] DOB/AGE: [birth year] 32 y.o.  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  Fall EXAM: RIGHT KNEE - 1-2 VIEW COMPARISON:  None. FINDINGS: No evidence of fracture, dislocation, or joint effusion. No evidence of arthropathy or other focal bone abnormality. Soft tissues are unremarkable. IMPRESSION: Negative. Electronically Signed   By: Rolm Baptise M.D.   On: 05/30/2018 09:37   Dg Abd 1 View  Result Date: 06/21/2018 CLINICAL DATA:  Constipation EXAM: ABDOMEN - 1 VIEW COMPARISON:  None. FINDINGS: Scattered large and small bowel gas is noted. Fecal material is noted throughout the colon consistent with a degree of constipation. No obstructive changes are seen. No free intraperitoneal air is noted. No bony abnormality is seen. IMPRESSION: Changes of constipation with significant retained fecal material within the colon. Electronically Signed   By: Inez Catalina M.D.   On: 06/21/2018 11:03   Mr Knee Right Wo Contrast  Result Date: 06/07/2018 CLINICAL DATA:  Right calf DVT with cystic structure in the popliteal fossa. EXAM: MRI OF THE RIGHT KNEE WITHOUT CONTRAST TECHNIQUE: Multiplanar, multisequence MR imaging of the knee was performed. No intravenous contrast was administered. COMPARISON:  None. FINDINGS: MENISCI Medial meniscus:   Intact. Lateral meniscus: Complex tear of the anterior root and horn extending to body junction with borderline extrusion of the body. LIGAMENTS Cruciates:  Intact ACL and PCL.  Mucoid degeneration of the ACL. Collaterals: Medial collateral ligament is intact. Prominent edema adjacent to the proximal MCL. Lateral collateral ligament complex is intact. CARTILAGE Patellofemoral:  No chondral defect. Medial:  Mild partial-thickness cartilage loss. Lateral:  Mild partial-thickness cartilage loss. Joint: Small to moderate joint effusion. Mild edema within Hoffa's fat. No plical thickening. Popliteal Fossa:  Tiny Baker cyst.  Intact popliteus tendon. Extensor Mechanism: Intact quadriceps tendon and patellar tendon. Intact medial and lateral patellar retinaculum. Intact MPFL. Bones: No acute fracture or dislocation. Small focus of marrow edema in the posterior lateral tibial plateau. No focal bone lesion. Other: None. IMPRESSION: 1. Tiny Baker cyst, likely corresponding to the cystic structure seen in the popliteal fossa on recent right lower extremity ultrasound. 2. Complex tear of the lateral meniscus anterior root and horn extending to the body junction with borderline extrusion of the body. 3. Mild edema tracks adjacent to the MCL. This can be incidental but in the appropriate clinical circumstance could represent grade 1 sprain. 4. Mild medial and lateral compartment osteoarthritis. Electronically Signed   By: Titus Dubin M.D.   On: 06/07/2018 02:12   Mr Jodene Nam Lower Extremity Right Wo Contrast  Result Date: 06/06/2018 CLINICAL DATA:  Mass adjacent to knee EXAM: MR ANGIOGRAPHY BILATERAL LOWER EXTREMITIES TECHNIQUE: Multiplanar multisequence MR imaging of the lower extremities was performed without contrast. Multiplanar MR image reconstructions including MIPs were obtained to evaluate the vascular anatomy. CONTRAST:  None COMPARISON:  None.  FINDINGS: The distal aorta is patent. Bilateral common, internal, and  external iliac arteries are patent and nonaneurysmal. Bilateral common femoral, superficial femoral, and profunda femoral arteries are patent and nonaneurysmal. Popliteal arteries are nonaneurysmal and patent. The origin of the left anterior tibial artery does not demonstrate signal. This may be artifactual or a true area of narrowing. The remainder of the left anterior tibial artery is widely patent. Otherwise, there is wide patency of the tibial vessels bilaterally for 3 vessel runoff on the right and patency of the left posterior tibial and peroneal arteries on the left. There is no evidence of popliteal artery aneurysm. This study was a time-of-flight noncontrast MRA. Delineation of anatomy is extremely limited. A mass associated with the right knee is not clearly defined. IMPRESSION: VASCULAR Aorta, iliac arteries, femoral arteries, and popliteal arteries are widely patent. There is no signal at the origin of the left anterior tibial artery which may simply represent artifact. There is otherwise wide patency of the tibial arteries. There is no evidence of popliteal artery aneurysm. NON-VASCULAR This study was not optimized for delineation of anatomy. No obvious mass associated with the right knee is visualized. If there is concern for evaluation of an underlying mass, a dedicated MRI of the knees is recommended. Electronically Signed   By: Marybelle Killings M.D.   On: 06/06/2018 07:46   Vas [name] Lower Extremity Venous (dvt)  Result Date: 06/13/2018  Lower Venous Study Indications: Follow up right posterior tibial and peroneal vein DVT 06/04/18.  Performing Technologist: Maudry Mayhew MHA, RDMS, RVT, RDCS  Examination Guidelines: A complete evaluation includes B-mode imaging, spectral Doppler, color Doppler, and power Doppler as needed of all accessible portions of each vessel. Bilateral testing is considered an integral part of a complete examination. Limited examinations for reoccurring indications may be  performed as noted.  Right Venous Findings: +---------+---------------+---------+-----------+----------+-----------------+          CompressibilityPhasicitySpontaneityPropertiesSummary           +---------+---------------+---------+-----------+----------+-----------------+ CFV      Full           Yes      Yes                                    +---------+---------------+---------+-----------+----------+-----------------+ SFJ      Full                                                           +---------+---------------+---------+-----------+----------+-----------------+ FV Prox  Full                                                           +---------+---------------+---------+-----------+----------+-----------------+ FV Mid   Full                                                           +---------+---------------+---------+-----------+----------+-----------------+ FV DistalFull                                                           +---------+---------------+---------+-----------+----------+-----------------+  PFV      Full                                                           +---------+---------------+---------+-----------+----------+-----------------+ POP      Full           Yes      Yes                                    +---------+---------------+---------+-----------+----------+-----------------+ PTV      None                    No                   Age Indeterminate +---------+---------------+---------+-----------+----------+-----------------+ PERO     None                    No                   Age Indeterminate +---------+---------------+---------+-----------+----------+-----------------+  Left Venous Findings: +---+---------------+---------+-----------+----------+-------+    CompressibilityPhasicitySpontaneityPropertiesSummary +---+---------------+---------+-----------+----------+-------+ CFVFull           Yes      Yes                           +---+---------------+---------+-----------+----------+-------+    Summary: Right: Findings consistent with age indeterminate deep vein thrombosis involving the right posterior tibial vein, and right peroneal vein. Findings appear essentially unchanged compared to previous examination. Left: No evidence of common femoral vein obstruction.  *See table(s) above for measurements and observations. Electronically signed by Monica Martinez MD on 06/13/2018 at 2:03:09 PM.    Final    Vas [name] Lower Extremity Venous (dvt)  Result Date: 06/04/2018  Lower Venous Study Indications: Follow up DVT 05/28/18.  Performing Technologist: Landry Mellow RDMS, RVT  Examination Guidelines: A complete evaluation includes B-mode imaging, spectral Doppler, color Doppler, and power Doppler as needed of all accessible portions of each vessel. Bilateral testing is considered an integral part of a complete examination. Limited examinations for reoccurring indications may be performed as noted.  Right Venous Findings: +---------+---------------+---------+-----------+----------+-----------------+          CompressibilityPhasicitySpontaneityPropertiesSummary           +---------+---------------+---------+-----------+----------+-----------------+ CFV      Full           Yes      Yes                                    +---------+---------------+---------+-----------+----------+-----------------+ SFJ      Full                                                           +---------+---------------+---------+-----------+----------+-----------------+ FV Prox  Full                                                           +---------+---------------+---------+-----------+----------+-----------------+  FV Mid   Full                                                           +---------+---------------+---------+-----------+----------+-----------------+ FV DistalFull                                                            +---------+---------------+---------+-----------+----------+-----------------+ PFV      Full                                                           +---------+---------------+---------+-----------+----------+-----------------+ POP      Full           Yes      Yes                                    +---------+---------------+---------+-----------+----------+-----------------+ PTV      Partial                                      Age Indeterminate +---------+---------------+---------+-----------+----------+-----------------+ PERO     None                                         Age Indeterminate +---------+---------------+---------+-----------+----------+-----------------+  Left Venous Findings: +---+---------------+---------+-----------+----------+-------+    CompressibilityPhasicitySpontaneityPropertiesSummary +---+---------------+---------+-----------+----------+-------+ CFVFull           Yes      Yes                          +---+---------------+---------+-----------+----------+-------+    Summary: Right: Findings consistent with age indeterminate deep vein thrombosis involving the right posterior tibial vein, and right peroneal vein. A cystic structure is found in the popliteal fossa. No propagation of thrombus since last exam. Left: No evidence of common femoral vein obstruction.  *See table(s) above for measurements and observations. Electronically signed by Deitra Mayo MD on 06/04/2018 at 4:35:01 PM.    Final    Vas [name] Lower Extremity Venous (dvt)  Result Date: 05/28/2018  Lower Venous Study Indications: Pain.  Performing Technologist: Maudry Mayhew MHA, RDMS, RVT, RDCS  Examination Guidelines: A complete evaluation includes B-mode imaging, spectral Doppler, color Doppler, and power Doppler as needed of all accessible portions of each vessel. Bilateral testing is considered an integral part of a complete examination.  Limited examinations for reoccurring indications may be performed as noted.  Right Venous Findings: +---------+---------------+---------+-----------+----------+-------+          CompressibilityPhasicitySpontaneityPropertiesSummary +---------+---------------+---------+-----------+----------+-------+ CFV      Full           Yes      Yes                          +---------+---------------+---------+-----------+----------+-------+  SFJ      Full                                                 +---------+---------------+---------+-----------+----------+-------+ FV Prox  Full                                                 +---------+---------------+---------+-----------+----------+-------+ FV Mid   Full                                                 +---------+---------------+---------+-----------+----------+-------+ FV DistalFull                                                 +---------+---------------+---------+-----------+----------+-------+ PFV      Full                                                 +---------+---------------+---------+-----------+----------+-------+ POP      Full           Yes      Yes                          +---------+---------------+---------+-----------+----------+-------+ PTV      None                    No                   Acute   +---------+---------------+---------+-----------+----------+-------+ PERO     None                    No                   Acute   +---------+---------------+---------+-----------+----------+-------+  Left Venous Findings: +---------+---------------+---------+-----------+----------+-------+          CompressibilityPhasicitySpontaneityPropertiesSummary +---------+---------------+---------+-----------+----------+-------+ CFV      Full           Yes      Yes                          +---------+---------------+---------+-----------+----------+-------+ SFJ      Full                                                  +---------+---------------+---------+-----------+----------+-------+ FV Prox  Full                                                 +---------+---------------+---------+-----------+----------+-------+ FV Mid   Full                                                 +---------+---------------+---------+-----------+----------+-------+  FV DistalFull                                                 +---------+---------------+---------+-----------+----------+-------+ PFV      Full                                                 +---------+---------------+---------+-----------+----------+-------+ POP      Full           Yes      Yes                          +---------+---------------+---------+-----------+----------+-------+ PTV      Full                                                 +---------+---------------+---------+-----------+----------+-------+ PERO     Full                                                 +---------+---------------+---------+-----------+----------+-------+    Summary: Right: Findings consistent with acute deep vein thrombosis involving the right posterior tibial vein, and right peroneal vein. No cystic structure found in the popliteal fossa. Left: There is no evidence of deep vein thrombosis in the lower extremity. No cystic structure found in the popliteal fossa.  *See table(s) above for measurements and observations. Electronically signed by Deitra Mayo MD on 05/28/2018 at 5:10:49 PM.    Final    Vas [name] Transcranial Doppler  Result Date: 05/28/2018  Transcranial Doppler Indications: Subarachnoid hemorrhage. Performing Technologist: Abram Sander RVS  Examination Guidelines: A complete evaluation includes B-mode imaging, spectral Doppler, color Doppler, and power Doppler as needed of all accessible portions of each vessel. Bilateral testing is considered an integral part of a complete examination. Limited  examinations for reoccurring indications may be performed as noted.  +----------+-------------+----------+-----------+-------+ RIGHT TCD Right VM (cm)Depth (cm)PulsatilityComment +----------+-------------+----------+-----------+-------+ MCA           72.00                 0.77            +----------+-------------+----------+-----------+-------+ ACA          -30.00                 0.70            +----------+-------------+----------+-----------+-------+ Term ICA      33.00                 1.05            +----------+-------------+----------+-----------+-------+ Opthalmic     21.00                 1.32            +----------+-------------+----------+-----------+-------+ ICA siphon    42.00                 0.64            +----------+-------------+----------+-----------+-------+  Distal ICA    27.00                 1.47            +----------+-------------+----------+-----------+-------+  +----------+------------+----------+-----------+-------+ LEFT TCD  Left VM (cm)Depth (cm)PulsatilityComment +----------+------------+----------+-----------+-------+ MCA          83.00                 0.85            +----------+------------+----------+-----------+-------+ Opthalmic    28.00                 1.42            +----------+------------+----------+-----------+-------+ ICA siphon   33.00                 1.43            +----------+------------+----------+-----------+-------+  +----------------------+----+ Right Lindegaard Ratio2.66 +----------------------+----+  Summary:  Mildly elevated bilateral middle cerebral artery mean flow velocities of unclear significance. Posterior circulation was not evaluated due to poor occipital windows. No definite evidence of vasospasm noted. *See table(s) above for measurements and observations.  Diagnosing physician: Antony Contras MD Electronically signed by Antony Contras MD on 05/28/2018 at 12:44:43 PM.    Final     Labs:   Basic Metabolic Panel: BMP Latest Ref Rng & Units 06/24/2018 06/21/2018 06/17/2018  Glucose 70 - 99 mg/dL 90 89 82  BUN 6 - 20 mg/dL 8 5(L) 6  Creatinine 0.44 - 1.00 mg/dL 0.65 0.59 0.61  BUN/Creat Ratio 9 - 23 - - -  Sodium 135 - 145 mmol/L 138 140 140  Potassium 3.5 - 5.1 mmol/L 4.1 3.8 4.4  Chloride 98 - 111 mmol/L 107 108 106  CO2 22 - 32 mmol/L 23 24 24   Calcium 8.9 - 10.3 mg/dL 9.2 9.2 9.4    CBC: CBC Latest Ref Rng & Units 06/24/2018 06/17/2018 06/10/2018  WBC 4.0 - 10.5 K/uL 3.4(L) 3.7(L) 3.5(L)  Hemoglobin 12.0 - 15.0 g/dL 12.8 12.9 11.3(L)  Hematocrit 36.0 - 46.0 % 40.3 39.5 36.1  Platelets 150 - 400 K/uL 191 210 185    CBG: No results for input(s): GLUCAP in the last 168 hours.  Brief HPI:   [name] [name] is 32 year old female with history of HTN, eczema, chronic allergic rhinitis, visual deficits; who was admitted via ED on 05/09/2018 with bilateral neck pain, vomiting and tightness radiating down her back to buttocks.  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  Cerebral angiogram with attempted coiling not amiable therefore patient underwent left cranial [place] with aneurysm clipping by Dr. Kathyrn Sheriff.  Postop patient developed right-sided weakness with somnolence as well as elevated blood pressures requiring Cleviprex as well as Nimotop to manage vasospasms.  CT head showed resolution of hydrocephalus and repeat CT 11/25 showed left ACA infarct.  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  She tolerated extubation without difficulty and was tolerating regular textures.  Verbal output and activity tolerance were improving and CIR was recommended due to functional decline   Physical Exam  Constitutional: She is oriented to person, place, and time. She appears well-developed and well-nourished.  HENT:  Head: Normocephalic and atraumatic.  Eyes: Pupils are equal, round, and  reactive to light. Conjunctivae and EOM are normal.  Neck: Normal range of motion. Neck supple.  Cardiovascular: Normal rate and regular rhythm.  Pulmonary/Chest: Effort normal  and breath sounds normal. No stridor. No respiratory distress. She exhibits no tenderness.  Abdominal: Soft. Bowel sounds are normal. She exhibits no distension. There is no abdominal tenderness.  Musculoskeletal:        General: Tenderness present. No edema.  Neurological: She is alert and oriented to person, place, and time.  Verbal output improving and more spontaneous in nature. She is able to follow basic one and two step commands. Motor unchanged with RUE 2+/5 proximal and 3+/5 grip.  RLE 3/5. LUE/LLE 4-4+/5  Skin: Skin is warm and dry.  Psychiatric: She has a normal mood and affect. Her behavior is normal.  Nursing note and vitals reviewed.   Hospital Course: Luceil Herrin was admitted to rehab 05/27/2018 for inpatient therapies to consist of PT, ST and OT at least three hours five days a week. Past admission physiatrist, therapy team and rehab RN have worked together to provide customized collaborative inpatient rehab.  Blood pressures have been monitored on twice daily basis and is showing improvement in control.  Nimotop was discontinued by 12/12. Baclofen was added to help manage spasticity and has been titrated upwards without side effects.  Amlodipine has been slowly weaned off to avoid hypotension.  Hypokalemia has resolved with supplementation.  Serial CBC shows acute blood loss anemia has resolved and neutropenia is slowly improving.  Serial check of BMET showed that lytes and renal status WNL.  Kdur has been discontinued as intake has been good.    Dopplers done at admission revealed right peroneal and posterior tibial vein acute DVT.  Repeat Dopplers of 12/17 and 12/25 showed that DVT without propagation and patient without pain or edema RLE therefore IVC filter not indicated..  There was a question of mass  in the right popliteal fossa and VVS was consulted for input.  MRA showed no obvious mass or popliteal artery aneurysm.  Work-up revealed this to be tiny Baker's cyst.  MRI of knee was done as part of this work-up and also revealed complex tear of lateral meniscus, grade 1 MCL sprain and mild medial and lateral compartment osteoarthritis.  Due to right knee instability knee immobilizer and voltaren gel was ordered for support and pain management. Knee pain has resolved and RLE stability has improved. She has been refusing Voltaren gel therefore this was d/c at discharge.   She has developed issues with constipation 12/3 and KUB done revealing extensive stool burden. Bowel program has been augmented to help manage symptoms but patient has refused miralax due to incontinence. Enema was repeated prior to discharge and Miralax changed to Senna S per patient preference. Transient episode of urinary retention due to constipation has resolved. She is incontinent of bladder and requires toileting every 3-4 hours in part due to apraxia.  Mood has been stable. She continues to be limited by right hemiparesis with sensory deficits as well as acute on chronic visual deficits. She has been made good gains during her rehab stay and is currently at min assist to supervision level. She will continue to receive further progressive therapy at SNF.   Rehab course: During patient's stay in rehab weekly team conferences were held to monitor patient's progress, set goals and discuss barriers to discharge. At admission, patient required +2 total assist with basic self-care tasks and total assist with mobility.  She exhibited moderately severe functional cognitive linguistic deficits.  She was able to answer basic yes/no questions reliably, follow one-step command with increased time/cueing and had spontaneous verbal output when talking about familiar  subjects.  She  has had improvement in activity tolerance, balance, postural control  as well as ability to compensate for deficits. She has had improvement in functional use RUE  and RLE as well as improvement in awareness.  She is able to complete ADL tasks with increased time and min assist.  She is able to perform transfers with supervision to contact-guard assist as well as verbal cues for safety.  She is able to ambulate 50 feet with min assist, verbal cues for safety and technique as well as manual facilitation for placement.  She requires min verbal cues to complete tasks with 90% accuracy.  Her verbal output has improved and she is showing improvement in ability to  express basic needs and wants.     Disposition: [place].   Diet: Heart Healthy.   Special Instructions: 1. Use KI as needed for support and comfort.  2. Recommend repeat dopplers in a few weeks or if patient develops pain or edema RLE. 3. Repeat BMET/CBC in 5-7 days. 4. Toilet patient every 3-4 hours while awake. Continue to augment bowel regiment as indicated.    Discharge Instructions    Ambulatory referral to Physical Medicine Rehab   Complete by:  As directed    4 week follow up appointment     Allergies as of 06/24/2018      Reactions   Sulfa Antibiotics Swelling   Causes swelling on the face.   Shellfish Allergy Other (See Comments)   Stomach upset      Medication List    STOP taking these medications   amLODipine 10 MG tablet Commonly known as:  NORVASC   ibuprofen 600 MG tablet Commonly known as:  ADVIL,MOTRIN   niMODipine 30 MG capsule Commonly known as:  NIMOTOP   omeprazole 20 MG capsule Commonly known as:  PRILOSEC     TAKE these medications   acetaminophen 325 MG tablet Commonly known as:  TYLENOL Take 1-2 tablets (325-650 mg total) by mouth every 4 (four) hours as needed for mild pain.   baclofen 20 MG tablet Commonly known as:  LIORESAL Take 1 tablet (20 mg total) by mouth 3 (three) times daily.   bisacodyl 10 MG suppository Commonly known as:   DULCOLAX Place 1 suppository (10 mg total) rectally daily as needed for moderate constipation.   cetirizine 10 MG tablet Commonly known as:  ZYRTEC TAKE 1 TABLET BY MOUTH ONCE DAILY   famotidine 20 MG tablet Commonly known as:  PEPCID Take 1 tablet (20 mg total) by mouth 2 (two) times daily.   feeding supplement (ENSURE ENLIVE) Liqd Take 237 mLs by mouth 2 (two) times daily between meals.   fluticasone 50 MCG/ACT nasal spray Commonly known as:  FLONASE Place 1 spray into both nostrils daily. Start taking on:  June 25, 2018   montelukast 10 MG tablet Commonly known as:  SINGULAIR TAKE 1 TABLET BY MOUTH ONCE DAILY AT BEDTIME   multivitamin with minerals Tabs tablet Take 1 tablet by mouth daily. Start taking on:  June 25, 2018   senna-docusate 8.6-50 MG tablet Commonly known as:  Senokot-S Take 2 tablets by mouth 2 (two) times daily.   sodium chloride 0.65 % Soln nasal spray Commonly known as:  OCEAN Place 1 spray into both nostrils 2 (two) times daily before lunch and supper.       Contact information for follow-up providers    Jamse Arn, MD. Call.   Specialty:  Physical Medicine and Rehabilitation Why:  Office will call with follow up appointment Contact information: [address] [place] [address] [phone]        Consuella Lose, MD. Call.   Specialty:  Neurosurgery Why:  for follow up appointment in 2-3 weeks.  Contact information: [address] [place] [address] [phone]            Contact information for after-discharge care    Destination    [facility] .   Service:  Skilled Nursing Contact information: [place] [phone]                  Signed: Bary Leriche 06/24/2018, 11:37 AM   Patient seen and examined by me on day of discharge. Delice Lesch, MD, ABPMR

## 2018-06-14 NOTE — Progress Notes (Signed)
Social Work Patient ID: [name] [name], female   DOB: [birth year], 32 y.o.   MRN: [id number] Spoke with Mom via telephone to discuss team conference and working on NH bed for next Friday. She wants to be sure she will have one since she has no way to provide 24 hr care to her if she had to go home.

## 2018-06-15 ENCOUNTER — Inpatient Hospital Stay (HOSPITAL_COMMUNITY): Payer: Medicaid Other | Admitting: Occupational Therapy

## 2018-06-15 ENCOUNTER — Inpatient Hospital Stay (HOSPITAL_COMMUNITY): Payer: Medicaid Other

## 2018-06-15 MED ORDER — BACLOFEN 10 MG PO TABS
10.0000 mg | ORAL_TABLET | Freq: Three times a day (TID) | ORAL | Status: DC
  Administered 2018-06-15 – 2018-06-18 (×9): 10 mg via ORAL
  Filled 2018-06-15 (×9): qty 1

## 2018-06-15 NOTE — Progress Notes (Signed)
Occupational Therapy Session Note  Patient Details  Name: [name] [name] MRN: [id number] Date of Birth: Jun 29, 1985  Today's Date: 06/15/2018 OT Individual Time: 9476-5465 OT Individual Time Calculation (min): 60 min    Short Term Goals: Week 1:  OT Short Term Goal 1 (Week 1): Pt will consistently complete basic transfers with +1 assist in order to reduce caregiver burden OT Short Term Goal 1 - Progress (Week 1): Met OT Short Term Goal 2 (Week 1): Pt will recall hemi-dressing technique during UB dressing with min cuing OT Short Term Goal 2 - Progress (Week 1): Met OT Short Term Goal 3 (Week 1): Pt will complete sit>stand at sink with +1 assist during LB dressing task in order to reduce caregiver burden OT Short Term Goal 3 - Progress (Week 1): Met Week 2:  OT Short Term Goal 1 (Week 2): Pt will complete squat pivot transfer to toilet/BSC with +1 assist OT Short Term Goal 1 - Progress (Week 2): Progressing toward goal OT Short Term Goal 2 (Week 2): Pt will stand to complete 1 grooming task at sink with max A standing balance in order to address functional standing balance/endurance OT Short Term Goal 2 - Progress (Week 2): Met OT Short Term Goal 3 (Week 2): Pt will initiate use of R UE during functional task with min cuing OT Short Term Goal 3 - Progress (Week 2): Not met OT Short Term Goal 4 (Week 2): Pt will complete sit>stand from EOB with mod A +1 during dressing routine OT Short Term Goal 4 - Progress (Week 2): Met Week 3:  OT Short Term Goal 1 (Week 3): Pt will complete LB clothing management sit>stand with +1 assist in order to reduce caregiver burden OT Short Term Goal 2 (Week 3): Pt will initiate use of R UE during functional task with min cuing OT Short Term Goal 3 (Week 3): Pt will complete slide board transfer to drop arm BSC with min A  Skilled Therapeutic Interventions/Progress Updates:    1:1 Pt in bed when arrived. Pt able to gather her clothes from her bag with setup A.  Pt came to EOB from seated position in bed with min A and extra time. Pt threaded pants with min A for positioning of right LE in crossed position. Max A for donning TEDS, shoes and KI. Pt performed sit to stand with mod A - min A to maintain standing balance while A with pulling up pants with use of RW for UE support. (RW removed) and pt performed stand pivot to the chair with mod A. Pt transitioned into the bathroom for trial timed toileting. Pt performed stand pivot with the grab bar with mod A over to the commode and performed sit to stand with mod A with RW for clothing management with max A. Pt had incontinent brief and still unable to void any more on commode. Stand pivot back to the w/c with mod A. Pt perform tooth brushing at the sink with setup. Pt able to doff dirty shirt and don clean one with setup  Transitioned down to the gym to continue to work on right UE use and coordination. Performed bilateral tasks including pumping up a ball,  hitting a ball with boomwacker and zoom ball. Pt also performed AROM bilaterally while holding a boomwacker to encourage symmetric movement and increased use of right UE. Pt also able to perform wrist hand held manipulative with right hand with extra time; increasing her time the second time.   Therapy  Documentation Precautions:  Precautions Precautions: Fall Precaution Comments: right hemiplegia, pushes right Restrictions Weight Bearing Restrictions: No Pain:  tenderness behind the knee but able to continue with therapy   Therapy/Group: Individual Therapy  Willeen Cass [facility] 06/15/2018, 1:09 PM

## 2018-06-15 NOTE — Progress Notes (Signed)
Occupational Therapy Session Note  Patient Details  Name: [name] [name] MRN: [id number] Date of Birth: 1986-04-10  Today's Date: 06/15/2018 OT Individual Time: 3643-8377 OT Individual Time Calculation (min): 42 min    Short Term Goals: Week 1:  OT Short Term Goal 1 (Week 1): Pt will consistently complete basic transfers with +1 assist in order to reduce caregiver burden OT Short Term Goal 1 - Progress (Week 1): Met OT Short Term Goal 2 (Week 1): Pt will recall hemi-dressing technique during UB dressing with min cuing OT Short Term Goal 2 - Progress (Week 1): Met OT Short Term Goal 3 (Week 1): Pt will complete sit>stand at sink with +1 assist during LB dressing task in order to reduce caregiver burden OT Short Term Goal 3 - Progress (Week 1): Met  Skilled Therapeutic Interventions/Progress Updates:    1:1. Pt reporting soiled brief and no pain. Pt rolls B in bed to doff clothing and wash peri area. Pt dons new pants EOB with S for threading BLE and A to don KI and shoes for time management. Pt completes sit to stand EOB with knee block and lifting A (30%) and OT advances pants past hips. Pt turns and sits in w/c with mod A for knee block. Pt completes box and blocks assessment RUE:13, LUE 34. Pt stands with min A at sink with knee block to move dishware from counter top to lowest cabinet shelf with min A for far end reach of ROM and forced use of RUE. No dropping of dishes. Exited session with pt seated in w/c, belt alarm on, call light in reach and NT aware of positioning and needing to change soiled bed sheets.   Therapy Documentation Precautions:  Precautions Precautions: Fall Precaution Comments: right hemiplegia, pushes right Restrictions Weight Bearing Restrictions: No General:     Therapy/Group: Individual Therapy  Tonny Branch 06/15/2018, 1:44 PM

## 2018-06-15 NOTE — Progress Notes (Signed)
[place] PHYSICAL MEDICINE & REHABILITATION PROGRESS NOTE  Subjective/Complaints: Patient seen sitting up in bed this morning about to work with therapies.  She states she slept well overnight.  ROS: Denies CP, shortness of breath, nausea, vomiting, diarrhea.  Objective: Vital Signs: Blood pressure 108/80, pulse (!) 52, temperature 97.9 F (36.6 C), temperature source Oral, resp. rate 16, height 5\' 10"  (1.778 m), weight 61.2 kg, SpO2 100 %, currently breastfeeding. No results found. No results for input(s): WBC, HGB, HCT, PLT in the last 72 hours. No results for input(s): NA, K, CL, CO2, GLUCOSE, BUN, CREATININE, CALCIUM in the last 72 hours.  Physical Exam: BP 108/80 (BP Location: Left Arm)   Pulse (!) 52   Temp 97.9 F (36.6 C) (Oral)   Resp 16   Ht 5\' 10"  (1.778 m)   Wt 61.2 kg   SpO2 100%   BMI 19.36 kg/m  Constitutional: No distress . Vital signs reviewed.  Obese. HENT: Normocephalic.  Atraumatic. Eyes: EOMI. No discharge. Cardiovascular: RRR.  No JVD. Respiratory: CTA bilaterally.  Normal effort. GI: BS +. Non-distended. Musc: No edema or tenderness in extremities. Neurological: She is alert Delayed processing, improving.  Right upper extremity: Proximally 1+/5, 2/5 hand grip Right lower extremity: 0/5 proximal to distal, unchanged Left upper extremity: 4/5 proximal distal, stable Left lower extremity: 4-/5 proximal distal, stable Knee flexor tone noted Skin: She is not diaphoretic. Crani incision c/d/i  Psychiatric: Improving affect    Assessment/Plan: 1. Functional deficits secondary to left ACA with SAH which require 3+ hours per day of interdisciplinary therapy in a comprehensive inpatient rehab setting.  Physiatrist is providing close team supervision and 24 hour management of active medical problems listed below.  Physiatrist and rehab team continue to assess barriers to discharge/monitor patient progress toward functional and medical goals  Care  Tool:  Bathing    Body parts bathed by patient: Chest, Abdomen, Right arm, Right upper leg, Left upper leg, Face, Front perineal area, Right lower leg, Left lower leg, Left arm, Buttocks   Body parts bathed by helper: Left arm, Buttocks     Bathing assist Assist Level: Supervision/Verbal cueing     Upper Body Dressing/Undressing Upper body dressing   What is the patient wearing?: Pull over shirt, Bra    Upper body assist Assist Level: Minimal Assistance - Patient > 75%    Lower Body Dressing/Undressing Lower body dressing      What is the patient wearing?: Incontinence brief, Pants     Lower body assist Assist for lower body dressing: Minimal Assistance - Patient > 75%     Toileting Toileting Toileting Activity did not occur (Clothing management and hygiene only): Safety/medical concerns  Toileting assist Assist for toileting: Total Assistance - Patient < 25%     Transfers Chair/bed transfer  Transfers assist  Chair/bed transfer activity did not occur: N/A  Chair/bed transfer assist level: Minimal Assistance - Patient > 75%     Locomotion Ambulation   Ambulation assist      Assist level: Moderate Assistance - Patient 50 - 74% Assistive device: (hall rail) Max distance: 25   Walk 10 feet activity   Assist     Assist level: Moderate Assistance - Patient - 50 - 74%     Walk 50 feet activity   Assist Walk 50 feet with 2 turns activity did not occur: Safety/medical concerns         Walk 150 feet activity   Assist Walk 150 feet activity did not  occur: Safety/medical concerns         Walk 10 feet on uneven surface  activity   Assist Walk 10 feet on uneven surfaces activity did not occur: Safety/medical concerns         Wheelchair     Assist Will patient use wheelchair at discharge?: (TBD)   Wheelchair activity did not occur: Safety/medical concerns  Wheelchair assist level: Supervision/Verbal cueing Max wheelchair  distance: 150'    Wheelchair 50 feet with 2 turns activity    Assist    Wheelchair 50 feet with 2 turns activity did not occur: Safety/medical concerns   Assist Level: Supervision/Verbal cueing   Wheelchair 150 feet activity     Assist Wheelchair 150 feet activity did not occur: Safety/medical concerns   Assist Level: Supervision/Verbal cueing      Medical Problem List and Plan: 1.  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  Continue CIR  2.  DVT Prophylaxis/Anticoagulation: Mechanical: Sequential compression devices, below knee Bilateral lower extremities Right peroneal and posterior tibial vein acute DVT-repeat Dopplers suggesting persistent DVT without propagation, repeat dopplers again showing persistent DVT, will consider IVC filter 3. Pain Management: Tylenol prn.   Right knee pain-, MRA reviewed no significant source identified.  Appreciate vascular Recs, MRI reviewed showing Baker's cyst, lateral meniscal tear, and OA.     Voltaren gel    Knee immobilizer   Improved 4. Mood: Team to provide ego support.  LCSW to follow for evaluation and support.  5. Neuropsych: This patient is not fully capable of making decisions on her own behalf. 6. Skin/Wound Care: Monitor incision for healing.  7. Fluids/Electrolytes/Nutrition: Monitor I/Os. Offer supplements if intake poor.   BMP normal on 12/23 8. HTN: Monitor BP bid  Amlodipine decreased to 7.5 on 12/18, decreased to 5 on 12/20, decreased to 2.5 on 12/24   Vitals:   06/14/18 1956 06/15/18 0542  BP: (!) 137/97 108/80  Pulse: 63 (!) 52  Resp: 16 16  Temp: 98.3 F (36.8 C) 97.9 F (36.6 C)  SpO2: 100% 100%   Labile on 12/28 9. Moraxella HCAP: Has completed antibiotic regimen on 12/4 10. ABLA: Continue to monitor H/H serially.   Hemoglobin 11.3 on 12/23  Continue to monitor 11.  Impaired fasting glucose: Likely stress related.   HbA1c 4.3 on  12/10- normal 12.  HypoK - supplement KCL  Potassium 4.2 on 12/23  Labs ordered for Monday  Continue to monitor 13.  Spastic hemiplegia  Right lower extremity with increased hamstring tone  Tizanidine qhs DC'd on 12/19  Baclofen 5 3 times daily started on 12/19, increased to 10 mg on 12/28  Cont ROM   Educated regarding appropriate positioning 14.  Leukopenia  WBCs 3.5 on 12/23  Labs ordered for Monday  Continue to monitor   LOS: 19 days A FACE TO FACE EVALUATION WAS PERFORMED  [name] [name] Lorie Phenix 06/15/2018, 12:24 PM

## 2018-06-16 ENCOUNTER — Inpatient Hospital Stay (HOSPITAL_COMMUNITY): Payer: Medicaid Other | Admitting: Occupational Therapy

## 2018-06-16 NOTE — Progress Notes (Signed)
[place] PHYSICAL MEDICINE & REHABILITATION PROGRESS NOTE  Subjective/Complaints: Patient seen laying in bed this morning.  She states she slept well overnight.  She denies complaints.  ROS: Denies CP, shortness of breath, nausea, vomiting, diarrhea.  Objective: Vital Signs: Blood pressure (!) 112/97, pulse 72, temperature 98.3 F (36.8 C), temperature source Oral, resp. rate 15, height 5\' 10"  (1.778 m), weight 69 kg, SpO2 100 %, currently breastfeeding. No results found. No results for input(s): WBC, HGB, HCT, PLT in the last 72 hours. No results for input(s): NA, K, CL, CO2, GLUCOSE, BUN, CREATININE, CALCIUM in the last 72 hours.  Physical Exam: BP (!) 112/97 (BP Location: Left Arm)   Pulse 72   Temp 98.3 F (36.8 C) (Oral)   Resp 15   Ht 5\' 10"  (1.778 m)   Wt 69 kg   SpO2 100%   BMI 21.83 kg/m  Constitutional: No distress . Vital signs reviewed.  Obese. HENT: Normocephalic.  Atraumatic. Eyes: EOMI. No discharge. Cardiovascular: RRR.  No JVD. Respiratory: CTA bilaterally.  Normal effort. GI: BS +. Non-distended. Musc: No edema or tenderness in extremities. Neurological: She is alert Delayed processing, improving.  Right upper extremity: Proximally 1+/5, 2/5 hand grip Right lower extremity: 0/5 proximal to distal, unchanged Left upper extremity: 4/5 proximal distal, unchanged Left lower extremity: 4-/5 proximal distal, unchanged Knee flexor tone noted Skin: She is not diaphoretic. Crani incision c/d/i  Psychiatric: Improving affect    Assessment/Plan: 1. Functional deficits secondary to left ACA with SAH which require 3+ hours per day of interdisciplinary therapy in a comprehensive inpatient rehab setting.  Physiatrist is providing close team supervision and 24 hour management of active medical problems listed below.  Physiatrist and rehab team continue to assess barriers to discharge/monitor patient progress toward functional and medical goals  Care  Tool:  Bathing    Body parts bathed by patient: Chest, Abdomen, Right arm, Right upper leg, Left upper leg, Face, Front perineal area, Right lower leg, Left lower leg, Left arm, Buttocks   Body parts bathed by helper: Left arm, Buttocks     Bathing assist Assist Level: Supervision/Verbal cueing     Upper Body Dressing/Undressing Upper body dressing   What is the patient wearing?: Pull over shirt    Upper body assist Assist Level: Set up assist    Lower Body Dressing/Undressing Lower body dressing      What is the patient wearing?: Incontinence brief, Pants     Lower body assist Assist for lower body dressing: Minimal Assistance - Patient > 75%     Toileting Toileting Toileting Activity did not occur (Clothing management and hygiene only): Safety/medical concerns  Toileting assist Assist for toileting: Maximal Assistance - Patient 25 - 49%     Transfers Chair/bed transfer  Transfers assist  Chair/bed transfer activity did not occur: N/A  Chair/bed transfer assist level: Moderate Assistance - Patient 50 - 74%     Locomotion Ambulation   Ambulation assist      Assist level: Moderate Assistance - Patient 50 - 74% Assistive device: (hall rail) Max distance: 25   Walk 10 feet activity   Assist     Assist level: Moderate Assistance - Patient - 50 - 74%     Walk 50 feet activity   Assist Walk 50 feet with 2 turns activity did not occur: Safety/medical concerns         Walk 150 feet activity   Assist Walk 150 feet activity did not occur: Safety/medical concerns  Walk 10 feet on uneven surface  activity   Assist Walk 10 feet on uneven surfaces activity did not occur: Safety/medical concerns         Wheelchair     Assist Will patient use wheelchair at discharge?: (TBD)   Wheelchair activity did not occur: Safety/medical concerns  Wheelchair assist level: Supervision/Verbal cueing Max wheelchair distance: 150'     Wheelchair 50 feet with 2 turns activity    Assist    Wheelchair 50 feet with 2 turns activity did not occur: Safety/medical concerns   Assist Level: Supervision/Verbal cueing   Wheelchair 150 feet activity     Assist Wheelchair 150 feet activity did not occur: Safety/medical concerns   Assist Level: Supervision/Verbal cueing      Medical Problem List and Plan: 1.  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  Continue CIR  2.  DVT Prophylaxis/Anticoagulation: Mechanical: Sequential compression devices, below knee Bilateral lower extremities Right peroneal and posterior tibial vein acute DVT-repeat Dopplers suggesting persistent DVT without propagation, repeat dopplers again showing persistent DVT, will consider IVC filter 3. Pain Management: Tylenol prn.   Right knee pain-, MRA reviewed no significant source identified.  Appreciate vascular Recs, MRI reviewed showing Baker's cyst, lateral meniscal tear, and OA.     Voltaren gel    Knee immobilizer   Improved 4. Mood: Team to provide ego support.  LCSW to follow for evaluation and support.  5. Neuropsych: This patient is not fully capable of making decisions on her own behalf. 6. Skin/Wound Care: Monitor incision for healing.  7. Fluids/Electrolytes/Nutrition: Monitor I/Os. Offer supplements if intake poor.   BMP normal on 12/23 8. HTN: Monitor BP bid  Amlodipine decreased to 7.5 on 12/18, decreased to 5 on 12/20, decreased to 2.5 on 12/24   Vitals:   06/16/18 1415 06/16/18 1956  BP: 124/82 (!) 112/97  Pulse: 68 72  Resp: 17 15  Temp:  98.3 F (36.8 C)  SpO2: 100% 100%   Relatively controlled on 12/29 9. Moraxella HCAP: Has completed antibiotic regimen on 12/4 10. ABLA: Continue to monitor H/H serially.   Hemoglobin 11.3 on 12/23  Continue to monitor 11.  Impaired fasting glucose: Likely stress related.   HbA1c 4.3 on 12/10- normal 12.   HypoK - supplement KCL  Potassium 4.2 on 12/23  Labs ordered for tomorrow  Continue to monitor 13.  Spastic hemiplegia  Right lower extremity with increased hamstring tone  Tizanidine qhs DC'd on 12/19  Baclofen 5 3 times daily started on 12/19, increased to 10 mg on 12/28  Cont ROM   Educated regarding appropriate positioning 14.  Leukopenia  WBCs 3.5 on 12/23  Labs ordered for tomorrow  Continue to monitor   LOS: 20 days A FACE TO FACE EVALUATION WAS PERFORMED  Ankit Lorie Phenix 06/16/2018, 8:39 PM

## 2018-06-16 NOTE — Progress Notes (Signed)
Occupational Therapy Session Note  Patient Details  Name: [name] [name] MRN: [id number] Date of Birth: 08/20/1985  Today's Date: 06/16/2018 OT Individual Time: 1287-8676 OT Individual Time Calculation (min): 55 min    Short Term Goals: Week 3:  OT Short Term Goal 1 (Week 3): Pt will complete LB clothing management sit>stand with +1 assist in order to reduce caregiver burden OT Short Term Goal 2 (Week 3): Pt will initiate use of R UE during functional task with min cuing OT Short Term Goal 3 (Week 3): Pt will complete slide board transfer to drop arm BSC with min A  Skilled Therapeutic Interventions/Progress Updates:    Pt seen for OT session focusing on ADL re-training, R UE functional use and functional standing balance/endurance. Pt sitting up in w/c upon arrival with mother present assisting with grooming tasks. Pt letting mother provide total A for grooming tasks, mother amazed when pt able to wash face and brush teeth independently.  Pt stood at sink with min A and blocking of R knee to complete oral care task, using B UEs to assist with set-up of activity. Pt tolerating ~3 minutes in standing before requiring seated rest break. She self propelled w/c to therapy gym using hemi technique with supervision. Mod A squat pivot transfer to EOB.  Completed reciprocal scooting to R and to L focusing on anterior weight shift and functional use of B UEs during mobility in prep for transfers. VCs and min A for management of R LE.  Completed towel folding activity seated EOM. Completed with B UEs and then with R UE only. Displays motor planning deficits in execution of task. Progressed to completing in standing position. Pt unable to motor plan functional use of R UE to fold towel using only R UE when in standing position in dual attention task. Completed min A squat pivot transfer back to w/c.  She returned to room at end of session, left sitting up in w/c with all needs in reach and mother  present, chair belt alarm donned.   Therapy Documentation Precautions:  Precautions Precautions: Fall Precaution Comments: right hemiplegia, pushes right Restrictions Weight Bearing Restrictions: No Pain:   No/denies pain   Therapy/Group: Individual Therapy  Leniyah Martell L 06/16/2018, 6:43 AM

## 2018-06-17 ENCOUNTER — Encounter (HOSPITAL_COMMUNITY): Payer: Medicaid Other | Admitting: Psychology

## 2018-06-17 ENCOUNTER — Inpatient Hospital Stay (HOSPITAL_COMMUNITY): Payer: Medicaid Other

## 2018-06-17 ENCOUNTER — Inpatient Hospital Stay (HOSPITAL_COMMUNITY): Payer: Medicaid Other | Admitting: Occupational Therapy

## 2018-06-17 ENCOUNTER — Inpatient Hospital Stay (HOSPITAL_COMMUNITY): Payer: Medicaid Other | Admitting: Speech Pathology

## 2018-06-17 ENCOUNTER — Inpatient Hospital Stay (HOSPITAL_COMMUNITY): Payer: Medicaid Other | Admitting: Physical Therapy

## 2018-06-17 DIAGNOSIS — R4701 Aphasia: Secondary | ICD-10-CM

## 2018-06-17 DIAGNOSIS — F4320 Adjustment disorder, unspecified: Secondary | ICD-10-CM

## 2018-06-17 LAB — CBC
HCT: 39.5 % (ref 36.0–46.0)
Hemoglobin: 12.9 g/dL (ref 12.0–15.0)
MCH: 31.9 pg (ref 26.0–34.0)
MCHC: 32.7 g/dL (ref 30.0–36.0)
MCV: 97.8 fL (ref 80.0–100.0)
NRBC: 0 % (ref 0.0–0.2)
Platelets: 210 10*3/uL (ref 150–400)
RBC: 4.04 MIL/uL (ref 3.87–5.11)
RDW: 12 % (ref 11.5–15.5)
WBC: 3.7 10*3/uL — ABNORMAL LOW (ref 4.0–10.5)

## 2018-06-17 LAB — BASIC METABOLIC PANEL
Anion gap: 10 (ref 5–15)
BUN: 6 mg/dL (ref 6–20)
CO2: 24 mmol/L (ref 22–32)
CREATININE: 0.61 mg/dL (ref 0.44–1.00)
Calcium: 9.4 mg/dL (ref 8.9–10.3)
Chloride: 106 mmol/L (ref 98–111)
GFR calc Af Amer: >60 mL/min (ref >60)
GFR calc non Af Amer: >60 mL/min (ref >60)
Glucose, Bld: 82 mg/dL (ref 70–99)
Potassium: 4.4 mmol/L (ref 3.5–5.1)
Sodium: 140 mmol/L (ref 135–145)

## 2018-06-17 NOTE — Progress Notes (Signed)
Occupational Therapy Session Note  Patient Details  Name: [name] [name] MRN: [id number] Date of Birth: September 18, 1985  Today's Date: 06/17/2018 OT Individual Time: 0835-1000 OT Individual Time Calculation (min): 85 min    Short Term Goals: Week 3:  OT Short Term Goal 1 (Week 3): Pt will complete LB clothing management sit>stand with +1 assist in order to reduce caregiver burden OT Short Term Goal 2 (Week 3): Pt will initiate use of R UE during functional task with min cuing OT Short Term Goal 3 (Week 3): Pt will complete slide board transfer to drop arm BSC with min A  Skilled Therapeutic Interventions/Progress Updates:    Pt seen for OT session focusing on R neuro re-ed and functional standing balance/tolerance. Pt sitting up in w/c upon arrival, agreeable to tx session.  She was already dressed and bathed from previous tx session.  Pt taken to therapy gym in w/c. Completed mod A squat pivot transfer to EOM. Seated EOM, completed dynamic sitting balance activity, required to reach overhead, cross midline, reach to floor, etc. to place and remove items with R UE focusing on weight shifting and core stabilization. Completed with supervision and increased time for R UE ROM, still limited with elbow extension though improving.  Pt able to achieve full R shoulder ROM when moving in isolation, however, unable to complete full ROM during B activity. Returned to supine on mat, completed x2 sets of 10 shoulder press using #1 dowel rod with B UEs holding onto rod.  Addressed voluntary movement and motor commands with various tasks of R UE. Pt displays ideational apraxia movements and deficits during tasks using R UE. Completed min A squat pivot transfer back to w/c. Self propelled w/c using hemi technique with supervision back to room. Pt left sitting up in w/c with all needs in reach and chair belt alarm donned.   Therapy Documentation Precautions:  Precautions Precautions: Fall Precaution Comments:  right hemiplegia, pushes right Restrictions Weight Bearing Restrictions: No Pain:   No/denies pain   Therapy/Group: Individual Therapy  Meztli Llanas L 06/17/2018, 7:10 AM

## 2018-06-17 NOTE — Progress Notes (Signed)
Physical Therapy Session Note  Patient Details  Name: [name] [name] MRN: [id number] Date of Birth: [birth year]  Today's Date: 06/17/2018 PT Individual Time: 1300-1400 PT Individual Time Calculation (min): 60 min   Short Term Goals: Week 3:  PT Short Term Goal 1 (Week 3): Pt will perform slideboard transfers consistent S PT Short Term Goal 2 (Week 3): Pt will perform bed mobility min guard PT Short Term Goal 3 (Week 3): Pt will ambulate x25' modA +1  Skilled Therapeutic Interventions/Progress Updates: Pt received seated in w/c, denies pain and agreeable to treatment. Pt reports brief "might be" wet. Sit <>stand with minA and RW; standbyA for balance while therapist performed clothing change totalA as pt had soaked through brief and pants. W/c propulsion S with hemi technique to/from gym. Gait x2 trials at 10', 15' with standard walker and modA for RLE progression and stance control, +2 w/c follow for safety, ace wrap dorsiflexion assist for swing through. Ascent/descent 8 steps 3" height with B handrails and modA, step-to pattern. Stand pivot w/c <>mat table with walker and minA to L side. Standing balance with BUE reaching to retrieve and replace horseshoes from overhead height. Returned to room as above; remained seated in w/c, alarm intact and all needs in reach at completion of session.      Therapy Documentation Precautions:  Precautions Precautions: Fall Precaution Comments: right hemiplegia, pushes right Restrictions Weight Bearing Restrictions: No    Therapy/Group: Individual Therapy  Corliss Skains 06/17/2018, 1:58 PM

## 2018-06-17 NOTE — Progress Notes (Signed)
Speech Language Pathology Daily Session Note  Patient Details  Name: [name] [name] MRN: [id number] Date of Birth: 11/19/1985  Today's Date: 06/17/2018 SLP Individual Time: 1430-1500 SLP Individual Time Calculation (min): 30 min  Short Term Goals: Week 3: SLP Short Term Goal 1 (Week 3): Pt will use compensatory strategies for mildly complex word finding during functional tasks with supervision cues.  SLP Short Term Goal 2 (Week 3): Pt will complete basic, familiar tasks with supervision cues for functional problem solving.   SLP Short Term Goal 3 (Week 3): Pt will sustain her attention to basic, familiar tasks for 30 minutes with supervision cues for redirection.    Skilled Therapeutic Interventions: Skilled treatment session focused on speech goals. SLP facilitated session by providing extra time and Min A question and verbal cues for word-finding at the phrase and sentence level during a complex, verbal expression task. Patient demonstrated sustained attention to task for ~25 minutes with Mod I. Patient left upright in wheelchair with alarm on and family present. Continue with current plan of care.      Pain No/Denies Pain   Therapy/Group: Individual Therapy  [name] [name] 06/17/2018, 3:38 PM

## 2018-06-17 NOTE — Progress Notes (Signed)
Occupational Therapy Session Note  Patient Details  Name: [name] [name] MRN: [id number] Date of Birth: [birth year]  Today's Date: 06/17/2018 OT Individual Time: 1660-6004 OT Individual Time Calculation (min): 47 min    Short Term Goals: Week 3:  OT Short Term Goal 1 (Week 3): Pt will complete LB clothing management sit>stand with +1 assist in order to reduce caregiver burden OT Short Term Goal 2 (Week 3): Pt will initiate use of R UE during functional task with min cuing OT Short Term Goal 3 (Week 3): Pt will complete slide board transfer to drop arm BSC with min A  Skilled Therapeutic Interventions/Progress Updates:    Session focused on ADL retraining and precaution adherence. Pt received supine in bed with no c/o pain, easily awaken and agreeable to therapy. Pt completed bed mobility with no physical A or cueing, but use of bed rails. Min A provided to don R knee immobilizer and teds. Pt completed squat pivot transfer to w/c with CGA only heavy support on w/c to stabilize. Vc for keeping R leg extended and positioning prior to transfer. Pt sat in front of sink and required sequencing cues to progress through bathing. Pt donned UB clothing with set up. Mod A provided to don pants, increased assistance provided d/t time constraints. Pt completed sit <> stand x5 at sink, requiring frequent seated rest breaks for fatigue. Despite R KI, R knee was buckling in brace. Pt reported discomfort with knee blocking. Pt was assisted in set up of her breakfast tray. Pt left in w/c, Chair alarm belt was fastened and all needs met.   Therapy Documentation Precautions:  Precautions Precautions: Fall Precaution Comments: right hemiplegia, pushes right Restrictions Weight Bearing Restrictions: No Vital Signs: Therapy Vitals Temp: 98.3 F (36.8 C) Temp Source: Oral Pulse Rate: (!) 59 Resp: 14 BP: 109/83 Patient Position (if appropriate): Lying Oxygen Therapy SpO2: 100 % O2 Device: Room  Air Pain: Pain Assessment Pain Scale: 0-10 Pain Score: 0-No pain   Therapy/Group: Individual Therapy  Curtis Sites 06/17/2018, 8:28 AM

## 2018-06-17 NOTE — Progress Notes (Signed)
[place] PHYSICAL MEDICINE & REHABILITATION PROGRESS NOTE  Subjective/Complaints: Patient seen sitting up in a chair with therapy this morning.  She states she slept well overnight.  ROS: Denies CP, shortness of breath, nausea, vomiting, diarrhea.  Objective: Vital Signs: Blood pressure 109/83, pulse (!) 59, temperature 98.3 F (36.8 C), temperature source Oral, resp. rate 14, height 5\' 10"  (1.778 m), weight 68.8 kg, SpO2 100 %, currently breastfeeding. No results found. Recent Labs    06/17/18 0605  WBC 3.7*  HGB 12.9  HCT 39.5  PLT 210   Recent Labs    06/17/18 0605  NA 140  K 4.4  CL 106  CO2 24  GLUCOSE 82  BUN 6  CREATININE 0.61  CALCIUM 9.4    Physical Exam: BP 109/83 (BP Location: Left Arm)   Pulse (!) 59   Temp 98.3 F (36.8 C) (Oral)   Resp 14   Ht 5\' 10"  (1.778 m)   Wt 68.8 kg   SpO2 100%   BMI 21.76 kg/m  Constitutional: No distress . Vital signs reviewed.  Obese. HENT: Normocephalic.  Atraumatic. Eyes: EOMI. No discharge. Cardiovascular: RRR.  No JVD. Respiratory: CTA bilaterally.  Normal effort. GI: BS +. Non-distended. Musc: No edema or tenderness in extremities. Neurological: She is alert Delayed processing, improving.  Right upper extremity: Proximally 1+/5, 2/5 hand grip, stable Right lower extremity: 0/5 proximal to distal, unchanged, stable Left upper extremity: 4/5 proximal distal, unchanged Left lower extremity: 4-/5 proximal distal, unchanged Knee flexor tone noted Skin: She is not diaphoretic. Crani incision c/d/i  Psychiatric: Improving affect    Assessment/Plan: 1. Functional deficits secondary to left ACA with SAH which require 3+ hours per day of interdisciplinary therapy in a comprehensive inpatient rehab setting.  Physiatrist is providing close team supervision and 24 hour management of active medical problems listed below.  Physiatrist and rehab team continue to assess barriers to discharge/monitor patient progress  toward functional and medical goals  Care Tool:  Bathing    Body parts bathed by patient: Chest, Abdomen, Right arm, Right upper leg, Left upper leg, Face, Front perineal area, Right lower leg, Left lower leg, Left arm, Buttocks   Body parts bathed by helper: Left arm, Buttocks     Bathing assist Assist Level: Contact Guard/Touching assist     Upper Body Dressing/Undressing Upper body dressing   What is the patient wearing?: Pull over shirt    Upper body assist Assist Level: Set up assist    Lower Body Dressing/Undressing Lower body dressing      What is the patient wearing?: Incontinence brief, Pants     Lower body assist Assist for lower body dressing: Moderate Assistance - Patient 50 - 74%     Toileting Toileting Toileting Activity did not occur Landscape architect and hygiene only): Safety/medical concerns  Toileting assist Assist for toileting: Maximal Assistance - Patient 25 - 49%     Transfers Chair/bed transfer  Transfers assist  Chair/bed transfer activity did not occur: N/A  Chair/bed transfer assist level: Contact Guard/Touching assist     Locomotion Ambulation   Ambulation assist      Assist level: Moderate Assistance - Patient 50 - 74% Assistive device: (hall rail) Max distance: 25   Walk 10 feet activity   Assist     Assist level: Moderate Assistance - Patient - 50 - 74%     Walk 50 feet activity   Assist Walk 50 feet with 2 turns activity did not occur: Safety/medical concerns  Walk 150 feet activity   Assist Walk 150 feet activity did not occur: Safety/medical concerns         Walk 10 feet on uneven surface  activity   Assist Walk 10 feet on uneven surfaces activity did not occur: Safety/medical concerns         Wheelchair     Assist Will patient use wheelchair at discharge?: (TBD)   Wheelchair activity did not occur: Safety/medical concerns  Wheelchair assist level: Supervision/Verbal  cueing Max wheelchair distance: 150'    Wheelchair 50 feet with 2 turns activity    Assist    Wheelchair 50 feet with 2 turns activity did not occur: Safety/medical concerns   Assist Level: Supervision/Verbal cueing   Wheelchair 150 feet activity     Assist Wheelchair 150 feet activity did not occur: Safety/medical concerns   Assist Level: Supervision/Verbal cueing      Medical Problem List and Plan: 1.  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  Continue CIR  2.  DVT Prophylaxis/Anticoagulation: Mechanical: Sequential compression devices, below knee Bilateral lower extremities Right peroneal and posterior tibial vein acute DVT-repeat Dopplers suggesting persistent DVT without propagation, repeat dopplers again showing persistent DVT, will consider IVC filter 3. Pain Management: Tylenol prn.   Right knee pain-, MRA reviewed no significant source identified.  Appreciate vascular Recs, MRI reviewed showing Baker's cyst, lateral meniscal tear, and OA.     Voltaren gel    Knee immobilizer   Improved 4. Mood: Team to provide ego support.  LCSW to follow for evaluation and support.  5. Neuropsych: This patient is not fully capable of making decisions on her own behalf. 6. Skin/Wound Care: Monitor incision for healing.  7. Fluids/Electrolytes/Nutrition: Monitor I/Os. Offer supplements if intake poor.   BMP normal on 12/30 8. HTN: Monitor BP bid  Amlodipine decreased to 7.5 on 12/18, decreased to 5 on 12/20, decreased to 2.5 on 12/24   Vitals:   06/16/18 1956 06/17/18 0523  BP: (!) 112/97 109/83  Pulse: 72 (!) 59  Resp: 15 14  Temp: 98.3 F (36.8 C) 98.3 F (36.8 C)  SpO2: 100% 100%   Relatively controlled on 12/30 9. Moraxella HCAP: Has completed antibiotic regimen on 12/4 10. ABLA: Resolved  Hemoglobin 12.9 on 12/30  Continue to monitor 11.  Impaired fasting glucose: Likely stress related.    HbA1c 4.3 on 12/10- normal 12.  HypoK - supplement KCL  Potassium 4.4 on 12/30  Continue to monitor 13.  Spastic hemiplegia  Right lower extremity with increased hamstring tone  Tizanidine qhs DC'd on 12/19  Baclofen 5 3 times daily started on 12/19, increased to 10 mg on 12/28  Cont ROM   Educated regarding appropriate positioning 14.  Leukopenia  WBCs 3.7 on 12/30  Continue to monitor   LOS: 21 days A FACE TO FACE EVALUATION WAS PERFORMED  Ankit Lorie Phenix 06/17/2018, 8:44 AM

## 2018-06-17 NOTE — Consult Note (Signed)
Neuropsychological Consultation   Patient:   [name] [name]   DOB:   [birth year]  MR Number:  [id number]  Location:  [place] [address] CENTER [address] [place] [id number] [place] [address] Dept: [place]: [phone]           Date of Service:   06/17/2018  Start Time:   10 AM End Time:   11 AM  Provider/Observer:  Ilean Skill, Psy.D.       Clinical Neuropsychologist       Billing Code/Service: [phone] 4 Units  Chief Complaint:    [name] [name] is a 32 year old female with history of HTN, chronic allergic rhinitis, visual deficits (progressive over past few years).  Patient initially presented to [facility] with a very bad HA, neck pain, vomiting and tightness down her back.  Sent to ED and CT revealed SAH with 4x4 mm ruptured A-comm aneurysm with suspicion of hydrocephalus.  Aneurysm clipping by Dr. Kathyrn Sheriff.  Post op, patient developed right sided weakness with somnolence as well as elevated BP.  CT 11/25 showed left ACA infarct.  Patient had significant motor deficits and right sided hemiplegia.  Significant expressive aphasia.  Patient's motor functioning in right hand has improved with therapy and improving expressive language functioning but continued word finding issues.  Articulation good.    Reason for Service:  The patient was referred for neuropsychological consolation due to coping and adjustment issues following left frontal aneurysm rupture with clipping and ACA infarct.  Below is the HPI for the current admission.    HPI: [name] [name] is a 32 year old female with history of HTN, eczema, chronic allergic rhinitis, visual deficits; who was admitted via ED on 05/09/2018 with bilateral neck pain, vomiting and tightness radiating down her back to her buttocks.  History taken from chart review.  CT head done revealing SAH and CTA revealed 4 x 4 mm ruptured A-comm aneurysm with suspicion of  hydrocephalus. Cerebral angiogram with attempts at coiling not ameable therefore patient underwent Left pterional crani with aneurysm clipping by Dr. Kathyrn Sheriff. Post op, patient developed right sided weakness with somnolence as well as elevated BP requiring Cleviprex for BP control and Nimotop to manage vasospasm. CT head showed resolution of hydrocephalus and repeat CT 11/25 reviewed, showing left ACA infarct.  Per report, left ACA territory hypoattenuation consistent with early subacute ischemia.   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 She tolerated extubation without difficulty and is tolerating regular textures. She continues on nimotop thorough 12/24. Verbal output and activity tolerance improving and CIR recommended due to functional deficits.   Current Status:  Patient reports good mood now that she is able to see significant improvement in language abilities.  Patient has fully retained receptive language abilities.  Patient has severe visual deficits due to progressive vision loss over past 4 years.  Patient initially had little to no expressive language function.  She continues with word finding issues and circumlocutions but improve lexicon.  Attention good and memory without significant impairment.    Behavioral Observation: [name] [name]  presents as a 32 y.o.-year-old Right African American Female who appeared her stated age. her dress was Appropriate and she was Well Groomed and her manners were Appropriate to the situation.  her participation was indicative of Appropriate and Attentive behaviors.  There were any physical disabilities noted.  she displayed an appropriate level of cooperation and motivation.  Interactions:    Active Appropriate and Attentive  Attention:   within normal limits and attention span and concentration were age appropriate  Memory:   within normal limits; recent and remote memory intact  Visuo-spatial:  not examined   Significant pre-existing visual deficits  Speech (Volume):  low  Speech:   non-fluent aphasia; improving, with good articulation.  Thought Process:  Coherent and Relevant  Though Content:  WNL; not suicidal and not homicidal  Orientation:   person, place, time/date and situation  Judgment:   Good  Planning:   Good  Affect:    Anxious  Mood:    Dysphoric  Insight:   Good  Intelligence:   normal  Medical History:   Past Medical History:  Diagnosis Date  . Asthma, allergic    uses inhaler prn - infrequently  . Bilateral thoracic back pain 09/13/2015  . Eczema   . Hypertension   . Lactose intolerance   . Mature cystic teratoma of ovary   . Mood swings 06/01/2015  . Seasonal allergies     Psychiatric History:  Patient does have past history of "mood swings" in 06/01/2015 felt due to pregnancy.    Family Med/Psych History:  Family History  Adopted: Yes  Problem Relation Age of Onset  . Hypertension Father   . Hypertension Maternal Aunt   . Hypertension Maternal Uncle   . Hypertension Maternal Grandmother   . Eczema Maternal Grandmother   . Hypertension Maternal Grandfather   . Hypertension Paternal Grandmother   . Hypertension Mother   . Eczema Mother     Risk of Suicide/Violence: low Patient denies SI or HI.  Impression/DX:  [name] [name] is a 32 year old female with history of HTN, chronic allergic rhinitis, visual deficits (progressive over past few years).  Patient initially presented to [facility] with a very bad HA, neck pain, vomiting and tightness down her back.  Sent to ED and CT revealed SAH with 4x4 mm ruptured A-comm aneurysm with suspicion of hydrocephalus.  Aneurysm clipping by Dr. Kathyrn Sheriff.  Post op, patient developed right sided weakness with somnolence as well as elevated BP.  CT 11/25 showed left ACA infarct.  Patient had significant motor deficits and right sided hemiplegia.  Significant expressive aphasia.  Patient's motor functioning  in right hand has improved with therapy and improving expressive language functioning but continued word finding issues.  Articulation good.    Patient reports good mood now that she is able to see significant improvement in language abilities.  Patient has fully retained receptive language abilities.  Patient has severe visual deficits due to progressive vision loss over past 4 years.  Patient initially had little to no expressive language function.  She continues with word finding issues and circumlocutions but improve lexicon.  Attention good and memory without significant impairment.    Disposition/Plan:  Worked on coping and adjustment issues due to recent but improving CVA.            Electronically Signed   _______________________ Ilean Skill, Psy.D.

## 2018-06-18 ENCOUNTER — Inpatient Hospital Stay (HOSPITAL_COMMUNITY): Payer: Medicaid Other | Admitting: Physical Therapy

## 2018-06-18 ENCOUNTER — Inpatient Hospital Stay (HOSPITAL_COMMUNITY): Payer: Medicaid Other

## 2018-06-18 ENCOUNTER — Inpatient Hospital Stay (HOSPITAL_COMMUNITY): Payer: Medicaid Other | Admitting: Speech Pathology

## 2018-06-18 MED ORDER — BACLOFEN 20 MG PO TABS
20.0000 mg | ORAL_TABLET | Freq: Three times a day (TID) | ORAL | Status: DC
  Administered 2018-06-18 – 2018-06-24 (×18): 20 mg via ORAL
  Filled 2018-06-18 (×18): qty 1

## 2018-06-18 NOTE — Progress Notes (Signed)
Occupational Therapy Session Note  Patient Details  Name: [name] [name] MRN: [id number] Date of Birth: 01/24/1986  Today's Date: 06/18/2018 OT Individual Time: 3832-9191 OT Individual Time Calculation (min): 48 min    Short Term Goals: Week 3:  OT Short Term Goal 1 (Week 3): Pt will complete LB clothing management sit>stand with +1 assist in order to reduce caregiver burden OT Short Term Goal 2 (Week 3): Pt will initiate use of R UE during functional task with min cuing OT Short Term Goal 3 (Week 3): Pt will complete slide board transfer to drop arm BSC with min A  Skilled Therapeutic Interventions/Progress Updates:    Pt received sitting up in w/c with no c/o pain agreeable to therapy. Pt completed [employer] for functional reaching, focus on accuracy and motor planning ,with R UE only. Overall reaction time of 1.98 sec. Pt completed 150 ft of w/c propulsion with hemi technique with occasional steering assist. Pt transferred to therapy mat with min A. Pt completed B UE coordination task with ball, requiring min A throughout. Pt completed hitting task, bimanually holding un-weighted bar. Pt then held 3lb bowel and completed B UE strengthening circuit, focused on both NMR and strengthening. Pt returned to room and reported brief "maybe being wet". When checked, brief was indeed wet and pt was provided mod A to change at bed level. Pt left supine with all needs met, bed alarm set.   Therapy Documentation Precautions:  Precautions Precautions: Fall Precaution Comments: right hemiplegia, pushes right Restrictions Weight Bearing Restrictions: No   Pain: Pain Assessment Pain Scale: 0-10 Pain Score: 3  Pain Type: Acute pain Pain Location: Shoulder Pain Orientation: Right Pain Descriptors / Indicators: Aching Pain Onset: On-going Pain Intervention(s): Emotional support;Repositioned   Therapy/Group: Individual Therapy  Curtis Sites 06/18/2018, 11:01 AM

## 2018-06-18 NOTE — Progress Notes (Signed)
Physical Therapy Session Note  Patient Details  Name: [name] [name] MRN: [id number] Date of Birth: [birth year]  Today's Date: 06/18/2018 PT Individual Time: 1445-1535 PT Individual Time Calculation (min): 50 min   Short Term Goals: Week 4:  PT Short Term Goal 1 (Week 4): Pt will perform bed mobility with S from flat bed PT Short Term Goal 2 (Week 4): Pt will perform transfers consistent S PT Short Term Goal 3 (Week 4): Pt will ambulate 25' modA +1  Skilled Therapeutic Interventions/Progress Updates:   Pt in supine and agreeable to therapy, 2/10 R knee pain. Supervision transfer to EOB and donned KI brace w/ min assist. Min assist stand pivot transfer to w/c Pt self-propelled w/c to/from therapy gym w/ increased time. Worked on gait and pre-gait tasks w/ RW. Ambulated 20' x2 w/ CGA for standing balance, mod-max assist to advance RLE w/ shoe cap donned. Worked on R hip flexor activation while standing to kick beach ball, max manual cues for successful kicking, pt able to initiate movement 50% of the time. Returned to room and ended session in supine, all needs in reach.   Therapy Documentation Precautions:  Precautions Precautions: Fall Precaution Comments: right hemiplegia, pushes right Restrictions Weight Bearing Restrictions: No Vital Signs: Therapy Vitals Temp: 98.6 F (37 C) Temp Source: Oral Pulse Rate: 84 Resp: 17 BP: (!) 126/94 Patient Position (if appropriate): Lying Oxygen Therapy O2 Device: Room Air  Therapy/Group: Individual Therapy  [name] [name] 06/18/2018, 3:41 PM

## 2018-06-18 NOTE — Progress Notes (Signed)
Social Work Patient ID: [name] [name], female   DOB: [birth year], 32 y.o.   MRN: [id number] [place] with Mom via telephone to give her an update regarding team conference and let her know the liaison is here from the [facility]. Mom really hopes she can stay in [place] and continue her therapies. Will work on plan.

## 2018-06-18 NOTE — Progress Notes (Signed)
[place] PHYSICAL MEDICINE & REHABILITATION PROGRESS NOTE  Subjective/Complaints: Patient seen sitting up in bed this morning.  She states she slept well overnight.  She states she wore her braces overnight.  She notes visual deficits at baseline.  ROS: Denies CP, shortness of breath, nausea, vomiting, diarrhea.  Objective: Vital Signs: Blood pressure 95/78, pulse 82, temperature 98 F (36.7 C), resp. rate 16, height 5\' 10"  (1.778 m), weight 69 kg, SpO2 99 %, currently breastfeeding. No results found. Recent Labs    06/17/18 0605  WBC 3.7*  HGB 12.9  HCT 39.5  PLT 210   Recent Labs    06/17/18 0605  NA 140  K 4.4  CL 106  CO2 24  GLUCOSE 82  BUN 6  CREATININE 0.61  CALCIUM 9.4    Physical Exam: BP 95/78 (BP Location: Left Arm)   Pulse 82   Temp 98 F (36.7 C)   Resp 16   Ht 5\' 10"  (1.778 m)   Wt 69 kg   SpO2 99%   BMI 21.83 kg/m  Constitutional: No distress . Vital signs reviewed.  Obese. HENT: Normocephalic.  Atraumatic. Eyes: EOMI. No discharge. Cardiovascular: RRR.  No JVD. Respiratory: CTA bilaterally.  Normal effort. GI: BS +. Non-distended. Musc: No edema or tenderness in extremities. Neurological: She is alert Delayed processing, improving.  Right upper extremity: Proximally 1+/5, 2/5 hand grip, unchanged Right lower extremity: 0/5 proximal to distal, unchanged, unchanged Left upper extremity: 4/5 proximal distal, unchanged Left lower extremity: 4-/5 proximal distal, unchanged Knee flexor tone noted Skin: She is not diaphoretic. Crani incision c/d/i  Psychiatric: Improving affect    Assessment/Plan: 1. Functional deficits secondary to left ACA with SAH which require 3+ hours per day of interdisciplinary therapy in a comprehensive inpatient rehab setting.  Physiatrist is providing close team supervision and 24 hour management of active medical problems listed below.  Physiatrist and rehab team continue to assess barriers to discharge/monitor  patient progress toward functional and medical goals  Care Tool:  Bathing    Body parts bathed by patient: Chest, Abdomen, Right arm, Right upper leg, Left upper leg, Face, Front perineal area, Right lower leg, Left lower leg, Left arm, Buttocks   Body parts bathed by helper: Left arm, Buttocks     Bathing assist Assist Level: Contact Guard/Touching assist     Upper Body Dressing/Undressing Upper body dressing   What is the patient wearing?: Pull over shirt    Upper body assist Assist Level: Set up assist    Lower Body Dressing/Undressing Lower body dressing      What is the patient wearing?: Incontinence brief, Pants     Lower body assist Assist for lower body dressing: Moderate Assistance - Patient 50 - 74%     Toileting Toileting Toileting Activity did not occur Landscape architect and hygiene only): Safety/medical concerns  Toileting assist Assist for toileting: Maximal Assistance - Patient 25 - 49%     Transfers Chair/bed transfer  Transfers assist  Chair/bed transfer activity did not occur: N/A  Chair/bed transfer assist level: Contact Guard/Touching assist     Locomotion Ambulation   Ambulation assist      Assist level: 2 helpers Assistive device: Walker-rolling Max distance: 15   Walk 10 feet activity   Assist     Assist level: 2 helpers     Walk 50 feet activity   Assist Walk 50 feet with 2 turns activity did not occur: Safety/medical concerns  Walk 150 feet activity   Assist Walk 150 feet activity did not occur: Safety/medical concerns         Walk 10 feet on uneven surface  activity   Assist Walk 10 feet on uneven surfaces activity did not occur: Safety/medical concerns         Wheelchair     Assist Will patient use wheelchair at discharge?: (TBD)   Wheelchair activity did not occur: Safety/medical concerns  Wheelchair assist level: Supervision/Verbal cueing Max wheelchair distance: 150'     Wheelchair 50 feet with 2 turns activity    Assist    Wheelchair 50 feet with 2 turns activity did not occur: Safety/medical concerns   Assist Level: Supervision/Verbal cueing   Wheelchair 150 feet activity     Assist Wheelchair 150 feet activity did not occur: Safety/medical concerns   Assist Level: Supervision/Verbal cueing      Medical Problem List and Plan: 1.  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  Continue CIR  2.  DVT Prophylaxis/Anticoagulation: Mechanical: Sequential compression devices, below knee Bilateral lower extremities Right peroneal and posterior tibial vein acute DVT- repeat Dopplers suggesting persistent DVT without propagation, repeat dopplers again showing persistent DVT, contacted Vascular, awaiting recs 3. Pain Management: Tylenol prn.   Right knee pain-, MRA reviewed no significant source identified.  Appreciate vascular Recs, MRI reviewed showing Baker's cyst, lateral meniscal tear, and OA.     Voltaren gel    Knee immobilizer   Improved 4. Mood: Team to provide ego support.  LCSW to follow for evaluation and support.  5. Neuropsych: This patient is not fully capable of making decisions on her own behalf. 6. Skin/Wound Care: Monitor incision for healing.  7. Fluids/Electrolytes/Nutrition: Monitor I/Os. Offer supplements if intake poor.   BMP normal on 12/30 8. HTN: Monitor BP bid  Amlodipine decreased to 7.5 on 12/18, decreased to 5 on 12/20, decreased to 2.5 on 12/24   Vitals:   06/17/18 1945 06/18/18 0622  BP: (!) 153/94 95/78  Pulse: 81 82  Resp: 16 16  Temp: 99.3 F (37.4 C) 98 F (36.7 C)  SpO2: 100% 99%   Labile on 12/31 9. [place] HCAP: Has completed antibiotic regimen on 12/4 10. ABLA: Resolved  Hemoglobin 12.9 on 12/30  Continue to monitor 11.  Impaired fasting glucose: Likely stress related.   HbA1c 4.3 on 12/10- normal 12.  HypoK - supplement  KCL  Potassium 4.4 on 12/30  Continue to monitor 13.  Spastic hemiplegia  Right lower extremity with increased hamstring tone  Tizanidine qhs DC'd on 12/19  Baclofen 5 3 times daily started on 12/19, increased to 10 mg on 12/28, increased to 20 on 12/31  Cont ROM   Educated regarding appropriate positioning 14.  Leukopenia  WBCs 3.7 on 12/30  Continue to monitor   LOS: 22 days A FACE TO FACE EVALUATION WAS PERFORMED  Kalaya Infantino Lorie Phenix 06/18/2018, 8:04 AM

## 2018-06-18 NOTE — Progress Notes (Signed)
Physical Therapy Weekly Progress Note  Patient Details  Name: [name] [name] MRN: [id number] Date of Birth: 12/04/1985  Beginning of progress report period: June 11, 2018 End of progress report period: June 18, 2018  Today's Date: 06/18/2018 PT Individual Time: 0900-1000 PT Individual Time Calculation (min): 60 min   Patient has met 2 of 3 short term goals. Pt currently requires minA for bed mobility, transfers, modA for gait with standard walker, modA for 3" steps. Continues to be limited by KI on RLE and flaccid paralysis in LE however have noted emerging quad activation in RLE during stance phase of gait. Pt demonstrating improving safety awareness and motivation to participate and progress towards goals.  Patient continues to demonstrate the following deficits muscle weakness and muscle paralysis, decreased cardiorespiratoy endurance, impaired timing and sequencing, abnormal tone, unbalanced muscle activation, motor apraxia, decreased coordination and decreased motor planning, decreased visual acuity, ideational apraxia, delayed processing and decreased standing balance, decreased postural control, hemiplegia and decreased balance strategies and therefore will continue to benefit from skilled PT intervention to increase functional independence with mobility.  Patient progressing toward long term goals..  Continue plan of care.  PT Short Term Goals Week 3:  PT Short Term Goal 1 (Week 3): Pt will perform slideboard transfers consistent S PT Short Term Goal 1 - Progress (Week 3): Met PT Short Term Goal 2 (Week 3): Pt will perform bed mobility min guard PT Short Term Goal 2 - Progress (Week 3): Met PT Short Term Goal 3 (Week 3): Pt will ambulate x25' modA +1 PT Short Term Goal 3 - Progress (Week 3): Progressing toward goal Week 4:  PT Short Term Goal 1 (Week 4): Pt will perform bed mobility with S from flat bed PT Short Term Goal 2 (Week 4): Pt will perform transfers consistent  S PT Short Term Goal 3 (Week 4): Pt will ambulate 25' modA +1  Skilled Therapeutic Interventions/Progress Updates: Pt received seated in bed, denies pain and agreeable to treatment. Pt verbalizes brief needs to be changed. Rolling R/L with S and bedrails while therapist changed brief totalA. Therapist dons R TED hose, pt dons L and pants with S and increased time, cues for correct direction of pants d/t visual impairments. Long>short sit with S; pt dons pants, shoes in sitting with S; sit <>stand to pull up pants with RW, totalA for donning KI prior to standing. Stand pivot to w/c with walker and minA. W/c propulsion hemi technique. Squat pivot w/c <>flat bed in ADL apartment with min guard, min cues for technique. Sit >supine with S; increased time and multiple attempts required in order to successfully hook RLE and lift onto bed. Gait x20' with walker and modA for R swing through. Two additional trials with ace wrap dorsiflexion assist added to RLE; 25-30' per trial, minA overall. Gait trial x15' with walker and minA into room and pivot to sit in w/c. Remained seated in w/c, lap belt alarm intact and all needs in reach.      Therapy Documentation Precautions:  Precautions Precautions: Fall Precaution Comments: right hemiplegia, pushes right Restrictions Weight Bearing Restrictions: No   Therapy/Group: Individual Therapy  Elizabeth J Seigle 06/18/2018, 9:58 AM   

## 2018-06-18 NOTE — Progress Notes (Signed)
Speech Language Pathology Weekly Progress and Session Note  Patient Details  Name: [name] [name] MRN: [id number] Date of Birth: 17-Oct-1985  Beginning of progress report period: June 11, 2018 End of progress report period: June 18, 2018  Today's Date: 06/18/2018 SLP Individual Time: 1330-1400 SLP Individual Time Calculation (min): 30 min  Short Term Goals: Week 3: SLP Short Term Goal 1 (Week 3): Pt will use compensatory strategies for mildly complex word finding during functional tasks with supervision cues.  SLP Short Term Goal 1 - Progress (Week 3): Not met SLP Short Term Goal 2 (Week 3): Pt will complete basic, familiar tasks with supervision cues for functional problem solving.   SLP Short Term Goal 2 - Progress (Week 3): Not met SLP Short Term Goal 3 (Week 3): Pt will sustain her attention to basic, familiar tasks for 30 minutes with supervision cues for redirection.   SLP Short Term Goal 3 - Progress (Week 3): Met    New Short Term Goals: Week 4: SLP Short Term Goal 1 (Week 4): STGs=LTGs  Weekly Progress Updates: Patient has made functional gains and has met 1 of 3 STGs this reporting period. Currently, patient requires Min A verbal cues for use of word-finding strategies during structured naming tasks and within informal conversations. Patient also continues to require supervision-Min A verbal cues for problem solving but demonstrates increased sustained attention to tasks. Patient and family education ongoing. Patient would benefit from continued skilled SLP intervention to maximize her cognitive-linguistic function prior to discharge.      Intensity: Minumum of 1-2 x/day, 30 to 90 minutes Frequency: 3 to 5 out of 7 days Duration/Length of Stay: TBD due to SNF placement  Treatment/Interventions: Cognitive remediation/compensation;Cueing hierarchy;Environmental controls;Functional tasks;Internal/external aids;Patient/family education;Speech/Language  facilitation   Daily Session  Skilled Therapeutic Interventions: Skilled treatment session focused on communication goals. SLP facilitated session by providing Min A verbal cues for generative naming/word-finding with increased specificity (specific category and letter). Patient demonstrated selective attention to task for ~25 minutes with supervision verbal cues for redirection. Patient left upright in bed with alarm on and all needs within reach. Continue with current plan of care.         Pain No/Denies Pain   Therapy/Group: Individual Therapy  Able Malloy, [place] 06/18/2018, 4:05 PM

## 2018-06-18 NOTE — Patient Care Conference (Signed)
Inpatient RehabilitationTeam Conference and Plan of Care Update Date: 06/18/2018   Time: 9:35 AM    Patient Name: [name] [name]      Medical Record Number: [id number]  Date of Birth: January 29, 1986 Sex: Female         Room/Bed: 4M03C/4M03C-01 Payor Info: Payor: MEDICAID [place] / Plan: MEDICAID [place] ACCESS / Product Type: *No Product type* /    Admitting Diagnosis: [place] CVA  Admit Date/Time:  05/27/2018  3:36 PM Admission Comments: No comment available   Primary Diagnosis:  SAH (subarachnoid hemorrhage) (HCC) Principal Problem: SAH (subarachnoid hemorrhage) ([place])  Patient Active Problem List   Diagnosis Date Noted  . Non-fluent aphasia   . Complex tear of lateral meniscus of right knee as current injury   . Primary osteoarthritis of right knee   . Knee pain, right   . Spastic hemiplegia of right dominant side as late effect of cerebral infarction ([place])   . Leukopenia   . Acute deep vein thrombosis (DVT) of right peroneal vein   . Hypokalemia   . Nonverbal   . Essential hypertension   . Hyperglycemia   . SAH (subarachnoid hemorrhage) ([place])   . Acute cerebral infarction ([place])   . HCAP (healthcare-associated pneumonia)   . Benign essential HTN   . Acute blood loss anemia   . Right hemiplegia ([place])   . 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 Endotracheal tube present   . Brain aneurysm   . Subarachnoid hemorrhage ([place]) 05/09/2018  . Gastroesophageal reflux disease without esophagitis 10/09/2016  . BMI 31.0-31.9,adult 07/20/2016  . Obesity in pregnancy, antepartum 07/20/2016  . Tobacco abuse 06/01/2015  . Marijuana use 06/23/2014    Expected Discharge Date: Expected Discharge Date: 06/21/18  Team Members Present: Physician leading conference: Dr. 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  Improve mobility, BP, spasticity, knee pain, leukopenia, DVT  See above   Bowel/Bladder   incontinent of bowel & bladder, LBM 06/17/18  less episodes of incontinence  timed toileting   Swallow/Nutrition/ Hydration             ADL's   Min A slide board transfers, min-mod A LB bathing/dressing, min A UB bathing/dressing  Min A overall, max A toileting  ADL re-training, functional standing balance/endurance, R neuro re-ed   Mobility   minA bed mobility, transfers, modA gait/stairs  upgraded to S transfers, minA gait  R NMR, gait training, dynamic balance   Communication             Safety/Cognition/ Behavioral Observations            Pain   no c/o pain, has baclofen scheduled, tylenol & voltaren gel prn  pain scale <2/10  assess & treat as needed   Skin   no new skin break down  no new skin break down  assess q shift      *See Care Plan and progress notes for long and short-term goals.     Barriers to Discharge  Current Status/Progress Possible Resolutions Date Resolved   Physician    Medical stability;Decreased caregiver support;Lack of/limited family support;Incontinence     See above  Therapies,  follow labs, Await vascular recs      Nursing  Incontinence               PT                    OT                  SLP                SW                Discharge Planning/Teaching Needs:  Pursuing short term NHP unitl able to return home with family. FL2 sent out. Making good progress in therapies.      Team Discussion:  Making good gain in her therapies-supervision-min with transfers and ambulation Nursing still working on continence of bladder and bowel. MD adjusting BP and spascity-increasing meds. Progressing leg and using right arm.  Upgraded gait goal  Revisions to Treatment Plan:  NHP    Continued Need for Acute Rehabilitation Level of Care: The patient requires daily medical management by a physician with specialized training in physical medicine and rehabilitation for the following conditions: Daily direction of a multidisciplinary physical rehabilitation program to ensure safe treatment while eliciting the highest outcome that is of practical value to the patient.: Yes Daily medical management of patient stability for increased activity during participation in an intensive rehabilitation regime.: Yes Daily analysis of laboratory values and/or radiology reports with any subsequent need for medication adjustment of medical intervention for : Neurological problems;Blood pressure problems;Other;Post surgical problems   I attest that I was present, lead the team conference, and concur with the assessment and plan of the team.   Elease Hashimoto 06/18/2018, 10:25 AM

## 2018-06-19 ENCOUNTER — Inpatient Hospital Stay (HOSPITAL_COMMUNITY): Payer: Medicaid Other | Admitting: Physical Therapy

## 2018-06-19 ENCOUNTER — Inpatient Hospital Stay (HOSPITAL_COMMUNITY): Payer: Medicaid Other | Admitting: Speech Pathology

## 2018-06-19 ENCOUNTER — Inpatient Hospital Stay (HOSPITAL_COMMUNITY): Payer: Medicaid Other | Admitting: Occupational Therapy

## 2018-06-19 NOTE — Progress Notes (Signed)
Speech Language Pathology Daily Session Note  Patient Details  Name: [name] [name] MRN: [id number] Date of Birth: 23-Mar-1986  Today's Date: 06/19/2018 SLP Individual Time: 1030-1100 SLP Individual Time Calculation (min): 30 min  Short Term Goals: Week 4: SLP Short Term Goal 1 (Week 4): STGs=LTGs  Skilled Therapeutic Interventions: Skilled treatment session focused on speech goals. SLP facilitated session by providing Min A verbal cues for word-finding during a complex word-finding task that focused on generating synonyms. Patient left upright in wheelchair with alarm on and all needs within reach. Continue with current plan of care.      Pain No/Denies Pain  Therapy/Group: Individual Therapy  Harriet Sutphen 06/19/2018, 12:01 PM

## 2018-06-19 NOTE — Progress Notes (Signed)
Occupational Therapy Session Note  Patient Details  Name: [name] [name] MRN: [id number] Date of Birth: 10-12-1985  Today's Date: 06/19/2018 OT Individual Time: 1300-1330 OT Individual Time Calculation (min): 30 min    Short Term Goals: Week 3:  OT Short Term Goal 1 (Week 3): Pt will complete LB clothing management sit>stand with +1 assist in order to reduce caregiver burden OT Short Term Goal 2 (Week 3): Pt will initiate use of R UE during functional task with min cuing OT Short Term Goal 3 (Week 3): Pt will complete slide board transfer to drop arm BSC with min A  Skilled Therapeutic Interventions/Progress Updates:    Pt seen for OT session focusing on neuro re-ed with R UE/LE. Pt sitting up in w/c upon arrival with mother and sister present. Pt agreeable to tx session. She declined practicing toilet transfer this session opting to work on standing balance. She self propelled w/c to therapy gym with supervision using hemi-technique.  Completed min-mod A squat pivot transfer to therapy mat, setting w/c up appropriately for transfer with assist for leg rest only.  Completed sit>stand from EOM to RW with CGA. Blocking of R knee while pt completed functional reaching and matching task from standing position. Initially completed using L hand, progressing to use R hand to reach to place playing cards on board. Completed with steadying assist able to use R UE at non-dominant level mod I. Seated EOM, cont to work on functional grasp/release. Using B UEs able to toss ball with therapist from seated position with supervision. Cont with apraxic movements when completing ball toss and dropping movements with R UE. Pt returned to w/c min A squat pivot to w/c. She self propelled w/c back to room in same manner as described above. Pt left seated in w/c with family present and all needs in reach.   Therapy Documentation Precautions:  Precautions Precautions: Fall Precaution Comments: right hemiplegia,  pushes right Restrictions Weight Bearing Restrictions: No Pain:   No/denies pain   Therapy/Group: Individual Therapy  Nakita Santerre L 06/19/2018, 6:47 AM

## 2018-06-19 NOTE — Progress Notes (Signed)
PHYSICAL MEDICINE & REHABILITATION PROGRESS NOTE  Subjective/Complaints: Patient seen sitting up in bed this morning.  She states she slept well overnight.  She notes decrease in pain and improvement in strength.  ROS: Denies CP, shortness of breath, nausea, vomiting, diarrhea.  Objective: Vital Signs: Blood pressure (!) 138/110, pulse 71, temperature 98.3 F (36.8 C), resp. rate 16, height 5\' 10"  (1.778 m), weight 69 kg, SpO2 99 %, currently breastfeeding. No results found. Recent Labs    06/17/18 0605  WBC 3.7*  HGB 12.9  HCT 39.5  PLT 210   Recent Labs    06/17/18 0605  NA 140  K 4.4  CL 106  CO2 24  GLUCOSE 82  BUN 6  CREATININE 0.61  CALCIUM 9.4    Physical Exam: BP (!) 138/110 (BP Location: Left Arm)   Pulse 71   Temp 98.3 F (36.8 C)   Resp 16   Ht 5\' 10"  (1.778 m)   Wt 69 kg   SpO2 99%   BMI 21.83 kg/m  Constitutional: No distress . Vital signs reviewed.  Obese. HENT: Normocephalic.  Atraumatic. Eyes: EOMI. No discharge. Cardiovascular: RRR.  No JVD. Respiratory: CTA bilaterally.  Normal effort. GI: BS +. Non-distended. Musc: No edema or tenderness in extremities. Neurological: She is alert Delayed processing, improving.  Right upper extremity: Proximally 2+/5, 3+/5 hand grip Right lower extremity: 0/5 proximal to distal, unchanged, stable Left upper extremity: 4/5 proximal distal, unchanged Left lower extremity: 4-/5 proximal distal, unchanged Knee flexor tone noted Skin: She is not diaphoretic. Crani incision c/d/i  Psychiatric: Improving affect    Assessment/Plan: 1. Functional deficits secondary to left ACA with SAH which require 3+ hours per day of interdisciplinary therapy in a comprehensive inpatient rehab setting.  Physiatrist is providing close team supervision and 24 hour management of active medical problems listed below.  Physiatrist and rehab team continue to assess barriers to discharge/monitor patient progress toward  functional and medical goals  Care Tool:  Bathing    Body parts bathed by patient: Chest, Abdomen, Right arm, Right upper leg, Left upper leg, Face, Front perineal area, Right lower leg, Left lower leg, Left arm, Buttocks   Body parts bathed by helper: Left arm, Buttocks     Bathing assist Assist Level: Contact Guard/Touching assist     Upper Body Dressing/Undressing Upper body dressing   What is the patient wearing?: Pull over shirt    Upper body assist Assist Level: Set up assist    Lower Body Dressing/Undressing Lower body dressing      What is the patient wearing?: Incontinence brief, Pants     Lower body assist Assist for lower body dressing: Moderate Assistance - Patient 50 - 74%     Toileting Toileting Toileting Activity did not occur Landscape architect and hygiene only): Safety/medical concerns  Toileting assist Assist for toileting: Maximal Assistance - Patient 25 - 49%     Transfers Chair/bed transfer  Transfers assist  Chair/bed transfer activity did not occur: N/A  Chair/bed transfer assist level: Minimal Assistance - Patient > 75%     Locomotion Ambulation   Ambulation assist      Assist level: Moderate Assistance - Patient 50 - 74% Assistive device: Walker-rolling Max distance: 20'   Walk 10 feet activity   Assist     Assist level: Moderate Assistance - Patient - 50 - 74% Assistive device: Walker-rolling   Walk 50 feet activity   Assist Walk 50 feet with 2 turns activity did  not occur: Safety/medical concerns         Walk 150 feet activity   Assist Walk 150 feet activity did not occur: Safety/medical concerns         Walk 10 feet on uneven surface  activity   Assist Walk 10 feet on uneven surfaces activity did not occur: Safety/medical concerns         Wheelchair     Assist Will patient use wheelchair at discharge?: (TBD) Type of Wheelchair: Manual Wheelchair activity did not occur: Safety/medical  concerns  Wheelchair assist level: Supervision/Verbal cueing Max wheelchair distance: 150'    Wheelchair 50 feet with 2 turns activity    Assist    Wheelchair 50 feet with 2 turns activity did not occur: Safety/medical concerns   Assist Level: Supervision/Verbal cueing   Wheelchair 150 feet activity     Assist Wheelchair 150 feet activity did not occur: Safety/medical concerns   Assist Level: Supervision/Verbal cueing      Medical Problem List and Plan: 1.  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  Continue CIR  2.  DVT Prophylaxis/Anticoagulation: Mechanical: Sequential compression devices, below knee Bilateral lower extremities Right peroneal and posterior tibial vein acute DVT- repeat Dopplers suggesting persistent DVT without propagation, repeat dopplers again showing persistent DVT, contacted Vascular, continue to await recs 3. Pain Management: Tylenol prn.   Right knee pain-, MRA reviewed no significant source identified.  Appreciate vascular Recs, MRI reviewed showing Baker's cyst, lateral meniscal tear, and OA.     Voltaren gel    Knee immobilizer   Improved 4. Mood: Team to provide ego support.  LCSW to follow for evaluation and support.  5. Neuropsych: This patient is not fully capable of making decisions on her own behalf. 6. Skin/Wound Care: Monitor incision for healing.  7. Fluids/Electrolytes/Nutrition: Monitor I/Os. Offer supplements if intake poor.   BMP normal on 12/30 8. HTN: Monitor BP bid  Amlodipine decreased to 7.5 on 12/18, decreased to 5 on 12/20, decreased to 2.5 on 12/24   Vitals:   06/18/18 2002 06/19/18 0415  BP: (!) 141/94 (!) 138/110  Pulse: 71 71  Resp: 14 16  Temp: 98.3 F (36.8 C) 98.3 F (36.8 C)  SpO2: 100% 99%   ?  Trending up on 1/1 9. Moraxella HCAP: Has completed antibiotic regimen on 12/4 10. ABLA: Resolved  Hemoglobin 12.9 on 12/30  Continue to  monitor 11.  Impaired fasting glucose: Likely stress related.   HbA1c 4.3 on 12/10- normal 12.  HypoK - supplement KCL  Potassium 4.4 on 12/30  Continue to monitor 13.  Spastic hemiplegia  Right lower extremity with increased hamstring tone  Tizanidine qhs DC'd on 12/19  Baclofen 5 3 times daily started on 12/19, increased to 10 mg on 12/28, increased to 20 on 12/31  Cont ROM   Educated regarding appropriate positioning  Improving 14.  Leukopenia  WBCs 3.7 on 12/30  Continue to monitor   LOS: 23 days A FACE TO FACE EVALUATION WAS PERFORMED  Bear Osten Lorie Phenix 06/19/2018, 9:00 AM

## 2018-06-19 NOTE — Progress Notes (Signed)
Physical Therapy Session Note  Patient Details  Name: [name] [name] MRN: [id number] Date of Birth: December 10, 1985  Today's Date: 06/19/2018 PT Individual Time: 0930-1030 and 1415-1530 PT Individual Time Calculation (min): 60 min and 75 min (total 135 min)   Short Term Goals: Week 4:  PT Short Term Goal 1 (Week 4): Pt will perform bed mobility with S from flat bed PT Short Term Goal 2 (Week 4): Pt will perform transfers consistent S PT Short Term Goal 3 (Week 4): Pt will ambulate 25' modA +1  Skilled Therapeutic Interventions/Progress Updates: Tx 1: Pt received seated in bed, denies pain and agreeable to treatment. Pt requests to shower. Transfer bed>w/c <>drop arm BSC in shower with min guard/close S. Pt performed bathing with setupA in seated position. Transferred back to w/c to change clothing; requires minA for pulling pants over R hip, increased time for donning pants, TEDs, shoes. MinA for hooking bra, setupA for shirt. Hygiene at sink modI with increased time. W/c propulsion throughout unit L hemi technique with S. Gait x2 trials 20' with RW and minA, ace wrap dorsiflexion assist and KI RLE. Gait trials forward, around cone, back to mat to turn and sit for focus on direction changes R/L; requires increased verbal cues for technique and occasional increased assist for R foot progression/placement when turning. Handoff to SLP at end of session, all needs in reach.   Tx 2: Pt received seated in w/c, denies pain and agreeable to treatment. Sit <>stand min guard with RW to check brief. W/c propulsion throughout session with S, L hemi technique and pt attempting to use RUE however not functional and therapist cued pt to put hand back in lap for safety as pt dragging hand on wheel. Gait trial with PLS AFO RLE and toe cap; pt unable to progress LE as with ace wrap dorsiflexion assist, requires totalA for RLE placement. Discussed need for orthotic consult to determine best course of action; therapist will  pass on to next treating PT. Performed RUE/RLE NMR in sitting/supine/prone for wegiht bearing and muscle activation including PNF, AAROM. Returned to bed with min guard squat pivot, minA sit>supine. Remained in bed, alarm intact and all needs in reach.      Therapy Documentation Precautions:  Precautions Precautions: Fall Precaution Comments: right hemiplegia, pushes right Restrictions Weight Bearing Restrictions: No Pain: Pain Assessment Pain Scale: 0-10 Pain Score: 0-No pain    Therapy/Group: Individual Therapy  Corliss Skains 06/19/2018, 1:28 PM

## 2018-06-20 ENCOUNTER — Inpatient Hospital Stay (HOSPITAL_COMMUNITY): Payer: Medicaid Other | Admitting: Occupational Therapy

## 2018-06-20 ENCOUNTER — Inpatient Hospital Stay (HOSPITAL_COMMUNITY): Payer: Medicaid Other | Admitting: Speech Pathology

## 2018-06-20 ENCOUNTER — Inpatient Hospital Stay (HOSPITAL_COMMUNITY): Payer: Medicaid Other

## 2018-06-20 MED ORDER — SALINE SPRAY 0.65 % NA SOLN
1.0000 | Freq: Two times a day (BID) | NASAL | Status: DC
  Administered 2018-06-20 – 2018-06-24 (×8): 1 via NASAL
  Filled 2018-06-20: qty 44

## 2018-06-20 MED ORDER — PSEUDOEPHEDRINE HCL 30 MG PO TABS
30.0000 mg | ORAL_TABLET | Freq: Two times a day (BID) | ORAL | Status: AC
  Administered 2018-06-20 – 2018-06-21 (×4): 30 mg via ORAL
  Filled 2018-06-20 (×4): qty 1

## 2018-06-20 MED ORDER — FLUTICASONE PROPIONATE 50 MCG/ACT NA SUSP
1.0000 | Freq: Every day | NASAL | Status: DC
  Administered 2018-06-20 – 2018-06-24 (×5): 1 via NASAL
  Filled 2018-06-20: qty 16

## 2018-06-20 NOTE — Progress Notes (Signed)
Occupational Therapy Session Note  Patient Details  Name: [name] [name] MRN: [id number] Date of Birth: January 21, 1986  Today's Date: 06/20/2018 OT Individual Time: 0850-1000 and 1430-1500 OT Individual Time Calculation (min): 70 min and 30 min   Short Term Goals: Week 3:  OT Short Term Goal 1 (Week 3): Pt will complete LB clothing management sit>stand with +1 assist in order to reduce caregiver burden OT Short Term Goal 2 (Week 3): Pt will initiate use of R UE during functional task with min cuing OT Short Term Goal 3 (Week 3): Pt will complete slide board transfer to drop arm BSC with min A  Skilled Therapeutic Interventions/Progress Updates:    Session One: Pt seen for OT session focusing on ADL re-training, functional transfers and functional standing balance/endurance. . Pt sitting up in bed upon arrrival with hand off from SLP. Pt denying pain and agreeable to tx session. She declined need for bathing routine this morning, opting to dress at bed level. In  Long sitting, pt able to thread B LEs into pants and returned to supine and bridged with partial role to pull pants up with supervision. Returned to long sitting to don shoes with supervision. Practiced BSC transfers and toileting task this session. Completed min A squat pivot transfer from EOB>BSC. Pt unable to tell if brief was already soiled. Completed multiple sit>Stand to RW from [facility] with CGA, blocking of R knee as pre-caution. Brief soiled and removed. She was able to complete pericare/buttock hygiene from standing position with min- mod assist for standing balance. She was also able to manage pants up/down with min A to advance over R hip. Seated rest breaks required throughout toileting task. Pt with diffiuclty motor planing squat pivot transfer from [facility] to w/c and therefore completed stand pivot transfer with RW and min A overall with VCs and assist for management of RW. Pt able to advance L LE back with increased time and effort for  proper positioning during transfer. She completed grooming tasks from w/c level at sink, min cuing for use of R UE during functional tasks.  She self propelled w/c to therapy gym with supervision using hemi-technique. Upon getting to gym, pt voiced thinking she had had incontinent void in brief. Therefore taken back to room, stood at sink to change brief and complete peri-care/buttock hygiene. She was able to maintain static standing balance with B UE support and supervision. With min steadying assist, able to complete hygiene and clothing management.  Then addressed R UE fine motor coordination and functional use. Pt able to write name using R UE, trialed various positions and writing utensils for increased access and writing name. Pt able to legibly write name.  Pt left seated in w/c at end of session, RN present and all needs in reach.   Session Two: Pt seen for OT session focusing on functional w/c management and functional reaching task. Pt sitting up in w/c upon arrival with hand off from SLP. Pt denied pain, however, reports fatigue from day of therapies. Pt willing to participate in tx session as able. She self propelled w/c throughout unit and hospital with supervision using hemi technique. Min cuing for awareness and safety in crowded environment. She managed w/c through elevators and throughout hospital gift store. Practiced obtaining items in giftstore using R UE. Increased time and trials required for shoulder flexion to reach to obtain item from overhead.  Pt returned to room at end of session, ortho tech present to assess for AFO, education completed with  pt, he will return for session tomorrow. Pt completed stand pivot transfer back to bed at end of session. Left in long sitting awaiting nursing staff with bed rails up and bed alarm on, all needs in reach, RN made aware of need for assist following incontinence.   Therapy Documentation Precautions:  Precautions Precautions:  Fall Precaution Comments: right hemiplegia, pushes right Restrictions Weight Bearing Restrictions: No Pain:   No/denies pain   Therapy/Group: Individual Therapy  Annleigh Knueppel L 06/20/2018, 7:09 AM

## 2018-06-20 NOTE — Progress Notes (Addendum)
[place] PHYSICAL MEDICINE & REHABILITATION PROGRESS NOTE  Subjective/Complaints: Patient seen lying in bed this morning.  She states she slept well overnight.  She states she is looking forward to discharge soon.  ROS: Denies CP, shortness of breath, nausea, vomiting, diarrhea.  Objective: Vital Signs: Blood pressure 105/67, pulse 65, temperature 98.4 F (36.9 C), resp. rate 16, height 5\' 10"  (1.778 m), weight 92.8 kg, SpO2 97 %, currently breastfeeding. No results found. No results for input(s): WBC, HGB, HCT, PLT in the last 72 hours. No results for input(s): NA, K, CL, CO2, GLUCOSE, BUN, CREATININE, CALCIUM in the last 72 hours.  Physical Exam: BP 105/67 (BP Location: Left Arm)   Pulse 65   Temp 98.4 F (36.9 C)   Resp 16   Ht 5\' 10"  (1.778 m)   Wt 92.8 kg   SpO2 97%   BMI 29.36 kg/m  Constitutional: No distress . Vital signs reviewed.  Obese. HENT: Normocephalic.  Atraumatic. Eyes: EOMI. No discharge. Cardiovascular: RRR.  No JVD. Respiratory: CTA bilaterally.  Normal effort. GI: BS +. Non-distended. Musc: No edema or tenderness in extremities. Neurological: She is alert Delayed processing, improving.  Right upper extremity: Proximally 2+/5, 3+/5 hand grip Right lower extremity: 0/5 proximal to distal, unchanged, stable Left upper extremity: 4/5 proximal distal, stable Left lower extremity: 4-/5 proximal distal, stable Knee flexor tone noted Skin: She is not diaphoretic. Crani incision c/d/i  Psychiatric: Improving affect    Assessment/Plan: 1. Functional deficits secondary to left ACA with SAH which require 3+ hours per day of interdisciplinary therapy in a comprehensive inpatient rehab setting.  Physiatrist is providing close team supervision and 24 hour management of active medical problems listed below.  Physiatrist and rehab team continue to assess barriers to discharge/monitor patient progress toward functional and medical goals  Care Tool:  Bathing     Body parts bathed by patient: Chest, Abdomen, Right arm, Right upper leg, Left upper leg, Face, Front perineal area, Right lower leg, Left lower leg, Left arm, Buttocks   Body parts bathed by helper: Left arm, Buttocks     Bathing assist Assist Level: Contact Guard/Touching assist     Upper Body Dressing/Undressing Upper body dressing   What is the patient wearing?: Pull over shirt    Upper body assist Assist Level: Set up assist    Lower Body Dressing/Undressing Lower body dressing      What is the patient wearing?: Incontinence brief, Pants     Lower body assist Assist for lower body dressing: Moderate Assistance - Patient 50 - 74%     Toileting Toileting Toileting Activity did not occur Landscape architect and hygiene only): Safety/medical concerns  Toileting assist Assist for toileting: Maximal Assistance - Patient 25 - 49%     Transfers Chair/bed transfer  Transfers assist  Chair/bed transfer activity did not occur: N/A  Chair/bed transfer assist level: Minimal Assistance - Patient > 75%     Locomotion Ambulation   Ambulation assist      Assist level: Minimal Assistance - Patient > 75% Assistive device: Walker-rolling Max distance: 20   Walk 10 feet activity   Assist     Assist level: Minimal Assistance - Patient > 75% Assistive device: Walker-rolling   Walk 50 feet activity   Assist Walk 50 feet with 2 turns activity did not occur: Safety/medical concerns         Walk 150 feet activity   Assist Walk 150 feet activity did not occur: Safety/medical concerns  Walk 10 feet on uneven surface  activity   Assist Walk 10 feet on uneven surfaces activity did not occur: Safety/medical concerns         Wheelchair     Assist Will patient use wheelchair at discharge?: (TBD) Type of Wheelchair: Manual Wheelchair activity did not occur: Safety/medical concerns  Wheelchair assist level: Supervision/Verbal cueing Max  wheelchair distance: 150'    Wheelchair 50 feet with 2 turns activity    Assist    Wheelchair 50 feet with 2 turns activity did not occur: Safety/medical concerns   Assist Level: Supervision/Verbal cueing   Wheelchair 150 feet activity     Assist Wheelchair 150 feet activity did not occur: Safety/medical concerns   Assist Level: Supervision/Verbal cueing      Medical Problem List and Plan: 1.  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  Continue CIR   Plan for discharge to SNF, will see patient in 1 month post discharge for hospital follow-up 2.  DVT Prophylaxis/Anticoagulation: Mechanical: Sequential compression devices, below knee Bilateral lower extremities Right peroneal and posterior tibial vein acute DVT- repeat Dopplers suggesting persistent DVT without propagation, repeat dopplers again showing persistent DVT, contacted Vascular, continue to await recs on 1/2  Will consult VIR for IVC filter 3. Pain Management: Tylenol prn.   Right knee pain-, MRA reviewed no significant source identified.  Appreciate vascular Recs, MRI reviewed showing Baker's cyst, lateral meniscal tear, and OA.     Voltaren gel    Knee immobilizer   Improved 4. Mood: Team to provide ego support.  LCSW to follow for evaluation and support.  5. Neuropsych: This patient is not fully capable of making decisions on her own behalf. 6. Skin/Wound Care: Monitor incision for healing.  7. Fluids/Electrolytes/Nutrition: Monitor I/Os. Offer supplements if intake poor.   BMP normal on 12/30 8. HTN: Monitor BP bid  Amlodipine decreased to 7.5 on 12/18, decreased to 5 on 12/20, decreased to 2.5 on 12/24, DC'd on 1/2   Vitals:   06/19/18 2018 06/20/18 0446  BP: 104/79 105/67  Pulse: 75 65  Resp: 14 16  Temp: 98.2 F (36.8 C) 98.4 F (36.9 C)  SpO2: 100% 97%  9. Moraxella HCAP: Has completed antibiotic regimen on 12/4 10. ABLA:  Resolved  Hemoglobin 12.9 on 12/30  Continue to monitor 11.  Impaired fasting glucose: Likely stress related.   HbA1c 4.3 on 12/10- normal 12.  HypoK - supplement KCL  Potassium 4.4 on 12/30  Labs ordered for tomorrow  Continue to monitor 13.  Spastic hemiplegia  Right lower extremity with increased hamstring tone  Tizanidine qhs DC'd on 12/19  Baclofen 5 3 times daily started on 12/19, increased to 10 mg on 12/28, increased to 20 on 12/31  Cont ROM   Educated regarding appropriate positioning  Improving 14.  Leukopenia  WBCs 3.7 on 12/30  Continue to monitor   LOS: 24 days A FACE TO FACE EVALUATION WAS PERFORMED   Lorie Phenix 06/20/2018, 8:16 AM

## 2018-06-20 NOTE — Progress Notes (Signed)
Social Work Patient ID: [name] [name], female   DOB: 10-24-1985, 33 y.o.   MRN: [id number] Working on obtaining NH bed for pt. Mom to get financial information to facility. Will work toward transfer Monday, will await response from facility.

## 2018-06-20 NOTE — Progress Notes (Signed)
Occupational Therapy Session Note  Patient Details  Name: [name] [name] MRN: [id number] Date of Birth: 1985-12-23  Today's Date: 06/20/2018 OT Individual Time: 1045-1130 OT Individual Time Calculation (min): 45 min    Short Term Goals: Week 3:  OT Short Term Goal 1 (Week 3): Pt will complete LB clothing management sit>stand with +1 assist in order to reduce caregiver burden OT Short Term Goal 2 (Week 3): Pt will initiate use of R UE during functional task with min cuing OT Short Term Goal 3 (Week 3): Pt will complete slide board transfer to drop arm BSC with min A  Skilled Therapeutic Interventions/Progress Updates:    Pt seen this session for RUE NMR.  Pt received in w/c and self propelled to gym.  She completed squat pivot to mat and worked on various NMR activities to develop motor control/ coordination including bimanual and single handed activities. When pt is attending well to her hand she actually has very good motor control but if she is moving quickly that is when she has more difficulty.  Practiced using 2 hands at a time to pull a band from her ankles to thighs and back down (to simulate pulling pants) and then from thighs to waist in standing.   Worked on rotation of trunk with reaching R arm across midline and large shoulder AROM.  Pt transferred back to w/c and returned to room.  Chair belt alarm on and all needs met.    Therapy Documentation Precautions:  Precautions Precautions: Fall Precaution Comments: right hemiplegia, pushes right Restrictions Weight Bearing Restrictions: No Pain: Pain Assessment Pain Scale: 0-10 Pain Score: 0-No pain   Therapy/Group: Individual Therapy  [place] 06/20/2018, 11:59 AM

## 2018-06-20 NOTE — Progress Notes (Signed)
Speech Language Pathology Daily Make-Up Session Note  Patient Details  Name: [name] [name] MRN: [id number] Date of Birth: 06/14/1986  Today's Date: 06/20/2018 SLP Individual Time: 1400-1430 SLP Individual Time Calculation (min): 30 min  Short Term Goals: Week 4: SLP Short Term Goal 1 (Week 4): STGs=LTGs  Skilled Therapeutic Interventions: Skilled treatment session focused on cognitive goals. SLP facilitated session by providing Min A verbal cues for recall of word-finding strategies. Patient appeared frustrated throughout session and reprorted she was tired and had too much therapy today. Patient appeared passive throughout session by verbalizing "I don't care, sure" when given choices of tasks, etc. SLP provided education in regards to importance of taking ownership of her care and being her own advocate. She verbalized understanding. Patient left upright in wheelchair with all needs within reach and alarm on. Continue with current plan of care.      Pain Pain Assessment Pain Score: 0-No pain  Therapy/Group: Individual Therapy  Yordin Rhoda 06/20/2018, 3:37 PM

## 2018-06-20 NOTE — Progress Notes (Signed)
Physical Therapy Session Note  Patient Details  Name: [name] [name] MRN: [id number] Date of Birth: July 10, 1985  Today's Date: 06/20/2018 PT Individual Time: 1300-1400 PT Individual Time Calculation (min): 60 min   Short Term Goals:  Week 4:  PT Short Term Goal 1 (Week 4): Pt will perform bed mobility with S from flat bed PT Short Term Goal 2 (Week 4): Pt will perform transfers consistent S PT Short Term Goal 3 (Week 4): Pt will ambulate 25' modA +1  Skilled Therapeutic Interventions/Progress Updates:   SNF staff member and Algis Liming, PA were in room discussing d/c on  MOnday.  PT consulted with Jeannene Patella, PA requesting order for R AFO /KAFO evaluation by [facility]. PT called [facility]. Pt already wearing L KI.   W/c propulsion using hemi method x 100' with supervision.  Seated L 2 x 10 hip flexion, L long arc quad knee extension. Stand pivot transfer to mat with min assist.  Seated activity reaching out of BOS iwht R/L hands, to fatilitate trunk shortneihng/lengthening/rotating.  Pt demonstrates functional R trunk shortening, and emerging trunk lengthenjng.  Supine neuromuscular re-education via multimodal cues for L unilateral bridging, R hip abduction/adduciton; L sidelying hip flexion/extension. Trace muscle activation noted in flexors and extensors of hip with pt in L side lying.  Pt reluctant to try gait training but eventually agreed to short distance. Gait training with RW, R KI x 8' from mat to w/c iwthout ACE or AFO for R foot drop.  Min assist needed to advance RLE.Marland Kitchen  Pt handed off to Watson, SLP for next session.  Brooke Pace arrived during later OT session.  This PT consulted with him regarding RLE bracing.    Therapy Documentation Precautions:  Precautions Precautions: Fall Precaution Comments: right hemiplegia, pushes right Restrictions Weight Bearing Restrictions: No Therapy Vitals Temp: 97.8 F (36.6 C) Pulse Rate: 67 Resp: 18 BP: (!) 110/56 Patient  Position (if appropriate): Sitting Oxygen Therapy SpO2: 100 % O2 Device: Room Air Pain: Pain Assessment Pain Score: 0-No pain    Therapy/Group: Individual Therapy  Aynslee Mulhall 06/20/2018, 3:01 PM

## 2018-06-20 NOTE — Progress Notes (Signed)
Speech Language Pathology Daily Session Note  Patient Details  Name: Jaena Brocato MRN: [id number] Date of Birth: 07/16/1985  Today's Date: 06/20/2018 SLP Individual Time: 3532-9924 SLP Individual Time Calculation (min): 37 min  Short Term Goals: Week 4: SLP Short Term Goal 1 (Week 4): STGs=LTGs  Skilled Therapeutic Interventions:  Pt was seen for skilled ST targeting cognitive and communication goals.  Upon arrival, pt was seated upright in bed, awake, alert, and agreeable to participate in therapy.  SLP facilitated the session with a novel deductive reasoning puzzle to address higher level problem solving goals.  Pt needed supervision cues to complete task for 100% accuracy.  Throughout task, pt seemed to struggle most with abstract word finding to verbally rationalize and problem solve, needing min assist-supervision cues for use of compensatory strategies.  Deficits were mostly characterized by vague circumlocutory efforts to search for specific words.  SLP provided skilled education regarding compensatory word finding strategies.  Pt verbalized understanding and all questions were answered to her satisfaction at this time.  Pt was left in bed and handed off to primary OT  Continue per current plan of care.     Pain Pain Assessment Pain Scale: 0-10 Pain Score: 0-No pain  Therapy/Group: Individual Therapy  Amalya Salmons, Selinda Orion 06/20/2018, 9:05 AM

## 2018-06-21 ENCOUNTER — Inpatient Hospital Stay (HOSPITAL_COMMUNITY): Payer: Medicaid Other | Admitting: Physical Therapy

## 2018-06-21 ENCOUNTER — Inpatient Hospital Stay (HOSPITAL_COMMUNITY): Payer: Medicaid Other

## 2018-06-21 ENCOUNTER — Inpatient Hospital Stay (HOSPITAL_COMMUNITY): Payer: Medicaid Other | Admitting: Speech Pathology

## 2018-06-21 ENCOUNTER — Inpatient Hospital Stay (HOSPITAL_COMMUNITY): Payer: Medicaid Other | Admitting: Occupational Therapy

## 2018-06-21 DIAGNOSIS — K5901 Slow transit constipation: Secondary | ICD-10-CM

## 2018-06-21 LAB — BASIC METABOLIC PANEL
Anion gap: 8 (ref 5–15)
BUN: 5 mg/dL — ABNORMAL LOW (ref 6–20)
CO2: 24 mmol/L (ref 22–32)
CREATININE: 0.59 mg/dL (ref 0.44–1.00)
Calcium: 9.2 mg/dL (ref 8.9–10.3)
Chloride: 108 mmol/L (ref 98–111)
GFR calc Af Amer: >60 mL/min (ref >60)
GFR calc non Af Amer: >60 mL/min (ref >60)
Glucose, Bld: 89 mg/dL (ref 70–99)
Potassium: 3.8 mmol/L (ref 3.5–5.1)
Sodium: 140 mmol/L (ref 135–145)

## 2018-06-21 MED ORDER — POLYETHYLENE GLYCOL 3350 17 G PO PACK
17.0000 g | PACK | Freq: Two times a day (BID) | ORAL | Status: DC
  Administered 2018-06-21 – 2018-06-23 (×4): 17 g via ORAL
  Filled 2018-06-21 (×5): qty 1

## 2018-06-21 NOTE — Progress Notes (Signed)
Physical Therapy Session Note  Patient Details  Name: [name] [name] MRN: [id number] Date of Birth: Jan 11, 1986  Today's Date: 06/21/2018 PT Individual Time: 1330-1400 PT Individual Time Calculation (min): 30 min   Short Term Goals: Week 4:  PT Short Term Goal 1 (Week 4): Pt will perform bed mobility with S from flat bed PT Short Term Goal 2 (Week 4): Pt will perform transfers consistent S PT Short Term Goal 3 (Week 4): Pt will ambulate 25' modA +1  Skilled Therapeutic Interventions/Progress Updates:   Pt in w/c and agreeable to therapy, no c/o pain. Total assist w/c transport to/from therapy gym. Worked on gait training this session while trial-ing RAFO. Per PA, KI brace for comfort only and pt is safe to attempt mobility w/o brace. Ambulated 15' x4 reps in total w/ CGA-min assist. Min assist needed to advance RLE ~50% of the time. Ortho rep present and suggesting ground reaction AFO, pt able to advance limb more easily. Returned to room and ended session in w/c, all needs in reach.   Therapy Documentation Precautions:  Precautions Precautions: Fall Precaution Comments: right hemiplegia, pushes right Restrictions Weight Bearing Restrictions: No Vital Signs: Therapy Vitals Temp: 98 F (36.7 C) Pulse Rate: 75 Resp: 18 BP: 110/78 Patient Position (if appropriate): Lying Oxygen Therapy SpO2: 99 % O2 Device: Room Air Pain: Pain Assessment Pain Scale: 0-10 Pain Score: 0-No pain  Therapy/Group: Individual Therapy  Icis Budreau Clent Demark 06/21/2018, 6:08 PM

## 2018-06-21 NOTE — Progress Notes (Signed)
Occupational Therapy Session Note  Patient Details  Name: [name] [name] MRN: [id number] Date of Birth: 1985-08-05  Today's Date: 06/21/2018 OT Individual Time: 2800-3491 OT Individual Time Calculation (min): 75 min    Short Term Goals: Week 3:  OT Short Term Goal 1 (Week 3): Pt will complete LB clothing management sit>stand with +1 assist in order to reduce caregiver burden OT Short Term Goal 2 (Week 3): Pt will initiate use of R UE during functional task with min cuing OT Short Term Goal 3 (Week 3): Pt will complete slide board transfer to drop arm BSC with min A  Skilled Therapeutic Interventions/Progress Updates:    Pt seen for OT ADL bathing/dressing session. Pt sitting upright in bed upon arrival, having just finished breakfast. She was able to correctly recall and name all items she ate for breakfast with min A.  She gathered needed clothing items from duffle bag mod I in prep for dressing task. She transferred to EOB with supervision using hospital bed functions. Min A squat pivot transfer into w/c. She completed min A stand pivot transfer w/c <> BSC in shower with use of grab bar and min A. Able to stabilize R knee without assist during transfers with no episodes of knee buckling.  She bathed seated on BSC in shower with supervision, able to cross B LEs into figure four position in order to wash. She was able to use R UE to wash L LE with slightly increased time and concentration. Able to manipulate and manage shower head with R UE independently.  She transferred out of shower and dressed seated in w/c. With increased time, pt able to name all articles of needed clothing and obtain items from therapist using R UE. She stood at sink with light steadying assist while pt able to pull up pants. Pt able to control L knee without assist.  She was able to don B TED hose and socks with supervision. KI donned.  In hallway, worked on functional use of R UE to assist with w/c propulsion. Pt unable  to motor plan timing to coordination R/L UEs working in sync to push. With increased time, pt able to use R UE to propel w/c with min A overall. Pt returned to room at end of session, left seated in w/c with all needs in reach.   Therapy Documentation Precautions:  Precautions Precautions: Fall Precaution Comments: right hemiplegia, pushes right Restrictions Weight Bearing Restrictions: No Pain:   No/denies pain   Therapy/Group: Individual Therapy  Wm Sahagun L 06/21/2018, 7:07 AM

## 2018-06-21 NOTE — Progress Notes (Signed)
Speech Language Pathology Daily Session Note  Patient Details  Name: Cadance Raus MRN: [id number] Date of Birth: [birth year]  Today's Date: 06/21/2018 SLP Individual Time: 1112-1209 SLP Individual Time Calculation (min): 57 min  Short Term Goals: Week 4: SLP Short Term Goal 1 (Week 4): STGs=LTGs  Skilled Therapeutic Interventions:  Pt was seen for skilled ST targeting cognitive-linguistic goals.  Pt was received upright in wheelchair, awake, alert, and agreeable to participating in therapy.  Pt needed min cues for recall of word finding strategies from yesterday's therapy session.  SLP provided pt with a written handout of strategies to maximize carryover in between therapy sessions.  SLP also facilitated the session with a novel scheduling task targeting problem solving and verbal reasoning.  Pt needed overall min assist verbal cues to complete task for ~90% accuracy.  Pt was returned to room and left in chair with call bell within reach.  Continue per current plan of care.    Pain Pain Assessment Pain Scale: 0-10 Pain Score: 0-No pain  Therapy/Group: Individual Therapy  Art Levan, Selinda Orion 06/21/2018, 12:10 PM

## 2018-06-21 NOTE — Progress Notes (Signed)
Speech Language Pathology Discharge Summary  Patient Details  Name: [name] [name] MRN: [id number] Date of Birth: 04/07/1986  Today's Date: 06/24/2018 SLP Individual Time: 0900-0958 SLP Individual Time Calculation (min): 58 min   Skilled Therapeutic Interventions:  Skilled ST services focused on cognitive skills. SLP facilitated administration of formal cognitive linguistic assessment, Cognistat, pt scored WFL in all sections with only severe-moderate impairment in sustained attention and mild impairment in executive function/judegment. SLP facilitated recall of word finding strategies and need to continue skilled ST services to continued functional semi-complex-complex problem solving, complex word finding skills and sustained attention. All questions were answered to satisfaction. Pt was left with bed alarm set and call bell within reach.    Patient has met 4 of 4 long term goals.  Patient to discharge at overall Supervision level.  Reasons goals not met:     Clinical Impression/Discharge Summary:   Pt has made excellent gains while inpatient and is discharging  having met 4 out of 4 long term goals.  Pt is currently supervision for tasks due to mild higher level cognitive deficits and higher level word finding impairment.  Pt/family education is complete for this venue of care but would recommend additional education as part of future ST treatment sessions prior to discharge back to community.  Pt has demonstrated improved verbal expression of needs and wants, functional problem solving, safety awareness, and attention to tasks.  Pt is discharging to SNF with recommendations for ST follow up at next level of care   Care Partner:     Type of Caregiver Assistance: Cognitive;Physical  Recommendation:  Skilled Nursing facility  Rationale for SLP Follow Up: Maximize functional communication;Maximize cognitive function and independence;Reduce caregiver burden   Equipment:   N/A  Reasons for  discharge: Discharged from hospital   Patient/Family Agrees with Progress Made and Goals Achieved: Yes    MADISON  CRATCH 06/24/2018, 9:58 AM    

## 2018-06-21 NOTE — Progress Notes (Signed)
Social Work Patient ID: [name] [name], female   DOB: [birth year], 33 y.o.   MRN: [id number] Bed offer via [facility] to meet with Tammy-liaison Monday at 11:00 to complete paperwork. Will plan to transfer after lunch. Team aware and Pam-PA aware of need for discharge summary.

## 2018-06-21 NOTE — Progress Notes (Addendum)
[place] PHYSICAL MEDICINE & REHABILITATION PROGRESS NOTE  Subjective/Complaints:  No issues overnite  ROS: Denies CP, shortness of breath, nausea, vomiting, diarrhea.  Objective: Vital Signs: Blood pressure 112/87, pulse 76, temperature 98.6 F (37 C), temperature source Oral, resp. rate 18, height 5\' 10"  (1.778 m), weight 92.3 kg, SpO2 100 %, currently breastfeeding. No results found. No results for input(s): WBC, HGB, HCT, PLT in the last 72 hours. No results for input(s): NA, K, CL, CO2, GLUCOSE, BUN, CREATININE, CALCIUM in the last 72 hours.  Physical Exam: BP 112/87 (BP Location: Left Arm)   Pulse 76   Temp 98.6 F (37 C) (Oral)   Resp 18   Ht 5\' 10"  (1.778 m)   Wt 92.3 kg   SpO2 100%   BMI 29.20 kg/m  Constitutional: No distress . Vital signs reviewed.  Obese. HENT: Normocephalic.  Atraumatic. Eyes: EOMI. No discharge. Cardiovascular: RRR.  No JVD. Respiratory: CTA bilaterally.  Normal effort. GI: BS +. Non-distended. Musc: No edema or tenderness in extremities. Neurological: She is alert Delayed processing, improving.  Right upper extremity: Proximally 2+/5, 3+/5 hand grip Right lower extremity: 3/5 proximal to distal, unchanged, stable Left upper extremity: 4/5 proximal distal, stable Left lower extremity: 4-/5 proximal distal, stable Knee flexor tone noted Skin: She is not diaphoretic. Crani incision c/d/i  Psychiatric: Improving affect    Assessment/Plan: 1. Functional deficits secondary to left ACA with SAH which require 3+ hours per day of interdisciplinary therapy in a comprehensive inpatient rehab setting.  Physiatrist is providing close team supervision and 24 hour management of active medical problems listed below.  Physiatrist and rehab team continue to assess barriers to discharge/monitor patient progress toward functional and medical goals  Care Tool:  Bathing    Body parts bathed by patient: Chest, Abdomen, Right arm, Right upper leg,  Left upper leg, Face, Front perineal area, Right lower leg, Left lower leg, Left arm, Buttocks   Body parts bathed by helper: Left arm, Buttocks     Bathing assist Assist Level: Contact Guard/Touching assist     Upper Body Dressing/Undressing Upper body dressing   What is the patient wearing?: Pull over shirt    Upper body assist Assist Level: Set up assist    Lower Body Dressing/Undressing Lower body dressing      What is the patient wearing?: Incontinence brief, Pants     Lower body assist Assist for lower body dressing: Minimal Assistance - Patient > 75%     Toileting Toileting Toileting Activity did not occur (Clothing management and hygiene only): Safety/medical concerns  Toileting assist Assist for toileting: Maximal Assistance - Patient 25 - 49%     Transfers Chair/bed transfer  Transfers assist  Chair/bed transfer activity did not occur: N/A  Chair/bed transfer assist level: Minimal Assistance - Patient > 75%     Locomotion Ambulation   Ambulation assist      Assist level: Minimal Assistance - Patient > 75% Assistive device: Walker-rolling Max distance: 8   Walk 10 feet activity   Assist     Assist level: Minimal Assistance - Patient > 75% Assistive device: Walker-rolling   Walk 50 feet activity   Assist Walk 50 feet with 2 turns activity did not occur: Safety/medical concerns         Walk 150 feet activity   Assist Walk 150 feet activity did not occur: Safety/medical concerns         Walk 10 feet on uneven surface  activity   Assist  Walk 10 feet on uneven surfaces activity did not occur: Safety/medical concerns         Wheelchair     Assist Will patient use wheelchair at discharge?: (TBD) Type of Wheelchair: Manual Wheelchair activity did not occur: Safety/medical concerns  Wheelchair assist level: Supervision/Verbal cueing Max wheelchair distance: 100    Wheelchair 50 feet with 2 turns  activity    Assist    Wheelchair 50 feet with 2 turns activity did not occur: Safety/medical concerns   Assist Level: Supervision/Verbal cueing   Wheelchair 150 feet activity     Assist Wheelchair 150 feet activity did not occur: Safety/medical concerns   Assist Level: Supervision/Verbal cueing      Medical Problem List and Plan: 1.  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  Continue CIR   Plan for discharge to SNF, will see patient in 1 month post discharge for hospital follow-up 2.  DVT Prophylaxis/Anticoagulation: Mechanical: Sequential compression devices, below knee Bilateral lower extremities Right peroneal and posterior tibial vein acute DVT- repeat Dopplers suggesting persistent DVT without propagation, repeat dopplers again showing persistent DVT, contacted Vascular, continue to await recs on 1/2  Doppler unchanged no proximal DVT no new DVT 3. Pain Management: Tylenol prn.   Right knee pain-, MRA reviewed no significant source identified.  Appreciate vascular Recs, MRI reviewed showing Baker's cyst, lateral meniscal tear, and OA.     Voltaren gel    Knee immobilizer   Improved 4. Mood: Team to provide ego support.  LCSW to follow for evaluation and support.  5. Neuropsych: This patient is not fully capable of making decisions on her own behalf. 6. Skin/Wound Care: Monitor incision for healing.  7. Fluids/Electrolytes/Nutrition: Monitor I/Os. Offer supplements if intake poor.   BMP normal on 12/30 8. HTN: Monitor BP bid  Amlodipine decreased to 7.5 on 12/18, decreased to 5 on 12/20, decreased to 2.5 on 12/24, DC'd on 1/2 BP controlled 1/3   Vitals:   06/20/18 1959 06/21/18 0520  BP: 121/86 112/87  Pulse: 64 76  Resp: 18 18  Temp: 98.3 F (36.8 C) 98.6 F (37 C)  SpO2: 98% 100%  9. Moraxella HCAP: Has completed antibiotic regimen on 12/4 10. ABLA: Resolved  Hemoglobin 12.9 on  12/30  Continue to monitor 11.  Impaired fasting glucose: Likely stress related.   HbA1c 4.3 on 12/10- normal 12.  HypoK - supplement KCL  Potassium 4.4 on 12/30  Labs ordered for tomorrow  Continue to monitor 13.  Spastic hemiplegia  Right lower extremity with increased hamstring tone  Tizanidine qhs DC'd on 12/19  Baclofen 5 3 times daily started on 12/19, increased to 10 mg on 12/28, increased to 20 on 12/31  Cont ROM   Educated regarding appropriate positioning  Improving 14.  Leukopenia  WBCs 3.7 on 12/30  Continue to monitor  15.  Constipation- last BM 12/31 will check KUB to look for stool burden, abd exam benign will adjust laxative  16.  Occ urinary retention- LOS: 25 days A FACE TO [place] E [name] [name] 06/21/2018, 7:44 AM

## 2018-06-21 NOTE — Progress Notes (Signed)
Physical Therapy Session Note  Patient Details  Name: [name] [name] MRN: [id number] Date of Birth: 1985-11-02  Today's Date: 06/21/2018 PT Individual Time: 1430-1500 PT Individual Time Calculation (min): 30 min   Short Term Goals: Week 4:  PT Short Term Goal 1 (Week 4): Pt will perform bed mobility with S from flat bed PT Short Term Goal 2 (Week 4): Pt will perform transfers consistent S PT Short Term Goal 3 (Week 4): Pt will ambulate 25' modA +1  Skilled Therapeutic Interventions/Progress Updates:    Patient received in bed, very pleasant and willing to participate in therapy but reporting high levels of fatigue this afternoon. Focused on R LE NMR strategies with max tactile and verbal cues as well as functional strengthening for B LEs in supine and standing to tolerance this session. Able to complete bed mobility with min guard and extended time,  and sit to stand with RW and min guard. She was left in bed with all needs met, bed alarm active this afternoon.   Therapy Documentation Precautions:  Precautions Precautions: Fall Precaution Comments: right hemiplegia, pushes right Restrictions Weight Bearing Restrictions: No General:   Pain: Pain Assessment Pain Scale: 0-10 Pain Score: 0-No pain    Therapy/Group: Individual Therapy   Deniece Ree PT, DPT, CBIS  Supplemental Physical Therapist [facility]    Pager [phone] Acute Rehab Office [phone]   06/21/2018, 3:29 PM

## 2018-06-22 ENCOUNTER — Inpatient Hospital Stay (HOSPITAL_COMMUNITY): Payer: Medicaid Other

## 2018-06-22 ENCOUNTER — Inpatient Hospital Stay (HOSPITAL_COMMUNITY): Payer: Medicaid Other | Admitting: Occupational Therapy

## 2018-06-22 NOTE — Progress Notes (Signed)
Occupational Therapy Session Note  Patient Details  Name: [name] [name] MRN: [id number] Date of Birth: 1985/12/07  Today's Date: 06/22/2018 OT Individual Time: 3532-9924 OT Individual Time Calculation (min): 27 min   Skilled Therapeutic Interventions/Progress Updates:    Pt greeted in w/c with ADL needs met. Worked on [employer], vision, and word finding during coloring activity. Pt utilizing weak tripod grasp to color with Rt hand. She needed to hold paper up to face to distinguish what she was coloring, but able to correctly state objects seen in  coloring page with 100% accuracy (I.e. dragonfly or square). When asked what she could see, pt stated "basically nothing" and that it's "hit or miss." Discussed coloring with children at home to continue working on vision, relaxation, and Rt NMR. At end of session pt left with all needs, continuing with coloring.   Therapy Documentation Precautions:  Precautions Precautions: Fall Precaution Comments: right hemiplegia, pushes right Restrictions Weight Bearing Restrictions: No Pain: No c/o pain during session    ADL:        Therapy/Group: Individual Therapy  Darrius Montano A Sherilee Smotherman 06/22/2018, 12:52 PM

## 2018-06-22 NOTE — Progress Notes (Signed)
[place] PHYSICAL MEDICINE & REHABILITATION PROGRESS NOTE  Subjective/Complaints:  Received enema this am no result thus far  Discussed occ cath, pt states she doesn't feel full , also states she voids better in brief than on commode  ROS: Denies CP, shortness of breath, nausea, vomiting, diarrhea.  Objective: Vital Signs: Blood pressure 116/84, pulse 71, temperature 98.5 F (36.9 C), temperature source Oral, resp. rate 18, height 5\' 10"  (1.778 m), weight 91.5 kg, SpO2 99 %, currently breastfeeding. Dg Abd 1 View  Result Date: 06/21/2018 CLINICAL DATA:  Constipation EXAM: ABDOMEN - 1 VIEW COMPARISON:  None. FINDINGS: Scattered large and small bowel gas is noted. Fecal material is noted throughout the colon consistent with a degree of constipation. No obstructive changes are seen. No free intraperitoneal air is noted. No bony abnormality is seen. IMPRESSION: Changes of constipation with significant retained fecal material within the colon. Electronically Signed   By: Inez Catalina M.D.   On: 06/21/2018 11:03   No results for input(s): WBC, HGB, HCT, PLT in the last 72 hours. Recent Labs    06/21/18 0721  NA 140  K 3.8  CL 108  CO2 24  GLUCOSE 89  BUN 5*  CREATININE 0.59  CALCIUM 9.2    Physical Exam: BP 116/84 (BP Location: Left Arm)   Pulse 71   Temp 98.5 F (36.9 C) (Oral)   Resp 18   Ht 5\' 10"  (1.778 m)   Wt 91.5 kg   SpO2 99%   BMI 28.94 kg/m  Constitutional: No distress . Vital signs reviewed.  Obese. HENT: Normocephalic.  Atraumatic. Eyes: EOMI. No discharge. Cardiovascular: RRR.  No JVD. Respiratory: CTA bilaterally.  Normal effort. GI: BS +. Non-distended. Musc: No edema or tenderness in extremities. Neurological: She is alert Delayed processing, improving.  Right upper extremity: Proximally 2+/5, 3+/5 hand grip Right lower extremity: 3/5 proximal to distal, unchanged, stable Left upper extremity: 4/5 proximal distal, stable Left lower extremity: 4-/5  proximal distal, stable Knee flexor tone noted Skin: She is not diaphoretic. Crani incision c/d/i  Psychiatric: Improving affect    Assessment/Plan: 1. Functional deficits secondary to left ACA with SAH which require 3+ hours per day of interdisciplinary therapy in a comprehensive inpatient rehab setting.  Physiatrist is providing close team supervision and 24 hour management of active medical problems listed below.  Physiatrist and rehab team continue to assess barriers to discharge/monitor patient progress toward functional and medical goals  Care Tool:  Bathing    Body parts bathed by patient: Chest, Abdomen, Right arm, Right upper leg, Left upper leg, Face, Front perineal area, Right lower leg, Left lower leg, Left arm, Buttocks   Body parts bathed by helper: Left arm, Buttocks     Bathing assist Assist Level: Supervision/Verbal cueing     Upper Body Dressing/Undressing Upper body dressing   What is the patient wearing?: Pull over shirt, Bra    Upper body assist Assist Level: Independent    Lower Body Dressing/Undressing Lower body dressing      What is the patient wearing?: Incontinence brief, Pants     Lower body assist Assist for lower body dressing: Minimal Assistance - Patient > 75%     Toileting Toileting Toileting Activity did not occur Landscape architect and hygiene only): Safety/medical concerns  Toileting assist Assist for toileting: Maximal Assistance - Patient 25 - 49%     Transfers Chair/bed transfer  Transfers assist  Chair/bed transfer activity did not occur: N/A  Chair/bed transfer assist level:  Minimal Assistance - Patient > 75%     Locomotion Ambulation   Ambulation assist      Assist level: Contact Guard/Touching assist Assistive device: Walker-rolling Max distance: 15'   Walk 10 feet activity   Assist     Assist level: Contact Guard/Touching assist Assistive device: Walker-rolling   Walk 50 feet activity   Assist  Walk 50 feet with 2 turns activity did not occur: Safety/medical concerns         Walk 150 feet activity   Assist Walk 150 feet activity did not occur: Safety/medical concerns         Walk 10 feet on uneven surface  activity   Assist Walk 10 feet on uneven surfaces activity did not occur: Safety/medical concerns         Wheelchair     Assist Will patient use wheelchair at discharge?: (TBD) Type of Wheelchair: Manual Wheelchair activity did not occur: Safety/medical concerns  Wheelchair assist level: Supervision/Verbal cueing Max wheelchair distance: 100    Wheelchair 50 feet with 2 turns activity    Assist    Wheelchair 50 feet with 2 turns activity did not occur: Safety/medical concerns   Assist Level: Supervision/Verbal cueing   Wheelchair 150 feet activity     Assist Wheelchair 150 feet activity did not occur: Safety/medical concerns   Assist Level: Supervision/Verbal cueing      Medical Problem List and Plan: 1.  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  Continue CIR   Plan for discharge to SNF, will see patient in 1 month post discharge for hospital follow-up 2.  DVT Prophylaxis/Anticoagulation: Mechanical: Sequential compression devices, below knee Bilateral lower extremities Right peroneal and posterior tibial vein acute DVT- repeat Dopplers suggesting persistent DVT without propagation, repeat dopplers again showing persistent DVT, Doppler unchanged no proximal DVT no new DVT- no LE swelling or pain, no IVC needed at this point Mobility improving 3. Pain Management: Tylenol prn.   Right knee pain-, MRA reviewed no significant source identified.  Appreciate vascular Recs, MRI reviewed showing Baker's cyst, lateral meniscal tear, and OA.     Voltaren gel    Knee immobilizer   Improved 4. Mood: Team to provide ego support.  LCSW to follow for evaluation and support.   5. Neuropsych: This patient is not fully capable of making decisions on her own behalf. 6. Skin/Wound Care: Monitor incision for healing.  7. Fluids/Electrolytes/Nutrition: Monitor I/Os. Offer supplements if intake poor.   BMP normal on 12/30 8. HTN: Monitor BP bid  Amlodipine decreased to 7.5 on 12/18, decreased to 5 on 12/20, decreased to 2.5 on 12/24, DC'd on 1/2 BP controlled 1/4   Vitals:   06/21/18 2014 06/22/18 0543  BP: (!) 130/91 116/84  Pulse: 69 71  Resp: 16 18  Temp: 98.8 F (37.1 C) 98.5 F (36.9 C)  SpO2: 99% 99%  9. Moraxella HCAP: Has completed antibiotic regimen on 12/4 10. ABLA: Resolved  Hemoglobin 12.9 on 12/30  Continue to monitor 11.  Impaired fasting glucose: Likely stress related.   HbA1c 4.3 on 12/10- normal 12.  HypoK - supplement KCL  Potassium 4.4 on 12/30  Labs ordered for tomorrow  Continue to monitor 13.  Spastic hemiplegia  Right lower extremity with increased hamstring tone  Tizanidine qhs DC'd on 12/19  Baclofen 5 3 times daily started on 12/19, increased to 10 mg on 12/28, increased to 20 on 12/31  Cont ROM   Educated  regarding appropriate positioning  Improving 14.  Leukopenia  WBCs 3.7 on 12/30  Continue to monitor  15.  Constipation- last BM 12/31 will check KUB to look for stool burden, abd exam benign will adjust laxative- may need further adjustment if no BM  16.  Occ urinary retention-occ ICP, do not think pt will need foley on D/C LOS: 26 days A FACE TO FACE EVALUATION WAS PERFORMED  Charlett Blake 06/22/2018, 7:17 AM

## 2018-06-22 NOTE — Progress Notes (Signed)
Physical Therapy Session Note  Patient Details  Name: [name] MRN: [id number] Date of Birth: March 02, 1986  Today's Date: 06/22/2018 PT Individual Time: 0902-1000 PT Individual Time Calculation (min): 58 min   Short Term Goals: Week 4:  PT Short Term Goal 1 (Week 4): Pt will perform bed mobility with S from flat bed PT Short Term Goal 2 (Week 4): Pt will perform transfers consistent S PT Short Term Goal 3 (Week 4): Pt will ambulate 25' modA +1  Skilled Therapeutic Interventions/Progress Updates:    Pt supine in bed upon PT arrival, agreeable to therapy tx and denies pain at rest. Pt incontinent of bowel this session, performed rolling this session with supervision while therapist performed peri care and donned clean brief. Pt performed upper and lower body dressing, donned teds and shoes all with min assist. Pt performed stand pivot to w/c with min assist and propelled to gym with supervision. Pt ambulated x 10 ft and x 20 ft with RW and R AFO, mod assist with manual facilitation for hip/knee extension during R stance, manual facilitation for R hip flexion during swing. Pt performed x 5 sit<>stands with min assist, 2 x 10 mini squats with emphasis on symmetric LE weightbearing. Pt transferred back to w/c with min assist and transported back to room, left seated with needs in reach.   Therapy Documentation Precautions:  Precautions Precautions: Fall Precaution Comments: right hemiplegia, pushes right Restrictions Weight Bearing Restrictions: (P) No    Therapy/Group: Individual Therapy  Netta Corrigan, PT, DPT 06/22/2018, 7:54 AM

## 2018-06-23 ENCOUNTER — Inpatient Hospital Stay (HOSPITAL_COMMUNITY): Payer: Medicaid Other

## 2018-06-23 NOTE — Progress Notes (Signed)
[place] PHYSICAL MEDICINE & REHABILITATION PROGRESS NOTE  Subjective/Complaints:  BM x2 recorded yesterday  ROS: Denies CP, shortness of breath, nausea, vomiting, diarrhea.  Objective: Vital Signs: Blood pressure 107/78, pulse 71, temperature 98.7 F (37.1 C), temperature source Oral, resp. rate 18, height 5\' 10"  (1.778 m), weight 89.6 kg, SpO2 99 %, currently breastfeeding. Dg Abd 1 View  Result Date: 06/21/2018 CLINICAL DATA:  Constipation EXAM: ABDOMEN - 1 VIEW COMPARISON:  None. FINDINGS: Scattered large and small bowel gas is noted. Fecal material is noted throughout the colon consistent with a degree of constipation. No obstructive changes are seen. No free intraperitoneal air is noted. No bony abnormality is seen. IMPRESSION: Changes of constipation with significant retained fecal material within the colon. Electronically Signed   By: Inez Catalina M.D.   On: 06/21/2018 11:03   No results for input(s): WBC, HGB, HCT, PLT in the last 72 hours. Recent Labs    06/21/18 0721  NA 140  K 3.8  CL 108  CO2 24  GLUCOSE 89  BUN 5*  CREATININE 0.59  CALCIUM 9.2    Physical Exam: BP 107/78 (BP Location: Left Arm)   Pulse 71   Temp 98.7 F (37.1 C) (Oral)   Resp 18   Ht 5\' 10"  (1.778 m)   Wt 89.6 kg   SpO2 99%   BMI 28.34 kg/m  Constitutional: No distress . Vital signs reviewed.  Obese. HENT: Normocephalic.  Atraumatic. Eyes: EOMI. No discharge. Cardiovascular: RRR.  No JVD. Respiratory: CTA bilaterally.  Normal effort. GI: BS +. Non-distended. Musc: No edema or tenderness in extremities. Neurological: She is alert Delayed processing, improving.  Right upper extremity: Proximally 2+/5, 3+/5 hand grip Right lower extremity: 3/5 proximal to distal, unchanged, stable Left upper extremity: 4/5 proximal distal, stable Left lower extremity: 4-/5 proximal distal, stable Knee flexor tone noted Skin: She is not diaphoretic. Crani incision c/d/i  Psychiatric: Improving  affect    Assessment/Plan: 1. Functional deficits secondary to left ACA with SAH which require 3+ hours per day of interdisciplinary therapy in a comprehensive inpatient rehab setting.  Physiatrist is providing close team supervision and 24 hour management of active medical problems listed below.  Physiatrist and rehab team continue to assess barriers to discharge/monitor patient progress toward functional and medical goals  Care Tool:  Bathing    Body parts bathed by patient: Chest, Abdomen, Right arm, Right upper leg, Left upper leg, Face, Front perineal area, Right lower leg, Left lower leg, Left arm, Buttocks   Body parts bathed by helper: Left arm, Buttocks     Bathing assist Assist Level: Supervision/Verbal cueing     Upper Body Dressing/Undressing Upper body dressing   What is the patient wearing?: Pull over shirt, Bra    Upper body assist Assist Level: Independent    Lower Body Dressing/Undressing Lower body dressing      What is the patient wearing?: Incontinence brief, Pants     Lower body assist Assist for lower body dressing: Minimal Assistance - Patient > 75%     Toileting Toileting Toileting Activity did not occur Landscape architect and hygiene only): Safety/medical concerns  Toileting assist Assist for toileting: Maximal Assistance - Patient 25 - 49%     Transfers Chair/bed transfer  Transfers assist  Chair/bed transfer activity did not occur: N/A  Chair/bed transfer assist level: Minimal Assistance - Patient > 75%     Locomotion Ambulation   Ambulation assist      Assist level: Moderate Assistance -  Patient 50 - 74% Assistive device: Walker-rolling Max distance: 20 ft   Walk 10 feet activity   Assist     Assist level: Moderate Assistance - Patient - 50 - 74% Assistive device: Walker-rolling   Walk 50 feet activity   Assist Walk 50 feet with 2 turns activity did not occur: Safety/medical concerns         Walk 150 feet  activity   Assist Walk 150 feet activity did not occur: Safety/medical concerns         Walk 10 feet on uneven surface  activity   Assist Walk 10 feet on uneven surfaces activity did not occur: Safety/medical concerns         Wheelchair     Assist Will patient use wheelchair at discharge?: (TBD) Type of Wheelchair: Manual Wheelchair activity did not occur: Safety/medical concerns  Wheelchair assist level: Supervision/Verbal cueing Max wheelchair distance: 100    Wheelchair 50 feet with 2 turns activity    Assist    Wheelchair 50 feet with 2 turns activity did not occur: Safety/medical concerns   Assist Level: Supervision/Verbal cueing   Wheelchair 150 feet activity     Assist Wheelchair 150 feet activity did not occur: Safety/medical concerns   Assist Level: Supervision/Verbal cueing      Medical Problem List and Plan: 1.  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  Continue CIR   Plan for discharge to SNF, ?1/6  2.  DVT Prophylaxis/Anticoagulation: Mechanical: Sequential compression devices, below knee Bilateral lower extremities Right peroneal and posterior tibial vein acute DVT- repeat Dopplers suggesting persistent DVT without propagation, repeat dopplers again showing persistent DVT, Doppler unchanged no proximal DVT no new DVT- no LE swelling or pain, no IVC needed at this point Mobility improving 3. Pain Management: Tylenol prn.   Right knee pain-, MRA reviewed no significant source identified.  Appreciate vascular Recs, MRI reviewed showing Baker's cyst, lateral meniscal tear, and OA.     Voltaren gel    Knee immobilizer   Improved 4. Mood: Team to provide ego support.  LCSW to follow for evaluation and support.  5. Neuropsych: This patient is not fully capable of making decisions on her own behalf. 6. Skin/Wound Care: Monitor incision for healing.  7.  Fluids/Electrolytes/Nutrition: Monitor I/Os. Offer supplements if intake poor.   BMP normal on 12/30 8. HTN: Monitor BP bid  Amlodipine decreased to 7.5 on 12/18, decreased to 5 on 12/20, decreased to 2.5 on 12/24, DC'd on 1/2 BP controlled 1/5   Vitals:   06/22/18 1952 06/23/18 0509  BP: (!) 127/96 107/78  Pulse: 71 71  Resp: 18 18  Temp: 98.6 F (37 C) 98.7 F (37.1 C)  SpO2: 100% 99%  9. Moraxella HCAP: Has completed antibiotic regimen on 12/4 10. ABLA: Resolved  Hemoglobin 12.9 on 12/30  Continue to monitor 11.  Impaired fasting glucose: Likely stress related.   HbA1c 4.3 on 12/10- normal 12.  HypoK - supplement KCL  Potassium 4.4 on 12/30  Labs ordered for tomorrow  Continue to monitor 13.  Spastic hemiplegia  Right lower extremity with increased hamstring tone  Tizanidine qhs DC'd on 12/19  Baclofen 5 3 times daily started on 12/19, increased to 10 mg on 12/28, increased to 20 on 12/31- tone has improved  Cont ROM   Educated regarding appropriate positioning   14.  Leukopenia  WBCs 3.7 on 12/30  Continue to monitor  15.  Constipation-improved with  laxative adjustment  16.  Occ urinary retention-occ ICP, do not think pt will need foley on D/C, PVR <337ml LOS: 27 days A FACE TO FACE EVALUATION WAS PERFORMED  Charlett Blake 06/23/2018, 6:51 AM

## 2018-06-23 NOTE — Progress Notes (Signed)
Physical Therapy Discharge Summary  Patient Details  Name: [name] [name] MRN: [id number] Date of Birth: 06/28/1985  Today's Date: 06/23/2018 PT Individual Time: 0900-0956 PT Individual Time Calculation (min): 56 min    Patient has met 10 of 10 long term goals due to improved activity tolerance, improved balance, improved postural control, increased strength, ability to compensate for deficits and improved attention.  Patient to discharge at an ambulatory level [place], wheelchair level supervision.   Patient's care partner unavailable to provide the necessary physical assistance at discharge, therefore pt will be discharging to SNF.  All goals met.   Recommendation:  Patient will benefit from ongoing skilled PT services in skilled nursing facility setting to continue to advance safe functional mobility, address ongoing impairments in balance, strength, mobility, and minimize fall risk.  Equipment: No equipment provided  Reasons for discharge: treatment goals met  Patient/family agrees with progress made and goals achieved: Yes   PT Treatment Interventions: Pt seated in bed upon PT arrival, agreeable to therapy tx and denies pain. Pt in long sitting performs all upper and lower body dressing with supervision. Pt transferred to sitting EOB and dons shoes with supervision, min assist for R AFO. Pt performed sit<>stand with RW and supervision to pull pants over hips. Pt performed stand pivot to w/c with supervision and propelled w/c to gym x 150 ft with supervision. Pt performed squat pivot car transfer with supervision and verbal cues for safety. Pt propelled w/c to gym with supervision x 200 ft. Pt ambulated within the gym x 50 ft with RW and min assist, occasional assist to advance R LE during swing. Pt ascended/descended 4 steps this session with mod assist and B rails, therapist blocking R knee and assisting to advance R LE. Pt propelled back to room with supervision and left seated in  w/c with needs in reach.   PT Discharge Precautions/Restrictions Precautions Precautions: Fall Precaution Comments: right hemiplegia Restrictions Weight Bearing Restrictions: No Pain   denies pain Cognition Overall Cognitive Status: Impaired/Different from baseline Arousal/Alertness: Awake/alert Orientation Level: Oriented X4 Focused Attention: Appears intact Sustained Attention: Impaired Safety/Judgment: Impaired Sensation Sensation Light Touch: Impaired Detail Light Touch Impaired Details: Impaired RLE;Absent RUE Coordination Gross Motor Movements are Fluid and Coordinated: No Fine Motor Movements are Fluid and Coordinated: No Coordination and Movement Description: R hemiparesis Motor  Motor Motor: Hemiplegia;Abnormal postural alignment and control Motor - Discharge Observations: R hemipresis  Mobility Bed Mobility Bed Mobility: Supine to Sit;Sit to Supine;Rolling Right;Rolling Left Rolling Right: Supervision/verbal cueing Rolling Left: Supervision/Verbal cueing Supine to Sit: Supervision/Verbal cueing Sit to Supine: Supervision/Verbal cueing Transfers Transfers: Squat Pivot Transfers;Stand Pivot Transfers Sit to Stand: Contact Guard/Touching assist;Supervision/Verbal cueing Stand Pivot Transfers: Contact Guard/Touching assist Squat Pivot Transfers: Supervision/Verbal cueing Locomotion  Gait Ambulation: Yes Gait Assistance: Minimal Assistance - Patient > 75% Gait Distance (Feet): 50 Feet Assistive device: Rolling walker Gait Assistance Details: Verbal cues for precautions/safety;Verbal cues for technique;Manual facilitation for placement Gait Gait: Yes Gait Pattern: Impaired Gait Pattern: Decreased step length - right;Step-to pattern Stairs / Additional Locomotion Stairs: Yes Stair Management Technique: Two rails Number of Stairs: 4 Height of Stairs: 6 Ramp: Moderate Assistance - Patient 50 - 74% Wheelchair Mobility Wheelchair Mobility: Yes Wheelchair  Assistance: Chartered loss adjuster: Both upper extremities;Left lower extremity Wheelchair Parts Management: Supervision/cueing  Trunk/Postural Assessment  Cervical Assessment Cervical Assessment: Within Functional Limits Thoracic Assessment Thoracic Assessment: Within Functional Limits Lumbar Assessment Lumbar Assessment: Within Functional Limits Postural Control Postural Control: Deficits on evaluation  Balance Balance Balance Assessed: Yes Static Sitting Balance Static Sitting - Level of Assistance: 5: Stand by assistance Dynamic Sitting Balance Dynamic Sitting - Level of Assistance: 5: Stand by assistance Static Standing Balance Static Standing - Level of Assistance: 5: Stand by assistance;4: Min assist Dynamic Standing Balance Dynamic Standing - Level of Assistance: 5: Stand by assistance;4: Min assist Extremity Assessment  RLE Assessment RLE Assessment: Exceptions to [facility] Passive Range of Motion (PROM) Comments: [facility] Strength Comments: strength 2+/5 at hip seen functionally, 2-/5 quadriceps seen functionally, 0/5 hamstrings/calf/anterior tib LLE Assessment LLE Assessment: Within Functional Limits    Netta Corrigan, PT, DPT 06/23/2018, 9:58 AM

## 2018-06-24 ENCOUNTER — Inpatient Hospital Stay (HOSPITAL_COMMUNITY): Payer: Medicaid Other | Admitting: Occupational Therapy

## 2018-06-24 ENCOUNTER — Inpatient Hospital Stay (HOSPITAL_COMMUNITY): Payer: Medicaid Other

## 2018-06-24 LAB — BASIC METABOLIC PANEL
Anion gap: 8 (ref 5–15)
BUN: 8 mg/dL (ref 6–20)
CO2: 23 mmol/L (ref 22–32)
Calcium: 9.2 mg/dL (ref 8.9–10.3)
Chloride: 107 mmol/L (ref 98–111)
Creatinine, Ser: 0.65 mg/dL (ref 0.44–1.00)
GFR calc Af Amer: >60 mL/min (ref >60)
GFR calc non Af Amer: >60 mL/min (ref >60)
Glucose, Bld: 90 mg/dL (ref 70–99)
Potassium: 4.1 mmol/L (ref 3.5–5.1)
Sodium: 138 mmol/L (ref 135–145)

## 2018-06-24 LAB — CBC
HCT: 40.3 % (ref 36.0–46.0)
Hemoglobin: 12.8 g/dL (ref 12.0–15.0)
MCH: 31 pg (ref 26.0–34.0)
MCHC: 31.8 g/dL (ref 30.0–36.0)
MCV: 97.6 fL (ref 80.0–100.0)
Platelets: 191 10*3/uL (ref 150–400)
RBC: 4.13 MIL/uL (ref 3.87–5.11)
RDW: 12.1 % (ref 11.5–15.5)
WBC: 3.4 10*3/uL — ABNORMAL LOW (ref 4.0–10.5)
nRBC: 0 % (ref 0.0–0.2)

## 2018-06-24 MED ORDER — ADULT MULTIVITAMIN W/MINERALS CH: 1.0000 | ORAL_TABLET | Freq: Every day | ORAL | Status: AC

## 2018-06-24 MED ORDER — FAMOTIDINE 20 MG PO TABS: 20.0000 mg | ORAL_TABLET | Freq: Two times a day (BID) | ORAL | Status: DC

## 2018-06-24 MED ORDER — ACETAMINOPHEN 325 MG PO TABS: 325.0000 mg | ORAL_TABLET | ORAL | Status: AC | PRN

## 2018-06-24 MED ORDER — SALINE SPRAY 0.65 % NA SOLN: 1.0000 | Freq: Two times a day (BID) | NASAL | 0 refills | Status: DC

## 2018-06-24 MED ORDER — SENNOSIDES-DOCUSATE SODIUM 8.6-50 MG PO TABS: 2.0000 | ORAL_TABLET | Freq: Two times a day (BID) | ORAL | Status: DC

## 2018-06-24 MED ORDER — BISACODYL 10 MG RE SUPP: 10.0000 mg | Freq: Every day | RECTAL | 0 refills | Status: DC | PRN

## 2018-06-24 MED ORDER — ENSURE ENLIVE PO LIQD: 237.0000 mL | Freq: Two times a day (BID) | ORAL | 12 refills | Status: DC

## 2018-06-24 MED ORDER — BACLOFEN 20 MG PO TABS: 20.0000 mg | ORAL_TABLET | Freq: Three times a day (TID) | ORAL | 0 refills | Status: DC

## 2018-06-24 MED ORDER — FLUTICASONE PROPIONATE 50 MCG/ACT NA SUSP: 1.0000 | Freq: Every day | NASAL | 2 refills | Status: DC

## 2018-06-24 MED ORDER — FLEET ENEMA 7-19 GM/118ML RE ENEM
1.0000 | ENEMA | Freq: Every day | RECTAL | Status: DC | PRN
  Administered 2018-06-24: 1 via RECTAL
  Filled 2018-06-24: qty 1

## 2018-06-24 NOTE — Progress Notes (Signed)
Positive results from enema xlarge brown BM

## 2018-06-24 NOTE — Progress Notes (Signed)
Please see discharge summary as well.  Plan for discharge to SNF today. Per discussion with [facility] no plans for IVC filter at present. Continue to follow DVT with follow-up scan.

## 2018-06-24 NOTE — Progress Notes (Signed)
Occupational Therapy Discharge Summary  Patient Details  Name: [name] [name] MRN: [id number] Date of Birth: 1985-12-25   Patient has met 61 of 11 long term goals due to improved activity tolerance, improved balance, postural control, ability to compensate for deficits, functional use of  RIGHT upper and RIGHT lower extremity, improved attention, improved awareness and improved coordination.  Patient to discharge at [facility] Assist level.  Patient's family is unable to provide the physical and cognitive assist she will require at d/c and therefore pt to d/c to SNF in order to cont to increase independence with ADLs and reduce caregiver burden.  Pt is completing ADL tasks at overall steadying assist with increased time. Functional transfers via stand pivot or squat pivot with CGAShe cont to be most limited by apraxia and weakness in R UE/LE. However, with increased time, is able to use R UE at gross assist- non-dominant level.   Recommendation:  Patient will benefit from ongoing skilled OT services in skilled nursing facility setting to continue to advance functional skills in the area of BADL and Reduce care partner burden.  Equipment: To be determined at next venue of care.   Reasons for discharge: treatment goals met and discharge from hospital  Patient/family agrees with progress made and goals achieved: Yes  OT Discharge Precautions/Restrictions  Precautions Precautions: Fall Precaution Comments: right hemiplegia Restrictions Weight Bearing Restrictions: No Vision Baseline Vision/History: Legally blind Patient Visual Report: No change from baseline Vision Assessment?: No apparent visual deficits Perception  Perception: Within Functional Limits Praxis Praxis: Impaired Praxis Impairment Details: Motor planning;Initiation Cognition Overall Cognitive Status: Impaired/Different from baseline Arousal/Alertness: Awake/alert Orientation Level: Oriented X4 Attention:  Sustained Focused Attention: Appears intact Sustained Attention: Impaired Sustained Attention Impairment: Functional complex;Verbal complex Memory: Appears intact Awareness: Appears intact Problem Solving: Impaired Problem Solving Impairment: Functional complex;Verbal complex Initiating: Impaired Initiating Impairment: Verbal basic;Functional basic Safety/Judgment: Appears intact Sensation Sensation Light Touch: Impaired Detail Light Touch Impaired Details: Impaired RUE;Impaired RLE Proprioception: Impaired Detail Proprioception Impaired Details: Absent RUE;Impaired RLE Coordination Gross Motor Movements are Fluid and Coordinated: No Fine Motor Movements are Fluid and Coordinated: No Coordination and Movement Description: R hemiparesis Motor  Motor Motor: Hemiplegia;Abnormal postural alignment and control Motor - Discharge Observations: R hemipresis Trunk/Postural Assessment  Cervical Assessment Cervical Assessment: Within Functional Limits Thoracic Assessment Thoracic Assessment: Within Functional Limits Lumbar Assessment Lumbar Assessment: Within Functional Limits Postural Control Postural Control: Deficits on evaluation  Balance Balance Balance Assessed: Yes Static Sitting Balance Static Sitting - Balance Support: Feet supported;No upper extremity supported Static Sitting - Level of Assistance: 6: Modified independent (Device/Increase time) Dynamic Sitting Balance Dynamic Sitting - Balance Support: During functional activity;Feet supported Dynamic Sitting - Level of Assistance: 5: Stand by assistance;6: Modified independent (Device/Increase time) Sitting balance - Comments: Sitting on BSC and in w/c to complete bathing/dressing tasks Static Standing Balance Static Standing - Balance Support: During functional activity;No upper extremity supported Static Standing - Level of Assistance: 5: Stand by assistance Dynamic Standing Balance Dynamic Standing - Balance  Support: During functional activity;Right upper extremity supported;Left upper extremity supported Dynamic Standing - Level of Assistance: 5: Stand by assistance;4: Min assist Dynamic Standing - Comments: Standing to complete LB dressing task Extremity/Trunk Assessment RUE Assessment RUE Assessment: Exceptions to [facility] Active Range of Motion (AROM) Comments: Full ROM, however, limited in functional use 2/2 apraxia General Strength Comments: Unable to formally assess 2/2 apraxia, able to use at non-dominant level mod I and demosntrates ability for functional grasp strength RUE Body System: Neuro  Brunstrum levels for arm and hand: Hand;Arm Brunstrum level for arm: Stage V Relative Independence from Synergy Brunstrum level for hand: Stage VI Isolated joint movements LUE Assessment LUE Assessment: Within Functional Limits   Lashann Hagg L 06/24/2018, 2:27 PM

## 2018-06-24 NOTE — Plan of Care (Signed)
  Problem: RH BOWEL ELIMINATION Goal: RH STG MANAGE BOWEL WITH ASSISTANCE Description STG Manage Bowel with Assistance. max  Outcome: Progressing  Administer enema and other bowel regimen, ordered and needed  Problem: RH BLADDER ELIMINATION Goal: RH STG MANAGE BLADDER WITH ASSISTANCE Description STG Manage Bladder With Assistance. Max  Outcome: Progressing  Intermittent cath, when needed; encourage to empty bladder  Problem: RH SAFETY Goal: RH STG ADHERE TO SAFETY PRECAUTIONS W/ASSISTANCE/DEVICE Description STG Adhere to Safety Precautions With Assistance/Device. max  Outcome: Progressing  Call light at hand, chair/bed alarm, proper footwear

## 2018-06-24 NOTE — Progress Notes (Signed)
Occupational Therapy Session Note  Patient Details  Name: [name] [name] MRN: [id number] Date of Birth: 28-Oct-1985  Today's Date: 06/24/2018 OT Individual Time: 1000-1100 OT Individual Time Calculation (min): 60 min    Short Term Goals: Week 3:  OT Short Term Goal 1 (Week 3): Pt will complete LB clothing management sit>stand with +1 assist in order to reduce caregiver burden OT Short Term Goal 2 (Week 3): Pt will initiate use of R UE during functional task with min cuing OT Short Term Goal 3 (Week 3): Pt will complete slide board transfer to drop arm BSC with min A  Skilled Therapeutic Interventions/Progress Updates:    Pt seen for OT ADL bathing/dressing session. Pt sitting up in bed upon arrival with hand off from SLP. Pt agreeable to tx session and desiring to shower. Throughout session, she completed squat pivot or stand pivot transfers with min A overall, occasional cuing for technique and hand placement.  She bathed seated on BSC with supervision, able to use R UE at non-dominant level to assist with bathing and set-up tasks. She returned to w/c to dress, required to retrieve needed clothing items with R UE and to name all articles of clothing, completed with slightly increased time. Pt with incontinent urination while seated in w/c, reports that she didn't know she was going until she saw it falling on floor. Completed hygiene and donned new brief standing at sink with close supervision. Pants donned in same manner. Pt able to cross B LEs into figure four position in order to don socks.  Per RN request pt needing to return to bed. Min A stand pivot back to bed. Pt left in supine with all needs in reach and RN present.    Therapy Documentation Precautions:  Precautions Precautions: Fall Precaution Comments: right hemiplegia Restrictions Weight Bearing Restrictions: No Pain:   No/denies pain   Therapy/Group: Individual Therapy  Aiken Withem L 06/24/2018, 7:08 AM

## 2018-06-24 NOTE — Progress Notes (Signed)
Report given to Montel Clock, RN at [facility]

## 2018-06-24 NOTE — Progress Notes (Signed)
Social Work  Discharge Note  The overall goal for the admission was met for:   Discharge location: Yes-[facility] [place] [facility]  Length of Stay: Yes-27 DAYS  Discharge activity level: Yes-MIN-MOD ASSIST LEVEL  Home/community participation: Yes  Services provided included: MD, RD, PT, OT, SLP, RN, CM, TR, Pharmacy, Neuropsych and SW  Financial Services: Medicaid  Follow-up services arranged: Other: NHP  Comments (or additional information):PT NEEDS MORE REHAB BEFORE GOING HOME WITH HER MOM AND HER CHILDREN.  Patient/Family verbalized understanding of follow-up arrangements: Yes  Individual responsible for coordination of the follow-up plan: CAMILLE-MOM  Confirmed correct DME delivered: Elease Hashimoto 06/24/2018    Elease Hashimoto

## 2018-07-24 ENCOUNTER — Encounter: Payer: Medicaid Other | Admitting: Physical Medicine & Rehabilitation

## 2018-08-07 ENCOUNTER — Encounter: Payer: Self-pay | Admitting: Physical Medicine & Rehabilitation

## 2018-08-07 ENCOUNTER — Encounter: Payer: Medicaid Other | Attending: Physical Medicine & Rehabilitation | Admitting: Physical Medicine & Rehabilitation

## 2018-08-07 ENCOUNTER — Other Ambulatory Visit: Payer: Self-pay

## 2018-08-07 VITALS — BP 133/91 | HR 70 | Ht 67.0 in | Wt 206.0 lb

## 2018-08-07 DIAGNOSIS — M1711 Unilateral primary osteoarthritis, right knee: Secondary | ICD-10-CM | POA: Diagnosis not present

## 2018-08-07 DIAGNOSIS — I639 Cerebral infarction, unspecified: Secondary | ICD-10-CM

## 2018-08-07 DIAGNOSIS — M7120 Synovial cyst of popliteal space [Baker], unspecified knee: Secondary | ICD-10-CM | POA: Insufficient documentation

## 2018-08-07 DIAGNOSIS — Z79899 Other long term (current) drug therapy: Secondary | ICD-10-CM | POA: Insufficient documentation

## 2018-08-07 DIAGNOSIS — R32 Unspecified urinary incontinence: Secondary | ICD-10-CM | POA: Diagnosis not present

## 2018-08-07 DIAGNOSIS — S83271D Complex tear of lateral meniscus, current injury, right knee, subsequent encounter: Secondary | ICD-10-CM | POA: Diagnosis not present

## 2018-08-07 DIAGNOSIS — Z8249 Family history of ischemic heart disease and other diseases of the circulatory system: Secondary | ICD-10-CM | POA: Diagnosis not present

## 2018-08-07 DIAGNOSIS — M1990 Unspecified osteoarthritis, unspecified site: Secondary | ICD-10-CM | POA: Insufficient documentation

## 2018-08-07 DIAGNOSIS — I69351 Hemiplegia and hemiparesis following cerebral infarction affecting right dominant side: Secondary | ICD-10-CM

## 2018-08-07 DIAGNOSIS — I10 Essential (primary) hypertension: Secondary | ICD-10-CM | POA: Insufficient documentation

## 2018-08-07 DIAGNOSIS — R4701 Aphasia: Secondary | ICD-10-CM

## 2018-08-07 DIAGNOSIS — K5900 Constipation, unspecified: Secondary | ICD-10-CM | POA: Diagnosis not present

## 2018-08-07 DIAGNOSIS — I63522 Cerebral infarction due to unspecified occlusion or stenosis of left anterior cerebral artery: Secondary | ICD-10-CM | POA: Diagnosis not present

## 2018-08-07 DIAGNOSIS — M25561 Pain in right knee: Secondary | ICD-10-CM

## 2018-08-07 DIAGNOSIS — F17210 Nicotine dependence, cigarettes, uncomplicated: Secondary | ICD-10-CM | POA: Diagnosis not present

## 2018-08-07 DIAGNOSIS — J45909 Unspecified asthma, uncomplicated: Secondary | ICD-10-CM | POA: Insufficient documentation

## 2018-08-07 DIAGNOSIS — I82409 Acute embolism and thrombosis of unspecified deep veins of unspecified lower extremity: Secondary | ICD-10-CM | POA: Insufficient documentation

## 2018-08-07 DIAGNOSIS — R262 Difficulty in walking, not elsewhere classified: Secondary | ICD-10-CM | POA: Insufficient documentation

## 2018-08-07 DIAGNOSIS — Z993 Dependence on wheelchair: Secondary | ICD-10-CM | POA: Diagnosis not present

## 2018-08-07 DIAGNOSIS — K5901 Slow transit constipation: Secondary | ICD-10-CM

## 2018-08-07 DIAGNOSIS — N3941 Urge incontinence: Secondary | ICD-10-CM

## 2018-08-07 DIAGNOSIS — S83289A Other tear of lateral meniscus, current injury, unspecified knee, initial encounter: Secondary | ICD-10-CM | POA: Insufficient documentation

## 2018-08-07 DIAGNOSIS — R269 Unspecified abnormalities of gait and mobility: Secondary | ICD-10-CM

## 2018-08-07 NOTE — Progress Notes (Signed)
Subjective:    Patient ID: [name] [name], female    DOB: June 03, 1986, 33 y.o.   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  Admit date: 05/27/2018 Discharge date: 06/24/2018  She was discharged to a SNF.  At discharge, she states she is no longer using KI.  She states she had repeat doppler with resolution of DVT per pt. She has had blood since being discharged. She saw Neurosurg and continues to follow up. Pain is controlled. BP is controlled. She states she has stopped taking Baclofen.  Bowel movements have improved. She continues to have bladder incontinence, but states she is starting to be more aware.  Denies falls.   Pain Inventory Average Pain 3 Pain Right Now 0 My pain is sharp, dull, stabbing, tingling and aching  In the last 24 hours, has pain interfered with the following? General activity 0 Relation with others 0 Enjoyment of life 0 What TIME of day is your pain at its worst? night Sleep (in general) Good  Pain is worse with: inactivity Pain improves with: rest, heat/ice, therapy/exercise, pacing activities and medication Relief from Meds: 9  Mobility use a cane use a walker how many minutes can you walk? none ability to climb steps?  no do you drive?  no use a wheelchair needs help with transfers transfers alone  Function disabled: date disabled 11/2017 I need assistance with the following:  dressing and toileting  Neuro/Psych bladder control problems bowel control problems weakness tingling trouble walking  Prior Studies x-rays CT/MRI  Physicians involved in your care Neurosurgeon Dr. Moss Mc   Family History  Adopted: Yes  Problem Relation Age of Onset  . Hypertension Father   . Hypertension Maternal Aunt   . Hypertension Maternal Uncle   . Hypertension Maternal  Grandmother   . Eczema Maternal Grandmother   . Hypertension Maternal Grandfather   . Hypertension Paternal Grandmother   . Hypertension Mother   . Eczema Mother    Social History   Socioeconomic History  . Marital status: Single    Spouse name: Not on file  . Number of children: Not on file  . Years of education: Not on file  . Highest education level: Not on file  Occupational History  . Not on file  Social Needs  . Financial resource strain: Not on file  . Food insecurity:    Worry: Not on file    Inability: Not on file  . Transportation needs:    Medical: Not on file    Non-medical: Not on file  Tobacco Use  . Smoking status: Current Every Day Smoker    Packs/day: 0.25    Types: Cigarettes  . Smokeless tobacco: Former Network engineer and Sexual Activity  . Alcohol use: No  . Drug use: No  . Sexual activity: Not Currently    Birth control/protection: None  Lifestyle  . Physical activity:    Days per week: Not on file    Minutes per session: Not on file  . Stress: Not on file  Relationships  . Social connections:    Talks on phone: Not on file    Gets together: Not on file    Attends religious service: Not on file    Active member of club or organization: Not on file    Attends meetings of clubs or organizations: Not on  file    Relationship status: Not on file  Other Topics Concern  . Not on file  Social History Narrative  . Not on file   Past Surgical History:  Procedure Laterality Date  . 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 IR 3D INDEPENDENT WKST  05/10/2018  . IR ANGIO INTRA EXTRACRAN SEL INTERNAL CAROTID BILAT MOD SED  05/10/2018  . IR ANGIO VERTEBRAL SEL VERTEBRAL BILAT MOD SED  05/10/2018  . RADIOLOGY WITH ANESTHESIA N/A 05/10/2018   Procedure: EMBOLIZATION OF ANEURSYM;   Surgeon: Consuella Lose, MD;  Location: [place];  Service: Radiology;  Laterality: N/A;  . TUBAL LIGATION N/A 12/15/2016   Procedure: POST PARTUM TUBAL LIGATION;  Surgeon: Mora Bellman, MD;  Location: [facility];  Service: Gynecology;  Laterality: N/A;  . WISDOM TOOTH EXTRACTION     Past Medical History:  Diagnosis Date  . Asthma, allergic    uses inhaler prn - infrequently  . Bilateral thoracic back pain 09/13/2015  . Eczema   . Hypertension   . Lactose intolerance   . Mature cystic teratoma of ovary   . Mood swings 06/01/2015  . Seasonal allergies    BP (!) 133/91   Pulse 70   Ht 5\' 7"  (1.702 m)   Wt 206 lb (93.4 kg)   SpO2 98%   BMI 32.26 kg/m   Opioid Risk Score:   Fall Risk Score:  `1  Depression screen PHQ 2/9  Depression screen [facility] 2/9 08/07/2018 04/12/2018 10/25/2017 07/25/2017 02/02/2017 12/11/2016 12/07/2016  Decreased Interest 0 0 0 0 1 1 1   Down, Depressed, Hopeless 0 0 0 0 0 0 0  PHQ - 2 Score 0 0 0 0 1 1 1   Altered sleeping - 1 1 1  0 2 2  Tired, decreased energy - 1 2 1  0 1 2  Change in appetite - 0 0 0 0 0 0  Feeling bad or failure about yourself  - 0 0 0 0 0 0  Trouble concentrating - 0 1 0 1 0 0  Moving slowly or fidgety/restless - 0 0 0 0 0 1  Suicidal thoughts - 0 0 0 0 0 0  PHQ-9 Score - 2 4 2 2 4 6   Some recent data might be hidden   Review of Systems  Constitutional: Negative.   HENT: Negative.   Eyes: Negative.   Respiratory: Negative.   Cardiovascular: Negative.   Gastrointestinal: Positive for constipation.  Endocrine: Negative.   Genitourinary: Negative.   Musculoskeletal: Positive for arthralgias, gait problem, joint swelling and myalgias.  Skin: Negative.   Allergic/Immunologic: Negative.   Neurological: Positive for weakness. Negative for numbness.  Hematological: Negative.   Psychiatric/Behavioral: Negative.   All other systems reviewed and are negative.      Objective:   Physical Exam Constitutional: No distress . Vital signs  reviewed. HENT: Normocephalic.  Atraumatic. Eyes: EOMI. No discharge. Cardiovascular: RRR. No JVD. Respiratory: CTA Bilaterally. Normal effort. GI: BS +. Non-distended. Musc: Right ankle edema Neurological: She is alert and oriented  Verbal output significantly improved Motor: LUE/LLE: 5/5 proximal to distal RUE: 4-/5 proximal to distal  RLE 3+/5 HF, KE, 2/5 ADF No increase in tone appreciated.  Skin: Scattered eczematous patches Psychiatric: She has a normal mood and affect. Her behavior is normal.  Nursing note and vitals reviewed.    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  1.  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             Continue therapies at SNF  2.  DVT:  Right peroneal and posterior tibial vein acute resolved per pt on repeat scan at SNF  3. Pain Management:   Relatively controlled per pt  Baker's cyst, lateral meniscal tear, and OA.    4. HTN:   Cont meds  Controlled today              5.  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  Constipation  Cont meds  Improving  7. Urinary incontinence  Persistent, but improving  Encouraged timed toileting  8. Gait abnormality  Cont walker/wheelchair for mobility

## 2018-10-09 ENCOUNTER — Other Ambulatory Visit: Payer: Self-pay

## 2018-10-09 ENCOUNTER — Encounter: Payer: Self-pay | Admitting: Physical Medicine & Rehabilitation

## 2018-10-09 ENCOUNTER — Telehealth: Payer: Self-pay | Admitting: Family Medicine

## 2018-10-09 ENCOUNTER — Encounter: Payer: Medicaid Other | Attending: Physical Medicine & Rehabilitation | Admitting: Physical Medicine & Rehabilitation

## 2018-10-09 VITALS — Ht 67.0 in | Wt 214.0 lb

## 2018-10-09 DIAGNOSIS — I82409 Acute embolism and thrombosis of unspecified deep veins of unspecified lower extremity: Secondary | ICD-10-CM | POA: Insufficient documentation

## 2018-10-09 DIAGNOSIS — M7120 Synovial cyst of popliteal space [Baker], unspecified knee: Secondary | ICD-10-CM | POA: Insufficient documentation

## 2018-10-09 DIAGNOSIS — S83271D Complex tear of lateral meniscus, current injury, right knee, subsequent encounter: Secondary | ICD-10-CM

## 2018-10-09 DIAGNOSIS — I69351 Hemiplegia and hemiparesis following cerebral infarction affecting right dominant side: Secondary | ICD-10-CM

## 2018-10-09 DIAGNOSIS — J45909 Unspecified asthma, uncomplicated: Secondary | ICD-10-CM | POA: Insufficient documentation

## 2018-10-09 DIAGNOSIS — M1990 Unspecified osteoarthritis, unspecified site: Secondary | ICD-10-CM | POA: Insufficient documentation

## 2018-10-09 DIAGNOSIS — R262 Difficulty in walking, not elsewhere classified: Secondary | ICD-10-CM | POA: Insufficient documentation

## 2018-10-09 DIAGNOSIS — S83289A Other tear of lateral meniscus, current injury, unspecified knee, initial encounter: Secondary | ICD-10-CM | POA: Insufficient documentation

## 2018-10-09 DIAGNOSIS — I10 Essential (primary) hypertension: Secondary | ICD-10-CM | POA: Insufficient documentation

## 2018-10-09 DIAGNOSIS — K5900 Constipation, unspecified: Secondary | ICD-10-CM | POA: Insufficient documentation

## 2018-10-09 DIAGNOSIS — I639 Cerebral infarction, unspecified: Secondary | ICD-10-CM

## 2018-10-09 DIAGNOSIS — Z8249 Family history of ischemic heart disease and other diseases of the circulatory system: Secondary | ICD-10-CM | POA: Insufficient documentation

## 2018-10-09 DIAGNOSIS — I63522 Cerebral infarction due to unspecified occlusion or stenosis of left anterior cerebral artery: Secondary | ICD-10-CM | POA: Insufficient documentation

## 2018-10-09 DIAGNOSIS — R32 Unspecified urinary incontinence: Secondary | ICD-10-CM | POA: Insufficient documentation

## 2018-10-09 DIAGNOSIS — N3941 Urge incontinence: Secondary | ICD-10-CM

## 2018-10-09 DIAGNOSIS — K5901 Slow transit constipation: Secondary | ICD-10-CM

## 2018-10-09 DIAGNOSIS — Z993 Dependence on wheelchair: Secondary | ICD-10-CM | POA: Insufficient documentation

## 2018-10-09 DIAGNOSIS — R4701 Aphasia: Secondary | ICD-10-CM | POA: Insufficient documentation

## 2018-10-09 DIAGNOSIS — R269 Unspecified abnormalities of gait and mobility: Secondary | ICD-10-CM | POA: Insufficient documentation

## 2018-10-09 DIAGNOSIS — Z79899 Other long term (current) drug therapy: Secondary | ICD-10-CM | POA: Insufficient documentation

## 2018-10-09 DIAGNOSIS — F17210 Nicotine dependence, cigarettes, uncomplicated: Secondary | ICD-10-CM | POA: Insufficient documentation

## 2018-10-09 NOTE — Telephone Encounter (Signed)
Nursing visit one time a week for 3 weeks  Med and meal teaching  She was released from [facility] home and doesn't have all of her meds.

## 2018-10-09 NOTE — Progress Notes (Signed)
Subjective:    Patient ID: [name] [name], female    DOB: [birth year], 33 y.o.   MRN: [id number]  TELEHEALTH NOTE  Due to national recommendations of social distancing due to COVID 19, an audio/video telehealth visit is felt to be most appropriate for this patient at this time.  See Chart message from today for the patient's consent to telehealth from [place].     I verified that I am speaking with the correct person using two identifiers.  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  Last clinic visit 08/07/2018.  Since that time, she states she returned home 2 days ago.  She has managed to adjust to home.  She has 24/7 supervision.  She is scheduled to start [facility] today. Her anticoag was stopped. Knee pain has improved. BP is controlled on meds. Spasticity is improving. Bowel movements have improved. Bladder function has improved as well. Denies falls. Using a cane for ambulation.  Pain Inventory Average Pain 4 Pain Right Now 0 My pain is sharp, dull, stabbing, tingling and aching  In the last 24 hours, has pain interfered with the following? General activity 3 Relation with others 0 Enjoyment of life 7 What TIME of day is your pain at its worst? daytime Sleep (in general) Good  Pain is worse with: inactivity Pain improves with: rest, heat/ice, therapy/exercise and pacing activities Relief from Meds: no pain medication  Mobility walk with assistance use a cane how many minutes can you walk? 10 ability to climb steps?  yes do you drive?  no  Function disabled: date  disabled 11/2017  Neuro/Psych weakness trouble walking  Prior Studies Any changes since last visit?  no  Physicians involved in your care Any changes since last visit?  no   Family History  Adopted: Yes  Problem Relation Age of Onset  . Hypertension Father   . Hypertension Maternal Aunt   . Hypertension Maternal Uncle   . Hypertension Maternal Grandmother   . Eczema Maternal Grandmother   . Hypertension Maternal Grandfather   . Hypertension Paternal Grandmother   . Hypertension Mother   . Eczema Mother    Social History   Socioeconomic History  . Marital status: Single    Spouse name: Not on file  . Number of children: Not on file  . Years of education: Not on file  . Highest education level: Not on file  Occupational History  . Not on file  Social Needs  . Financial resource strain: Not on file  . Food insecurity:    Worry: Not on file    Inability: Not on file  . Transportation needs:    Medical: Not on file    Non-medical: Not on file  Tobacco Use  . Smoking status: Current Every Day Smoker    Packs/day: 0.25    Types: Cigarettes  . Smokeless tobacco: Former Network engineer and Sexual Activity  . Alcohol use: No  . Drug use: No  . Sexual activity: Not Currently    Birth control/protection: None  Lifestyle  . Physical activity:    Days per week: Not on file    Minutes per session: Not  on file  . Stress: Not on file  Relationships  . Social connections:    Talks on phone: Not on file    Gets together: Not on file    Attends religious service: Not on file    Active member of club or organization: Not on file    Attends meetings of clubs or organizations: Not on file    Relationship status: Not on file  Other Topics Concern  . Not on file  Social History Narrative  . Not on file   Past Surgical History:  Procedure Laterality Date  . CRANIOTOMY N/A 05/10/2018   Procedure: CRANIOTOMY INTRACRANIAL ANEURYSM FOR Anterior Communicating Artery  Aneurysm;  Surgeon: Consuella Lose, MD;  Location: [place];  Service: Neurosurgery;  Laterality: N/A;  . IR 3D INDEPENDENT WKST  05/10/2018  . IR ANGIO INTRA EXTRACRAN SEL INTERNAL CAROTID BILAT MOD SED  05/10/2018  . IR ANGIO VERTEBRAL SEL VERTEBRAL BILAT MOD SED  05/10/2018  . RADIOLOGY WITH ANESTHESIA N/A 05/10/2018   Procedure: EMBOLIZATION OF ANEURSYM;  Surgeon: Consuella Lose, MD;  Location: [place];  Service: Radiology;  Laterality: N/A;  . TUBAL LIGATION N/A 12/15/2016   Procedure: POST PARTUM TUBAL LIGATION;  Surgeon: Mora Bellman, MD;  Location: [facility];  Service: Gynecology;  Laterality: N/A;  . WISDOM TOOTH EXTRACTION     Past Medical History:  Diagnosis Date  . Asthma, allergic    uses inhaler prn - infrequently  . Bilateral thoracic back pain 09/13/2015  . Eczema   . Hypertension   . Lactose intolerance   . Mature cystic teratoma of ovary   . Mood swings 06/01/2015  . Seasonal allergies    Ht 5\' 7"  (1.702 m)   Wt 214 lb (97.1 kg) Comment: patient reported , home scale  BMI 33.52 kg/m   Opioid Risk Score:   Fall Risk Score:  `1  Depression screen PHQ 2/9  Depression screen [facility] 2/9 08/07/2018 04/12/2018 10/25/2017 07/25/2017 02/02/2017 12/11/2016 12/07/2016  Decreased Interest 0 0 0 0 1 1 1   Down, Depressed, Hopeless 0 0 0 0 0 0 0  PHQ - 2 Score 0 0 0 0 1 1 1   Altered sleeping - 1 1 1  0 2 2  Tired, decreased energy - 1 2 1  0 1 2  Change in appetite - 0 0 0 0 0 0  Feeling bad or failure about yourself  - 0 0 0 0 0 0  Trouble concentrating - 0 1 0 1 0 0  Moving slowly or fidgety/restless - 0 0 0 0 0 1  Suicidal thoughts - 0 0 0 0 0 0  PHQ-9 Score - 2 4 2 2 4 6   Some recent data might be hidden   Review of Systems  Constitutional: Negative.   HENT: Negative.   Eyes: Negative.   Respiratory: Negative.   Cardiovascular: Negative.   Gastrointestinal: Negative.   Endocrine: Negative.   Genitourinary: Negative.   Musculoskeletal: Positive for arthralgias, gait  problem, joint swelling and myalgias.  Skin: Negative.   Allergic/Immunologic: Negative.   Neurological: Positive for weakness. Negative for numbness.  Hematological: Negative.   Psychiatric/Behavioral: Negative.   All other systems reviewed and are negative.      Objective:   Physical Exam Gen: NAD. 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  1.  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             Begin HH therapies  2. Pain Management:   Controlled, occasionally using Tylenol  Baker's cyst, lateral meniscal tear, and OA.    3. HTN:   Cont meds  Improving on new BP meds              4.  Spastic hemiplegia:   Right lower extremity with increased hamstring tone improving per patient  5.  Constipation  Improving  6. Urinary incontinence  Improving  7. Gait abnormality  Cont cane for safety  Therapies to begin today

## 2018-10-10 ENCOUNTER — Telehealth: Payer: Self-pay | Admitting: Family Medicine

## 2018-10-10 NOTE — Telephone Encounter (Signed)
Erlene Quan was called and he states that he does not need the verbal orders for OT. Patient is going to stick with PT.

## 2018-10-10 NOTE — Telephone Encounter (Signed)
[facility] called to request occupational for the patient 1 time a week for up to 4 weeks. Please follow up.

## 2018-10-10 NOTE — Telephone Encounter (Signed)
Merry Proud called requesting verbal order for patients [name] [name], please follow up

## 2018-10-14 NOTE — Telephone Encounter (Signed)
Jeff was called and given verbal orders for PT. 

## 2018-11-09 ENCOUNTER — Emergency Department (HOSPITAL_COMMUNITY)
Admission: EM | Admit: 2018-11-09 | Discharge: 2018-11-09 | Disposition: A | Discharge status: Home / Self Care | Payer: Medicaid Other | Attending: Emergency Medicine | Admitting: Emergency Medicine

## 2018-11-09 ENCOUNTER — Emergency Department (HOSPITAL_COMMUNITY): Payer: Medicaid Other

## 2018-11-09 ENCOUNTER — Encounter (HOSPITAL_COMMUNITY): Payer: Self-pay | Admitting: Emergency Medicine

## 2018-11-09 ENCOUNTER — Other Ambulatory Visit: Payer: Self-pay

## 2018-11-09 DIAGNOSIS — Z79899 Other long term (current) drug therapy: Secondary | ICD-10-CM | POA: Insufficient documentation

## 2018-11-09 DIAGNOSIS — F17210 Nicotine dependence, cigarettes, uncomplicated: Secondary | ICD-10-CM | POA: Insufficient documentation

## 2018-11-09 DIAGNOSIS — Z8679 Personal history of other diseases of the circulatory system: Secondary | ICD-10-CM | POA: Insufficient documentation

## 2018-11-09 DIAGNOSIS — J45909 Unspecified asthma, uncomplicated: Secondary | ICD-10-CM | POA: Insufficient documentation

## 2018-11-09 DIAGNOSIS — I10 Essential (primary) hypertension: Secondary | ICD-10-CM | POA: Insufficient documentation

## 2018-11-09 DIAGNOSIS — Z8673 Personal history of transient ischemic attack (TIA), and cerebral infarction without residual deficits: Secondary | ICD-10-CM | POA: Insufficient documentation

## 2018-11-09 DIAGNOSIS — R569 Unspecified convulsions: Secondary | ICD-10-CM | POA: Diagnosis present

## 2018-11-09 LAB — I-STAT CHEM 8, ED
BUN: 4 mg/dL — ABNORMAL LOW (ref 6–20)
Calcium, Ion: 1.02 mmol/L — ABNORMAL LOW (ref 1.15–1.40)
Chloride: 110 mmol/L (ref 98–111)
Creatinine, Ser: 0.5 mg/dL (ref 0.44–1.00)
Glucose, Bld: 98 mg/dL (ref 70–99)
HCT: 41 % (ref 36.0–46.0)
Hemoglobin: 13.9 g/dL (ref 12.0–15.0)
Potassium: 3.6 mmol/L (ref 3.5–5.1)
Sodium: 140 mmol/L (ref 135–145)
TCO2: 18 mmol/L — ABNORMAL LOW (ref 22–32)

## 2018-11-09 LAB — DIFFERENTIAL
Abs Immature Granulocytes: 0.02 10*3/uL (ref 0.00–0.07)
Basophils Absolute: 0 10*3/uL (ref 0.0–0.1)
Basophils Relative: 0 %
Eosinophils Absolute: 0.1 10*3/uL (ref 0.0–0.5)
Eosinophils Relative: 2 %
Immature Granulocytes: 1 %
Lymphocytes Relative: 54 %
Lymphs Abs: 2.3 10*3/uL (ref 0.7–4.0)
Monocytes Absolute: 0.3 10*3/uL (ref 0.1–1.0)
Monocytes Relative: 8 %
Neutro Abs: 1.5 10*3/uL — ABNORMAL LOW (ref 1.7–7.7)
Neutrophils Relative %: 35 %

## 2018-11-09 LAB — CBC
HCT: 41.9 % (ref 36.0–46.0)
Hemoglobin: 13.3 g/dL (ref 12.0–15.0)
MCH: 32.1 pg (ref 26.0–34.0)
MCHC: 31.7 g/dL (ref 30.0–36.0)
MCV: 101.2 fL — ABNORMAL HIGH (ref 80.0–100.0)
Platelets: 203 10*3/uL (ref 150–400)
RBC: 4.14 MIL/uL (ref 3.87–5.11)
RDW: 13.9 % (ref 11.5–15.5)
WBC: 4.2 10*3/uL (ref 4.0–10.5)
nRBC: 0 % (ref 0.0–0.2)

## 2018-11-09 LAB — COMPREHENSIVE METABOLIC PANEL
ALT: 21 U/L (ref 0–44)
AST: 25 U/L (ref 15–41)
Albumin: 3.4 g/dL — ABNORMAL LOW (ref 3.5–5.0)
Alkaline Phosphatase: 79 U/L (ref 38–126)
Anion gap: 14 (ref 5–15)
BUN: <5 mg/dL — ABNORMAL LOW (ref 6–20)
CO2: 16 mmol/L — ABNORMAL LOW (ref 22–32)
Calcium: 8.5 mg/dL — ABNORMAL LOW (ref 8.9–10.3)
Chloride: 106 mmol/L (ref 98–111)
Creatinine, Ser: 0.83 mg/dL (ref 0.44–1.00)
GFR calc Af Amer: >60 mL/min (ref >60)
GFR calc non Af Amer: >60 mL/min (ref >60)
Glucose, Bld: 104 mg/dL — ABNORMAL HIGH (ref 70–99)
Potassium: 3.5 mmol/L (ref 3.5–5.1)
Sodium: 136 mmol/L (ref 135–145)
Total Bilirubin: 0.2 mg/dL — ABNORMAL LOW (ref 0.3–1.2)
Total Protein: 6.5 g/dL (ref 6.5–8.1)

## 2018-11-09 LAB — PROTIME-INR
INR: 0.9 (ref 0.8–1.2)
Prothrombin Time: 12.4 seconds (ref 11.4–15.2)

## 2018-11-09 LAB — I-STAT BETA HCG BLOOD, ED (MC, WL, AP ONLY): I-stat hCG, quantitative: <5 m[IU]/mL (ref <5)

## 2018-11-09 LAB — APTT: aPTT: 27 seconds (ref 24–36)

## 2018-11-09 LAB — CBG MONITORING, ED: Glucose-Capillary: 96 mg/dL (ref 70–99)

## 2018-11-09 MED ORDER — ONDANSETRON HCL 4 MG PO TABS: 4.0000 mg | ORAL_TABLET | Freq: Three times a day (TID) | ORAL | Status: DC | PRN

## 2018-11-09 MED ORDER — ACETAMINOPHEN 325 MG PO TABS: 650.0000 mg | ORAL_TABLET | ORAL | Status: DC | PRN

## 2018-11-09 MED ORDER — ALUM & MAG HYDROXIDE-SIMETH 200-200-20 MG/5ML PO SUSP: 30.0000 mL | Freq: Four times a day (QID) | ORAL | Status: DC | PRN

## 2018-11-09 MED ORDER — LEVETIRACETAM IN NACL 1000 MG/100ML IV SOLN
1000.0000 mg | Freq: Once | INTRAVENOUS | Status: AC
  Administered 2018-11-09: 1000 mg via INTRAVENOUS
  Filled 2018-11-09: qty 100

## 2018-11-09 MED ORDER — IOHEXOL 350 MG/ML SOLN
50.0000 mL | Freq: Once | INTRAVENOUS | Status: AC | PRN
  Administered 2018-11-09: 50 mL via INTRAVENOUS

## 2018-11-09 MED ORDER — SODIUM CHLORIDE 0.9% FLUSH
3.0000 mL | Freq: Once | INTRAVENOUS | Status: AC
Start: 2018-11-09 — End: 2018-11-09
  Administered 2018-11-09: 3 mL via INTRAVENOUS

## 2018-11-09 MED ORDER — LEVETIRACETAM 500 MG PO TABS: 500.0000 mg | ORAL_TABLET | Freq: Two times a day (BID) | ORAL | 0 refills | Status: DC

## 2018-11-09 NOTE — ED Notes (Signed)
Pt given coke, per Dr. Eddie Dibbles approval.

## 2018-11-09 NOTE — ED Provider Notes (Signed)
[facility] EMERGENCY DEPARTMENT Provider Note   CSN: [id number] Arrival date & time: 11/09/18  1151    History   Chief Complaint Chief Complaint  Patient presents with   Code Stroke    HPI Sheva Mcdougle is a 33 y.o. female.     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 Pt was asleep in bed and boyfriend reportedly witnessed seizure-like activity. The next thing she knew, he was waking her up saying that she was breathing weird. EMS noted to she was post ictal with some mild slurred speech. Currently, she feels at baseline with mild R sided weakness. She denies headache or vomiting. No h/o seizures. Has been compliant with medications. Denies recent illness including no fever, cough/cold, vomiting, diarrhea.   EMS called code stroke in route.     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   Non-fluent aphasia    Complex tear of lateral meniscus of right knee as current injury    Primary osteoarthritis of right knee    Knee pain, right    Spastic hemiplegia of right dominant side as late effect of cerebral infarction (HCC)    Leukopenia    Acute deep vein thrombosis (DVT) of right peroneal vein    Essential hypertension    Hyperglycemia    SAH (subarachnoid hemorrhage) (HCC)    Acute cerebral infarction ([place])    HCAP (healthcare-associated pneumonia)    Benign essential HTN    Acute blood loss anemia    Right hemiplegia (HCC)    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)    Endotracheal tube present      Brain aneurysm    Subarachnoid hemorrhage ([place]) 05/09/2018   Gastroesophageal reflux disease without esophagitis 10/09/2016   BMI 31.0-31.9,adult 07/20/2016   Obesity in pregnancy, antepartum 07/20/2016   Tobacco abuse 06/01/2015   Marijuana use 06/23/2014    Past Surgical History:  Procedure Laterality Date   CRANIOTOMY N/A 05/10/2018   Procedure: 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;  Surgeon: Consuella Lose, MD;  Location: [facility];  Service: Neurosurgery;  Laterality: N/A;   IR 3D INDEPENDENT WKST  05/10/2018   IR ANGIO INTRA EXTRACRAN SEL INTERNAL CAROTID BILAT MOD SED  05/10/2018   IR ANGIO VERTEBRAL SEL VERTEBRAL BILAT MOD SED  05/10/2018   RADIOLOGY WITH ANESTHESIA N/A 05/10/2018   Procedure: EMBOLIZATION OF ANEURSYM;  Surgeon: Consuella Lose, MD;  Location: [facility];  Service: Radiology;  Laterality: N/A;   TUBAL LIGATION N/A 12/15/2016   Procedure: POST PARTUM TUBAL LIGATION;  Surgeon: Mora Bellman, MD;  Location: [facility];  Service: Gynecology;  Laterality: N/A;   WISDOM TOOTH EXTRACTION       OB History    Gravida  3   Para  3   Term  3   Preterm  0   AB  0   Living  3     SAB  0   TAB  0   Ectopic  0   Multiple  0  Live Births  3            Home Medications    Prior to Admission medications   Medication Sig Start Date End Date Taking? Authorizing Provider  acetaminophen (TYLENOL) 325 MG tablet Take 1-2 tablets (325-650 mg total) by mouth every 4 (four) hours as needed for mild pain. 06/24/18   Love, Ivan Anchors, PA-C  amLODipine (NORVASC) 10 MG tablet Take 10 mg by mouth daily.    [provider]  baclofen (LIORESAL) 20 MG tablet Take 1 tablet (20 mg total) by mouth 3 (three) times daily. 06/24/18   Love, Ivan Anchors, PA-C  bisacodyl (DULCOLAX) 10 MG suppository Place 1 suppository (10 mg total) rectally daily as needed for moderate constipation. 06/24/18   Love, Ivan Anchors, PA-C   cetirizine (ZYRTEC) 10 MG tablet TAKE 1 TABLET BY MOUTH ONCE DAILY Patient taking differently: Take 10 mg by mouth daily.  03/07/18   Charlott Rakes, MD  docusate sodium (COLACE) 100 MG capsule Take 100 mg by mouth 2 (two) times daily.    [provider]  famotidine (PEPCID) 20 MG tablet Take 1 tablet (20 mg total) by mouth 2 (two) times daily. 06/24/18   Love, Ivan Anchors, PA-C  feeding supplement, ENSURE ENLIVE, (ENSURE ENLIVE) LIQD Take 237 mLs by mouth 2 (two) times daily between meals. 06/24/18   Love, Ivan Anchors, PA-C  fluticasone (FLONASE) 50 MCG/ACT nasal spray Place 1 spray into both nostrils daily. 06/25/18   Love, Ivan Anchors, PA-C  montelukast (SINGULAIR) 10 MG tablet TAKE 1 TABLET BY MOUTH ONCE DAILY AT BEDTIME Patient taking differently: Take 10 mg by mouth at bedtime.  03/07/18   Charlott Rakes, MD  Multiple Vitamin (MULTIVITAMIN WITH MINERALS) TABS tablet Take 1 tablet by mouth daily. 06/25/18   Love, Ivan Anchors, PA-C  senna-docusate (SENOKOT-S) 8.6-50 MG tablet Take 2 tablets by mouth 2 (two) times daily. 06/24/18   Love, Ivan Anchors, PA-C  sodium chloride (OCEAN) 0.65 % SOLN nasal spray Place 1 spray into both nostrils 2 (two) times daily before lunch and supper. 06/24/18   Love, Ivan Anchors, PA-C    Family History Family History  Adopted: Yes  Problem Relation Age of Onset   Hypertension Father    Hypertension Maternal Aunt    Hypertension Maternal Uncle    Hypertension Maternal Grandmother    Eczema Maternal Grandmother    Hypertension Maternal Grandfather    Hypertension Paternal Grandmother    Hypertension Mother    Eczema Mother     Social History Social History   Tobacco Use   Smoking status: Current Every Day Smoker    Packs/day: 0.25    Types: Cigarettes   Smokeless tobacco: Former Systems developer  Substance Use Topics   Alcohol use: No   Drug use: No     Allergies   Sulfa antibiotics and Shellfish allergy   Review of Systems Review of Systems All other  systems reviewed and are negative except that which was mentioned in HPI   Physical Exam Updated Vital Signs BP 123/90    Pulse 64    Temp 98.1 F (36.7 C) (Oral)    Resp (!) 28    Ht 5\' 7"  (1.702 m)    Wt 97.1 kg    SpO2 100%    BMI 33.52 kg/m   Physical Exam Vitals signs and nursing note reviewed.  Constitutional:      General: She is not in acute distress.    Appearance: She is well-developed.  Comments: Awake, alert  HENT:     Head: Normocephalic and atraumatic.  Eyes:     Extraocular Movements: Extraocular movements intact.     Conjunctiva/sclera: Conjunctivae normal.     Pupils: Pupils are equal, round, and reactive to light.  Neck:     Musculoskeletal: Neck supple.  Cardiovascular:     Rate and Rhythm: Normal rate and regular rhythm.     Heart sounds: Normal heart sounds. No murmur.  Pulmonary:     Effort: Pulmonary effort is normal. No respiratory distress.     Breath sounds: Normal breath sounds.  Abdominal:     General: Bowel sounds are normal. There is no distension.     Palpations: Abdomen is soft.     Tenderness: There is no abdominal tenderness.  Musculoskeletal:     Right lower leg: No edema.     Left lower leg: No edema.  Skin:    General: Skin is warm and dry.  Neurological:     Mental Status: She is alert and oriented to person, place, and time.     Cranial Nerves: No cranial nerve deficit.     Motor: No abnormal muscle tone.     Deep Tendon Reflexes: Reflexes are normal and symmetric.     Comments: Fluent speech, oriented x 3 4/5 strength RUE, RLE 5/5 strength LUE, LLE Mild dysmetria R finger-to-nose normal sensation x all 4 extremities  Psychiatric:        Thought Content: Thought content normal.        Judgment: Judgment normal.      ED Treatments / Results  Labs (all labs ordered are listed, but only abnormal results are displayed) Labs Reviewed  CBC - Abnormal; Notable for the following components:      Result Value   MCV 101.2  (*)    All other components within normal limits  DIFFERENTIAL - Abnormal; Notable for the following components:   Neutro Abs 1.5 (*)    All other components within normal limits  COMPREHENSIVE METABOLIC PANEL - Abnormal; Notable for the following components:   CO2 16 (*)    Glucose, Bld 104 (*)    BUN <5 (*)    Calcium 8.5 (*)    Albumin 3.4 (*)    Total Bilirubin 0.2 (*)    All other components within normal limits  I-STAT CHEM 8, ED - Abnormal; Notable for the following components:   BUN 4 (*)    Calcium, Ion 1.02 (*)    TCO2 18 (*)    All other components within normal limits  PROTIME-INR  APTT  CBG MONITORING, ED  I-STAT BETA HCG BLOOD, ED ([facility], [facility], [facility] ONLY)    EKG None  Radiology Ct Angio Head W Or Wo Contrast  Result Date: 11/09/2018 CLINICAL DATA:  Focal neuro deficit.  History of aneurysm clipping. EXAM: CT ANGIOGRAPHY HEAD AND NECK TECHNIQUE: Multidetector CT imaging of the head and neck was performed using the standard protocol during bolus administration of intravenous contrast. Multiplanar CT image reconstructions and MIPs were obtained to evaluate the vascular anatomy. Carotid stenosis measurements (when applicable) are obtained utilizing NASCET criteria, using the distal internal carotid diameter as the denominator. CONTRAST:  38mL OMNIPAQUE IOHEXOL 350 MG/ML SOLN COMPARISON:  CT head today FINDINGS: CTA NECK FINDINGS Aortic arch: Standard branching. Imaged portion shows no evidence of aneurysm or dissection. No significant stenosis of the major arch vessel origins. Right carotid system: Normal right carotid system without stenosis or dissection Left carotid system:  Normal left carotid system without stenosis or dissection. Vertebral arteries: Both vertebral arteries widely patent to the basilar without stenosis or irregularity. Skeleton: Negative Other neck: Negative Upper chest: Lung apices clear bilaterally. Review of the MIP images confirms the above findings CTA HEAD  FINDINGS Anterior circulation: Cavernous carotid widely patent bilaterally. Aneurysm clip in the region of the left anterior communicating artery. No residual aneurysm. Occlusion of the left A2 origin just above the aneurysm clip. Adjacent chronic left anterior cerebral artery infarct. Right anterior cerebral artery patent. Middle cerebral arteries widely patent bilaterally. No other aneurysm Posterior circulation: Both vertebral arteries patent to the basilar. PICA patent bilaterally. Basilar widely patent. Superior cerebellar and posterior cerebral arteries patent bilaterally. Posterior cerebral arteries patent bilaterally. Venous sinuses: Patent Anatomic variants: None Delayed phase: Not perform Review of the MIP images confirms the above findings IMPRESSION: 1. Negative for emergent large vessel occlusion. 2. 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 No recurrent aneurysm. Occlusion of the left A2 segment just above the clip. Chronic left anterior cerebral artery infarct. 3. No significant carotid or vertebral artery stenosis in the neck. Electronically Signed   By: Franchot Gallo M.D.   On: 11/09/2018 12:43   Ct Angio Neck W Or Wo Contrast  Result Date: 11/09/2018 CLINICAL DATA:  Focal neuro deficit.  History of aneurysm clipping. EXAM: CT ANGIOGRAPHY HEAD AND NECK TECHNIQUE: Multidetector CT imaging of the head and neck was performed using the standard protocol during bolus administration of intravenous contrast. Multiplanar CT image reconstructions and MIPs were obtained to evaluate the vascular anatomy. Carotid stenosis measurements (when applicable) are obtained utilizing NASCET criteria, using the distal internal carotid diameter as the denominator. CONTRAST:  31mL OMNIPAQUE IOHEXOL 350 MG/ML SOLN COMPARISON:  CT head today FINDINGS: CTA NECK FINDINGS Aortic arch: Standard branching. Imaged portion shows no evidence of aneurysm or dissection. No significant stenosis of the major arch  vessel origins. Right carotid system: Normal right carotid system without stenosis or dissection Left carotid system: Normal left carotid system without stenosis or dissection. Vertebral arteries: Both vertebral arteries widely patent to the basilar without stenosis or irregularity. Skeleton: Negative Other neck: Negative Upper chest: Lung apices clear bilaterally. Review of the MIP images confirms the above findings CTA HEAD FINDINGS Anterior circulation: Cavernous carotid widely patent bilaterally. Aneurysm clip in the region of the left anterior communicating artery. No residual aneurysm. Occlusion of the left A2 origin just above the aneurysm clip. Adjacent chronic left anterior cerebral artery infarct. Right anterior cerebral artery patent. Middle cerebral arteries widely patent bilaterally. No other aneurysm Posterior circulation: Both vertebral arteries patent to the basilar. PICA patent bilaterally. Basilar widely patent. Superior cerebellar and posterior cerebral arteries patent bilaterally. Posterior cerebral arteries patent bilaterally. Venous sinuses: Patent Anatomic variants: None Delayed phase: Not perform Review of the MIP images confirms the above findings IMPRESSION: 1. Negative for emergent large vessel occlusion. 2. 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 No recurrent aneurysm. Occlusion of the left A2 segment just above the clip. Chronic left anterior cerebral artery infarct. 3. No significant carotid or vertebral artery stenosis in the neck. Electronically Signed   By: Franchot Gallo M.D.   On: 11/09/2018 12:43   Ct Head Code Stroke Wo Contrast  Result Date: 11/09/2018 CLINICAL DATA:  Code stroke. Right-sided weakness. Rule out stroke. History of aneurysm clipping. EXAM: CT HEAD WITHOUT CONTRAST TECHNIQUE: Contiguous axial images were obtained from the base of the skull through the vertex without intravenous contrast. COMPARISON:  CT  head 05/13/2018 FINDINGS: Brain: Chronic  infarct left anterior cerebral artery territory. Left frontal craniotomy for aneurysm clipping. Aneurysm clip in the region of the left anterior communicating artery. Negative for acute infarct.  Negative for hemorrhage or mass. Vascular: Aneurysm clip in the region of the left anterior communicating artery. Negative for hyperdense vessel Skull: Left frontal craniotomy Sinuses/Orbits: None Other: None ASPECTS ([place] Stroke Program Early CT Score) - Ganglionic level infarction (caudate, lentiform nuclei, internal capsule, insula, M1-M3 cortex): 7 - Supraganglionic infarction (M4-M6 cortex): 7 Total score (0-10 with 10 being normal): 10 IMPRESSION: 1. No acute intracranial abnormality 2. ASPECTS is 10 3. Anterior communicating artery aneurysm clipping on the left with chronic left ACA infarct. Electronically Signed   By: Franchot Gallo M.D.   On: 11/09/2018 12:13    Procedures Procedures (including critical care time)  Medications Ordered in ED Medications  sodium chloride flush (NS) 0.9 % injection 3 mL (3 mLs Intravenous Given 11/09/18 1255)  iohexol (OMNIPAQUE) 350 MG/ML injection 50 mL (50 mLs Intravenous Contrast Given 11/09/18 1216)  levETIRAcetam (KEPPRA) IVPB 1000 mg/100 mL premix (0 mg Intravenous Stopped 11/09/18 1314)     Initial Impression / Assessment and Plan / ED Course  I have reviewed the triage vital signs and the nursing notes.  Pertinent labs & imaging results that were available during my care of the patient were reviewed by me and considered in my medical decision making (see chart for details).       Pt alert, following commands on arrival. Taken to CT scanner where head CT negative acute. CTA obtained to ensure no new changes at site of previous aneurysm. Labwork unremarkable.   Per neurologist, Dr. 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 Recommended keppra load and keppra 500mg  BID. PT to establish care with [facility].  She remains well-appearing on  reassessment after Keppra and states that she feels at baseline.  She understands driving restrictions, treatment course, and follow-up plan.  Return precautions reviewed.  Final Clinical Impressions(s) / ED Diagnoses   Final diagnoses:  None    ED Discharge Orders    None       Jayci Ellefson, Wenda Overland, MD 11/09/18 1438

## 2018-11-09 NOTE — Consult Note (Signed)
Neurology Consultation  Reason for Consult: Code stroke for right-sided weakness Referring Physician: Dr. Rex Kras  CC: Code stroke right-sided weakness  History is obtained from: EMT, chart  HPI: [name] [name] is a 33 y.o. 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 Patient was in her usual state of health till about 11:10 this morning when she suddenly stiffened up in bed when she was lying with her boyfriend and then had a right facial droop and slurred speech.  This lasted for a few minutes and she started to come around and was nearly baseline by the time she was brought in to the emergency room. Upon EMT arrival to her house, she was unable to name simple objects and was unable to follow commands consistently.  She also had right-sided hemiparesis. A code stroke was activated and she was brought in to the hospital.  She denies any severe headaches. She did say that she had a mild headache earlier but it resolved on her way to the hospital. Denies any preceding sicknesses or illnesses. Confirmed history with mother.   LKW: 11:10 AM on 11/09/2018 tpa given?: no, likely seizure Premorbid modified Rankin scale (mRS): 1   Past Medical History:  Diagnosis Date  . Asthma, allergic    uses inhaler prn - infrequently  . Bilateral thoracic back pain 09/13/2015  . Eczema   . Hypertension   . Lactose intolerance   . Mature cystic teratoma of ovary   . Mood swings 06/01/2015  . Seasonal allergies     Family History  Adopted: Yes  Problem Relation Age of Onset  . Hypertension Father   . Hypertension Maternal Aunt   . Hypertension Maternal Uncle   . Hypertension Maternal Grandmother   . Eczema  Maternal Grandmother   . Hypertension Maternal Grandfather   . Hypertension Paternal Grandmother   . Hypertension Mother   . Eczema Mother    Social History:   reports that she has been smoking cigarettes. She has been smoking about 0.25 packs per day. She has quit using smokeless tobacco. She reports that she does not drink alcohol or use drugs.  Medications  Current Facility-Administered Medications:  .  sodium chloride flush (NS) 0.9 % injection 3 mL, 3 mL, Intravenous, Once, Little, Wenda Overland, MD  Current Outpatient Medications:  .  acetaminophen (TYLENOL) 325 MG tablet, Take 1-2 tablets (325-650 mg total) by mouth every 4 (four) hours as needed for mild pain., Disp: , Rfl:  .  amLODipine (NORVASC) 10 MG tablet, Take 10 mg by mouth daily., Disp: , Rfl:  .  baclofen (LIORESAL) 20 MG tablet, Take 1 tablet (20 mg total) by mouth 3 (three) times daily., Disp: 30 each, Rfl: 0 .  bisacodyl (DULCOLAX) 10 MG suppository, Place 1 suppository (10 mg total) rectally daily as needed for moderate constipation., Disp: 12 suppository, Rfl: 0 .  cetirizine (ZYRTEC) 10 MG tablet, TAKE 1 TABLET BY MOUTH ONCE DAILY (Patient taking differently: Take 10 mg by mouth daily. ), Disp: 30 tablet, Rfl: 2 .  docusate sodium (COLACE) 100 MG capsule, Take 100 mg by mouth 2 (two) times daily., Disp: , Rfl:  .  famotidine (PEPCID) 20 MG tablet, Take 1 tablet (20 mg total) by mouth 2 (two) times daily., Disp: ,  Rfl:  .  feeding supplement, ENSURE ENLIVE, (ENSURE ENLIVE) LIQD, Take 237 mLs by mouth 2 (two) times daily between meals., Disp: 237 mL, Rfl: 12 .  fluticasone (FLONASE) 50 MCG/ACT nasal spray, Place 1 spray into both nostrils daily., Disp: , Rfl: 2 .  montelukast (SINGULAIR) 10 MG tablet, TAKE 1 TABLET BY MOUTH ONCE DAILY AT BEDTIME (Patient taking differently: Take 10 mg by mouth at bedtime. ), Disp: 30 tablet, Rfl: 2 .  Multiple Vitamin (MULTIVITAMIN WITH MINERALS) TABS tablet, Take 1 tablet by mouth  daily., Disp: , Rfl:  .  senna-docusate (SENOKOT-S) 8.6-50 MG tablet, Take 2 tablets by mouth 2 (two) times daily., Disp: , Rfl:  .  sodium chloride (OCEAN) 0.65 % SOLN nasal spray, Place 1 spray into both nostrils 2 (two) times daily before lunch and supper., Disp: , Rfl: 0  Exam: Current vital signs: BP 125/81 (BP Location: Right Arm)   Pulse 85   Temp 98.1 F (36.7 C) (Oral)   Resp 18   Ht 5\' 7"  (1.702 m)   Wt 97.1 kg   SpO2 100%   BMI 33.52 kg/m  Vital signs in last 24 hours: Temp:  [98.1 F (36.7 C)] 98.1 F (36.7 C) (05/23 1223) Pulse Rate:  [85] 85 (05/23 1223) Resp:  [18] 18 (05/23 1223) BP: (125)/(81) 125/81 (05/23 1223) SpO2:  [100 %] 100 % (05/23 1223) Weight:  [97.1 kg] 97.1 kg (05/23 1223)  GENERAL: Awake, alert in NAD HEENT: - Normocephalic and atraumatic, dry mm, no LN++, no Thyromegally LUNGS - Clear to auscultation bilaterally with no wheezes CV - S1S2 RRR, no m/r/g, equal pulses bilaterally. ABDOMEN - Soft, nontender, nondistended with normoactive BS Ext: warm, well perfused, intact peripheral pulses, no edema  NEURO:  Mental Status: AA&Ox3  Language: speech is non-dysarthric.  Naming, repetition, fluency, and comprehension intact. Cranial Nerves: PERRL_ EOMI, visual fields full, no facial asymmetry, facial sensation intact, hearing intact, tongue/uvula/soft palate midline, normal sternocleidomastoid and trapezius muscle strength. No evidence of tonge atrophy or fibrillations Motor: 4+/5 on the right upper extremity 4/5 in the right lower extremity.  Mild vertical drift in both right upper and lower extremity.  No drift in left upper or lower extremity. Tone: is normal and bulk is normal Sensation- Intact to light touch bilaterally Coordination:no gross ataxia but expected discoordination to weakness on the right side. Gait- deferred  NIHSS  2   Labs I have reviewed labs in epic and the results pertinent to this consultation are:  CBC    Component  Value Date/Time   WBC 4.2 11/09/2018 1156   RBC 4.14 11/09/2018 1156   HGB 13.9 11/09/2018 1206   HGB 11.0 (L) 09/25/2016 0902   HCT 41.0 11/09/2018 1206   HCT 33.9 (L) 09/25/2016 0902   PLT 203 11/09/2018 1156   PLT 198 09/25/2016 0902   MCV 101.2 (H) 11/09/2018 1156   MCV 98 (H) 09/25/2016 0902   MCH 32.1 11/09/2018 1156   MCHC 31.7 11/09/2018 1156   RDW 13.9 11/09/2018 1156   RDW 12.9 09/25/2016 0902   LYMPHSABS 2.3 11/09/2018 1156   MONOABS 0.3 11/09/2018 1156   EOSABS 0.1 11/09/2018 1156   BASOSABS 0.0 11/09/2018 1156    CMP     Component Value Date/Time   NA 140 11/09/2018 1206   NA 142 08/01/2017 0934   K 3.6 11/09/2018 1206   CL 110 11/09/2018 1206   CO2 16 (L) 11/09/2018 1156   GLUCOSE 98 11/09/2018 1206  GLUCOSE 70 08/25/2014 1214   BUN 4 (L) 11/09/2018 1206   BUN 6 08/01/2017 0934   CREATININE 0.50 11/09/2018 1206   CREATININE 0.40 (L) 06/29/2016 1504   CALCIUM 8.5 (L) 11/09/2018 1156   PROT 6.5 11/09/2018 1156   PROT 7.1 08/01/2017 0934   ALBUMIN 3.4 (L) 11/09/2018 1156   ALBUMIN 4.4 08/01/2017 0934   AST 25 11/09/2018 1156   ALT 21 11/09/2018 1156   ALKPHOS 79 11/09/2018 1156   BILITOT 0.2 (L) 11/09/2018 1156   BILITOT 0.4 08/01/2017 0934   GFRNONAA >60 11/09/2018 1156   GFRNONAA >89 07/02/2015 0945   GFRAA >60 11/09/2018 1156   GFRAA >89 07/02/2015 0945     Imaging I have reviewed the images obtained:  CT-scan of the brain -no acute changes.  Encephalomalacia from the left ACA stroke.  Status post ACA aneurysm clipping. I ordered a CTA head and neck- reviewed-Negative for emergent LVO CTA head and neck.  Postsurgical left ACA clipping.   Assessment: 33 year old woman with an a comm aneurysm related subarachnoid last year status post clipping of the aneurysm, ensuing right ACA strokes that led her with residual right-sided hemiparesis, presenting to the emergency room for evaluation of seizure-like activity.   Description of the events of  tensing of the body, gaze and decreased level of consciousness point towards seizure likely emanating from the area of damage cerebral tissue from the stroke. She is now back to baseline, if not completely then nearly completely. Given that she has structural lesion, she would require an antiepileptic going forward  Her head and neck vascular imaging is unremarkable for any new aneurysms or acute process.  Impression: New onset seizure History of right ACA stroke with encephalomalacia History of subarachnoid hemorrhage History of anterior communicating artery aneurysm status post clipping  Recommendations: Load with Keppra 1 g IV x1 now Start Keppra 500 twice daily p.o. going forward Maintain seizure precautions including no driving for [employer] as documented below. I had a detailed discussion about seizure precautions with her at the bedside.  She verbalized understanding. I would recommend observing her in the emergency room and if she feels she is back to baseline, she can be discharged home with the antiepileptic dose as above. If she feels that at this time she is not back to baseline, she should then be admitted for observation.  If her decision is made to admit her, at that time an EEG should also be performed. Otherwise the EEG can be deferred as an outpatient. She would need outpatient neurology follow-up in 4 to 6 weeks.   Seizure precautions Per [facility] statutes, patients with seizures are not allowed to drive until they have been seizure-free for six months.   Use caution when using heavy equipment or power tools. Avoid working on ladders or at heights. Take showers instead of baths. Ensure the water temperature is not too high on the home water heater. Do not go swimming alone. Do not lock yourself in a room alone (i.e. bathroom). When caring for infants or small children, sit down when holding, feeding, or changing them to minimize risk of injury to  the child in the event you have a seizure. Maintain good sleep hygiene. Avoid alcohol.   If patient has another seizure, call 911 and bring them back to the ED if: A. The seizure lasts longer than 5 minutes.  B. The patient doesn't wake shortly after the seizure or has new problems such as difficulty  seeing, speaking or moving following the seizure C. The patient was injured during the seizure D. The patient has a temperature over 102 F (39C) E. The patient vomited during the seizure and now is having trouble breathing  I discussed my plan in detail with Dr. Rex Kras at the bedside.  Neurology will be available as needed.  Please call with questions.   -- Amie Portland, MD [facility] Pager: [phone] If 7pm to 7am, please call on call as listed on AMION.

## 2018-11-09 NOTE — ED Triage Notes (Signed)
Pt BIB [facility] from home. Per EMS pt boyfriend woke her this morning and thought she was having some breathing problems and possibly a seizure. Upon EMS arrival, pt unable to name simple objects and right sided weakness.

## 2018-11-19 ENCOUNTER — Other Ambulatory Visit: Payer: Self-pay

## 2018-11-19 ENCOUNTER — Ambulatory Visit: Payer: Medicaid Other | Attending: Family Medicine | Admitting: Family Medicine

## 2018-11-19 ENCOUNTER — Encounter: Payer: Self-pay | Admitting: Family Medicine

## 2018-11-19 VITALS — BP 106/72 | HR 70 | Temp 98.4°F | Ht 67.0 in | Wt 213.0 lb

## 2018-11-19 DIAGNOSIS — I69351 Hemiplegia and hemiparesis following cerebral infarction affecting right dominant side: Secondary | ICD-10-CM | POA: Diagnosis not present

## 2018-11-19 DIAGNOSIS — M546 Pain in thoracic spine: Secondary | ICD-10-CM | POA: Insufficient documentation

## 2018-11-19 DIAGNOSIS — Z8249 Family history of ischemic heart disease and other diseases of the circulatory system: Secondary | ICD-10-CM | POA: Diagnosis not present

## 2018-11-19 DIAGNOSIS — Z79899 Other long term (current) drug therapy: Secondary | ICD-10-CM | POA: Diagnosis not present

## 2018-11-19 DIAGNOSIS — Z86718 Personal history of other venous thrombosis and embolism: Secondary | ICD-10-CM | POA: Insufficient documentation

## 2018-11-19 DIAGNOSIS — R531 Weakness: Secondary | ICD-10-CM | POA: Insufficient documentation

## 2018-11-19 DIAGNOSIS — R4701 Aphasia: Secondary | ICD-10-CM | POA: Insufficient documentation

## 2018-11-19 DIAGNOSIS — I10 Essential (primary) hypertension: Secondary | ICD-10-CM | POA: Diagnosis not present

## 2018-11-19 DIAGNOSIS — R569 Unspecified convulsions: Secondary | ICD-10-CM | POA: Insufficient documentation

## 2018-11-19 DIAGNOSIS — J45909 Unspecified asthma, uncomplicated: Secondary | ICD-10-CM | POA: Diagnosis not present

## 2018-11-19 MED ORDER — AMLODIPINE BESYLATE 10 MG PO TABS: 10.0000 mg | ORAL_TABLET | Freq: Every day | ORAL | 1 refills | Status: DC

## 2018-11-19 MED ORDER — LISINOPRIL 5 MG PO TABS: 5.0000 mg | ORAL_TABLET | Freq: Every day | ORAL | 1 refills | Status: DC

## 2018-11-19 NOTE — Progress Notes (Signed)
Subjective:  Patient ID: [name] [name], female    DOB: June 03, 1986  Age: 33 y.o. MRN: [id number]  CC: Hospitalization Follow-up   HPI [name] [name] is a 33 year old female with a history of asthma, hypertension, subarachnoid hemorrhage secondary to rupture of ACOM aneurysm status post embolization with left ACA CVA in 04/2018 with residual right spastic hemiparesis and expressive aphasia (improving) who presents today for follow-up visit.  Hospital course was complicated by right peroneal and posterior tibial vein DVT which has resolved.  She has residual right-sided weakness and has completed her sessions of physical therapy, seen by rehab Dr. Posey Pronto in 09/2018. She had an ED visit 1 week ago for seizure which occurred in her sleep.  CT head negative for acute intracranial process, CTA revealed no new changes at previous aneurysm site and as per neurology seizure was likely secondary to previous infarct.  She was treated with loading dose of Keppra and subsequently discharged on Keppra.  Since discharge she denies repeat seizure and has been compliant with her medications.  Her appointment with neurology comes up later this month. Her right-sided weakness has improved and so has her speech. She denies chest pain, pedal edema, dyspnea.  Past Medical History:  Diagnosis Date  . Asthma, allergic    uses inhaler prn - infrequently  . Bilateral thoracic back pain 09/13/2015  . Eczema   . Hypertension   . Lactose intolerance   . Mature cystic teratoma of ovary   . Mood swings 06/01/2015  . Seasonal allergies     Past Surgical History:  Procedure Laterality Date  . CRANIOTOMY N/A 05/10/2018   Procedure: 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;  Surgeon: Consuella Lose, MD;  Location: [place];  Service: Neurosurgery;  Laterality: N/A;  . IR 3D INDEPENDENT WKST  05/10/2018  . IR ANGIO INTRA EXTRACRAN SEL INTERNAL CAROTID BILAT MOD SED  05/10/2018   . IR ANGIO VERTEBRAL SEL VERTEBRAL BILAT MOD SED  05/10/2018  . RADIOLOGY WITH ANESTHESIA N/A 05/10/2018   Procedure: EMBOLIZATION OF ANEURSYM;  Surgeon: Consuella Lose, MD;  Location: [place];  Service: Radiology;  Laterality: N/A;  . TUBAL LIGATION N/A 12/15/2016   Procedure: POST PARTUM TUBAL LIGATION;  Surgeon: Mora Bellman, MD;  Location: [facility];  Service: Gynecology;  Laterality: N/A;  . WISDOM TOOTH EXTRACTION      Family History  Adopted: Yes  Problem Relation Age of Onset  . Hypertension Father   . Hypertension Maternal Aunt   . Hypertension Maternal Uncle   . Hypertension Maternal Grandmother   . Eczema Maternal Grandmother   . Hypertension Maternal Grandfather   . Hypertension Paternal Grandmother   . Hypertension Mother   . Eczema Mother     Allergies  Allergen Reactions  . Sulfa Antibiotics Swelling    Causes swelling on the face.  . Shellfish Allergy Other (See Comments)    Stomach upset    Outpatient Medications Prior to Visit  Medication Sig Dispense Refill  . acetaminophen (TYLENOL) 325 MG tablet Take 1-2 tablets (325-650 mg total) by mouth every 4 (four) hours as needed for mild pain.    . baclofen (LIORESAL) 20 MG tablet Take 1 tablet (20 mg total) by mouth 3 (three) times daily. 30 each 0  . cetirizine (ZYRTEC) 10 MG tablet TAKE 1 TABLET BY MOUTH ONCE DAILY (Patient taking differently: Take 10 mg by mouth daily. ) 30 tablet 2  . famotidine (PEPCID) 20 MG tablet Take 1 tablet (20 mg total) by  mouth 2 (two) times daily.    . fluticasone (FLONASE) 50 MCG/ACT nasal spray Place 1 spray into both nostrils daily.  2  . levETIRAcetam (KEPPRA) 500 MG tablet Take 1 tablet (500 mg total) by mouth 2 (two) times daily. 60 tablet 0  . montelukast (SINGULAIR) 10 MG tablet TAKE 1 TABLET BY MOUTH ONCE DAILY AT BEDTIME (Patient taking differently: Take 10 mg by mouth at bedtime. ) 30 tablet 2  . Multiple Vitamin (MULTIVITAMIN WITH MINERALS) TABS tablet Take 1 tablet by  mouth daily.    Marland Kitchen amLODipine (NORVASC) 10 MG tablet Take 10 mg by mouth daily.    . bisacodyl (DULCOLAX) 10 MG suppository Place 1 suppository (10 mg total) rectally daily as needed for moderate constipation. (Patient not taking: Reported on 11/19/2018) 12 suppository 0  . docusate sodium (COLACE) 100 MG capsule Take 100 mg by mouth 2 (two) times daily.    . feeding supplement, ENSURE ENLIVE, (ENSURE ENLIVE) LIQD Take 237 mLs by mouth 2 (two) times daily between meals. (Patient not taking: Reported on 11/19/2018) 237 mL 12  . senna-docusate (SENOKOT-S) 8.6-50 MG tablet Take 2 tablets by mouth 2 (two) times daily. (Patient not taking: Reported on 11/19/2018)    . sodium chloride (OCEAN) 0.65 % SOLN nasal spray Place 1 spray into both nostrils 2 (two) times daily before lunch and supper. (Patient not taking: Reported on 11/19/2018)  0   No facility-administered medications prior to visit.      ROS Review of Systems  Constitutional: Negative for activity change, appetite change and fatigue.  HENT: Negative for congestion, sinus pressure and sore throat.   Eyes: Positive for visual disturbance (needs corneal transplant).  Respiratory: Negative for cough, chest tightness, shortness of breath and wheezing.   Cardiovascular: Negative for chest pain and palpitations.  Gastrointestinal: Negative for abdominal distention, abdominal pain and constipation.  Endocrine: Negative for polydipsia.  Genitourinary: Negative for dysuria and frequency.  Musculoskeletal: Negative for arthralgias and back pain.  Skin: Negative for rash.  Neurological: Positive for weakness. Negative for tremors, light-headedness and numbness.  Hematological: Does not bruise/bleed easily.  Psychiatric/Behavioral: Negative for agitation and behavioral problems.    Objective:  BP 106/72   Pulse 70   Temp 98.4 F (36.9 C) (Oral)   Ht 5\' 7"  (1.702 m)   Wt 213 lb (96.6 kg)   SpO2 97%   BMI 33.36 kg/m   BP/Weight 11/19/2018  11/09/2018 7/82/9562  Systolic BP 130 865 -  Diastolic BP 72 85 -  Wt. (Lbs) 213 214 214  BMI 33.36 33.52 33.52      Physical Exam Constitutional:      Appearance: She is well-developed.  Cardiovascular:     Rate and Rhythm: Normal rate.     Heart sounds: Normal heart sounds. No murmur.  Pulmonary:     Effort: Pulmonary effort is normal.     Breath sounds: Normal breath sounds. No wheezing or rales.  Chest:     Chest wall: No tenderness.  Abdominal:     General: Bowel sounds are normal. There is no distension.     Palpations: Abdomen is soft. There is no mass.     Tenderness: There is no abdominal tenderness.  Musculoskeletal: Normal range of motion.  Neurological:     Mental Status: She is alert and oriented to person, place, and time.     CMP Latest Ref Rng & Units 11/09/2018 11/09/2018 06/24/2018  Glucose 70 - 99 mg/dL 98 104(H) 90  BUN  6 - 20 mg/dL 4(L) <5(L) 8  Creatinine 0.44 - 1.00 mg/dL 0.50 0.83 0.65  Sodium 135 - 145 mmol/L 140 136 138  Potassium 3.5 - 5.1 mmol/L 3.6 3.5 4.1  Chloride 98 - 111 mmol/L 110 106 107  CO2 22 - 32 mmol/L - 16(L) 23  Calcium 8.9 - 10.3 mg/dL - 8.5(L) 9.2  Total Protein 6.5 - 8.1 g/dL - 6.5 -  Total Bilirubin 0.3 - 1.2 mg/dL - 0.2(L) -  Alkaline Phos 38 - 126 U/L - 79 -  AST 15 - 41 U/L - 25 -  ALT 0 - 44 U/L - 21 -    Lipid Panel     Component Value Date/Time   CHOL 168 08/01/2017 0934   TRIG 133 05/13/2018 2230   HDL 53 08/01/2017 0934   CHOLHDL 3.2 08/01/2017 0934   CHOLHDL 4.4 07/02/2015 0945   VLDL 12 07/02/2015 0945   LDLCALC 105 (H) 08/01/2017 0934    CBC    Component Value Date/Time   WBC 4.2 11/09/2018 1156   RBC 4.14 11/09/2018 1156   HGB 13.9 11/09/2018 1206   HGB 11.0 (L) 09/25/2016 0902   HCT 41.0 11/09/2018 1206   HCT 33.9 (L) 09/25/2016 0902   PLT 203 11/09/2018 1156   PLT 198 09/25/2016 0902   MCV 101.2 (H) 11/09/2018 1156   MCV 98 (H) 09/25/2016 0902   MCH 32.1 11/09/2018 1156   MCHC 31.7  11/09/2018 1156   RDW 13.9 11/09/2018 1156   RDW 12.9 09/25/2016 0902   LYMPHSABS 2.3 11/09/2018 1156   MONOABS 0.3 11/09/2018 1156   EOSABS 0.1 11/09/2018 1156   BASOSABS 0.0 11/09/2018 1156    [name] Results  Component Value Date   HGBA1C 4.3 (L) 05/28/2018    Assessment & Plan:   1. Essential hypertension Controlled Counseled on blood pressure goal of less than 130/80, low-sodium, DASH diet, medication compliance, 150 minutes of moderate intensity exercise per week. Discussed medication compliance, adverse effects. - lisinopril (ZESTRIL) 5 MG tablet; Take 1 tablet (5 mg total) by mouth daily.  Dispense: 90 tablet; Refill: 1 - amLODipine (NORVASC) 10 MG tablet; Take 1 tablet (10 mg total) by mouth daily.  Dispense: 90 tablet; Refill: 1  2. 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 Seizure ([place]) Secondary to old infarct as per Neuro Continue Keppra Discussed [place] driving laws-no driving until 6 months seizure-free Keep upcoming appointment with neurology   Meds ordered this encounter  Medications  . lisinopril (ZESTRIL) 5 MG tablet    Sig: Take 1 tablet (5 mg total) by mouth daily.    Dispense:  90 tablet    Refill:  1  . amLODipine (NORVASC) 10 MG tablet    Sig: Take 1 tablet (10 mg total) by mouth daily.    Dispense:  90 tablet    Refill:  1    Follow-up: Return in about 1 month (around 12/19/2018) for PAP smear.       Charlott Rakes, MD, FAAFP. [facility] and [place], [place]   11/19/2018, 3:34 PM

## 2018-11-21 ENCOUNTER — Telehealth: Payer: Self-pay

## 2018-11-21 NOTE — Telephone Encounter (Signed)
Does patient qualify for PCS services. 

## 2018-11-22 NOTE — Telephone Encounter (Signed)
Yes. Stroke and right sided weakness. Thank you.

## 2018-12-09 ENCOUNTER — Telehealth: Payer: Self-pay | Admitting: Family Medicine

## 2018-12-09 ENCOUNTER — Encounter: Payer: Self-pay | Admitting: Physical Medicine & Rehabilitation

## 2018-12-09 ENCOUNTER — Encounter: Payer: Medicaid Other | Attending: Physical Medicine & Rehabilitation | Admitting: Physical Medicine & Rehabilitation

## 2018-12-09 ENCOUNTER — Other Ambulatory Visit: Payer: Self-pay

## 2018-12-09 VITALS — BP 131/89 | HR 83 | Temp 98.9°F | Ht 67.0 in | Wt 204.0 lb

## 2018-12-09 DIAGNOSIS — Z79899 Other long term (current) drug therapy: Secondary | ICD-10-CM | POA: Diagnosis not present

## 2018-12-09 DIAGNOSIS — M7120 Synovial cyst of popliteal space [Baker], unspecified knee: Secondary | ICD-10-CM | POA: Diagnosis not present

## 2018-12-09 DIAGNOSIS — F17210 Nicotine dependence, cigarettes, uncomplicated: Secondary | ICD-10-CM | POA: Diagnosis not present

## 2018-12-09 DIAGNOSIS — Z8249 Family history of ischemic heart disease and other diseases of the circulatory system: Secondary | ICD-10-CM | POA: Diagnosis not present

## 2018-12-09 DIAGNOSIS — R569 Unspecified convulsions: Secondary | ICD-10-CM

## 2018-12-09 DIAGNOSIS — R262 Difficulty in walking, not elsewhere classified: Secondary | ICD-10-CM | POA: Diagnosis not present

## 2018-12-09 DIAGNOSIS — I10 Essential (primary) hypertension: Secondary | ICD-10-CM | POA: Insufficient documentation

## 2018-12-09 DIAGNOSIS — R4701 Aphasia: Secondary | ICD-10-CM | POA: Diagnosis present

## 2018-12-09 DIAGNOSIS — S83289A Other tear of lateral meniscus, current injury, unspecified knee, initial encounter: Secondary | ICD-10-CM | POA: Diagnosis not present

## 2018-12-09 DIAGNOSIS — J45909 Unspecified asthma, uncomplicated: Secondary | ICD-10-CM | POA: Insufficient documentation

## 2018-12-09 DIAGNOSIS — I63522 Cerebral infarction due to unspecified occlusion or stenosis of left anterior cerebral artery: Secondary | ICD-10-CM | POA: Insufficient documentation

## 2018-12-09 DIAGNOSIS — Z993 Dependence on wheelchair: Secondary | ICD-10-CM | POA: Diagnosis not present

## 2018-12-09 DIAGNOSIS — K5900 Constipation, unspecified: Secondary | ICD-10-CM | POA: Insufficient documentation

## 2018-12-09 DIAGNOSIS — I82409 Acute embolism and thrombosis of unspecified deep veins of unspecified lower extremity: Secondary | ICD-10-CM | POA: Diagnosis not present

## 2018-12-09 DIAGNOSIS — R32 Unspecified urinary incontinence: Secondary | ICD-10-CM | POA: Insufficient documentation

## 2018-12-09 DIAGNOSIS — M1990 Unspecified osteoarthritis, unspecified site: Secondary | ICD-10-CM | POA: Diagnosis not present

## 2018-12-09 DIAGNOSIS — I6930 Unspecified sequelae of cerebral infarction: Secondary | ICD-10-CM | POA: Diagnosis not present

## 2018-12-09 NOTE — Progress Notes (Signed)
Subjective:    Patient ID: [name] [name], female    DOB: [birth year], 33 y.o.   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  Last clinic visit 10/09/2018. Family supplements history. Since that time, she went to the ED for seizures, notes reviewed.  She notes no seizures activities since then. She completed therapies, doing HEP. She denies knee pain.  BP is relatively controlled.  Spasticity has improved. Bowel movement and urination have normalized.  Tripped and fell on the bed.  She is no longer using a cane.  Expressive aphasia has essential returned to normal.  Pain Inventory Average Pain 0 Pain Right Now 0 My pain is sharp, dull, stabbing, tingling and aching  In the last 24 hours, has pain interfered with the following? General activity 0 Relation with others 0 Enjoyment of life 0 What TIME of day is your pain at its worst? na Sleep (in general) Good  Pain is worse with: inactivity Pain improves with: rest, heat/ice, therapy/exercise and pacing activities Relief from Meds: no pain medication  Mobility walk with assistance use a cane how many minutes can you walk? 10 ability to climb steps?  yes do you drive?  no  Function disabled: date disabled 11/2017  Neuro/Psych weakness trouble walking  Prior Studies Any changes since last visit?  no  Physicians involved in your care Any changes since last visit?  no   Family History  Adopted: Yes  Problem Relation Age of Onset  . Hypertension Father   . Hypertension Maternal Aunt   . Hypertension Maternal Uncle   . Hypertension Maternal Grandmother   . Eczema Maternal Grandmother   . Hypertension Maternal Grandfather   . Hypertension Paternal Grandmother   . Hypertension Mother   . Eczema Mother    Social History   Socioeconomic History  . Marital status: Single    Spouse  name: Not on file  . Number of children: Not on file  . Years of education: Not on file  . Highest education level: Not on file  Occupational History  . Not on file  Social Needs  . Financial resource strain: Not on file  . Food insecurity    Worry: Not on file    Inability: Not on file  . Transportation needs    Medical: Not on file    Non-medical: Not on file  Tobacco Use  . Smoking status: Current Every Day Smoker    Packs/day: 0.25    Types: Cigarettes  . Smokeless tobacco: Former Network engineer and Sexual Activity  . Alcohol use: No  . Drug use: No  . Sexual activity: Not Currently    Birth control/protection: None  Lifestyle  . Physical activity    Days per week: Not on file    Minutes per session: Not on file  . Stress: Not on file  Relationships  . Social Herbalist on phone: Not on file    Gets together: Not on file    Attends religious service: Not on file    Active member of club or organization: Not on file    Attends meetings of clubs or organizations: Not on file    Relationship status: Not on file  Other Topics Concern  . Not on file  Social History Narrative  . Not on file   Past Surgical History:  Procedure Laterality Date  .  CRANIOTOMY N/A 05/10/2018   Procedure: 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;  Surgeon: Consuella Lose, MD;  Location: [facility];  Service: Neurosurgery;  Laterality: N/A;  . IR 3D INDEPENDENT WKST  05/10/2018  . IR ANGIO INTRA EXTRACRAN SEL INTERNAL CAROTID BILAT MOD SED  05/10/2018  . IR ANGIO VERTEBRAL SEL VERTEBRAL BILAT MOD SED  05/10/2018  . RADIOLOGY WITH ANESTHESIA N/A 05/10/2018   Procedure: EMBOLIZATION OF ANEURSYM;  Surgeon: Consuella Lose, MD;  Location: [facility];  Service: Radiology;  Laterality: N/A;  . TUBAL LIGATION N/A 12/15/2016   Procedure: POST PARTUM TUBAL LIGATION;  Surgeon: Mora Bellman, MD;  Location: [facility];  Service: Gynecology;  Laterality: N/A;  .  WISDOM TOOTH EXTRACTION     Past Medical History:  Diagnosis Date  . Asthma, allergic    uses inhaler prn - infrequently  . Bilateral thoracic back pain 09/13/2015  . Eczema   . Hypertension   . Lactose intolerance   . Mature cystic teratoma of ovary   . Mood swings 06/01/2015  . Seasonal allergies    BP 131/89   Pulse 83   Temp 98.9 F (37.2 C)   Ht 5\' 7"  (1.702 m)   Wt 204 lb (92.5 kg)   LMP 12/09/2018   SpO2 97%   BMI 31.95 kg/m   Opioid Risk Score:   Fall Risk Score:  `1  Depression screen PHQ 2/9  Depression screen [facility] 2/9 08/07/2018 04/12/2018 10/25/2017 07/25/2017 02/02/2017 12/11/2016 12/07/2016  Decreased Interest 0 0 0 0 1 1 1   Down, Depressed, Hopeless 0 0 0 0 0 0 0  PHQ - 2 Score 0 0 0 0 1 1 1   Altered sleeping - 1 1 1  0 2 2  Tired, decreased energy - 1 2 1  0 1 2  Change in appetite - 0 0 0 0 0 0  Feeling bad or failure about yourself  - 0 0 0 0 0 0  Trouble concentrating - 0 1 0 1 0 0  Moving slowly or fidgety/restless - 0 0 0 0 0 1  Suicidal thoughts - 0 0 0 0 0 0  PHQ-9 Score - 2 4 2 2 4 6   Some recent data might be hidden   Review of Systems  Constitutional: Negative.   HENT: Negative.   Eyes: Negative.   Respiratory: Negative.   Cardiovascular: Negative.   Gastrointestinal: Negative.   Endocrine: Negative.   Genitourinary: Negative.   Musculoskeletal: Negative.   Skin: Negative.   Allergic/Immunologic: Positive for environmental allergies.  Neurological: Positive for weakness.  Hematological: Negative.   Psychiatric/Behavioral: Positive for confusion.  All other systems reviewed and are negative.      Objective:   Physical Exam Constitutional: No distress . Vital signs reviewed. HENT: Normocephalic.  Atraumatic. Eyes: EOMI. No discharge. Cardiovascular: No JVD. Respiratory: Normal effort. GI: Non-distended. Musc: Right ankle edema Neurological: She isalertand oriented Motor: LUE/LLE: 5/5 proximal to distal RUE: 4/5 proximal to  distal  RLE 4+/5 HF, KE, 4/5 ADF No increase in tone appreciated.  Good recall 3/3 Skin: Warm and dry. Intact. Psychiatric: She has anormal mood and affect. Herbehavior is normal.    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  1.  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  Completed HH therapies  Cont HEP  2. Pain Management:   Controlled, occasionally using Tylenol  Baker's cyst, lateral meniscal tear, and OA.    Improving  3. HTN:   Cont meds  Controlled today   4.  Spastic hemiplegia:   Improved  5. Gait abnormality  Improving  6. Seizures   Cont meds  Follow up with Neurology

## 2018-12-09 NOTE — Telephone Encounter (Signed)
Okay to stop lisinopril.  Please inquire about her blood pressure readings as her last blood pressure was 106/72 and if she remains at that level we will not need to substitute lisinopril with another antihypertensive. Thank you.

## 2018-12-09 NOTE — Telephone Encounter (Signed)
Patient called stating she has been coughing while taking lisinopril

## 2018-12-09 NOTE — Telephone Encounter (Signed)
Patients call returned.  Patient identified by name and date of birth.  Patient states she began coughing when she started lisinopril.  Patient told to stop lisinopril and PCP would be notified.  Information would be passed along as soon as it was received.  Patient acknowledged understanding of advice.

## 2018-12-09 NOTE — Telephone Encounter (Signed)
Patient called stating she would like to check on her CAPP paperwork. Patient states she did not receive paperwork in the mail. Please follow up.

## 2018-12-10 NOTE — Telephone Encounter (Signed)
Patients call returned.  Patient identified by name and date of birth.  Patient states that since she has stopped taking lisinipril her blood pressurses have risen but not to critical levels.  Last nights reading 135/82.  Patient informed Dr. Margarita Rana would be informed and any forthcoming information would be returned.  Patient acknowledged understanding of advice.

## 2018-12-12 NOTE — Telephone Encounter (Signed)
Paperwork has been faxed over to [facility]

## 2018-12-13 ENCOUNTER — Telehealth (INDEPENDENT_AMBULATORY_CARE_PROVIDER_SITE_OTHER): Payer: Self-pay | Admitting: Family Medicine

## 2018-12-13 NOTE — Telephone Encounter (Signed)
Patient called to request medication refill for   levETIRAcetam (KEPPRA) 500 MG tablet    baclofen (LIORESAL) 20 MG tablet    Patient uses:  [place] ([place]), [place] [facility] [place]   Please advise [phone]  Thank you Emmit Pomfret

## 2018-12-16 MED ORDER — LEVETIRACETAM 500 MG PO TABS: 500.0000 mg | ORAL_TABLET | Freq: Two times a day (BID) | ORAL | 0 refills | Status: DC

## 2018-12-16 MED ORDER — BACLOFEN 10 MG PO TABS: 10.0000 mg | ORAL_TABLET | Freq: Three times a day (TID) | ORAL | 3 refills | Status: DC

## 2018-12-16 NOTE — Telephone Encounter (Signed)
Refilled

## 2018-12-16 NOTE — Telephone Encounter (Signed)
Will route to PCP for review. 

## 2018-12-17 NOTE — Telephone Encounter (Signed)
Patient was called and informed of medication being sent to pharmacy. 

## 2018-12-19 ENCOUNTER — Ambulatory Visit: Payer: Medicaid Other | Admitting: Family Medicine

## 2019-01-01 ENCOUNTER — Other Ambulatory Visit: Payer: Self-pay

## 2019-01-01 ENCOUNTER — Encounter: Payer: Self-pay | Admitting: Diagnostic Neuroimaging

## 2019-01-01 ENCOUNTER — Ambulatory Visit (INDEPENDENT_AMBULATORY_CARE_PROVIDER_SITE_OTHER): Payer: Medicaid Other | Admitting: Diagnostic Neuroimaging

## 2019-01-01 VITALS — BP 133/94 | HR 89 | Temp 98.0°F | Ht 67.0 in | Wt 199.0 lb

## 2019-01-01 DIAGNOSIS — G40909 Epilepsy, unspecified, not intractable, without status epilepticus: Secondary | ICD-10-CM

## 2019-01-01 MED ORDER — LEVETIRACETAM 500 MG PO TABS: 500.0000 mg | ORAL_TABLET | Freq: Two times a day (BID) | ORAL | 4 refills | Status: DC

## 2019-01-01 NOTE — Progress Notes (Signed)
[facility]  PATIENT: [name] [name] DOB: Sep 28, 1985  REFERRING CLINICIAN: ER  HISTORY FROM: patient  REASON FOR VISIT: new consult    HISTORICAL  CHIEF COMPLAINT:  Chief Complaint  Patient presents with  . Seizures    rm 7, New Pt, :"seizure like activity during sleep"    HISTORY OF PRESENT ILLNESS:   33 year old female here for evaluation of seizure.  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  This was treated with craniotomy and clipping surgery procedure.  Hospital course was complicated with left ACA ischemic infarct.  Patient had resultant right-sided weakness.  Patient then transition to inpatient rehabilitation.  11/09/2018 patient had seizure in her sleep and went to the emergency room.  Apparently patient was having convulsions.  She was slightly confused after waking up.  CT and CTA scans were performed which showed no acute findings.  Patient was started on levetiracetam 5 mg twice a day and discharged home.  Since that time no further seizures.  No side effects of medication.   REVIEW OF SYSTEMS: Full 14 system review of systems performed and negative with exception of: As per HPI.  ALLERGIES: Allergies  Allergen Reactions  . Sulfa Antibiotics Swelling    Causes swelling on the face.  . Shellfish Allergy Other (See Comments)    Stomach upset    HOME MEDICATIONS: Outpatient Medications Prior to Visit  Medication Sig Dispense Refill  . acetaminophen (TYLENOL) 325 MG tablet Take 1-2 tablets (325-650 mg total) by mouth every 4 (four) hours as needed for mild pain.    Marland Kitchen amLODipine (NORVASC) 10 MG tablet Take 1 tablet (10 mg total) by mouth daily. 90 tablet 1  . cetirizine (ZYRTEC) 10 MG tablet TAKE 1 TABLET BY MOUTH ONCE DAILY (Patient taking differently: Take 10 mg by mouth daily. ) 30 tablet 2  . famotidine (PEPCID)  10 MG tablet Take 10 mg by mouth 2 (two) times daily.    . fluticasone (FLONASE) 50 MCG/ACT nasal spray Place 1 spray into both nostrils daily.  2  . levETIRAcetam (KEPPRA) 500 MG tablet Take 1 tablet (500 mg total) by mouth 2 (two) times daily. 60 tablet 0  . montelukast (SINGULAIR) 10 MG tablet TAKE 1 TABLET BY MOUTH ONCE DAILY AT BEDTIME (Patient taking differently: Take 10 mg by mouth at bedtime. ) 30 tablet 2  . Multiple Vitamin (MULTIVITAMIN WITH MINERALS) TABS tablet Take 1 tablet by mouth daily.    . sodium chloride (OCEAN) 0.65 % SOLN nasal spray Place 1 spray into both nostrils 2 (two) times daily before lunch and supper.  0  . baclofen (LIORESAL) 10 MG tablet Take 1 tablet (10 mg total) by mouth 3 (three) times daily. (Patient not taking: Reported on 01/01/2019) 90 tablet 3  . bisacodyl (DULCOLAX) 10 MG suppository Place 1 suppository (10 mg total) rectally daily as needed for moderate constipation. (Patient not taking: Reported on 01/01/2019) 12 suppository 0  . docusate sodium (COLACE) 100 MG capsule Take 100 mg by mouth 2 (two) times daily.    . feeding supplement, ENSURE ENLIVE, (ENSURE ENLIVE) LIQD Take 237 mLs by mouth 2 (two) times daily between meals. 237 mL 12  . lisinopril (ZESTRIL) 5 MG tablet Take 1 tablet (5 mg total) by mouth daily. 90 tablet 1  . senna-docusate (SENOKOT-S) 8.6-50 MG tablet Take 2 tablets by mouth 2 (two) times daily.     No facility-administered  medications prior to visit.     PAST MEDICAL HISTORY: Past Medical History:  Diagnosis Date  . Asthma, allergic    uses inhaler prn - infrequently  . Bilateral thoracic back pain 09/13/2015  . Eczema   . Hypertension   . Lactose intolerance   . Mature cystic teratoma of ovary   . Mood swings 06/01/2015  . Seasonal allergies   . Seizures ([place])   . Stroke ([place]) 04/2018   during aneurysm surgery    PAST SURGICAL HISTORY: Past Surgical History:  Procedure Laterality Date  . 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 IR 3D INDEPENDENT WKST  05/10/2018  . IR ANGIO INTRA EXTRACRAN SEL INTERNAL CAROTID BILAT MOD SED  05/10/2018  . IR ANGIO VERTEBRAL SEL VERTEBRAL BILAT MOD SED  05/10/2018  . RADIOLOGY WITH ANESTHESIA N/A 05/10/2018   Procedure: EMBOLIZATION OF ANEURSYM;  Surgeon: Consuella Lose, MD;  Location: [facility];  Service: Radiology;  Laterality: N/A;  . TUBAL LIGATION N/A 12/15/2016   Procedure: POST PARTUM TUBAL LIGATION;  Surgeon: Mora Bellman, MD;  Location: [facility];  Service: Gynecology;  Laterality: N/A;  . WISDOM TOOTH EXTRACTION      FAMILY HISTORY: Family History  Adopted: Yes  Problem Relation Age of Onset  . Hypertension Father   . Hypertension Maternal Aunt   . Hypertension Maternal Uncle   . Hypertension Maternal Grandmother   . Eczema Maternal Grandmother   . Hypertension Maternal Grandfather   . Hypertension Paternal Grandmother   . Hypertension Mother   . Eczema Mother     SOCIAL HISTORY: Social History   Socioeconomic History  . Marital status: Single    Spouse name: Not on file  . Number of children: 2  . Years of education: Not on file  . Highest education level: Some college, no degree  Occupational History  . Not on file  Social Needs  . Financial resource strain: Not on file  . Food insecurity    Worry: Not on file    Inability: Not on file  . Transportation needs    Medical: Not on file    Non-medical: Not on file  Tobacco Use  . Smoking status: Current Every Day Smoker    Packs/day: 0.25    Types: Cigarettes  . Smokeless tobacco: Former Network engineer and Sexual Activity  . Alcohol use: No  . Drug use: Yes    Types: Marijuana    Comment: used to yrs ago  . Sexual activity: Not Currently    Birth control/protection: None  Lifestyle  . Physical activity    Days  per week: Not on file    Minutes per session: Not on file  . Stress: Not on file  Relationships  . Social Herbalist on phone: Not on file    Gets together: Not on file    Attends religious service: Not on file    Active member of club or organization: Not on file    Attends meetings of clubs or organizations: Not on file    Relationship status: Not on file  . Intimate partner violence    Fear of current or ex partner: Not on file    Emotionally abused: Not on file    Physically abused: Not on file    Forced sexual activity: Not on file  Other Topics  Concern  . Not on file  Social History Narrative   Lives with 2 children   Coffee 2 cups daily     PHYSICAL EXAM  GENERAL EXAM/CONSTITUTIONAL: Vitals:  Vitals:   01/01/19 1117  BP: (!) 133/94  Pulse: 89  Temp: 98 F (36.7 C)  Weight: 199 lb (90.3 kg)  Height: 5\' 7"  (1.702 m)     Body mass index is 31.17 kg/m. Wt Readings from Last 3 Encounters:  01/01/19 199 lb (90.3 kg)  12/09/18 204 lb (92.5 kg)  11/19/18 213 lb (96.6 kg)     Patient is in no distress; well developed, nourished and groomed; neck is supple  CARDIOVASCULAR:  Examination of carotid arteries is normal; no carotid bruits  Regular rate and rhythm, no murmurs  Examination of peripheral vascular system by observation and palpation is normal  EYES:  Ophthalmoscopic exam of optic discs and posterior segments is normal; no papilledema or hemorrhages  No exam data present  MUSCULOSKELETAL:  Gait, strength, tone, movements noted in Neurologic exam below  NEUROLOGIC: MENTAL STATUS:  No flowsheet data found.  awake, alert, oriented to person, place and time  recent and remote memory intact  normal attention and concentration  language fluent, comprehension intact, naming intact  fund of knowledge appropriate  CRANIAL NERVE:   2nd - no papilledema on fundoscopic exam  2nd, 3rd, 4th, 6th - pupils equal and reactive to  light, visual fields full to confrontation, extraocular muscles intact, no nystagmus; DECR VISUAL ACUITY FROM KERATOCONUS  5th - facial sensation symmetric  7th - facial strength symmetric  8th - hearing intact  9th - palate elevates symmetrically, uvula midline  11th - shoulder shrug symmetric  12th - tongue protrusion midline  MOTOR:   normal bulk and tone, full strength in the BUE, BLE; EXCEPT RIGHT TRICEPS 4; RIGHT HIP FLEX 4; RIGHT DF 4+  SENSORY:   normal and symmetric to light touch, temperature, vibration  COORDINATION:   finger-nose-finger, fine finger movements normal; EXCEPT SLOW ON RIGHT SIDE  REFLEXES:   deep tendon reflexes present and symmetric  GAIT/STATION:   narrow based gait     DIAGNOSTIC DATA (LABS, IMAGING, TESTING) - I reviewed patient records, labs, notes, testing and imaging myself where available.  Lab Results  Component Value Date   WBC 4.2 11/09/2018   HGB 13.9 11/09/2018   HCT 41.0 11/09/2018   MCV 101.2 (H) 11/09/2018   PLT 203 11/09/2018      Component Value Date/Time   NA 140 11/09/2018 1206   NA 142 08/01/2017 0934   K 3.6 11/09/2018 1206   CL 110 11/09/2018 1206   CO2 16 (L) 11/09/2018 1156   GLUCOSE 98 11/09/2018 1206   GLUCOSE 70 08/25/2014 1214   BUN 4 (L) 11/09/2018 1206   BUN 6 08/01/2017 0934   CREATININE 0.50 11/09/2018 1206   CREATININE 0.40 (L) 06/29/2016 1504   CALCIUM 8.5 (L) 11/09/2018 1156   PROT 6.5 11/09/2018 1156   PROT 7.1 08/01/2017 0934   ALBUMIN 3.4 (L) 11/09/2018 1156   ALBUMIN 4.4 08/01/2017 0934   AST 25 11/09/2018 1156   ALT 21 11/09/2018 1156   ALKPHOS 79 11/09/2018 1156   BILITOT 0.2 (L) 11/09/2018 1156   BILITOT 0.4 08/01/2017 0934   GFRNONAA >60 11/09/2018 1156   GFRNONAA >89 07/02/2015 0945   GFRAA >60 11/09/2018 1156   GFRAA >89 07/02/2015 0945   Lab Results  Component Value Date   CHOL 168 08/01/2017   HDL  53 08/01/2017   LDLCALC 105 (H) 08/01/2017   TRIG 133 05/13/2018    CHOLHDL 3.2 08/01/2017   Lab Results  Component Value Date   HGBA1C 4.3 (L) 05/28/2018   No results found for: [id number] Lab Results  Component Value Date   TSH 0.82 07/20/2016      11/09/18 CT head  1. No acute intracranial abnormality 2. ASPECTS is 10 3. Anterior communicating artery aneurysm clipping on the left with chronic left ACA infarct.   11/09/18 CTA head / neck 1. Negative for emergent large vessel occlusion. 2. 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 No recurrent aneurysm. Occlusion of the left A2 segment just above the clip. Chronic left anterior cerebral artery infarct. 3. No significant carotid or vertebral artery stenosis in the neck.    ASSESSMENT AND PLAN  33 y.o. year old female here with new onset seizure May 2020, with history of subarachnoid hemorrhage from a comm aneurysm rupture in November 2019, status post craniotomy and clipping surgery, complicated by left ACA infarct.  Dx:  1. Seizure disorder ([place])      PLAN:  - continue levetiracetam 500mg  twice a day   - According to [place] law, you can not drive unless you are seizure / syncope free for at least 6 months and under physician's care.   - Please maintain precautions. Do not participate in activities where a loss of awareness could harm you or someone else. No swimming alone, no tub bathing, no hot tubs, no driving, no operating motorized vehicles (cars, ATVs, motocycles, etc), lawnmowers, power tools or firearms. No standing at heights, such as rooftops, ladders or stairs. Avoid hot objects such as stoves, heaters, open fires. Wear a helmet when riding a bicycle, scooter, skateboard, etc. and avoid areas of traffic. Set your water heater to 120 degrees or less.   Meds ordered this encounter  Medications  . levETIRAcetam (KEPPRA) 500 MG tablet    Sig: Take 1 tablet (500 mg total) by mouth 2 (two) times daily.    Dispense:  180 tablet    Refill:  4   Return in about 6 months  (around 07/04/2019) for with NP (Amy Lomax).    Penni Bombard, MD 7/56/4332, 95:18 AM Certified in Neurology, Neurophysiology and Neuroimaging  [facility] [address], [place], [place] [address] [phone]

## 2019-01-01 NOTE — Patient Instructions (Signed)
-   continue levetiracetam 500mg  twice a day   - According to [place] law, you can not drive unless you are seizure / syncope free for at least 6 months and under physician's care.   - Please maintain precautions. Do not participate in activities where a loss of awareness could harm you or someone else. No swimming alone, no tub bathing, no hot tubs, no driving, no operating motorized vehicles (cars, ATVs, motocycles, etc), lawnmowers, power tools or firearms. No standing at heights, such as rooftops, ladders or stairs. Avoid hot objects such as stoves, heaters, open fires. Wear a helmet when riding a bicycle, scooter, skateboard, etc. and avoid areas of traffic. Set your water heater to 120 degrees or less.

## 2019-01-13 ENCOUNTER — Encounter: Payer: Self-pay | Admitting: Family Medicine

## 2019-01-13 ENCOUNTER — Ambulatory Visit: Payer: Medicaid Other | Attending: Family Medicine | Admitting: Family Medicine

## 2019-01-13 ENCOUNTER — Other Ambulatory Visit: Payer: Self-pay

## 2019-01-13 ENCOUNTER — Other Ambulatory Visit (HOSPITAL_COMMUNITY)
Admission: RE | Admit: 2019-01-13 | Discharge: 2019-01-13 | Disposition: A | Discharge status: Home / Self Care | Payer: Medicaid Other | Source: Ambulatory Visit | Attending: Family Medicine | Admitting: Family Medicine

## 2019-01-13 VITALS — BP 114/75 | HR 70 | Temp 98.4°F | Ht 67.0 in | Wt 197.6 lb

## 2019-01-13 DIAGNOSIS — R4701 Aphasia: Secondary | ICD-10-CM | POA: Insufficient documentation

## 2019-01-13 DIAGNOSIS — Z79899 Other long term (current) drug therapy: Secondary | ICD-10-CM | POA: Diagnosis not present

## 2019-01-13 DIAGNOSIS — Z882 Allergy status to sulfonamides status: Secondary | ICD-10-CM | POA: Insufficient documentation

## 2019-01-13 DIAGNOSIS — J45909 Unspecified asthma, uncomplicated: Secondary | ICD-10-CM | POA: Insufficient documentation

## 2019-01-13 DIAGNOSIS — Z8673 Personal history of transient ischemic attack (TIA), and cerebral infarction without residual deficits: Secondary | ICD-10-CM | POA: Diagnosis not present

## 2019-01-13 DIAGNOSIS — Z86718 Personal history of other venous thrombosis and embolism: Secondary | ICD-10-CM | POA: Insufficient documentation

## 2019-01-13 DIAGNOSIS — I10 Essential (primary) hypertension: Secondary | ICD-10-CM | POA: Diagnosis not present

## 2019-01-13 DIAGNOSIS — Z124 Encounter for screening for malignant neoplasm of cervix: Secondary | ICD-10-CM | POA: Diagnosis not present

## 2019-01-13 DIAGNOSIS — Z1151 Encounter for screening for human papillomavirus (HPV): Secondary | ICD-10-CM | POA: Insufficient documentation

## 2019-01-13 DIAGNOSIS — G8111 Spastic hemiplegia affecting right dominant side: Secondary | ICD-10-CM | POA: Insufficient documentation

## 2019-01-13 DIAGNOSIS — R87810 Cervical high risk human papillomavirus (HPV) DNA test positive: Secondary | ICD-10-CM | POA: Insufficient documentation

## 2019-01-13 DIAGNOSIS — Z8249 Family history of ischemic heart disease and other diseases of the circulatory system: Secondary | ICD-10-CM | POA: Diagnosis not present

## 2019-01-13 NOTE — Patient Instructions (Signed)

## 2019-01-13 NOTE — Progress Notes (Signed)
Subjective:  Patient ID: [name] [name], female    DOB: May 04, 1986  Age: 33 y.o. 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 She has no vaginal discharge or urinary symptoms.  Past Medical History:  Diagnosis Date  . Asthma, allergic    uses inhaler prn - infrequently  . Bilateral thoracic back pain 09/13/2015  . Eczema   . Hypertension   . Lactose intolerance   . Mature cystic teratoma of ovary   . Mood swings 06/01/2015  . Seasonal allergies   . Seizures ([place])   . Stroke [facility]) 04/2018   during aneurysm surgery    Past Surgical History:  Procedure Laterality Date  . CRANIOTOMY N/A 05/10/2018   Procedure: 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;  Surgeon: Consuella Lose, MD;  Location: [place];  Service: Neurosurgery;  Laterality: N/A;  . IR 3D INDEPENDENT WKST  05/10/2018  . IR ANGIO INTRA EXTRACRAN SEL INTERNAL CAROTID BILAT MOD SED  05/10/2018  . IR ANGIO VERTEBRAL SEL VERTEBRAL BILAT MOD SED  05/10/2018  . RADIOLOGY WITH ANESTHESIA N/A 05/10/2018   Procedure: EMBOLIZATION OF ANEURSYM;  Surgeon: Consuella Lose, MD;  Location: [place];  Service: Radiology;  Laterality: N/A;  . TUBAL LIGATION N/A 12/15/2016   Procedure: POST PARTUM TUBAL LIGATION;  Surgeon: Mora Bellman, MD;  Location: [facility];  Service: Gynecology;  Laterality: N/A;  . WISDOM TOOTH EXTRACTION      Family History  Adopted: Yes  Problem Relation Age of Onset  . Hypertension Father   . Hypertension Maternal Aunt   . Hypertension Maternal Uncle   . Hypertension Maternal Grandmother   . Eczema Maternal  Grandmother   . Hypertension Maternal Grandfather   . Hypertension Paternal Grandmother   . Hypertension Mother   . Eczema Mother     Allergies  Allergen Reactions  . Sulfa Antibiotics Swelling    Causes swelling on the face.  . Shellfish Allergy Other (See Comments)    Stomach upset    Outpatient Medications Prior to Visit  Medication Sig Dispense Refill  . acetaminophen (TYLENOL) 325 MG tablet Take 1-2 tablets (325-650 mg total) by mouth every 4 (four) hours as needed for mild pain.    Marland Kitchen amLODipine (NORVASC) 10 MG tablet Take 1 tablet (10 mg total) by mouth daily. 90 tablet 1  . cetirizine (ZYRTEC) 10 MG tablet TAKE 1 TABLET BY MOUTH ONCE DAILY (Patient taking differently: Take 10 mg by mouth daily. ) 30 tablet 2  . famotidine (PEPCID) 10 MG tablet Take 10 mg by mouth 2 (two) times daily.    . fluticasone (FLONASE) 50 MCG/ACT nasal spray Place 1 spray into both nostrils daily.  2  . levETIRAcetam (KEPPRA) 500 MG tablet Take 1 tablet (500 mg total) by mouth 2 (two) times daily. 180 tablet 4  . montelukast (SINGULAIR) 10 MG tablet TAKE 1 TABLET BY MOUTH ONCE DAILY AT BEDTIME (Patient taking differently: Take 10 mg by mouth at bedtime. ) 30 tablet 2  . Multiple Vitamin (MULTIVITAMIN WITH MINERALS) TABS tablet Take 1 tablet by mouth daily.    . sodium chloride (OCEAN) 0.65 % SOLN nasal spray Place 1 spray into both nostrils 2 (  two) times daily before lunch and supper.  0  . baclofen (LIORESAL) 10 MG tablet Take 1 tablet (10 mg total) by mouth 3 (three) times daily. (Patient not taking: Reported on 01/01/2019) 90 tablet 3   No facility-administered medications prior to visit.      ROS Review of Systems  Constitutional: Negative for activity change, appetite change and fatigue.  HENT: Negative for congestion, sinus pressure and sore throat.   Eyes: Negative for visual disturbance.  Respiratory: Negative for cough, chest tightness, shortness of breath and wheezing.   Cardiovascular:  Negative for chest pain and palpitations.  Gastrointestinal: Negative for abdominal distention, abdominal pain and constipation.  Endocrine: Negative for polydipsia.  Genitourinary: Negative for dysuria and frequency.  Musculoskeletal: Negative for arthralgias and back pain.  Skin: Negative for rash.  Neurological: Negative for tremors, light-headedness and numbness.  Hematological: Does not bruise/bleed easily.  Psychiatric/Behavioral: Negative for agitation and behavioral problems.    Objective:  BP 114/75   Pulse 70   Temp 98.4 F (36.9 C) (Oral)   Ht 5\' 7"  (1.702 m)   Wt 197 lb 9.6 oz (89.6 kg)   SpO2 99%   BMI 30.95 kg/m   BP/Weight 01/13/2019 01/01/2019 6/43/3295  Systolic BP 188 416 606  Diastolic BP 75 94 89  Wt. (Lbs) 197.6 199 204  BMI 30.95 31.17 31.95      Physical Exam Constitutional:      Appearance: She is well-developed.  Cardiovascular:     Rate and Rhythm: Normal rate.     Heart sounds: Normal heart sounds. No murmur.  Pulmonary:     Effort: Pulmonary effort is normal.     Breath sounds: Normal breath sounds. No wheezing or rales.  Chest:     Chest wall: No tenderness.  Abdominal:     General: Bowel sounds are normal. There is no distension.     Palpations: Abdomen is soft. There is no mass.     Tenderness: There is no abdominal tenderness.  Genitourinary:    Comments: Right labia majora cyst, not tender 3x3cm Bloody discharge in vaginal and cervix Musculoskeletal: Normal range of motion.  Neurological:     Mental Status: She is alert and oriented to person, place, and time.     CMP Latest Ref Rng & Units 11/09/2018 11/09/2018 06/24/2018  Glucose 70 - 99 mg/dL 98 104(H) 90  BUN 6 - 20 mg/dL 4(L) <5(L) 8  Creatinine 0.44 - 1.00 mg/dL 0.50 0.83 0.65  Sodium 135 - 145 mmol/L 140 136 138  Potassium 3.5 - 5.1 mmol/L 3.6 3.5 4.1  Chloride 98 - 111 mmol/L 110 106 107  CO2 22 - 32 mmol/L - 16(L) 23  Calcium 8.9 - 10.3 mg/dL - 8.5(L) 9.2  Total  Protein 6.5 - 8.1 g/dL - 6.5 -  Total Bilirubin 0.3 - 1.2 mg/dL - 0.2(L) -  Alkaline Phos 38 - 126 U/L - 79 -  AST 15 - 41 U/L - 25 -  ALT 0 - 44 U/L - 21 -    Lipid Panel     Component Value Date/Time   CHOL 168 08/01/2017 0934   TRIG 133 05/13/2018 2230   HDL 53 08/01/2017 0934   CHOLHDL 3.2 08/01/2017 0934   CHOLHDL 4.4 07/02/2015 0945   VLDL 12 07/02/2015 0945   LDLCALC 105 (H) 08/01/2017 0934    CBC    Component Value Date/Time   WBC 4.2 11/09/2018 1156   RBC 4.14 11/09/2018 1156   HGB 13.9  11/09/2018 1206   HGB 11.0 (L) 09/25/2016 0902   HCT 41.0 11/09/2018 1206   HCT 33.9 (L) 09/25/2016 0902   PLT 203 11/09/2018 1156   PLT 198 09/25/2016 0902   MCV 101.2 (H) 11/09/2018 1156   MCV 98 (H) 09/25/2016 0902   MCH 32.1 11/09/2018 1156   MCHC 31.7 11/09/2018 1156   RDW 13.9 11/09/2018 1156   RDW 12.9 09/25/2016 0902   LYMPHSABS 2.3 11/09/2018 1156   MONOABS 0.3 11/09/2018 1156   EOSABS 0.1 11/09/2018 1156   BASOSABS 0.0 11/09/2018 1156    [name] Results  Component Value Date   HGBA1C 4.3 (L) 05/28/2018    Assessment & Plan:   1. Screening for cervical cancer Will be in touch with patient once results are obtained - Cytology - PAP([place])    No orders of the defined types were placed in this encounter.   Follow-up: Return in about 6 months (around 07/16/2019) for chronic medical conditions.       Charlott Rakes, MD, FAAFP. [facility] and [facility], [place]   01/13/2019, 11:26 AM

## 2019-01-16 LAB — CYTOLOGY - PAP
Diagnosis: NEGATIVE
HPV 16/18/45 genotyping: NEGATIVE
HPV: DETECTED — AB

## 2019-04-04 ENCOUNTER — Emergency Department (HOSPITAL_COMMUNITY)
Admission: EM | Admit: 2019-04-04 | Discharge: 2019-04-04 | Disposition: A | Discharge status: Home / Self Care | Payer: Medicaid Other | Attending: Emergency Medicine | Admitting: Emergency Medicine

## 2019-04-04 ENCOUNTER — Encounter (HOSPITAL_COMMUNITY): Payer: Self-pay | Admitting: Emergency Medicine

## 2019-04-04 ENCOUNTER — Emergency Department (HOSPITAL_COMMUNITY): Payer: Medicaid Other

## 2019-04-04 ENCOUNTER — Other Ambulatory Visit: Payer: Self-pay

## 2019-04-04 DIAGNOSIS — J45909 Unspecified asthma, uncomplicated: Secondary | ICD-10-CM | POA: Insufficient documentation

## 2019-04-04 DIAGNOSIS — I10 Essential (primary) hypertension: Secondary | ICD-10-CM | POA: Insufficient documentation

## 2019-04-04 DIAGNOSIS — F17210 Nicotine dependence, cigarettes, uncomplicated: Secondary | ICD-10-CM | POA: Insufficient documentation

## 2019-04-04 DIAGNOSIS — R569 Unspecified convulsions: Secondary | ICD-10-CM | POA: Insufficient documentation

## 2019-04-04 DIAGNOSIS — N3090 Cystitis, unspecified without hematuria: Secondary | ICD-10-CM | POA: Insufficient documentation

## 2019-04-04 DIAGNOSIS — N3000 Acute cystitis without hematuria: Secondary | ICD-10-CM

## 2019-04-04 DIAGNOSIS — Z79899 Other long term (current) drug therapy: Secondary | ICD-10-CM | POA: Diagnosis not present

## 2019-04-04 LAB — BASIC METABOLIC PANEL
Anion gap: 9 (ref 5–15)
BUN: 5 mg/dL — ABNORMAL LOW (ref 6–20)
CO2: 21 mmol/L — ABNORMAL LOW (ref 22–32)
Calcium: 8.9 mg/dL (ref 8.9–10.3)
Chloride: 108 mmol/L (ref 98–111)
Creatinine, Ser: 0.74 mg/dL (ref 0.44–1.00)
GFR calc Af Amer: >60 mL/min (ref >60)
GFR calc non Af Amer: >60 mL/min (ref >60)
Glucose, Bld: 94 mg/dL (ref 70–99)
Potassium: 3.8 mmol/L (ref 3.5–5.1)
Sodium: 138 mmol/L (ref 135–145)

## 2019-04-04 LAB — CBC WITH DIFFERENTIAL/PLATELET
Abs Immature Granulocytes: 0.01 10*3/uL (ref 0.00–0.07)
Basophils Absolute: 0 10*3/uL (ref 0.0–0.1)
Basophils Relative: 0 %
Eosinophils Absolute: 0 10*3/uL (ref 0.0–0.5)
Eosinophils Relative: 1 %
HCT: 40.3 % (ref 36.0–46.0)
Hemoglobin: 13.7 g/dL (ref 12.0–15.0)
Immature Granulocytes: 0 %
Lymphocytes Relative: 25 %
Lymphs Abs: 1 10*3/uL (ref 0.7–4.0)
MCH: 32.9 pg (ref 26.0–34.0)
MCHC: 34 g/dL (ref 30.0–36.0)
MCV: 96.9 fL (ref 80.0–100.0)
Monocytes Absolute: 0.3 10*3/uL (ref 0.1–1.0)
Monocytes Relative: 6 %
Neutro Abs: 2.6 10*3/uL (ref 1.7–7.7)
Neutrophils Relative %: 68 %
Platelets: 163 10*3/uL (ref 150–400)
RBC: 4.16 MIL/uL (ref 3.87–5.11)
RDW: 13.1 % (ref 11.5–15.5)
WBC: 3.9 10*3/uL — ABNORMAL LOW (ref 4.0–10.5)
nRBC: 0 % (ref 0.0–0.2)

## 2019-04-04 LAB — I-STAT BETA HCG BLOOD, ED (MC, WL, AP ONLY): I-stat hCG, quantitative: <5 m[IU]/mL (ref <5)

## 2019-04-04 LAB — URINALYSIS, ROUTINE W REFLEX MICROSCOPIC
Bilirubin Urine: NEGATIVE
Glucose, UA: NEGATIVE mg/dL
Hgb urine dipstick: NEGATIVE
Ketones, ur: 20 mg/dL — AB
Nitrite: NEGATIVE
Protein, ur: NEGATIVE mg/dL
Specific Gravity, Urine: 1.023 (ref 1.005–1.030)
pH: 5 (ref 5.0–8.0)

## 2019-04-04 LAB — CBG MONITORING, ED: Glucose-Capillary: 91 mg/dL (ref 70–99)

## 2019-04-04 MED ORDER — FOSFOMYCIN TROMETHAMINE 3 G PO PACK
3.0000 g | PACK | Freq: Once | ORAL | Status: AC
  Administered 2019-04-04: 3 g via ORAL
  Filled 2019-04-04: qty 3

## 2019-04-04 MED ORDER — SODIUM CHLORIDE 0.9 % IV BOLUS
1000.0000 mL | Freq: Once | INTRAVENOUS | Status: AC
  Administered 2019-04-04: 1000 mL via INTRAVENOUS

## 2019-04-04 NOTE — ED Notes (Signed)
Pt ambulatory to and from restroom with steady gait; pt unable to provide urine sample at this time

## 2019-04-04 NOTE — ED Notes (Signed)
Pt aware that a urine sample is needed.  

## 2019-04-04 NOTE — ED Provider Notes (Signed)
[place] EMERGENCY DEPARTMENT Provider Note   CSN: [id number] Arrival date & time: 04/04/19  1317     History   Chief Complaint Chief Complaint  Patient presents with  . Seizures    HPI Jaine Klingshirn is a 33 y.o. female.     The history is provided by the patient, medical records and a parent.  Seizures Seizure activity on arrival: no   Seizure type:  Grand mal Initial focality:  None Episode characteristics: generalized shaking   Episode characteristics: no confusion   Return to baseline: yes   Severity:  Mild Number of seizures this episode:  2 Progression:  Resolved Context: not alcohol withdrawal   Recent head injury:  No recent head injuries PTA treatment:  None History of seizures: yes   Similar to previous episodes: yes   Severity:  Mild Seizure control level:  Well controlled Current therapy:  Levetiracetam Compliance with current therapy:  Good   Past Medical History:  Diagnosis Date  . Asthma, allergic    uses inhaler prn - infrequently  . Bilateral thoracic back pain 09/13/2015  . Eczema   . Hypertension   . Lactose intolerance   . Mature cystic teratoma of ovary   . Mood swings 06/01/2015  . Seasonal allergies   . Seizures ([place])   . Stroke [facility]) 04/2018   during aneurysm surgery    Patient Active Problem List   Diagnosis Date Noted  . Late effect of cerebrovascular accident (CVA) 12/09/2018  . 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 Non-fluent aphasia   . Complex tear of lateral meniscus of right knee as current injury   . Primary osteoarthritis of right knee   . Knee pain, right   . Spastic hemiplegia of right dominant side as late effect of cerebral infarction ([place])   . Leukopenia   . Acute deep vein thrombosis (DVT) of right peroneal vein ([place])   . Essential hypertension   . Hyperglycemia   . SAH (subarachnoid hemorrhage) ([place])   . Acute cerebral infarction ([place])   . HCAP (healthcare-associated pneumonia)    . Benign essential HTN   . Acute blood loss anemia   . Right hemiplegia ([place])   . Subarachnoid hemorrhage from anterior communicating artery aneurysm ([place])   . Endotracheal tube present   . Brain aneurysm   . Subarachnoid hemorrhage ([place]) 05/09/2018  . Gastroesophageal reflux disease without esophagitis 10/09/2016  . BMI 31.0-31.9,adult 07/20/2016  . Obesity in pregnancy, antepartum 07/20/2016  . Tobacco abuse 06/01/2015  . Marijuana use 06/23/2014    Past Surgical History:  Procedure Laterality Date  . CRANIOTOMY N/A 05/10/2018   Procedure: 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;  Surgeon: Consuella Lose, MD;  Location: [place];  Service: Neurosurgery;  Laterality: N/A;  . IR 3D INDEPENDENT WKST  05/10/2018  . IR ANGIO INTRA EXTRACRAN SEL INTERNAL CAROTID BILAT MOD SED  05/10/2018  . IR ANGIO VERTEBRAL SEL VERTEBRAL BILAT MOD SED  05/10/2018  . RADIOLOGY WITH ANESTHESIA N/A 05/10/2018   Procedure: EMBOLIZATION OF ANEURSYM;  Surgeon: Consuella Lose, MD;  Location: [place];  Service: Radiology;  Laterality: N/A;  . TUBAL LIGATION N/A 12/15/2016   Procedure: POST PARTUM TUBAL LIGATION;  Surgeon: Mora Bellman, MD;  Location: [facility];  Service: Gynecology;  Laterality: N/A;  . WISDOM TOOTH EXTRACTION       OB History    Gravida  3   Para  3   Term  3   Preterm  0   AB  0   Living  3     SAB  0   TAB  0   Ectopic  0   Multiple  0   Live Births  3            Home Medications    Prior to Admission medications   Medication Sig Start Date End Date Taking? Authorizing Provider  amLODipine (NORVASC) 10 MG tablet Take 1 tablet (10 mg total) by mouth daily. 11/19/18  Yes Charlott Rakes, MD  baclofen (LIORESAL) 10 MG tablet Take 1 tablet (10 mg total) by mouth 3 (three) times daily. 12/16/18  Yes Charlott Rakes, MD  acetaminophen (TYLENOL) 325 MG tablet Take 1-2 tablets (325-650 mg total) by mouth every 4 (four) hours as  needed for mild pain. 06/24/18   Love, Ivan Anchors, PA-C  cetirizine (ZYRTEC) 10 MG tablet TAKE 1 TABLET BY MOUTH ONCE DAILY Patient taking differently: Take 10 mg by mouth daily.  03/07/18   Charlott Rakes, MD  famotidine (PEPCID) 10 MG tablet Take 10 mg by mouth 2 (two) times daily.    [provider]  fluticasone (FLONASE) 50 MCG/ACT nasal spray Place 1 spray into both nostrils daily. 06/25/18   Love, Ivan Anchors, PA-C  levETIRAcetam (KEPPRA) 500 MG tablet Take 1 tablet (500 mg total) by mouth 2 (two) times daily. 01/01/19   Penumalli, Earlean Polka, MD  montelukast (SINGULAIR) 10 MG tablet TAKE 1 TABLET BY MOUTH ONCE DAILY AT BEDTIME Patient taking differently: Take 10 mg by mouth at bedtime.  03/07/18   Charlott Rakes, MD  Multiple Vitamin (MULTIVITAMIN WITH MINERALS) TABS tablet Take 1 tablet by mouth daily. 06/25/18   Love, Ivan Anchors, PA-C  sodium chloride (OCEAN) 0.65 % SOLN nasal spray Place 1 spray into both nostrils 2 (two) times daily before lunch and supper. 06/24/18   Love, Ivan Anchors, PA-C    Family History Family History  Adopted: Yes  Problem Relation Age of Onset  . Hypertension Father   . Hypertension Maternal Aunt   . Hypertension Maternal Uncle   . Hypertension Maternal Grandmother   . Eczema Maternal Grandmother   . Hypertension Maternal Grandfather   . Hypertension Paternal Grandmother   . Hypertension Mother   . Eczema Mother     Social History Social History   Tobacco Use  . Smoking status: Current Every Day Smoker    Packs/day: 0.25    Types: Cigarettes  . Smokeless tobacco: Former Network engineer Use Topics  . Alcohol use: No  . Drug use: Yes    Types: Marijuana    Comment: used to yrs ago     Allergies   Sulfa antibiotics and Shellfish allergy   Review of Systems Review of Systems  Constitutional: Negative for chills and fatigue.  HENT: Negative for congestion.   Eyes: Negative for photophobia and visual disturbance.  Respiratory: Negative for cough,  chest tightness, shortness of breath, wheezing and stridor.   Cardiovascular: Negative for chest pain, palpitations and leg swelling.  Gastrointestinal: Negative for abdominal pain, constipation, diarrhea, nausea and vomiting.  Genitourinary: Negative for dysuria, flank pain and frequency.  Musculoskeletal: Negative for back pain, neck pain and neck stiffness.  Skin: Negative for rash and wound.  Neurological: Positive for seizures and weakness. Negative for dizziness, speech difficulty, light-headedness, numbness and headaches.  Psychiatric/Behavioral: Negative for agitation and confusion.  All other systems reviewed and are negative.    Physical Exam Updated Vital Signs BP 110/75  Pulse 60   Temp 98.2 F (36.8 C) (Oral)   Resp 18   SpO2 100%   Physical Exam Vitals signs and nursing note reviewed.  Constitutional:      General: She is not in acute distress.    Appearance: She is not ill-appearing, toxic-appearing or diaphoretic.  HENT:     Head: Normocephalic.     Nose: Nose normal. No congestion or rhinorrhea.     Mouth/Throat:     Mouth: Mucous membranes are moist.     Pharynx: No oropharyngeal exudate or posterior oropharyngeal erythema.  Eyes:     Conjunctiva/sclera: Conjunctivae normal.     Pupils: Pupils are equal, round, and reactive to light.  Neck:     Musculoskeletal: Normal range of motion. No muscular tenderness.  Cardiovascular:     Rate and Rhythm: Normal rate.     Pulses: Normal pulses.     Heart sounds: No murmur.  Pulmonary:     Effort: Pulmonary effort is normal. No respiratory distress.     Breath sounds: No wheezing, rhonchi or rales.  Chest:     Chest wall: No tenderness.  Abdominal:     General: Abdomen is flat. There is no distension.     Tenderness: There is no abdominal tenderness.  Musculoskeletal:        General: No tenderness.     Right lower leg: No edema.     Left lower leg: No edema.  Skin:    General: Skin is warm.     Capillary  Refill: Capillary refill takes less than 2 seconds.     Findings: No erythema.  Neurological:     Mental Status: She is alert and oriented to person, place, and time. Mental status is at baseline.     GCS: GCS eye subscore is 4. GCS verbal subscore is 5. GCS motor subscore is 6.     Cranial Nerves: Cranial nerves are intact. No cranial nerve deficit, dysarthria or facial asymmetry.     Sensory: No sensory deficit.     Motor: Weakness present.     Coordination: Coordination normal. Finger-Nose-Finger Test normal.     Comments: Right arm and right leg weakness with no numbness.  No other focal deficits on exam.  Psychiatric:        Mood and Affect: Mood normal.      ED Treatments / Results  Labs (all labs ordered are listed, but only abnormal results are displayed) Labs Reviewed  BASIC METABOLIC PANEL - Abnormal; Notable for the following components:      Result Value   CO2 21 (*)    BUN 5 (*)    All other components within normal limits  CBC WITH DIFFERENTIAL/PLATELET - Abnormal; Notable for the following components:   WBC 3.9 (*)    All other components within normal limits  URINALYSIS, ROUTINE W REFLEX MICROSCOPIC - Abnormal; Notable for the following components:   APPearance HAZY (*)    Ketones, ur 20 (*)    Leukocytes,Ua TRACE (*)    Bacteria, UA MANY (*)    All other components within normal limits  URINE CULTURE  CBG MONITORING, ED  I-STAT BETA HCG BLOOD, ED ([facility], [facility], [facility] ONLY)    EKG EKG Interpretation  Date/Time:  Friday April 04 2019 13:37:48 EDT Ventricular Rate:  69 PR Interval:  160 QRS Duration: 84 QT Interval:  416 QTC Calculation: 445 R Axis:   51 Text Interpretation:  Normal sinus rhythm Cannot rule out Anterior infarct ,  age undetermined Abnormal ECG When compared to prior, no significant cahgnes seen.  No STEMI Confirmed by Antony Blackbird [phone]) on 04/04/2019 4:23:08 PM   Radiology Ct Head Wo Contrast  Result Date: 04/04/2019 CLINICAL DATA:   Altered level of consciousness (LOC), unexplained hx SAh with aneurysm, 2 seizxure's this AM, EXAM: CT HEAD WITHOUT CONTRAST TECHNIQUE: Contiguous axial images were obtained from the base of the skull through the vertex without intravenous contrast. COMPARISON:  Most recent head CT 11/09/2018 FINDINGS: Brain: No acute intracranial hemorrhage. Specifically, no evidence of subarachnoid hemorrhage. Chronic infarct in the left anterior cerebral artery territory with associated ex vacuo dilatation of the left lateral ventricle. Aneurysm clip in the region of the anterior communicating artery. No evidence of acute ischemia. No hydrocephalus. Thin hyperdensities adjacent to left frontal craniotomy is presumed postsurgical and unchanged from prior. Vascular: Aneurysm clipping in the region of the anterior communicating artery. No hyperdense vessel. Skull: Prior left frontotemporal craniotomy. No acute findings. Sinuses/Orbits: No acute finding. Mastoid air cells are clear. Other: None. IMPRESSION: 1. No acute intracranial abnormality. 2. Prior anterior communicating artery aneurysm clipping with unchanged chronic left anterior cerebral artery territory infarct. Electronically Signed   By: Keith Rake M.D.   On: 04/04/2019 19:52    Procedures Procedures (including critical care time)  Medications Ordered in ED Medications  fosfomycin (MONUROL) packet 3 g (has no administration in time range)  sodium chloride 0.9 % bolus 1,000 mL (0 mLs Intravenous Stopped 04/04/19 1848)     Initial Impression / Assessment and Plan / ED Course  I have reviewed the triage vital signs and the nursing notes.  Pertinent labs & imaging results that were available during my care of the patient were reviewed by me and considered in my medical decision making (see chart for details).        Hannah Wisconsin is a 33 y.o. female with a past medical history significant for prior stroke related to subarachnoid hemorrhage and  brain aneurysm status post surgery with residual right-sided weakness and seizures who presents with 2 seizures.  Patient reportedly had a seizure and rolled out of the bed this morning.  Patient was trying to get up and had a second seizure witnessed by her husband lasting for none around time.  Patient is back to her baseline now.  She reports no recent fevers, chills, headache, neck pain, neck stiffness, numbness, tingling, weakness aside from baseline symptoms, vision changes, recent trauma, medication changes, feeling dehydrated, caffeine use, or sleep changes.  She denies any other complaints and is feeling normal now.  On exam, patient has weakness in her right arm and right leg which she reports is at her baseline unchanged.  No other focal neurologic deficits.  Lungs clear and chest nontender.  Abdomen nontender.  Patient resting comfortably with reassuring vital signs on arrival.  Clinically I suspect patient had 2 seizures based on the description of symptoms.  With her history of subarachnoid hemorrhage, surgery, and rule out of bed possible hitting her head, will get head CT and screening labs to look for obstructive balance, dehydration, or other infection.  If work-up is reassuring and patient demonstrate stability, patient will likely stable for discharge home for outpatient neurology close follow-up.      8:29 PM Patient's work-up began to return.  Patient does have leukocytes and bacteria with no squamous cells on her urine.  She is also has some ketones but otherwise labs are reassuring.  I suspect patient may have a  subtle UTI which lowered her seizure threshold.    Given this context, will give fosfomycin to treat for UTI and have her follow-up with the neurology team.  CT head was reassuring and patient was feeling well.  She had no episodes or symptoms in the 7 hours she was in the emergency department being observed.  Had a shared decision made conversation and talked about  different options.  We discussed consultation of neurology and further evaluation but they would rather call the neurology team tomorrow to discuss medication changes.  Otherwise, patient appeared well.  She will return if any symptoms change or worsen.  She no other questions or concerns and was discharged in good condition.      Final Clinical Impressions(s) / ED Diagnoses   Final diagnoses:  Seizure ([place])  Acute cystitis without hematuria    ED Discharge Orders    None      Clinical Impression: 1. Seizure ([place])   2. Acute cystitis without hematuria     Disposition: Discharge  Condition: Good  I have discussed the results, Dx and Tx plan with the pt(& family if present). He/she/they expressed understanding and agree(s) with the plan. Discharge instructions discussed at great length. Strict return precautions discussed and pt &/or family have verbalized understanding of the instructions. No further questions at time of discharge.    New Prescriptions   No medications on file    Follow Up: Your neurology team     Charlott Rakes, MD [place] [address] [place] [address] [id number] mc [place] [phone]       Socrates Cahoon, Gwenyth Allegra, MD 04/04/19 2033

## 2019-04-04 NOTE — ED Triage Notes (Addendum)
States had several sz this am ,  States she was asleep and it was witnessed by her friend , no incontinence has hx , is on nmeds for sz ,   Pt is aaox3

## 2019-04-04 NOTE — Discharge Instructions (Signed)
Your work-up today was overall reassuring aside from the possible UTI we discovered and the seizures this morning.  The CT scan did not show any acute changes from prior and you had no symptoms during the prolonged period of observation here in the emergency department.  Your other labs were reassuring.  We feel that you are safe to go home to speak with your neurology team tomorrow to discuss medication titration or changes and we treated you for the UTI.  Please stay hydrated and rest and follow-up.  If any symptoms change or worsen, please return to nearest emergency department.

## 2019-04-04 NOTE — ED Notes (Signed)
Pharmacy to send fosfomycin

## 2019-04-07 ENCOUNTER — Telehealth: Payer: Self-pay | Admitting: Diagnostic Neuroimaging

## 2019-04-07 MED ORDER — LEVETIRACETAM 1000 MG PO TABS: 1000.0000 mg | ORAL_TABLET | Freq: Two times a day (BID) | ORAL | 12 refills | Status: DC

## 2019-04-07 NOTE — Telephone Encounter (Signed)
Phone rep checked office voicemail; when pt was called she stated she had a seizure over the weekend and was told by the hospital to call her Neurologist.  Please call,  this voicemail was left @2 :22pm

## 2019-04-07 NOTE — Addendum Note (Signed)
Addended by: Andrey Spearman R on: 04/07/2019 05:48 PM   Modules accepted: Orders

## 2019-04-07 NOTE — Telephone Encounter (Signed)
Increase levetiracetam to 1000mg  twice a day. F/u in next 4-6 weeks with me or Amy.   Meds ordered this encounter  Medications  . levETIRAcetam (KEPPRA) 1000 MG tablet    Sig: Take 1 tablet (1,000 mg total) by mouth 2 (two) times daily.    Dispense:  60 tablet    Refill:  [place], MD [id number], 0000000 PM Certified in Neurology, Neurophysiology and Neuroimaging  [facility] [address] [place], [place] [address] [phone]

## 2019-04-08 LAB — URINE CULTURE

## 2019-04-08 NOTE — Telephone Encounter (Signed)
Spoke with patient and informed her that Dr Leta Baptist sent in new Rx for levetiracetam 1000 mg take one tab twice daily. He would like to have her come for FU in next 4-6 weeks. We scheduled FU with NP and she stated she'll get medicine today. Patient verbalized understanding, appreciation.

## 2019-05-07 ENCOUNTER — Other Ambulatory Visit: Payer: Self-pay

## 2019-05-07 ENCOUNTER — Encounter: Payer: Self-pay | Admitting: Family Medicine

## 2019-05-07 ENCOUNTER — Ambulatory Visit: Payer: Medicaid Other | Admitting: Family Medicine

## 2019-05-07 VITALS — BP 118/80 | HR 67 | Temp 98.0°F | Ht 67.0 in | Wt 177.2 lb

## 2019-05-07 DIAGNOSIS — Z8679 Personal history of other diseases of the circulatory system: Secondary | ICD-10-CM | POA: Diagnosis not present

## 2019-05-07 DIAGNOSIS — Z8673 Personal history of transient ischemic attack (TIA), and cerebral infarction without residual deficits: Secondary | ICD-10-CM

## 2019-05-07 DIAGNOSIS — G40909 Epilepsy, unspecified, not intractable, without status epilepticus: Secondary | ICD-10-CM | POA: Diagnosis not present

## 2019-05-07 NOTE — Progress Notes (Signed)
PATIENT: [name] [name] DOB: 1985/07/20  REASON FOR VISIT: follow up HISTORY FROM: patient  Chief Complaint  Patient presents with  . Follow-up    New room, with her mother. Had a 2 seizures within the last month.     HISTORY OF PRESENT ILLNESS: Today 05/07/19 [name] [name] is a 33 y.o. 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 In 10/2018, patient suffered a seizure in her sleep. She was started on levetiracetam 500mg  twice daily. She has tolerated medication without obvious adverse effects. Unfortunately, she had a breakthrough seizure on 10/16. ER workup concerning for UTI. Dr Leta Baptist increased levetiracetam to 1000mg  twice daily. She is tolerating dose increase well and denies seizure activity. She takes medication daily at 10am and 10pm. No missed doses. She is followed closely by PCP. Blood pressures have been normal. No stroke like symptoms. She has residual right sided weakness that is improving with time. She does not drive. She has two small children. She lives with her significant other. She has a great support system. Her mother accompanies her today.   HISTORY: (copied from Dr Gladstone Lighter note on 01/01/2019)  33 year old female here for evaluation of seizure.  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  This was treated with craniotomy and clipping surgery procedure.  Hospital course was complicated with left ACA ischemic infarct.  Patient had resultant right-sided weakness.  Patient then transition to inpatient rehabilitation.  11/09/2018 patient had seizure in her sleep and went to the emergency room.  Apparently patient was having  convulsions.  She was slightly confused after waking up.  CT and CTA scans were performed which showed no acute findings.  Patient was started on levetiracetam 5 mg twice a day and discharged home.  Since that time no further seizures.  No side effects of medication.   REVIEW OF SYSTEMS: Out of a complete 14 system review of symptoms, the patient complains only of the following symptoms, none and all other reviewed systems are negative.  ALLERGIES: Allergies  Allergen Reactions  . Sulfa Antibiotics Swelling    Causes swelling on the face.  . Shellfish Allergy Other (See Comments)    Stomach upset    HOME MEDICATIONS: Outpatient Medications Prior to Visit  Medication Sig Dispense Refill  . acetaminophen (TYLENOL) 325 MG tablet Take 1-2 tablets (325-650 mg total) by mouth every 4 (four) hours as needed for mild pain.    Marland Kitchen amLODipine (NORVASC) 10 MG tablet Take 1 tablet (10 mg total) by mouth daily. 90 tablet 1  . baclofen (LIORESAL) 10 MG tablet Take 1 tablet (10 mg total) by mouth 3 (three) times daily. 90 tablet 3  . cetirizine (ZYRTEC) 10 MG tablet TAKE 1 TABLET BY MOUTH ONCE DAILY (Patient taking differently: Take 10 mg by mouth daily. ) 30 tablet 2  . fluticasone (FLONASE) 50 MCG/ACT nasal spray Place 1 spray into both nostrils daily.  2  . levETIRAcetam (KEPPRA) 1000 MG tablet Take 1 tablet (1,000 mg total) by mouth 2 (two) times daily. 60 tablet 12  . sodium chloride (OCEAN) 0.65 % SOLN nasal spray Place 1 spray into both nostrils 2 (two) times daily before lunch and supper.  0  . montelukast (SINGULAIR) 10 MG  tablet TAKE 1 TABLET BY MOUTH ONCE DAILY AT BEDTIME (Patient not taking: No sig reported) 30 tablet 2  . Multiple Vitamin (MULTIVITAMIN WITH MINERALS) TABS tablet Take 1 tablet by mouth daily. (Patient not taking: Reported on 05/07/2019)     No facility-administered medications prior to visit.     PAST MEDICAL HISTORY: Past Medical History:  Diagnosis Date  . Asthma,  allergic    uses inhaler prn - infrequently  . Bilateral thoracic back pain 09/13/2015  . Eczema   . Hypertension   . Lactose intolerance   . Mature cystic teratoma of ovary   . Mood swings 06/01/2015  . Seasonal allergies   . Seizures ([place])   . Stroke ([place]) 04/2018   during aneurysm surgery    PAST SURGICAL HISTORY: Past Surgical History:  Procedure Laterality Date  . CRANIOTOMY N/A 05/10/2018   Procedure: 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;  Surgeon: Consuella Lose, MD;  Location: [facility];  Service: Neurosurgery;  Laterality: N/A;  . IR 3D INDEPENDENT WKST  05/10/2018  . IR ANGIO INTRA EXTRACRAN SEL INTERNAL CAROTID BILAT MOD SED  05/10/2018  . IR ANGIO VERTEBRAL SEL VERTEBRAL BILAT MOD SED  05/10/2018  . RADIOLOGY WITH ANESTHESIA N/A 05/10/2018   Procedure: EMBOLIZATION OF ANEURSYM;  Surgeon: Consuella Lose, MD;  Location: [facility];  Service: Radiology;  Laterality: N/A;  . TUBAL LIGATION N/A 12/15/2016   Procedure: POST PARTUM TUBAL LIGATION;  Surgeon: Mora Bellman, MD;  Location: [facility];  Service: Gynecology;  Laterality: N/A;  . WISDOM TOOTH EXTRACTION      FAMILY HISTORY: Family History  Adopted: Yes  Problem Relation Age of Onset  . Hypertension Father   . Hypertension Maternal Aunt   . Hypertension Maternal Uncle   . Hypertension Maternal Grandmother   . Eczema Maternal Grandmother   . Hypertension Maternal Grandfather   . Hypertension Paternal Grandmother   . Hypertension Mother   . Eczema Mother     SOCIAL HISTORY: Social History   Socioeconomic History  . Marital status: Single    Spouse name: Not on file  . Number of children: 2  . Years of education: Not on file  . Highest education level: Some college, no degree  Occupational History  . Not on file  Social Needs  . Financial resource strain: Not on file  . Food insecurity    Worry: Not on file    Inability: Not on file  . Transportation needs     Medical: Not on file    Non-medical: Not on file  Tobacco Use  . Smoking status: Current Every Day Smoker    Packs/day: 0.25    Types: Cigarettes  . Smokeless tobacco: Former Network engineer and Sexual Activity  . Alcohol use: No  . Drug use: Yes    Types: Marijuana    Comment: used to yrs ago  . Sexual activity: Not Currently    Birth control/protection: None  Lifestyle  . Physical activity    Days per week: Not on file    Minutes per session: Not on file  . Stress: Not on file  Relationships  . Social Herbalist on phone: Not on file    Gets together: Not on file    Attends religious service: Not on file    Active member of club or organization: Not on file    Attends meetings of clubs or organizations: Not on file    Relationship status: Not on  file  . Intimate partner violence    Fear of current or ex partner: Not on file    Emotionally abused: Not on file    Physically abused: Not on file    Forced sexual activity: Not on file  Other Topics Concern  . Not on file  Social History Narrative   Lives with 2 children   Coffee 2 cups daily      PHYSICAL EXAM  Vitals:   05/07/19 1259  BP: 118/80  Pulse: 67  Temp: 98 F (36.7 C)  Weight: 177 lb 3.2 oz (80.4 kg)  Height: 5\' 7"  (1.702 m)   Body mass index is 27.75 kg/m.  Generalized: Well developed, in no acute distress  Cardiology: normal rate and rhythm, no murmur noted Neurological examination  Mentation: Alert oriented to time, place, history taking. Follows all commands speech and language fluent Cranial nerve II-XII: Pupils were equal round reactive to light. Extraocular movements were full, visual field were full on confrontational test. Facial sensation and strength were normal. Uvula tongue midline. Head turning and shoulder shrug  were normal and symmetric. Motor: The motor testing reveals 5 over 5 strength of left upper and lower extremities. 4+/5 of left upper and lower extremity. Good  symmetric motor tone is noted throughout.  Sensory: Sensory testing is intact to soft touch on all 4 extremities. No evidence of extinction is noted.  Coordination: Cerebellar testing reveals good finger-nose-finger and heel-to-shin bilaterally.  Gait and station: Gait is normal.  DIAGNOSTIC DATA (LABS, IMAGING, TESTING) - I reviewed patient records, labs, notes, testing and imaging myself where available.  No flowsheet data found.   Lab Results  Component Value Date   WBC 3.9 (L) 04/04/2019   HGB 13.7 04/04/2019   HCT 40.3 04/04/2019   MCV 96.9 04/04/2019   PLT 163 04/04/2019      Component Value Date/Time   NA 138 04/04/2019 1400   NA 142 08/01/2017 0934   K 3.8 04/04/2019 1400   CL 108 04/04/2019 1400   CO2 21 (L) 04/04/2019 1400   GLUCOSE 94 04/04/2019 1400   GLUCOSE 70 08/25/2014 1214   BUN 5 (L) 04/04/2019 1400   BUN 6 08/01/2017 0934   CREATININE 0.74 04/04/2019 1400   CREATININE 0.40 (L) 06/29/2016 1504   CALCIUM 8.9 04/04/2019 1400   PROT 6.5 11/09/2018 1156   PROT 7.1 08/01/2017 0934   ALBUMIN 3.4 (L) 11/09/2018 1156   ALBUMIN 4.4 08/01/2017 0934   AST 25 11/09/2018 1156   ALT 21 11/09/2018 1156   ALKPHOS 79 11/09/2018 1156   BILITOT 0.2 (L) 11/09/2018 1156   BILITOT 0.4 08/01/2017 0934   GFRNONAA >60 04/04/2019 1400   GFRNONAA >89 07/02/2015 0945   GFRAA >60 04/04/2019 1400   GFRAA >89 07/02/2015 0945   Lab Results  Component Value Date   CHOL 168 08/01/2017   HDL 53 08/01/2017   LDLCALC 105 (H) 08/01/2017   TRIG 133 05/13/2018   CHOLHDL 3.2 08/01/2017   Lab Results  Component Value Date   HGBA1C 4.3 (L) 05/28/2018   No results found for: [id number] Lab Results  Component Value Date   TSH 0.82 07/20/2016       ASSESSMENT AND PLAN 33 y.o. year old female  has a past medical history of Asthma, allergic, Bilateral thoracic back pain (09/13/2015), Eczema, Hypertension, Lactose intolerance, Mature cystic teratoma of ovary, Mood swings  (06/01/2015), Seasonal allergies, Seizures ([place]), and Stroke ([place]) (04/2018). here with     ICD-10-CM  1. Seizure disorder ([place])  G40.909   2. History of spontaneous subarachnoid intracranial hemorrhage  Z86.79   3. H/O ischemic left ACA stroke  Z71.73     Malani has had two breakthrough seizures since last being seen, most recently about a month ago. She is tolerating increased dose of levetiracetam. We will continue levetiracetam 1000mg  twice daily. She was advised to take medication every 12 hours. Adequate hydration discussed. She will continue close follow up with PCP for stroke prevention. BP at goal. She will follow up with me in 6 months, sooner if needed. She verbalizes understanding and agreement with this plan.    No orders of the defined types were placed in this encounter.    No orders of the defined types were placed in this encounter.     I spent 15 minutes with the patient. 50% of this time was spent counseling and educating patient on plan of care and medications.    Debbora Presto, FNP-C 05/07/2019, 1:05 PM [facility] [address], [place], [place] [address] [phone]

## 2019-05-07 NOTE — Patient Instructions (Addendum)
Continue levetiracetam 1000mg  twice daily  According to [place] law, you can not drive unless you are seizure / syncope free for at least 6 months and under physician's care.  Please maintain precautions. Do not participate in activities where a loss of awareness could harm you or someone else. No swimming alone, no tub bathing, no hot tubs, no driving, no operating motorized vehicles (cars, ATVs, motocycles, etc), lawnmowers, power tools or firearms. No standing at heights, such as rooftops, ladders or stairs. Avoid hot objects such as stoves, heaters, open fires. Wear a helmet when riding a bicycle, scooter, skateboard, etc. and avoid areas of traffic. Set your water heater to 120 degrees or less.   Follow up with PCP closely for HTN, goal < 130/80.   Follow up with me in 6 months, sooner if needed.   Seizure, Adult A seizure is a sudden burst of abnormal electrical activity in the brain. Seizures usually last from 30 seconds to 2 minutes. They can cause many different symptoms. Usually, seizures are not harmful unless they last a long time. What are the causes? Common causes of this condition include:  Fever or infection.  Conditions that affect the brain, such as: ? A brain abnormality that you were born with. ? A brain or head injury. ? Bleeding in the brain. ? A tumor. ? Stroke. ? Brain disorders such as autism or cerebral palsy.  Low blood sugar.  Conditions that are passed from parent to child (are inherited).  Problems with substances, such as: ? Having a reaction to a drug or a medicine. ? Suddenly stopping the use of a substance (withdrawal). In some cases, the cause may not be known. A person who has repeated seizures over time without a clear cause has a condition called epilepsy. What increases the risk? You are more likely to get this condition if you have:  A family history of epilepsy.  Had a seizure in the past.  A brain disorder.  A history of head injury,  lack of oxygen at birth, or strokes. What are the signs or symptoms? There are many types of seizures. The symptoms vary depending on the type of seizure you have. Examples of symptoms during a seizure include:  Shaking (convulsions).  Stiffness in the body.  Passing out (losing consciousness).  Head nodding.  Staring.  Not responding to sound or touch.  Loss of bladder control and bowel control. Some people have symptoms right before and right after a seizure happens. Symptoms before a seizure may include:  Fear.  Worry (anxiety).  Feeling like you may vomit (nauseous).  Feeling like the room is spinning (vertigo).  Feeling like you saw or heard something before (dj vu).  Odd tastes or smells.  Changes in how you see. You may see flashing lights or spots. Symptoms after a seizure happens can include:  Confusion.  Sleepiness.  Headache.  Weakness on one side of the body. How is this treated? Most seizures will stop on their own in under 5 minutes. In these cases, no treatment is needed. Seizures that last longer than 5 minutes will usually need treatment. Treatment can include:  Medicines given through an IV tube.  Avoiding things that are known to cause your seizures. These can include medicines that you take for another condition.  Medicines to treat epilepsy.  Surgery to stop the seizures. This may be needed if medicines do not help. Follow these instructions at home: Medicines  Take over-the-counter and prescription medicines only as  told by your doctor.  Do not eat or drink anything that may keep your medicine from working, such as alcohol. Activity  Do not do any activities that would be dangerous if you had another seizure, like driving or swimming. Wait until your doctor says it is safe for you to do them.  If you live in the [place] ask your local DMV (department of motor vehicles) when you can drive.  Get plenty of rest. Teaching  others Teach friends and family what to do when you have a seizure. They should:  Lay you on the ground.  Protect your head and body.  Loosen any tight clothing around your neck.  Turn you on your side.  Not hold you down.  Not put anything into your mouth.  Know whether or not you need emergency care.  Stay with you until you are better.  General instructions  Contact your doctor each time you have a seizure.  Avoid anything that gives you seizures.  Keep a seizure diary. Write down: ? What you think caused each seizure. ? What you remember about each seizure.  Keep all follow-up visits as told by your doctor. This is important. Contact a doctor if:  You have another seizure.  You have seizures more often.  There is any change in what happens during your seizures.  You keep having seizures with treatment.  You have symptoms of being sick or having an infection. Get help right away if:  You have a seizure that: ? Lasts longer than 5 minutes. ? Is different than seizures you had before. ? Makes it harder to breathe. ? Happens after you hurt your head.  You have any of these symptoms after a seizure: ? Not being able to speak. ? Not being able to use a part of your body. ? Confusion. ? A bad headache.  You have two or more seizures in a row.  You do not wake up right after a seizure.  You get hurt during a seizure. These symptoms may be an emergency. Do not wait to see if the symptoms will go away. Get medical help right away. Call your local emergency services (911 in the [place] Do not drive yourself to the hospital. Summary  Seizures usually last from 30 seconds to 2 minutes. Usually, they are not harmful unless they last a long time.  Do not eat or drink anything that may keep your medicine from working, such as alcohol.  Teach friends and family what to do when you have a seizure.  Contact your doctor each time you have a seizure. This  information is not intended to replace advice given to you by your health care provider. Make sure you discuss any questions you have with your health care provider. Document Released: 11/22/2007 Document Revised: 08/23/2018 Document Reviewed: 08/23/2018 Elsevier Patient Education  [place].   Stroke Prevention Some medical conditions and lifestyle choices can lead to a higher risk for a stroke. You can help to prevent a stroke by making nutrition, lifestyle, and other changes. What nutrition changes can be made?   Eat healthy foods. ? Choose foods that are high in fiber. These include:  Fresh fruits.  Fresh vegetables.  Whole grains. ? Eat at least 5 or more servings of fruits and vegetables each day. Try to fill half of your plate at each meal with fruits and vegetables. ? Choose lean protein foods. These include:  Lowfat (lean) cuts of meat.  Chicken without skin.  Fish.  Tofu.  Beans.  Nuts. ? Eat low-fat dairy products. ? Avoid foods that:  Are high in salt (sodium).  Have saturated fat.  Have trans fat.  Have cholesterol.  Are processed.  Are premade.  Follow eating guidelines as told by your doctor. These may include: ? Reducing how many calories you eat and drink each day. ? Limiting how much salt you eat or drink each day to 1,500 milligrams (mg). ? Using only healthy fats for cooking. These include:  Olive oil.  Canola oil.  Sunflower oil. ? Counting how many carbohydrates you eat and drink each day. What lifestyle changes can be made?  Try to stay at a healthy weight. Talk to your doctor about what a good weight is for you.  Get at least 30 minutes of moderate physical activity at least 5 days a week. This can include: ? Fast walking. ? Biking. ? Swimming.  Do not use any products that have nicotine or tobacco. This includes cigarettes and e-cigarettes. If you need help quitting, ask your doctor. Avoid being around tobacco smoke  in general.  Limit how much alcohol you drink to no more than 1 drink a day for nonpregnant women and 2 drinks a day for men. One drink equals 12 oz of beer, 5 oz of wine, or 1 oz of hard liquor.  Do not use drugs.  Avoid taking birth control pills. Talk to your doctor about the risks of taking birth control pills if: ? You are over 50 years old. ? You smoke. ? You get migraines. ? You have had a blood clot. What other changes can be made?  Manage your cholesterol. ? It is important to eat a healthy diet. ? If your cholesterol cannot be managed through your diet, you may also need to take medicines. Take medicines as told by your doctor.  Manage your diabetes. ? It is important to eat a healthy diet and to exercise regularly. ? If your blood sugar cannot be managed through diet and exercise, you may need to take medicines. Take medicines as told by your doctor.  Control your high blood pressure (hypertension). ? Try to keep your blood pressure below 130/80. This can help lower your risk of stroke. ? It is important to eat a healthy diet and to exercise regularly. ? If your blood pressure cannot be managed through diet and exercise, you may need to take medicines. Take medicines as told by your doctor. ? Ask your doctor if you should check your blood pressure at home. ? Have your blood pressure checked every year. Do this even if your blood pressure is normal.  Talk to your doctor about getting checked for a sleep disorder. Signs of this can include: ? Snoring a lot. ? Feeling very tired.  Take over-the-counter and prescription medicines only as told by your doctor. These may include aspirin or blood thinners (antiplatelets or anticoagulants).  Make sure that any other medical conditions you have are managed. Where to find more information  American Stroke Association: www.strokeassociation.org  National Stroke Association: www.stroke.org Get help right away if:  You have  any symptoms of stroke. "BE FAST" is an easy way to remember the main warning signs: ? B - Balance. Signs are dizziness, sudden trouble walking, or loss of balance. ? E - Eyes. Signs are trouble seeing or a sudden change in how you see. ? F - Face. Signs are sudden weakness or loss of feeling of the face, or the  face or eyelid drooping on one side. ? A - Arms. Signs are weakness or loss of feeling in an arm. This happens suddenly and usually on one side of the body. ? S - Speech. Signs are sudden trouble speaking, slurred speech, or trouble understanding what people say. ? T - Time. Time to call emergency services. Write down what time symptoms started.  You have other signs of stroke, such as: ? A sudden, very bad headache with no known cause. ? Feeling sick to your stomach (nausea). ? Throwing up (vomiting). ? Jerky movements you cannot control (seizure). These symptoms may represent a serious problem that is an emergency. Do not wait to see if the symptoms will go away. Get medical help right away. Call your local emergency services (911 in the [place] Do not drive yourself to the hospital. Summary  You can prevent a stroke by eating healthy, exercising, not smoking, drinking less alcohol, and treating other health problems, such as diabetes, high blood pressure, or high cholesterol.  Do not use any products that contain nicotine or tobacco, such as cigarettes and e-cigarettes.  Get help right away if you have any signs or symptoms of a stroke. This information is not intended to replace advice given to you by your health care provider. Make sure you discuss any questions you have with your health care provider. Document Released: 12/05/2011 Document Revised: 08/01/2018 Document Reviewed: 09/06/2016 Elsevier Patient Education  2020 [employer].

## 2019-05-08 ENCOUNTER — Other Ambulatory Visit: Payer: Self-pay | Admitting: Neurosurgery

## 2019-05-08 DIAGNOSIS — I609 Nontraumatic subarachnoid hemorrhage, unspecified: Secondary | ICD-10-CM

## 2019-05-09 ENCOUNTER — Other Ambulatory Visit: Payer: Self-pay | Admitting: Neurosurgery

## 2019-05-14 NOTE — Progress Notes (Signed)
I reviewed note and agree with plan.   Penni Bombard, MD [id number], [id number] PM Certified in Neurology, Neurophysiology and Neuroimaging  [facility] [address] [place], [place] [address] [phone]

## 2019-05-27 ENCOUNTER — Other Ambulatory Visit (HOSPITAL_COMMUNITY)
Admission: RE | Admit: 2019-05-27 | Discharge: 2019-05-27 | Disposition: A | Discharge status: Home / Self Care | Payer: Medicaid Other | Source: Ambulatory Visit | Attending: Neurosurgery | Admitting: Neurosurgery

## 2019-05-27 DIAGNOSIS — Z01812 Encounter for preprocedural laboratory examination: Secondary | ICD-10-CM | POA: Insufficient documentation

## 2019-05-27 DIAGNOSIS — Z20828 Contact with and (suspected) exposure to other viral communicable diseases: Secondary | ICD-10-CM | POA: Diagnosis not present

## 2019-05-28 LAB — NOVEL CORONAVIRUS, NAA (HOSP ORDER, SEND-OUT TO REF LAB; TAT 18-24 HRS): SARS-CoV-2, NAA: NOT DETECTED

## 2019-05-29 NOTE — H&P (Signed)
Chief Complaint   SAH, s/p slipping ACA aneurysm  HPI   HPI: [name] [name] is a 33 y.o. 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 Her postoperative course was complicated by a left ACA territory stroke.  She has actually made an excellent recovery and is essentially neurologically intact at this point with the exception of mild cognitive issues.  She presents today for diagnostic cerebral angiogram for routine monitoring.  She is without any concerns.  Patient Active Problem List   Diagnosis Date Noted  . Late effect of cerebrovascular accident (CVA) 12/09/2018  . 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 Non-fluent aphasia   . Complex tear of lateral meniscus of right knee as current injury   . Primary osteoarthritis of right knee   . Knee pain, right   . 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 Leukopenia   . Acute deep vein thrombosis (DVT) of right peroneal vein ([place])   . Essential hypertension   . Hyperglycemia   . SAH (subarachnoid hemorrhage) ([place])   . Acute cerebral infarction ([place])   . HCAP (healthcare-associated pneumonia)   . Benign essential HTN   . Acute blood loss anemia   . Right hemiplegia ([place])   . 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 Endotracheal tube present   . Brain aneurysm   . Subarachnoid hemorrhage ([place]) 05/09/2018  . Gastroesophageal reflux disease without esophagitis 10/09/2016  . BMI 31.0-31.9,adult 07/20/2016  . Obesity in pregnancy, antepartum 07/20/2016  . Tobacco abuse 06/01/2015  . Marijuana use 06/23/2014    PMH: Past Medical History:  Diagnosis Date  . Asthma, allergic    uses inhaler prn - infrequently  . Bilateral thoracic back pain 09/13/2015  . Eczema   . Hypertension   . Lactose intolerance   . Mature cystic teratoma of ovary   . Mood swings 06/01/2015  . Seasonal allergies    . Seizures ([place])   . Stroke [facility]) 04/2018   during aneurysm surgery    PSH: Past Surgical History:  Procedure Laterality Date  . CRANIOTOMY N/A 05/10/2018   Procedure: 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;  Surgeon: Consuella Lose, MD;  Location: [facility];  Service: Neurosurgery;  Laterality: N/A;  . IR 3D INDEPENDENT WKST  05/10/2018  . IR ANGIO INTRA EXTRACRAN SEL INTERNAL CAROTID BILAT MOD SED  05/10/2018  . IR ANGIO VERTEBRAL SEL VERTEBRAL BILAT MOD SED  05/10/2018  . RADIOLOGY WITH ANESTHESIA N/A 05/10/2018   Procedure: EMBOLIZATION OF ANEURSYM;  Surgeon: Consuella Lose, MD;  Location: [facility];  Service: Radiology;  Laterality: N/A;  . TUBAL LIGATION N/A 12/15/2016   Procedure: POST PARTUM TUBAL LIGATION;  Surgeon: Mora Bellman, MD;  Location: [facility];  Service: Gynecology;  Laterality: N/A;  . WISDOM TOOTH EXTRACTION      (Not in a hospital admission)   SH: Social History   Tobacco Use  . Smoking status: Current Every Day Smoker    Packs/day: 0.25    Types: Cigarettes  . Smokeless tobacco: Former Network engineer Use Topics  . Alcohol use: No  . Drug use: Yes    Types: Marijuana    Comment: used to yrs ago    MEDS: Prior to Admission medications   Medication Sig Start Date End Date Taking? Authorizing Provider  acetaminophen (TYLENOL) 325 MG tablet Take 1-2 tablets (325-650 mg total) by mouth every  4 (four) hours as needed for mild pain. 06/24/18   Love, Ivan Anchors, PA-C  amLODipine (NORVASC) 10 MG tablet Take 1 tablet (10 mg total) by mouth daily. 11/19/18   Charlott Rakes, MD  baclofen (LIORESAL) 10 MG tablet Take 1 tablet (10 mg total) by mouth 3 (three) times daily. 12/16/18   Charlott Rakes, MD  cetirizine (ZYRTEC) 10 MG tablet TAKE 1 TABLET BY MOUTH ONCE DAILY Patient taking differently: Take 10 mg by mouth daily.  03/07/18   Charlott Rakes, MD  fluticasone (FLONASE) 50 MCG/ACT nasal spray Place 1 spray into both  nostrils daily. 06/25/18   Love, Ivan Anchors, PA-C  levETIRAcetam (KEPPRA) 1000 MG tablet Take 1 tablet (1,000 mg total) by mouth 2 (two) times daily. 04/07/19   Penumalli, Earlean Polka, MD  montelukast (SINGULAIR) 10 MG tablet TAKE 1 TABLET BY MOUTH ONCE DAILY AT BEDTIME Patient not taking: No sig reported 03/07/18   Charlott Rakes, MD  Multiple Vitamin (MULTIVITAMIN WITH MINERALS) TABS tablet Take 1 tablet by mouth daily. Patient not taking: Reported on 05/07/2019 06/25/18   Love, Ivan Anchors, PA-C  sodium chloride (OCEAN) 0.65 % SOLN nasal spray Place 1 spray into both nostrils 2 (two) times daily before lunch and supper. 06/24/18   Bary Leriche, PA-C    ALLERGY: Allergies  Allergen Reactions  . Sulfa Antibiotics Swelling    Causes swelling on the face.  . Shellfish Allergy Other (See Comments)    Stomach upset    Social History   Tobacco Use  . Smoking status: Current Every Day Smoker    Packs/day: 0.25    Types: Cigarettes  . Smokeless tobacco: Former Network engineer Use Topics  . Alcohol use: No     Family History  Adopted: Yes  Problem Relation Age of Onset  . Hypertension Father   . Hypertension Maternal Aunt   . Hypertension Maternal Uncle   . Hypertension Maternal Grandmother   . Eczema Maternal Grandmother   . Hypertension Maternal Grandfather   . Hypertension Paternal Grandmother   . Hypertension Mother   . Eczema Mother      ROS   ROS  Exam   There were no vitals filed for this visit. General appearance: WDWN, NAD Eyes: No scleral injection Cardiovascular: Regular rate and rhythm without murmurs, rubs, gallops. No edema or variciosities. Distal pulses normal. Pulmonary: Effort normal, non-labored breathing Musculoskeletal:     Muscle tone upper extremities: Normal    Muscle tone lower extremities: Normal    Motor exam: Upper Extremities Deltoid Bicep Tricep Grip  Right 5/5 5/5 5/5 5/5  Left 5/5 5/5 5/5 5/5   Lower Extremity IP Quad PF DF EHL  Right 5/5  5/5 5/5 5/5 5/5  Left 5/5 5/5 5/5 5/5 5/5   Neurological Mental Status:    - Patient is awake, alert, oriented to person, place, month, year, and situation    - Patient is able to give a clear and coherent history.    - No signs of aphasia or neglect Cranial Nerves    - II: Visual Fields are full. PERRL    - III/IV/VI: EOMI without ptosis or diploplia.     - V: Facial sensation is grossly normal    - VII: Facial movement is symmetric.     - VIII: hearing is intact to voice    - X: Uvula elevates symmetrically    - XI: Shoulder shrug is symmetric.    - XII: tongue is midline without atrophy  or fasciculations.  Sensory: Sensation grossly intact to LT  Results - Imaging/Labs   Results for orders placed or performed during the hospital encounter of 05/27/19 (from the past 48 hour(s))  Novel Coronavirus, NAA (Hosp order, Send-out to Ref Lab; TAT 18-24 hrs     Status: None   Collection Time: 05/27/19 12:28 PM   Specimen: Nasopharyngeal Swab; Respiratory  Result Value Ref Range   SARS-CoV-2, NAA NOT DETECTED NOT DETECTED    Comment: (NOTE) This nucleic acid amplification test was developed and its performance characteristics determined by [employer]. Nucleic acid amplification tests include PCR and TMA. This test has not been FDA cleared or approved. This test has been authorized by FDA under an Emergency Use Authorization (EUA). This test is only authorized for the duration of time the declaration that circumstances exist justifying the authorization of the emergency use of in vitro diagnostic tests for detection of SARS-CoV-2 virus and/or diagnosis of COVID-19 infection under section 564(b)(1) of the Act, 21 U.S.C. [id number]) (1), unless the authorization is terminated or revoked sooner. When diagnostic testing is negative, the possibility of a false negative result should be considered in the context of a patient's recent exposures and the presence of clinical signs  and symptoms consistent with COVID-19. An individual without symptoms of COVID- 19 and who is not shedding SARS-CoV-2 vi rus would expect to have a negative (not detected) result in this assay. Performed At: [facility] [place], [place] [id number] Rush Farmer MD [id number]    Coronavirus Source NASOPHARYNGEAL     Comment: Performed at [facility], [place] [address], [place], [place] [address]    No results found.  IMAGING: CT angiogram dated 11/09/2018 was reviewed.  This does not demonstrate any evidence of aneurysm recurrence.  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   Impression/Plan   33 y.o. female  one year status post subarachnoid hemorrhage and clipping of an anterior communicating artery aneurysm with subsequent left ACA territory stroke.  We will proceed with formal catheter angiogram for routine monitoring.  Risks benefits and alternatives were discussed.  Patient stated understanding and wished to proceed.  Ferne Reus, PA-C [facility] [employer]

## 2019-05-30 ENCOUNTER — Other Ambulatory Visit: Payer: Self-pay | Admitting: Neurosurgery

## 2019-05-30 ENCOUNTER — Encounter (HOSPITAL_COMMUNITY): Payer: Self-pay

## 2019-05-30 ENCOUNTER — Other Ambulatory Visit: Payer: Self-pay

## 2019-05-30 ENCOUNTER — Ambulatory Visit (HOSPITAL_COMMUNITY)
Admission: RE | Admit: 2019-05-30 | Discharge: 2019-05-30 | Disposition: A | Discharge status: Home / Self Care | Payer: Medicaid Other | Source: Ambulatory Visit | Attending: Neurosurgery | Admitting: Neurosurgery

## 2019-05-30 DIAGNOSIS — I69351 Hemiplegia and hemiparesis following cerebral infarction affecting right dominant side: Secondary | ICD-10-CM | POA: Diagnosis not present

## 2019-05-30 DIAGNOSIS — I10 Essential (primary) hypertension: Secondary | ICD-10-CM | POA: Insufficient documentation

## 2019-05-30 DIAGNOSIS — I609 Nontraumatic subarachnoid hemorrhage, unspecified: Secondary | ICD-10-CM | POA: Insufficient documentation

## 2019-05-30 DIAGNOSIS — Z79899 Other long term (current) drug therapy: Secondary | ICD-10-CM | POA: Diagnosis not present

## 2019-05-30 DIAGNOSIS — K219 Gastro-esophageal reflux disease without esophagitis: Secondary | ICD-10-CM | POA: Diagnosis not present

## 2019-05-30 DIAGNOSIS — M1711 Unilateral primary osteoarthritis, right knee: Secondary | ICD-10-CM | POA: Diagnosis not present

## 2019-05-30 DIAGNOSIS — F17210 Nicotine dependence, cigarettes, uncomplicated: Secondary | ICD-10-CM | POA: Diagnosis not present

## 2019-05-30 DIAGNOSIS — Z882 Allergy status to sulfonamides status: Secondary | ICD-10-CM | POA: Diagnosis not present

## 2019-05-30 HISTORY — PX: IR US GUIDE VASC ACCESS RIGHT: IMG2390

## 2019-05-30 HISTORY — PX: IR ANGIO INTRA EXTRACRAN SEL INTERNAL CAROTID BILAT MOD SED: IMG5363

## 2019-05-30 HISTORY — PX: IR ANGIO VERTEBRAL SEL VERTEBRAL UNI R MOD SED: IMG5368

## 2019-05-30 LAB — CBC WITH DIFFERENTIAL/PLATELET
Abs Immature Granulocytes: 0.02 10*3/uL (ref 0.00–0.07)
Basophils Absolute: 0 10*3/uL (ref 0.0–0.1)
Basophils Relative: 0 %
Eosinophils Absolute: 0.1 10*3/uL (ref 0.0–0.5)
Eosinophils Relative: 2 %
HCT: 42.1 % (ref 36.0–46.0)
Hemoglobin: 14.4 g/dL (ref 12.0–15.0)
Immature Granulocytes: 1 %
Lymphocytes Relative: 59 %
Lymphs Abs: 2.2 10*3/uL (ref 0.7–4.0)
MCH: 33 pg (ref 26.0–34.0)
MCHC: 34.2 g/dL (ref 30.0–36.0)
MCV: 96.3 fL (ref 80.0–100.0)
Monocytes Absolute: 0.2 10*3/uL (ref 0.1–1.0)
Monocytes Relative: 6 %
Neutro Abs: 1.2 10*3/uL — ABNORMAL LOW (ref 1.7–7.7)
Neutrophils Relative %: 32 %
Platelets: 170 10*3/uL (ref 150–400)
RBC: 4.37 MIL/uL (ref 3.87–5.11)
RDW: 12.9 % (ref 11.5–15.5)
WBC: 3.8 10*3/uL — ABNORMAL LOW (ref 4.0–10.5)
nRBC: 0 % (ref 0.0–0.2)

## 2019-05-30 LAB — BASIC METABOLIC PANEL
Anion gap: 10 (ref 5–15)
BUN: <5 mg/dL — ABNORMAL LOW (ref 6–20)
CO2: 24 mmol/L (ref 22–32)
Calcium: 9.4 mg/dL (ref 8.9–10.3)
Chloride: 108 mmol/L (ref 98–111)
Creatinine, Ser: 0.97 mg/dL (ref 0.44–1.00)
GFR calc Af Amer: >60 mL/min (ref >60)
GFR calc non Af Amer: >60 mL/min (ref >60)
Glucose, Bld: 90 mg/dL (ref 70–99)
Potassium: 3.4 mmol/L — ABNORMAL LOW (ref 3.5–5.1)
Sodium: 142 mmol/L (ref 135–145)

## 2019-05-30 LAB — PROTIME-INR
INR: 0.9 (ref 0.8–1.2)
Prothrombin Time: 12.3 seconds (ref 11.4–15.2)

## 2019-05-30 LAB — APTT: aPTT: 29 seconds (ref 24–36)

## 2019-05-30 MED ORDER — IOHEXOL 300 MG/ML  SOLN
100.0000 mL | Freq: Once | INTRAMUSCULAR | Status: AC | PRN
  Administered 2019-05-30: 24 mL via INTRA_ARTERIAL

## 2019-05-30 MED ORDER — ATROPINE SULFATE 1 MG/10ML IJ SOSY
PREFILLED_SYRINGE | INTRAMUSCULAR | Status: AC
  Filled 2019-05-30: qty 10

## 2019-05-30 MED ORDER — VERAPAMIL HCL 2.5 MG/ML IV SOLN
INTRAVENOUS | Status: AC
  Administered 2019-05-30: 2.5 mg
  Filled 2019-05-30: qty 2

## 2019-05-30 MED ORDER — SODIUM CHLORIDE 0.9 % IV SOLN: INTRAVENOUS | Status: DC

## 2019-05-30 MED ORDER — LIDOCAINE HCL 1 % IJ SOLN
INTRAMUSCULAR | Status: AC
  Filled 2019-05-30: qty 20

## 2019-05-30 MED ORDER — FENTANYL CITRATE (PF) 100 MCG/2ML IJ SOLN
INTRAMUSCULAR | Status: DC | PRN
  Administered 2019-05-30: 25 ug via INTRAVENOUS

## 2019-05-30 MED ORDER — HYDROCODONE-ACETAMINOPHEN 5-325 MG PO TABS: 1.0000 | ORAL_TABLET | ORAL | Status: DC | PRN

## 2019-05-30 MED ORDER — HEPARIN SODIUM (PORCINE) 1000 UNIT/ML IJ SOLN
INTRAMUSCULAR | Status: DC | PRN
  Administered 2019-05-30: 3000 [IU] via INTRAVENOUS

## 2019-05-30 MED ORDER — LIDOCAINE HCL (PF) 1 % IJ SOLN
INTRAMUSCULAR | Status: DC | PRN
  Administered 2019-05-30: 1 mL

## 2019-05-30 MED ORDER — MIDAZOLAM HCL 2 MG/2ML IJ SOLN
INTRAMUSCULAR | Status: AC
  Filled 2019-05-30: qty 2

## 2019-05-30 MED ORDER — FENTANYL CITRATE (PF) 100 MCG/2ML IJ SOLN
INTRAMUSCULAR | Status: AC
  Filled 2019-05-30: qty 2

## 2019-05-30 MED ORDER — HEPARIN SODIUM (PORCINE) 1000 UNIT/ML IJ SOLN
INTRAMUSCULAR | Status: AC
  Administered 2019-05-30: 3000 [IU]
  Filled 2019-05-30: qty 1

## 2019-05-30 MED ORDER — MIDAZOLAM HCL 2 MG/2ML IJ SOLN
INTRAMUSCULAR | Status: DC | PRN
  Administered 2019-05-30: 1 mg via INTRAVENOUS

## 2019-05-30 MED ORDER — NITROGLYCERIN 1 MG/10 ML FOR IR/CATH LAB
INTRA_ARTERIAL | Status: AC
  Administered 2019-05-30: 500 ug
  Filled 2019-05-30: qty 10

## 2019-05-30 NOTE — Discharge Instructions (Addendum)
DRINK PLENTY OF FLUIDS FOR THE NEXT 2-3 DAYS.  KEEP ARM ELEVATED THE REMAINDER OF THE DAY.  Radial Site Care  This sheet gives you information about how to care for yourself after your procedure. Your health care provider may also give you more specific instructions. If you have problems or questions, contact your health care provider. What can I expect after the procedure? After the procedure, it is common to have:  Bruising and tenderness at the catheter insertion area. Follow these instructions at home: Medicines  Take over-the-counter and prescription medicines only as told by your health care provider. Insertion site care  Follow instructions from your health care provider about how to take care of your insertion site. Make sure you: ? Wash your hands with soap and water before you change your bandage (dressing). If soap and water are not available, use hand sanitizer. ? Change your dressing as told by your health care provider.  Check your insertion site every day for signs of infection. Check for: ? Redness, swelling, or pain. ? Fluid or blood. ? Pus or a bad smell. ? Warmth.  Do not take baths, swim, or use a hot tub until for 5 days.  You may shower 24-48 hours after the procedure. ? Remove the dressing and gently wash the site with plain soap and water. ? Pat the area dry with a clean towel. ? Do not rub the site. That could cause bleeding.  Do not apply powder or lotion to the site. Activity   For 24 hours after the procedure, or as directed by your health care provider: ? Do not flex or bend the affected arm. ? Do not push or pull heavy objects with the affected arm. ? Do not drive yourself home from the hospital or clinic. You may drive 24 hours after the procedure unless your health care provider tells you not to. ? Do not operate machinery or power tools.  Do not lift anything that is heavier than 10 lb for 5 days.  Ask your health care provider when it  is okay to: ? Return to work or school. ? Resume usual physical activities or sports. ? Resume sexual activity. General instructions  If the catheter site starts to bleed, raise your arm and put firm pressure on the site. If the bleeding does not stop, get help right away. This is a medical emergency.  If you went home on the same day as your procedure, a responsible adult should be with you for the first 24 hours after you arrive home.  Keep all follow-up visits as told by your health care provider. This is important. Contact a health care provider if:  You have a fever.  You have redness, swelling, or yellow drainage around your insertion site. Get help right away if:  You have unusual pain at the radial site.  The catheter insertion area swells very fast.  The insertion area is bleeding, and the bleeding does not stop when you hold steady pressure on the area.  Your arm or hand becomes pale, cool, tingly, or numb. These symptoms may represent a serious problem that is an emergency. Do not wait to see if the symptoms will go away. Get medical help right away. Call your local emergency services (911 in the [place] Do not drive yourself to the hospital. Summary  After the procedure, it is common to have bruising and tenderness at the site.  Follow instructions from your health care provider about how to take  care of your radial site wound. Check the wound every day for signs of infection.  Do not lift anything that is heavier than 10 lb for 5 days.  This information is not intended to replace advice given to you by your health care provider. Make sure you discuss any questions you have with your health care provider. Document Released: 07/08/2010 Document Revised: 07/11/2017 Document Reviewed: 07/11/2017 Elsevier Patient Education  2020 [employer]. Cerebral Angiogram  A cerebral angiogram is a procedure that is used to examine the blood vessels in the brain and neck. In this  procedure, contrast dye is injected through a long, thin tube (catheter) into an artery. X-rays are then taken, which show if there is a blockage or problem in a blood vessel. Tell a health care provider about:  Any allergies you have, including allergies to shellfish, contrast dye, or iodine  All medicines you are taking, including vitamins, herbs, eye drops, creams, and over-the-counter medicines.  Any problems you or family members have had with anesthetic medicines.  Any blood disorders you have.  Any surgeries you have had.  Any medical conditions you have.  Whether you are pregnant or may be pregnant.  Whether you are currently breastfeeding. What are the risks? Generally, this is a safe procedure. However, problems may occur, including:  Damage to surrounding nerves, tissues, or structures.  Blood clot.  Inability to remember what happened (amnesia). This is usually temporary.  Weakness, numbness, speech, or vision problems. This is usually temporary.  Stroke.  Kidney injury.  Bleeding or bruising.  Allergic reaction medicines or dyes.  Infection. What happens before the procedure? Staying hydrated Follow instructions from your health care provider about hydration, which may include:  Up to 2 hours before the procedure - you may continue to drink clear liquids, such as water, clear fruit juice, black coffee, and plain tea. Eating and drinking restrictions Follow instructions from your health care provider about eating and drinking, which may include:  8 hours before the procedure - stop eating heavy meals or foods such as meat, fried foods, or fatty foods.  6 hours before the procedure - stop eating light meals or foods, such as toast or cereal.  6 hours before the procedure - stop drinking milk or drinks that contain milk.  2 hours before the procedure - stop drinking clear liquids. General instructions  Ask your health care provider about: ? Changing  or stopping your regular medicines. This is especially important if you are taking diabetes medicines or blood thinners. ? Taking medicines such as aspirin or ibuprofen. These medicines can thin your blood. Do not take these medicines before your procedure if your health care provider asks you not to.  You may have blood tests done.  Plan to have someone take you home from the hospital or clinic.  If you will be going home right after the procedure, plan to have someone with you for 24 hours. What happens during the procedure?  To reduce your risk of infection: ? Your health care team will wash or sanitize their hands. ? Your skin will be washed with soap. ? Hair may be removed from the surgical area.  You will lie on your back on an imaging bed with an X-ray machine around you.  Your head will be secured to the bed with a strap or device to help you keep still.  An IV tube will be inserted into one of your veins.  You will be given one or  more of the following: ? A medicine to help you relax (sedative). ? A medicine to numb the area (local anesthetic) where the catheter will be inserted. This is usually your groin, leg, or arm.  Your heart rate and other vital signs will be watched carefully. You may have electrodes placed on your chest.  A small cut (incision) will be made where the catheter will be inserted.  The catheter will be inserted into an artery that leads to the head. You may feel slight pressure.  The catheter will be moved through the body up to your neck and brain. X-ray images will help your health care provider bring the catheter to the correct location.  The dye will be injected into the catheter and will travel to your head or neck area. You may feel a warming or burning sensation or notice a strange taste in your mouth as the dye goes through your system.  Images will be taken to show how the dye flows through the area.  While the images are being taken, you  may be given instructions on breathing, swallowing, moving, or talking.  When the images are finished, the catheter will be slowly removed.  Pressure will be applied to the skin to stop any bleeding. A tight bandage (dressing) or seal will be applied to the skin.  Your IV will be removed. The procedure may vary among health care providers and hospitals. What happens after the procedure?  Your blood pressure, heart rate, breathing rate, and blood oxygen level will be monitored until the medicines you were given have worn off.  You will be asked to lie flat for several hours. The arm or leg where the catheter was inserted will need to be kept straight while you are in the recovery room.  The insertion site will be watched for bleeding and you will be checked often.  You will be instructed to drink plenty of fluids. This will help wash the contrast dye out of your system.  Do not drive for 24 hours if you received a sedative.  It is up to you to get the results of your procedure. Ask your health care provider, or the department that is doing the procedure, when your results will be ready. Summary  A cerebral angiogram is a procedure that is used to examine the blood vessels in the brain and neck.  In this procedure, contrast dye is injected through a long, thin tube (catheter) into an artery. X-rays are then taken, which show if there is a blockage or problem in a blood vessel.  You will be given a sedative to help you relax during the procedure. A local anesthetic will be used to numb the area where the catheter is inserted. You may feel pressure when the catheter is inserted, and you may feel a warm sensation when the dye is injected.  After the procedure, you will be asked to lie flat for several hours. The arm or leg where the catheter was inserted will need to be kept straight while you are in the recovery room. This information is not intended to replace advice given to you by your  health care provider. Make sure you discuss any questions you have with your health care provider. Document Released: 10/20/2013 Document Revised: 05/18/2017 Document Reviewed: 07/10/2016 Elsevier Patient Education  [place]. Moderate Conscious Sedation, Adult, Care After These instructions provide you with information about caring for yourself after your procedure. Your health care provider may also give you  more specific instructions. Your treatment has been planned according to current medical practices, but problems sometimes occur. Call your health care provider if you have any problems or questions after your procedure. What can I expect after the procedure? After your procedure, it is common:  To feel sleepy for several hours.  To feel clumsy and have poor balance for several hours.  To have poor judgment for several hours.  To vomit if you eat too soon. Follow these instructions at home: For at least 24 hours after the procedure:   Do not: ? Participate in activities where you could fall or become injured. ? Drive. ? Use heavy machinery. ? Drink alcohol. ? Take sleeping pills or medicines that cause drowsiness. ? Make important decisions or sign legal documents. ? Take care of children on your own.  Rest. Eating and drinking  Follow the diet recommended by your health care provider.  If you vomit: ? Drink water, juice, or soup when you can drink without vomiting. ? Make sure you have little or no nausea before eating solid foods. General instructions  Have a responsible adult stay with you until you are awake and alert.  Take over-the-counter and prescription medicines only as told by your health care provider.  If you smoke, do not smoke without supervision.  Keep all follow-up visits as told by your health care provider. This is important. Contact a health care provider if:  You keep feeling nauseous or you keep vomiting.  You feel  light-headed.  You develop a rash.  You have a fever. Get help right away if:  You have trouble breathing. This information is not intended to replace advice given to you by your health care provider. Make sure you discuss any questions you have with your health care provider. Document Released: 03/26/2013 Document Revised: 05/18/2017 Document Reviewed: 09/25/2015 Elsevier Patient Education  2020 [employer].

## 2019-05-30 NOTE — Brief Op Note (Signed)
  NEUROSURGERY BRIEF OPERATIVE  NOTE   PREOP DX: Hx of Acom aneurysm/SAH  POSTOP DX: Same  PROCEDURE: Diagnostic cerebral angiogram  SURGEON: Dr. Consuella Lose, MD  ANESTHESIA: IV Sedation with Local  EBL: Minimal  SPECIMENS: None  COMPLICATIONS: None  CONDITION: Stable to recovery  FINDINGS (Full report in [facility]): 1. No residual or recurrent aneurysm at the Acom. The distal left ACA is diminutive but patent. 2. No other aneurysms, AVMs, or fistulas seen.

## 2019-06-10 ENCOUNTER — Encounter: Payer: Medicaid Other | Attending: Physical Medicine & Rehabilitation | Admitting: Physical Medicine & Rehabilitation

## 2019-06-10 ENCOUNTER — Encounter: Payer: Self-pay | Admitting: Physical Medicine & Rehabilitation

## 2019-06-10 ENCOUNTER — Other Ambulatory Visit: Payer: Self-pay

## 2019-06-10 VITALS — BP 123/83 | HR 79 | Temp 97.7°F | Ht 67.0 in | Wt 169.0 lb

## 2019-06-10 DIAGNOSIS — S83281S Other tear of lateral meniscus, current injury, right knee, sequela: Secondary | ICD-10-CM | POA: Diagnosis not present

## 2019-06-10 DIAGNOSIS — R269 Unspecified abnormalities of gait and mobility: Secondary | ICD-10-CM | POA: Insufficient documentation

## 2019-06-10 DIAGNOSIS — I6930 Unspecified sequelae of cerebral infarction: Secondary | ICD-10-CM

## 2019-06-10 DIAGNOSIS — R569 Unspecified convulsions: Secondary | ICD-10-CM | POA: Diagnosis not present

## 2019-06-10 NOTE — Progress Notes (Signed)
Subjective:    Patient ID: [name] [name], female    DOB: [birth year], 33 y.o.   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  Last clinic visit 12/09/2018.  Mother supplements history. Since that time, patient went to the ED for seizures.  She also had a cerebral angio with neurosurgery which did not show any new/recurrent aneurysm, notes reviewed. No reported seizures since that time. She continues HEP. Knee pain has improved. BP is controlled. She had 2-3 falls, tripping over toys, and during seizure.   Pain Inventory Average Pain 2 Pain Right Now 0 My pain is intermittent and aching  In the last 24 hours, has pain interfered with the following? General activity 0 Relation with others 0 Enjoyment of life 0 What TIME of day is your pain at its worst? na Sleep (in general) Good  Pain is worse with: inactivity Pain improves with: rest, heat/ice, therapy/exercise and pacing activities Relief from Meds: no pain medication  Mobility walk with assistance use a cane how many minutes can you walk? 10 ability to climb steps?  yes do you drive?  no  Function disabled: date disabled 11/2017  Neuro/Psych weakness trouble walking  Prior Studies Any changes since last visit?  no  Physicians involved in your care Any changes since last visit?  no   Family History  Adopted: Yes  Problem Relation Age of Onset  . Hypertension Father   . Hypertension Maternal Aunt   . Hypertension Maternal Uncle   . Hypertension Maternal Grandmother   . Eczema Maternal Grandmother   . Hypertension Maternal Grandfather   . Hypertension Paternal Grandmother   . Hypertension Mother   . Eczema Mother    Social History   Socioeconomic History  . Marital status: Single    Spouse name: Not on file  . Number of children: 2  . Years of education: Not on file  .  Highest education level: Some college, no degree  Occupational History  . Not on file  Tobacco Use  . Smoking status: Current Every Day Smoker    Packs/day: 0.25    Types: Cigarettes  . Smokeless tobacco: Former Network engineer and Sexual Activity  . Alcohol use: No  . Drug use: Not Currently    Types: Marijuana    Comment: used to yrs ago  . Sexual activity: Not Currently    Birth control/protection: None  Other Topics Concern  . Not on file  Social History Narrative   Lives with 2 children   Coffee 2 cups daily   Social Determinants of Health   Financial Resource Strain:   . Difficulty of Paying Living Expenses: Not on file  Food Insecurity:   . Worried About Charity fundraiser in the Last Year: Not on file  . Ran Out of Food in the Last Year: Not on file  Transportation Needs:   . Lack of Transportation (Medical): Not on file  . Lack of Transportation (Non-Medical): Not on file  Physical Activity:   . Days of Exercise per Week: Not on file  . Minutes of Exercise per Session: Not on file  Stress:   . Feeling of Stress : Not on file  Social Connections:   . Frequency of Communication with Friends and Family: Not on file  . Frequency of Social Gatherings with Friends and Family: Not on file  . Attends Religious  Services: Not on file  . Active Member of Clubs or Organizations: Not on file  . Attends Archivist Meetings: Not on file  . Marital Status: Not on file   Past Surgical History:  Procedure Laterality Date  . 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 IR 3D INDEPENDENT WKST  05/10/2018  . IR ANGIO INTRA EXTRACRAN SEL INTERNAL CAROTID BILAT MOD SED  05/10/2018  . IR ANGIO INTRA EXTRACRAN SEL INTERNAL CAROTID BILAT MOD SED  05/30/2019  . IR ANGIO VERTEBRAL SEL VERTEBRAL BILAT MOD SED   05/10/2018  . IR ANGIO VERTEBRAL SEL VERTEBRAL UNI R MOD SED  05/30/2019  . IR US GUIDE VASC ACCESS RIGHT  05/30/2019  . RADIOLOGY WITH ANESTHESIA N/A 05/10/2018   Procedure: EMBOLIZATION OF ANEURSYM;  Surgeon: Consuella Lose, MD;  Location: [facility];  Service: Radiology;  Laterality: N/A;  . TUBAL LIGATION N/A 12/15/2016   Procedure: POST PARTUM TUBAL LIGATION;  Surgeon: Mora Bellman, MD;  Location: [facility];  Service: Gynecology;  Laterality: N/A;  . WISDOM TOOTH EXTRACTION     Past Medical History:  Diagnosis Date  . Asthma, allergic    uses inhaler prn - infrequently  . Bilateral thoracic back pain 09/13/2015  . Eczema   . Hypertension   . Lactose intolerance   . Mature cystic teratoma of ovary   . Mood swings 06/01/2015  . Seasonal allergies   . Seizures ([place])   . Stroke ([place]) 04/2018   during aneurysm surgery   BP 123/83   Pulse 79   Temp 97.7 F (36.5 C)   Ht 5\' 7"  (1.702 m)   Wt 169 lb (76.7 kg)   LMP 05/12/2019   SpO2 95%   BMI 26.47 kg/m   Opioid Risk Score:   Fall Risk Score:  `1  Depression screen PHQ 2/9  Depression screen [facility] 2/9 08/07/2018 04/12/2018 10/25/2017 07/25/2017 02/02/2017 12/11/2016 12/07/2016  Decreased Interest 0 0 0 0 1 1 1   Down, Depressed, Hopeless 0 0 0 0 0 0 0  PHQ - 2 Score 0 0 0 0 1 1 1   Altered sleeping - 1 1 1  0 2 2  Tired, decreased energy - 1 2 1  0 1 2  Change in appetite - 0 0 0 0 0 0  Feeling bad or failure about yourself  - 0 0 0 0 0 0  Trouble concentrating - 0 1 0 1 0 0  Moving slowly or fidgety/restless - 0 0 0 0 0 1  Suicidal thoughts - 0 0 0 0 0 0  PHQ-9 Score - 2 4 2 2 4 6   Some recent data might be hidden   Review of Systems  HENT: Negative.   Eyes: Negative.   Respiratory: Negative.   Cardiovascular: Negative.   Gastrointestinal: Negative.   Endocrine: Negative.   Genitourinary: Negative.   Musculoskeletal: Positive for gait problem.  Skin: Positive for rash.  Allergic/Immunologic: Positive for environmental  allergies.  Hematological: Negative.   Psychiatric/Behavioral: Positive for confusion. The patient is nervous/anxious.        Objective:   Physical Exam  Constitutional: No distress . Vital signs reviewed. HENT: Normocephalic.  Atraumatic. Eyes: EOMI. No discharge. Cardiovascular: No JVD. Respiratory: Normal effort.  No stridor. GI: Non-distended. Skin: Burn over left eye Psych: Normal mood.  Normal behavior. Musc: No edema in extremities.  No tenderness in extremities. 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  1.  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             Cont HEP  2. Pain Management, mainly right knee due to multiple injuries  MRI 05/2018: 1. Tiny Baker cyst, likely corresponding to the cystic structure seen in the popliteal fossa on recent right lower extremity ultrasound. 2. Complex tear of the lateral meniscus anterior root and horn extending to the body junction with borderline extrusion of the body. 3. Mild edema tracks adjacent to the MCL. This can be incidental but in the appropriate clinical circumstance could represent grade 1 sprain. 4. Mild medial and lateral compartment osteoarthritis.:   Controlled, occasionally using Tylenol  Baker's cyst, lateral meniscal tear, and OA.    May consider brace for comfort  Improving  3. Gait abnormality  Improving  Still with some instability at times  Mother/pt to consider surgical regerral if causing falls or interfering with ambulation/stairs/etc  4. Seizures   Cont meds  Follow up with Neurology

## 2019-06-21 ENCOUNTER — Other Ambulatory Visit: Payer: Self-pay | Admitting: Family Medicine

## 2019-07-07 ENCOUNTER — Ambulatory Visit: Payer: Self-pay | Admitting: Family Medicine

## 2019-07-22 ENCOUNTER — Other Ambulatory Visit: Payer: Self-pay | Admitting: Family Medicine

## 2019-07-22 DIAGNOSIS — I10 Essential (primary) hypertension: Secondary | ICD-10-CM

## 2019-07-30 ENCOUNTER — Other Ambulatory Visit: Payer: Self-pay

## 2019-07-30 ENCOUNTER — Ambulatory Visit: Payer: Medicaid Other | Attending: Family Medicine | Admitting: Family Medicine

## 2019-07-30 DIAGNOSIS — R569 Unspecified convulsions: Secondary | ICD-10-CM | POA: Diagnosis not present

## 2019-07-30 DIAGNOSIS — F17210 Nicotine dependence, cigarettes, uncomplicated: Secondary | ICD-10-CM

## 2019-07-30 DIAGNOSIS — Z720 Tobacco use: Secondary | ICD-10-CM

## 2019-07-30 DIAGNOSIS — J309 Allergic rhinitis, unspecified: Secondary | ICD-10-CM

## 2019-07-30 DIAGNOSIS — I69351 Hemiplegia and hemiparesis following cerebral infarction affecting right dominant side: Secondary | ICD-10-CM

## 2019-07-30 DIAGNOSIS — I10 Essential (primary) hypertension: Secondary | ICD-10-CM

## 2019-07-30 MED ORDER — AMLODIPINE BESYLATE 10 MG PO TABS: 10.0000 mg | ORAL_TABLET | Freq: Every day | ORAL | 1 refills | Status: DC

## 2019-07-30 MED ORDER — MONTELUKAST SODIUM 10 MG PO TABS: 10.0000 mg | ORAL_TABLET | Freq: Every day | ORAL | 6 refills | Status: DC

## 2019-07-30 MED ORDER — CETIRIZINE HCL 10 MG PO TABS: 10.0000 mg | ORAL_TABLET | Freq: Every day | ORAL | 6 refills | Status: DC

## 2019-07-30 MED ORDER — FLUTICASONE PROPIONATE 50 MCG/ACT NA SUSP: 1.0000 | Freq: Every day | NASAL | 6 refills | Status: DC

## 2019-07-30 NOTE — Progress Notes (Signed)
Virtual Visit via Telephone Note  I connected with [facility], on 07/30/2019 at 3:09 PM by telephone due to the COVID-19 pandemic and verified that I am speaking with the correct person using two identifiers.   Consent: I discussed the limitations, risks, security and privacy concerns of performing an evaluation and management service by telephone and the availability of in person appointments. I also discussed with the patient that there may be a patient responsible charge related to this service. The patient expressed understanding and agreed to proceed.   Location of Patient: Home  Location of Provider: Clinic   Persons participating in Telemedicine visit: Karoline Caldwell Farrington-CMA Dr. 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   Her last seizure was in 04/04/2019 and she has been compliant with Keppra Seen by Rehab medicine in 05/2019 ; she feels unblalanced sometimes but does not need a cane.  PT sessions have been completed. She is unable to check her BP at home; in 05/2019 it was 123/83 at her rehab visit. She has been out of her allergy medications and is needing refills today. She smokes 5-6 cig/day. Has no additional concerns today.  Past Medical History:  Diagnosis Date  . Asthma, allergic    uses inhaler prn - infrequently  . Bilateral thoracic back pain 09/13/2015  . Eczema   . Hypertension   . Lactose intolerance   . Mature cystic teratoma of ovary   . Mood swings 06/01/2015  . Seasonal allergies   . Seizures ([place])   . Stroke ([place]) 04/2018   during aneurysm surgery   Allergies  Allergen Reactions  .  Sulfa Antibiotics Swelling    Causes swelling on the face.  . Shellfish Allergy Other (See Comments)    Stomach upset    Current Outpatient Medications on File Prior to Visit  Medication Sig Dispense Refill  . acetaminophen (TYLENOL) 325 MG tablet Take 1-2 tablets (325-650 mg total) by mouth every 4 (four) hours as needed for mild pain.    Marland Kitchen amLODipine (NORVASC) 10 MG tablet Take 1 tablet (10 mg total) by mouth daily. 90 tablet 1  . baclofen (LIORESAL) 10 MG tablet Take 1 tablet (10 mg total) by mouth 3 (three) times daily. 90 tablet 3  . cetirizine (ZYRTEC) 10 MG tablet Take 1 tablet (10 mg total) by mouth daily. 30 tablet 2  . fluticasone (FLONASE) 50 MCG/ACT nasal spray Place 1 spray into both nostrils daily.  2  . levETIRAcetam (KEPPRA) 1000 MG tablet Take 1 tablet (1,000 mg total) by mouth 2 (two) times daily. 60 tablet 12  . montelukast (SINGULAIR) 10 MG tablet TAKE 1 TABLET BY MOUTH ONCE DAILY AT BEDTIME 30 tablet 2  . Multiple Vitamin (MULTIVITAMIN WITH MINERALS) TABS tablet Take 1 tablet by mouth daily.    . sodium chloride (OCEAN) 0.65 % SOLN nasal spray Place 1 spray into both nostrils 2 (two) times daily before lunch and supper.  0   No current facility-administered medications on file prior to visit.    Observations/Objective: Awake, alert, oriented x3 Not in acute distress  Assessment and Plan: 1. Essential hypertension Controlled Continue current regimen Counseled on Diabetic diet, my plate method, [id number] minutes of moderate intensity  exercise/week Blood sugar logs with fasting goals of 80-120 mg/dl, random of less than 180 and in the event of sugars less than 60 mg/dl or greater than 400 mg/dl encouraged to notify the clinic. Advised on the need for annual eye exams, annual foot exams, Pneumonia vaccine. - amLODipine (NORVASC) 10 MG tablet; Take 1 tablet (10 mg total) by mouth daily.  Dispense: 90 tablet; Refill: 1  2. Seizure ([place]) Stable Continue Keppra Follow-up  with neurology  3. 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 Tobacco abuse Spent 3 minutes counseling on cessation and she is not ready to quit at this time  5. Allergic rhinitis, unspecified seasonality, unspecified trigger Stable - cetirizine (ZYRTEC) 10 MG tablet; Take 1 tablet (10 mg total) by mouth daily.  Dispense: 30 tablet; Refill: 6 - fluticasone (FLONASE) 50 MCG/ACT nasal spray; Place 1 spray into both nostrils daily.  Dispense: 16 g; Refill: 6 - montelukast (SINGULAIR) 10 MG tablet; Take 1 tablet (10 mg total) by mouth at bedtime.  Dispense: 30 tablet; Refill: 6   Follow Up Instructions: 6 months   I discussed the assessment and treatment plan with the patient. The patient was provided an opportunity to ask questions and all were answered. The patient agreed with the plan and demonstrated an understanding of the instructions.   The patient was advised to call back or seek an in-person evaluation if the symptoms worsen or if the condition fails to improve as anticipated.     I provided 17 minutes total of non-face-to-face time during this encounter including median intraservice time, reviewing previous notes, investigations, ordering medications, medical decision making, coordinating care and patient verbalized understanding at the end of the visit.     Charlott Rakes, MD, FAAFP. [facility] and [place], [place]   07/30/2019, 3:09 PM

## 2019-07-31 ENCOUNTER — Encounter: Payer: Self-pay | Admitting: Family Medicine

## 2019-08-29 IMAGING — DX DG CHEST 1V PORT
1 series · 1 of 1 positions shown · non-contrast
Comparison: 05/14/2018

CLINICAL DATA: Respiratory failure

EXAM:
PORTABLE CHEST 1 VIEW

[chest]
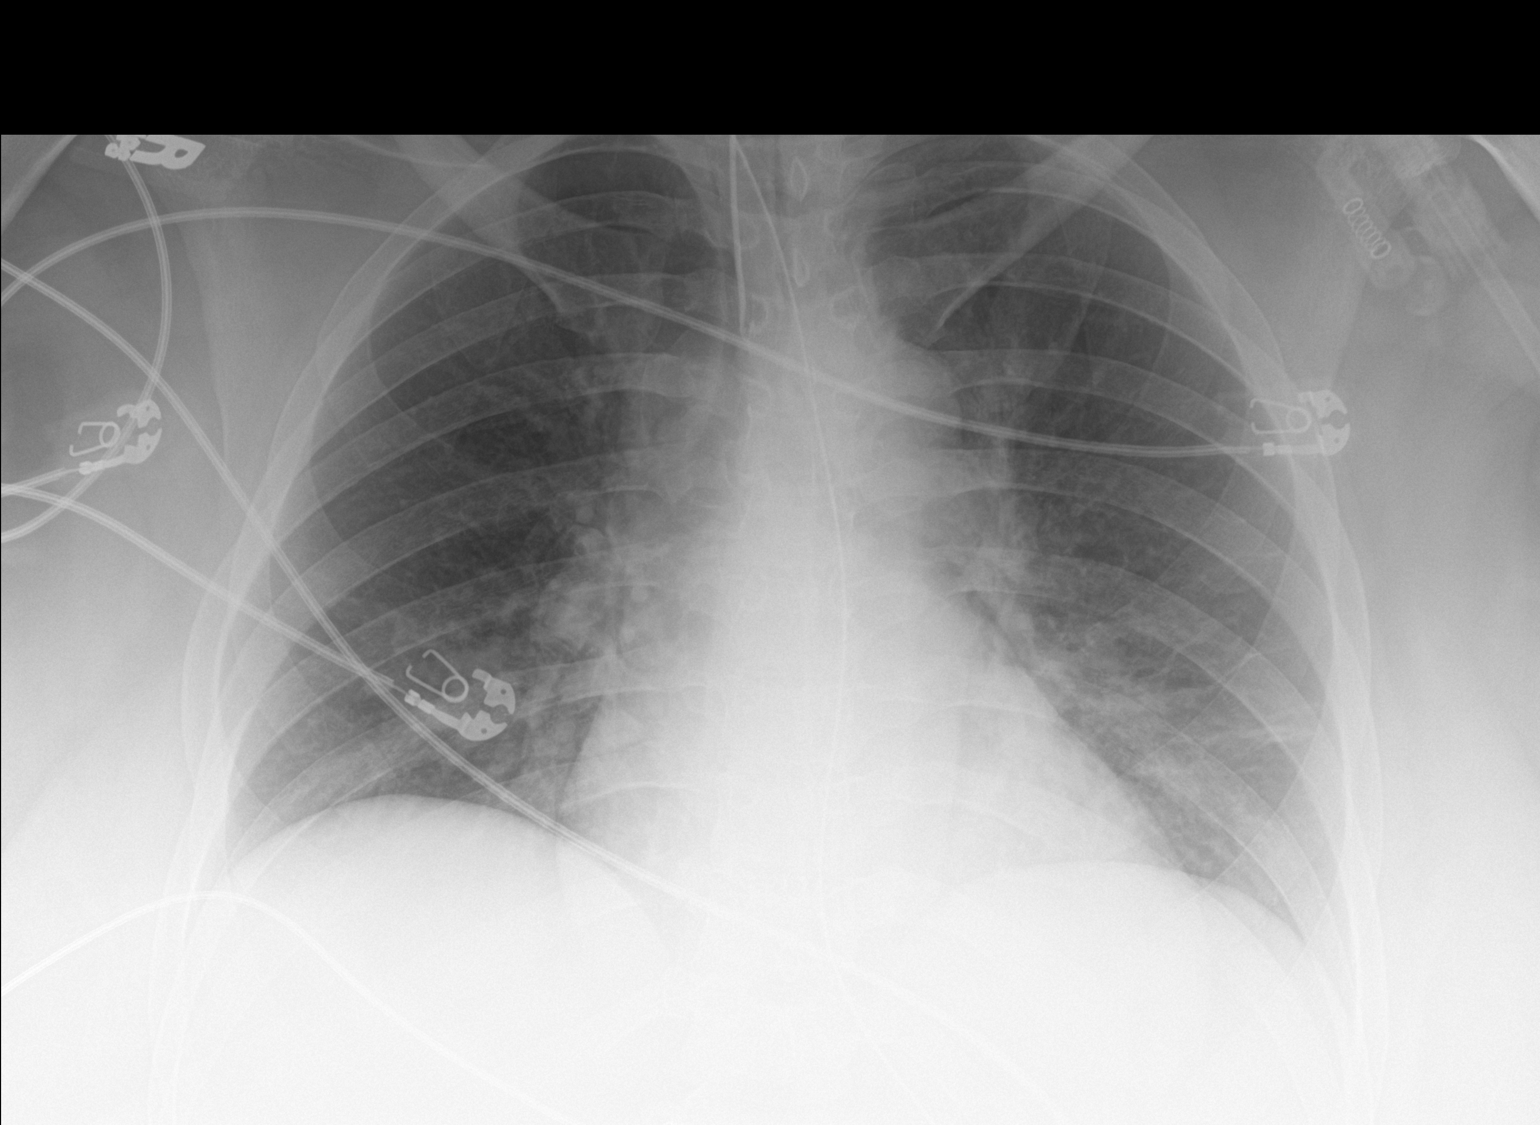

[1 of 1 positions shown; findings below may reference images not displayed]

FINDINGS: Cardiac shadow is stable. Endotracheal tube and nasogastric catheter
are again noted and stable. Persistent left basilar
infiltrate/atelectasis is noted although improved when compared with
the prior exam with near complete resolution. Continued follow-up is
recommended.
IMPRESSION: Near complete resolution of left basilar infiltrative density.

## 2019-09-19 IMAGING — MR MR MRA EXTREM LOWER WO CM*R*
4 of 9 series · 12 of 40 positions shown · IV contrast (agent unspecified)
Comparison: None.

CLINICAL DATA: Mass adjacent to knee

EXAM:
MR ANGIOGRAPHY BILATERAL LOWER EXTREMITIES
TECHNIQUE: Multiplanar multisequence MR imaging of the lower extremities was
performed without contrast. Multiplanar MR image reconstructions
including MIPs were obtained to evaluate the vascular anatomy.
CONTRAST:  None

[Series 3: qiss_trufi_tra_ecg · axial · right · 3.0mm · 0.50mm/px · z∈[-121,-3]mm · 3 of 50 slices shown (1 of 4)]
[im 1/50]
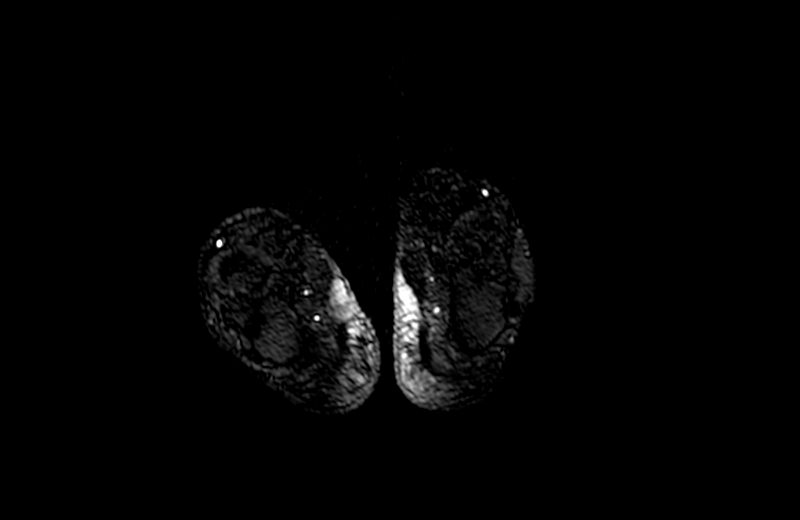
[im 25/50]
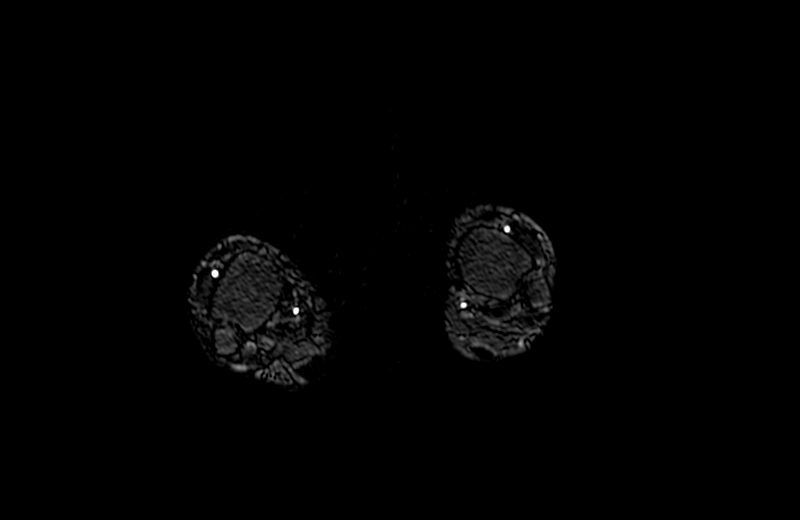
[im 50/50]
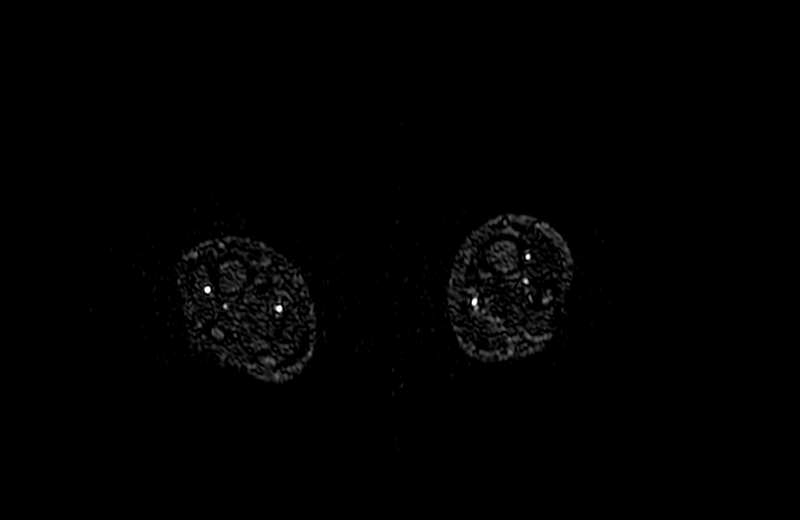

[Series 4: qiss_trufi_tra_ecg · axial · right · 3.0mm · 0.50mm/px · z∈[+0,+118]mm · 3 of 50 slices shown (2 of 4)]
[im 1/50]
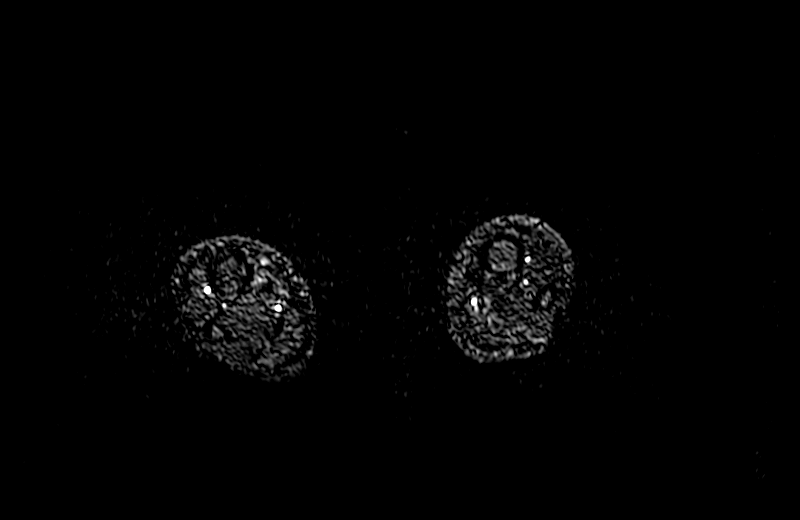
[im 25/50]
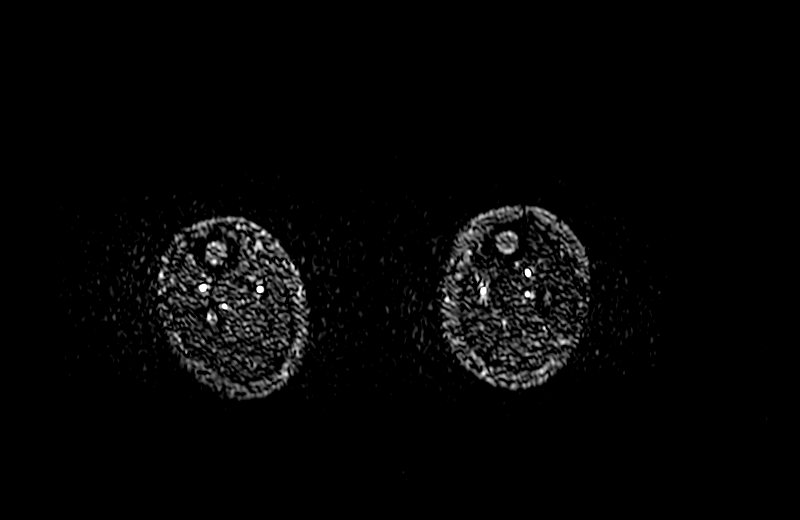
[im 50/50]
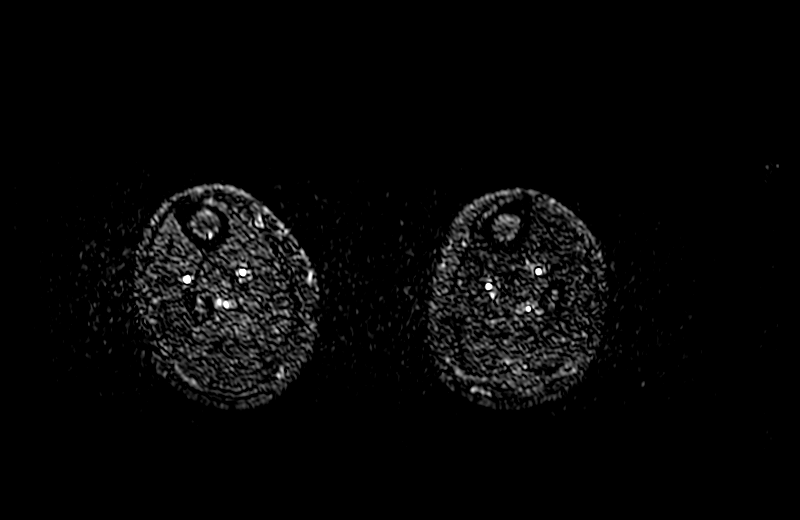

[Series 5: qiss_trufi_tra_ecg · axial · right · 3.0mm · 0.50mm/px · z∈[+121,+238]mm · 3 of 50 slices shown (3 of 4)]
[im 1/50]
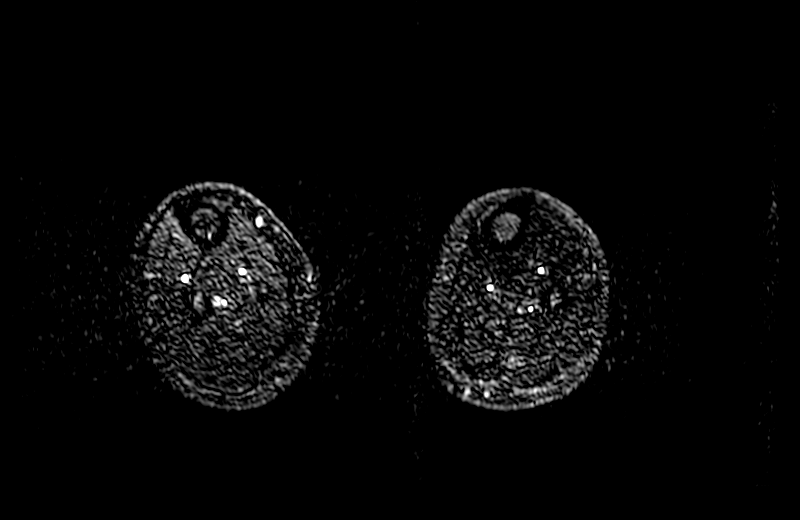
[im 25/50]
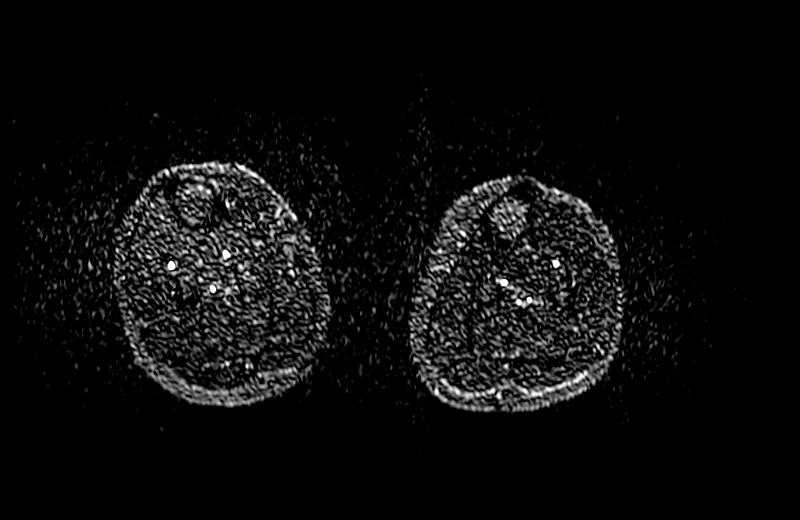
[im 50/50]
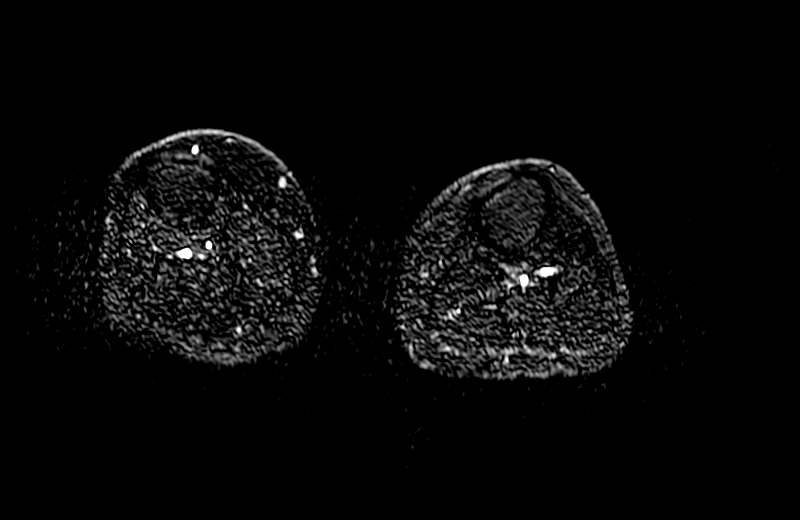

[Series 6: qiss_trufi_tra_ecg · axial · right · 3.0mm · 0.50mm/px · z∈[+241,+359]mm · 3 of 50 slices shown (4 of 4)]
[im 1/50]
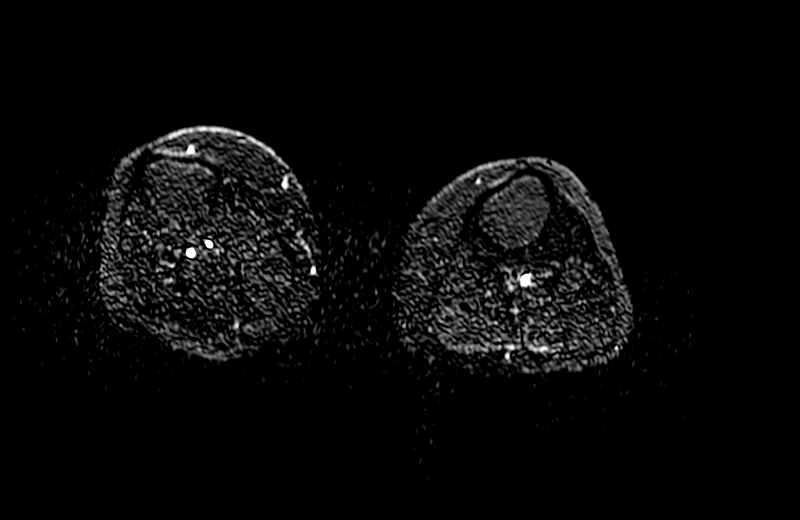
[im 25/50]
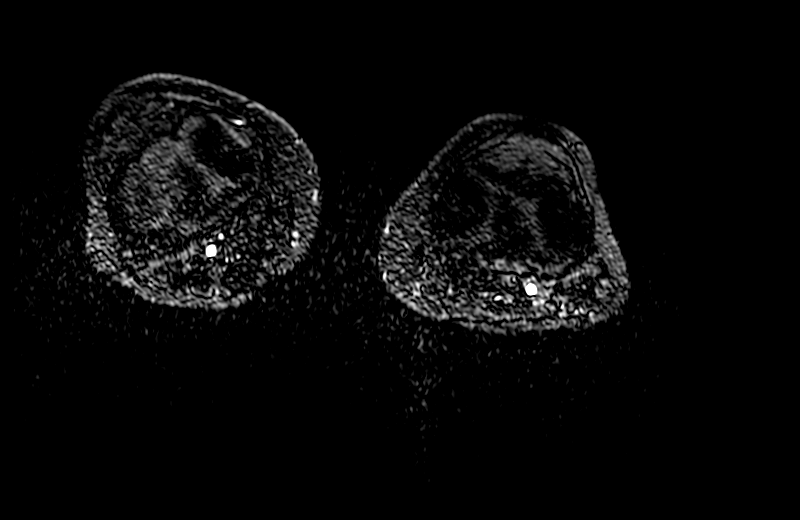
[im 50/50]
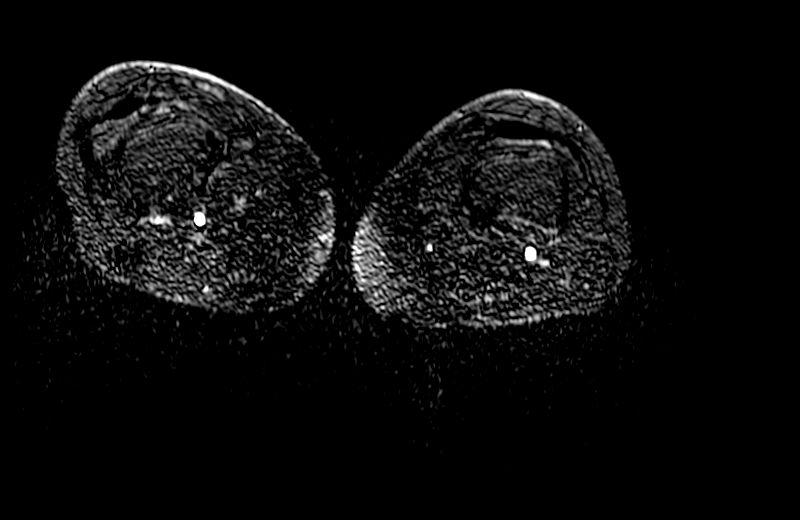

[12 of 40 positions shown; findings below may reference images not displayed]

FINDINGS: The distal aorta is patent. Bilateral common, internal, and external
iliac arteries are patent and nonaneurysmal.

Bilateral common femoral, superficial femoral, and profunda femoral
arteries are patent and nonaneurysmal.

Popliteal arteries are nonaneurysmal and patent. The origin of the
left anterior tibial artery does not demonstrate signal. This may be
artifactual or a true area of narrowing. The remainder of the left
anterior tibial artery is widely patent. Otherwise, there is wide
patency of the tibial vessels bilaterally for 3 vessel runoff on the
right and patency of the left posterior tibial and peroneal arteries
on the left.

There is no evidence of popliteal artery aneurysm.

This study was a time-of-flight noncontrast MRA. Delineation of
anatomy is extremely limited. A mass associated with the right knee
is not clearly defined.
IMPRESSION: VASCULAR

Aorta, iliac arteries, femoral arteries, and popliteal arteries are
widely patent. There is no signal at the origin of the left anterior
tibial artery which may simply represent artifact. There is
otherwise wide patency of the tibial arteries. There is no evidence
of popliteal artery aneurysm.

NON-VASCULAR

This study was not optimized for delineation of anatomy. No obvious
mass associated with the right knee is visualized. If there is
concern for evaluation of an underlying mass, a dedicated MRI of the
knees is recommended.

## 2019-09-29 ENCOUNTER — Telehealth: Payer: Self-pay | Admitting: Neurology

## 2019-09-29 NOTE — Telephone Encounter (Signed)
Patient's aunt Megan Mans) called and reported a seizure. On Saturday- 11.18 AM. The patient had returned to baseline. I advised to speak to primary Neurologist today.

## 2019-09-29 NOTE — Telephone Encounter (Signed)
LVM informing patient of MD's reply. Advised she call for any questions, seizure activity. Left #.

## 2019-09-29 NOTE — Telephone Encounter (Signed)
LVM informing patient of call received form Ms Kenton Kingfisher. Requested she call back to discuss and move her follow up sooner than May.

## 2019-09-29 NOTE — Telephone Encounter (Addendum)
Called patient who stated she had seizure Sat during her sleep. She missed her night time dose Sat. She stated she "feels ok today". She has FU on 11/04/19. She has not missed any doses since then. I advised will discuss with Dr Leta Baptist and call her if he wants to see her sooner or has other advice. Patient verbalized understanding, appreciation.

## 2019-09-29 NOTE — Telephone Encounter (Signed)
Pt returned call. Please call back when available. 

## 2019-09-29 NOTE — Telephone Encounter (Signed)
If missed dose, then continue on regular LEV dosing. Monitor and follow up in May 2021. -VRP

## 2019-10-02 ENCOUNTER — Ambulatory Visit: Payer: Medicaid Other | Attending: Internal Medicine

## 2019-10-02 DIAGNOSIS — Z23 Encounter for immunization: Secondary | ICD-10-CM

## 2019-10-02 NOTE — Progress Notes (Signed)
   Covid-19 Vaccination Clinic  Name:  [name] [name]    MRN: [id number] DOB: 1986/06/10  10/02/2019  [name] was observed post Covid-19 immunization for 30 minutes based on pre-vaccination screening without incident. She was provided with Vaccine Information Sheet and instruction to access the V-Safe system.   [name] was instructed to call 911 with any severe reactions post vaccine: Marland Kitchen Difficulty breathing  . Swelling of face and throat  . A fast heartbeat  . A bad rash all over body  . Dizziness and weakness   Immunizations Administered    Name Date Dose VIS Date Route   Pfizer COVID-19 Vaccine 10/02/2019  1:54 PM 0.3 mL 05/30/2019 Intramuscular   Manufacturer: [place]   Lot: [id number]   [place]: [id number]

## 2019-10-27 ENCOUNTER — Ambulatory Visit: Payer: Medicaid Other | Attending: Internal Medicine

## 2019-10-27 DIAGNOSIS — Z23 Encounter for immunization: Secondary | ICD-10-CM

## 2019-10-27 NOTE — Progress Notes (Signed)
   Covid-19 Vaccination Clinic  Name:  [name] [name]    MRN: [id number] DOB: 09-25-1985  10/27/2019  [name] [name] was observed post Covid-19 immunization for 30 minutes based on pre-vaccination screening without incident. She was provided with Vaccine Information Sheet and instruction to access the V-Safe system.   [name] [name] was instructed to call 911 with any severe reactions post vaccine: Marland Kitchen Difficulty breathing  . Swelling of face and throat  . A fast heartbeat  . A bad rash all over body  . Dizziness and weakness   Immunizations Administered    Name Date Dose VIS Date Route   Pfizer COVID-19 Vaccine 10/27/2019  1:51 PM 0.3 mL 08/13/2018 Intramuscular   Manufacturer: [place]   Lot: [id number]   [place]: [id number]

## 2019-11-03 NOTE — Patient Instructions (Addendum)
We will continue levetiracetam 1000mg  twice daily. Try to avoid missed doses. Stay well hydrated. Focus on healthy lifestyle habits. Continue close follow up with PCP.   Follow up with me in 1 year   Stroke Prevention Some medical conditions and lifestyle choices can lead to a higher risk for a stroke. You can help to prevent a stroke by making nutrition, lifestyle, and other changes. What nutrition changes can be made?   Eat healthy foods. ? Choose foods that are high in fiber. These include:  Fresh fruits.  Fresh vegetables.  Whole grains. ? Eat at least 5 or more servings of fruits and vegetables each day. Try to fill half of your plate at each meal with fruits and vegetables. ? Choose lean protein foods. These include:  Lowfat (lean) cuts of meat.  Chicken without skin.  Fish.  Tofu.  Beans.  Nuts. ? Eat low-fat dairy products. ? Avoid foods that:  Are high in salt (sodium).  Have saturated fat.  Have trans fat.  Have cholesterol.  Are processed.  Are premade.  Follow eating guidelines as told by your doctor. These may include: ? Reducing how many calories you eat and drink each day. ? Limiting how much salt you eat or drink each day to 1,500 milligrams (mg). ? Using only healthy fats for cooking. These include:  Olive oil.  Canola oil.  Sunflower oil. ? Counting how many carbohydrates you eat and drink each day. What lifestyle changes can be made?  Try to stay at a healthy weight. Talk to your doctor about what a good weight is for you.  Get at least 30 minutes of moderate physical activity at least 5 days a week. This can include: ? Fast walking. ? Biking. ? Swimming.  Do not use any products that have nicotine or tobacco. This includes cigarettes and e-cigarettes. If you need help quitting, ask your doctor. Avoid being around tobacco smoke in general.  Limit how much alcohol you drink to no more than 1 drink a day for nonpregnant  women and 2 drinks a day for men. One drink equals 12 oz of beer, 5 oz of wine, or 1 oz of hard liquor.  Do not use drugs.  Avoid taking birth control pills. Talk to your doctor about the risks of taking birth control pills if: ? You are over 22 years old. ? You smoke. ? You get migraines. ? You have had a blood clot. What other changes can be made?  Manage your cholesterol. ? It is important to eat a healthy diet. ? If your cholesterol cannot be managed through your diet, you may also need to take medicines. Take medicines as told by your doctor.  Manage your diabetes. ? It is important to eat a healthy diet and to exercise regularly. ? If your blood sugar cannot be managed through diet and exercise, you may need to take medicines. Take medicines as told by your doctor.  Control your high blood pressure (hypertension). ? Try to keep your blood pressure below 130/80. This can help lower your risk of stroke. ? It is important to eat a healthy diet and to exercise regularly. ? If your blood pressure cannot be managed through diet and exercise, you may need to take medicines. Take medicines as told by your doctor. ? Ask your doctor if you should check your blood pressure at home. ? Have your blood pressure checked every year. Do this even if your blood pressure is  normal.  Talk to your doctor about getting checked for a sleep disorder. Signs of this can include: ? Snoring a lot. ? Feeling very tired.  Take over-the-counter and prescription medicines only as told by your doctor. These may include aspirin or blood thinners (antiplatelets or anticoagulants).  Make sure that any other medical conditions you have are managed. Where to find more information  American Stroke Association: www.strokeassociation.org  National Stroke Association: www.stroke.org Get help right away if:  You have any symptoms of stroke. "BE FAST" is an easy way to remember the main warning signs: ? B -  Balance. Signs are dizziness, sudden trouble walking, or loss of balance. ? E - Eyes. Signs are trouble seeing or a sudden change in how you see. ? F - Face. Signs are sudden weakness or loss of feeling of the face, or the face or eyelid drooping on one side. ? A - Arms. Signs are weakness or loss of feeling in an arm. This happens suddenly and usually on one side of the body. ? S - Speech. Signs are sudden trouble speaking, slurred speech, or trouble understanding what people say. ? T - Time. Time to call emergency services. Write down what time symptoms started.  You have other signs of stroke, such as: ? A sudden, very bad headache with no known cause. ? Feeling sick to your stomach (nausea). ? Throwing up (vomiting). ? Jerky movements you cannot control (seizure). These symptoms may represent a serious problem that is an emergency. Do not wait to see if the symptoms will go away. Get medical help right away. Call your local emergency services (911 in the [place] Do not drive yourself to the hospital. Summary  You can prevent a stroke by eating healthy, exercising, not smoking, drinking less alcohol, and treating other health problems, such as diabetes, high blood pressure, or high cholesterol.  Do not use any products that contain nicotine or tobacco, such as cigarettes and e-cigarettes.  Get help right away if you have any signs or symptoms of a stroke. This information is not intended to replace advice given to you by your health care provider. Make sure you discuss any questions you have with your health care provider. Document Revised: 08/01/2018 Document Reviewed: 09/06/2016 Elsevier Patient Education  [place].   Seizure, Adult A seizure is a sudden burst of abnormal electrical activity in the brain. Seizures usually last from 30 seconds to 2 minutes. They can cause many different symptoms. Usually, seizures are not harmful unless they last a long time. What are the  causes? Common causes of this condition include:  Fever or infection.  Conditions that affect the brain, such as: ? A brain abnormality that you were born with. ? A brain or head injury. ? Bleeding in the brain. ? A tumor. ? Stroke. ? Brain disorders such as autism or cerebral palsy.  Low blood sugar.  Conditions that are passed from parent to child (are inherited).  Problems with substances, such as: ? Having a reaction to a drug or a medicine. ? Suddenly stopping the use of a substance (withdrawal). In some cases, the cause may not be known. A person who has repeated seizures over time without a clear cause has a condition called epilepsy. What increases the risk? You are more likely to get this condition if you have:  A family history of epilepsy.  Had a seizure in the past.  A brain disorder.  A history of head injury, lack of  oxygen at birth, or strokes. What are the signs or symptoms? There are many types of seizures. The symptoms vary depending on the type of seizure you have. Examples of symptoms during a seizure include:  Shaking (convulsions).  Stiffness in the body.  Passing out (losing consciousness).  Head nodding.  Staring.  Not responding to sound or touch.  Loss of bladder control and bowel control. Some people have symptoms right before and right after a seizure happens. Symptoms before a seizure may include:  Fear.  Worry (anxiety).  Feeling like you may vomit (nauseous).  Feeling like the room is spinning (vertigo).  Feeling like you saw or heard something before (dj vu).  Odd tastes or smells.  Changes in how you see. You may see flashing lights or spots. Symptoms after a seizure happens can include:  Confusion.  Sleepiness.  Headache.  Weakness on one side of the body. How is this treated? Most seizures will stop on their own in under 5 minutes. In these cases, no treatment is needed. Seizures that last longer than 5  minutes will usually need treatment. Treatment can include:  Medicines given through an IV tube.  Avoiding things that are known to cause your seizures. These can include medicines that you take for another condition.  Medicines to treat epilepsy.  Surgery to stop the seizures. This may be needed if medicines do not help. Follow these instructions at home: Medicines  Take over-the-counter and prescription medicines only as told by your doctor.  Do not eat or drink anything that may keep your medicine from working, such as alcohol. Activity  Do not do any activities that would be dangerous if you had another seizure, like driving or swimming. Wait until your doctor says it is safe for you to do them.  If you live in the [place] ask your local DMV (department of motor vehicles) when you can drive.  Get plenty of rest. Teaching others Teach friends and family what to do when you have a seizure. They should:  Lay you on the ground.  Protect your head and body.  Loosen any tight clothing around your neck.  Turn you on your side.  Not hold you down.  Not put anything into your mouth.  Know whether or not you need emergency care.  Stay with you until you are better.  General instructions  Contact your doctor each time you have a seizure.  Avoid anything that gives you seizures.  Keep a seizure diary. Write down: ? What you think caused each seizure. ? What you remember about each seizure.  Keep all follow-up visits as told by your doctor. This is important. Contact a doctor if:  You have another seizure.  You have seizures more often.  There is any change in what happens during your seizures.  You keep having seizures with treatment.  You have symptoms of being sick or having an infection. Get help right away if:  You have a seizure that: ? Lasts longer than 5 minutes. ? Is different than seizures you had before. ? Makes it harder to breathe. ? Happens  after you hurt your head.  You have any of these symptoms after a seizure: ? Not being able to speak. ? Not being able to use a part of your body. ? Confusion. ? A bad headache.  You have two or more seizures in a row.  You do not wake up right after a seizure.  You get hurt during a seizure.  These symptoms may be an emergency. Do not wait to see if the symptoms will go away. Get medical help right away. Call your local emergency services (911 in the [place] Do not drive yourself to the hospital. Summary  Seizures usually last from 30 seconds to 2 minutes. Usually, they are not harmful unless they last a long time.  Do not eat or drink anything that may keep your medicine from working, such as alcohol.  Teach friends and family what to do when you have a seizure.  Contact your doctor each time you have a seizure. This information is not intended to replace advice given to you by your health care provider. Make sure you discuss any questions you have with your health care provider. Document Revised: 08/23/2018 Document Reviewed: 08/23/2018 Elsevier Patient Education  [place].

## 2019-11-03 NOTE — Progress Notes (Signed)
PATIENT: [name] [name] DOB: Jul 25, 1985  REASON FOR VISIT: follow up HISTORY FROM: patient  Chief Complaint  Patient presents with  . Follow-up    with mother rm 1, last sz was 1 month ago   . Seizures     HISTORY OF PRESENT ILLNESS: Today 11/06/19 [name] [name] is a 34 y.o. female here today for follow up for seizures. She continues levetiracetam 1000mg  twice daily. She called with concerns of seizure activity in her sleep in 09/2019 after missing evening dose of levetiracetam. She does admit to occasional missed doses but does not feel this occurs very often. She is feeling well today. She is tolerating medication well.   She continues close follow up with PCP for stroke prevention. She denies any concerns of stroke like symptoms. BP is normal.   HISTORY: (copied from my note on 05/07/2019)  [name] [name] is a 34 y.o. 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 In 10/2018, patient suffered a seizure in her sleep. She was started on levetiracetam 500mg  twice daily. She has tolerated medication without obvious adverse effects. Unfortunately, she had a breakthrough seizure on 10/16. ER workup concerning for UTI. Dr Leta Baptist increased levetiracetam to 1000mg  twice daily. She is tolerating dose increase well and denies seizure activity. She takes medication daily at 10am and 10pm. No missed doses. She is followed closely by PCP. Blood pressures have been normal. No stroke like symptoms. She has residual right sided weakness that is improving with time. She does not drive. She has two small children. She lives with her significant other. She has a great support system. Her mother accompanies her today.   HISTORY: (copied from Dr Gladstone Lighter note on  01/01/2019)  34 year old female here for evaluation of seizure.  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 This was treated with craniotomy and clipping surgery procedure. Hospital course was complicated with left ACA ischemic infarct. Patient had resultant right-sided weakness. Patient then transition to inpatient rehabilitation.  11/09/2018 patient had seizure in her sleep and went to the emergency room. Apparently patient was having convulsions. She was slightly confused after waking up. CT and CTA scans were performed which showed no acute findings. Patient was started on levetiracetam 5 mg twice a day and discharged home.  Since that time no further seizures. No side effects of medication.   REVIEW OF SYSTEMS: Out of a complete 14 system review of symptoms, the patient complains only of the following symptoms, seizures and all other reviewed systems are negative.   ALLERGIES: Allergies  Allergen Reactions  . Sulfa Antibiotics Swelling    Causes swelling on the face.  . Shellfish Allergy Other (See Comments)    Stomach upset    HOME MEDICATIONS: Outpatient Medications Prior to Visit  Medication Sig Dispense Refill  . acetaminophen (TYLENOL) 325 MG tablet Take 1-2 tablets (325-650 mg total) by mouth every 4 (four) hours as needed for mild pain.    Marland Kitchen amLODipine (NORVASC) 10 MG tablet Take 1 tablet (10 mg total) by mouth daily. 90 tablet 1  . cetirizine (ZYRTEC) 10 MG tablet Take 1 tablet (10 mg total) by mouth daily. 30 tablet 6  . fluticasone (FLONASE) 50 MCG/ACT nasal spray Place 1 spray into  both nostrils daily. 16 g 6  . montelukast (SINGULAIR) 10 MG tablet Take 1 tablet (10 mg total) by mouth at bedtime. 30 tablet 6  . Multiple Vitamin (MULTIVITAMIN WITH MINERALS) TABS tablet Take 1 tablet by mouth daily.    . sodium chloride (OCEAN) 0.65 % SOLN  nasal spray Place 1 spray into both nostrils 2 (two) times daily before lunch and supper.  0  . levETIRAcetam (KEPPRA) 1000 MG tablet Take 1 tablet (1,000 mg total) by mouth 2 (two) times daily. 60 tablet 12  . baclofen (LIORESAL) 10 MG tablet Take 1 tablet (10 mg total) by mouth 3 (three) times daily. 90 tablet 3   No facility-administered medications prior to visit.    PAST MEDICAL HISTORY: Past Medical History:  Diagnosis Date  . Asthma, allergic    uses inhaler prn - infrequently  . Bilateral thoracic back pain 09/13/2015  . Eczema   . Hypertension   . Lactose intolerance   . Mature cystic teratoma of ovary   . Mood swings 06/01/2015  . Seasonal allergies   . Seizures ([place])   . Stroke ([place]) 04/2018   during aneurysm surgery    PAST SURGICAL HISTORY: Past Surgical History:  Procedure Laterality Date  . CRANIOTOMY N/A 05/10/2018   Procedure: 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;  Surgeon: Consuella Lose, MD;  Location: [facility];  Service: Neurosurgery;  Laterality: N/A;  . IR 3D INDEPENDENT WKST  05/10/2018  . IR ANGIO INTRA EXTRACRAN SEL INTERNAL CAROTID BILAT MOD SED  05/10/2018  . IR ANGIO INTRA EXTRACRAN SEL INTERNAL CAROTID BILAT MOD SED  05/30/2019  . IR ANGIO VERTEBRAL SEL VERTEBRAL BILAT MOD SED  05/10/2018  . IR ANGIO VERTEBRAL SEL VERTEBRAL UNI R MOD SED  05/30/2019  . IR US GUIDE VASC ACCESS RIGHT  05/30/2019  . RADIOLOGY WITH ANESTHESIA N/A 05/10/2018   Procedure: EMBOLIZATION OF ANEURSYM;  Surgeon: Consuella Lose, MD;  Location: [facility];  Service: Radiology;  Laterality: N/A;  . TUBAL LIGATION N/A 12/15/2016   Procedure: POST PARTUM TUBAL LIGATION;  Surgeon: Mora Bellman, MD;  Location: [facility];  Service: Gynecology;  Laterality: N/A;  . WISDOM TOOTH EXTRACTION      FAMILY HISTORY: Family History  Adopted: Yes  Problem Relation Age of Onset  . Hypertension Father   . Hypertension Maternal Aunt   . Hypertension  Maternal Uncle   . Hypertension Maternal Grandmother   . Eczema Maternal Grandmother   . Hypertension Maternal Grandfather   . Hypertension Paternal Grandmother   . Hypertension Mother   . Eczema Mother     SOCIAL HISTORY: Social History   Socioeconomic History  . Marital status: Single    Spouse name: Not on file  . Number of children: 2  . Years of education: Not on file  . Highest education level: Some college, no degree  Occupational History  . Not on file  Tobacco Use  . Smoking status: Current Every Day Smoker    Packs/day: 0.25    Types: Cigarettes  . Smokeless tobacco: Former Network engineer and Sexual Activity  . Alcohol use: No  . Drug use: Not Currently    Types: Marijuana    Comment: used to yrs ago  . Sexual activity: Not Currently    Birth control/protection: None  Other Topics Concern  . Not on file  Social History Narrative   Lives with 2 children   Coffee 2 cups daily   Social Determinants of Health  Financial Resource Strain:   . Difficulty of Paying Living Expenses:   Food Insecurity:   . Worried About Charity fundraiser in the Last Year:   . Arboriculturist in the Last Year:   Transportation Needs:   . Film/video editor (Medical):   Marland Kitchen Lack of Transportation (Non-Medical):   Physical Activity:   . Days of Exercise per Week:   . Minutes of Exercise per Session:   Stress:   . Feeling of Stress :   Social Connections:   . Frequency of Communication with Friends and Family:   . Frequency of Social Gatherings with Friends and Family:   . Attends Religious Services:   . Active Member of Clubs or Organizations:   . Attends Archivist Meetings:   Marland Kitchen Marital Status:   Intimate Partner Violence:   . Fear of Current or Ex-Partner:   . Emotionally Abused:   Marland Kitchen Physically Abused:   . Sexually Abused:       PHYSICAL EXAM  Vitals:   11/04/19 1256  BP: 113/81  Pulse: 64  Weight: 154 lb (69.9 kg)  Height: 5\' 7"  (1.702 m)    Body mass index is 24.12 kg/m.  Generalized: Well developed, in no acute distress  Cardiology: normal rate and rhythm, no murmur noted Respiratory: clear to auscultation bilaterally  Neurological examination  Mentation: Alert oriented to time, place, history taking. Follows all commands speech and language fluent Cranial nerve II-XII: Pupils were equal round reactive to light. Extraocular movements were full, visual field were full on confrontational test. Facial sensation and strength were normal. Uvula tongue midline. Head turning and shoulder shrug  were normal and symmetric. Motor: The motor testing reveals 5 over 5 strength of all 4 extremities. Good symmetric motor tone is noted throughout.  Sensory: Sensory testing is intact to soft touch on all 4 extremities. No evidence of extinction is noted.  Coordination: Cerebellar testing reveals good finger-nose-finger and heel-to-shin bilaterally.  Gait and station: Gait is normal. Reflexes: Deep tendon reflexes are symmetric and normal bilaterally.    DIAGNOSTIC DATA (LABS, IMAGING, TESTING) - I reviewed patient records, labs, notes, testing and imaging myself where available.  No flowsheet data found.   Lab Results  Component Value Date   WBC 3.8 (L) 05/30/2019   HGB 14.4 05/30/2019   HCT 42.1 05/30/2019   MCV 96.3 05/30/2019   PLT 170 05/30/2019      Component Value Date/Time   NA 142 05/30/2019 0722   NA 142 08/01/2017 0934   K 3.4 (L) 05/30/2019 0722   CL 108 05/30/2019 0722   CO2 24 05/30/2019 0722   GLUCOSE 90 05/30/2019 0722   GLUCOSE 70 08/25/2014 1214   BUN <5 (L) 05/30/2019 0722   BUN 6 08/01/2017 0934   CREATININE 0.97 05/30/2019 0722   CREATININE 0.40 (L) 06/29/2016 1504   CALCIUM 9.4 05/30/2019 0722   PROT 6.5 11/09/2018 1156   PROT 7.1 08/01/2017 0934   ALBUMIN 3.4 (L) 11/09/2018 1156   ALBUMIN 4.4 08/01/2017 0934   AST 25 11/09/2018 1156   ALT 21 11/09/2018 1156   ALKPHOS 79 11/09/2018 1156    BILITOT 0.2 (L) 11/09/2018 1156   BILITOT 0.4 08/01/2017 0934   GFRNONAA >60 05/30/2019 0722   GFRNONAA >89 07/02/2015 0945   GFRAA >60 05/30/2019 0722   GFRAA >89 07/02/2015 0945   Lab Results  Component Value Date   CHOL 168 08/01/2017   HDL 53 08/01/2017   LDLCALC  105 (H) 08/01/2017   TRIG 133 05/13/2018   CHOLHDL 3.2 08/01/2017   Lab Results  Component Value Date   HGBA1C 4.3 (L) 05/28/2018   No results found for: VITAMINB12 Lab Results  Component Value Date   TSH 0.82 07/20/2016       ASSESSMENT AND PLAN 34 y.o. year old female  has a past medical history of Asthma, allergic, Bilateral thoracic back pain (09/13/2015), Eczema, Hypertension, Lactose intolerance, Mature cystic teratoma of ovary, Mood swings (06/01/2015), Seasonal allergies, Seizures ([place]), and Stroke ([place]) (04/2018). here with     ICD-10-CM   1. Seizure disorder ([place])  G40.909   2. History of spontaneous subarachnoid intracranial hemorrhage  Z86.79   3. H/O ischemic left ACA stroke  Z66.73     [name] [name] is doing very well. We will continue levetiracetam 1000mg  twice daily. She was educated on the importance of medication compliance. She will continue close follow up with PCP. She was encouraged to continue healthy lifestyle habits. She will follow up in 1 year, sooner if needed. She verbalizes understanding and agreement with this plan.    No orders of the defined types were placed in this encounter.    Meds ordered this encounter  Medications  . levETIRAcetam (KEPPRA) 1000 MG tablet    Sig: Take 1 tablet (1,000 mg total) by mouth 2 (two) times daily.    Dispense:  180 tablet    Refill:  4    Order Specific Question:   Supervising Provider    Answer:   Melvenia Beam [id number]      I spent 15 minutes with the patient. 50% of this time was spent counseling and educating patient on plan of care and medications.    Debbora Presto, FNP-C 11/06/2019, 8:45 AM [facility] [address] [place], [place] [address] [phone]

## 2019-11-04 ENCOUNTER — Encounter: Payer: Self-pay | Admitting: Family Medicine

## 2019-11-04 ENCOUNTER — Ambulatory Visit (INDEPENDENT_AMBULATORY_CARE_PROVIDER_SITE_OTHER): Payer: Medicaid Other | Admitting: Family Medicine

## 2019-11-04 ENCOUNTER — Other Ambulatory Visit: Payer: Self-pay

## 2019-11-04 VITALS — BP 113/81 | HR 64 | Ht 67.0 in | Wt 154.0 lb

## 2019-11-04 DIAGNOSIS — G40909 Epilepsy, unspecified, not intractable, without status epilepticus: Secondary | ICD-10-CM

## 2019-11-04 DIAGNOSIS — Z8679 Personal history of other diseases of the circulatory system: Secondary | ICD-10-CM

## 2019-11-04 DIAGNOSIS — Z8673 Personal history of transient ischemic attack (TIA), and cerebral infarction without residual deficits: Secondary | ICD-10-CM

## 2019-11-04 MED ORDER — LEVETIRACETAM 1000 MG PO TABS: 1000.0000 mg | ORAL_TABLET | Freq: Two times a day (BID) | ORAL | 4 refills | Status: DC

## 2019-11-06 ENCOUNTER — Encounter: Payer: Self-pay | Admitting: Family Medicine

## 2019-11-20 NOTE — Progress Notes (Signed)
I reviewed note and agree with plan.   Penni Bombard, MD [id number], [id number] PM Certified in Neurology, Neurophysiology and Neuroimaging  [facility] [address], [place], [place] [address] [phone]

## 2020-01-07 ENCOUNTER — Other Ambulatory Visit: Payer: Self-pay

## 2020-01-07 ENCOUNTER — Encounter: Payer: Self-pay | Admitting: Family Medicine

## 2020-01-07 ENCOUNTER — Ambulatory Visit: Payer: Medicare Other | Attending: Family Medicine | Admitting: Family Medicine

## 2020-01-07 VITALS — BP 102/65 | HR 67 | Ht 67.0 in | Wt 160.6 lb

## 2020-01-07 DIAGNOSIS — F17210 Nicotine dependence, cigarettes, uncomplicated: Secondary | ICD-10-CM

## 2020-01-07 DIAGNOSIS — H04203 Unspecified epiphora, bilateral lacrimal glands: Secondary | ICD-10-CM | POA: Diagnosis not present

## 2020-01-07 DIAGNOSIS — J309 Allergic rhinitis, unspecified: Secondary | ICD-10-CM

## 2020-01-07 DIAGNOSIS — I10 Essential (primary) hypertension: Secondary | ICD-10-CM

## 2020-01-07 DIAGNOSIS — Z1159 Encounter for screening for other viral diseases: Secondary | ICD-10-CM

## 2020-01-07 DIAGNOSIS — Z720 Tobacco use: Secondary | ICD-10-CM

## 2020-01-07 MED ORDER — AMLODIPINE BESYLATE 10 MG PO TABS: 10.0000 mg | ORAL_TABLET | Freq: Every day | ORAL | 1 refills | Status: DC

## 2020-01-07 MED ORDER — OLOPATADINE HCL 0.1 % OP SOLN: 1.0000 [drp] | Freq: Two times a day (BID) | OPHTHALMIC | 1 refills | Status: DC

## 2020-01-07 MED ORDER — CETIRIZINE HCL 10 MG PO TABS: 10.0000 mg | ORAL_TABLET | Freq: Every day | ORAL | 1 refills | Status: DC

## 2020-01-07 NOTE — Progress Notes (Signed)
Subjective:  Patient ID: [name] [name], female    DOB: 05/25/1986  Age: 34 y.o. 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 Seen by neurology for management of seizures in 10/2019 and continued on Keppra with recommendation to follow-up in 1 year.  2 weeks ago she is unsure if she fell out of the bed or had a seizure.  Compliant with her antihypertensive. She continues to smoke half a pack of cigarettes and is not ready to quit at this time. Complains of tearing from both eyes with associated itching which she states is all year-round.  Denies presence of blurry vision.  Past Medical History:  Diagnosis Date  . Asthma, allergic    uses inhaler prn - infrequently  . Bilateral thoracic back pain 09/13/2015  . Eczema   . Hypertension   . Lactose intolerance   . Mature cystic teratoma of ovary   . Mood swings 06/01/2015  . Seasonal allergies   . Seizures ([place])   . Stroke [facility]) 04/2018   during aneurysm surgery    Past Surgical History:  Procedure Laterality Date  . CRANIOTOMY N/A 05/10/2018   Procedure: 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;  Surgeon: Consuella Lose, MD;  Location: [place];  Service: Neurosurgery;  Laterality: N/A;  . IR 3D INDEPENDENT WKST  05/10/2018  . IR ANGIO INTRA EXTRACRAN SEL INTERNAL CAROTID BILAT MOD SED  05/10/2018  . IR ANGIO INTRA EXTRACRAN SEL INTERNAL CAROTID BILAT MOD SED  05/30/2019  . IR ANGIO VERTEBRAL SEL VERTEBRAL BILAT MOD SED  05/10/2018  . IR ANGIO VERTEBRAL SEL VERTEBRAL UNI R MOD SED  05/30/2019  . IR US  GUIDE VASC ACCESS RIGHT  05/30/2019  . RADIOLOGY WITH ANESTHESIA N/A 05/10/2018   Procedure: EMBOLIZATION OF ANEURSYM;  Surgeon: Consuella Lose, MD;  Location: [place];  Service: Radiology;  Laterality: N/A;  . TUBAL LIGATION N/A 12/15/2016   Procedure: POST PARTUM TUBAL LIGATION;  Surgeon: Mora Bellman, MD;  Location: [place] [facility];  Service: Gynecology;  Laterality: N/A;  . WISDOM TOOTH EXTRACTION      Family History  Adopted: Yes  Problem Relation Age of Onset  . Hypertension Father   . Hypertension Maternal Aunt   . Hypertension Maternal Uncle   . Hypertension Maternal Grandmother   . Eczema Maternal Grandmother   . Hypertension Maternal Grandfather   . Hypertension Paternal Grandmother   . Hypertension Mother   . Eczema Mother     Allergies  Allergen Reactions  . Sulfa Antibiotics Swelling    Causes swelling on the face.  . Shellfish Allergy Other (See Comments)    Stomach upset    Outpatient Medications Prior to Visit  Medication Sig Dispense Refill  . acetaminophen (TYLENOL) 325 MG tablet Take 1-2 tablets (325-650 mg total) by mouth every 4 (four) hours as needed for mild pain.    . fluticasone (FLONASE) 50 MCG/ACT nasal spray Place 1 spray into both nostrils daily. 16 g 6  . levETIRAcetam (KEPPRA) 1000 MG tablet Take 1 tablet (1,000 mg total) by mouth 2 (two) times daily. 180 tablet 4  . montelukast (SINGULAIR) 10 MG tablet Take 1 tablet (10 mg total) by  mouth at bedtime. 30 tablet 6  . Multiple Vitamin (MULTIVITAMIN WITH MINERALS) TABS tablet Take 1 tablet by mouth daily.    . sodium chloride (OCEAN) 0.65 % SOLN nasal spray Place 1 spray into both nostrils 2 (two) times daily before lunch and supper.  0  . amLODipine (NORVASC) 10 MG tablet Take 1 tablet (10 mg total) by mouth daily. 90 tablet 1  . cetirizine (ZYRTEC) 10 MG tablet Take 1 tablet (10 mg total) by mouth daily. 30 tablet 6   No facility-administered medications prior to visit.     ROS Review of Systems    Constitutional: Negative for activity change, appetite change and fatigue.  HENT: Negative for congestion, sinus pressure and sore throat.   Eyes: Negative for visual disturbance.  Respiratory: Negative for cough, chest tightness, shortness of breath and wheezing.   Cardiovascular: Negative for chest pain and palpitations.  Gastrointestinal: Negative for abdominal distention, abdominal pain and constipation.  Endocrine: Negative for polydipsia.  Genitourinary: Negative for dysuria and frequency.  Musculoskeletal: Negative for arthralgias and back pain.  Skin: Negative for rash.  Neurological: Negative for tremors, light-headedness and numbness.  Hematological: Does not bruise/bleed easily.  Psychiatric/Behavioral: Negative for agitation and behavioral problems.    Objective:  BP 102/65   Pulse 67   Ht 5\' 7"  (1.702 m)   Wt 160 lb 9.6 oz (72.8 kg)   SpO2 100%   BMI 25.15 kg/m   BP/Weight 01/07/2020 11/04/2019 69/48/5462  Systolic BP 703 500 938  Diastolic BP 65 81 83  Wt. (Lbs) 160.6 154 169  BMI 25.15 24.12 26.47      Physical Exam Constitutional:      Appearance: She is well-developed.  Neck:     Vascular: No JVD.  Cardiovascular:     Rate and Rhythm: Normal rate.     Heart sounds: Normal heart sounds. No murmur heard.   Pulmonary:     Effort: Pulmonary effort is normal.     Breath sounds: Normal breath sounds. No wheezing or rales.  Chest:     Chest wall: No tenderness.  Abdominal:     General: Bowel sounds are normal. There is no distension.     Palpations: Abdomen is soft. There is no mass.     Tenderness: There is no abdominal tenderness.  Musculoskeletal:        General: Normal range of motion.     Right lower leg: No edema.     Left lower leg: No edema.  Neurological:     Mental Status: She is alert and oriented to person, place, and time.  Psychiatric:        Mood and Affect: Mood normal.     CMP Latest Ref Rng & Units 05/30/2019 04/04/2019  11/09/2018  Glucose 70 - 99 mg/dL 90 94 98  BUN 6 - 20 mg/dL <5(L) 5(L) 4(L)  Creatinine 0.44 - 1.00 mg/dL 0.97 0.74 0.50  Sodium 135 - 145 mmol/L 142 138 140  Potassium 3.5 - 5.1 mmol/L 3.4(L) 3.8 3.6  Chloride 98 - 111 mmol/L 108 108 110  CO2 22 - 32 mmol/L 24 21(L) -  Calcium 8.9 - 10.3 mg/dL 9.4 8.9 -  Total Protein 6.5 - 8.1 g/dL - - -  Total Bilirubin 0.3 - 1.2 mg/dL - - -  Alkaline Phos 38 - 126 U/L - - -  AST 15 - 41 U/L - - -  ALT 0 - 44 U/L - - -    Lipid Panel  Component Value Date/Time   CHOL 168 08/01/2017 0934   TRIG 133 05/13/2018 2230   HDL 53 08/01/2017 0934   CHOLHDL 3.2 08/01/2017 0934   CHOLHDL 4.4 07/02/2015 0945   VLDL 12 07/02/2015 0945   LDLCALC 105 (H) 08/01/2017 0934    CBC    Component Value Date/Time   WBC 3.8 (L) 05/30/2019 0722   RBC 4.37 05/30/2019 0722   HGB 14.4 05/30/2019 0722   HGB 11.0 (L) 09/25/2016 0902   HCT 42.1 05/30/2019 0722   HCT 33.9 (L) 09/25/2016 0902   PLT 170 05/30/2019 0722   PLT 198 09/25/2016 0902   MCV 96.3 05/30/2019 0722   MCV 98 (H) 09/25/2016 0902   MCH 33.0 05/30/2019 0722   MCHC 34.2 05/30/2019 0722   RDW 12.9 05/30/2019 0722   RDW 12.9 09/25/2016 0902   LYMPHSABS 2.2 05/30/2019 0722   MONOABS 0.2 05/30/2019 0722   EOSABS 0.1 05/30/2019 0722   BASOSABS 0.0 05/30/2019 0722    [name] Results  Component Value Date   HGBA1C 4.3 (L) 05/28/2018    Assessment & Plan:  1. Essential hypertension Controlled - amLODipine (NORVASC) 10 MG tablet; Take 1 tablet (10 mg total) by mouth daily.  Dispense: 90 tablet; Refill: 1 - Basic Metabolic Panel  2. Allergic rhinitis, unspecified seasonality, unspecified trigger Stable - cetirizine (ZYRTEC) 10 MG tablet; Take 1 tablet (10 mg total) by mouth daily.  Dispense: 90 tablet; Refill: 1  3. Tearing eyes Placed on Patanol  4. Need for hepatitis C screening test - HCV RNA quant rflx ultra or genotyp([facility])  5. Tobacco abuse Spent 3 minutes counseling  on smoking cessation and she is not ready to quit at this time    Meds ordered this encounter  Medications  . amLODipine (NORVASC) 10 MG tablet    Sig: Take 1 tablet (10 mg total) by mouth daily.    Dispense:  90 tablet    Refill:  1  . cetirizine (ZYRTEC) 10 MG tablet    Sig: Take 1 tablet (10 mg total) by mouth daily.    Dispense:  90 tablet    Refill:  1  . olopatadine (PATANOL) 0.1 % ophthalmic solution    Sig: Place 1 drop into both eyes 2 (two) times daily.    Dispense:  5 mL    Refill:  1    Follow-up: Return in about 6 months (around 07/09/2020) for Chronic disease management.       Charlott Rakes, MD, FAAFP. [facility] [place], [place]   01/07/2020, 3:28 PM

## 2020-01-07 NOTE — Patient Instructions (Signed)
Steps to Quit Smoking Smoking tobacco is the leading cause of preventable death. It can affect almost every organ in the body. Smoking puts you and people around you at risk for many serious, long-lasting (chronic) diseases. Quitting smoking can be hard, but it is one of the best things that you can do for your health. It is never too late to quit. How do I get ready to quit? When you decide to quit smoking, make a plan to help you succeed. Before you quit:  Pick a date to quit. Set a date within the next 2 weeks to give you time to prepare.  Write down the reasons why you are quitting. Keep this list in places where you will see it often.  Tell your family, friends, and co-workers that you are quitting. Their support is important.  Talk with your doctor about the choices that may help you quit.  Find out if your health insurance will pay for these treatments.  Know the people, places, things, and activities that make you want to smoke (triggers). Avoid them. What first steps can I take to quit smoking?  Throw away all cigarettes at home, at work, and in your car.  Throw away the things that you use when you smoke, such as ashtrays and lighters.  Clean your car. Make sure to empty the ashtray.  Clean your home, including curtains and carpets. What can I do to help me quit smoking? Talk with your doctor about taking medicines and seeing a counselor at the same time. You are more likely to succeed when you do both.  If you are pregnant or breastfeeding, talk with your doctor about counseling or other ways to quit smoking. Do not take medicine to help you quit smoking unless your doctor tells you to do so. To quit smoking: Quit right away  Quit smoking totally, instead of slowly cutting back on how much you smoke over a period of time.  Go to counseling. You are more likely to quit if you go to counseling sessions regularly. Take medicine You may take medicines to help you quit. Some  medicines need a prescription, and some you can buy over-the-counter. Some medicines may contain a drug called nicotine to replace the nicotine in cigarettes. Medicines may:  Help you to stop having the desire to smoke (cravings).  Help to stop the problems that come when you stop smoking (withdrawal symptoms). Your doctor may ask you to use:  Nicotine patches, gum, or lozenges.  Nicotine inhalers or sprays.  Non-nicotine medicine that is taken by mouth. Find resources Find resources and other ways to help you quit smoking and remain smoke-free after you quit. These resources are most helpful when you use them often. They include:  Online chats with a counselor.  Phone quitlines.  Printed self-help materials.  Support groups or group counseling.  Text messaging programs.  Mobile phone apps. Use apps on your mobile phone or tablet that can help you stick to your quit plan. There are many free apps for mobile phones and tablets as well as websites. Examples include Quit Guide from the CDC and smokefree.gov  What things can I do to make it easier to quit?   Talk to your family and friends. Ask them to support and encourage you.  Call a phone quitline (1-800-QUIT-NOW), reach out to support groups, or work with a counselor.  Ask people who smoke to not smoke around you.  Avoid places that make you want to smoke,   such as: ? Bars. ? Parties. ? Smoke-break areas at work.  Spend time with people who do not smoke.  Lower the stress in your life. Stress can make you want to smoke. Try these things to help your stress: ? Getting regular exercise. ? Doing deep-breathing exercises. ? Doing yoga. ? Meditating. ? Doing a body scan. To do this, close your eyes, focus on one area of your body at a time from head to toe. Notice which parts of your body are tense. Try to relax the muscles in those areas. How will I feel when I quit smoking? Day 1 to 3 weeks Within the first 24 hours,  you may start to have some problems that come from quitting tobacco. These problems are very bad 2-3 days after you quit, but they do not often last for more than 2-3 weeks. You may get these symptoms:  Mood swings.  Feeling restless, nervous, angry, or annoyed.  Trouble concentrating.  Dizziness.  Strong desire for high-sugar foods and nicotine.  Weight gain.  Trouble pooping (constipation).  Feeling like you may vomit (nausea).  Coughing or a sore throat.  Changes in how the medicines that you take for other issues work in your body.  Depression.  Trouble sleeping (insomnia). Week 3 and afterward After the first 2-3 weeks of quitting, you may start to notice more positive results, such as:  Better sense of smell and taste.  Less coughing and sore throat.  Slower heart rate.  Lower blood pressure.  Clearer skin.  Better breathing.  Fewer sick days. Quitting smoking can be hard. Do not give up if you fail the first time. Some people need to try a few times before they succeed. Do your best to stick to your quit plan, and talk with your doctor if you have any questions or concerns. Summary  Smoking tobacco is the leading cause of preventable death. Quitting smoking can be hard, but it is one of the best things that you can do for your health.  When you decide to quit smoking, make a plan to help you succeed.  Quit smoking right away, not slowly over a period of time.  When you start quitting, seek help from your doctor, family, or friends. This information is not intended to replace advice given to you by your health care provider. Make sure you discuss any questions you have with your health care provider. Document Revised: 02/28/2019 Document Reviewed: 08/24/2018 Elsevier Patient Education  2020 Elsevier Inc.  

## 2020-01-08 LAB — BASIC METABOLIC PANEL
BUN/Creatinine Ratio: 13 (ref 9–23)
BUN: 8 mg/dL (ref 6–20)
CO2: 23 mmol/L (ref 20–29)
Calcium: 9.7 mg/dL (ref 8.7–10.2)
Chloride: 108 mmol/L — ABNORMAL HIGH (ref 96–106)
Creatinine, Ser: 0.63 mg/dL (ref 0.57–1.00)
GFR calc Af Amer: 135 mL/min/{1.73_m2} (ref >59)
GFR calc non Af Amer: 117 mL/min/{1.73_m2} (ref >59)
Glucose: 81 mg/dL (ref 65–99)
Potassium: 4.3 mmol/L (ref 3.5–5.2)
Sodium: 144 mmol/L (ref 134–144)

## 2020-01-08 LAB — HCV RNA QUANT RFLX ULTRA OR GENOTYP: HCV Quant Baseline: NOT DETECTED IU/mL

## 2020-06-07 ENCOUNTER — Other Ambulatory Visit: Payer: Self-pay

## 2020-06-07 ENCOUNTER — Encounter: Payer: Self-pay | Admitting: Physical Medicine & Rehabilitation

## 2020-06-07 ENCOUNTER — Encounter: Payer: Medicare Other | Attending: Physical Medicine & Rehabilitation | Admitting: Physical Medicine & Rehabilitation

## 2020-06-07 VITALS — BP 134/94 | HR 65 | Temp 98.4°F | Ht 67.0 in | Wt 171.0 lb

## 2020-06-07 DIAGNOSIS — S83281S Other tear of lateral meniscus, current injury, right knee, sequela: Secondary | ICD-10-CM | POA: Diagnosis not present

## 2020-06-07 DIAGNOSIS — R269 Unspecified abnormalities of gait and mobility: Secondary | ICD-10-CM | POA: Diagnosis present

## 2020-06-07 DIAGNOSIS — I6930 Unspecified sequelae of cerebral infarction: Secondary | ICD-10-CM | POA: Insufficient documentation

## 2020-06-07 DIAGNOSIS — S83271D Complex tear of lateral meniscus, current injury, right knee, subsequent encounter: Secondary | ICD-10-CM | POA: Insufficient documentation

## 2020-06-07 DIAGNOSIS — Z720 Tobacco use: Secondary | ICD-10-CM | POA: Insufficient documentation

## 2020-06-07 DIAGNOSIS — R569 Unspecified convulsions: Secondary | ICD-10-CM | POA: Diagnosis not present

## 2020-06-07 DIAGNOSIS — S83281A Other tear of lateral meniscus, current injury, right knee, initial encounter: Secondary | ICD-10-CM | POA: Insufficient documentation

## 2020-06-07 NOTE — Progress Notes (Signed)
Subjective:    Patient ID: [name] [name], female    DOB: 05-Sep-1985, 34 y.o.   MRN: [id number]  HPI Female with history of HTN, eczema, chronic allergic rhinitis, visual deficits presents for follow-for left ACA infarct status post rupture of ACOM aneurysm.  Last clinic visit 06/10/2019.  Since that time, pt states she is doing HEP to a limited extent. Knee pain is controlled, has some shin pain now. Fall over the summer due to a seizure. Pt feels gait is improving, mother does not. She is following up with Neurology, but has not done so after the seizure.   Pain Inventory Average Pain 4 Pain Right Now 2 My pain is intermittent, dull and aching  In the last 24 hours, has pain interfered with the following? General activity 0 Relation with others 0 Enjoyment of life 0 What TIME of day is your pain at its worst? morning Sleep (in general) Fair  Pain is worse with: sitting and inactivity Pain improves with: therapy/exercise Relief from Meds: no pain medication  Mobility walk without assistance how many minutes can you walk? varies ability to climb steps?  yes do you drive?  no Do you have any goals in this area?  yes  Function disabled: date disabled 11/2017  Neuro/Psych trouble walking anxiety  Prior Studies Any changes since last visit?  no  Physicians involved in your care Any changes since last visit?  no   Family History  Adopted: Yes  Problem Relation Age of Onset  . Hypertension Father   . Hypertension Maternal Aunt   . Hypertension Maternal Uncle   . Hypertension Maternal Grandmother   . Eczema Maternal Grandmother   . Hypertension Maternal Grandfather   . Hypertension Paternal Grandmother   . Hypertension Mother   . Eczema Mother    Social History   Socioeconomic History  . Marital status: Single    Spouse name: Not on file  . Number of children: 2  . Years of education: Not on file  . Highest education level: Some college, no degree   Occupational History  . Not on file  Tobacco Use  . Smoking status: Current Every Day Smoker    Packs/day: 0.25    Types: Cigarettes  . Smokeless tobacco: Former Network engineer  . Vaping Use: Never used  Substance and Sexual Activity  . Alcohol use: No  . Drug use: Not Currently    Types: Marijuana    Comment: used to yrs ago  . Sexual activity: Not Currently    Birth control/protection: None  Other Topics Concern  . Not on file  Social History Narrative   Lives with 2 children   Coffee 2 cups daily   Social Determinants of Health   Financial Resource Strain: Not on file  Food Insecurity: Not on file  Transportation Needs: Not on file  Physical Activity: Not on file  Stress: Not on file  Social Connections: Not on file   Past Surgical History:  Procedure Laterality Date  . CRANIOTOMY N/A 05/10/2018   Procedure: 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;  Surgeon: Consuella Lose, MD;  Location: [place];  Service: Neurosurgery;  Laterality: N/A;  . IR 3D INDEPENDENT WKST  05/10/2018  . IR ANGIO INTRA EXTRACRAN SEL INTERNAL CAROTID BILAT MOD SED  05/10/2018  . IR ANGIO INTRA EXTRACRAN SEL INTERNAL CAROTID BILAT MOD SED  05/30/2019  . IR ANGIO VERTEBRAL SEL VERTEBRAL BILAT MOD SED  05/10/2018  . IR ANGIO VERTEBRAL  SEL VERTEBRAL UNI R MOD SED  05/30/2019  . IR US GUIDE VASC ACCESS RIGHT  05/30/2019  . RADIOLOGY WITH ANESTHESIA N/A 05/10/2018   Procedure: EMBOLIZATION OF ANEURSYM;  Surgeon: Consuella Lose, MD;  Location: [place];  Service: Radiology;  Laterality: N/A;  . TUBAL LIGATION N/A 12/15/2016   Procedure: POST PARTUM TUBAL LIGATION;  Surgeon: Mora Bellman, MD;  Location: [place] [facility];  Service: Gynecology;  Laterality: N/A;  . WISDOM TOOTH EXTRACTION     Past Medical History:  Diagnosis Date  . Asthma, allergic    uses inhaler prn - infrequently  . Bilateral thoracic back pain 09/13/2015  . Eczema   . Hypertension   .  Lactose intolerance   . Mature cystic teratoma of ovary   . Mood swings 06/01/2015  . Seasonal allergies   . Seizures ([place])   . Stroke ([place]) 04/2018   during aneurysm surgery   BP (!) 134/94   Pulse 65   Temp 98.4 F (36.9 C)   Ht 5\' 7"  (1.702 m)   Wt 171 lb (77.6 kg)   SpO2 98%   BMI 26.78 kg/m   Opioid Risk Score:   Fall Risk Score:  `1  Depression screen PHQ 2/9  Depression screen [facility] 2/9 01/07/2020 08/07/2018 04/12/2018 10/25/2017 07/25/2017 02/02/2017 12/11/2016  Decreased Interest 0 0 0 0 0 1 1  Down, Depressed, Hopeless 0 0 0 0 0 0 0  PHQ - 2 Score 0 0 0 0 0 1 1  Altered sleeping 0 - 1 1 1  0 2  Tired, decreased energy 0 - 1 2 1  0 1  Change in appetite 0 - 0 0 0 0 0  Feeling bad or failure about yourself  0 - 0 0 0 0 0  Trouble concentrating 0 - 0 1 0 1 0  Moving slowly or fidgety/restless 0 - 0 0 0 0 0  Suicidal thoughts 0 - 0 0 0 0 0  PHQ-9 Score 0 - 2 4 2 2 4   Some recent data might be hidden   Review of Systems  HENT: Negative.   Eyes: Negative.   Respiratory: Negative.   Cardiovascular: Negative.   Gastrointestinal: Negative.   Endocrine: Negative.   Genitourinary: Negative.   Musculoskeletal: Positive for arthralgias and gait problem.  Skin: Positive for rash.  Allergic/Immunologic: Positive for environmental allergies.  Neurological: Positive for seizures.  Hematological: Negative.   Psychiatric/Behavioral: The patient is nervous/anxious.   All other systems reviewed and are negative.      Objective:   Physical Exam  Constitutional: No distress . Vital signs reviewed. HENT: Normocephalic.  Atraumatic. Eyes: EOMI. No discharge. Cardiovascular: No JVD.   Respiratory: Normal effort.  No stridor.   GI: Non-distended.   Skin: Warm and dry.  Intact. Psych: Normal mood.  Normal behavior. Musc: No edema in extremities.  No tenderness in extremities. Gait: Improved, but unable to toe/heel ambulate RUE: 4+/5 proximal to distal  RLE 4+/5 HF, KE, 4--4/5  ADF    Assessment & Plan:  Female with history of HTN, eczema, chronic allergic rhinitis, visual deficits presents for follow-up for left ACA infarct status post rupture of ACOM aneurysm.  1.  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             Continue HEP, encouraged consistency  2. Pain Management, mainly right knee due to multiple injuries  MRI 05/2018: 1. Tiny Baker cyst, likely corresponding to the cystic structure  seen in the popliteal fossa on recent right lower extremity ultrasound. 2. Complex tear of the lateral meniscus anterior root and horn extending to the body junction with borderline extrusion of the body. 3. Mild edema tracks adjacent to the MCL. This can be incidental but in the appropriate clinical circumstance could represent grade 1 sprain. 4. Mild medial and lateral compartment osteoarthritis.:   Baker's cyst, lateral meniscal tear, and OA.    May consider brace for comfort  Stable at present  3. Gait abnormality  Mother/pt to consider surgical regerral if causing falls or interfering with ambulation/stairs/etc  Improving  Will refer for PT  4. Seizures   Cont meds  Did not see Neurology after recent seizure, encouraged follow up  5. Tobacco abuse  5-6 cig/day  Encouraged abstinence

## 2020-07-07 ENCOUNTER — Other Ambulatory Visit (HOSPITAL_COMMUNITY): Payer: Self-pay | Admitting: Physician Assistant

## 2020-07-07 DIAGNOSIS — I609 Nontraumatic subarachnoid hemorrhage, unspecified: Secondary | ICD-10-CM

## 2020-07-08 ENCOUNTER — Telehealth: Payer: Self-pay | Admitting: Family Medicine

## 2020-07-08 ENCOUNTER — Other Ambulatory Visit: Payer: Self-pay | Admitting: Family Medicine

## 2020-07-08 DIAGNOSIS — J309 Allergic rhinitis, unspecified: Secondary | ICD-10-CM

## 2020-07-08 MED ORDER — MONTELUKAST SODIUM 10 MG PO TABS: 10.0000 mg | ORAL_TABLET | Freq: Every day | ORAL | 0 refills | Status: DC

## 2020-07-08 NOTE — Telephone Encounter (Signed)
Copied from [place] [id number]. Topic: General - Inquiry >> Jul 07, 2020 12:54 PM Gillis Ends D wrote: Reason for CRM: Patient called because her Keppra prescription is now 400.00 and she is not sure why. She would need for someone to give her a call back as soon as possible. She can be reached at [phone]. Please advise

## 2020-07-08 NOTE — Telephone Encounter (Signed)
Medication: montelukast (SINGULAIR) 10 MG tablet  Has the pt contacted their pharmacy? No she said she decided to call her dr instead.  Preferred pharmacy: [facility] [place] [address]  Please be advised refills may take up to 3 business days.  We ask that you follow up with your pharmacy.

## 2020-07-08 NOTE — Telephone Encounter (Signed)
Needs future appt in 1 month

## 2020-07-08 NOTE — Telephone Encounter (Signed)
Call placed to patient's pharmacy. They did not have her correct insurance information on file. I have corrected this and the pharmacy informed me that her Keppra has a $0 copay. I called the patient and informed her. She will go pick that up tonight.

## 2020-07-08 NOTE — Telephone Encounter (Signed)
Can we look into why this patient medication price has changed. Medication was sent to [facility].

## 2020-07-08 NOTE — Telephone Encounter (Signed)
Pt following up on her message from yesterday. Pt says her med is $400. Pt is completely out.  She says it is usually $12.   Pt needs this med so she does not have seizures.

## 2020-07-21 ENCOUNTER — Ambulatory Visit (HOSPITAL_COMMUNITY)
Admission: RE | Admit: 2020-07-21 | Discharge: 2020-07-21 | Disposition: A | Discharge status: Home / Self Care | Payer: Medicare Other | Source: Ambulatory Visit | Attending: Physician Assistant | Admitting: Physician Assistant

## 2020-07-21 ENCOUNTER — Other Ambulatory Visit: Payer: Self-pay

## 2020-07-21 DIAGNOSIS — I609 Nontraumatic subarachnoid hemorrhage, unspecified: Secondary | ICD-10-CM | POA: Diagnosis present

## 2020-07-21 MED ORDER — GADOBUTROL 1 MMOL/ML IV SOLN
7.0000 mL | Freq: Once | INTRAVENOUS | Status: AC | PRN
  Administered 2020-07-21: 7 mL via INTRAVENOUS

## 2020-08-07 ENCOUNTER — Other Ambulatory Visit: Payer: Self-pay | Admitting: Family Medicine

## 2020-08-07 DIAGNOSIS — I10 Essential (primary) hypertension: Secondary | ICD-10-CM

## 2020-08-07 DIAGNOSIS — J309 Allergic rhinitis, unspecified: Secondary | ICD-10-CM

## 2020-08-07 NOTE — Telephone Encounter (Signed)
Courtesy refill given, appointment needed. 

## 2020-08-11 ENCOUNTER — Telehealth: Payer: Self-pay | Admitting: Family Medicine

## 2020-08-11 NOTE — Telephone Encounter (Signed)
Copied from [place] [id number]. Topic: General - Inquiry >> Aug 10, 2020 12:02 PM Greggory Keen D wrote: Reason for CRM: Pt called asking if dr. Margarita Rana could write a note to excuse her from [employer] due to her health problems.  She said she can fax it to [phone].  I f there is a question please call patient [phone]

## 2020-08-11 NOTE — Telephone Encounter (Signed)
Will route to PCP.  Pt needing letter to be excused from jury duty

## 2020-08-12 NOTE — Telephone Encounter (Signed)
Pt called back and given Dr Margarita Rana message. Pt states this is concerning and does not think in here best interest to go to jury duty.  Pt states she is disabled, can't hardly see, she limps when she walks.. Pt states 2 years ago she aneurism ,while  they were repairing it she had a stroke and her right side is effected. Pt hoping Dr Margarita Rana will change her mind about this with her current health issues.

## 2020-08-12 NOTE — Telephone Encounter (Signed)
There are no medical indications that will justify her request based on her current medical status.

## 2020-08-12 NOTE — Telephone Encounter (Signed)
Pt was called and a VM was left informing her to return phone call regarding jury letter.

## 2020-09-06 ENCOUNTER — Encounter: Payer: Medicare Other | Attending: Physical Medicine & Rehabilitation | Admitting: Physical Medicine & Rehabilitation

## 2020-09-06 ENCOUNTER — Encounter: Payer: Self-pay | Admitting: Physical Medicine & Rehabilitation

## 2020-09-06 ENCOUNTER — Other Ambulatory Visit: Payer: Self-pay

## 2020-09-06 VITALS — BP 118/82 | HR 75 | Temp 99.1°F | Ht 67.0 in | Wt 166.6 lb

## 2020-09-06 DIAGNOSIS — R569 Unspecified convulsions: Secondary | ICD-10-CM | POA: Insufficient documentation

## 2020-09-06 DIAGNOSIS — I6930 Unspecified sequelae of cerebral infarction: Secondary | ICD-10-CM | POA: Insufficient documentation

## 2020-09-06 DIAGNOSIS — Z720 Tobacco use: Secondary | ICD-10-CM | POA: Insufficient documentation

## 2020-09-06 NOTE — Progress Notes (Signed)
Subjective:    Patient ID: [name] [name], female    DOB: 22-Jul-1985, 35 y.o.   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  Last clinic visit 06/07/2020.  Since that time, pt states she did not hear from therapies. Had a fall over the weekend when she slipped on sock and fell on her knee. She did not follow up with Neurology. Smoking ~5 cig/day.   Pain Inventory Average Pain 1 Pain Right Now 6 My pain is intermittent and aching  In the last 24 hours, has pain interfered with the following? General activity 6 Relation with others 6 Enjoyment of life 8 What TIME of day is your pain at its worst? night Sleep (in general) Good  Pain is worse with: walking and standing Pain improves with: rest Relief from Meds: 7   Family History  Adopted: Yes  Problem Relation Age of Onset   Hypertension Father    Hypertension Maternal Aunt    Hypertension Maternal Uncle    Hypertension Maternal Grandmother    Eczema Maternal Grandmother    Hypertension Maternal Grandfather    Hypertension Paternal Grandmother    Hypertension Mother    Eczema Mother    Social History   Socioeconomic History   Marital status: Single    Spouse name: Not on file   Number of children: 2   Years of education: Not on file   Highest education level: Some college, no degree  Occupational History   Not on file  Tobacco Use   Smoking status: Current Every Day Smoker    Packs/day: 0.25    Types: Cigarettes   Smokeless tobacco: Former Counsellor Use: Never used  Substance and Sexual Activity   Alcohol use: No   Drug use: Not Currently    Types: Marijuana    Comment: used to yrs ago   Sexual activity: Not Currently    Birth control/protection: None  Other Topics Concern   Not on file  Social History Narrative   Lives with 2  children   Coffee 2 cups daily   Social Determinants of Health   Financial Resource Strain: Not on file  Food Insecurity: Not on file  Transportation Needs: Not on file  Physical Activity: Not on file  Stress: Not on file  Social Connections: Not on file   Past Surgical History:  Procedure Laterality Date   CRANIOTOMY N/A 05/10/2018   Procedure: 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;  Surgeon: Consuella Lose, MD;  Location: [facility];  Service: Neurosurgery;  Laterality: N/A;   IR 3D INDEPENDENT WKST  05/10/2018   IR ANGIO INTRA EXTRACRAN SEL INTERNAL CAROTID BILAT MOD SED  05/10/2018   IR ANGIO INTRA EXTRACRAN SEL INTERNAL CAROTID BILAT MOD SED  05/30/2019   IR ANGIO VERTEBRAL SEL VERTEBRAL BILAT MOD SED  05/10/2018   IR ANGIO VERTEBRAL SEL VERTEBRAL UNI R MOD SED  05/30/2019   IR US GUIDE VASC ACCESS RIGHT  05/30/2019   RADIOLOGY WITH ANESTHESIA N/A 05/10/2018   Procedure: EMBOLIZATION OF ANEURSYM;  Surgeon: Consuella Lose, MD;  Location: [facility];  Service: Radiology;  Laterality: N/A;   TUBAL LIGATION N/A 12/15/2016   Procedure: POST PARTUM TUBAL LIGATION;  Surgeon: Mora Bellman, MD;  Location: [facility];  Service: Gynecology;  Laterality: N/A;   [place] EXTRACTION     Past Medical  History:  Diagnosis Date   Asthma, allergic    uses inhaler prn - infrequently   Bilateral thoracic back pain 09/13/2015   Eczema    Hypertension    Lactose intolerance    Mature cystic teratoma of ovary    Mood swings 06/01/2015   Seasonal allergies    Seizures ([place])    Stroke ([employer]) 04/2018   during aneurysm surgery   BP 118/82    Pulse 75    Temp 99.1 F (37.3 C)    Ht 5\' 7"  (1.702 m)    Wt 166 lb 9.6 oz (75.6 kg)    SpO2 97%    BMI 26.09 kg/m   Opioid Risk Score:   Fall Risk Score:  `1  Depression screen PHQ 2/9  Depression screen [facility] 2/9 09/06/2020 01/07/2020 08/07/2018 04/12/2018 10/25/2017 07/25/2017 02/02/2017  Decreased  Interest 0 0 0 0 0 0 1  Down, Depressed, Hopeless 0 0 0 0 0 0 0  PHQ - 2 Score 0 0 0 0 0 0 1  Altered sleeping - 0 - 1 1 1  0  Tired, decreased energy - 0 - 1 2 1  0  Change in appetite - 0 - 0 0 0 0  Feeling bad or failure about yourself  - 0 - 0 0 0 0  Trouble concentrating - 0 - 0 1 0 1  Moving slowly or fidgety/restless - 0 - 0 0 0 0  Suicidal thoughts - 0 - 0 0 0 0  PHQ-9 Score - 0 - 2 4 2 2   Some recent data might be hidden   Review of Systems  HENT: Negative.   Eyes: Negative.   Respiratory: Negative.   Cardiovascular: Negative.   Gastrointestinal: Negative.   Endocrine: Negative.   Genitourinary: Negative.   Musculoskeletal: Positive for arthralgias and gait problem.       Left leg pain, left knee pain On & off pain in right knee  Skin: Positive for rash.  Allergic/Immunologic: Positive for environmental allergies.  Neurological: Positive for seizures.  Hematological: Negative.   Psychiatric/Behavioral: The patient is nervous/anxious.   All other systems reviewed and are negative.      Objective:   Physical Exam  Constitutional: No distress . Vital signs reviewed. HENT: Normocephalic.  Atraumatic. Eyes: EOMI. No discharge. Cardiovascular: No JVD.   Respiratory: Normal effort.  No stridor.   GI: Non-distended.   Skin: Warm and dry.  Intact. Psych: Normal mood.  Normal behavior. Musc: No edema in extremities.  No tenderness in extremities. 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  1.  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             Continue HEP, encouraged consistency  2. Pain Management, mainly right knee due to multiple injuries  MRI 05/2018: 1. Tiny Baker cyst, likely corresponding to  the cystic structure seen in the popliteal fossa on recent right lower extremity ultrasound. 2. Complex tear of the lateral meniscus anterior root and horn extending to the body junction with borderline extrusion of the body. 3. Mild edema tracks adjacent to the MCL. This can be incidental but in the appropriate clinical circumstance could represent grade 1 sprain. 4. Mild medial and lateral compartment osteoarthritis.:  Baker's cyst, lateral meniscal tear, and OA.    May consider brace for comfort  Stable at present  3. Gait abnormality  Mother/pt to consider surgical regerral if causing falls or interfering with ambulation/stairs/etc  Improving  Will refer for PT, no contact made, will follow up  4. Seizures   Cont meds  Did not see Neurology after recent seizure, encouraged follow up, reminded   5. Tobacco abuse  ~5 cig/day  Encouraged abstinence again

## 2020-11-02 ENCOUNTER — Telehealth: Payer: Self-pay | Admitting: Family Medicine

## 2020-11-02 NOTE — Telephone Encounter (Signed)
LVM for patient to call back. Needs rescheduled from 11/04/20, Amy out. There is a spot held for her 11/03/20 at 9:30AM. Asked patient to call back to take spot or reschedule to a later date.

## 2020-11-04 ENCOUNTER — Ambulatory Visit (INDEPENDENT_AMBULATORY_CARE_PROVIDER_SITE_OTHER): Payer: Medicaid Other | Admitting: Family Medicine

## 2020-11-09 ENCOUNTER — Encounter: Payer: Medicare Other | Attending: Physical Medicine & Rehabilitation | Admitting: Physical Medicine & Rehabilitation

## 2020-11-09 DIAGNOSIS — R569 Unspecified convulsions: Secondary | ICD-10-CM | POA: Insufficient documentation

## 2020-11-09 DIAGNOSIS — Z720 Tobacco use: Secondary | ICD-10-CM | POA: Insufficient documentation

## 2020-11-09 DIAGNOSIS — I6930 Unspecified sequelae of cerebral infarction: Secondary | ICD-10-CM | POA: Insufficient documentation

## 2020-11-18 ENCOUNTER — Ambulatory Visit: Payer: Self-pay | Admitting: Family Medicine

## 2020-12-29 ENCOUNTER — Ambulatory Visit: Payer: Medicare Other | Admitting: Family Medicine

## 2021-01-03 ENCOUNTER — Other Ambulatory Visit: Payer: Self-pay

## 2021-01-03 ENCOUNTER — Encounter: Payer: Self-pay | Admitting: Family Medicine

## 2021-01-03 ENCOUNTER — Ambulatory Visit: Payer: Medicare Other | Attending: Family Medicine | Admitting: Family Medicine

## 2021-01-03 VITALS — BP 116/73 | HR 66 | Ht 67.0 in | Wt 162.2 lb

## 2021-01-03 DIAGNOSIS — Z09 Encounter for follow-up examination after completed treatment for conditions other than malignant neoplasm: Secondary | ICD-10-CM | POA: Insufficient documentation

## 2021-01-03 DIAGNOSIS — R4701 Aphasia: Secondary | ICD-10-CM | POA: Insufficient documentation

## 2021-01-03 DIAGNOSIS — Z86718 Personal history of other venous thrombosis and embolism: Secondary | ICD-10-CM | POA: Diagnosis not present

## 2021-01-03 DIAGNOSIS — I10 Essential (primary) hypertension: Secondary | ICD-10-CM | POA: Insufficient documentation

## 2021-01-03 DIAGNOSIS — Z7901 Long term (current) use of anticoagulants: Secondary | ICD-10-CM | POA: Diagnosis not present

## 2021-01-03 DIAGNOSIS — Z79899 Other long term (current) drug therapy: Secondary | ICD-10-CM | POA: Insufficient documentation

## 2021-01-03 DIAGNOSIS — Z1159 Encounter for screening for other viral diseases: Secondary | ICD-10-CM | POA: Insufficient documentation

## 2021-01-03 DIAGNOSIS — Z8673 Personal history of transient ischemic attack (TIA), and cerebral infarction without residual deficits: Secondary | ICD-10-CM | POA: Diagnosis not present

## 2021-01-03 DIAGNOSIS — R569 Unspecified convulsions: Secondary | ICD-10-CM | POA: Insufficient documentation

## 2021-01-03 DIAGNOSIS — Z8249 Family history of ischemic heart disease and other diseases of the circulatory system: Secondary | ICD-10-CM | POA: Insufficient documentation

## 2021-01-03 DIAGNOSIS — J309 Allergic rhinitis, unspecified: Secondary | ICD-10-CM | POA: Insufficient documentation

## 2021-01-03 MED ORDER — MONTELUKAST SODIUM 10 MG PO TABS: 10.0000 mg | ORAL_TABLET | Freq: Every day | ORAL | 1 refills | Status: DC

## 2021-01-03 MED ORDER — AMLODIPINE BESYLATE 10 MG PO TABS: 10.0000 mg | ORAL_TABLET | Freq: Every day | ORAL | 1 refills | Status: DC

## 2021-01-03 MED ORDER — FLUTICASONE PROPIONATE 50 MCG/ACT NA SUSP: 1.0000 | Freq: Every day | NASAL | 6 refills | Status: DC

## 2021-01-03 NOTE — Patient Instructions (Signed)
Steps to Quit Smoking Smoking tobacco is the leading cause of preventable death. It can affect almost every organ in the body. Smoking puts you and people around you at risk for many serious, long-lasting (chronic) diseases. Quitting smoking can be hard, but it is one of the best things that you can do for your health. It is never too late to quit. How do I get ready to quit? When you decide to quit smoking, make a plan to help you succeed. Before you quit: Pick a date to quit. Set a date within the next 2 weeks to give you time to prepare. Write down the reasons why you are quitting. Keep this list in places where you will see it often. Tell your family, friends, and co-workers that you are quitting. Their support is important. Talk with your doctor about the choices that may help you quit. Find out if your health insurance will pay for these treatments. Know the people, places, things, and activities that make you want to smoke (triggers). Avoid them. What first steps can I take to quit smoking? Throw away all cigarettes at home, at work, and in your car. Throw away the things that you use when you smoke, such as ashtrays and lighters. Clean your car. Make sure to empty the ashtray. Clean your home, including curtains and carpets. What can I do to help me quit smoking? Talk with your doctor about taking medicines and seeing a counselor at the same time. You are more likely to succeed when you do both. If you are pregnant or breastfeeding, talk with your doctor about counseling or other ways to quit smoking. Do not take medicine to help you quit smoking unless your doctor tells you to do so. To quit smoking: Quit right away Quit smoking totally, instead of slowly cutting back on how much you smoke over a period of time. Go to counseling. You are more likely to quit if you go to counseling sessions regularly. Take medicine You may take medicines to help you quit. Some medicines need a  prescription, and some you can buy over-the-counter. Some medicines may contain a drug called nicotine to replace the nicotine in cigarettes. Medicines may: Help you to stop having the desire to smoke (cravings). Help to stop the problems that come when you stop smoking (withdrawal symptoms). Your doctor may ask you to use: Nicotine patches, gum, or lozenges. Nicotine inhalers or sprays. Non-nicotine medicine that is taken by mouth. Find resources Find resources and other ways to help you quit smoking and remain smoke-free after you quit. These resources are most helpful when you use them often. They include: Online chats with a counselor. Phone quitlines. Printed self-help materials. Support groups or group counseling. Text messaging programs. Mobile phone apps. Use apps on your mobile phone or tablet that can help you stick to your quit plan. There are many free apps for mobile phones and tablets as well as websites. Examples include Quit Guide from the CDC and smokefree.gov  What things can I do to make it easier to quit?  Talk to your family and friends. Ask them to support and encourage you. Call a phone quitline (1-800-QUIT-NOW), reach out to support groups, or work with a counselor. Ask people who smoke to not smoke around you. Avoid places that make you want to smoke, such as: Bars. Parties. Smoke-break areas at work. Spend time with people who do not smoke. Lower the stress in your life. Stress can make you want to   smoke. Try these things to help your stress: Getting regular exercise. Doing deep-breathing exercises. Doing yoga. Meditating. Doing a body scan. To do this, close your eyes, focus on one area of your body at a time from head to toe. Notice which parts of your body are tense. Try to relax the muscles in those areas. How will I feel when I quit smoking? Day 1 to 3 weeks Within the first 24 hours, you may start to have some problems that come from quitting tobacco.  These problems are very bad 2-3 days after you quit, but they do not often last for more than 2-3 weeks. You may get these symptoms: Mood swings. Feeling restless, nervous, angry, or annoyed. Trouble concentrating. Dizziness. Strong desire for high-sugar foods and nicotine. Weight gain. Trouble pooping (constipation). Feeling like you may vomit (nausea). Coughing or a sore throat. Changes in how the medicines that you take for other issues work in your body. Depression. Trouble sleeping (insomnia). Week 3 and afterward After the first 2-3 weeks of quitting, you may start to notice more positive results, such as: Better sense of smell and taste. Less coughing and sore throat. Slower heart rate. Lower blood pressure. Clearer skin. Better breathing. Fewer sick days. Quitting smoking can be hard. Do not give up if you fail the first time. Some people need to try a few times before they succeed. Do your best to stick to your quit plan, and talk with your doctor if you have any questions or concerns. Summary Smoking tobacco is the leading cause of preventable death. Quitting smoking can be hard, but it is one of the best things that you can do for your health. When you decide to quit smoking, make a plan to help you succeed. Quit smoking right away, not slowly over a period of time. When you start quitting, seek help from your doctor, family, or friends. This information is not intended to replace advice given to you by your health care provider. Make sure you discuss any questions you have with your health care provider. Document Revised: 02/28/2019 Document Reviewed: 08/24/2018 Elsevier Patient Education  2022 Elsevier Inc.  

## 2021-01-03 NOTE — Progress Notes (Signed)
Subjective:  Patient ID: [name] [name], female    DOB: [birth year]  Age: 35 y.o. 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  Interval History: She has not had any recent seizures; closely followed by Neurology. Smokes 5 cig/day. Declines Pharmacotherapy to aid smoking cessation. Compliant with her antihypertensive. Her major form of exercise is by running around with her 2 kids who are 49 and 33 years old. Allergic rhinitis is controlled on Flonase and Singulair. She has no additional concerns today.  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;  Surgeon: Consuella Lose, MD;  Location: [facility];  Service: Neurosurgery;  Laterality: N/A;   IR 3D INDEPENDENT WKST  05/10/2018   IR ANGIO INTRA EXTRACRAN SEL INTERNAL CAROTID BILAT MOD SED  05/10/2018   IR ANGIO INTRA EXTRACRAN SEL INTERNAL CAROTID BILAT MOD SED  05/30/2019   IR ANGIO VERTEBRAL SEL VERTEBRAL BILAT MOD SED  05/10/2018   IR ANGIO VERTEBRAL SEL VERTEBRAL UNI R MOD SED  05/30/2019   IR US GUIDE VASC ACCESS RIGHT  05/30/2019   RADIOLOGY WITH ANESTHESIA N/A 05/10/2018    Procedure: EMBOLIZATION OF ANEURSYM;  Surgeon: Consuella Lose, MD;  Location: [facility];  Service: Radiology;  Laterality: N/A;   TUBAL LIGATION N/A 12/15/2016   Procedure: POST PARTUM TUBAL LIGATION;  Surgeon: Mora Bellman, MD;  Location: [facility];  Service: Gynecology;  Laterality: N/A;   WISDOM TOOTH EXTRACTION      Family History  Adopted: Yes  Problem Relation Age of Onset   Hypertension Father    Hypertension Maternal Aunt    Hypertension Maternal Uncle    Hypertension Maternal Grandmother    Eczema Maternal Grandmother    Hypertension Maternal Grandfather    Hypertension Paternal Grandmother    Hypertension Mother    Eczema Mother     Allergies  Allergen Reactions   Sulfa Antibiotics Swelling    Causes swelling on the face.   Shellfish Allergy Other (See Comments)    Stomach upset    Outpatient Medications Prior to Visit  Medication Sig Dispense Refill   acetaminophen (TYLENOL) 325 MG tablet Take 1-2 tablets (325-650 mg total) by mouth every 4 (four) hours as needed for mild pain.     EQ ALLERGY RELIEF, CETIRIZINE, 10 MG tablet Take 1 tablet by mouth once daily 90 tablet 1   levETIRAcetam (KEPPRA) 1000 MG tablet Take 1 tablet (1,000 mg total) by mouth 2 (two) times daily. 180 tablet 4   Multiple Vitamin (MULTIVITAMIN WITH MINERALS) TABS tablet Take 1 tablet by mouth daily.     sodium chloride (OCEAN) 0.65 % SOLN nasal spray Place 1 spray into both nostrils  2 (two) times daily before lunch and supper.  0   amLODipine (NORVASC) 10 MG tablet Take 1 tablet (10 mg total) by mouth daily. OFFICE VISIT NEEDED FOR ADDITIONAL REFILLS 90 tablet 0   fluticasone (FLONASE) 50 MCG/ACT nasal spray Place 1 spray into both nostrils daily. 16 g 6   montelukast (SINGULAIR) 10 MG tablet TAKE 1 TABLET BY MOUTH AT BEDTIME 90 tablet 1   No facility-administered medications prior to visit.     ROS Review of Systems  Constitutional:  Negative for activity change, appetite change and  fatigue.  HENT:  Negative for congestion, sinus pressure and sore throat.   Eyes:  Negative for visual disturbance.  Respiratory:  Negative for cough, chest tightness, shortness of breath and wheezing.   Cardiovascular:  Negative for chest pain and palpitations.  Gastrointestinal:  Negative for abdominal distention, abdominal pain and constipation.  Endocrine: Negative for polydipsia.  Genitourinary:  Negative for dysuria and frequency.  Musculoskeletal:  Negative for arthralgias and back pain.  Skin:  Negative for rash.  Neurological:  Negative for tremors, light-headedness and numbness.  Hematological:  Does not bruise/bleed easily.  Psychiatric/Behavioral:  Negative for agitation and behavioral problems.    Objective:  BP 116/73   Pulse 66   Ht '5\' 7"'  (1.702 m)   Wt 162 lb 3.2 oz (73.6 kg)   SpO2 100%   BMI 25.40 kg/m   BP/Weight 01/03/2021 09/06/2020 66/11/43  Systolic BP 997 741 423  Diastolic BP 73 82 94  Wt. (Lbs) 162.2 166.6 171  BMI 25.4 26.09 26.78      Physical Exam Constitutional:      General: She is not in acute distress.    Appearance: She is well-developed. She is not diaphoretic.  HENT:     Head: Normocephalic.     Right Ear: External ear normal.     Left Ear: External ear normal.     Nose: Nose normal.     Mouth/Throat:     Mouth: Mucous membranes are moist.  Eyes:     Extraocular Movements: Extraocular movements intact.     Conjunctiva/sclera: Conjunctivae normal.     Pupils: Pupils are equal, round, and reactive to light.  Neck:     Vascular: No JVD.  Cardiovascular:     Rate and Rhythm: Normal rate and regular rhythm.     Pulses: Normal pulses.     Heart sounds: Normal heart sounds. No murmur heard.   No gallop.  Pulmonary:     Effort: Pulmonary effort is normal. No respiratory distress.     Breath sounds: Normal breath sounds. No wheezing or rales.  Chest:     Chest wall: No tenderness.  Abdominal:     General: Bowel sounds are normal.  There is no distension.     Palpations: Abdomen is soft. There is no mass.     Tenderness: There is no abdominal tenderness.  Musculoskeletal:        General: No tenderness. Normal range of motion.     Cervical back: Normal range of motion.  Skin:    General: Skin is warm and dry.  Neurological:     Mental Status: She is alert and oriented to person, place, and time.     Deep Tendon Reflexes: Reflexes are normal and symmetric.  Psychiatric:        Mood and Affect: Mood normal.    CMP Latest Ref Rng & Units 01/07/2020 05/30/2019 04/04/2019  Glucose 65 - 99 mg/dL 81 90  94  BUN 6 - 20 mg/dL 8 <5(L) 5(L)  Creatinine 0.57 - 1.00 mg/dL 0.63 0.97 0.74  Sodium 134 - 144 mmol/L 144 142 138  Potassium 3.5 - 5.2 mmol/L 4.3 3.4(L) 3.8  Chloride 96 - 106 mmol/L 108(H) 108 108  CO2 20 - 29 mmol/L 23 24 21(L)  Calcium 8.7 - 10.2 mg/dL 9.7 9.4 8.9  Total Protein 6.5 - 8.1 g/dL - - -  Total Bilirubin 0.3 - 1.2 mg/dL - - -  Alkaline Phos 38 - 126 U/L - - -  AST 15 - 41 U/L - - -  ALT 0 - 44 U/L - - -    Lipid Panel     Component Value Date/Time   CHOL 168 08/01/2017 0934   TRIG 133 05/13/2018 2230   HDL 53 08/01/2017 0934   CHOLHDL 3.2 08/01/2017 0934   CHOLHDL 4.4 07/02/2015 0945   VLDL 12 07/02/2015 0945   LDLCALC 105 (H) 08/01/2017 0934    CBC    Component Value Date/Time   WBC 3.8 (L) 05/30/2019 0722   RBC 4.37 05/30/2019 0722   HGB 14.4 05/30/2019 0722   HGB 11.0 (L) 09/25/2016 0902   HCT 42.1 05/30/2019 0722   HCT 33.9 (L) 09/25/2016 0902   PLT 170 05/30/2019 0722   PLT 198 09/25/2016 0902   MCV 96.3 05/30/2019 0722   MCV 98 (H) 09/25/2016 0902   MCH 33.0 05/30/2019 0722   MCHC 34.2 05/30/2019 0722   RDW 12.9 05/30/2019 0722   RDW 12.9 09/25/2016 0902   LYMPHSABS 2.2 05/30/2019 0722   MONOABS 0.2 05/30/2019 0722   EOSABS 0.1 05/30/2019 0722   BASOSABS 0.0 05/30/2019 0722    [name] Results  Component Value Date   HGBA1C 4.3 (L) 05/28/2018    Assessment & Plan:   1. Essential hypertension Controlled Counseled on blood pressure goal of less than 130/80, low-sodium, DASH diet, medication compliance, 150 minutes of moderate intensity exercise per week. Discussed medication compliance, adverse effects. - amLODipine (NORVASC) 10 MG tablet; Take 1 tablet (10 mg total) by mouth daily.  Dispense: 90 tablet; Refill: 1 - CMP14+EGFR; Future - Lipid panel; Future  2. Allergic rhinitis, unspecified seasonality, unspecified trigger Stable - montelukast (SINGULAIR) 10 MG tablet; Take 1 tablet (10 mg total) by mouth at bedtime.  Dispense: 90 tablet; Refill: 1 - fluticasone (FLONASE) 50 MCG/ACT nasal spray; Place 1 spray into both nostrils daily.  Dispense: 16 g; Refill: 6  3. Need for hepatitis C screening test - HCV RNA quant rflx ultra or genotyp([facility]); Future  4. Seizure ([place]) Controlled on Keppra Followed by neurology    Meds ordered this encounter  Medications   amLODipine (NORVASC) 10 MG tablet    Sig: Take 1 tablet (10 mg total) by mouth daily.    Dispense:  90 tablet    Refill:  1   montelukast (SINGULAIR) 10 MG tablet    Sig: Take 1 tablet (10 mg total) by mouth at bedtime.    Dispense:  90 tablet    Refill:  1   fluticasone (FLONASE) 50 MCG/ACT nasal spray    Sig: Place 1 spray into both nostrils daily.    Dispense:  16 g    Refill:  6    Follow-up: Return in about 6 months (around 07/06/2021) for Follow-up of medical conditions.       Charlott Rakes, MD, FAAFP. [facility] [place], [place]   01/03/2021, 2:46 PM

## 2021-01-03 NOTE — Progress Notes (Signed)
Needs medication refills.

## 2021-02-17 NOTE — Progress Notes (Deleted)
PATIENT: [name] [name] DOB: [birth year]  REASON FOR VISIT: follow up HISTORY FROM: patient  No chief complaint on file.    HISTORY OF PRESENT ILLNESS: 02/17/21 ALL: [name] [name] returns for follow up for seizures. She continues levetiracetam '1000mg'$  BID.   11/04/2019 ALL:  [name] [name] is a 35 y.o. female here today for follow up for seizures. She continues levetiracetam '1000mg'$  twice daily. She called with concerns of seizure activity in her sleep in 09/2019 after missing evening dose of levetiracetam. She does admit to occasional missed doses but does not feel this occurs very often. She is feeling well today. She is tolerating medication well.   She continues close follow up with PCP for stroke prevention. She denies any concerns of stroke like symptoms. BP is normal.   HISTORY: (copied from my note on 05/07/2019)  [name] [name] is a 35 y.o. 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 In 10/2018, patient suffered a seizure in her sleep. She was started on levetiracetam '500mg'$  twice daily. She has tolerated medication without obvious adverse effects. Unfortunately, she had a breakthrough seizure on 10/16. ER workup concerning for UTI. Dr Leta Baptist increased levetiracetam to '1000mg'$  twice daily. She is tolerating dose increase well and denies seizure activity. She takes medication daily at 10am and 10pm. No missed doses. She is followed closely by PCP. Blood pressures have been normal. No stroke like symptoms. She has residual right sided weakness that is improving with time. She does not drive. She has two small children. She lives with her significant other. She has a great support system. Her mother accompanies her today.    HISTORY: (copied from Dr Gladstone Lighter note on  01/01/2019)   35 year old female here for evaluation of seizure.   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  This was treated with craniotomy and clipping surgery procedure.  Hospital course was complicated with left ACA ischemic infarct.  Patient had resultant right-sided weakness.  Patient then transition to inpatient rehabilitation.   11/09/2018 patient had seizure in her sleep and went to the emergency room.  Apparently patient was having convulsions.  She was slightly confused after waking up.  CT and CTA scans were performed which showed no acute findings.  Patient was started on levetiracetam 5 mg twice a day and discharged home.   Since that time no further seizures.  No side effects of medication.   REVIEW OF SYSTEMS: Out of a complete 14 system review of symptoms, the patient complains only of the following symptoms, seizures and all other reviewed systems are negative.   ALLERGIES: Allergies  Allergen Reactions   Sulfa Antibiotics Swelling    Causes swelling on the face.   Shellfish Allergy Other (See Comments)    Stomach upset    HOME MEDICATIONS: Outpatient Medications Prior to Visit  Medication Sig Dispense Refill   acetaminophen (TYLENOL) 325 MG tablet Take 1-2 tablets (325-650 mg total) by mouth every 4 (four) hours as needed for mild pain.     amLODipine (NORVASC) 10 MG tablet Take 1 tablet (10 mg total) by mouth daily. 90 tablet 1   EQ ALLERGY RELIEF, CETIRIZINE, 10 MG tablet Take 1 tablet by mouth once daily 90 tablet 1   fluticasone (  FLONASE) 50 MCG/ACT nasal spray Place 1 spray into both nostrils daily. 16 g 6   levETIRAcetam (KEPPRA) 1000 MG tablet Take 1 tablet (1,000 mg total) by mouth 2 (two) times daily. 180 tablet 4   montelukast (SINGULAIR) 10 MG tablet Take 1 tablet (10 mg total) by mouth at bedtime. 90 tablet 1   Multiple Vitamin  (MULTIVITAMIN WITH MINERALS) TABS tablet Take 1 tablet by mouth daily.     sodium chloride (OCEAN) 0.65 % SOLN nasal spray Place 1 spray into both nostrils 2 (two) times daily before lunch and supper.  0   No facility-administered medications prior to visit.    PAST MEDICAL HISTORY: Past Medical History:  Diagnosis Date   Asthma, allergic    uses inhaler prn - infrequently   Bilateral thoracic back pain 09/13/2015   Eczema    Hypertension    Lactose intolerance    Mature cystic teratoma of ovary    Mood swings 06/01/2015   Seasonal allergies    Seizures ([place])    Stroke ([place]) 04/2018   during aneurysm surgery    PAST SURGICAL HISTORY: Past Surgical History:  Procedure Laterality Date   CRANIOTOMY N/A 05/10/2018   Procedure: 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;  Surgeon: Consuella Lose, MD;  Location: [facility];  Service: Neurosurgery;  Laterality: N/A;   IR 3D INDEPENDENT WKST  05/10/2018   IR ANGIO INTRA EXTRACRAN SEL INTERNAL CAROTID BILAT MOD SED  05/10/2018   IR ANGIO INTRA EXTRACRAN SEL INTERNAL CAROTID BILAT MOD SED  05/30/2019   IR ANGIO VERTEBRAL SEL VERTEBRAL BILAT MOD SED  05/10/2018   IR ANGIO VERTEBRAL SEL VERTEBRAL UNI R MOD SED  05/30/2019   IR US GUIDE VASC ACCESS RIGHT  05/30/2019   RADIOLOGY WITH ANESTHESIA N/A 05/10/2018   Procedure: EMBOLIZATION OF ANEURSYM;  Surgeon: Consuella Lose, MD;  Location: [facility];  Service: Radiology;  Laterality: N/A;   TUBAL LIGATION N/A 12/15/2016   Procedure: POST PARTUM TUBAL LIGATION;  Surgeon: Mora Bellman, MD;  Location: [facility];  Service: Gynecology;  Laterality: N/A;   WISDOM TOOTH EXTRACTION      FAMILY HISTORY: Family History  Adopted: Yes  Problem Relation Age of Onset   Hypertension Father    Hypertension Maternal Aunt    Hypertension Maternal Uncle    Hypertension Maternal Grandmother    Eczema Maternal Grandmother    Hypertension Maternal Grandfather     Hypertension Paternal Grandmother    Hypertension Mother    Eczema Mother     SOCIAL HISTORY: Social History   Socioeconomic History   Marital status: Single    Spouse name: Not on file   Number of children: 2   Years of education: Not on file   Highest education level: Some college, no degree  Occupational History   Not on file  Tobacco Use   Smoking status: Every Day    Packs/day: 0.25    Types: Cigarettes   Smokeless tobacco: Former  Scientific laboratory technician Use: Never used  Substance and Sexual Activity   Alcohol use: No   Drug use: Not Currently    Types: Marijuana    Comment: used to yrs ago   Sexual activity: Not Currently    Birth control/protection: None  Other Topics Concern   Not on file  Social History Narrative   Lives with 2 children   Coffee 2 cups daily   Social Determinants of Health   Financial Resource Strain: Not on file  Food Insecurity: Not on file  Transportation Needs: Not on file  Physical Activity: Not on file  Stress: Not on file  Social Connections: Not on file  Intimate Partner Violence: Not on file      PHYSICAL EXAM  There were no vitals filed for this visit.  There is no height or weight on file to calculate BMI.  Generalized: Well developed, in no acute distress  Cardiology: normal rate and rhythm, no murmur noted Respiratory: clear to auscultation bilaterally  Neurological examination  Mentation: Alert oriented to time, place, history taking. Follows all commands speech and language fluent Cranial nerve II-XII: Pupils were equal round reactive to light. Extraocular movements were full, visual field were full on confrontational test. Facial sensation and strength were normal. Uvula tongue midline. Head turning and shoulder shrug  were normal and symmetric. Motor: The motor testing reveals 5 over 5 strength of all 4 extremities. Good symmetric motor tone is noted throughout.  Sensory: Sensory testing is intact to soft touch on  all 4 extremities. No evidence of extinction is noted.  Coordination: Cerebellar testing reveals good finger-nose-finger and heel-to-shin bilaterally.  Gait and station: Gait is normal. Reflexes: Deep tendon reflexes are symmetric and normal bilaterally.    DIAGNOSTIC DATA (LABS, IMAGING, TESTING) - I reviewed patient records, labs, notes, testing and imaging myself where available.  No flowsheet data found.   Lab Results  Component Value Date   WBC 3.8 (L) 05/30/2019   HGB 14.4 05/30/2019   HCT 42.1 05/30/2019   MCV 96.3 05/30/2019   PLT 170 05/30/2019      Component Value Date/Time   NA 144 01/07/2020 1535   K 4.3 01/07/2020 1535   CL 108 (H) 01/07/2020 1535   CO2 23 01/07/2020 1535   GLUCOSE 81 01/07/2020 1535   GLUCOSE 90 05/30/2019 0722   GLUCOSE 70 08/25/2014 1214   BUN 8 01/07/2020 1535   CREATININE 0.63 01/07/2020 1535   CREATININE 0.40 (L) 06/29/2016 1504   CALCIUM 9.7 01/07/2020 1535   PROT 6.5 11/09/2018 1156   PROT 7.1 08/01/2017 0934   ALBUMIN 3.4 (L) 11/09/2018 1156   ALBUMIN 4.4 08/01/2017 0934   AST 25 11/09/2018 1156   ALT 21 11/09/2018 1156   ALKPHOS 79 11/09/2018 1156   BILITOT 0.2 (L) 11/09/2018 1156   BILITOT 0.4 08/01/2017 0934   GFRNONAA 117 01/07/2020 1535   GFRNONAA >89 07/02/2015 0945   GFRAA 135 01/07/2020 1535   GFRAA >89 07/02/2015 0945   Lab Results  Component Value Date   CHOL 168 08/01/2017   HDL 53 08/01/2017   LDLCALC 105 (H) 08/01/2017   TRIG 133 05/13/2018   CHOLHDL 3.2 08/01/2017   Lab Results  Component Value Date   HGBA1C 4.3 (L) 05/28/2018   No results found for: VITAMINB12 Lab Results  Component Value Date   TSH 0.82 07/20/2016       ASSESSMENT AND PLAN 35 y.o. year old female  has a past medical history of Asthma, allergic, Bilateral thoracic back pain (09/13/2015), Eczema, Hypertension, Lactose intolerance, Mature cystic teratoma of ovary, Mood swings (06/01/2015), Seasonal allergies, Seizures ([place]), and  Stroke ([place]) (04/2018). here with   No diagnosis found.   Espyn is doing very well. We will continue levetiracetam '1000mg'$  twice daily. She was educated on the importance of medication compliance. She will continue close follow up with PCP. She was encouraged to continue healthy lifestyle habits. She will follow up in 1 year, sooner if needed. She verbalizes understanding  and agreement with this plan.    No orders of the defined types were placed in this encounter.    No orders of the defined types were placed in this encounter.     Debbora Presto, FNP-C 02/17/2021, 3:04 PM [facility] [address], [place], [place] [address] [phone]

## 2021-02-22 ENCOUNTER — Ambulatory Visit: Payer: Medicare Other | Admitting: Family Medicine

## 2021-07-06 ENCOUNTER — Ambulatory Visit: Payer: Medicare Other | Admitting: Family Medicine

## 2021-09-05 ENCOUNTER — Encounter: Payer: Self-pay | Admitting: Family Medicine

## 2021-09-05 ENCOUNTER — Other Ambulatory Visit: Payer: Self-pay

## 2021-09-05 ENCOUNTER — Ambulatory Visit: Payer: Medicare Other | Attending: Family Medicine | Admitting: Family Medicine

## 2021-09-05 VITALS — BP 125/80 | HR 88 | Ht 67.0 in | Wt 170.6 lb

## 2021-09-05 DIAGNOSIS — Z8679 Personal history of other diseases of the circulatory system: Secondary | ICD-10-CM | POA: Insufficient documentation

## 2021-09-05 DIAGNOSIS — F17210 Nicotine dependence, cigarettes, uncomplicated: Secondary | ICD-10-CM | POA: Insufficient documentation

## 2021-09-05 DIAGNOSIS — I6930 Unspecified sequelae of cerebral infarction: Secondary | ICD-10-CM | POA: Diagnosis not present

## 2021-09-05 DIAGNOSIS — J309 Allergic rhinitis, unspecified: Secondary | ICD-10-CM

## 2021-09-05 DIAGNOSIS — Z86718 Personal history of other venous thrombosis and embolism: Secondary | ICD-10-CM | POA: Diagnosis not present

## 2021-09-05 DIAGNOSIS — R531 Weakness: Secondary | ICD-10-CM | POA: Diagnosis not present

## 2021-09-05 DIAGNOSIS — Z7182 Exercise counseling: Secondary | ICD-10-CM | POA: Insufficient documentation

## 2021-09-05 DIAGNOSIS — I10 Essential (primary) hypertension: Secondary | ICD-10-CM | POA: Insufficient documentation

## 2021-09-05 DIAGNOSIS — G40909 Epilepsy, unspecified, not intractable, without status epilepticus: Secondary | ICD-10-CM | POA: Diagnosis not present

## 2021-09-05 DIAGNOSIS — Z720 Tobacco use: Secondary | ICD-10-CM

## 2021-09-05 DIAGNOSIS — R569 Unspecified convulsions: Secondary | ICD-10-CM | POA: Diagnosis not present

## 2021-09-05 DIAGNOSIS — Z713 Dietary counseling and surveillance: Secondary | ICD-10-CM | POA: Diagnosis not present

## 2021-09-05 DIAGNOSIS — Z79899 Other long term (current) drug therapy: Secondary | ICD-10-CM | POA: Diagnosis not present

## 2021-09-05 DIAGNOSIS — Z8673 Personal history of transient ischemic attack (TIA), and cerebral infarction without residual deficits: Secondary | ICD-10-CM | POA: Diagnosis not present

## 2021-09-05 DIAGNOSIS — R4701 Aphasia: Secondary | ICD-10-CM | POA: Diagnosis not present

## 2021-09-05 MED ORDER — FLUTICASONE PROPIONATE 50 MCG/ACT NA SUSP: 1.0000 | Freq: Every day | NASAL | 6 refills | Status: DC

## 2021-09-05 MED ORDER — AMLODIPINE BESYLATE 10 MG PO TABS: 10.0000 mg | ORAL_TABLET | Freq: Every day | ORAL | 1 refills | Status: DC

## 2021-09-05 MED ORDER — MONTELUKAST SODIUM 10 MG PO TABS: 10.0000 mg | ORAL_TABLET | Freq: Every day | ORAL | 1 refills | Status: DC

## 2021-09-05 NOTE — Patient Instructions (Signed)
Steps to Quit Smoking ?Smoking tobacco is the leading cause of preventable death. It can affect almost every organ in the body. Smoking puts you and those around you at risk for developing many serious chronic diseases. Quitting smoking can be difficult, but it is one of the best things that you can do for your health. It is never too late to quit. ?How do I get ready to quit? ?When you decide to quit smoking, create a plan to help you succeed. Before you quit: ?Pick a date to quit. Set a date within the next 2 weeks to give you time to prepare. ?Write down the reasons why you are quitting. Keep this list in places where you will see it often. ?Tell your family, friends, and co-workers that you are quitting. Support from your loved ones can make quitting easier. ?Talk with your health care provider about your options for quitting smoking. ?Find out what treatment options are covered by your health insurance. ?Identify people, places, things, and activities that make you want to smoke (triggers). Avoid them. ?What first steps can I take to quit smoking? ?Throw away all cigarettes at home, at work, and in your car. ?Throw away smoking accessories, such as Scientist, research (medical). ?Clean your car. Make sure to empty the ashtray. ?Clean your home, including curtains and carpets. ?What strategies can I use to quit smoking? ?Talk with your health care provider about combining strategies, such as taking medicines while you are also receiving in-person counseling. Using these two strategies together makes you more likely to succeed in quitting than if you used either strategy on its own. ?If you are pregnant or breastfeeding, talk with your health care provider about finding counseling or other support strategies to quit smoking. Do not take medicine to help you quit smoking unless your health care provider tells you to do so. ?To quit smoking: ?Quit right away ?Quit smoking completely, instead of gradually reducing how much  you smoke over a period of time. Research shows that stopping smoking right away is more successful than gradually quitting. ?Attend in-person counseling to help you build problem-solving skills. You are more likely to succeed in quitting if you attend counseling sessions regularly. Even short sessions of 10 minutes can be effective. ?Take medicine ?You may take medicines to help you quit smoking. Some medicines require a prescription and some you can purchase over-the-counter. Medicines may have nicotine in them to replace the nicotine in cigarettes. Medicines may: ?Help to stop cravings. ?Help to relieve withdrawal symptoms. ?Your health care provider may recommend: ?Nicotine patches, gum, or lozenges. ?Nicotine inhalers or sprays. ?Non-nicotine medicine that is taken by mouth. ?Find resources ?Find resources and support systems that can help you to quit smoking and remain smoke-free after you quit. These resources are most helpful when you use them often. They include: ?Online chats with a Social worker. ?Telephone quitlines. ?Careers information officer. ?Support groups or group counseling. ?Text messaging programs. ?Mobile phone apps or applications. Use apps that can help you stick to your quit plan by providing reminders, tips, and encouragement. There are many free apps for mobile devices as well as websites. Examples include Quit Guide from the [employer] and smokefree.gov ?What things can I do to make it easier to quit? ? ?Reach out to your family and friends for support and encouragement. Call telephone quitlines (1-800-QUIT-NOW), reach out to support groups, or work with a counselor for support. ?Ask people who smoke to avoid smoking around you. ?Avoid places that trigger you  to smoke, such as bars, parties, or smoke-break areas at work. ?Spend time with people who do not smoke. ?Lessen the stress in your life. Stress can be a smoking trigger for some people. To lessen stress, try: ?Exercising regularly. ?Doing  deep-breathing exercises. ?Doing yoga. ?Meditating. ?Performing a body scan. This involves closing your eyes, scanning your body from head to toe, and noticing which parts of your body are particularly tense. Try to relax the muscles in those areas. ?How will I feel when I quit smoking? ?Day 1 to 3 weeks ?Within the first 24 hours of quitting smoking, you may start to feel withdrawal symptoms. These symptoms are usually most noticeable 2-3 days after quitting, but they usually do not last for more than 2-3 weeks. You may experience these symptoms: ?Mood swings. ?Restlessness, anxiety, or irritability. ?Trouble concentrating. ?Dizziness. ?Strong cravings for sugary foods and nicotine. ?Mild weight gain. ?Constipation. ?Nausea. ?Coughing or a sore throat. ?Changes in how the medicines that you take for unrelated issues work in your body. ?Depression. ?Trouble sleeping (insomnia). ?Week 3 and afterward ?After the first 2-3 weeks of quitting, you may start to notice more positive results, such as: ?Improved sense of smell and taste. ?Decreased coughing and sore throat. ?Slower heart rate. ?Lower blood pressure. ?Clearer skin. ?The ability to breathe more easily. ?Fewer sick days. ?Quitting smoking can be very challenging. Do not get discouraged if you are not successful the first time. Some people need to make many attempts to quit before they achieve long-term success. Do your best to stick to your quit plan, and talk with your health care provider if you have any questions or concerns. ?Summary ?Smoking tobacco is the leading cause of preventable death. Quitting smoking is one of the best things that you can do for your health. ?When you decide to quit smoking, create a plan to help you succeed. ?Quit smoking right away, not slowly over a period of time. ?When you start quitting, seek help from your health care provider, family, or friends. ?This information is not intended to replace advice given to you by your  health care provider. Make sure you discuss any questions you have with your health care provider. ?Document Revised: 02/11/2021 Document Reviewed: 08/24/2018 ?Elsevier Patient Education ? [place]. ? ?

## 2021-09-05 NOTE — Progress Notes (Signed)
? ?Subjective:  ?Patient ID: [name] [name], female    DOB: 1986-02-27  Age: 36 y.o. MRN: [id number] ? ?CC: Hypertension ? ? ?HPI ?[name] [name] is a 36 y.o. 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 ?Interval History: ?She has no recent falls or seizures but still has a slight R sided weakness. She is not back to her baseline yet.  Has not had any seizures lately. ?Last visit with neurology was 1 year ago. ?She still smokes ~ 7 cig/day and is not ready to quit.  States she keeps smoking due to stress of wheezing 2 little children. ?She walks about  68mle/day. ? ?Endorses compliance with her antihypertensive and has no adverse effects from her medications. ?She denies additional concerns today. ?Past Medical History:  ?Diagnosis Date  ? Asthma, allergic   ? uses inhaler prn - infrequently  ? Bilateral thoracic back pain 09/13/2015  ? Eczema   ? Hypertension   ? Lactose intolerance   ? Mature cystic teratoma of ovary   ? Mood swings 06/01/2015  ? Seasonal allergies   ? [facility]ures [facility]   ? Stroke ([facility] 04/2018  ? during aneurysm surgery  ? ? ?Past Surgical History:  ?Procedure Laterality Date  ? CRANIOTOMY N/A 05/10/2018  ? Procedure: 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;  Surgeon: NConsuella Lose MD;  [facility]  Service: Neurosurgery;  Laterality: N/A;  ? IR 3D INDEPENDENT WKST  05/10/2018  ? IR ANGIO INTRA EXTRACRAN SEL INTERNAL CAROTID BILAT MOD SED  05/10/2018  ? IR ANGIO INTRA EXTRACRAN SEL INTERNAL CAROTID BILAT MOD SED  05/30/2019  ? IR ANGIO VERTEBRAL SEL VERTEBRAL BILAT MOD SED  05/10/2018  ? IR ANGIO VERTEBRAL SEL VERTEBRAL UNI R MOD SED  05/30/2019  ? IR  [place] VASC ACCESS RIGHT  05/30/2019  ? RADIOLOGY WITH ANESTHESIA N/A 05/10/2018  ? Procedure: EMBOLIZATION OF ANEURSYM;  Surgeon: NConsuella Lose MD;  [facility]  Service: Radiology;  Laterality: N/A;  ? TUBAL LIGATION N/A 12/15/2016  ? Procedure: POST PARTUM TUBAL LIGATION;  Surgeon: CMora Bellman MD;  [facility];  Service: Gynecology;  Laterality: N/A;  ? WISDOM TOOTH EXTRACTION    ? ? ?Family History  ?Adopted: Yes  ?Problem Relation Age of Onset  ? Hypertension Father   ? Hypertension Maternal Aunt   ? Hypertension Maternal Uncle   ? Hypertension Maternal Grandmother   ? Eczema Maternal Grandmother   ? Hypertension Maternal Grandfather   ? Hypertension Paternal Grandmother   ? Hypertension Mother   ? Eczema Mother   ? ? ?Social History  ? ?Socioeconomic History  ? Marital status: Single  ?  Spouse name: Not on file  ? Number of children: 2  ? Years of education: Not on file  ? Highest education level: Some college, no degree  ?Occupational History  ? Not on file  ?Tobacco Use  ? Smoking status: Every Day  ?  Packs/day: 0.25  ?  Types: Cigarettes  ? Smokeless tobacco: Former  ?Vaping Use  ? Vaping Use: Never used  ?Substance and Sexual Activity  ? Alcohol use: No  ? Drug use: Not Currently  ?  Types: Marijuana  ?  Comment: used to yrs ago  ? Sexual activity:  Not Currently  ?  Birth control/protection: None  ?Other Topics Concern  ? Not on file  ?Social History Narrative  ? Lives with 2 children  ? Coffee 2 cups daily  ? ?Social Determinants of Health  ? ?Financial Resource Strain: Not on file  ?Food Insecurity: Not on file  ?Transportation Needs: Not on file  ?Physical Activity: Not on file  ?Stress: Not on file  ?Social Connections: Not on file  ? ? ?Allergies  ?Allergen Reactions  ? Sulfa Antibiotics Swelling  ?  Causes swelling on the face.  ? Shellfish Allergy Other (See Comments)  ?  Stomach upset  ? ? ?Outpatient Medications Prior to Visit  ?Medication Sig Dispense Refill  ?  acetaminophen (TYLENOL) 325 MG tablet Take 1-2 tablets (325-650 mg total) by mouth every 4 (four) hours as needed for mild pain.    ? EQ ALLERGY RELIEF, CETIRIZINE, 10 MG tablet Take 1 tablet by mouth once daily 90 tablet 1  ? levETIRAcetam (KEPPRA) 1000 MG tablet Take 1 tablet (1,000 mg total) by mouth 2 (two) times daily. 180 tablet 4  ? Multiple Vitamin (MULTIVITAMIN WITH MINERALS) TABS tablet Take 1 tablet by mouth daily.    ? sodium chloride (OCEAN) 0.65 % SOLN nasal spray Place 1 spray into both nostrils 2 (two) times daily before lunch and supper.  0  ? amLODipine (NORVASC) 10 MG tablet Take 1 tablet (10 mg total) by mouth daily. 90 tablet 1  ? fluticasone (FLONASE) 50 MCG/ACT nasal spray Place 1 spray into both nostrils daily. 16 g 6  ? montelukast (SINGULAIR) 10 MG tablet Take 1 tablet (10 mg total) by mouth at bedtime. 90 tablet 1  ? ?No facility-administered medications prior to visit.  ? ? ? ?ROS ?Review of Systems  ?Constitutional:  Negative for activity change, appetite change and fatigue.  ?HENT:  Negative for congestion, sinus pressure and sore throat.   ?Eyes:  Negative for visual disturbance.  ?Respiratory:  Negative for cough, chest tightness, shortness of breath and wheezing.   ?Cardiovascular:  Negative for chest pain and palpitations.  ?Gastrointestinal:  Negative for abdominal distention, abdominal pain and constipation.  ?Endocrine: Negative for polydipsia.  ?Genitourinary:  Negative for dysuria and frequency.  ?Musculoskeletal:  Negative for arthralgias and back pain.  ?Skin:  Negative for rash.  ?Neurological:  Positive for weakness. Negative for tremors, light-headedness and numbness.  ?Hematological:  Does not bruise/bleed easily.  ?Psychiatric/Behavioral:  Negative for agitation and behavioral problems.   ? ?Objective:  ?BP 125/80   Pulse 88   Ht '5\' 7"'$  (1.702 m)   Wt 170 lb 9.6 oz (77.4 kg)   SpO2 100%   BMI 26.72 kg/m?  ? ?BP/Weight 09/05/2021 01/03/2021 09/06/2020  ?Systolic BP 983  382 505  ?Diastolic BP 80 73 82  ?Wt. (Lbs) 170.6 162.2 166.6  ?BMI 26.72 25.4 26.09  ? ? ? ? ?Physical Exam ?Constitutional:   ?   Appearance: She is well-developed.  ?Cardiovascular:  ?   Rate and Rhythm: Normal rate.  ?   Heart sounds: Normal heart sounds. No murmur heard. ?Pulmonary:  ?   Effort: Pulmonary effort is normal.  ?   Breath sounds: Normal breath sounds. No wheezing or rales.  ?Chest:  ?   Chest wall: No tenderness.  ?Abdominal:  ?   General: Bowel sounds are normal. There is no distension.  ?   Palpations: Abdomen is soft. There is no mass.  ?   Tenderness: There is no abdominal  tenderness.  ?Musculoskeletal:     ?   General: Normal range of motion.  ?   Right lower leg: No edema.  ?   Left lower leg: No edema.  ?Neurological:  ?   Mental Status: She is alert and oriented to person, place, and time.  ?   Gait: Gait is intact.  ?   Comments: Strength: ?Upper extremity: Right-4+/5, left-5/5 ?Lower extremity-Right-4/5, left 5/5  ?Psychiatric:     ?   Mood and Affect: Mood normal.  ? ? ?CMP Latest Ref Rng & Units 01/07/2020 05/30/2019 04/04/2019  ?Glucose 65 - 99 mg/dL 81 90 94  ?BUN 6 - 20 mg/dL 8 <5(L) 5(L)  ?Creatinine 0.57 - 1.00 mg/dL 0.63 0.97 0.74  ?Sodium 134 - 144 mmol/L 144 142 138  ?Potassium 3.5 - 5.2 mmol/L 4.3 3.4(L) 3.8  ?Chloride 96 - 106 mmol/L 108(H) 108 108  ?CO2 20 - 29 mmol/L 23 24 21(L)  ?Calcium 8.7 - 10.2 mg/dL 9.7 9.4 8.9  ?Total Protein 6.5 - 8.1 g/dL - - -  ?Total Bilirubin 0.3 - 1.2 mg/dL - - -  ?Alkaline Phos 38 - 126 U/L - - -  ?AST 15 - 41 U/L - - -  ?ALT 0 - 44 U/L - - -  ? ? ?Lipid Panel  ?   ?Component Value Date/Time  ? CHOL 168 08/01/2017 0934  ? TRIG 133 05/13/2018 2230  ? HDL 53 08/01/2017 0934  ? CHOLHDL 3.2 08/01/2017 0934  ? CHOLHDL 4.4 07/02/2015 0945  ? VLDL 12 07/02/2015 0945  ? LDLCALC 105 (H) 08/01/2017 0934  ? ? ?CBC ?   ?Component Value Date/Time  ? WBC 3.8 (L) 05/30/2019 3354  ? RBC 4.37 05/30/2019 0722  ? HGB 14.4 05/30/2019 0722  ? HGB 11.0 (L) 09/25/2016  0902  ? HCT 42.1 05/30/2019 0722  ? HCT 33.9 (L) 09/25/2016 0902  ? PLT 170 05/30/2019 0722  ? PLT 198 09/25/2016 0902  ? MCV 96.3 05/30/2019 0722  ? MCV 98 (H) 09/25/2016 0902  ? MCH 33.0 05/30/2019 0722  ? MCHC 34.2

## 2021-09-05 NOTE — Progress Notes (Signed)
Medication refills

## 2021-09-06 LAB — CMP14+EGFR
ALT: 12 IU/L (ref 0–32)
AST: 17 IU/L (ref 0–40)
Albumin/Globulin Ratio: 1.9 (ref 1.2–2.2)
Albumin: 4.4 g/dL (ref 3.8–4.8)
Alkaline Phosphatase: 67 IU/L (ref 44–121)
BUN/Creatinine Ratio: 8 — ABNORMAL LOW (ref 9–23)
BUN: 5 mg/dL — ABNORMAL LOW (ref 6–20)
Bilirubin Total: <0.2 mg/dL (ref 0.0–1.2)
CO2: 25 mmol/L (ref 20–29)
Calcium: 9.4 mg/dL (ref 8.7–10.2)
Chloride: 106 mmol/L (ref 96–106)
Creatinine, Ser: 0.62 mg/dL (ref 0.57–1.00)
Globulin, Total: 2.3 g/dL (ref 1.5–4.5)
Glucose: 88 mg/dL (ref 70–99)
Potassium: 4.3 mmol/L (ref 3.5–5.2)
Sodium: 142 mmol/L (ref 134–144)
Total Protein: 6.7 g/dL (ref 6.0–8.5)
eGFR: 119 mL/min/{1.73_m2} (ref >59)

## 2021-10-06 ENCOUNTER — Emergency Department (HOSPITAL_COMMUNITY)
Admission: EM | Admit: 2021-10-06 | Discharge: 2021-10-06 | Disposition: A | Discharge status: Home / Self Care | Payer: Medicare Other | Attending: Emergency Medicine | Admitting: Emergency Medicine

## 2021-10-06 ENCOUNTER — Encounter (HOSPITAL_COMMUNITY): Payer: Self-pay | Admitting: Pharmacy Technician

## 2021-10-06 ENCOUNTER — Other Ambulatory Visit: Payer: Self-pay

## 2021-10-06 DIAGNOSIS — G8191 Hemiplegia, unspecified affecting right dominant side: Secondary | ICD-10-CM | POA: Diagnosis not present

## 2021-10-06 DIAGNOSIS — R569 Unspecified convulsions: Secondary | ICD-10-CM | POA: Insufficient documentation

## 2021-10-06 DIAGNOSIS — R519 Headache, unspecified: Secondary | ICD-10-CM | POA: Diagnosis not present

## 2021-10-06 LAB — URINALYSIS, ROUTINE W REFLEX MICROSCOPIC
Bilirubin Urine: NEGATIVE
Glucose, UA: NEGATIVE mg/dL
Hgb urine dipstick: NEGATIVE
Ketones, ur: NEGATIVE mg/dL
Nitrite: NEGATIVE
Protein, ur: NEGATIVE mg/dL
Specific Gravity, Urine: 1.008 (ref 1.005–1.030)
pH: 7 (ref 5.0–8.0)

## 2021-10-06 LAB — BASIC METABOLIC PANEL
Anion gap: 6 (ref 5–15)
BUN: <5 mg/dL — ABNORMAL LOW (ref 6–20)
CO2: 25 mmol/L (ref 22–32)
Calcium: 9.3 mg/dL (ref 8.9–10.3)
Chloride: 109 mmol/L (ref 98–111)
Creatinine, Ser: 0.65 mg/dL (ref 0.44–1.00)
GFR, Estimated: >60 mL/min (ref >60)
Glucose, Bld: 97 mg/dL (ref 70–99)
Potassium: 4 mmol/L (ref 3.5–5.1)
Sodium: 140 mmol/L (ref 135–145)

## 2021-10-06 LAB — CBC
HCT: 40.2 % (ref 36.0–46.0)
Hemoglobin: 13 g/dL (ref 12.0–15.0)
MCH: 33.5 pg (ref 26.0–34.0)
MCHC: 32.3 g/dL (ref 30.0–36.0)
MCV: 103.6 fL — ABNORMAL HIGH (ref 80.0–100.0)
Platelets: 272 10*3/uL (ref 150–400)
RBC: 3.88 MIL/uL (ref 3.87–5.11)
RDW: 12.6 % (ref 11.5–15.5)
WBC: 5.3 10*3/uL (ref 4.0–10.5)
nRBC: 0 % (ref 0.0–0.2)

## 2021-10-06 LAB — ACETAMINOPHEN LEVEL: Acetaminophen (Tylenol), Serum: <10 ug/mL — ABNORMAL LOW (ref 10–30)

## 2021-10-06 LAB — ETHANOL: Alcohol, Ethyl (B): <10 mg/dL (ref <10)

## 2021-10-06 LAB — LACTIC ACID, PLASMA: Lactic Acid, Venous: 0.7 mmol/L (ref 0.5–1.9)

## 2021-10-06 LAB — CK: Total CK: 107 U/L (ref 38–234)

## 2021-10-06 LAB — CBG MONITORING, ED: Glucose-Capillary: 92 mg/dL (ref 70–99)

## 2021-10-06 LAB — I-STAT BETA HCG BLOOD, ED (MC, WL, AP ONLY): I-stat hCG, quantitative: <5 m[IU]/mL (ref <5)

## 2021-10-06 LAB — SALICYLATE LEVEL: Salicylate Lvl: <7 mg/dL — ABNORMAL LOW (ref 7.0–30.0)

## 2021-10-06 MED ORDER — LEVETIRACETAM 1000 MG PO TABS: 1000.0000 mg | ORAL_TABLET | Freq: Two times a day (BID) | ORAL | 0 refills | Status: DC

## 2021-10-06 MED ORDER — LEVETIRACETAM IN NACL 1000 MG/100ML IV SOLN
1000.0000 mg | Freq: Once | INTRAVENOUS | Status: AC
  Administered 2021-10-06: 1000 mg via INTRAVENOUS
  Filled 2021-10-06: qty 100

## 2021-10-06 NOTE — Discharge Instructions (Addendum)
It was a pleasure taking care of you today!  ? ?Your labs were unremarkable in the ED. You will be sent home with a prescription for keppra, take as directed.  Maintain your follow-up appointment with your neurologist as scheduled.  You may follow-up with your primary care provider as needed.  Return to the emergency department for experiencing increasing/worsening seizure-like activity, fever, worsening symptoms. ?

## 2021-10-06 NOTE — ED Triage Notes (Addendum)
Pt here with reports of seizure X2 today. Pt with hx of same. Pt missed keppra dose last night. Pt last seizure was almost a year ago. Pt alert and oriented X4. Pt also with worsening R leg weakness since having her stroke this morning.  ?

## 2021-10-06 NOTE — ED Provider Notes (Signed)
?[place] ?Provider Note ? ? ?CSN: [id number] ?Arrival date & time: 10/06/21  1231 ? ?  ? ?History ? ?Chief Complaint  ?Patient presents with  ? Seizures  ? ? ?[name] [name] is a 36 y.o. female with a PMHx of seizures who presents to the ED complaining of seizures onset PTA. Pt notes that per her boyfriend, she had 2 seizures today lasting approximately 3 minutes while she was asleep. Boyfriend informed patient that during her second seizure she lost control of her bladder. Pt aunt notes that patient was able to ambulate to the car following the seizures to be taken into the ED for evaluation. Pt notes that she feels at her baseline currently. She has associated headache. Pt notes that she is supposed to take keppra however, she has been out of her medication for 2 days. She has had a total of 4 seizures since 2019 with her most recent one being in 2021. ? ? ?The history is provided by the patient, a parent and a relative. No language interpreter was used.  ?Seizures ?Return to baseline: yes   ?Context: medical non-compliance   ?Context: not alcohol withdrawal, not change in medication, not sleeping less, not drug use, not pregnant, not previous head injury and not stress   ?History of seizures: yes   ? ?  ? ?Home Medications ?Prior to Admission medications   ?Medication Sig Start Date End Date Taking? Authorizing Provider  ?acetaminophen (TYLENOL) 325 MG tablet Take 1-2 tablets (325-650 mg total) by mouth every 4 (four) hours as needed for mild pain. ?Patient taking differently: Take 325 mg by mouth daily as needed for mild pain (pain). 06/24/18  Yes Love, Ivan Anchors, PA-C  ?amLODipine (NORVASC) 10 MG tablet Take 1 tablet (10 mg total) by mouth daily. ?Patient taking differently: Take 10 mg by mouth at bedtime. 09/05/21  Yes Charlott Rakes, MD  ?EQ ALLERGY RELIEF, CETIRIZINE, 10 MG tablet Take 1 tablet by mouth once daily ?Patient taking differently: Take 10 mg by mouth at  bedtime. 08/07/20  Yes Charlott Rakes, MD  ?fluticasone (FLONASE) 50 MCG/ACT nasal spray Place 1 spray into both nostrils daily. ?Patient taking differently: Place 1 spray into both nostrils at bedtime. 09/05/21  Yes Charlott Rakes, MD  ?montelukast (SINGULAIR) 10 MG tablet Take 1 tablet (10 mg total) by mouth at bedtime. 09/05/21  Yes Charlott Rakes, MD  ?Multiple Vitamin (MULTIVITAMIN WITH MINERALS) TABS tablet Take 1 tablet by mouth daily. ?Patient taking differently: Take 1 tablet by mouth at bedtime. 06/25/18  Yes Love, Ivan Anchors, PA-C  ?TRIAMCINOLONE ACETONIDE EX Apply 1 application. topically daily as needed (eczema flare).   Yes [provider]  ?levETIRAcetam (KEPPRA) 1000 MG tablet Take 1 tablet (1,000 mg total) by mouth 2 (two) times daily. 10/06/21 11/05/21  Greidys Deland A, PA-C  ?   ? ?Allergies    ?Sulfa antibiotics and Shellfish allergy   ? ?Review of Systems   ?Review of Systems  ?Constitutional:  Negative for chills and fever.  ?Respiratory:  Negative for shortness of breath.   ?Cardiovascular:  Negative for chest pain.  ?Neurological:  Positive for seizures and headaches.  ?All other systems reviewed and are negative. ? ?Physical Exam ?Updated Vital Signs ?BP 123/89   Pulse 60   Temp 98.2 ?F (36.8 ?C) (Oral)   Resp 14   SpO2 100%  ?Physical Exam ?Vitals and nursing note reviewed.  ?Constitutional:   ?   General: She is not in acute  distress. ?   Appearance: She is not diaphoretic.  ?HENT:  ?   Head: Normocephalic and atraumatic.  ?   Mouth/Throat:  ?   Pharynx: No oropharyngeal exudate.  ?Eyes:  ?   General: No scleral icterus. ?   Extraocular Movements: Extraocular movements intact.  ?   Conjunctiva/sclera: Conjunctivae normal.  ?   Pupils: Pupils are equal, round, and reactive to light.  ?Cardiovascular:  ?   Rate and Rhythm: Normal rate and regular rhythm.  ?   Pulses: Normal pulses.  ?   Heart sounds: Normal heart sounds.  ?Pulmonary:  ?   Effort: Pulmonary effort is normal. No  respiratory distress.  ?   Breath sounds: Normal breath sounds. No wheezing.  ?Abdominal:  ?   General: Bowel sounds are normal.  ?   Palpations: Abdomen is soft. There is no mass.  ?   Tenderness: There is no abdominal tenderness. There is no guarding or rebound.  ?Musculoskeletal:     ?   General: Normal range of motion.  ?   Cervical back: Normal range of motion and neck supple.  ?   Comments: Residual right sided weakness status post SAH in 2019  ?Skin: ?   General: Skin is warm and dry.  ?Neurological:  ?   Mental Status: She is alert and oriented to person, place, and time.  ?   Cranial Nerves: Cranial nerves 2-12 are intact.  ?   Sensory: Sensation is intact.  ?   Motor: No pronator drift.  ?Psychiatric:     ?   Behavior: Behavior normal.  ? ? ?ED Results / Procedures / Treatments   ?Labs ?(all labs ordered are listed, but only abnormal results are displayed) ?Labs Reviewed  ?BASIC METABOLIC PANEL - Abnormal; Notable for the following components:  ?    Result Value  ? BUN <5 (*)   ? All other components within normal limits  ?CBC - Abnormal; Notable for the following components:  ? MCV 103.6 (*)   ? All other components within normal limits  ?URINALYSIS, ROUTINE W REFLEX MICROSCOPIC - Abnormal; Notable for the following components:  ? APPearance CLOUDY (*)   ? Leukocytes,Ua LARGE (*)   ? Bacteria, UA FEW (*)   ? All other components within normal limits  ?SALICYLATE LEVEL - Abnormal; Notable for the following components:  ? Salicylate Lvl <9.3 (*)   ? All other components within normal limits  ?ACETAMINOPHEN LEVEL - Abnormal; Notable for the following components:  ? Acetaminophen (Tylenol), Serum <10 (*)   ? All other components within normal limits  ?CK  ?ETHANOL  ?LACTIC ACID, PLASMA  ?CBG MONITORING, ED  ?I-STAT BETA HCG BLOOD, ED ([facility], [facility], [facility] ONLY)  ? ? ?EKG ?None ? ?Radiology ?No results found. ? ?Procedures ?Procedures  ? ? ?Medications Ordered in ED ?Medications  ?levETIRAcetam (KEPPRA) IVPB 1000 mg/100  mL premix (0 mg Intravenous Stopped 10/06/21 1551)  ? ? ?ED Course/ Medical Decision Making/ A&P ?Clinical Course as of 10/06/21 2320  ?Thu Oct 06, 2021  ?1540 Case discussed with attending who also evaluated patient and agreeable with treatment plan of loading dose of keppra and observation in the ED.  [SB]  ?1618 Pt re-evaluated and noted that she is feeling well at this time. Mother at bedside and denies patient with any seizure-like activity since being in the ED. Discussed discharge treatment plan. Pt appears safe for discharge. [SB]  ?  ?Clinical Course User Index ?[SB] Lucas Exline A,  PA-C  ? ?                        ?Medical Decision Making ?Amount and/or Complexity of Data Reviewed ?Labs: ordered. ? ?Risk ?Prescription drug management. ? ? ?Pt presents with 2 witnessed seizures onset this morning PTA. Pt with history of seizures and followed by [facility] with last visit being in 2021. Pt with 2 missed days of Keppra. Vitals stable, pt afebrile. On exam, patient without acute cardiovascular, respiratory, or abdominal exam findings. Pt with residual right sided weakness status post SAH and ACA aneurysm requiring craniotomy with clipping in 04/2018. Otherwise, no focal neurological deficits on exam. Differential diagnosis includes seizure-like activity, acute cystitis, hypoglycemia, ETOH, infectious etiology.  ? ?Additional history obtained:  ?Additional history obtained from family members ?External records from outside source obtained and reviewed including: Patient is followed by Neurologist with her last visit being in 2021 where she was advised to follow up in 1 year for an appointment.  ? ?Labs:  ?I ordered, and personally interpreted labs.  The pertinent results include:   ?I-stat beta hCG negative.  ?CBG at 92 and unremarkable.  ?CK at 107 and unremarkable. ?Ethanol <10 and undetectable. ?Acetaminophen <10 ?BMP unremarkable. ?CBC unremarkable. ?Lactic acid at 0.7 and unremarkable. ?Salicylate  level <7 and unremarkable. ?Urinalysis with leukocytes and nitrate negative ? ? ?Medications:  ?I ordered medication including loading dose of Keppra for symptom management. ?Reevaluation of the patient after the

## 2021-10-06 NOTE — ED Provider Triage Note (Signed)
Emergency Medicine Provider Triage Evaluation Note ? ?[name] , a 36 y.o. female  was evaluated in triage.  Pt complains of 2 seizures that occurred earlier this morning.  Patient has history of seizures with her most recent seizure being in July of last year.  Patient does complain of slightly increased weakness in her right leg at this time.  Patient has history of stroke and subarachnoid hemorrhage that resulted in mild right-sided deficits.  Patient also endorses headache and urinary incontinence. Denies chest pain, shortness of breath ? ?Review of Systems  ?Positive: Seizure, headache ?Negative: Chest pain, shortness of breath ? ?Physical Exam  ?BP (!) 115/104 (BP Location: Right Arm)   Pulse 88   Temp 98.3 ?F (36.8 ?C) (Oral)   Resp 18   SpO2 99%  ?Gen:   Awake, no distress   ?Resp:  Normal effort  ?MSK:   Moves extremities without difficulty  ?Other:   ? ?Medical Decision Making  ?Medically screening exam initiated at 1:01 PM.  Appropriate orders placed.  [name] [name] was informed that the remainder of the evaluation will be completed by another provider, this initial triage assessment does not replace that evaluation, and the importance of remaining in the ED until their evaluation is complete. ? ? ?  ?Dorothyann Peng, PA-C ?10/06/21 1307 ? ?

## 2021-10-17 NOTE — Progress Notes (Signed)
? ? ?PATIENT: [name] [name] ?DOB: 1985/12/03 ? ?REASON FOR VISIT: follow up ?HISTORY FROM: patient ? ?Chief Complaint  ?Patient presents with  ? Follow-up  ?  Rm 1, w mother. Here for yearly sz f/u. Pt reports 2 sz, 2 weeks ago. Pt reports missing 2 doses before her sz.   ?  ?HISTORY OF PRESENT ILLNESS: ? ?10/18/21 ALL: ?[name] [name] returns for follow up for seizures. She was last seen 10/2019 and doing well on levetiracetam '1000mg'$  BID. She was seen in the ED 10/06/2021 with reported seizures following 2 days of missed AED. Per her boyfriend's report she had two tonic clonic seizure in her sleep lasting approx 3 minutes each. First around 5am. She was able to get up with oldest daughter and get her ready for school. She laid back down with youngest daughter and had another seizure around 11am. She lost bladder control with first seizure. She was having a hard time walking to the car after the second seizure. Her aunt had to help her. Her mother presents with her, today, and tells me that [name] [name] had a conversation with her in the hospital that she was only taking levetiracetam once daily. [name] [name] doesn't remember the conversation but states that she may have taken once daily for a few days as she was out of the medication. She does admit that she is not as consistent with timing of dosing as she should be. She enjoys staying up late with her kids and probably not getting enough sleep. She drinks mostly coffee and sodas. She is try to cut back on smoking cigarettes. No drugs or alcohol use.  ? ?Review of Epic shows she may have had more seizures than mentioned since last visit 10/2019. She was last seen by rehab 08/2020. She is walking about a mile every day. Still has some right sided weakness and expressive aphasia. BP seems well controlled on amlodipine.  ? ?11/04/2019 ALL:  ?[name] [name] is a 36 y.o. female here today for follow up for seizures. She continues levetiracetam '1000mg'$  twice daily. She called with  concerns of seizure activity in her sleep in 09/2019 after missing evening dose of levetiracetam. She does admit to occasional missed doses but does not feel this occurs very often. She is feeling well today. She is tolerating medication well.  ? ?She continues close follow up with PCP for stroke prevention. She denies any concerns of stroke like symptoms. BP is normal.  ? ?HISTORY: (copied from my note on 05/07/2019) ? ?[name] [name] is a 36 y.o. 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 In 10/2018, patient suffered a seizure in her sleep. She was started on levetiracetam '500mg'$  twice daily. She has tolerated medication without obvious adverse effects. Unfortunately, she had a breakthrough seizure on 10/16. ER workup concerning for UTI. Dr Leta Baptist increased levetiracetam to '1000mg'$  twice daily. She is tolerating dose increase well and denies seizure activity. She takes medication daily at 10am and 10pm. No missed doses. She is followed closely by PCP. Blood pressures have been normal. No stroke like symptoms. She has residual right sided weakness that is improving with time. She does not drive. She has two small children. She lives with her significant other. She has a great support system. Her mother accompanies her today.  ?  ?HISTORY: (copied from  Dr Gladstone Lighter note on 01/01/2019) ?  ?36 year old female here for evaluation of seizure. ?  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  This was treated with craniotomy and clipping surgery procedure.  Hospital course was complicated with left ACA ischemic infarct.  Patient had resultant right-sided weakness.  Patient then  transition to inpatient rehabilitation. ?  ?11/09/2018 patient had seizure in her sleep and went to the emergency room.  Apparently patient was having convulsions.  She was slightly confused after waking up.  CT and CTA scans were performed which showed no acute findings.  Patient was started on levetiracetam 5 mg twice a day and discharged home. ?  ?Since that time no further seizures.  No side effects of medication. ? ? ?REVIEW OF SYSTEMS: Out of a complete 14 system review of symptoms, the patient complains only of the following symptoms, seizures, right sided weakness  and all other reviewed systems are negative. ? ? ?ALLERGIES: ?Allergies  ?Allergen Reactions  ? Sulfa Antibiotics Swelling  ?  Causes swelling on the face.  ? Shellfish Allergy Rash and Other (See Comments)  ?  Stomach upset  ? ? ?HOME MEDICATIONS: ?Outpatient Medications Prior to Visit  ?Medication Sig Dispense Refill  ? acetaminophen (TYLENOL) 325 MG tablet Take 1-2 tablets (325-650 mg total) by mouth every 4 (four) hours as needed for mild pain. (Patient taking differently: Take 325 mg by mouth daily as needed for mild pain (pain).)    ? amLODipine (NORVASC) 10 MG tablet Take 1 tablet (10 mg total) by mouth daily. (Patient taking differently: Take 10 mg by mouth at bedtime.) 90 tablet 1  ? EQ ALLERGY RELIEF, CETIRIZINE, 10 MG tablet Take 1 tablet by mouth once daily (Patient taking differently: Take 10 mg by mouth at bedtime.) 90 tablet 1  ? fluticasone (FLONASE) 50 MCG/ACT nasal spray Place 1 spray into both nostrils daily. (Patient taking differently: Place 1 spray into both nostrils at bedtime.) 16 g 6  ? montelukast (SINGULAIR) 10 MG tablet Take 1 tablet (10 mg total) by mouth at bedtime. 90 tablet 1  ? Multiple Vitamin (MULTIVITAMIN WITH MINERALS) TABS tablet Take 1 tablet by mouth daily. (Patient taking differently: Take 1 tablet by mouth at bedtime.)    ? TRIAMCINOLONE ACETONIDE EX Apply 1 application. topically daily as needed (eczema  flare).    ? levETIRAcetam (KEPPRA) 1000 MG tablet Take 1 tablet (1,000 mg total) by mouth 2 (two) times daily. 60 tablet 0  ? ?No facility-administered medications prior to visit.  ? ? ?PAST MEDICAL HISTORY: ?Past Medical History:  ?Diagnosis Date  ? Asthma, allergic   ? uses inhaler prn - infrequently  ? Bilateral thoracic back pain 09/13/2015  ? Eczema   ? Hypertension   ? Lactose intolerance   ? Mature cystic teratoma of ovary   ? Mood swings 06/01/2015  ? Seasonal allergies   ? Seizures ([place])   ? Stroke [facility]) 04/2018  ? during aneurysm surgery  ? ? ?PAST SURGICAL HISTORY: ?Past Surgical History:  ?Procedure Laterality Date  ? CRANIOTOMY N/A 05/10/2018  ? Procedure: 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;  Surgeon: Consuella Lose, MD;  Location: [place];  Service: Neurosurgery;  Laterality: N/A;  ? IR 3D INDEPENDENT WKST  05/10/2018  ? IR ANGIO INTRA EXTRACRAN SEL INTERNAL CAROTID BILAT MOD SED  05/10/2018  ? IR ANGIO INTRA EXTRACRAN SEL INTERNAL CAROTID BILAT MOD SED  05/30/2019  ? IR ANGIO VERTEBRAL SEL VERTEBRAL BILAT MOD SED  05/10/2018  ? IR ANGIO VERTEBRAL SEL VERTEBRAL UNI R MOD SED  05/30/2019  ? IR US GUIDE VASC ACCESS RIGHT  05/30/2019  ? RADIOLOGY WITH ANESTHESIA N/A 05/10/2018  ? Procedure: EMBOLIZATION OF ANEURSYM;  Surgeon: Consuella Lose, MD;  Location: [place];  Service: Radiology;  Laterality: N/A;  ? TUBAL LIGATION N/A 12/15/2016  ? Procedure: POST PARTUM TUBAL LIGATION;  Surgeon: Mora Bellman, MD;  Location: [facility];  Service: Gynecology;  Laterality: N/A;  ? WISDOM TOOTH EXTRACTION    ? ? ?FAMILY HISTORY: ?Family History  ?Adopted: Yes  ?Problem Relation Age of Onset  ? Hypertension Father   ? Hypertension Maternal Aunt   ? Hypertension Maternal Uncle   ? Hypertension Maternal Grandmother   ? Eczema Maternal Grandmother   ? Hypertension Maternal Grandfather   ? Hypertension Paternal Grandmother   ? Hypertension Mother   ? Eczema Mother   ? ? ?SOCIAL  HISTORY: ?Social History  ? ?Socioeconomic History  ? Marital status: Single  ?  Spouse name: Not on file  ? Number of children: 2  ? Years of education: Not on file  ? Highest education level: Some college, no degree

## 2021-10-17 NOTE — Patient Instructions (Signed)
Below is our plan: ? ?We will switch you to an extended release dose of levetiracetam. Take four '500mg'$  tablets of levetiracetam ER every night. Please work on limiting caffeine, getting plenty of sleep and consider smoking cessation.  ? ?Please make sure you are consistent with timing of seizure medication. I recommend annual visit with primary care provider (PCP) for complete physical and routine blood work. We will monitor vitamin D level. I recommend daily intake of vitamin D (400-800iu) and calcium (800-'1000mg'$ ) for bone health. Discuss Dexa screening with PCP.  ? ?According to [place] law, you can not drive unless you are seizure / syncope free for at least 6 months and under physician's care. ? ?Please maintain precautions. Do not participate in activities where a loss of awareness could harm you or someone else. No swimming alone, no tub bathing, no hot tubs, no driving, no operating motorized vehicles (cars, ATVs, motocycles, etc), lawnmowers, power tools or firearms. No standing at heights, such as rooftops, ladders or stairs. Avoid hot objects such as stoves, heaters, open fires. Wear a helmet when riding a bicycle, scooter, skateboard, etc. and avoid areas of traffic. Set your water heater to 120 degrees or less.  ? ?Please make sure you are staying well hydrated. I recommend 50-60 ounces daily. Well balanced diet and regular exercise encouraged. Consistent sleep schedule with 6-8 hours recommended.  ? ?Please continue follow up with care team as directed.  ? ?Follow up with me in 6 months  ? ?You may receive a survey regarding today's visit. I encourage you to leave honest feed back as I do use this information to improve patient care. Thank you for seeing me today!  ? ? ?

## 2021-10-18 ENCOUNTER — Ambulatory Visit (INDEPENDENT_AMBULATORY_CARE_PROVIDER_SITE_OTHER): Payer: Medicare Other | Admitting: Family Medicine

## 2021-10-18 ENCOUNTER — Encounter: Payer: Self-pay | Admitting: Family Medicine

## 2021-10-18 VITALS — BP 114/70 | HR 65 | Ht 67.0 in | Wt 169.5 lb

## 2021-10-18 DIAGNOSIS — G40909 Epilepsy, unspecified, not intractable, without status epilepticus: Secondary | ICD-10-CM

## 2021-10-18 MED ORDER — LEVETIRACETAM ER 500 MG PO TB24: 2000.0000 mg | ORAL_TABLET | Freq: Every day | ORAL | 3 refills | Status: DC

## 2021-12-12 ENCOUNTER — Telehealth: Payer: Self-pay

## 2022-02-02 ENCOUNTER — Telehealth: Payer: Self-pay

## 2022-02-02 NOTE — Telephone Encounter (Signed)
LVM to return phone call. Needs to schedule medicare wellness exam.

## 2022-02-22 ENCOUNTER — Other Ambulatory Visit: Payer: Self-pay | Admitting: Family Medicine

## 2022-02-22 DIAGNOSIS — I10 Essential (primary) hypertension: Secondary | ICD-10-CM

## 2022-04-11 ENCOUNTER — Other Ambulatory Visit: Payer: Self-pay | Admitting: Family Medicine

## 2022-04-11 DIAGNOSIS — J309 Allergic rhinitis, unspecified: Secondary | ICD-10-CM

## 2022-04-11 DIAGNOSIS — I10 Essential (primary) hypertension: Secondary | ICD-10-CM

## 2022-04-19 NOTE — Patient Instructions (Signed)
Below is our plan:  We will continue levetiracetam XR '2000mg'$  (4 tablets) daily. Please do not miss doses of your mediation.   Please make sure you are consistent with timing of seizure medication. I recommend annual visit with primary care provider (PCP) for complete physical and routine blood work. I recommend daily intake of vitamin D (400-800iu) and calcium (800-'1000mg'$ ) for bone health. Discuss Dexa screening with PCP.   According to [place] law, you can not drive unless you are seizure / syncope free for at least 6 months and under physician's care.  Please maintain precautions. Do not participate in activities where a loss of awareness could harm you or someone else. No swimming alone, no tub bathing, no hot tubs, no driving, no operating motorized vehicles (cars, ATVs, motocycles, etc), lawnmowers, power tools or firearms. No standing at heights, such as rooftops, ladders or stairs. Avoid hot objects such as stoves, heaters, open fires. Wear a helmet when riding a bicycle, scooter, skateboard, etc. and avoid areas of traffic. Set your water heater to 120 degrees or less.  Please make sure you are staying well hydrated. I recommend 50-60 ounces daily. Well balanced diet and regular exercise encouraged. Consistent sleep schedule with 6-8 hours recommended.   Please continue follow up with care team as directed.   Follow up with me in 1 year   You may receive a survey regarding today's visit. I encourage you to leave honest feed back as I do use this information to improve patient care. Thank you for seeing me today!

## 2022-04-19 NOTE — Progress Notes (Signed)
PATIENT: [name] [name] DOB: 09-Jan-1986  REASON FOR VISIT: follow up HISTORY FROM: patient  Virtual Visit via Telephone Note  I connected with [facility] on 04/24/22 at 11:00 AM EST by telephone and verified that I am speaking with the correct person using two identifiers.   I discussed the limitations, risks, security and privacy concerns of performing an evaluation and management service by telephone and the availability of in person appointments. I also discussed with the patient that there may be a patient responsible charge related to this service. The patient expressed understanding and agreed to proceed.   History of Present Illness:  04/24/22 ALL: [name] [name] is a 36 y.o. female here today for follow up seizures. She continues levetiracetam XR 2085m QD. She reports doing very well. No seizure activity. No missed doses of ASM. She is tolerating medication well. She is followed regularly by PCP. She is feeling well and denies concerns, today.   10/18/21 ALL: KSenetrareturns for follow up for seizures. She was last seen 10/2019 and doing well on levetiracetam 10099mBID. She was seen in the ED 10/06/2021 with reported seizures following 2 days of missed AED. Per her boyfriend's report she had two tonic clonic seizure in her sleep lasting approx 3 minutes each. First around 5am. She was able to get up with oldest daughter and get her ready for school. She laid back down with youngest daughter and had another seizure around 11am. She lost bladder control with first seizure. She was having a hard time walking to the car after the second seizure. Her aunt had to help her. Her mother presents with her, today, and tells me that [name] [name] a conversation with her in the hospital that she was only taking levetiracetam once daily. [name] [name] remember the conversation but states that she may have taken once daily for a few days as she was out of the medication. She does admit that  she is not as consistent with timing of dosing as she should be. She enjoys staying up late with her kids and probably not getting enough sleep. She drinks mostly coffee and sodas. She is try to cut back on smoking cigarettes. No drugs or alcohol use.   Review of Epic shows she may have had more seizures than mentioned since last visit 10/2019. She was last seen by rehab 08/2020. She is walking about a mile every day. Still has some right sided weakness and expressive aphasia. BP seems well controlled on amlodipine.   11/04/2019 ALL:  [name] [name] a [age].o. female here today for follow up for seizures. She continues levetiracetam 100073mwice daily. She called with concerns of seizure activity in her sleep in 09/2019 after missing evening dose of levetiracetam. She does admit to occasional missed doses but does not feel this occurs very often. She is feeling well today. She is tolerating medication well.   She continues close follow up with PCP for stroke prevention. She denies any concerns of stroke like symptoms. BP is normal.    Observations/Objective:  Generalized: Well developed, in no acute distress  Mentation: Alert oriented to time, place, history taking. Follows all commands speech and language fluent   Assessment and Plan:  [age]o. year old female  has a past medical history of Asthma, allergic, Bilateral thoracic back pain (09/13/2015), Eczema, Hypertension, Lactose intolerance, Mature cystic teratoma of ovary, Mood swings (06/01/2015), Seasonal allergies, [place] here with    ICD-10-CM  1. Seizure disorder ([place])  G40.909      [name] [name] is doing well. No seizure activity and no missed doses of ASM since last visit. Last seizure 09/2021. She will continue levetiraetam XR 2069m daily. [place] lifestyle habits encouraged. Seizure precautions reviewed. She will follow up with me in 1 year, sooner if needed.    No orders of the defined types were  placed in this encounter.   Meds ordered this encounter  Medications   levETIRAcetam (KEPPRA XR) 500 MG 24 hr tablet    Sig: Take 4 tablets (2,000 mg total) by mouth daily.    Dispense:  360 tablet    Refill:  3    Order Specific Question:   Supervising Provider    Answer:   AMelvenia Beam[[id number]    Follow Up Instructions:  I discussed the assessment and treatment plan with the patient. The patient was provided an opportunity to ask questions and all were answered. The patient agreed with the plan and demonstrated an understanding of the instructions.   The patient was advised to call back or seek an in-person evaluation if the symptoms worsen or if the condition fails to improve as anticipated.  I provided 15 minutes of non-face-to-face time during this encounter. Patient located at their place of residence during MAcequiavisit. Provider is in the office.    ADebbora Presto NP

## 2022-04-24 ENCOUNTER — Encounter: Payer: Self-pay | Admitting: Family Medicine

## 2022-04-24 ENCOUNTER — Telehealth (INDEPENDENT_AMBULATORY_CARE_PROVIDER_SITE_OTHER): Payer: Medicare Other | Admitting: Family Medicine

## 2022-04-24 DIAGNOSIS — G40909 Epilepsy, unspecified, not intractable, without status epilepticus: Secondary | ICD-10-CM | POA: Diagnosis not present

## 2022-04-24 MED ORDER — LEVETIRACETAM ER 500 MG PO TB24: 2000.0000 mg | ORAL_TABLET | Freq: Every day | ORAL | 3 refills | Status: DC

## 2022-06-05 ENCOUNTER — Ambulatory Visit: Payer: Medicare Other | Attending: Family Medicine | Admitting: Family Medicine

## 2022-06-05 ENCOUNTER — Encounter: Payer: Self-pay | Admitting: Family Medicine

## 2022-06-05 VITALS — BP 130/80 | HR 74 | Temp 98.0°F | Ht 67.0 in | Wt 182.0 lb

## 2022-06-05 DIAGNOSIS — Z8673 Personal history of transient ischemic attack (TIA), and cerebral infarction without residual deficits: Secondary | ICD-10-CM | POA: Diagnosis not present

## 2022-06-05 DIAGNOSIS — R4701 Aphasia: Secondary | ICD-10-CM | POA: Diagnosis not present

## 2022-06-05 DIAGNOSIS — Z8679 Personal history of other diseases of the circulatory system: Secondary | ICD-10-CM | POA: Insufficient documentation

## 2022-06-05 DIAGNOSIS — Z716 Tobacco abuse counseling: Secondary | ICD-10-CM | POA: Insufficient documentation

## 2022-06-05 DIAGNOSIS — Z86718 Personal history of other venous thrombosis and embolism: Secondary | ICD-10-CM | POA: Diagnosis not present

## 2022-06-05 DIAGNOSIS — Z79899 Other long term (current) drug therapy: Secondary | ICD-10-CM | POA: Diagnosis not present

## 2022-06-05 DIAGNOSIS — G40909 Epilepsy, unspecified, not intractable, without status epilepticus: Secondary | ICD-10-CM | POA: Diagnosis not present

## 2022-06-05 DIAGNOSIS — J309 Allergic rhinitis, unspecified: Secondary | ICD-10-CM | POA: Insufficient documentation

## 2022-06-05 DIAGNOSIS — Z720 Tobacco use: Secondary | ICD-10-CM

## 2022-06-05 DIAGNOSIS — I10 Essential (primary) hypertension: Secondary | ICD-10-CM | POA: Insufficient documentation

## 2022-06-05 DIAGNOSIS — F17210 Nicotine dependence, cigarettes, uncomplicated: Secondary | ICD-10-CM | POA: Insufficient documentation

## 2022-06-05 MED ORDER — MONTELUKAST SODIUM 10 MG PO TABS: 10.0000 mg | ORAL_TABLET | Freq: Every day | ORAL | 1 refills | Status: DC

## 2022-06-05 MED ORDER — AMLODIPINE BESYLATE 10 MG PO TABS: 10.0000 mg | ORAL_TABLET | Freq: Every day | ORAL | 1 refills | Status: DC

## 2022-06-05 NOTE — Patient Instructions (Signed)
Exercising to Stay Healthy To become healthy and stay healthy, it is recommended that you do moderate-intensity and vigorous-intensity exercise. You can tell that you are exercising at a moderate intensity if your heart starts beating faster and you start breathing faster but can still hold a conversation. You can tell that you are exercising at a vigorous intensity if you are breathing much harder and faster and cannot hold a conversation while exercising. How can exercise benefit me? Exercising regularly is important. It has many health benefits, such as: Improving overall fitness, flexibility, and endurance. Increasing bone density. Helping with weight control. Decreasing body fat. Increasing muscle strength and endurance. Reducing stress and tension, anxiety, depression, or anger. Improving overall health. What guidelines should I follow while exercising? Before you start a new exercise program, talk with your health care provider. Do not exercise so much that you hurt yourself, feel dizzy, or get very short of breath. Wear comfortable clothes and wear shoes with good support. Drink plenty of water while you exercise to prevent dehydration or heat stroke. Work out until your breathing and your heartbeat get faster (moderate intensity). How often should I exercise? Choose an activity that you enjoy, and set realistic goals. Your health care provider can help you make an activity plan that is individually designed and works best for you. Exercise regularly as told by your health care provider. This may include: Doing strength training two times a week, such as: Lifting weights. Using resistance bands. Push-ups. Sit-ups. Yoga. Doing a certain intensity of exercise for a given amount of time. Choose from these options: A total of 150 minutes of moderate-intensity exercise every week. A total of 75 minutes of vigorous-intensity exercise every week. A mix of moderate-intensity and  vigorous-intensity exercise every week. Children, pregnant women, people who have not exercised regularly, people who are overweight, and older adults may need to talk with a health care provider about what activities are safe to perform. If you have a medical condition, be sure to talk with your health care provider before you start a new exercise program. What are some exercise ideas? Moderate-intensity exercise ideas include: Walking 1 mile (1.6 km) in about 15 minutes. Biking. Hiking. Golfing. Dancing. Water aerobics. Vigorous-intensity exercise ideas include: Walking 4.5 miles (7.2 km) or more in about 1 hour. Jogging or running 5 miles (8 km) in about 1 hour. Biking 10 miles (16.1 km) or more in about 1 hour. Lap swimming. Roller-skating or in-line skating. Cross-country skiing. Vigorous competitive sports, such as football, basketball, and soccer. Jumping rope. Aerobic dancing. What are some everyday activities that can help me get exercise? Yard work, such as: Pushing a lawn mower. Raking and bagging leaves. Washing your car. Pushing a stroller. Shoveling snow. Gardening. Washing windows or floors. How can I be more active in my day-to-day activities? Use stairs instead of an elevator. Take a walk during your lunch break. If you drive, park your car farther away from your work or school. If you take public transportation, get off one stop early and walk the rest of the way. Stand up or walk around during all of your indoor phone calls. Get up, stretch, and walk around every 30 minutes throughout the day. Enjoy exercise with a friend. Support to continue exercising will help you keep a regular routine of activity. Where to find more information You can find more information about exercising to stay healthy from: U.S. Department of Health and Human Services: www.hhs.gov Centers for Disease Control and Prevention (  CDC): www.cdc.gov Summary Exercising regularly is  important. It will improve your overall fitness, flexibility, and endurance. Regular exercise will also improve your overall health. It can help you control your weight, reduce stress, and improve your bone density. Do not exercise so much that you hurt yourself, feel dizzy, or get very short of breath. Before you start a new exercise program, talk with your health care provider. This information is not intended to replace advice given to you by your health care provider. Make sure you discuss any questions you have with your health care provider. Document Revised: 10/01/2020 Document Reviewed: 10/01/2020 Elsevier Patient Education  2023 [employer]  

## 2022-06-05 NOTE — Progress Notes (Signed)
 Subjective:  Patient ID: [name] [name], female    DOB: 09/20/1985  Age: 36 y.o. MRN: [id number]  CC: Seizures   HPI [name] [name] is a 36 y.o. 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 She remains on Keppra for seizure prophylaxis and reports doing well.  Last visit [facility] was a virtual visit last month. Asthma/acute rhinitis are stable on Singulair. She denies presence of additional concerns today.  He continues to smoke cigarettes and is not ready to quit.  States she has lost 3 family members in the last few months and is working as a method of coping. Past Medical History:  Diagnosis Date   Asthma, allergic    uses inhaler prn - infrequently   Bilateral thoracic back pain 09/13/2015   Eczema    Hypertension    Lactose intolerance    Mature cystic teratoma of ovary    Mood swings 06/01/2015   Seasonal allergies    Seizures (HCC)    Stroke (HCC) 04/2018   during aneurysm surgery    Past Surgical History:  Procedure Laterality Date   CRANIOTOMY N/A 05/10/2018   Procedure: 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;  Surgeon: Nundkumar, Neelesh, MD;  Location: [facility] OR;  Service: Neurosurgery;  Laterality: N/A;   IR 3D INDEPENDENT WKST  05/10/2018   IR ANGIO INTRA EXTRACRAN SEL INTERNAL CAROTID BILAT MOD SED  05/10/2018   IR ANGIO INTRA EXTRACRAN SEL INTERNAL CAROTID BILAT MOD SED  05/30/2019   IR ANGIO VERTEBRAL SEL VERTEBRAL BILAT MOD SED  05/10/2018   IR ANGIO VERTEBRAL SEL VERTEBRAL UNI R MOD SED   05/30/2019   IR US GUIDE VASC ACCESS RIGHT  05/30/2019   RADIOLOGY WITH ANESTHESIA N/A 05/10/2018   Procedure: EMBOLIZATION OF ANEURSYM;  Surgeon: Nundkumar, Neelesh, MD;  Location: [facility] OR;  Service: Radiology;  Laterality: N/A;   TUBAL LIGATION N/A 12/15/2016   Procedure: POST PARTUM TUBAL LIGATION;  Surgeon: Constant, Peggy, MD;  Location: [facility];  Service: Gynecology;  Laterality: N/A;   WISDOM TOOTH EXTRACTION      Family History  Adopted: Yes  Problem Relation Age of Onset   Hypertension Father    Hypertension Maternal Aunt    Hypertension Maternal Uncle    Hypertension Maternal Grandmother    Eczema Maternal Grandmother    Hypertension Maternal Grandfather    Hypertension Paternal Grandmother    Hypertension Mother    Eczema Mother     Social History   Socioeconomic History   Marital status: Single    Spouse name: Not on file   Number of children: 2   Years of education: Not on file   Highest education level: Some college, no degree  Occupational History   Not on file  Tobacco Use   Smoking status: Every Day    Packs/day: 0.25    Types: Cigarettes   Smokeless tobacco: Former  Vaping Use   Vaping Use: Never used  Substance and Sexual Activity   Alcohol use: No   Drug use: Not Currently    Types: Marijuana    Comment: used to yrs ago   Sexual activity:   Not Currently    Birth control/protection: None  Other Topics Concern   Not on file  Social History Narrative   Lives with 2 children   Coffee 2 cups daily   Social Determinants of Health   Financial Resource Strain: Not on file  Food Insecurity: Not on file  Transportation Needs: Not on file  Physical Activity: Not on file  Stress: Not on file  Social Connections: Not on file    Allergies  Allergen Reactions   Sulfa Antibiotics Swelling    Causes swelling on the face.   Shellfish Allergy Rash and Other (See Comments)    Stomach upset    Outpatient Medications Prior to Visit  Medication Sig  Dispense Refill   acetaminophen (TYLENOL) 325 MG tablet Take 1-2 tablets (325-650 mg total) by mouth every 4 (four) hours as needed for mild pain. (Patient taking differently: Take 325 mg by mouth daily as needed for mild pain (pain).)     EQ ALLERGY RELIEF, CETIRIZINE, 10 MG tablet Take 1 tablet by mouth once daily (Patient taking differently: Take 10 mg by mouth at bedtime.) 90 tablet 1   fluticasone (FLONASE) 50 MCG/ACT nasal spray Place 1 spray into both nostrils daily. (Patient taking differently: Place 1 spray into both nostrils at bedtime.) 16 g 6   levETIRAcetam (KEPPRA XR) 500 MG 24 hr tablet Take 4 tablets (2,000 mg total) by mouth daily. 360 tablet 3   Multiple Vitamin (MULTIVITAMIN WITH MINERALS) TABS tablet Take 1 tablet by mouth daily. (Patient taking differently: Take 1 tablet by mouth at bedtime.)     TRIAMCINOLONE ACETONIDE EX Apply 1 application. topically daily as needed (eczema flare).     amLODipine (NORVASC) 10 MG tablet Take 1 tablet (10 mg total) by mouth daily. 90 tablet 0   montelukast (SINGULAIR) 10 MG tablet TAKE 1 TABLET BY MOUTH AT BEDTIME 90 tablet 0   No facility-administered medications prior to visit.     ROS Review of Systems  Constitutional:  Negative for activity change and appetite change.  HENT:  Negative for sinus pressure and sore throat.   Respiratory:  Negative for chest tightness, shortness of breath and wheezing.   Cardiovascular:  Negative for chest pain and palpitations.  Gastrointestinal:  Negative for abdominal distention, abdominal pain and constipation.  Genitourinary: Negative.   Musculoskeletal: Negative.   Psychiatric/Behavioral:  Negative for behavioral problems and dysphoric mood.     Objective:  BP 130/80   Pulse 74   Temp 98 F (36.7 C) (Oral)   Ht 5' 7" (1.702 m)   Wt 182 lb (82.6 kg)   SpO2 100%   BMI 28.51 kg/m      06/05/2022    4:03 PM 06/05/2022    3:46 PM 10/18/2021   10:47 AM  BP/Weight  Systolic BP 130 131 114   Diastolic BP 80 90 70  Wt. (Lbs)  182 169.5  BMI  28.51 kg/m2 26.55 kg/m2      Physical Exam Constitutional:      Appearance: She is well-developed.  Cardiovascular:     Rate and Rhythm: Normal rate.     Heart sounds: Normal heart sounds. No murmur heard. Pulmonary:     Effort: Pulmonary effort is normal.     Breath sounds: Normal breath sounds. No wheezing or rales.  Chest:     Chest wall: No tenderness.  Abdominal:     General: Bowel sounds are normal. There is no distension.     Palpations: Abdomen is   soft. There is no mass.     Tenderness: There is no abdominal tenderness.  Musculoskeletal:        General: Normal range of motion.     Right lower leg: No edema.     Left lower leg: No edema.  Neurological:     Mental Status: She is alert and oriented to person, place, and time.  Psychiatric:        Mood and Affect: Mood normal.        Latest Ref Rng & Units 10/06/2021    1:09 PM 09/05/2021    4:30 PM 01/07/2020    3:35 PM  CMP  Glucose 70 - 99 mg/dL 97  88  81   BUN 6 - 20 mg/dL <_0 Creatinine 0.44 - 1.00 mg/dL 0.65  0.62  0.63   Sodium 135 - 145 mmol/L 140  142  144   Potassium 3.5 - 5.1 mmol/L 4.0  4.3  4.3   Chloride 98 - 111 mmol/L 109  106  108   CO2 22 - 32 mmol/L _1 Calcium 8.9 - 10.3 mg/dL 9.3  9.4  9.7   Total Protein 6.0 - 8.5 g/dL  6.7    Total Bilirubin 0.0 - 1.2 mg/dL  <0.2    Alkaline Phos 44 - 121 IU/L  67    AST 0 - 40 IU/L  17    ALT 0 - 32 IU/L  12      Lipid Panel     Component Value Date/Time   CHOL 168 08/01/2017 0934   TRIG 133 05/13/2018 2230   HDL 53 08/01/2017 0934   CHOLHDL 3.2 08/01/2017 0934   CHOLHDL 4.4 07/02/2015 0945   VLDL 12 07/02/2015 0945   LDLCALC 105 (H) 08/01/2017 0934    CBC    Component Value Date/Time   WBC 5.3 10/06/2021 1309   RBC 3.88 10/06/2021 1309   HGB 13.0 10/06/2021 1309   HGB 11.0 (L) 09/25/2016 0902   HCT 40.2 10/06/2021 1309   HCT 33.9 (L) 09/25/2016 0902   PLT 272  10/06/2021 1309   PLT 198 09/25/2016 0902   MCV 103.6 (H) 10/06/2021 1309   MCV 98 (H) 09/25/2016 0902   MCH 33.5 10/06/2021 1309   MCHC 32.3 10/06/2021 1309   RDW 12.6 10/06/2021 1309   RDW 12.9 09/25/2016 0902   LYMPHSABS 2.2 05/30/2019 0722   MONOABS 0.2 05/30/2019 0722   EOSABS 0.1 05/30/2019 0722   BASOSABS 0.0 05/30/2019 0722    Lab Results  Component Value Date   HGBA1C 4.3 (L) 05/28/2018    Assessment & Plan:  1. Essential hypertension Controlled Continue current regimen Counseled on blood pressure goal of less than 130/80, low-sodium, DASH diet, medication compliance, 150 minutes of moderate intensity exercise per week. Discussed medication compliance, adverse effects. - amLODipine (NORVASC) 10 MG tablet; Take 1 tablet (10 mg total) by mouth daily.  Dispense: 90 tablet; Refill: 1 - CMP14+EGFR - LP+Non-HDL Cholesterol  2. Tobacco abuse Spent 3 minutes counseling on smoking cessation but she is not ready to quit  3. Allergic rhinitis, unspecified seasonality, unspecified trigger Stable - montelukast (SINGULAIR) 10 MG tablet; Take 1 tablet (10 mg total) by mouth at bedtime.  Dispense: 90 tablet; Refill: 1   Meds ordered this encounter  Medications   amLODipine (NORVASC) 10 MG tablet    Sig: Take 1 tablet (10 mg total) by mouth daily.  Dispense:  90 tablet    Refill:  1   montelukast (SINGULAIR) 10 MG tablet    Sig: Take 1 tablet (10 mg total) by mouth at bedtime.    Dispense:  90 tablet    Refill:  1    Follow-up: Return in about 6 weeks (around 07/17/2022) for Medicare wellness exam, Pap smear.       Enobong [facility], MD, FAAFP. [facility] [place], [place] [phone]   06/05/2022, 5:20 PM 

## 2022-06-06 LAB — LP+NON-HDL CHOLESTEROL
Cholesterol, Total: 166 mg/dL (ref 100–199)
HDL: 61 mg/dL (ref >39)
LDL Chol Calc (NIH): 93 mg/dL (ref 0–99)
Total Non-HDL-Chol (LDL+VLDL): 105 mg/dL (ref 0–129)
Triglycerides: 61 mg/dL (ref 0–149)
VLDL Cholesterol Cal: 12 mg/dL (ref 5–40)

## 2022-06-06 LAB — CMP14+EGFR
ALT: 23 IU/L (ref 0–32)
AST: 20 IU/L (ref 0–40)
Albumin/Globulin Ratio: 1.5 (ref 1.2–2.2)
Albumin: 4.6 g/dL (ref 3.9–4.9)
Alkaline Phosphatase: 71 IU/L (ref 44–121)
BUN/Creatinine Ratio: 6 — ABNORMAL LOW (ref 9–23)
BUN: 4 mg/dL — ABNORMAL LOW (ref 6–20)
Bilirubin Total: 0.2 mg/dL (ref 0.0–1.2)
CO2: 17 mmol/L — ABNORMAL LOW (ref 20–29)
Calcium: 9.4 mg/dL (ref 8.7–10.2)
Chloride: 106 mmol/L (ref 96–106)
Creatinine, Ser: 0.71 mg/dL (ref 0.57–1.00)
Globulin, Total: 3 g/dL (ref 1.5–4.5)
Glucose: 90 mg/dL (ref 70–99)
Potassium: 4.4 mmol/L (ref 3.5–5.2)
Sodium: 146 mmol/L — ABNORMAL HIGH (ref 134–144)
Total Protein: 7.6 g/dL (ref 6.0–8.5)
eGFR: 113 mL/min/{1.73_m2} (ref >59)

## 2022-07-18 ENCOUNTER — Ambulatory Visit: Payer: Medicare Other | Admitting: Family Medicine

## 2022-09-18 ENCOUNTER — Telehealth: Payer: Self-pay | Admitting: Family Medicine

## 2022-09-18 NOTE — Telephone Encounter (Signed)
Called patient to schedule Medicare Annual Wellness Visit (AWV). Left message for patient to call back and schedule Medicare Annual Wellness Visit (AWV).  Last date of AWV: awvi 12/17/20 per [facility]    If any questions, please contact me at [phone].  Thank you ,  [facility] AWV direct phone # [phone]

## 2022-09-25 ENCOUNTER — Ambulatory Visit: Payer: Medicare Other | Admitting: Family Medicine

## 2022-10-11 ENCOUNTER — Ambulatory Visit: Payer: 59 | Attending: Family Medicine | Admitting: Physician Assistant

## 2022-10-11 ENCOUNTER — Other Ambulatory Visit (HOSPITAL_COMMUNITY)
Admission: RE | Admit: 2022-10-11 | Discharge: 2022-10-11 | Disposition: A | Discharge status: Home / Self Care | Payer: 59 | Source: Ambulatory Visit | Attending: Family Medicine | Admitting: Family Medicine

## 2022-10-11 ENCOUNTER — Encounter: Payer: Self-pay | Admitting: Physician Assistant

## 2022-10-11 VITALS — BP 138/97 | HR 57 | Wt 204.4 lb

## 2022-10-11 DIAGNOSIS — Z124 Encounter for screening for malignant neoplasm of cervix: Secondary | ICD-10-CM | POA: Insufficient documentation

## 2022-10-11 DIAGNOSIS — Z1151 Encounter for screening for human papillomavirus (HPV): Secondary | ICD-10-CM | POA: Diagnosis not present

## 2022-10-11 DIAGNOSIS — Z113 Encounter for screening for infections with a predominantly sexual mode of transmission: Secondary | ICD-10-CM

## 2022-10-11 DIAGNOSIS — Z01411 Encounter for gynecological examination (general) (routine) with abnormal findings: Secondary | ICD-10-CM | POA: Diagnosis not present

## 2022-10-11 NOTE — Patient Instructions (Signed)
Pap Test Why am I having this test? A Pap test, also called a Pap smear, is a screening test to check for signs of: Infection. Cancer of the cervix. The cervix is the lower part of the uterus that opens into the vagina. Changes that may be a sign that cancer is developing (precancerous changes). Women need this test on a regular basis. In general, you should have a Pap test every 3 years until you reach menopause or age 37. Women aged 30-60 may choose to have their Pap test done at the same time as an HPV (human papillomavirus) test every 5 years (instead of every 3 years). Your health care provider may recommend having Pap tests more or less often depending on your medical conditions and past Pap test results. What is being tested? Cervical cells are tested for signs of infection or abnormalities. What kind of sample is taken?  Your health care provider will collect a sample of cells from the surface of your cervix. This will be done using a small cotton swab, plastic spatula, or brush that is inserted into your vagina using a tool called a speculum. This sample is often collected during a pelvic exam, when you are lying on your back on an exam table with your feet in footrests (stirrups). In some cases, fluids (secretions) from the cervix or vagina may also be collected. How do I prepare for this test? Be aware of where you are in your menstrual cycle. If you are menstruating on the day of the test, you may be asked to reschedule. You may need to reschedule if you have a known vaginal infection on the day of the test. Follow instructions from your health care provider about: Changing or stopping your regular medicines. Some medicines can cause abnormal test results, such as vaginal medicines and tetracycline. Avoiding douching 2-3 days before or the day of the test. Tell a health care provider about: Any allergies you have. All medicines you are taking, including vitamins, herbs, eye drops,  creams, and over-the-counter medicines. Any bleeding problems you have. Any surgeries you have had. Any medical conditions you have. Whether you are pregnant or may be pregnant. How are the results reported? Your test results will be reported as either abnormal or normal. What do the results mean? A normal test result means that you do not have signs of cancer of the cervix. An abnormal result may mean that you have: Cancer. A Pap test by itself is not enough to diagnose cancer. You will have more tests done if cancer is suspected. Precancerous changes in your cervix. Inflammation of the cervix. An STI (sexually transmitted infection). A fungal infection. A parasite infection. Talk with your health care provider about what your results mean. In some cases, your health care provider may do more testing to confirm the results. Questions to ask your health care provider Ask your health care provider, or the department that is doing the test: When will my results be ready? How will I get my results? What are my treatment options? What other tests do I need? What are my next steps? Summary In general, women should have a Pap test every 3 years until they reach menopause or age 37. Your health care provider will collect a sample of cells from the surface of your cervix. This will be done using a small cotton swab, plastic spatula, or brush. In some cases, fluids (secretions) from the cervix or vagina may also be collected. This information is not   intended to replace advice given to you by your health care provider. Make sure you discuss any questions you have with your health care provider. Document Revised: 09/03/2020 Document Reviewed: 09/03/2020 Elsevier Patient Education  2023 Elsevier Inc.  

## 2022-10-11 NOTE — Progress Notes (Signed)
Patient ID: Janith Lima, female   DOB: Oct 26, 1985, 37 y.o.   MRN: [id number]   Lucille Witts, is a 37 y.o. female  [id number]  [id number]  DOB - 1986/05/21  Chief Complaint  Patient presents with   Gynecologic Exam       Subjective:   Liel Rudden is a 37 y.o. female here today for pap only.  LMP about 1 month ago.  BTL for [facility].  Denies issues or concerns.  She has never had an abnormal pap.  Denies vaginal discharge but does want to send swabs.  No pelvic pain  No problems updated.  ALLERGIES: Allergies  Allergen Reactions   Sulfa Antibiotics Swelling    Causes swelling on the face.   Shellfish Allergy Rash and Other (See Comments)    Stomach upset    PAST MEDICAL HISTORY: Past Medical History:  Diagnosis Date   Asthma, allergic    uses inhaler prn - infrequently   Bilateral thoracic back pain 09/13/2015   Eczema    Hypertension    Lactose intolerance    Mature cystic teratoma of ovary    Mood swings 06/01/2015   Seasonal allergies    Seizures    Stroke 04/2018   during aneurysm surgery    MEDICATIONS AT HOME: Prior to Admission medications   Medication Sig Start Date End Date Taking? Authorizing Provider  acetaminophen (TYLENOL) 325 MG tablet Take 1-2 tablets (325-650 mg total) by mouth every 4 (four) hours as needed for mild pain. Patient taking differently: Take 325 mg by mouth daily as needed for mild pain (pain). 06/24/18  Yes Love, Evlyn Kanner, PA-C  amLODipine (NORVASC) 10 MG tablet Take 1 tablet (10 mg total) by mouth daily. 06/05/22  Yes Hoy Register, MD  EQ ALLERGY RELIEF, CETIRIZINE, 10 MG tablet Take 1 tablet by mouth once daily Patient taking differently: Take 10 mg by mouth at bedtime. 08/07/20  Yes New[facility], Odette Horns, MD  fluticasone (FLONASE) 50 MCG/ACT nasal spray Place 1 spray into both nostrils daily. Patient taking differently: Place 1 spray into both nostrils at bedtime. 09/05/21  Yes Hoy Register, MD  levETIRAcetam (KEPPRA XR)  500 MG 24 hr tablet Take 4 tablets (2,000 mg total) by mouth daily. 04/24/22  Yes Lomax, Amy, NP  montelukast (SINGULAIR) 10 MG tablet Take 1 tablet (10 mg total) by mouth at bedtime. 06/05/22  Yes Hoy Register, MD  Multiple Vitamin (MULTIVITAMIN WITH MINERALS) TABS tablet Take 1 tablet by mouth daily. Patient taking differently: Take 1 tablet by mouth at bedtime. 06/25/18  Yes Love, Evlyn Kanner, PA-C  TRIAMCINOLONE ACETONIDE EX Apply 1 application. topically daily as needed (eczema flare).   Yes [provider]    ROS: Neg HEENT Neg resp Neg cardiac Neg GI Neg GU Neg MS Neg psych Neg neuro  Objective:   Vitals:   10/11/22 1101  BP: (!) 138/97  Pulse: (!) 57  SpO2: 100%  Weight: 204 lb 6.4 oz (92.7 kg)   Exam General appearance : Awake, alert, not in any distress. Speech Clear. Not toxic looking HEENT: Atraumatic and Normocephalic GU:  large bartholins (soft and fluctuant and non-tender)R labia-speculum inserted.  Vaginal mucosa WNL and cervix friable.  Pap and swabs taken,  bimanual unremarkable. Extremities: B/L Lower Ext shows no edema, both legs are warm to touch Neurology: Awake alert, and oriented X 3, CN II-XII intact, Non focal Skin: No Rash  Data Review Lab Results  Component Value Date   HGBA1C 4.3 (L) 05/28/2018   HGBA1C  4.5 06/29/2016   HGBA1C CANCELED 06/22/2016    Assessment & Plan   1. Screening for malignant neoplasm of cervix performed Bartholins vs lipoma-this was seen by gyn and they told her it would only need to be removed if it caused her problems - Cytology - PAP([place])  2. Screening examination for STD (sexually transmitted disease) - Cervicovaginal ancillary only    Return if symptoms worsen or fail to improve.  The patient was given clear instructions to go to ER or return to medical center if symptoms don't improve, worsen or new problems develop. The patient verbalized understanding. The patient was told to call to get lab  results if they haven't heard anything in the next week.      Georgian Co, PA-C [facility] [place], [place] [phone]   10/11/2022, 12:23 PM

## 2022-10-12 ENCOUNTER — Other Ambulatory Visit: Payer: Self-pay | Admitting: Physician Assistant

## 2022-10-12 LAB — CERVICOVAGINAL ANCILLARY ONLY
Bacterial Vaginitis (gardnerella): POSITIVE — AB
Candida Glabrata: NEGATIVE
Candida Vaginitis: NEGATIVE
Chlamydia: NEGATIVE
Comment: NEGATIVE
Comment: NEGATIVE
Comment: NEGATIVE
Comment: NEGATIVE
Comment: NEGATIVE
Comment: NORMAL
Neisseria Gonorrhea: NEGATIVE
Trichomonas: NEGATIVE

## 2022-10-12 MED ORDER — METRONIDAZOLE 500 MG PO TABS: 500.0000 mg | ORAL_TABLET | Freq: Two times a day (BID) | ORAL | 0 refills | Status: DC

## 2022-10-13 ENCOUNTER — Telehealth: Payer: Self-pay

## 2022-10-13 NOTE — Telephone Encounter (Signed)
Pt was called and vm was left, Information has been sent to nurse pool.   

## 2022-10-13 NOTE — Telephone Encounter (Signed)
-----   Message from Guy Franco, RN sent at 10/13/2022  9:43 AM EDT -----  ----- Message ----- From: Anders Simmonds, PA-C Sent: 10/12/2022   3:50 PM EDT To: Guy Franco, RN  Your vaginal swabs showed bacterial vaginosis which is an imbalance in the bacteria in the vagina.  This is NOT an STD.  I sent you a prescription to [facility].  Thanks, Georgian Co, PA-C

## 2022-10-17 ENCOUNTER — Other Ambulatory Visit: Payer: Self-pay | Admitting: Physician Assistant

## 2022-10-17 DIAGNOSIS — R87810 Cervical high risk human papillomavirus (HPV) DNA test positive: Secondary | ICD-10-CM

## 2022-10-19 ENCOUNTER — Telehealth: Payer: Medicare Other | Admitting: Family Medicine

## 2022-10-19 LAB — CYTOLOGY - PAP
Comment: NEGATIVE
Comment: NEGATIVE
Comment: NEGATIVE
Diagnosis: NEGATIVE
HPV 16: NEGATIVE
HPV 18 / 45: NEGATIVE
High risk HPV: POSITIVE — AB

## 2022-10-20 ENCOUNTER — Telehealth: Payer: Self-pay

## 2022-10-20 NOTE — Telephone Encounter (Signed)
-----   Message from Guy Franco, RN sent at 10/19/2022 12:36 PM EDT -----  ----- Message ----- From: Anders Simmonds, PA-C Sent: 10/17/2022  11:21 AM EDT To: Guy Franco, RN  Please call patient.  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  I have referred her to a gynecologist for further assessment.  Thanks, Georgian Co, PA-C

## 2022-10-20 NOTE — Telephone Encounter (Signed)
Pt was called and is aware of results, DOB was confirmed.  ?

## 2022-10-23 ENCOUNTER — Telehealth: Payer: Self-pay | Admitting: Family Medicine

## 2022-10-23 NOTE — Telephone Encounter (Signed)
Contacted [facility] to schedule their annual wellness visit. Appointment made for 10/31/2022.  Thank you,  [facility] Support [facility] Direct dial  [phone]

## 2022-10-31 ENCOUNTER — Ambulatory Visit: Payer: 59 | Attending: Family Medicine

## 2022-10-31 VITALS — Ht 67.0 in | Wt 204.0 lb

## 2022-10-31 DIAGNOSIS — Z0000 Encounter for general adult medical examination without abnormal findings: Secondary | ICD-10-CM

## 2022-10-31 NOTE — Patient Instructions (Addendum)
[name] [name] , Thank you for taking time to come for your Medicare Wellness Visit. I appreciate your ongoing commitment to your health goals. Please review the following plan we discussed and let me know if I can assist you in the future.   These are the goals we discussed:  Goals      Blood Pressure < 140/90     Remain active and manage health        This is a list of the screening recommended for you and due dates:  Health Maintenance  Topic Date Due   COVID-19 Vaccine (3 - 2023-24 season) 02/17/2022   Flu Shot  01/18/2023   Medicare Annual Wellness Visit  10/31/2023   Pap Smear  10/10/2025   DTaP/Tdap/Td vaccine (4 - Td or Tdap) 09/26/2026   Hepatitis C Screening: USPSTF Recommendation to screen - Ages 66-79 yo.  Completed   HIV Screening  Completed   HPV Vaccine  Aged Out    Advanced directives: Information on Advanced Care Planning can be found at [facility] Advance Health Care Directives Advance Health Care Directives ([email or web address]    Conditions/risks identified: Aim for 30 minutes of exercise or brisk walking, 6-8 glasses of water, and 5 servings of fruits and vegetables each day.  Next appointment: Follow up in one year for your annual wellness visit.   Preventive Care 44-46 Years Old, Female Preventive care refers to lifestyle choices and visits with your health care provider that can promote health and wellness. Preventive care visits are also called wellness exams. What can I expect for my preventive care visit? Counseling During your preventive care visit, your health care provider may ask about your: Medical history, including: Past medical problems. Family medical history. Pregnancy history. Current health, including: Menstrual cycle. Method of birth control. Emotional well-being. Home life and relationship well-being. Sexual activity and sexual health. Lifestyle, including: Alcohol, nicotine or tobacco, and drug use. Access to  firearms. Diet, exercise, and sleep habits. Work and work Astronomer. Sunscreen use. Safety issues such as seatbelt and bike helmet use. Physical exam Your health care provider may check your: Height and weight. These may be used to calculate your BMI (body mass index). BMI is a measurement that tells if you are at a healthy weight. Waist circumference. This measures the distance around your waistline. This measurement also tells if you are at a healthy weight and may help predict your risk of certain diseases, such as type 2 diabetes and high blood pressure. Heart rate and blood pressure. Body temperature. Skin for abnormal spots. What immunizations do I need? Vaccines are usually given at various ages, according to a schedule. Your health care provider will recommend vaccines for you based on your age, medical history, and lifestyle or other factors, such as travel or where you work. What tests do I need? Screening Your health care provider may recommend screening tests for certain conditions. This may include: Pelvic exam and Pap test. Lipid and cholesterol levels. Diabetes screening. This is done by checking your blood sugar (glucose) after you have not eaten for a while (fasting). Hepatitis B test. Hepatitis C test. HIV (human immunodeficiency virus) test. STI (sexually transmitted infection) testing, if you are at risk. BRCA-related cancer screening. This may be done if you have a family history of breast, ovarian, tubal, or peritoneal cancers. Talk with your health care provider about your test results, treatment options, and if necessary, the need for more tests. Follow these instructions at home: Eating  and drinking  Eat a healthy diet that includes fresh fruits and vegetables, whole grains, lean protein, and low-fat dairy products. Take vitamin and mineral supplements as recommended by your health care provider. Do not drink alcohol if: Your health care provider tells you  not to drink. You are pregnant, may be pregnant, or are planning to become pregnant. If you drink alcohol: Limit how much you have to 0-1 drink a day. Know how much alcohol is in your drink. In the [place] one drink equals one 12 oz bottle of beer (355 mL), one 5 oz glass of wine (148 mL), or one 1 oz glass of hard liquor (44 mL). Lifestyle Brush your teeth every morning and night with fluoride toothpaste. Floss one time each day. Exercise for at least 30 minutes 5 or more days each week. Do not use any products that contain nicotine or tobacco. These products include cigarettes, chewing tobacco, and vaping devices, such as e-cigarettes. If you need help quitting, ask your health care provider. Do not use drugs. If you are sexually active, practice safe sex. Use a condom or other form of protection to prevent STIs. If you do not wish to become pregnant, use a form of birth control. If you plan to become pregnant, see your health care provider for a prepregnancy visit. Find healthy ways to manage stress, such as: Meditation, yoga, or listening to music. Journaling. Talking to a trusted person. Spending time with friends and family. Minimize exposure to UV radiation to reduce your risk of skin cancer. Safety Always wear your seat belt while driving or riding in a vehicle. Do not drive: If you have been drinking alcohol. Do not ride with someone who has been drinking. If you have been using any mind-altering substances or drugs. While texting. When you are tired or distracted. Wear a helmet and other protective equipment during sports activities. If you have firearms in your house, make sure you follow all gun safety procedures. Seek help if you have been physically or sexually abused. What's next? Go to your health care provider once a year for an annual wellness visit. Ask your health care provider how often you should have your eyes and teeth checked. Stay up to date on all  vaccines. This information is not intended to replace advice given to you by your health care provider. Make sure you discuss any questions you have with your health care provider. Document Revised: 12/01/2020 Document Reviewed: 12/01/2020 Elsevier Patient Education  2022 [employer].

## 2022-10-31 NOTE — Progress Notes (Signed)
Subjective:   [name] [name] is a 37 y.o. female who presents for an Initial Medicare Annual Wellness Visit.  I connected with  [facility] on 10/31/22 by a audio enabled telemedicine application and verified that I am speaking with the correct person using two identifiers.  Patient Location: Home  Provider Location: Home Office  I discussed the limitations of evaluation and management by telemedicine. The patient expressed understanding and agreed to proceed.  Review of Systems     Cardiac Risk Factors include: hypertension;sedentary lifestyle     Objective:    Today's Vitals   10/31/22 0900  Weight: 204 lb (92.5 kg)  Height: 5\' 7"  (1.702 m)   Body mass index is 31.95 kg/m.     10/31/2022    9:45 AM 05/30/2019    7:08 AM 11/09/2018   12:20 PM 05/27/2018    3:47 PM 05/10/2018   12:00 AM 05/09/2018    3:30 PM 12/14/2016    8:10 AM  Advanced Directives  Does Patient Have a Medical Advance Directive? No Yes No No No No No  Type of Furniture conservator/restorer;Living will       Does patient want to make changes to medical advance directive?  No - Patient declined       Copy of Healthcare Power of Attorney in Chart?  No - copy requested       Would patient like information on creating a medical advance directive? Yes (MAU/Ambulatory/Procedural Areas - Information given)  No - Patient declined No - Patient declined No - Patient declined No - Patient declined No - Patient declined    Current Medications (verified) Outpatient Encounter Medications as of 10/31/2022  Medication Sig   acetaminophen (TYLENOL) 325 MG tablet Take 1-2 tablets (325-650 mg total) by mouth every 4 (four) hours as needed for mild pain. (Patient taking differently: Take 325 mg by mouth daily as needed for mild pain (pain).)   amLODipine (NORVASC) 10 MG tablet Take 1 tablet (10 mg total) by mouth daily.   EQ ALLERGY RELIEF, CETIRIZINE, 10 MG tablet Take 1 tablet by mouth once daily  (Patient taking differently: Take 10 mg by mouth at bedtime.)   fluticasone (FLONASE) 50 MCG/ACT nasal spray Place 1 spray into both nostrils daily. (Patient taking differently: Place 1 spray into both nostrils at bedtime.)   levETIRAcetam (KEPPRA XR) 500 MG 24 hr tablet Take 4 tablets (2,000 mg total) by mouth daily.   metroNIDAZOLE (FLAGYL) 500 MG tablet Take 1 tablet (500 mg total) by mouth 2 (two) times daily.   montelukast (SINGULAIR) 10 MG tablet Take 1 tablet (10 mg total) by mouth at bedtime.   Multiple Vitamin (MULTIVITAMIN WITH MINERALS) TABS tablet Take 1 tablet by mouth daily. (Patient taking differently: Take 1 tablet by mouth at bedtime.)   TRIAMCINOLONE ACETONIDE EX Apply 1 application. topically daily as needed (eczema flare).   No facility-administered encounter medications on file as of 10/31/2022.    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;  Surgeon: Lisbeth Renshaw, MD;  Location: [facility] OR;  Service: Neurosurgery;  Laterality: N/A;   IR 3D INDEPENDENT WKST  05/10/2018   IR ANGIO INTRA EXTRACRAN SEL INTERNAL CAROTID BILAT MOD SED  05/10/2018   IR ANGIO INTRA EXTRACRAN SEL INTERNAL CAROTID BILAT MOD SED  05/30/2019   IR ANGIO VERTEBRAL SEL VERTEBRAL BILAT MOD SED  05/10/2018   IR ANGIO VERTEBRAL SEL VERTEBRAL UNI R MOD SED  05/30/2019   IR US GUIDE VASC ACCESS RIGHT  05/30/2019   RADIOLOGY WITH ANESTHESIA N/A 05/10/2018   Procedure:  EMBOLIZATION OF ANEURSYM;  Surgeon: Lisbeth Renshaw, MD;  Location: [facility] OR;  Service: Radiology;  Laterality: N/A;   TUBAL LIGATION N/A 12/15/2016   Procedure: POST PARTUM TUBAL LIGATION;  Surgeon: Catalina Antigua, MD;  Location: [facility] ORS;  Service: Gynecology;  Laterality: N/A;   WISDOM TOOTH EXTRACTION     Family History  Adopted: Yes  Problem Relation Age of Onset   Hypertension Father    Hypertension Maternal Aunt    Hypertension Maternal Uncle    Hypertension Maternal Grandmother    Eczema Maternal Grandmother    Hypertension Maternal Grandfather    Hypertension Paternal Grandmother    Hypertension Mother    Eczema Mother    Social History   Socioeconomic History   Marital status: Single    Spouse name: Not on file   Number of children: 2   Years of education: Not on file   Highest education level: Some college, no degree  Occupational History   Not on file  Tobacco Use   Smoking status: Every Day    Packs/day: .25    Types: Cigarettes   Smokeless tobacco: Former  Building services engineer Use: Never used  Substance and Sexual Activity   Alcohol use: No   Drug use: Not Currently    Types: Marijuana    Comment: used to yrs ago   Sexual activity: Not Currently    Birth control/protection: None  Other Topics Concern   Not on file  Social History Narrative   Lives with 2 children   Coffee 2 cups daily   Social Determinants of Health   Financial Resource Strain: Low Risk  (10/31/2022)   Overall Financial Resource Strain (CARDIA)    Difficulty of Paying Living Expenses: Not hard at all  Food Insecurity: No Food Insecurity (10/31/2022)   Hunger Vital Sign    Worried About Running Out of Food in the Last Year: Never true    Ran Out of Food in the Last Year: Never true  Transportation Needs: No Transportation Needs (10/31/2022)   PRAPARE - Administrator, Civil Service (Medical): No    Lack of Transportation (Non-Medical): No  Physical Activity:  Insufficiently Active (10/31/2022)   Exercise Vital Sign    Days of Exercise per Week: 5 days    Minutes of Exercise per Session: 20 min  Stress: Stress Concern Present (10/31/2022)   [employer] of Occupational Health - Occupational Stress Questionnaire    Feeling of Stress : To some extent  Social Connections: Moderately Isolated (10/31/2022)   Social Connection and Isolation Panel [NHANES]    Frequency of Communication with Friends and Family: More than three times a week    Frequency of Social Gatherings with Friends and Family: Once a week    Attends Religious Services: 1 to 4 times per year  Active Member of Clubs or Organizations: No    Attends Banker Meetings: Never    Marital Status: Never married    Tobacco Counseling Ready to quit: Not Answered Counseling given: Not Answered   Clinical Intake:  Pre-visit preparation completed: Yes  Pain : No/denies pain  Diabetes: No  How often do you need to have someone help you when you read instructions, pamphlets, or other written materials from your doctor or pharmacy?: 1 - Never  Diabetic?No   Interpreter Needed?: No  Information entered by :: Kandis Fantasia LPN   Activities of Daily Living    10/31/2022    9:45 AM 10/31/2022    8:22 AM  In your present state of health, do you have any difficulty performing the following activities:  Hearing? 0 0  Vision? 1 1  Difficulty concentrating or making decisions? 0 0  Walking or climbing stairs? 1 1  Dressing or bathing? 0 0  Doing errands, shopping? 1 1  Preparing Food and eating ? N N  Using the Toilet? N N  In the past six months, have you accidently leaked urine? N N  Do you have problems with loss of bowel control? N N  Managing your Medications? N N  Managing your Finances? N N  Housekeeping or managing your Housekeeping? N N    Patient Care Team: Hoy Register, MD as PCP - General (Family Medicine) Shawnie Dapper, NP as Nurse Practitioner  (Family Medicine)  Indicate any recent Medical Services you may have received from other than [facility] providers in the past year (date may be approximate).     Assessment:   This is a routine wellness examination for Erskine.  Hearing/Vision screen Hearing Screening - Comments:: Denies hearing difficulties   Vision Screening - Comments:: No vision problems; will schedule routine eye exam soon     Dietary issues and exercise activities discussed: Current Exercise Habits: Home exercise routine, Type of exercise: walking;stretching, Time (Minutes): 30, Frequency (Times/Week): 5, Weekly Exercise (Minutes/Week): 150, Intensity: Mild   Goals Addressed             This Visit's Progress    Remain active and manage health        Depression Screen    10/31/2022    9:40 AM 10/11/2022   11:03 AM 06/05/2022    3:49 PM 09/05/2021    3:54 PM 01/03/2021    1:46 PM 09/06/2020    3:33 PM 01/07/2020    2:56 PM  PHQ 2/9 Scores  PHQ - 2 Score 0 0 0 0 0 0 0  PHQ- 9 Score 3 3 0 0 0  0    Fall Risk    10/31/2022    9:44 AM 10/31/2022    8:22 AM 10/11/2022   11:03 AM 06/05/2022    3:48 PM 09/05/2021    3:54 PM  Fall Risk   Falls in the past year? 1 1 0 0 0  Number falls in past yr: 0 0 0 0 0  Injury with Fall? 0 0 0 0 0  Risk for fall due to : History of fall(s)  No Fall Risks No Fall Risks   Follow up Falls evaluation completed;Education provided;Falls prevention discussed        FALL RISK PREVENTION PERTAINING TO THE HOME:  Any stairs in or around the home? No  If so, are there any without handrails? No  Home free of loose throw rugs in walkways, pet beds, electrical cords, etc? Yes  Adequate lighting in your home to reduce risk of falls? Yes   ASSISTIVE DEVICES UTILIZED TO PREVENT FALLS:  Life alert? No  Use of a cane, walker or w/c? No  Grab bars in the bathroom? Yes  Shower chair or bench in shower? No  Elevated toilet seat or a handicapped toilet? No   TIMED UP AND GO:  Was  the test performed? No . Telephonic visit   Cognitive Function:        10/31/2022    9:45 AM  6CIT Screen  What Year? 0 points  What month? 0 points  What time? 0 points  Count back from 20 0 points  Months in reverse 0 points  Repeat phrase 0 points  Total Score 0 points    Immunizations Immunization History  Administered Date(s) Administered   Influenza,inj,Quad PF,6+ Mos 05/29/2013, 06/16/2014, 06/01/2015, 06/22/2016, 04/12/2018   PFIZER(Purple Top)SARS-COV-2 Vaccination 10/02/2019, 10/27/2019   Pneumococcal Polysaccharide-23 05/28/2013   Tdap 04/24/2013, 08/12/2014, 09/25/2016    TDAP status: Up to date  Flu Vaccine status: Up to date  Covid-19 vaccine status: Information provided on how to obtain vaccines.   Qualifies for Shingles Vaccine? No    Screening Tests Health Maintenance  Topic Date Due   COVID-19 Vaccine (3 - 2023-24 season) 02/17/2022   INFLUENZA VACCINE  01/18/2023   Medicare Annual Wellness (AWV)  10/31/2023   PAP SMEAR-Modifier  10/10/2025   DTaP/Tdap/Td (4 - Td or Tdap) 09/26/2026   Hepatitis C Screening  Completed   HIV Screening  Completed   HPV VACCINES  Aged Out    Health Maintenance  Health Maintenance Due  Topic Date Due   COVID-19 Vaccine (3 - 2023-24 season) 02/17/2022    Lung Cancer Screening: (Low Dose CT Chest recommended if Age 1-80 years, 30 pack-year currently smoking OR have quit w/in 15years.) does not qualify.   Lung Cancer Screening Referral: n/a  Additional Screening:  Hepatitis C Screening: does qualify; Completed 01/07/20  Vision Screening: Recommended annual ophthalmology exams for early detection of glaucoma and other disorders of the eye. Is the patient up to date with their annual eye exam?  No  Who is the provider or what is the name of the office in which the patient attends annual eye exams? none If pt is not established with a provider, would they like to be referred to a provider to establish care? No .    Dental Screening: Recommended annual dental exams for proper oral hygiene  Community Resource Referral / Chronic Care Management: CRR required this visit?  No   CCM required this visit?  No      Plan:     I have personally reviewed and noted the following in the patient's chart:   Medical and social history Use of alcohol, tobacco or illicit drugs  Current medications and supplements including opioid prescriptions. Patient is not currently taking opioid prescriptions. Functional ability and status Nutritional status Physical activity Advanced directives List of other physicians Hospitalizations, surgeries, and ER visits in previous 12 months Vitals Screenings to include cognitive, depression, and falls Referrals and appointments  In addition, I have reviewed and discussed with patient certain preventive protocols, quality metrics, and best practice recommendations. A written personalized care plan for preventive services as well as general preventive health recommendations were provided to patient.     Durwin Nora, [place]   7/82/9562   Due to this being a virtual visit, the after visit summary with patients personalized plan was offered to  patient via mail or my-chart.  Patient would like to access on my-chart  Nurse Notes: No concerns

## 2023-01-01 ENCOUNTER — Other Ambulatory Visit: Payer: Self-pay | Admitting: Family Medicine

## 2023-01-01 DIAGNOSIS — I10 Essential (primary) hypertension: Secondary | ICD-10-CM

## 2023-01-01 DIAGNOSIS — J309 Allergic rhinitis, unspecified: Secondary | ICD-10-CM

## 2023-01-01 NOTE — Telephone Encounter (Signed)
Requested Prescriptions  Pending Prescriptions Disp Refills   amLODipine (NORVASC) 10 MG tablet [Pharmacy Med Name: amLODIPine Besylate 10 MG Oral Tablet] 90 tablet 0    Sig: Take 1 tablet by mouth once daily     Cardiovascular: Calcium Channel Blockers 2 Failed - 01/01/2023  6:53 AM      Failed - Last BP in normal range    BP Readings from Last 1 Encounters:  10/11/22 (!) 138/97         Passed - Last Heart Rate in normal range    Pulse Readings from Last 1 Encounters:  10/11/22 (!) 57         Passed - Valid encounter within last 6 months    Recent Outpatient Visits           2 months ago Screening for malignant neoplasm of cervix performed   [facility] [place], [place], [place]   7 months ago Tobacco abuse   [facility] [place], Odette Horns, MD   1 year ago Seizure [facility] [place])   [facility] Hoy Register, MD   1 year ago Need for hepatitis C screening test   [facility] Hoy Register, [facility]   2 years ago Tearing eyes   [facility] Hoy Register, MD       Future Appointments             In 3 months Lomax, Amy, NP [place] [facility]             montelukast (SINGULAIR) 10 MG tablet [Pharmacy Med Name: Montelukast Sodium 10 MG Oral Tablet] 90 tablet 1    Sig: TAKE 1 TABLET BY MOUTH AT BEDTIME     Pulmonology:  Leukotriene Inhibitors Passed - 01/01/2023  6:53 AM      Passed - Valid encounter within last 12 months    Recent Outpatient Visits           2 months ago Screening for malignant neoplasm of cervix performed   [facility] [place], [place], [place]   7 months ago Tobacco abuse   [facility] [place], Odette Horns, MD   1 year ago Seizure [facility])   [facility] Hoy Register, MD    1 year ago Need for hepatitis C screening test   [facility] Hoy Register, MD   2 years ago Tearing eyes   [place] [facility] Hoy Register, MD       Future Appointments             In 3 months Lomax, Amy, NP [facility]

## 2023-03-29 ENCOUNTER — Ambulatory Visit: Payer: Self-pay | Admitting: *Deleted

## 2023-03-29 NOTE — Telephone Encounter (Signed)
  Chief Complaint: Rash all over her body from feet to her face Symptoms: Having a flare up of her eczema  Frequency: "For a while" Pertinent Negatives: Patient denies any of the OTC meds helping Disposition: [] ED /[] Urgent Care (no appt availability in office) / [x] Appointment(In office/virtual)/ []  [facility]/ [] Home Care/ [] Refused Recommended Disposition /[]  Mobile Bus/ []  Follow-up with PCP Additional Notes: I made her an appt with Dr. Shan Levans for 04/03/2023 at 10:30.  Soonest appt due to her schedule.

## 2023-03-29 NOTE — Telephone Encounter (Signed)
Message from [place] T sent at 03/29/2023  9:12 AM EDT  Summary: medication request   Patient called stated she has a rash all over the body and has tried all the over the counter meds but they are not working. Patient would like something prescribed to help with the rash          Call History  Contact Date/Time Type Contact Phone/Fax User  03/29/2023 09:11 AM EDT Phone (Incoming) [place], [place] (Self) [phone] Rexene Edison) [employer]   Reason for Disposition  Mild widespread rash  (Exception: Heat rash lasting 3 days or less.)  Answer Assessment - Initial Assessment Questions 1. APPEARANCE of RASH: "Describe the rash." (e.g., spots, blisters, raised areas, skin peeling, scaly)     I have eczemas all over my body.  It burns and itches. 2. SIZE: "How big are the spots?" (e.g., tip of pen, eraser, coin; inches, centimeters)     Not asked 3. LOCATION: "Where is the rash located?"     All over her body from feet to my face 4. COLOR: "What color is the rash?" (Note: It is difficult to assess rash color in people with darker-colored skin. When this situation occurs, simply ask the caller to describe what they see.)     It's every where from my feet to my face.  I have eczema and it's flaring up.   I've tried the OTC stuff and nothing is helping it. 5. ONSET: "When did the rash begin?"     I've had it a while. 6. FEVER: "Do you have a fever?" If Yes, ask: "What is your temperature, how was it measured, and when did it start?"     Not asked 7. ITCHING: "Does the rash itch?" If Yes, ask: "How bad is the itch?" (Scale 1-10; or mild, moderate, severe)     Yes 8. CAUSE: "What do you think is causing the rash?"    Eczema  9. MEDICINE FACTORS: "Have you started any new medicines within the last 2 weeks?" (e.g., antibiotics)      Yes I've tried multiple OTC meds without relief 10. OTHER SYMPTOMS: "Do you have any other symptoms?" (e.g., dizziness, headache, sore throat, joint pain)        No 11. PREGNANCY: "Is there any chance you are pregnant?" "When was your last menstrual period?"       Not asked  Protocols used: Rash or Redness - [facility]

## 2023-04-02 NOTE — Progress Notes (Unsigned)
   Acute Office Visit  Subjective:     Patient ID: [name] [name], female    DOB: 1985-11-15, 37 y.o.   MRN: [id number]  No chief complaint on file.   HPI Patient is in today for ***  ROS      Objective:    There were no vitals taken for this visit. {Vitals History (Optional):23777}  Physical Exam  No results found for any visits on 04/03/23.      Assessment & Plan:   Problem List Items Addressed This Visit   None   No orders of the defined types were placed in this encounter.   No follow-ups on file.  Shan Levans, MD

## 2023-04-03 ENCOUNTER — Encounter: Payer: Self-pay | Admitting: Critical Care Medicine

## 2023-04-03 ENCOUNTER — Ambulatory Visit: Payer: Medicare HMO | Attending: Critical Care Medicine | Admitting: Critical Care Medicine

## 2023-04-03 ENCOUNTER — Other Ambulatory Visit: Payer: Self-pay | Admitting: Family Medicine

## 2023-04-03 VITALS — BP 125/84 | HR 101 | Wt 223.4 lb

## 2023-04-03 DIAGNOSIS — L03119 Cellulitis of unspecified part of limb: Secondary | ICD-10-CM | POA: Diagnosis not present

## 2023-04-03 DIAGNOSIS — L509 Urticaria, unspecified: Secondary | ICD-10-CM

## 2023-04-03 DIAGNOSIS — G40909 Epilepsy, unspecified, not intractable, without status epilepticus: Secondary | ICD-10-CM

## 2023-04-03 DIAGNOSIS — I10 Essential (primary) hypertension: Secondary | ICD-10-CM

## 2023-04-03 DIAGNOSIS — L308 Other specified dermatitis: Secondary | ICD-10-CM

## 2023-04-03 DIAGNOSIS — J309 Allergic rhinitis, unspecified: Secondary | ICD-10-CM

## 2023-04-03 DIAGNOSIS — L309 Dermatitis, unspecified: Secondary | ICD-10-CM | POA: Insufficient documentation

## 2023-04-03 MED ORDER — METHYLPREDNISOLONE ACETATE 40 MG/ML IJ SUSP
80.0000 mg | Freq: Once | INTRAMUSCULAR | Status: AC
Start: 2023-04-03 — End: 2023-04-03
  Administered 2023-04-03: 80 mg via INTRAMUSCULAR

## 2023-04-03 MED ORDER — DOXYCYCLINE HYCLATE 100 MG PO TABS: 100.0000 mg | ORAL_TABLET | Freq: Two times a day (BID) | ORAL | 0 refills | Status: DC

## 2023-04-03 MED ORDER — METHYLPREDNISOLONE ACETATE 80 MG/ML IJ SUSP
80.0000 mg | Freq: Once | INTRAMUSCULAR | Status: DC
Start: 2023-04-03 — End: 2023-04-03

## 2023-04-03 MED ORDER — HYDROXYZINE PAMOATE 50 MG PO CAPS: 50.0000 mg | ORAL_CAPSULE | Freq: Four times a day (QID) | ORAL | 0 refills | Status: DC | PRN

## 2023-04-03 MED ORDER — PREDNISONE 10 MG PO TABS: ORAL_TABLET | ORAL | 0 refills | Status: DC

## 2023-04-03 MED ORDER — METHYLPREDNISOLONE ACETATE 80 MG/ML IJ SUSP: 80.0000 mg | Freq: Once | INTRAMUSCULAR | Status: DC

## 2023-04-03 MED ORDER — TRIAMCINOLONE ACETONIDE 0.1 % EX CREA: 1.0000 | TOPICAL_CREAM | Freq: Two times a day (BID) | CUTANEOUS | 3 refills | Status: DC

## 2023-04-03 NOTE — Addendum Note (Signed)
Addended by: Ronette Deter on: 04/03/2023 01:28 PM   Modules accepted: Orders

## 2023-04-03 NOTE — Telephone Encounter (Signed)
Requested Prescriptions  Pending Prescriptions Disp Refills   amLODipine (NORVASC) 10 MG tablet [Pharmacy Med Name: amLODIPine Besylate 10 MG Oral Tablet] 90 tablet 0    Sig: Take 1 tablet by mouth once daily     Cardiovascular: Calcium Channel Blockers 2 Passed - 04/03/2023 11:25 AM      Passed - Last BP in normal range    BP Readings from Last 1 Encounters:  04/03/23 125/84         Passed - Last Heart Rate in normal range    Pulse Readings from Last 1 Encounters:  04/03/23 (!) 101         Passed - Valid encounter within last 6 months    Recent Outpatient Visits           Today Other eczema   [facility] Storm Frisk, MD   5 months ago Screening for malignant neoplasm of cervix performed   [facility] [place], Marylene Land M, [place]   10 months ago Tobacco abuse   [facility] [place], Odette Horns, MD   1 year ago Seizure [facility])   [facility] Hoy Register, MD   2 years ago Need for hepatitis C screening test   [facility] Hoy Register, MD       Future Appointments             In 3 weeks Lomax, Amy, NP [facility]             fluticasone [facility]) 50 MCG/ACT nasal spray [Pharmacy Med Name: Fluticasone Propionate 50 MCG/ACT Nasal Suspension] 16 g 0    Sig: Use 1 spray(s) in each nostril once daily     Ear, Nose, and Throat: Nasal Preparations - Corticosteroids Passed - 04/03/2023 11:25 AM      Passed - Valid encounter within last 12 months    Recent Outpatient Visits           Today Other eczema   [facility] Storm Frisk, MD   5 months ago Screening for malignant neoplasm of cervix performed   [facility] [place], Marylene Land M, [place]   10 months ago Tobacco abuse   [facility] [place], Odette Horns, MD   1 year ago Seizure [facility])   [facility] Hoy Register, [place]   2 years ago Need for hepatitis C screening test   [facility] [place], Odette Horns, MD       Future Appointments             In 3 weeks Lomax, Amy, NP [place] [facility]             EQ ALLERGY RELIEF, CETIRIZINE, 10 MG tablet [Pharmacy Med Name: EQ Allergy Relief (Cetirizine) 10 MG Oral Tablet] 90 tablet 0    Sig: Take 1 tablet by mouth once daily     Ear, Nose, and Throat:  Antihistamines 2 Passed - 04/03/2023 11:25 AM      Passed - Cr in normal range and within 360 days    Creat  Date Value Ref Range Status  06/29/2016 0.40 (L) 0.50 - 1.10 mg/dL Final   Creatinine, Ser  Date Value Ref Range Status  06/05/2022 0.71 0.57 -  1.00 mg/dL Final   Creatinine, Urine  Date Value Ref Range Status  06/29/2016 78 20 - 320 mg/dL Final         Passed - Valid encounter within last 12 months    Recent Outpatient Visits           Today Other eczema   [facility] Storm Frisk, MD   5 months ago Screening for malignant neoplasm of cervix performed   [facility] [place], Marylene Land M, [place]   10 months ago Tobacco abuse   [facility] Hoy Register, MD   1 year ago Seizure [facility])   [facility] Hoy Register, [place]   2 years ago Need for hepatitis C screening test   [facility] Hoy Register, MD       Future Appointments             In 3 weeks Lomax, Amy, NP [facility]

## 2023-04-03 NOTE — Patient Instructions (Addendum)
  Start prednisone as  prescribed in 3 days Start doxycycline twice daily Hydroxyzine for itching three times a day Steroid cream to affected areas twice daily Referral to dermatology made Labs today Return Dr Alvis Lemmings 3 weeks

## 2023-04-03 NOTE — Assessment & Plan Note (Signed)
Begin doxycycline twice daily for 7 days

## 2023-04-03 NOTE — Assessment & Plan Note (Addendum)
Eczema involving lower extremities and upper arms see hives assessment  Apply topical steroid

## 2023-04-03 NOTE — Assessment & Plan Note (Signed)
Controlled with amlodipine.

## 2023-04-03 NOTE — Assessment & Plan Note (Addendum)
Evaluate for hypersensitivity will obtain IgE level obtain CBC and metabolic panel  Will give a Depo-Medrol injection 80 mg followed in 3 days with prednisone taper  Will give hydroxyzine for itching  Referral to dermatology will be made

## 2023-04-03 NOTE — Addendum Note (Signed)
Addended by: Ronette Deter on: 04/03/2023 12:21 PM   Modules accepted: Orders

## 2023-04-04 ENCOUNTER — Ambulatory Visit (HOSPITAL_COMMUNITY)
Admission: EM | Admit: 2023-04-04 | Discharge: 2023-04-04 | Disposition: A | Discharge status: Home / Self Care | Payer: Medicare HMO | Attending: Registered Nurse | Admitting: Registered Nurse

## 2023-04-04 ENCOUNTER — Encounter (HOSPITAL_COMMUNITY): Payer: Self-pay | Admitting: Registered Nurse

## 2023-04-04 DIAGNOSIS — Z560 Unemployment, unspecified: Secondary | ICD-10-CM | POA: Diagnosis not present

## 2023-04-04 DIAGNOSIS — Z789 Other specified health status: Secondary | ICD-10-CM | POA: Diagnosis not present

## 2023-04-04 DIAGNOSIS — R5383 Other fatigue: Secondary | ICD-10-CM | POA: Diagnosis not present

## 2023-04-04 DIAGNOSIS — H18603 Keratoconus, unspecified, bilateral: Secondary | ICD-10-CM | POA: Insufficient documentation

## 2023-04-04 DIAGNOSIS — F4321 Adjustment disorder with depressed mood: Secondary | ICD-10-CM | POA: Diagnosis not present

## 2023-04-04 NOTE — Progress Notes (Signed)
   04/04/23 0806  [facility] Triage Screening (Walk-ins at [facility] only)  How Did You Hear About [name]? Family/Friend  What Is the Reason for Your Visit/Call Today? [name] [name] is a 37 year old female presenting to [facility] accompanied by her aunt. Pt reports she is sad, stressed and having issues coping with grief. Pt reports she has lost her child's father and grandmother this past year. Pt is looking for a grief therapist at this time to help her process through her recent lossess. Pt also reports being interested in intensive outpatient therapy to help with her depression. Pt also reports she not currently taking anti-depressants meds at this time, but could be interested. Pts aunt reports that her niece is beginning to "hoard" things in her home and notices this behavior is new since her nieces recent losses. Pt does report to have a seizure disorder, but no known mental health disorders at this time. Pt currently denies subsatnce use, SI, HI and AVH.  How Long Has This Been Causing You Problems? > than 6 months  Have You Recently Had Any Thoughts About Hurting Yourself? No  Are You Planning to Commit Suicide/Harm Yourself At This time? No  Have you Recently Had Thoughts About Hurting Someone [name]? No  Are You Planning To Harm Someone At This Time? No  Are you currently experiencing any auditory, visual or other hallucinations? No  Have You Used Any Alcohol or Drugs in the Past 24 Hours? No  Do you have any current medical co-morbidities that require immediate attention? No  Clinician description of patient physical appearance/behavior: tearful, cooperative  What Do You Feel Would Help You the Most Today? Treatment for Depression or other mood problem  If access to [facility] was not available, would you have sought care in the Emergency Department? No  Determination of Need Routine (7 days)  Options For Referral Intensive Outpatient Therapy

## 2023-04-04 NOTE — ED Provider Notes (Signed)
Behavioral Health Urgent Care Medical Screening Exam  Patient Name: [name] [name] MRN: [id number] Date of Evaluation: 04/04/23 Chief Complaint:   Diagnosis:  Final diagnoses:  Acute adjustment disorder with depressed mood  Grief    History of Present illness: [name] [name] is a 37 y.o. [name] [name] patient presented to [facility] as a walk in accompanied by her aunt with complaints of depression and grief, seeking outpatient psychiatric services for counseling  [name] [name], 37 y.o., [name] [name] patient seen face to face by this provider, chart reviewed, and consulted with Dr. Gretta Cool on 04/04/23.  On evaluation [name] [name] reports she is "stressed, depressed, and trying to cope with grief." Patient states she lost the father of her child and her grandmother and doesn't feel that she is coping with the losses well.  She also states that she has medical issues that she is dealing with (Keratoconus of both eyes, Psoriasis), and 2 children at home.  Reporting at times feeling overwhelmed and fatigued.  Patient denies prior psychiatric history (no in/outpatient, psychiatric hospitalization, self-harm, suicide attempt, or psychotropic medication).  She states she is wanting to get into counseling first and if that doesn't work, she is open to starting a medication but would like to start with therapy.  Patient denies suicidal ideation "I have 2 kids that I love to live for." She denies homicidal ideation, psychosis, and paranoia.  Patient lives with her 2 children 37 and 40 y/o boys.  She is unemployed but receives disability related to medical issues.  Her aunt who is present during assessment is her primary supporter.   During evaluation [facility] is seated in exam room dressed appropriate for weather with no noted distress.  She is alert/oriented x 4, calm, cooperative, attentive, and responses were relevant and appropriate to assessment questions.  She spoke in a clear tone at moderate  volume, and normal pace, with good eye contact.   She denies suicidal/self-harm/homicidal ideation, psychosis, and paranoia.  Objectively there is no evidence of psychosis/mania or delusional thinking.  She conversed coherently, with goal directed thoughts, no distractibility, or pre-occupation.  At this time Jenice Leiner is educated and verbalizes understanding of mental health resources and other crisis services in the community. She is instructed to call 911 and present to the nearest emergency room should she experience any suicidal/homicidal ideation, auditory/visual/hallucinations, or detrimental worsening of her mental health condition.  She was a also advised by Clinical research associate that she could call the toll-free phone on back of  insurance card to assist with identifying counselors and agencies in network and [place] card to speak with care coordinator.    Flowsheet Row ED from 04/04/2023 in [facility] ED from [facility]06/20[facility] Department at [facility]  C-SSRS RISK CATEGORY No Risk No Risk       Psychiatric Specialty Exam  Presentation  General Appearance:Appropriate for Environment; Casual  Eye Contact:Good  Speech:Clear and Coherent; Normal Rate  Speech Volume:Normal  Handedness:Right   Mood and Affect  Mood: Depressed  Affect: Tearful; Depressed; Congruent   Thought Process  Thought Processes: Coherent; Goal Directed  Descriptions of Associations:Intact  Orientation:Full (Time, Place and Person)  Thought Content:Logical    Hallucinations:None  Ideas of Reference:None  Suicidal Thoughts:No  Homicidal Thoughts:No   Sensorium  Memory: Immediate Good; Recent Good; Remote Good  Judgment: Intact  Insight: Present   Executive Functions  Concentration: Good  Attention Span: Good  Recall: Good  Fund of Knowledge: Good  Language:  Good   Psychomotor Activity  Psychomotor  Activity: Normal   Assets  Assets: Communication Skills; Desire for Improvement; Financial Resources/Insurance; Housing; Leisure Time; Social Support   Sleep  Sleep: Good  Number of hours: No data recorded  Physical Exam: Physical Exam Vitals and nursing note reviewed. Exam conducted with a chaperone present.  Constitutional:      General: She is not in acute distress.    Appearance: Normal appearance. She is not ill-appearing.  HENT:     Head: Normocephalic.  Cardiovascular:     Rate and Rhythm: Normal rate.  Pulmonary:     Effort: Pulmonary effort is normal. No respiratory distress.  Musculoskeletal:        General: Normal range of motion.     Cervical back: Normal range of motion.  Skin:    General: Skin is warm and dry.  Neurological:     Mental Status: She is alert and oriented to person, place, and time.    Review of Systems  Constitutional:        No other complaints voiced  Psychiatric/Behavioral:  Positive for depression. Hallucinations: Denies. Substance abuse: Denies. Suicidal ideas: Denies.The patient is nervous/anxious.   All other systems reviewed and are negative.  Blood pressure 115/89, pulse 89, temperature 98.6 F (37 C), temperature source Oral, resp. rate 20, SpO2 100%, currently breastfeeding. There is no height or weight on file to calculate BMI.  Musculoskeletal: Strength & Muscle Tone: within normal limits Gait & Station: normal Patient leans: N/A   [facility] MSE Discharge Disposition for Follow up and Recommendations: Based on my evaluation the patient does not appear to have an emergency medical condition and can be discharged with resources and follow up care in outpatient services for Medication Management and Individual Therapy   Eleen Litz, NP 04/04/2023, 10:34 AM

## 2023-04-04 NOTE — Discharge Instructions (Addendum)
Based on the information that you have provided and the presenting issues outpatient services and resources for have been recommended.  It is imperative that you follow through with treatment recommendations within 5-7 days from the of discharge to mitigate further risk to your safety and mental well-being. A list of referrals has been provided below to get you started.  You are not limited to the list provided.  In case of an urgent crisis, you may contact the Mobile Crisis Unit with [facility] at [phone].  Writer coordinated an appointment for the patient on 04/06/2023 at 8am with Cyril Loosen for therapy. For med management we have tanika 10/28 at 9am   The patient has to come in to do paperwork prior to appointment.   The address and the phone number to the  [facility] Outpatient clinic is Address: [address] [place], [place], [place] [address]  Open ? 9a -  5:30?PM Phone: [phone]    Services Children & Teens Adults Peer Support Outreach & Education [facility]  [address], [address] [place], [place] [address] Phone: [phone]  [facility] [address] [place], [place] [address] Phone: [phone]

## 2023-04-05 ENCOUNTER — Telehealth: Payer: Self-pay

## 2023-04-05 NOTE — Telephone Encounter (Signed)
Pt was called and is aware of results, DOB was confirmed.  ?

## 2023-04-05 NOTE — Progress Notes (Signed)
Let pt know all labs normal and still waiting on allergy test

## 2023-04-05 NOTE — Telephone Encounter (Signed)
-----   Message from Shan Levans sent at 04/05/2023  5:13 AM EDT ----- Let pt know all labs normal and still waiting on allergy test

## 2023-04-06 ENCOUNTER — Ambulatory Visit: Payer: Self-pay | Admitting: *Deleted

## 2023-04-06 ENCOUNTER — Encounter (HOSPITAL_COMMUNITY): Payer: Self-pay

## 2023-04-06 ENCOUNTER — Ambulatory Visit (INDEPENDENT_AMBULATORY_CARE_PROVIDER_SITE_OTHER): Payer: Medicare HMO | Admitting: Licensed Clinical Social Worker

## 2023-04-06 DIAGNOSIS — F4321 Adjustment disorder with depressed mood: Secondary | ICD-10-CM | POA: Diagnosis not present

## 2023-04-06 NOTE — Telephone Encounter (Signed)
  Chief Complaint: requesting to change medication from cream to ointment for triamcinolone cream  ( Kenalog) 0.1% by Dr. Delford Field. Symptoms: sx continued and not resolving. . Patient reports "ointment always works better than cream" Frequency: na Pertinent Negatives: Patient denies worsening sx  Disposition: [] ED /[] Urgent Care (no appt availability in office) / [] Appointment(In office/virtual)/ []  [facility]/ [] Home Care/ [] Refused Recommended Disposition /[] [facility]/ [x]  Follow-up with PCP Additional Notes:   Requesting medication change.  Summary: Cream not working, needs ointment   The patient called in stating the triamcinolone cream (KENALOG) 0.1 % does not work. She states she needs the ointment instead as that is what she thought she was getting and what helps. Please assist patient further  [facility] [address] [place]), [place] - [id number] Phone: [phone] Fax: [phone]           Reason for Disposition  Caller wants to use a complementary or alternative medicine  Answer Assessment - Initial Assessment Questions 1. NAME of MEDICINE: "What medicine(s) are you calling about?"     Triamcinolone cream ( Kenalog) 0.1% 2. QUESTION: "What is your question?" (e.g., double dose of medicine, side effect)     Can cream be changed to ointment? 3. PRESCRIBER: "Who prescribed the medicine?" Reason: if prescribed by specialist, call should be referred to that group.     Dr. Delford Field  4. SYMPTOMS: "Do you have any symptoms?" If Yes, ask: "What symptoms are you having?"  "How bad are the symptoms (e.g., mild, moderate, severe)     Continue with sx all over and reports in the past ointment has always worked better than cream  5. PREGNANCY:  "Is there any chance that you are pregnant?" "When was your last menstrual period?"     na  Protocols used: Medication Question Call-A-AH
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
## 2023-04-06 NOTE — Telephone Encounter (Signed)
Summary: Cream not working, needs ointment   The patient called in stating the triamcinolone cream (KENALOG) 0.1 % does not work. She states she needs the ointment instead as that is what she thought she was getting and what helps. Please assist patient further  [facility] [address] [place]), [place] - [address] Phone: [phone] Fax: [phone]      Called patient #[phone] to review medication request change. No answer, LVMTCB# [phone].

## 2023-04-07 LAB — CBC WITH DIFFERENTIAL/PLATELET
Basophils Absolute: 0 10*3/uL (ref 0.0–0.2)
Basos: 0 %
EOS (ABSOLUTE): 0.3 10*3/uL (ref 0.0–0.4)
Eos: 4 %
Hematocrit: 38.8 % (ref 34.0–46.6)
Hemoglobin: 12.8 g/dL (ref 11.1–15.9)
Immature Grans (Abs): 0 10*3/uL (ref 0.0–0.1)
Immature Granulocytes: 0 %
Lymphocytes Absolute: 2.2 10*3/uL (ref 0.7–3.1)
Lymphs: 31 %
MCH: 32.4 pg (ref 26.6–33.0)
MCHC: 33 g/dL (ref 31.5–35.7)
MCV: 98 fL — ABNORMAL HIGH (ref 79–97)
Monocytes Absolute: 0.4 10*3/uL (ref 0.1–0.9)
Monocytes: 5 %
Neutrophils Absolute: 4.3 10*3/uL (ref 1.4–7.0)
Neutrophils: 60 %
Platelets: 340 10*3/uL (ref 150–450)
RBC: 3.95 x10E6/uL (ref 3.77–5.28)
RDW: 11.6 % — ABNORMAL LOW (ref 11.7–15.4)
WBC: 7.2 10*3/uL (ref 3.4–10.8)

## 2023-04-07 LAB — COMPREHENSIVE METABOLIC PANEL
ALT: 25 [IU]/L (ref 0–32)
AST: 26 [IU]/L (ref 0–40)
Albumin: 3.4 g/dL — ABNORMAL LOW (ref 3.9–4.9)
Alkaline Phosphatase: 82 [IU]/L (ref 44–121)
BUN/Creatinine Ratio: 6 — ABNORMAL LOW (ref 9–23)
BUN: 4 mg/dL — ABNORMAL LOW (ref 6–20)
Bilirubin Total: 0.3 mg/dL (ref 0.0–1.2)
CO2: 21 mmol/L (ref 20–29)
Calcium: 8.9 mg/dL (ref 8.7–10.2)
Chloride: 105 mmol/L (ref 96–106)
Creatinine, Ser: 0.71 mg/dL (ref 0.57–1.00)
Globulin, Total: 2.9 g/dL (ref 1.5–4.5)
Glucose: 87 mg/dL (ref 70–99)
Potassium: 4.1 mmol/L (ref 3.5–5.2)
Sodium: 141 mmol/L (ref 134–144)
Total Protein: 6.3 g/dL (ref 6.0–8.5)
eGFR: 112 mL/min/{1.73_m2} (ref >59)

## 2023-04-07 LAB — IGE: IgE (Immunoglobulin E), Serum: 20319 [IU]/mL — ABNORMAL HIGH (ref 6–495)

## 2023-04-09 ENCOUNTER — Telehealth: Payer: Self-pay | Admitting: Critical Care Medicine

## 2023-04-09 DIAGNOSIS — R768 Other specified abnormal immunological findings in serum: Secondary | ICD-10-CM | POA: Insufficient documentation

## 2023-04-09 MED ORDER — TRIAMCINOLONE ACETONIDE 0.1 % EX OINT: 1.0000 | TOPICAL_OINTMENT | Freq: Two times a day (BID) | CUTANEOUS | 2 refills | Status: DC

## 2023-04-09 NOTE — Telephone Encounter (Signed)
Pt can be scheduled soon but not urgently.

## 2023-04-09 NOTE — Telephone Encounter (Signed)
Patient informed of extremely high Ig E and need for specialist  She wants ointment form of steroid cream and said steroids have helped  Sent message to you Eno

## 2023-04-10 ENCOUNTER — Other Ambulatory Visit: Payer: Self-pay | Admitting: Physician Assistant

## 2023-04-10 DIAGNOSIS — R768 Other specified abnormal immunological findings in serum: Secondary | ICD-10-CM

## 2023-04-11 ENCOUNTER — Telehealth: Payer: Self-pay

## 2023-04-11 NOTE — Telephone Encounter (Signed)
Called patient and she is aware of referral. Patient has no question or concerns at this time

## 2023-04-11 NOTE — Telephone Encounter (Signed)
-----   Message from Roney Jaffe sent at 04/10/2023 10:37 AM EDT ----- Please see note from Dr. Delford Field.  Referral started

## 2023-04-16 ENCOUNTER — Ambulatory Visit (HOSPITAL_BASED_OUTPATIENT_CLINIC_OR_DEPARTMENT_OTHER): Payer: Medicare HMO | Admitting: Family

## 2023-04-16 ENCOUNTER — Ambulatory Visit (HOSPITAL_COMMUNITY): Payer: Medicare HMO | Admitting: Licensed Clinical Social Worker

## 2023-04-16 ENCOUNTER — Encounter (HOSPITAL_COMMUNITY): Payer: Self-pay | Admitting: Family

## 2023-04-16 VITALS — BP 144/88 | HR 88 | Temp 98.6°F | Resp 18 | Wt 214.0 lb

## 2023-04-16 DIAGNOSIS — F4321 Adjustment disorder with depressed mood: Secondary | ICD-10-CM
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
## 2023-04-20 ENCOUNTER — Ambulatory Visit (INDEPENDENT_AMBULATORY_CARE_PROVIDER_SITE_OTHER): Payer: Medicare HMO | Admitting: Licensed Clinical Social Worker

## 2023-04-20 DIAGNOSIS — F4321 Adjustment disorder with depressed mood: Secondary | ICD-10-CM | POA: Diagnosis not present

## 2023-04-20 NOTE — Progress Notes (Signed)
THERAPIST PROGRESS NOTE   Session Date: 04/20/2023  Session Time: 0804 - 0853 Virtual Visit via Video Note  I connected with [facility] on 04/20/23 at  8:00 AM EDT by a video enabled telemedicine application and verified that I am speaking with the correct person using two identifiers.  Location: Patient: Home Provider: Office   I discussed the limitations of evaluation and management by telemedicine and the availability of in person appointments. The patient expressed understanding and agreed to proceed.   I discussed the assessment and treatment plan with the patient. The patient was provided an opportunity to ask questions and all were answered. The patient agreed with the plan and demonstrated an understanding of the instructions.   The patient was advised to call back or seek an in-person evaluation if the symptoms worsen or if the condition fails to improve as anticipated.  I provided 49 minutes of non-face-to-face time during this encounter.  Participation Level: Active  Behavioral Response: CasualAlertEuthymic  Type of Therapy: Individual Therapy  Treatment Goals addressed: - [name] [name] will score less than 5 on the Generalized Anxiety Disorder 7 Scale (GAD-7)  - Report a decrease in anxiety symptoms as evidenced by an overall reduction in anxiety score by a minimum of 25% on the Generalized Anxiety Disorder Scale (GAD-7)  - I just want to be happy again. - Reduce frequency, intensity, and duration of depression symptoms so that daily functioning is improved - Increase coping skills to manage depression and improve ability to perform daily activities  - [name] [name] will identify cognitive patterns and beliefs that support depression   ProgressTowards Goals: Progressing  Interventions: CBT, Motivational Interviewing, Solution Focused, and Supportive  Summary: [name] [name] is a 37 year old African-American female presenting for follow-up appointment in efforts to improve  management of depressive and anxious symptoms.  Patient reported having a good past 2 weeks since intake session, actively engaging in recounts of prior weeks, detailing their being minimal events however sharing relief at having been able to secure a dermatologist appointment for ongoing eczema concerns.  Patient engaged in readministration of PHQ-9 and GAD-7 and further process reductions in scheduling's in relation to management of symptoms and stressors over the past 2 weeks.  Patient proved unable to identify any significant reduction in stressors and/or depressive and anxious symptoms however presented in improved moods in comparison to intake visit.  Patient did report improvements in sleep, sharing of recent increase in average nightly sleep to between 6-7 hours, versus 2-4 hours as reported 2 weeks ago.  Patient did express this potentially being a factor in improved moods. Patient acknowledged the detriments to engaging in avoidant behaviors and explored potential outcomes of maintaining such behaviors, actively exploring abilities to create as well as implement weekly activity schedule/routine. Patient engaged in session. Patient responded well to interventions. Patient continues to meet criteria for Acute adjustment disorder with depressed mood . Patient will continue to benefit from engagement in outpatient therapy due to being the least restrictive service to meet presenting needs.     04/20/2023    8:18 AM 04/16/2023   11:05 AM 04/06/2023    8:21 AM 04/06/2023    8:17 AM 10/31/2022    9:40 AM  Depression screen PHQ 2/9  Decreased Interest 1 1  1  0  Down, Depressed, Hopeless 1 1  1  0  PHQ - 2 Score 2 2  2  0  Altered sleeping 1 3  3 1   Tired, decreased energy 1 3  3 1   Change in appetite  0 1  1 0  Feeling bad or failure about yourself  0 0  0 0  Trouble concentrating 0 0  0 1  Moving slowly or fidgety/restless 0 0  0 0  Suicidal thoughts 0 0  0 0  PHQ-9 Score 4 9  9 3   Difficult doing  work/chores Somewhat difficult Very difficult Very difficult Very difficult       04/20/2023    8:06 AM 04/06/2023    8:22 AM 10/11/2022   11:03 AM 06/05/2022    3:49 PM  GAD 7 : Generalized Anxiety Score  Nervous, Anxious, on Edge 0 1 0 0  Control/stop worrying 1 3 0 0  Worry too much - different things 1 3 2  0  Trouble relaxing 1 1 1  0  Restless 0 0 1 0  Easily annoyed or irritable 1 1 1  0  Afraid - awful might happen 0 0 0 0  Total GAD 7 Score 4 9 5  0  Anxiety Difficulty Somewhat difficult Extremely difficult      Patient made minor progress on identified treatment goals at this time.   Suicidal/Homicidal: No  Therapist Response: Clinician utilized CBT, MI, and solution focused techniques to address identified tx goals.  Clinician prompted patient to provide recounts of past 2 weeks since initial session, exploring patient's overall activity level and participation in enjoyable activities as assigned for homework to support patient in increasing frequency of engagement in enjoyable experiences.  Clinician readministered PHQ-9 and GAD-7 as planned with the patient in the development of treatment plan to be administered biweekly as a means of aiding in the evaluation of reduction in depressive and anxious symptoms.  Clinician encouraged patient to begin identifying tasks and/or activities in which she engages in regularly in order to support the development of a daily and weekly routine/schedule in order to decrease frequency of isolative behaviors.  Therapist provided support and empathy to patient during session.  Plan: Return again in 2 weeks.  Diagnosis: Acute adjustment disorder with depressed mood  Collaboration of Care: Other None deemed necessary at this time.  Patient/Guardian was advised Release of Information must be obtained prior to any record release in order to collaborate their care with an outside provider. Patient/Guardian was advised if they have not already done so  to contact the registration department to sign all necessary forms in order for [name] to release information regarding their care.   Consent: Patient/Guardian gives verbal consent for treatment and assignment of benefits for services provided during this visit. Patient/Guardian expressed understanding and agreed to proceed.   [name], MSW, LCSW 04/20/2023,  8:09 AM

## 2023-04-23 NOTE — Patient Instructions (Signed)
Below is our plan:  We will ontinue levetiracetam XR 2000mg  daily.   Please make sure you are consistent with timing of seizure medication. I recommend annual visit with primary care provider (PCP) for complete physical and routine blood work. I recommend daily intake of vitamin D (400-800iu) and calcium (800-1000mg ) for bone health. Discuss Dexa screening with PCP.   According to [place] law, you can not drive unless you are seizure / syncope free for at least 6 months and under physician's care.  Please maintain precautions. Do not participate in activities where a loss of awareness could harm you or someone else. No swimming alone, no tub bathing, no hot tubs, no driving, no operating motorized vehicles (cars, ATVs, motocycles, etc), lawnmowers, power tools or firearms. No standing at heights, such as rooftops, ladders or stairs. Avoid hot objects such as stoves, heaters, open fires. Wear a helmet when riding a bicycle, scooter, skateboard, etc. and avoid areas of traffic. Set your water heater to 120 degrees or less.  SUDEP is the sudden, unexpected death of someone with epilepsy, who was otherwise healthy. In SUDEP cases, no other cause of death is found when an autopsy is done. Each year, more than 1 in 1,000 people with epilepsy die from SUDEP. This is the leading cause of death in people with uncontrolled seizures. Until further answers are available, the best way to prevent SUDEP is to lower your risk by controlling seizures. Research has found that people with all types of epilepsy that experience convulsive seizures can be at risk.  Please make sure you are staying well hydrated. I recommend 50-60 ounces daily. Well balanced diet and regular exercise encouraged. Consistent sleep schedule with 6-8 hours recommended.   Please continue follow up with care team as directed.   Follow up with me in 1 year   You may receive a survey regarding today's visit. I encourage you to leave honest feed back  as I do use this information to improve patient care. Thank you for seeing me today!

## 2023-04-23 NOTE — Progress Notes (Unsigned)
PATIENT: [name] [name] DOB: Jun 12, 1986  REASON FOR VISIT: follow up HISTORY FROM: patient  Virtual Visit via Telephone Note  I connected with [facility] on 04/26/23 at  9:30 AM EST by telephone and verified that I am speaking with the correct person using two identifiers.   I discussed the limitations, risks, security and privacy concerns of performing an evaluation and management service by telephone and the availability of in person appointments. I also discussed with the patient that there may be a patient responsible charge related to this service. The patient expressed understanding and agreed to proceed.   History of Present Illness:  04/26/23 ALL (Mychart): [name] [name] returns for follow up for seizures. She continues lev XR 2000mg  daily. She is tolerating med well. No seizure activity. No missed doses. She is followed regularly by PCP.   04/24/2022 ALL: [name] [name] is a 37 y.o. female here today for follow up seizures. She continues levetiracetam XR 2000mg  QD. She reports doing very well. No seizure activity. No missed doses of ASM. She is tolerating medication well. She is followed regularly by PCP. She is feeling well and denies concerns, today.   10/18/21 ALL: [name] [name] returns for follow up for seizures. She was last seen 10/2019 and doing well on levetiracetam 1000mg  BID. She was seen in the ED 10/06/2021 with reported seizures following 2 days of missed AED. Per her boyfriend's report she had two tonic clonic seizure in her sleep lasting approx 3 minutes each. First around 5am. She was able to get up with oldest daughter and get her ready for school. She laid back down with youngest daughter and had another seizure around 11am. She lost bladder control with first seizure. She was having a hard time walking to the car after the second seizure. Her aunt had to help her. Her mother presents with her, today, and tells me that [name] [name] had a conversation with her in the hospital  that she was only taking levetiracetam once daily. [name] [name] doesn't remember the conversation but states that she may have taken once daily for a few days as she was out of the medication. She does admit that she is not as consistent with timing of dosing as she should be. She enjoys staying up late with her kids and probably not getting enough sleep. She drinks mostly coffee and sodas. She is try to cut back on smoking cigarettes. No drugs or alcohol use.   Review of Epic shows she may have had more seizures than mentioned since last visit 10/2019. She was last seen by rehab 08/2020. She is walking about a mile every day. Still has some right sided weakness and expressive aphasia. BP seems well controlled on amlodipine.   11/04/2019 ALL:  [name] [name] is a 37 y.o. female here today for follow up for seizures. She continues levetiracetam 1000mg  twice daily. She called with concerns of seizure activity in her sleep in 09/2019 after missing evening dose of levetiracetam. She does admit to occasional missed doses but does not feel this occurs very often. She is feeling well today. She is tolerating medication well.   She continues close follow up with PCP for stroke prevention. She denies any concerns of stroke like symptoms. BP is normal.    Observations/Objective:  Generalized: Well developed, in no acute distress  Mentation: Alert oriented to time, place, history taking. Follows all commands speech and language fluent   Assessment and Plan:  37 y.o. year old female  has a past medical history  of Asthma, allergic, Bilateral thoracic back pain (09/13/2015), Eczema, Hypertension, Lactose intolerance, Mature cystic teratoma of ovary, Mood swings (06/01/2015), Seasonal allergies, Seizures (HCC), and Stroke (HCC) (04/2018). here with    ICD-10-CM   1. Seizure disorder (HCC)  G40.909       [name] [name] is doing well. No seizure activity and no missed doses of ASM since last visit. Last seizure 09/2021. She  will continue levetiraetam XR 2000mg  daily. Healty lifestyle habits encouraged. Seizure precautions reviewed. She will follow up with me in 1 year, sooner if needed.    No orders of the defined types were placed in this encounter.   Meds ordered this encounter  Medications   levETIRAcetam (KEPPRA XR) 500 MG 24 hr tablet    Sig: Take 4 tablets (2,000 mg total) by mouth daily.    Dispense:  360 tablet    Refill:  3    Order Specific Question:   Supervising Provider    Answer:   Anson Fret [id number]     Follow Up Instructions:  I discussed the assessment and treatment plan with the patient. The patient was provided an opportunity to ask questions and all were answered. The patient agreed with the plan and demonstrated an understanding of the instructions.   The patient was advised to call back or seek an in-person evaluation if the symptoms worsen or if the condition fails to improve as anticipated.  I provided 15 minutes of non-face-to-face time during this encounter. Patient located at their place of residence during Mychart visit. Provider is in the office.    Shawnie Dapper, NP

## 2023-04-24 ENCOUNTER — Encounter: Payer: Self-pay | Admitting: Dermatology

## 2023-04-24 ENCOUNTER — Ambulatory Visit (INDEPENDENT_AMBULATORY_CARE_PROVIDER_SITE_OTHER): Payer: Medicare HMO | Admitting: Dermatology

## 2023-04-24 VITALS — BP 122/75 | HR 62

## 2023-04-24 DIAGNOSIS — L509 Urticaria, unspecified: Secondary | ICD-10-CM | POA: Diagnosis not present

## 2023-04-24 DIAGNOSIS — L309 Dermatitis, unspecified: Secondary | ICD-10-CM | POA: Diagnosis not present

## 2023-04-24 DIAGNOSIS — Z9109 Other allergy status, other than to drugs and biological substances: Secondary | ICD-10-CM

## 2023-04-24 DIAGNOSIS — R768 Other specified abnormal immunological findings in serum: Secondary | ICD-10-CM | POA: Diagnosis not present

## 2023-04-24 NOTE — Patient Instructions (Addendum)
Hello [name] [name],  Thank you for visiting my office today. Your dedication to managing your eczema and improving your overall health is greatly appreciated. Here is a summary of the key instructions and next steps from today's consultation:  - Medications:   - Hydroxyzine: Continue using for itching at nighttime as needed.   - Prednisone Taper: Start a three-week course. Take all tablets in the morning to minimize sleep disturbances.   [name] [name]: Transition to this medication once you receive your new prescription, which will be sent to [facility] pharmacy.  - Lifestyle Adjustments:   - Blood Pressure: Monitor regularly, especially if experiencing symptoms like headaches or eye pain.   - Diet: Manage appetite changes due to prednisone by avoiding excessive salt and considering a reduction in caffeine intake.   - Skin Care: Continue using Eucerin Advanced Repair after showering. Consider using Aquaphor spray to lock in moisture.  - Laboratory Tests and Allergy Panel:   - [facility] Visit: Please go as soon as possible for comprehensive environmental and food allergy panels, important before starting prednisone.  - Follow-Up Appointment:   - Next Visit: Schedule a return visit in one month to discuss Dupixent therapy initiation and review allergy test results.  - Educational Materials:   - Provided information on managing eczema and the use of non-steroidal treatments.  Should you have any questions or require further clarification, please do not hesitate to send [name] [name] a message through MyChart. I look forward to our next meeting and am optimistic about the progress we aim to make with your treatment plan.  Warm regards,  Dr. Langston [name],  Dermatology     Important Information   Due to recent changes in healthcare laws, you may see results of your pathology and/or laboratory studies on MyChart before the doctors have had a chance to review them. We understand that in some cases there may be  results that are confusing or concerning to you. Please understand that not all results are received at the same time and often the doctors may need to interpret multiple results in order to provide you with the best plan of care or course of treatment. Therefore, we ask that you please give [name] [name] 2 business days to thoroughly review all your results before contacting the office for clarification. Should we see a critical lab result, you will be contacted sooner.     If You Need Anything After Your Visit   If you have any questions or concerns for your doctor, please call our main line at [phone]. If no one answers, please leave a voicemail as directed and we will return your call as soon as possible. Messages left after 4 pm will be answered the following business day.    You may also send [name] [name] a message via MyChart. We typically respond to MyChart messages within 1-2 business days.  For prescription refills, please ask your pharmacy to contact our office. Our fax number is [phone].  If you have an urgent issue when the clinic is closed that cannot wait until the next business day, you can page your doctor at the number below.     Please note that while we do our best to be available for urgent issues outside of office hours, we are not available 24/7.    If you have an urgent issue and are unable to reach [name] [name], you may choose to seek medical care at your doctor's office, retail clinic, urgent care center, or emergency room.   If you  have a medical emergency, please immediately call 911 or go to the emergency department. In the event of inclement weather, please call our main line at [phone] for an update on the status of any delays or closures.  Dermatology Medication Tips: Please keep the boxes that topical medications come in in order to help keep track of the instructions about where and how to use these. Pharmacies typically print the medication instructions only on the boxes and  not directly on the medication tubes.   If your medication is too expensive, please contact our office at [phone] or send [name] [name] a message through MyChart.    We are unable to tell what your co-pay for medications will be in advance as this is different depending on your insurance coverage. However, we may be able to find a substitute medication at lower cost or fill out paperwork to get insurance to cover a needed medication.    If a prior authorization is required to get your medication covered by your insurance company, please allow [name] [name] 1-2 business days to complete this process.   Drug prices often vary depending on where the prescription is filled and some pharmacies may offer cheaper prices.   The website www.goodrx.com contains coupons for medications through different pharmacies. The prices here do not account for what the cost may be with help from insurance (it may be cheaper with your insurance), but the website can give you the price if you did not use any insurance.  - You can print the associated coupon and take it with your prescription to the pharmacy.  - You may also stop by our office during regular business hours and pick up a GoodRx coupon card.  - If you need your prescription sent electronically to a different pharmacy, notify our office through [facility] or by phone at [phone]

## 2023-04-24 NOTE — Progress Notes (Signed)
   New Patient Visit   Subjective  [name] [name] is a 37 y.o. female who presents for the following: Eczema  Patient states she has eczema located at the scattered that she would like to have examined. Patient reports the areas have been there for 3 months. She reports the areas are bothersome. Patient rates irritation 6 out of 10. She states that the areas have spread. Patient reports she has previously been treated for these areas. Patient denies Hx of bx. Patient reports when given the Depo-Medrol shot her sxs were somewhat better but never completely resolved. Patient report she was also given hydroxyzine & Prednisone she feels that has helped. She was also prescribe doxycycline for Cellulitis & TMC Ointment which hasn't been effective. Patient reports she has had a similar reaction about 5 years ago. Patient reports unknown triggers or causes.   The following portions of the chart were reviewed this encounter and updated as appropriate: medications, allergies, medical history  Review of Systems:  No other skin or systemic complaints except as noted in HPI or Assessment and Plan.  Objective  Well appearing patient in no apparent distress; mood and affect are within normal limits.   A focused examination was performed of the following areas: Scattered throughout body  Relevant exam findings are noted in the Assessment and Plan.    Assessment & Plan   1. Eczema Flare - Assessment: Current treatment with triamcinolone. Awaiting Zoryve for non-steroid treatment. - Plan: Continue triamcinolone until Zoryve is received. Start Zoryve upon receipt and discontinue triamcinolone. Prescribe a three-week prednisone taper to control the flare. Instruct patient to take prednisone in the morning and monitor blood pressure as needed. Advise avoiding hot water and using warm water for showers. Recommend Eucerin Advanced Repair as a moisturizer post-shower and Aquaphor spray to lock in moisture.  Schedule follow-up in one month to discuss starting Dupixent.  2. Urticarial Reaction - Assessment: Patient experiencing itching. - Plan: Continue hydroxyzine for itching as needed. Monitor response to prednisone taper and Zoryve.  3. Allergies - Assessment: Patient has allergies. - Plan: Order a comprehensive environmental and food allergy panel before starting prednisone. Refer to an allergist for further evaluation and management, including identifying potential triggers. Instruct patient to avoid known allergens.  4. 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 - Plan: Conduct allergy panel testing to investigate potential allergens. Collaborate with allergist for further evaluation and management.  5. Plan for Dupixent - Assessment: Considering Dupixent for long-term treatment. - Plan: Begin process for obtaining Dupixent approval. Discuss Dupixent as a long-term treatment plan at the follow-up appointment in one month.  6. Prescription Refills - Plan: Refill hydroxyzine prescription for itching. Provide a new tub of triamcinolone. [facility] Ship prescription to [facility] pharmacy and obtain insurance information from the patient.  Urticaria  Related Procedures IgE Nut Prof. w/Component Rflx IgE Cat/Dog/Horse w/CompReflex Wheat IgE w/Component Reflex IgE Milk w/ Component Reflex Food Allergy Profile Allergy Panel 18, Nut Mix Group    Return in about 2 months (around 06/24/2023) for Eczema &Urticari F/U.    Documentation: I have reviewed the above documentation for accuracy and completeness, and I agree with the above.  Stasia Cavalier, am acting as scribe for Langston Reusing, DO.   Langston Reusing, DO

## 2023-04-26 ENCOUNTER — Telehealth (INDEPENDENT_AMBULATORY_CARE_PROVIDER_SITE_OTHER): Payer: Medicare HMO | Admitting: Family Medicine

## 2023-04-26 ENCOUNTER — Other Ambulatory Visit: Payer: Self-pay | Admitting: Family Medicine

## 2023-04-26 ENCOUNTER — Encounter: Payer: Self-pay | Admitting: Family Medicine

## 2023-04-26 DIAGNOSIS — G40909 Epilepsy, unspecified, not intractable, without status epilepticus: Secondary | ICD-10-CM | POA: Diagnosis not present

## 2023-04-26 DIAGNOSIS — I10 Essential (primary) hypertension: Secondary | ICD-10-CM

## 2023-04-26 MED ORDER — TRIAMCINOLONE ACETONIDE 0.1 % EX OINT: 1.0000 | TOPICAL_OINTMENT | Freq: Two times a day (BID) | CUTANEOUS | 4 refills | Status: DC

## 2023-04-26 MED ORDER — LEVETIRACETAM ER 500 MG PO TB24: 2000.0000 mg | ORAL_TABLET | Freq: Every day | ORAL | 3 refills | Status: DC

## 2023-04-26 MED ORDER — ZORYVE 0.3 % EX CREA: 1.0000 | TOPICAL_CREAM | Freq: Two times a day (BID) | CUTANEOUS | 4 refills | Status: AC

## 2023-04-26 MED ORDER — PREDNISONE 10 MG PO TABS: ORAL_TABLET | ORAL | 0 refills | Status: AC

## 2023-04-26 MED ORDER — HYDROXYZINE HCL 25 MG PO TABS: 25.0000 mg | ORAL_TABLET | Freq: Three times a day (TID) | ORAL | 3 refills | Status: DC | PRN

## 2023-04-28 ENCOUNTER — Other Ambulatory Visit: Payer: Self-pay | Admitting: Family Medicine

## 2023-04-28 DIAGNOSIS — I10 Essential (primary) hypertension: Secondary | ICD-10-CM

## 2023-04-30 NOTE — Telephone Encounter (Signed)
Requested Prescriptions  Refused Prescriptions Disp Refills   amLODipine (NORVASC) 10 MG tablet [Pharmacy Med Name: amLODIPine Besylate 10 MG Oral Tablet] 90 tablet 0    Sig: Take 1 tablet by mouth once daily     Cardiovascular: Calcium Channel Blockers 2 Passed - 04/28/2023 12:08 PM      Passed - Last BP in normal range    BP Readings from Last 1 Encounters:  04/24/23 122/75         Passed - Last Heart Rate in normal range    Pulse Readings from Last 1 Encounters:  04/24/23 62         Passed - Valid encounter within last 6 months    Recent Outpatient Visits           3 weeks ago Other eczema   [facility] [place]. [facility] Storm Frisk, MD   6 months ago Screening for malignant neoplasm of cervix performed   [facility] [place]. [facility], Marylene Land M, [place]   10 months ago Tobacco abuse   [facility] [place]. [facility] Hoy Register, MD   1 year ago Seizure [facility])   [facility] [place]. [facility] Hoy Register, MD   2 years ago Need for hepatitis C screening test   [facility] [place]. [facility] Hoy Register, MD       Future Appointments             In 1 year Lomax, Amy, NP [facility]

## 2023-05-04 ENCOUNTER — Ambulatory Visit (INDEPENDENT_AMBULATORY_CARE_PROVIDER_SITE_OTHER): Payer: Medicare HMO | Admitting: Licensed Clinical Social Worker

## 2023-05-04 DIAGNOSIS — F4321 Adjustment disorder with depressed mood: Secondary | ICD-10-CM

## 2023-05-04 NOTE — Progress Notes (Signed)
THERAPIST PROGRESS NOTE   Session Date: 05/04/2023  Session Time: 0808 - 0900 Virtual Visit via Video Note  I connected with [name] on 05/04/23 at  8:00 AM EST by a video enabled telemedicine application and verified that I am speaking with the correct person using two identifiers.  Location: Patient: Home Provider: Office   I discussed the limitations of evaluation and management by telemedicine and the availability of in person appointments. The patient expressed understanding and agreed to proceed.    I discussed the assessment and treatment plan with the patient. The patient was provided an opportunity to ask questions and all were answered. The patient agreed with the plan and demonstrated an understanding of the instructions.   The patient was advised to call back or seek an in-person evaluation if the symptoms worsen or if the condition fails to improve as anticipated.  I provided 52 minutes of non-face-to-face time during this encounter.  Participation Level: Active  Behavioral Response: CasualAlertEuthymic  Type of Therapy: Individual Therapy  Treatment Goals addressed: - [name] [name] will score less than 5 on the Generalized Anxiety Disorder 7 Scale (GAD-7)  - Report a decrease in anxiety symptoms as evidenced by an overall reduction in anxiety score by a minimum of 25% on the Generalized Anxiety Disorder Scale (GAD-7)  - I just want to be happy again. - Reduce frequency, intensity, and duration of depression symptoms so that daily functioning is improved - Increase coping skills to manage depression and improve ability to perform daily activities  - [name] [name] will identify cognitive patterns and beliefs that support depression   ProgressTowards Goals: Progressing  Interventions: CBT, Motivational Interviewing, and Supportive  Summary: [name] [name] is a 37 year old African-American female presenting for follow-up appointment in efforts to improve management of  depressive and anxious symptoms.  Patient reported things having gone well over the past two weeks, detailing feeling relief surrounding eczema and overall decrease in depressive and anxious sxs. Pt engaged in review of PHQ9 and GAD7 screenings, exploring where pt is observing sxs present. Explored pt's efforts at coping/managing stressors, identifying means of improving breathing techniques and incorporating additional healthy means of coping. Practiced Box Breathing Technique, including addition of Sensory Imagery technique to support pt in increasing available techniques for managing presenting stressors. Patient actively engaged in session. Patient responded well to interventions. Patient continues to meet criteria for Acute adjustment disorder with depressed mood . Patient will continue to benefit from engagement in outpatient therapy due to being the least restrictive service to meet presenting needs.    05/04/2023    8:11 AM 04/20/2023    8:06 AM 04/06/2023    8:22 AM 10/11/2022   11:03 AM  GAD 7 : Generalized Anxiety Score  Nervous, Anxious, on Edge 0 0 1 0  Control/stop worrying 1 1 3  0  Worry too much - different things 0 1 3 2   Trouble relaxing 1 1 1 1   Restless 0 0 0 1  Easily annoyed or irritable 1 1 1 1   Afraid - awful might happen 0 0 0 0  Total GAD 7 Score 3 4 9 5   Anxiety Difficulty Not difficult at all Somewhat difficult Extremely difficult       05/04/2023    8:13 AM 04/20/2023    8:18 AM 04/16/2023   11:05 AM 04/06/2023    8:21 AM 04/06/2023    8:17 AM  Depression screen PHQ 2/9  Decreased Interest 0 1 1  1   Down, Depressed, Hopeless 1 1 1  1  PHQ - 2 Score 1 2 2  2   Altered sleeping 1 1 3  3   Tired, decreased energy 1 1 3  3   Change in appetite 0 0 1  1  Feeling bad or failure about yourself  0 0 0  0  Trouble concentrating 0 0 0  0  Moving slowly or fidgety/restless 0 0 0  0  Suicidal thoughts 0 0 0  0  PHQ-9 Score 3 4 9  9   Difficult doing work/chores Somewhat  difficult Somewhat difficult Very difficult Very difficult Very difficult   Patient made moderate progress on identified treatment goals at this time.   Suicidal/Homicidal: No  Therapist Response: Clinician utilized CBT, MI, and supportive reflection techniques to address identified tx goals. Clinician readministered PHQ-9 and GAD-7, engaged pt in review of ongoing progression in decreased scalings and explored presence of sxs and stressors. Engaged pt in practicing Box Breathing technique and Sensory Imagery technique to support efforts at managing stressors/triggers. Reminded pt to continue identifying tasks and/or activities in which she engages in regularly in order to support the development of a daily and weekly routine/schedule in order to decrease frequency of isolative behaviors. Therapist provided support and empathy to patient during session.  Plan: Return again in 2 weeks.  Diagnosis: Acute adjustment disorder with depressed mood  Collaboration of Care: Other None deemed necessary at this time.  Patient/Guardian was advised Release of Information must be obtained prior to any record release in order to collaborate their care with an outside provider. Patient/Guardian was advised if they have not already done so to contact the registration department to sign all necessary forms in order for [name] to release information regarding their care.   Consent: Patient/Guardian gives verbal consent for treatment and assignment of benefits for services provided during this visit. Patient/Guardian expressed understanding and agreed to proceed.   [name], MSW, LCSW 05/04/2023,  8:10 AM

## 2023-05-07 ENCOUNTER — Ambulatory Visit (HOSPITAL_COMMUNITY): Payer: Medicare HMO | Admitting: Family

## 2023-05-09 ENCOUNTER — Other Ambulatory Visit: Payer: Self-pay | Admitting: Family Medicine

## 2023-05-09 DIAGNOSIS — I10 Essential (primary) hypertension: Secondary | ICD-10-CM

## 2023-05-09 NOTE — Telephone Encounter (Unsigned)
Copied from CRM [id number]. Topic: General - Other >> May 09, 2023  9:56 AM Everette C wrote: Reason for CRM: Medication Refill - Most Recent Primary Care Visit:  Provider: Storm Frisk Department: [facility] Visit Type: OFFICE VISIT Date: 04/03/2023  Medication: amLODipine (NORVASC) 10 MG tablet [[id number]]  Has the patient contacted their pharmacy? Yes (Agent: If no, request that the patient contact the pharmacy for the refill. If patient does not wish to contact the pharmacy document the reason why and proceed with request.) (Agent: If yes, when and what did the pharmacy advise?)  Is this the correct pharmacy for this prescription? Yes If no, delete pharmacy and type the correct one.  This is the patient's preferred pharmacy:  [facility] [address] ([address] [place] - [address] [place] ([place] [address] Phone: [phone] Fax: [phone]   Has the prescription been filled recently? No  Is the patient out of the medication? Yes  Has the patient been seen for an appointment in the last year OR does the patient have an upcoming appointment? Yes  Can we respond through MyChart? No'  Agent: Please be advised that Rx refills may take up to 3 business days. We ask that you follow-up with your pharmacy.

## 2023-05-10 NOTE — Telephone Encounter (Signed)
Last RF 04/03/23 #90  Requested Prescriptions  Refused Prescriptions Disp Refills   amLODipine (NORVASC) 10 MG tablet 90 tablet 0    Sig: Take 1 tablet (10 mg total) by mouth daily.     Cardiovascular: Calcium Channel Blockers 2 Passed - 05/09/2023 10:35 AM      Passed - Last BP in normal range    BP Readings from Last 1 Encounters:  04/24/23 122/75         Passed - Last Heart Rate in normal range    Pulse Readings from Last 1 Encounters:  04/24/23 62         Passed - Valid encounter within last 6 months    Recent Outpatient Visits           1 month ago Other eczema   [facility] [place]. [facility] Storm Frisk, MD   7 months ago Screening for malignant neoplasm of cervix performed   [facility] Of [place]. [facility], Marylene Land M, [place]   11 months ago Tobacco abuse   [facility] Of [place]. [facility] Hoy Register, MD   1 year ago Seizure [facility])    [facility] [place]. [facility] Hoy Register, MD   2 years ago Need for hepatitis C screening test   [facility] Of [place]. [facility] Hoy Register, MD       Future Appointments             In 12 months Lomax, Amy, NP [facility]

## 2023-05-21 ENCOUNTER — Ambulatory Visit (INDEPENDENT_AMBULATORY_CARE_PROVIDER_SITE_OTHER): Payer: Medicare HMO | Admitting: Licensed Clinical Social Worker

## 2023-05-21 DIAGNOSIS — F4321 Adjustment disorder with depressed mood: Secondary | ICD-10-CM

## 2023-05-21 NOTE — Progress Notes (Signed)
THERAPIST PROGRESS NOTE   Session Date: 05/21/2023  Session Time: 0804 - 0844 Virtual Visit via Video Note  I connected with [facility] on 05/21/23 at  8:00 AM EST by a video enabled telemedicine application and verified that I am speaking with the correct person using two identifiers.  Location: Patient: Home Provider: [facility] OP Office   I discussed the limitations of evaluation and management by telemedicine and the availability of in person appointments. The patient expressed understanding and agreed to proceed.     The patient was advised to call back or seek an in-person evaluation if the symptoms worsen or if the condition fails to improve as anticipated.  I provided 40 minutes of non-face-to-face time during this encounter.  Participation Level: Active  Behavioral Response: CasualAlertEuthymic  Type of Therapy: Individual Therapy  Treatment Goals addressed: - Antonina will score less than 5 on the Generalized Anxiety Disorder 7 Scale (GAD-7)  - Report a decrease in anxiety symptoms as evidenced by an overall reduction in anxiety score by a minimum of 25% on the Generalized Anxiety Disorder Scale (GAD-7)  - I just want to be happy again. - Reduce frequency, intensity, and duration of depression symptoms so that daily functioning is improved - Increase coping skills to manage depression and improve ability to perform daily activities  - Laural will identify cognitive patterns and beliefs that support depression   ProgressTowards Goals: Progressing  Interventions: CBT, Motivational Interviewing, and Supportive  Summary: [name] [name] is a 37 year old African-American female presenting for follow-up appointment in efforts to improve management of depressive and anxious symptoms. Patient reported things having continued to improve over the past two weeks, sharing of continued reduction in physical irritability relating to challenges with eczema, having enjoyed time spent with  family over the holidays, and overall improved moods. Pt engaged in exploration of weekly events, sharing of having spent time outside of the home over the past week with family for Thanksgiving and having enjoyed being around family members that she does not see frequently. Explored pt's perspectives on improved moods and factors that have recently supported improvements. Pt detailed of having incorporated Box Breathing Technique into evening routine, practicing for between 5-30 minutes, in aims of supporting self in decompressing and relaxing prior to bed. Processed efforts at developing weekly routine/schedule in aims to increase time spent engaging in activities and preventing isolation. Patient actively engaged in session. Patient responded well to interventions. Patient continues to meet criteria for Acute adjustment disorder with depressed mood . Patient will continue to benefit from engagement in outpatient therapy due to being the least restrictive service to meet presenting needs.    05/04/2023    8:11 AM 04/20/2023    8:06 AM 04/06/2023    8:22 AM 10/11/2022   11:03 AM  GAD 7 : Generalized Anxiety Score  Nervous, Anxious, on Edge 0 0 1 0  Control/stop worrying 1 1 3  0  Worry too much - different things 0 1 3 2   Trouble relaxing 1 1 1 1   Restless 0 0 0 1  Easily annoyed or irritable 1 1 1 1   Afraid - awful might happen 0 0 0 0  Total GAD 7 Score 3 4 9 5   Anxiety Difficulty Not difficult at all Somewhat difficult Extremely difficult       05/04/2023    8:13 AM 04/20/2023    8:18 AM 04/16/2023   11:05 AM 04/06/2023    8:21 AM 04/06/2023    8:17 AM  Depression screen  PHQ 2/9  Decreased Interest 0 1 1  1   Down, Depressed, Hopeless 1 1 1  1   PHQ - 2 Score 1 2 2  2   Altered sleeping 1 1 3  3   Tired, decreased energy 1 1 3  3   Change in appetite 0 0 1  1  Feeling bad or failure about yourself  0 0 0  0  Trouble concentrating 0 0 0  0  Moving slowly or fidgety/restless 0 0 0  0   Suicidal thoughts 0 0 0  0  PHQ-9 Score 3 4 9  9   Difficult doing work/chores Somewhat difficult Somewhat difficult Very difficult Very difficult Very difficult   Patient made moderate progress on identified treatment goals at this time.   Suicidal/Homicidal: No  Therapist Response: Clinician utilized CBT, MI, and supportive reflection techniques to address identified tx goals. Clinician engaged pt in review of past two weeks, exploring pt's recounts of moods and activities surrounding holidays and time spent with family. Engaged pt in exploration of thoughts and feelings surrounding the anniversaries of family members passing's, and the challenges this creates during holiday seasons. Supported pt in reflecting on feelings and emotions, and allowing self to experience emotions. Revisited pt's efforts at increasing engagement in daily activities, exploring pt's efforts at tracking/writing down weekly routines. Encouraged pt to continue identifying tasks and/or activities in which she engages in regularly over the next week, in order to support the development of a daily and weekly routine/schedule in order to decrease frequency of isolative behaviors. Therapist provided support and empathy to patient during session.  Plan: Return again in 2 weeks.  Diagnosis: Acute adjustment disorder with depressed mood  Collaboration of Care: Other None deemed necessary at this time.  Patient/Guardian was advised Release of Information must be obtained prior to any record release in order to collaborate their care with an outside provider. Patient/Guardian was advised if they have not already done so to contact the registration department to sign all necessary forms in order for [name] to release information regarding their care.   Consent: Patient/Guardian gives verbal consent for treatment and assignment of benefits for services provided during this visit. Patient/Guardian expressed understanding and agreed to  proceed.   [name], MSW, LCSW 05/21/2023,  8:46 AM

## 2023-05-27 NOTE — Progress Notes (Unsigned)
New Patient Note  RE: [name] [name] MRN: [id number] DOB: 11-13-1985 Date of Office Visit: 05/28/2023  Consult requested by: Mayers, Kasandra Knudsen, PA-C Primary care provider: Hoy Register, MD  Chief Complaint: No chief complaint on file.  History of Present Illness: I had the pleasure of seeing [name] [name] for initial evaluation at the [facility] on 05/27/2023. She is a 37 y.o. female, who is referred here by Hoy Register, MD for the evaluation of elevated IgE.  Discussed the use of AI scribe software for clinical note transcription with the patient, who gave verbal consent to proceed.  History of Present Illness             ***  Assessment and Plan: [name] [name] is a 37 y.o. female with: ***  Assessment and Plan               No follow-ups on file.  No orders of the defined types were placed in this encounter.  Lab Orders  No laboratory test(s) ordered today    Other allergy screening: Asthma: {Blank single:19197::"yes","no"} Rhino conjunctivitis: {Blank single:19197::"yes","no"} Food allergy: {Blank single:19197::"yes","no"} Medication allergy: {Blank single:19197::"yes","no"} Hymenoptera allergy: {Blank single:19197::"yes","no"} Urticaria: {Blank single:19197::"yes","no"} Eczema:{Blank single:19197::"yes","no"} History of recurrent infections suggestive of immunodeficency: {Blank single:19197::"yes","no"}  Diagnostics: Spirometry:  Tracings reviewed. Her effort: {Blank single:19197::"Good reproducible efforts.","It was hard to get consistent efforts and there is a question as to whether this reflects a maximal maneuver.","Poor effort, data can not be interpreted."} FVC: ***L FEV1: ***L, ***% predicted FEV1/FVC ratio: ***% Interpretation: {Blank single:19197::"Spirometry consistent with mild obstructive disease","Spirometry consistent with moderate obstructive disease","Spirometry consistent with severe obstructive  disease","Spirometry consistent with possible restrictive disease","Spirometry consistent with mixed obstructive and restrictive disease","Spirometry uninterpretable due to technique","Spirometry consistent with normal pattern","No overt abnormalities noted given today's efforts"}.  Please see scanned spirometry results for details.  Skin Testing: {Blank single:19197::"Select foods","Environmental allergy panel","Environmental allergy panel and select foods","Food allergy panel","None","Deferred due to recent antihistamines use"}. *** Results discussed with patient/family.   Past Medical History: Patient Active Problem List   Diagnosis Date Noted  . 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 Acute adjustment disorder with depressed mood 04/04/2023  . Grief 04/04/2023  . Needs assistance with community resources 04/04/2023  . Eczema 04/03/2023  . Hives 04/03/2023  . Cellulitis of multiple sites of lower extremity 04/03/2023  . Seizures (HCC) 06/10/2019  . Late effect of cerebrovascular accident (CVA) 12/09/2018  . Primary osteoarthritis of right knee   . Knee pain, right   . Benign essential HTN   . History of subarachnoid hemorrhage   . Brain aneurysm   . Gastroesophageal reflux disease without esophagitis 10/09/2016  . BMI 31.0-31.9,adult 07/20/2016  . Tobacco abuse 06/01/2015  . Marijuana use 06/23/2014   Past Medical History:  Diagnosis Date  . Asthma, allergic    uses inhaler prn - infrequently  . Bilateral thoracic back pain 09/13/2015  . Eczema   . Hypertension   . Lactose intolerance   . Mature cystic teratoma of ovary   . Mood swings 06/01/2015  . Seasonal allergies   . Seizures (HCC)   . Stroke [facility]) 04/2018   during aneurysm surgery   Past Surgical History: Past Surgical History:  Procedure Laterality Date  . 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 IR 3D INDEPENDENT WKST  05/10/2018  . IR ANGIO INTRA  EXTRACRAN SEL INTERNAL CAROTID BILAT MOD SED  05/10/2018  . IR ANGIO INTRA EXTRACRAN SEL INTERNAL CAROTID BILAT MOD SED  05/30/2019  . IR ANGIO VERTEBRAL SEL VERTEBRAL BILAT MOD SED  05/10/2018  . IR ANGIO VERTEBRAL SEL VERTEBRAL UNI R MOD SED  05/30/2019  . IR US GUIDE VASC ACCESS RIGHT  05/30/2019  . RADIOLOGY WITH ANESTHESIA N/A 05/10/2018   Procedure: EMBOLIZATION OF ANEURSYM;  Surgeon: Lisbeth Renshaw, MD;  Location[facility] OR;  Service: Radiology;  Laterality: N/A;  . TUBAL LIGATION N/A 12/15/2016   Procedure: POST PARTUM TUBAL LIGATION;  Surgeon: Catalina Antigua, MD;  Location: [facility] ORS;  Service: Gynecology;  Laterality: N/A;  . WISDOM TOOTH EXTRACTION     Medication List:  Current Outpatient Medications  Medication Sig Dispense Refill  . acetaminophen (TYLENOL) 325 MG tablet Take 1-2 tablets (325-650 mg total) by mouth every 4 (four) hours as needed for mild pain. (Patient taking differently: Take 325 mg by mouth daily as needed for mild pain (pain score 1-3) (pain).)    . amLODipine (NORVASC) 10 MG tablet Take 1 tablet by mouth once daily 90 tablet 0  . cetirizine (EQ ALLERGY RELIEF, CETIRIZINE,) 10 MG tablet Take 1 tablet by mouth once daily 90 tablet 0  . doxycycline (VIBRA-TABS) 100 MG tablet Take 1 tablet (100 mg total) by mouth 2 (two) times daily. 14 tablet 0  . fluticasone (FLONASE) 50 MCG/ACT nasal spray Use 1 spray(s) in each nostril once daily 16 g 0  . hydrOXYzine (ATARAX) 25 MG tablet Take 1 tablet (25 mg total) by mouth 3 (three) times daily as needed. 30 tablet 3  . hydrOXYzine (VISTARIL) 50 MG capsule Take 1 capsule (50 mg total) by mouth every 6 (six) hours as needed for itching. 40 capsule 0  . levETIRAcetam (KEPPRA XR) 500 MG 24 hr tablet Take 4 tablets (2,000 mg total) by mouth daily. 360 tablet 3  . montelukast (SINGULAIR) 10 MG tablet TAKE 1 TABLET BY MOUTH AT  BEDTIME 90 tablet 1  . Multiple Vitamin (MULTIVITAMIN WITH MINERALS) TABS tablet Take 1 tablet by mouth daily. (Patient taking differently: Take 1 tablet by mouth at bedtime.)    . Roflumilast (ZORYVE) 0.3 % CREA Apply 1 Application topically 2 (two) times daily. 60 g 4  . triamcinolone ointment (KENALOG) 0.1 % Apply 1 Application topically 2 (two) times daily. USE 2 times DAILY THEN STOP for 2 weeks to prevent excessive skin thinning 453.6 g 4   No current facility-administered medications for this visit.   Allergies: Allergies  Allergen Reactions  . Sulfa Antibiotics Swelling    Causes swelling on the face.  . Shellfish Allergy Rash and Other (See Comments)    Stomach upset   Social History: Social History   Socioeconomic History  . Marital status: Single    Spouse name: Not on file  . Number of children: 2  . Years of education: Not on file  . Highest education level: Some college, no degree  Occupational History  . Not on file  Tobacco Use  . Smoking status: Every Day    Current packs/day: 0.25    Types: Cigarettes  . Smokeless tobacco: Former  Advertising account planner  . Vaping status: Never Used  Substance and Sexual Activity  . Alcohol use: No  . Drug use: Not Currently    Types: Marijuana    Comment: used to yrs ago  . Sexual activity: Not Currently    Birth control/protection: None  Other Topics Concern  .  Not on file  Social History Narrative   Lives with 2 children   Coffee 2 cups daily   Social Determinants of Health   Financial Resource Strain: Low Risk  (10/31/2022)   Overall Financial Resource Strain (CARDIA)   . Difficulty of Paying Living Expenses: Not hard at all  Food Insecurity: No Food Insecurity (10/31/2022)   Hunger Vital Sign   . Worried About Programme researcher, broadcasting/film/video in the Last Year: Never true   . Ran Out of Food in the Last Year: Never true  Transportation Needs: No Transportation Needs (10/31/2022)   PRAPARE - Transportation   . Lack of Transportation  (Medical): No   . Lack of Transportation (Non-Medical): No  Physical Activity: Insufficiently Active (10/31/2022)   Exercise Vital Sign   . Days of Exercise per Week: 5 days   . Minutes of Exercise per Session: 20 min  Stress: Stress Concern Present (10/31/2022)   [employer] of Occupational Health - Occupational Stress Questionnaire   . Feeling of Stress : To some extent  Social Connections: Moderately Isolated (10/31/2022)   Social Connection and Isolation Panel [NHANES]   . Frequency of Communication with Friends and Family: More than three times a week   . Frequency of Social Gatherings with Friends and Family: Once a week   . Attends Religious Services: 1 to 4 times per year   . Active Member of Clubs or Organizations: No   . Attends Banker Meetings: Never   . Marital Status: Never married   Lives in a ***. Smoking: *** Occupation: ***  Environmental HistorySurveyor, minerals in the house: Copywriter, advertising in the family room: {Blank single:19197::"yes","no"} Carpet in the bedroom: {Blank single:19197::"yes","no"} Heating: {Blank single:19197::"electric","gas","heat pump"} Cooling: {Blank single:19197::"central","window","heat pump"} Pet: {Blank single:19197::"yes ***","no"}  Family History: Family History  Adopted: Yes  Problem Relation Age of Onset  . Hypertension Father   . Hypertension Maternal Aunt   . Hypertension Maternal Uncle   . Hypertension Maternal Grandmother   . Eczema Maternal Grandmother   . Hypertension Maternal Grandfather   . Hypertension Paternal Grandmother   . Hypertension Mother   . Eczema Mother    Problem                               Relation Asthma                                   *** Eczema                                *** Food allergy                          *** Allergic rhino conjunctivitis     ***  Review of Systems  Constitutional:  Negative for appetite change, chills, fever and  unexpected weight change.  HENT:  Negative for congestion and rhinorrhea.   Eyes:  Negative for itching.  Respiratory:  Negative for cough, chest tightness, shortness of breath and wheezing.   Cardiovascular:  Negative for chest pain.  Gastrointestinal:  Negative for abdominal pain.  Genitourinary:  Negative for difficulty urinating.  Skin:  Negative for rash.  Neurological:  Negative for headaches.   Objective: There were no vitals taken for  this visit. There is no height or weight on file to calculate BMI. Physical Exam Vitals and nursing note reviewed.  Constitutional:      Appearance: Normal appearance. She is well-developed.  HENT:     Head: Normocephalic and atraumatic.     Right Ear: Tympanic membrane and external ear normal.     Left Ear: Tympanic membrane and external ear normal.     Nose: Nose normal.     Mouth/Throat:     Mouth: Mucous membranes are moist.     Pharynx: Oropharynx is clear.  Eyes:     Conjunctiva/sclera: Conjunctivae normal.  Cardiovascular:     Rate and Rhythm: Normal rate and regular rhythm.     Heart sounds: Normal heart sounds. No murmur heard.    No friction rub. No gallop.  Pulmonary:     Effort: Pulmonary effort is normal.     Breath sounds: Normal breath sounds. No wheezing, rhonchi or rales.  Musculoskeletal:     Cervical back: Neck supple.  Skin:    General: Skin is warm.     Findings: No rash.  Neurological:     Mental Status: She is alert and oriented to person, place, and time.  Psychiatric:        Behavior: Behavior normal.  The plan was reviewed with the patient/family, and all questions/concerned were addressed.  It was my pleasure to see [name] [name] today and participate in her care. Please feel free to contact me with any questions or concerns.  Sincerely,  Wyline Mood, DO Allergy & Immunology  [facility] office: [phone] [facility] office: [phone]

## 2023-05-28 ENCOUNTER — Other Ambulatory Visit: Payer: Self-pay

## 2023-05-28 ENCOUNTER — Ambulatory Visit (INDEPENDENT_AMBULATORY_CARE_PROVIDER_SITE_OTHER): Payer: Medicare HMO | Admitting: Allergy

## 2023-05-28 ENCOUNTER — Encounter: Payer: Self-pay | Admitting: Allergy

## 2023-05-28 VITALS — BP 118/76 | HR 81 | Temp 98.1°F | Resp 12 | Ht 66.0 in | Wt 223.9 lb

## 2023-05-28 DIAGNOSIS — R768 Other specified abnormal immunological findings in serum: Secondary | ICD-10-CM

## 2023-05-28 DIAGNOSIS — J3089 Other allergic rhinitis: Secondary | ICD-10-CM

## 2023-05-28 DIAGNOSIS — T781XXD Other adverse food reactions, not elsewhere classified, subsequent encounter: Secondary | ICD-10-CM

## 2023-05-28 DIAGNOSIS — Z8709 Personal history of other diseases of the respiratory system: Secondary | ICD-10-CM

## 2023-05-28 DIAGNOSIS — L309 Dermatitis, unspecified: Secondary | ICD-10-CM

## 2023-05-28 MED ORDER — MOMETASONE FUROATE 0.1 % EX OINT: TOPICAL_OINTMENT | Freq: Every day | CUTANEOUS | 1 refills | Status: AC | PRN

## 2023-05-28 NOTE — Patient Instructions (Addendum)
Severe Eczema Medications: Only apply to affected areas that are "rough and red" Body:  Thick, stubborn areas (legs and feet): mometasone 0.1% ointment twice daily as needed to affected areas (up to 3 weeks in a row); stop when clear and can restart as needed for flares.  See below for proper skin care. Use fragrance free and dye free products. No dryer sheets or fabric softener.   Recommend Dupixent injections every 2 weeks. Will start PA process - Tammy will be in touch with you. Do wet wraps as below.  Return for allergy skin testing - don't recommend bloodwork due to your extremely elevated IgE level.  Will make additional recommendations based on results. Make sure you don't take any antihistamines for 3 days before the skin testing appointment. Don't put any lotion on the back and arms on the day of testing.  Plan on being here for 30-60 minutes.   Follow up for skin testing.     Skin care recommendations  Bath time: Always use lukewarm water. AVOID very hot or cold water. Keep bathing time to 5-10 minutes. Do NOT use bubble bath. Use a mild soap and use just enough to wash the dirty areas. Do NOT scrub skin vigorously.  After bathing, pat dry your skin with a towel. Do NOT rub or scrub the skin.  Moisturizers and prescriptions:  ALWAYS apply moisturizers immediately after bathing (within 3 minutes). This helps to lock-in moisture. Use the moisturizer several times a day over the whole body. Good summer moisturizers include: Aveeno, CeraVe, Cetaphil. Good winter moisturizers include: Aquaphor, Vaseline, Cerave, Cetaphil, Eucerin, Vanicream. When using moisturizers along with medications, the moisturizer should be applied about one hour after applying the medication to prevent diluting effect of the medication or moisturize around where you applied the medications. When not using medications, the moisturizer can be continued twice daily as maintenance.  Laundry and  clothing: Avoid laundry products with added color or perfumes. Use unscented hypo-allergenic laundry products such as Tide free, Cheer free & gentle, and All free and clear.  If the skin still seems dry or sensitive, you can try double-rinsing the clothes. Avoid tight or scratchy clothing such as wool. Do not use fabric softeners or dyer sheets.

## 2023-06-04 ENCOUNTER — Ambulatory Visit (INDEPENDENT_AMBULATORY_CARE_PROVIDER_SITE_OTHER): Payer: Medicare HMO | Admitting: Licensed Clinical Social Worker

## 2023-06-04 ENCOUNTER — Ambulatory Visit (HOSPITAL_COMMUNITY): Payer: Medicare HMO | Admitting: Licensed Clinical Social Worker

## 2023-06-04 DIAGNOSIS — F4321 Adjustment disorder with depressed mood: Secondary | ICD-10-CM | POA: Diagnosis not present

## 2023-06-04 NOTE — Progress Notes (Signed)
THERAPIST PROGRESS NOTE   Session Date: 06/04/2023  Session Time: 1603 - 1654 Virtual Visit via Video Note  I connected with [facility] on 06/04/23 at  4:00 PM EST by a video enabled telemedicine application and verified that I am speaking with the correct person using two identifiers.  Location: Patient: Home Provider: Home Office   I discussed the limitations of evaluation and management by telemedicine and the availability of in person appointments. The patient expressed understanding and agreed to proceed.     The patient was advised to call back or seek an in-person evaluation if the symptoms worsen or if the condition fails to improve as anticipated.  I provided 50 minutes of non-face-to-face time during this encounter.  Participation Level: Active  Behavioral Response: CasualAlertEuthymic  Type of Therapy: Individual Therapy  Treatment Goals addressed: - Denis will score less than 5 on the Generalized Anxiety Disorder 7 Scale (GAD-7)  - Report a decrease in anxiety symptoms as evidenced by an overall reduction in anxiety score by a minimum of 25% on the Generalized Anxiety Disorder Scale (GAD-7)  - I just want to be happy again. - Reduce frequency, intensity, and duration of depression symptoms so that daily functioning is improved - Increase coping skills to manage depression and improve ability to perform daily activities  - Veronique will identify cognitive patterns and beliefs that support depression   ProgressTowards Goals: Progressing  Interventions: CBT, Motivational Interviewing, and Supportive  Summary: Audris is a 37 year old African-American female presenting for follow-up appointment in efforts to improve management of depressive and anxious symptoms. Patient reported things having continued to improve over the past two weeks, sharing of continued reduction in irritation specific to eczema, however sharing minor frustrations due to requirement to  discontinue allergy medications for upcoming allergy testing.  Reviewed prior GAD-7 and PHQ-9 scoring's, processing patient's reports of any recent experience of depressive and anxious symptoms, determining of minimal increase in severity of symptoms, therefore not warranting readministration of GAD-7 and PHQ-9.  Further explored previously noted areas of difficulties surrounding the anniversaries of the passing of daughter's father and patient's grandmother, exploring any recent variances in challenges and emotions related to grief and loss. Patient responded well to interventions. Patient continues to meet criteria for Acute adjustment disorder with depressed mood . Patient will continue to benefit from engagement in outpatient therapy due to being the least restrictive service to meet presenting needs.    05/04/2023    8:11 AM 04/20/2023    8:06 AM 04/06/2023    8:22 AM 10/11/2022   11:03 AM  GAD 7 : Generalized Anxiety Score  Nervous, Anxious, on Edge 0 0 1 0  Control/stop worrying 1 1 3  0  Worry too much - different things 0 1 3 2   Trouble relaxing 1 1 1 1   Restless 0 0 0 1  Easily annoyed or irritable 1 1 1 1   Afraid - awful might happen 0 0 0 0  Total GAD 7 Score 3 4 9 5   Anxiety Difficulty Not difficult at all Somewhat difficult Extremely difficult       05/04/2023    8:13 AM 04/20/2023    8:18 AM 04/16/2023   11:05 AM 04/06/2023    8:21 AM 04/06/2023    8:17 AM  Depression screen PHQ 2/9  Decreased Interest 0 1 1  1   Down, Depressed, Hopeless 1 1 1  1   PHQ - 2 Score 1 2 2  2   Altered sleeping 1 1 3  3  Tired, decreased energy 1 1 3  3   Change in appetite 0 0 1  1  Feeling bad or failure about yourself  0 0 0  0  Trouble concentrating 0 0 0  0  Moving slowly or fidgety/restless 0 0 0  0  Suicidal thoughts 0 0 0  0  PHQ-9 Score 3 4 9  9   Difficult doing work/chores Somewhat difficult Somewhat difficult Very difficult Very difficult Very difficult   Patient made moderate  progress on identified treatment goals at this time.   Suicidal/Homicidal: No  Therapist Response: Clinician utilized CBT, MI, and supportive reflection techniques to address identified tx goals. Clinician engaged pt in review of past two weeks, actively processing patient identified areas of stress, further evoking patient's thoughts and feelings surrounding upcoming allergy testing.  Elicited patient's perspective surrounding recent anniversaries of the passing of patient's grandmother and daughters father, supporting patient in reflecting and processing perspective surrounding the grieving process. Therapist provided support and empathy to patient during session.  Plan: Return again in 2 weeks.  Diagnosis: Acute adjustment disorder with depressed mood  Collaboration of Care: Other None deemed necessary at this time.  Patient/Guardian was advised Release of Information must be obtained prior to any record release in order to collaborate their care with an outside provider. Patient/Guardian was advised if they have not already done so to contact the registration department to sign all necessary forms in order for [name] to release information regarding their care.   Consent: Patient/Guardian gives verbal consent for treatment and assignment of benefits for services provided during this visit. Patient/Guardian expressed understanding and agreed to proceed.   [name], MSW, LCSW 06/04/2023,  4:07 PM

## 2023-06-05 ENCOUNTER — Other Ambulatory Visit (HOSPITAL_COMMUNITY): Payer: Self-pay

## 2023-06-05 ENCOUNTER — Other Ambulatory Visit: Payer: Self-pay

## 2023-06-05 ENCOUNTER — Telehealth: Payer: Self-pay | Admitting: *Deleted

## 2023-06-05 MED ORDER — DUPIXENT 300 MG/2ML ~~LOC~~ SOSY
600.0000 mg | PREFILLED_SYRINGE | Freq: Once | SUBCUTANEOUS | 11 refills | Status: AC
  Filled 2023-06-11: qty 4, 14d supply, fill #0
  Filled 2023-07-18: qty 4, 14d supply, fill #1
  Filled 2023-08-29: qty 4, 28d supply, fill #1

## 2023-06-05 MED ORDER — DUPIXENT 300 MG/2ML ~~LOC~~ SOSY: 300.0000 mg | PREFILLED_SYRINGE | SUBCUTANEOUS | 11 refills | Status: DC

## 2023-06-05 NOTE — Addendum Note (Signed)
Addended by: Devoria Glassing on: 06/05/2023 11:44 AM   Modules accepted: Orders

## 2023-06-05 NOTE — Telephone Encounter (Signed)
Called patient and advised approval and submit to [facility] for Dupixent. She will get loading dose in clinic then home admin mt doses. Will reach out once delivery set to make appt to start therapy

## 2023-06-06 ENCOUNTER — Ambulatory Visit (INDEPENDENT_AMBULATORY_CARE_PROVIDER_SITE_OTHER): Payer: Medicare HMO | Admitting: Allergy

## 2023-06-06 ENCOUNTER — Encounter: Payer: Self-pay | Admitting: Allergy

## 2023-06-06 DIAGNOSIS — T781XXD Other adverse food reactions, not elsewhere classified, subsequent encounter: Secondary | ICD-10-CM | POA: Diagnosis not present

## 2023-06-06 DIAGNOSIS — Z8709 Personal history of other diseases of the respiratory system: Secondary | ICD-10-CM

## 2023-06-06 DIAGNOSIS — R768 Other specified abnormal immunological findings in serum: Secondary | ICD-10-CM

## 2023-06-06 DIAGNOSIS — J3089 Other allergic rhinitis: Secondary | ICD-10-CM

## 2023-06-06 DIAGNOSIS — L309 Dermatitis, unspecified: Secondary | ICD-10-CM

## 2023-06-06 NOTE — Progress Notes (Signed)
Skin testing note  RE: [name] [name] MRN: [id number] DOB: 1985-12-31 Date of Office Visit: 06/06/2023  Referring provider: Hoy Register, MD Primary care provider: Hoy Register, MD  Chief Complaint: allergy testing  History of Present Illness: I had the pleasure of seeing [name] [name] for a skin testing visit at the [facility] on 06/06/2023. She is a 37 y.o. female, who is being followed for severe eczema, elevated IgE level, allergic rhinitis, adverse food reaction, history of asthma. Her previous allergy office visit was on 05/28/2023 with Dr. Selena Batten. Today is a skin testing visit.  She is accompanied today by her aunt who provided/contributed to the history.   Discussed the use of AI scribe software for clinical note transcription with the patient, who gave verbal consent to proceed.  She has not yet started wet wraps as part of her skincare routine. However, she has been applying mometasone ointment on rough spots and moisturizing her body. She continues to take allergy medication and Singulair as previously prescribed.  The patient's ankles are of particular concern due to severe eczema.   Assessment and Plan: [name] [name] is a 37 y.o. female with: Severe eczema Elevated IgE level Past history - Severe, chronic, and widespread. Recent exacerbation with partial response to systemic steroids. Long-term use of topical steroids has led to skin damage. No known triggers identified. Currently using triamcinolone and Eucerin for management. Saw dermatology as well. Concerned about allergic triggers. Had extremely elevated IgE.  Today's skin testing positive to trees, mold, dust mites, dog and cockroach. Negative to select foods. Thick, stubborn areas (legs and feet): mometasone 0.1% ointment twice daily as needed to affected areas (up to 3 weeks in a row); stop when clear and can restart as needed for flares. Continue proper skin care. Start Dupixent injections  every 2 weeks. Consent was signed.  Do wet wraps.    Other allergic rhinitis Past history - Chronic, year-round symptoms. Currently managed with Zyrtec and Singulair. No prior AIT.  Today's skin testing positive to trees, mold, dust mites, dog and cockroach. Start environmental control measures as below. Use over the counter antihistamines such as Zyrtec (cetirizine), Claritin (loratadine), Allegra (fexofenadine), or Xyzal (levocetirizine) daily as needed. May take twice a day during allergy flares. May switch antihistamines every few months. Continue Singulair (montelukast) 10mg  daily at night. Consider allergy injections for long term control if above medications do not help the symptoms - handout given.    Other adverse food reactions, not elsewhere classified, subsequent encounter Past history - Shellfish - causes GI symptoms.  Today's skin testing negative to select foods including shellfish. Continue to avoid. Once eczema is more stable will discuss reintroduction.    History of asthma No inhaler use for over 10 years. Monitor symptoms.  Return in about 2 months (around 08/07/2023).  No orders of the defined types were placed in this encounter.  Lab Orders  No laboratory test(s) ordered today    Diagnostics: Skin Testing: Environmental allergy panel and select foods. Today's skin testing positive to trees, mold, dust mites, dog and cockroach. Negative to select foods. Results discussed with patient/family.  Airborne Adult Perc - 06/06/23 1125     Time Antigen Placed 1120    Allergen Manufacturer Waynette Buttery    Location Back    Number of Test 55    Panel 1 Select    1. Control-Buffer 50% Glycerol Negative    2. Control-Histamine 2+    3. Bahia Negative    4.  French Southern Territories Negative    5. Johnson Negative    6. [place] Blue Negative    7. Meadow Fescue Negative    8. Perennial Rye Negative    9. Timothy Negative    10. Ragweed Mix Negative    11. Cocklebur Negative    12.  Plantain,  English Negative    13. Baccharis Negative    14. Dog Fennel Negative    15. Russian Thistle Negative    16. Lamb's Quarters Negative    17. Sheep Sorrell Negative    18. Rough Pigweed Negative    19. Marsh Elder, Rough Negative    20. Mugwort, Common Negative    21. Box, Elder Negative    22. Cedar, red Negative    23. Sweet Gum Negative    24. Pecan Pollen 2+    25. Pine Mix Negative    26. Walnut, Black Pollen --   +/-   27. Red Mulberry Negative    28. Ash Mix Negative    29. Birch Mix Negative    30. Beech American Negative    31. Cottonwood, Guinea-Bissau Negative    32. Hickory, White Negative    33. Maple Mix Negative    34. Oak, [place] Mix Negative    35. Sycamore Eastern Negative    36. Alternaria Alternata Negative    37. Cladosporium Herbarum Negative    38. Aspergillus Mix Negative    39. Penicillium Mix Negative    40. Bipolaris Sorokiniana (Helminthosporium) Negative    41. Drechslera Spicifera (Curvularia) Negative    42. Mucor Plumbeus Negative    43. Fusarium Moniliforme Negative    44. Aureobasidium Pullulans (pullulara) Negative    45. Rhizopus Oryzae Negative    46. Botrytis Cinera Negative    47. Epicoccum Nigrum Negative    48. Phoma Betae Negative    49. Dust Mite Mix 3+    50. Cat Hair 10,000 BAU/ml Negative    51.  Dog Epithelia Negative    52. Mixed Feathers Negative    53. Horse Epithelia Negative    54. Cockroach, German Negative    55. Tobacco Leaf Negative             Intradermal - 06/06/23 1206     Time Antigen Placed 1155    Allergen Manufacturer Waynette Buttery    Location Arm    Number of Test 15    Control Negative    Bahia Negative    French Southern Territories Negative    Johnson Negative    7 Grass Negative    Ragweed Mix Negative    Weed Mix Negative    Tree Mix 2+    Mold 1 2+    Mold 2 Negative    Mold 3 Negative    Mold 4 2+    Cat Negative    Dog 2+    Cockroach 2+             Food Adult Perc - 06/06/23 1100     Time  Antigen Placed 1120    Allergen Manufacturer Greer    Location Back    Number of allergen test 14    1. Peanut Negative    2. Soybean Negative    3. Wheat Negative    4. Sesame Negative    5. Milk, Cow Negative    6. Casein Negative    7. Egg White, Chicken Negative    8. Shellfish Mix Negative    9. Fish Mix Negative  23. Shrimp Negative    24. Crab Negative    25. Lobster Negative    26. Oyster Negative    27. Scallops Negative             Previous notes and tests were reviewed. The plan was reviewed with the patient/family, and all questions/concerned were addressed.  It was my pleasure to see [name] [name] today and participate in her care. Please feel free to contact me with any questions or concerns.  Sincerely,  Wyline Mood, DO Allergy & Immunology  [facility] office: [phone] [name] office: [phone]

## 2023-06-06 NOTE — Patient Instructions (Addendum)
Today's skin testing positive to trees, mold, dust mites, dog and cockroach. Negative to select foods.  Results given.  Environmental allergies Start environmental control measures as below. Use over the counter antihistamines such as Zyrtec (cetirizine), Claritin (loratadine), Allegra (fexofenadine), or Xyzal (levocetirizine) daily as needed. May take twice a day during allergy flares. May switch antihistamines every few months. Continue Singulair (montelukast) 10mg  daily at night. Consider allergy injections for long term control if above medications do not help the symptoms - handout given.   Severe Eczema Medications: Only apply to affected areas that are "rough and red" Body:  Thick, stubborn areas (legs and feet): mometasone 0.1% ointment twice daily as needed to affected areas (up to 3 weeks in a row); stop when clear and can restart as needed for flares. Continue proper skin care. Use fragrance free and dye free products. No dryer sheets or fabric softener.   Start Dupixent injections every 2 weeks. Do wet wraps.   Food  Continue to avoid shellfish for now.   Follow up in 2 months or sooner if needed.   Skin care recommendations  Bath time: Always use lukewarm water. AVOID very hot or cold water. Keep bathing time to 5-10 minutes. Do NOT use bubble bath. Use a mild soap and use just enough to wash the dirty areas. Do NOT scrub skin vigorously.  After bathing, pat dry your skin with a towel. Do NOT rub or scrub the skin.  Moisturizers and prescriptions:  ALWAYS apply moisturizers immediately after bathing (within 3 minutes). This helps to lock-in moisture. Use the moisturizer several times a day over the whole body. Good summer moisturizers include: Aveeno, CeraVe, Cetaphil. Good winter moisturizers include: Aquaphor, Vaseline, Cerave, Cetaphil, Eucerin, Vanicream. When using moisturizers along with medications, the moisturizer should be applied about one hour after  applying the medication to prevent diluting effect of the medication or moisturize around where you applied the medications. When not using medications, the moisturizer can be continued twice daily as maintenance.  Laundry and clothing: Avoid laundry products with added color or perfumes. Use unscented hypo-allergenic laundry products such as Tide free, Cheer free & gentle, and All free and clear.  If the skin still seems dry or sensitive, you can try double-rinsing the clothes. Avoid tight or scratchy clothing such as wool. Do not use fabric softeners or dyer sheets.  Reducing Pollen Exposure Pollen seasons: trees (spring), grass (summer) and ragweed/weeds (fall). Keep windows closed in your home and car to lower pollen exposure.  Install air conditioning in the bedroom and throughout the house if possible.  Avoid going out in dry windy days - especially early morning. Pollen counts are highest between 5 - 10 AM and on dry, hot and windy days.  Save outside activities for late afternoon or after a heavy rain, when pollen levels are lower.  Avoid mowing of grass if you have grass pollen allergy. Be aware that pollen can also be transported indoors on people and pets.  Dry your clothes in an automatic dryer rather than hanging them outside where they might collect pollen.  Rinse hair and eyes before bedtime.  Control of House Dust Mite Allergen Dust mite allergens are a common trigger of allergy and asthma symptoms. While they can be found throughout the house, these microscopic creatures thrive in warm, humid environments such as bedding, upholstered furniture and carpeting. Because so much time is spent in the bedroom, it is essential to reduce mite levels there.  Encase pillows, mattresses, and  box springs in special allergen-proof fabric covers or airtight, zippered plastic covers.  Bedding should be washed weekly in hot water (130 F) and dried in a hot dryer. Allergen-proof covers are  available for comforters and pillows that can't be regularly washed.  Wash the allergy-proof covers every few months. Minimize clutter in the bedroom. Keep pets out of the bedroom.  Keep humidity less than 50% by using a dehumidifier or air conditioning. You can buy a humidity measuring device called a hygrometer to monitor this.  If possible, replace carpets with hardwood, linoleum, or washable area rugs. If that's not possible, vacuum frequently with a vacuum that has a HEPA filter. Remove all upholstered furniture and non-washable window drapes from the bedroom. Remove all non-washable stuffed toys from the bedroom.  Wash stuffed toys weekly.  Pet Allergen Avoidance: Contrary to popular opinion, there are no "hypoallergenic" breeds of dogs or cats. That is because people are not allergic to an animal's hair, but to an allergen found in the animal's saliva, dander (dead skin flakes) or urine. Pet allergy symptoms typically occur within minutes. For some people, symptoms can build up and become most severe 8 to 12 hours after contact with the animal. People with severe allergies can experience reactions in public places if dander has been transported on the pet owners' clothing. Keeping an animal outdoors is only a partial solution, since homes with pets in the yard still have higher concentrations of animal allergens. Before getting a pet, ask your allergist to determine if you are allergic to animals. If your pet is already considered part of your family, try to minimize contact and keep the pet out of the bedroom and other rooms where you spend a great deal of time. As with dust mites, vacuum carpets often or replace carpet with a hardwood floor, tile or linoleum. High-efficiency particulate air (HEPA) cleaners can reduce allergen levels over time. While dander and saliva are the source of cat and dog allergens, urine is the source of allergens from rabbits, hamsters, mice and [place] pigs; so ask a  non-allergic family member to clean the animal's cage. If you have a pet allergy, talk to your allergist about the potential for allergy immunotherapy (allergy shots). This strategy can often provide long-term relief.  Mold Control Mold and fungi can grow on a variety of surfaces provided certain temperature and moisture conditions exist.  Outdoor molds grow on plants, decaying vegetation and soil. The major outdoor mold, Alternaria and Cladosporium, are found in very high numbers during hot and dry conditions. Generally, a late summer - fall peak is seen for common outdoor fungal spores. Rain will temporarily lower outdoor mold spore count, but counts rise rapidly when the rainy period ends. The most important indoor molds are Aspergillus and Penicillium. Dark, humid and poorly ventilated basements are ideal sites for mold growth. The next most common sites of mold growth are the bathroom and the kitchen. Outdoor (Seasonal) Mold Control Use air conditioning and keep windows closed. Avoid exposure to decaying vegetation. Avoid leaf raking. Avoid grain handling. Consider wearing a face mask if working in moldy areas.  Indoor (Perennial) Mold Control  Maintain humidity below 50%. Get rid of mold growth on hard surfaces with water, detergent and, if necessary, 5% bleach (do not mix with other cleaners). Then dry the area completely. If mold covers an area more than 10 square feet, consider hiring an indoor environmental professional. For clothing, washing with soap and water is best. If moldy items  cannot be cleaned and dried, throw them away. Remove sources e.g. contaminated carpets. Repair and seal leaking roofs or pipes. Using dehumidifiers in damp basements may be helpful, but empty the water and clean units regularly to prevent mildew from forming. All rooms, especially basements, bathrooms and kitchens, require ventilation and cleaning to deter mold and mildew growth. Avoid carpeting on concrete  or damp floors, and storing items in damp areas.

## 2023-06-11 ENCOUNTER — Other Ambulatory Visit: Payer: Self-pay

## 2023-06-11 ENCOUNTER — Other Ambulatory Visit (HOSPITAL_COMMUNITY): Payer: Self-pay

## 2023-06-11 NOTE — Progress Notes (Signed)
Specialty Pharmacy Initiation Note   [name] [name] is a 37 y.o. female who will be followed by the specialty pharmacy service for RxSp Atopic Dermatitis    Review of administration, indication, effectiveness, safety, potential side effects, storage/disposable, and missed dose instructions occurred today for patient's specialty medication(s) Dupilumab (Dupixent)     Patient/Caregiver did not have any additional questions or concerns.   Patient's therapy is appropriate to: Initiate    Goals Addressed             This Visit's Progress    Reduce signs and symptoms       Patient is initiating therapy. Patient will maintain adherence         Otto Herb Specialty Pharmacist

## 2023-06-11 NOTE — Progress Notes (Signed)
Specialty Pharmacy Initial Fill Coordination Note  Nai Magazine is a 37 y.o. female contacted today regarding initial fill of specialty medication(s) Dupilumab (Dupixent)   Patient requested Courier to Provider Office   Delivery date: 06/18/23   Verified address: [facility] [address] [place], [place] [address]   Medication will be filled on 06/15/23.   Patient is aware of $0 copayment.

## 2023-06-15 ENCOUNTER — Other Ambulatory Visit: Payer: Self-pay

## 2023-06-21 ENCOUNTER — Ambulatory Visit (INDEPENDENT_AMBULATORY_CARE_PROVIDER_SITE_OTHER): Payer: Medicare HMO | Admitting: Licensed Clinical Social Worker

## 2023-06-21 DIAGNOSIS — Z91199 Patient's noncompliance with other medical treatment and regimen due to unspecified reason: Secondary | ICD-10-CM

## 2023-06-21 NOTE — Progress Notes (Signed)
 THERAPIST PROGRESS NOTE   Session Date: 06/21/2023  Session Time: 1000  Patient no-showed today's appointment; appointment was for follow up therapy.   [name], MSW, LCSW 06/21/2023,  10:07 AM

## 2023-06-21 NOTE — Telephone Encounter (Signed)
 L/m for patient to contact [place] clinic to schedule loading dose in clinic then can home admin after

## 2023-06-23 ENCOUNTER — Other Ambulatory Visit: Payer: Self-pay | Admitting: Family Medicine

## 2023-06-23 DIAGNOSIS — J309 Allergic rhinitis, unspecified: Secondary | ICD-10-CM

## 2023-06-27 ENCOUNTER — Other Ambulatory Visit: Payer: Self-pay | Admitting: Family Medicine

## 2023-06-27 ENCOUNTER — Ambulatory Visit: Payer: No Typology Code available for payment source | Admitting: Dermatology

## 2023-06-27 DIAGNOSIS — J309 Allergic rhinitis, unspecified: Secondary | ICD-10-CM

## 2023-06-28 ENCOUNTER — Other Ambulatory Visit: Payer: Self-pay | Admitting: Family Medicine

## 2023-06-28 DIAGNOSIS — I10 Essential (primary) hypertension: Secondary | ICD-10-CM

## 2023-06-29 ENCOUNTER — Other Ambulatory Visit (HOSPITAL_COMMUNITY): Payer: Self-pay

## 2023-07-02 ENCOUNTER — Ambulatory Visit (INDEPENDENT_AMBULATORY_CARE_PROVIDER_SITE_OTHER): Payer: Medicaid Other | Admitting: Licensed Clinical Social Worker

## 2023-07-02 DIAGNOSIS — Z91199 Patient's noncompliance with other medical treatment and regimen due to unspecified reason: Secondary | ICD-10-CM

## 2023-07-02 NOTE — Progress Notes (Signed)
 THERAPIST PROGRESS NOTE   Session Date: 07/02/2023  Session Time: 1100  [name] [name]     Clinician attempted to connect with patient for scheduled appointment via MyChart video, sending text request x3 with no response.    Attempt 1: Text: 1110   Attempt 2: Text: 1112    Attempt 3: Text: 1114   Left video chat open until:  1116     Per [facility] policy, after multiple attempts to reach patient unsuccessfully at appointed time, visit will be coded as a no show.  [name] [name], MSW, LCSW 07/02/2023,  11:15 AM

## 2023-07-16 ENCOUNTER — Telehealth (HOSPITAL_COMMUNITY): Payer: Self-pay | Admitting: Licensed Clinical Social Worker

## 2023-07-16 ENCOUNTER — Ambulatory Visit (INDEPENDENT_AMBULATORY_CARE_PROVIDER_SITE_OTHER): Payer: Medicaid Other | Admitting: Licensed Clinical Social Worker

## 2023-07-16 ENCOUNTER — Encounter (HOSPITAL_COMMUNITY): Payer: Self-pay | Admitting: Licensed Clinical Social Worker

## 2023-07-16 DIAGNOSIS — Z91199 Patient's noncompliance with other medical treatment and regimen due to unspecified reason: Secondary | ICD-10-CM

## 2023-07-16 NOTE — Telephone Encounter (Signed)
07/16/23 11:44am Discharge letter sent on 07/17/23 pt non-compliant with keeping appts - certified mail  [phone]

## 2023-07-16 NOTE — Progress Notes (Signed)
THERAPIST PROGRESS NOTE   Session Date: 07/16/2023  Session Time: 1100  [facility]    Clinician attempted to connect with patient for scheduled appointment via MyChart video, sending text request x3 with no response.    Attempt 1: Text: 1107   Attempt 2: Text: 1110   Attempt 3: Text: 1114   Left video chat open until:  1116     Per [facility] policy, after multiple attempts to reach patient unsuccessfully at appointed time, visit will be coded as a no show. Due to practice policy of 3 no show appointments, (1/02,1/13,1/27) pt will be released from practice.  [name], MSW, LCSW 07/16/2023,  11:16 AM

## 2023-07-18 ENCOUNTER — Other Ambulatory Visit: Payer: Self-pay

## 2023-07-19 ENCOUNTER — Other Ambulatory Visit: Payer: Self-pay

## 2023-07-30 ENCOUNTER — Ambulatory Visit (HOSPITAL_COMMUNITY): Payer: Medicare HMO | Admitting: Licensed Clinical Social Worker

## 2023-08-08 ENCOUNTER — Ambulatory Visit: Payer: No Typology Code available for payment source | Admitting: Allergy

## 2023-08-13 ENCOUNTER — Ambulatory Visit (HOSPITAL_COMMUNITY): Payer: Medicare HMO | Admitting: Licensed Clinical Social Worker

## 2023-08-14 ENCOUNTER — Other Ambulatory Visit (HOSPITAL_COMMUNITY): Payer: Self-pay

## 2023-08-14 NOTE — Patient Instructions (Incomplete)
 Allergic rhinitis-not well controlled skin testing on 06/06/23 positive to trees, mold, dust mites, dog and cockroach. Continue environmental control measures as below. Use over the counter antihistamines such as Zyrtec (cetirizine), Claritin (loratadine), Allegra (fexofenadine), or Xyzal (levocetirizine) daily as needed. May take twice a day during allergy flares. May switch antihistamines every few months. Continue Singulair (montelukast) 10mg  daily at night. Consider allergy injections for long term control if above medications do not help the symptoms - handout given.  Severe Eczema-not well controlled Medications: Only apply to affected areas that are "rough and red" Body:  Thick, stubborn areas (legs and feet): mometasone 0.1% ointment twice daily as needed to affected areas (up to 3 weeks in a row); stop when clear and can restart as needed for flares. Continue proper skin care. Use fragrance free and dye free products. No dryer sheets or fabric softener.   Start Dupixent injections every 2 weeks. Loading dose given today in office Do wet wraps.   Adverse food reaction Skin testing on 06/06/23 negative to select foods. Continue to avoid shellfish for now.   Follow up in 3 months or sooner if needed.   Skin care recommendations  Bath time: Always use lukewarm water. AVOID very hot or cold water. Keep bathing time to 5-10 minutes. Do NOT use bubble bath. Use a mild soap and use just enough to wash the dirty areas. Do NOT scrub skin vigorously.  After bathing, pat dry your skin with a towel. Do NOT rub or scrub the skin.  Moisturizers and prescriptions:  ALWAYS apply moisturizers immediately after bathing (within 3 minutes). This helps to lock-in moisture. Use the moisturizer several times a day over the whole body. Good summer moisturizers include: Aveeno, CeraVe, Cetaphil. Good winter moisturizers include: Aquaphor, Vaseline, Cerave, Cetaphil, Eucerin, Vanicream. When  using moisturizers along with medications, the moisturizer should be applied about one hour after applying the medication to prevent diluting effect of the medication or moisturize around where you applied the medications. When not using medications, the moisturizer can be continued twice daily as maintenance.  Laundry and clothing: Avoid laundry products with added color or perfumes. Use unscented hypo-allergenic laundry products such as Tide free, Cheer free & gentle, and All free and clear.  If the skin still seems dry or sensitive, you can try double-rinsing the clothes. Avoid tight or scratchy clothing such as wool. Do not use fabric softeners or dyer sheets.  Reducing Pollen Exposure Pollen seasons: trees (spring), grass (summer) and ragweed/weeds (fall). Keep windows closed in your home and car to lower pollen exposure.  Install air conditioning in the bedroom and throughout the house if possible.  Avoid going out in dry windy days - especially early morning. Pollen counts are highest between 5 - 10 AM and on dry, hot and windy days.  Save outside activities for late afternoon or after a heavy rain, when pollen levels are lower.  Avoid mowing of grass if you have grass pollen allergy. Be aware that pollen can also be transported indoors on people and pets.  Dry your clothes in an automatic dryer rather than hanging them outside where they might collect pollen.  Rinse hair and eyes before bedtime.  Control of House Dust Mite Allergen Dust mite allergens are a common trigger of allergy and asthma symptoms. While they can be found throughout the house, these microscopic creatures thrive in warm, humid environments such as bedding, upholstered furniture and carpeting. Because so much time is spent in the bedroom, it is  essential to reduce mite levels there.  Encase pillows, mattresses, and box springs in special allergen-proof fabric covers or airtight, zippered plastic covers.  Bedding  should be washed weekly in hot water (130 F) and dried in a hot dryer. Allergen-proof covers are available for comforters and pillows that can't be regularly washed.  Wash the allergy-proof covers every few months. Minimize clutter in the bedroom. Keep pets out of the bedroom.  Keep humidity less than 50% by using a dehumidifier or air conditioning. You can buy a humidity measuring device called a hygrometer to monitor this.  If possible, replace carpets with hardwood, linoleum, or washable area rugs. If that's not possible, vacuum frequently with a vacuum that has a HEPA filter. Remove all upholstered furniture and non-washable window drapes from the bedroom. Remove all non-washable stuffed toys from the bedroom.  Wash stuffed toys weekly.  Pet Allergen Avoidance: Contrary to popular opinion, there are no "hypoallergenic" breeds of dogs or cats. That is because people are not allergic to an animal's hair, but to an allergen found in the animal's saliva, dander (dead skin flakes) or urine. Pet allergy symptoms typically occur within minutes. For some people, symptoms can build up and become most severe 8 to 12 hours after contact with the animal. People with severe allergies can experience reactions in public places if dander has been transported on the pet owners' clothing. Keeping an animal outdoors is only a partial solution, since homes with pets in the yard still have higher concentrations of animal allergens. Before getting a pet, ask your allergist to determine if you are allergic to animals. If your pet is already considered part of your family, try to minimize contact and keep the pet out of the bedroom and other rooms where you spend a great deal of time. As with dust mites, vacuum carpets often or replace carpet with a hardwood floor, tile or linoleum. High-efficiency particulate air (HEPA) cleaners can reduce allergen levels over time. While dander and saliva are the source of cat and dog  allergens, urine is the source of allergens from rabbits, hamsters, mice and [place] pigs; so ask a non-allergic family member to clean the animal's cage. If you have a pet allergy, talk to your allergist about the potential for allergy immunotherapy (allergy shots). This strategy can often provide long-term relief.  Mold Control Mold and fungi can grow on a variety of surfaces provided certain temperature and moisture conditions exist.  Outdoor molds grow on plants, decaying vegetation and soil. The major outdoor mold, Alternaria and Cladosporium, are found in very high numbers during hot and dry conditions. Generally, a late summer - fall peak is seen for common outdoor fungal spores. Rain will temporarily lower outdoor mold spore count, but counts rise rapidly when the rainy period ends. The most important indoor molds are Aspergillus and Penicillium. Dark, humid and poorly ventilated basements are ideal sites for mold growth. The next most common sites of mold growth are the bathroom and the kitchen. Outdoor (Seasonal) Mold Control Use air conditioning and keep windows closed. Avoid exposure to decaying vegetation. Avoid leaf raking. Avoid grain handling. Consider wearing a face mask if working in moldy areas.  Indoor (Perennial) Mold Control  Maintain humidity below 50%. Get rid of mold growth on hard surfaces with water, detergent and, if necessary, 5% bleach (do not mix with other cleaners). Then dry the area completely. If mold covers an area more than 10 square feet, consider hiring an Gaffer. For  clothing, washing with soap and water is best. If moldy items cannot be cleaned and dried, throw them away. Remove sources e.g. contaminated carpets. Repair and seal leaking roofs or pipes. Using dehumidifiers in damp basements may be helpful, but empty the water and clean units regularly to prevent mildew from forming. All rooms, especially basements, bathrooms and  kitchens, require ventilation and cleaning to deter mold and mildew growth. Avoid carpeting on concrete or damp floors, and storing items in damp areas.

## 2023-08-15 ENCOUNTER — Ambulatory Visit (INDEPENDENT_AMBULATORY_CARE_PROVIDER_SITE_OTHER): Payer: No Typology Code available for payment source | Admitting: Family

## 2023-08-15 ENCOUNTER — Other Ambulatory Visit: Payer: Self-pay

## 2023-08-15 ENCOUNTER — Encounter: Payer: Self-pay | Admitting: Family

## 2023-08-15 VITALS — BP 118/70 | HR 90 | Temp 98.4°F | Resp 17

## 2023-08-15 DIAGNOSIS — L309 Dermatitis, unspecified: Secondary | ICD-10-CM | POA: Diagnosis not present

## 2023-08-15 DIAGNOSIS — T781XXD Other adverse food reactions, not elsewhere classified, subsequent encounter: Secondary | ICD-10-CM | POA: Diagnosis not present

## 2023-08-15 DIAGNOSIS — R768 Other specified abnormal immunological findings in serum: Secondary | ICD-10-CM | POA: Diagnosis not present

## 2023-08-15 DIAGNOSIS — Z8709 Personal history of other diseases of the respiratory system: Secondary | ICD-10-CM

## 2023-08-15 DIAGNOSIS — J3089 Other allergic rhinitis: Secondary | ICD-10-CM

## 2023-08-15 MED ORDER — DUPILUMAB 300 MG/2ML ~~LOC~~ SOSY
600.0000 mg | PREFILLED_SYRINGE | Freq: Once | SUBCUTANEOUS | Status: AC
Start: 2023-08-15 — End: 2023-08-15
  Administered 2023-08-15: 600 mg via SUBCUTANEOUS

## 2023-08-15 NOTE — Progress Notes (Signed)
 [address] [place] [address] Dept: [phone]  FOLLOW UP NOTE  Patient ID: [name] [name], female    DOB: 1986/04/18  Age: 37 y.o. MRN: [id number] Date of Office Visit: 08/15/2023  Assessment  Chief Complaint: Eczema, Allergic Rhinitis , and Pruritus  HPI [name] [name] is a 38 year old female who presents today for follow-up of severe eczema, elevated IgE level, allergic rhinitis, adverse food reactions, and history of asthma.  She was last seen on June 06, 2023 by Dr. Selena Batten.  She denies any new diagnosis or surgery since her last office visit.  Her aunt is here with her today.  Severe eczema is reported as still the same.  She has not yet received her Dupixent injection.  She reports that she called our office and was put on hold.  Also during that time she lost her grandfather and then there was bad weather.  She is interested in starting Dupixent today.  She reports essentially she has eczema from the neck down along with areas behind her ears and on her eyelids.  She has not been treated for any skin infections since we last saw her.  Nothing seems to work.  She has triamcinolone and mometasone to use as needed.  Allergic rhinitis: She reports that she has clear rhinorrhea, nasal congestion, and postnasal drip.  She has not been treated for any sinus infections since we last saw her.  She currently takes Zyrtec daily and Singulair daily.  She has not picked up her refill of Flonase nasal spray, but she normally takes this also.  Adverse food reactions: She continues to avoid shellfish without any accidental ingestion.  History of asthma: She has not used an inhaler for over 10 years.  She denies cough, wheeze, tightness in chest, shortness of breath, and nocturnal awakenings due to breathing problems.  Since her last office visit she has not required any systemic steroids or made any trips to the emergency room or urgent care due to breathing problems.   Drug Allergies:   Allergies  Allergen Reactions   Sulfa Antibiotics Swelling    Causes swelling on the face.   Shellfish Allergy Rash and Other (See Comments)    Stomach upset    Review of Systems: Negative except as per HPI   Physical Exam: BP 118/70 (BP Location: Left Arm, Patient Position: Sitting, Cuff Size: Normal)   Pulse 90   Temp 98.4 F (36.9 C) (Temporal)   Resp 17   SpO2 99%    Physical Exam Exam conducted with a chaperone present (aunt present).  Constitutional:      Appearance: Normal appearance.  HENT:     Head: Normocephalic and atraumatic.     Comments: Pharynx normal, eyes normal, ears normal, nose: bilateral lower turbinates mildly edematous with no drainage noted. Transverse nasal crease present    Right Ear: Tympanic membrane, ear canal and external ear normal.     Left Ear: Tympanic membrane, ear canal and external ear normal.     Mouth/Throat:     Mouth: Mucous membranes are moist.     Pharynx: Oropharynx is clear.  Eyes:     Conjunctiva/sclera: Conjunctivae normal.  Cardiovascular:     Rate and Rhythm: Regular rhythm.     Heart sounds: Normal heart sounds.  Pulmonary:     Effort: Pulmonary effort is normal.     Breath sounds: Normal breath sounds.     Comments: Lungs clear to ascultation Musculoskeletal:     Cervical back: Neck  supple.  Skin:    General: Skin is warm.     Comments: Leathery/hyperpigmented areas with small scabbing and no erythema, oozing or bleeding noted on bilateral lower legs and ankles. Leathery/hyperpigmented areas noted on bilateral lower arms and hands. Hyperpigmented areas noted on bilateral upper eye lids  Neurological:     Mental Status: She is alert and oriented to person, place, and time.  Psychiatric:        Mood and Affect: Mood normal.        Behavior: Behavior normal.        Thought Content: Thought content normal.        Judgment: Judgment normal.     Diagnostics: None  Assessment and Plan: 1. Severe eczema   2.  Other allergic rhinitis   3. Other adverse food reactions, not elsewhere classified, subsequent encounter   4. History of asthma   5. Elevated IgE level     Meds ordered this encounter  Medications   dupilumab (DUPIXENT) prefilled syringe 600 mg    Patient Instructions  Allergic rhinitis-not well controlled skin testing on 06/06/23 positive to trees, mold, dust mites, dog and cockroach. Continue environmental control measures as below. Use over the counter antihistamines such as Zyrtec (cetirizine), Claritin (loratadine), Allegra (fexofenadine), or Xyzal (levocetirizine) daily as needed. May take twice a day during allergy flares. May switch antihistamines every few months. Continue Singulair (montelukast) 10mg  daily at night. Consider allergy injections for long term control if above medications do not help the symptoms - handout given.  Severe Eczema-not well controlled Medications: Only apply to affected areas that are "rough and red" Body:  Thick, stubborn areas (legs and feet): mometasone 0.1% ointment twice daily as needed to affected areas (up to 3 weeks in a row); stop when clear and can restart as needed for flares. Continue proper skin care. Use fragrance free and dye free products. No dryer sheets or fabric softener.   Start Dupixent injections every 2 weeks. Loading dose given today in office Do wet wraps.   Adverse food reaction Skin testing on 06/06/23 negative to select foods. Continue to avoid shellfish for now.   Follow up in 3 months or sooner if needed.   Skin care recommendations  Bath time: Always use lukewarm water. AVOID very hot or cold water. Keep bathing time to 5-10 minutes. Do NOT use bubble bath. Use a mild soap and use just enough to wash the dirty areas. Do NOT scrub skin vigorously.  After bathing, pat dry your skin with a towel. Do NOT rub or scrub the skin.  Moisturizers and prescriptions:  ALWAYS apply moisturizers immediately after  bathing (within 3 minutes). This helps to lock-in moisture. Use the moisturizer several times a day over the whole body. Good summer moisturizers include: Aveeno, CeraVe, Cetaphil. Good winter moisturizers include: Aquaphor, Vaseline, Cerave, Cetaphil, Eucerin, Vanicream. When using moisturizers along with medications, the moisturizer should be applied about one hour after applying the medication to prevent diluting effect of the medication or moisturize around where you applied the medications. When not using medications, the moisturizer can be continued twice daily as maintenance.  Laundry and clothing: Avoid laundry products with added color or perfumes. Use unscented hypo-allergenic laundry products such as Tide free, Cheer free & gentle, and All free and clear.  If the skin still seems dry or sensitive, you can try double-rinsing the clothes. Avoid tight or scratchy clothing such as wool. Do not use fabric softeners or dyer sheets.  Reducing Pollen  Exposure Pollen seasons: trees (spring), grass (summer) and ragweed/weeds (fall). Keep windows closed in your home and car to lower pollen exposure.  Install air conditioning in the bedroom and throughout the house if possible.  Avoid going out in dry windy days - especially early morning. Pollen counts are highest between 5 - 10 AM and on dry, hot and windy days.  Save outside activities for late afternoon or after a heavy rain, when pollen levels are lower.  Avoid mowing of grass if you have grass pollen allergy. Be aware that pollen can also be transported indoors on people and pets.  Dry your clothes in an automatic dryer rather than hanging them outside where they might collect pollen.  Rinse hair and eyes before bedtime.  Control of House Dust Mite Allergen Dust mite allergens are a common trigger of allergy and asthma symptoms. While they can be found throughout the house, these microscopic creatures thrive in warm, humid environments  such as bedding, upholstered furniture and carpeting. Because so much time is spent in the bedroom, it is essential to reduce mite levels there.  Encase pillows, mattresses, and box springs in special allergen-proof fabric covers or airtight, zippered plastic covers.  Bedding should be washed weekly in hot water (130 F) and dried in a hot dryer. Allergen-proof covers are available for comforters and pillows that can't be regularly washed.  Wash the allergy-proof covers every few months. Minimize clutter in the bedroom. Keep pets out of the bedroom.  Keep humidity less than 50% by using a dehumidifier or air conditioning. You can buy a humidity measuring device called a hygrometer to monitor this.  If possible, replace carpets with hardwood, linoleum, or washable area rugs. If that's not possible, vacuum frequently with a vacuum that has a HEPA filter. Remove all upholstered furniture and non-washable window drapes from the bedroom. Remove all non-washable stuffed toys from the bedroom.  Wash stuffed toys weekly.  Pet Allergen Avoidance: Contrary to popular opinion, there are no "hypoallergenic" breeds of dogs or cats. That is because people are not allergic to an animal's hair, but to an allergen found in the animal's saliva, dander (dead skin flakes) or urine. Pet allergy symptoms typically occur within minutes. For some people, symptoms can build up and become most severe 8 to 12 hours after contact with the animal. People with severe allergies can experience reactions in public places if dander has been transported on the pet owners' clothing. Keeping an animal outdoors is only a partial solution, since homes with pets in the yard still have higher concentrations of animal allergens. Before getting a pet, ask your allergist to determine if you are allergic to animals. If your pet is already considered part of your family, try to minimize contact and keep the pet out of the bedroom and other rooms  where you spend a great deal of time. As with dust mites, vacuum carpets often or replace carpet with a hardwood floor, tile or linoleum. High-efficiency particulate air (HEPA) cleaners can reduce allergen levels over time. While dander and saliva are the source of cat and dog allergens, urine is the source of allergens from rabbits, hamsters, mice and [place] pigs; so ask a non-allergic family member to clean the animal's cage. If you have a pet allergy, talk to your allergist about the potential for allergy immunotherapy (allergy shots). This strategy can often provide long-term relief.  Mold Control Mold and fungi can grow on a variety of surfaces provided certain temperature and moisture  conditions exist.  Outdoor molds grow on plants, decaying vegetation and soil. The major outdoor mold, Alternaria and Cladosporium, are found in very high numbers during hot and dry conditions. Generally, a late summer - fall peak is seen for common outdoor fungal spores. Rain will temporarily lower outdoor mold spore count, but counts rise rapidly when the rainy period ends. The most important indoor molds are Aspergillus and Penicillium. Dark, humid and poorly ventilated basements are ideal sites for mold growth. The next most common sites of mold growth are the bathroom and the kitchen. Outdoor (Seasonal) Mold Control Use air conditioning and keep windows closed. Avoid exposure to decaying vegetation. Avoid leaf raking. Avoid grain handling. Consider wearing a face mask if working in moldy areas.  Indoor (Perennial) Mold Control  Maintain humidity below 50%. Get rid of mold growth on hard surfaces with water, detergent and, if necessary, 5% bleach (do not mix with other cleaners). Then dry the area completely. If mold covers an area more than 10 square feet, consider hiring an indoor environmental professional. For clothing, washing with soap and water is best. If moldy items cannot be cleaned and dried,  throw them away. Remove sources e.g. contaminated carpets. Repair and seal leaking roofs or pipes. Using dehumidifiers in damp basements may be helpful, but empty the water and clean units regularly to prevent mildew from forming. All rooms, especially basements, bathrooms and kitchens, require ventilation and cleaning to deter mold and mildew growth. Avoid carpeting on concrete or damp floors, and storing items in damp areas.  Return in about 3 months (around 11/12/2023), or if symptoms worsen or fail to improve.    Thank you for the opportunity to care for this patient.  Please do not hesitate to contact me with questions.  Nehemiah Settle, FNP [facility]

## 2023-08-15 NOTE — Progress Notes (Signed)
 Immunotherapy   Patient Details  Name: [name] [name] MRN: [id number] Date of Birth: 24-Apr-1986  08/15/2023  [facility] started injections for Eczema. Patient received a loading dose of 600 mg Dupixent. Patient waited in office 15 minutes with no problems.   Frequency: every 14 days Consent signed and patient instructions given.   Dub Mikes 08/15/2023, 11:24 AM

## 2023-08-17 ENCOUNTER — Other Ambulatory Visit: Payer: Self-pay

## 2023-08-20 ENCOUNTER — Other Ambulatory Visit (HOSPITAL_COMMUNITY): Payer: Self-pay

## 2023-08-27 ENCOUNTER — Ambulatory Visit (HOSPITAL_COMMUNITY): Payer: Medicare HMO | Admitting: Licensed Clinical Social Worker

## 2023-08-29 ENCOUNTER — Ambulatory Visit: Payer: No Typology Code available for payment source

## 2023-08-29 ENCOUNTER — Other Ambulatory Visit: Payer: Self-pay | Admitting: Pharmacy Technician

## 2023-08-29 ENCOUNTER — Other Ambulatory Visit: Payer: Self-pay

## 2023-08-29 NOTE — Progress Notes (Signed)
 Specialty Pharmacy Refill Coordination Note  [name] [name] is a 38 y.o. female contacted today regarding refills of specialty medication(s) Dupilumab (Dupixent)   Patient requested Courier to Provider Office   Delivery date: 08/30/23   Verified address: [facility] [address]   Medication will be filled on 08/29/23.

## 2023-08-31 ENCOUNTER — Ambulatory Visit (INDEPENDENT_AMBULATORY_CARE_PROVIDER_SITE_OTHER): Admitting: *Deleted

## 2023-08-31 DIAGNOSIS — L209 Atopic dermatitis, unspecified: Secondary | ICD-10-CM | POA: Diagnosis not present

## 2023-08-31 MED ORDER — DUPILUMAB 300 MG/2ML ~~LOC~~ SOSY
300.0000 mg | PREFILLED_SYRINGE | SUBCUTANEOUS | Status: AC
Start: 2023-08-31 — End: ?
  Administered 2023-08-31: 300 mg via SUBCUTANEOUS

## 2023-09-10 ENCOUNTER — Ambulatory Visit (HOSPITAL_COMMUNITY): Payer: Medicare HMO | Admitting: Licensed Clinical Social Worker

## 2023-09-14 ENCOUNTER — Ambulatory Visit

## 2023-09-18 ENCOUNTER — Other Ambulatory Visit (HOSPITAL_COMMUNITY): Payer: Self-pay

## 2023-10-01 ENCOUNTER — Other Ambulatory Visit: Payer: Self-pay

## 2023-10-05 ENCOUNTER — Other Ambulatory Visit: Payer: Self-pay

## 2023-10-08 ENCOUNTER — Other Ambulatory Visit: Payer: Self-pay

## 2023-10-10 ENCOUNTER — Other Ambulatory Visit: Payer: Self-pay

## 2023-10-18 ENCOUNTER — Other Ambulatory Visit: Payer: Self-pay | Admitting: Dermatology

## 2023-11-06 ENCOUNTER — Ambulatory Visit: Payer: 59 | Attending: Family Medicine

## 2023-11-06 VITALS — Ht 67.0 in | Wt 200.0 lb

## 2023-11-06 DIAGNOSIS — Z0000 Encounter for general adult medical examination without abnormal findings: Secondary | ICD-10-CM | POA: Diagnosis not present

## 2023-11-06 NOTE — Progress Notes (Addendum)
 Because this visit was a virtual/telehealth visit,  certain criteria was not obtained, such a blood pressure, CBG if applicable, and timed get up and go. Any medications not marked as "taking" were not mentioned during the medication reconciliation part of the visit. Any vitals not documented were not able to be obtained due to this being a telehealth visit or patient was unable to self-report a recent blood pressure reading due to a lack of equipment at home via telehealth. Vitals that have been documented are verbally provided by the patient.   Subjective:   [name] [name]  is a 38 y.o. who presents for a Medicare Wellness preventive visit.  As a reminder, Annual Wellness Visits don't include a physical exam, and some assessments may be limited, especially if this visit is performed virtually. We may recommend an in-person follow-up visit with your provider if needed.  Visit Complete: Virtual I connected with  [name] [name]  on 11/06/23 by a audio enabled telemedicine application and verified that I am speaking with the correct person using two identifiers.  Patient Location: Home  Provider Location: Office/Clinic  I discussed the limitations of evaluation and management by telemedicine. The patient expressed understanding and agreed to proceed.  Vital Signs: Because this visit was a virtual/telehealth visit, some criteria may be missing or patient reported. Any vitals not documented were not able to be obtained and vitals that have been documented are patient reported.  VideoDeclined- This patient declined Librarian, academic. Therefore the visit was completed with audio only.  Persons Participating in Visit: Patient.  AWV Questionnaire: Yes: Patient Medicare AWV questionnaire was completed by the patient on 11/06/2023; I have confirmed that all information answered by patient is correct and no changes since this date.  Cardiac Risk Factors include: family  history of premature cardiovascular disease;hypertension;obesity (BMI >30kg/m2);smoking/ tobacco exposure;sedentary lifestyle     Objective:     Today's Vitals   11/06/23 0837 11/06/23 0839  Weight: 200 lb (90.7 kg)   Height: 5\' 7"  (1.702 m)   PainSc: 0-No pain 0-No pain   Body mass index is 31.32 kg/m.     11/06/2023    8:41 AM 10/31/2022    9:45 AM 05/30/2019    7:08 AM 11/09/2018   12:20 PM 05/27/2018    3:47 PM 05/10/2018   12:00 AM 05/09/2018    3:30 PM  Advanced Directives  Does Patient Have a Medical Advance Directive? No No Yes No No No No  Type of Surveyor, minerals;Living will      Does patient want to make changes to medical advance directive?   No - Patient declined      Copy of Healthcare Power of Attorney in Chart?   No - copy requested      Would patient like information on creating a medical advance directive? No - Patient declined Yes (MAU/Ambulatory/Procedural Areas - Information given)  No - Patient declined No - Patient declined No - Patient declined No - Patient declined    Current Medications (verified) Outpatient Encounter Medications as of 11/06/2023  Medication Sig   acetaminophen  (TYLENOL ) 325 MG tablet Take 1-2 tablets (325-650 mg total) by mouth every 4 (four) hours as needed for mild pain. (Patient taking differently: Take 325 mg by mouth daily as needed for mild pain (pain score 1-3) (pain).)   amLODipine  (NORVASC ) 10 MG tablet Take 1 tablet by mouth once daily   cetirizine  (EQ ALLERGY  RELIEF, CETIRIZINE ,) 10 MG tablet Take  1 tablet by mouth once daily   fluticasone  (FLONASE ) 50 MCG/ACT nasal spray Use 1 spray(s) in each nostril once daily (Patient not taking: Reported on 08/15/2023)   hydrOXYzine  (VISTARIL ) 50 MG capsule Take 1 capsule (50 mg total) by mouth every 6 (six) hours as needed for itching.   levETIRAcetam  (KEPPRA  XR) 500 MG 24 hr tablet Take 4 tablets (2,000 mg total) by mouth daily.   mometasone  (ELOCON ) 0.1 %  ointment Apply topically daily as needed (rash). For thick, stubborn areas. Do not use on the face, neck, armpits or groin area. Do not use more than 2 weeks in a row. (Patient not taking: Reported on 08/15/2023)   montelukast  (SINGULAIR ) 10 MG tablet TAKE 1 TABLET BY MOUTH AT BEDTIME   Multiple Vitamin (MULTIVITAMIN WITH MINERALS) TABS tablet Take 1 tablet by mouth daily. (Patient taking differently: Take 1 tablet by mouth at bedtime.)   Roflumilast  (ZORYVE ) 0.3 % CREA Apply 1 Application topically 2 (two) times daily.   triamcinolone  ointment (KENALOG ) 0.1 % Apply 1 Application topically 2 (two) times daily. USE 2 times DAILY THEN STOP for 2 weeks to prevent excessive skin thinning   Facility-Administered Encounter Medications as of 11/06/2023  Medication   dupilumab  (DUPIXENT ) prefilled syringe 300 mg    Allergies (verified) Sulfa antibiotics and Shellfish allergy    History: Past Medical History:  Diagnosis Date   Asthma, allergic    uses inhaler prn - infrequently   Bilateral thoracic back pain 09/13/2015   Eczema    Hypertension    Lactose intolerance    Mature cystic teratoma of ovary    Mood swings 06/01/2015   Seasonal allergies    Seizures (HCC)    Stroke (HCC) 04/2018   during aneurysm surgery   Past Surgical History:  Procedure Laterality Date   CRANIOTOMY N/A 05/10/2018   Procedure: 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;  Surgeon: Augusto Blonder, MD;  Location: [facility] OR;  Service: Neurosurgery;  Laterality: N/A;   IR 3D INDEPENDENT WKST  05/10/2018   IR ANGIO INTRA EXTRACRAN SEL INTERNAL CAROTID BILAT MOD SED  05/10/2018   IR ANGIO INTRA EXTRACRAN SEL INTERNAL CAROTID BILAT MOD SED  05/30/2019   IR ANGIO VERTEBRAL SEL VERTEBRAL BILAT MOD SED  05/10/2018   IR ANGIO VERTEBRAL SEL VERTEBRAL UNI R MOD SED  05/30/2019   IR US  GUIDE VASC ACCESS RIGHT  05/30/2019   RADIOLOGY WITH ANESTHESIA N/A 05/10/2018   Procedure: EMBOLIZATION OF  ANEURSYM;  Surgeon: Augusto Blonder, MD;  Location: [facility] OR;  Service: Radiology;  Laterality: N/A;   TUBAL LIGATION N/A 12/15/2016   Procedure: POST PARTUM TUBAL LIGATION;  Surgeon: Verlyn Goad, MD;  Location: [facility];  Service: Gynecology;  Laterality: N/A;   WISDOM TOOTH EXTRACTION     Family History  Adopted: Yes  Problem Relation Age of Onset   Allergic rhinitis Mother    Hypertension Mother    Eczema Mother    Allergic rhinitis Father    Hypertension Father    Allergic rhinitis Sister    Allergic rhinitis Brother    Eczema Maternal Aunt    Asthma Maternal Aunt    Allergic rhinitis Maternal Aunt    Hypertension Maternal Aunt    Allergic rhinitis Maternal Uncle    Hypertension Maternal Uncle    Allergic rhinitis Paternal Aunt    Allergic rhinitis Paternal Uncle    Allergic rhinitis Maternal Grandmother    Hypertension Maternal Grandmother    Eczema Maternal Grandmother  Allergic rhinitis Maternal Grandfather    Hypertension Maternal Grandfather    Allergic rhinitis Paternal Grandmother    Hypertension Paternal Grandmother    Allergic rhinitis Paternal Grandfather    Urticaria Daughter    Eczema Daughter    Asthma Daughter    Angioedema Neg Hx    Social History   Socioeconomic History   Marital status: Single    Spouse name: Not on file   Number of children: 2   Years of education: Not on file   Highest education level: Some college, no degree  Occupational History   Not on file  Tobacco Use   Smoking status: Every Day    Current packs/day: 0.25    Types: Cigarettes    Passive exposure: Current   Smokeless tobacco: Former  Building services engineer status: Never Used  Substance and Sexual Activity   Alcohol  use: No   Drug use: Not Currently    Types: Marijuana    Comment: used to yrs ago   Sexual activity: Not Currently    Birth control/protection: None  Other Topics Concern   Not on file  Social History Narrative   Lives with 2 children   Coffee 2  cups daily   Social Drivers of Health   Financial Resource Strain: Low Risk  (11/06/2023)   Overall Financial Resource Strain (CARDIA)    Difficulty of Paying Living Expenses: Not very hard  Food Insecurity: No Food Insecurity (11/06/2023)   Hunger Vital Sign    Worried About Running Out of Food in the Last Year: Never true    Ran Out of Food in the Last Year: Never true  Transportation Needs: No Transportation Needs (11/06/2023)   PRAPARE - Administrator, Civil Service (Medical): No    Lack of Transportation (Non-Medical): No  Physical Activity: Insufficiently Active (11/06/2023)   Exercise Vital Sign    Days of Exercise per Week: 5 days    Minutes of Exercise per Session: 10 min  Stress: No Stress Concern Present (11/06/2023)   [employer] of Occupational Health - Occupational Stress Questionnaire    Feeling of Stress : Only a little  Social Connections: Moderately Isolated (11/06/2023)   Social Connection and Isolation Panel [NHANES]    Frequency of Communication with Friends and Family: More than three times a week    Frequency of Social Gatherings with Friends and Family: Once a week    Attends Religious Services: 1 to 4 times per year    Active Member of [employer] or Organizations: No    Attends Engineer, structural: Never    Marital Status: Never married    Tobacco Counseling Ready to quit: Not Answered Counseling given: Not Answered    Clinical Intake:  Pre-visit preparation completed: Yes  Pain : No/denies pain Pain Score: 0-No pain     BMI - recorded: 31.32 Nutritional Status: BMI > 30  Obese Nutritional Risks: None Diabetes: No  Lab Results  Component Value Date   HGBA1C 4.3 (L) 05/28/2018   HGBA1C 4.5 06/29/2016   HGBA1C CANCELED 06/22/2016     How often do you need to have someone help you when you read instructions, pamphlets, or other written materials from your doctor or pharmacy?: 1 - Never What is the last grade level you  completed in school?: SOME COLLEGE  Interpreter Needed?: No  Information entered by :: Lian Pounds N. Marwa Fuhrman, LPN.   Activities of Daily Living     11/06/2023  8:41 AM  In your present state of health, do you have any difficulty performing the following activities:  Hearing? 0  Vision? 1  Difficulty concentrating or making decisions? 0  Walking or climbing stairs? 0  Dressing or bathing? 0  Doing errands, shopping? 0  Preparing Food and eating ? N  Using the Toilet? N  In the past six months, have you accidently leaked urine? N  Do you have problems with loss of bowel control? N  Managing your Medications? N  Managing your Finances? N  Housekeeping or managing your Housekeeping? N    Patient Care Team: Joaquin Mulberry, MD as PCP - General (Family Medicine) Terrilyn Fick, NP as Nurse Practitioner (Family Medicine)  Indicate any recent Medical Services you may have received from other than [facility] providers in the past year (date may be approximate).     Assessment:    This is a routine wellness examination for [place].  Hearing/Vision screen Hearing Screening - Comments:: Denies hearing difficulties.  Vision Screening - Comments:: No glasses - Not up to date with routine eye exams.    Goals Addressed               This Visit's Progress     Client understands the importance of follow-up with providers by attending scheduled visits. (pt-stated)        11/06/23: I want to remain active and improve my health.       Depression Screen     11/06/2023    8:42 AM 05/04/2023    8:13 AM 04/20/2023    8:18 AM 04/16/2023   11:05 AM 04/06/2023    8:17 AM 10/31/2022    9:40 AM 10/11/2022   11:03 AM  PHQ 2/9 Scores  PHQ - 2 Score 1     0 0  PHQ- 9 Score 3     3 3      Information is confidential and restricted. Go to Review Flowsheets to unlock data.    Fall Risk     11/06/2023    8:41 AM 04/03/2023   10:33 AM 10/31/2022    9:44 AM 10/31/2022    8:22 AM 10/11/2022    11:03 AM  Fall Risk   Falls in the past year? 0 1 1 1  0  Number falls in past yr: 0 0 0 0 0  Injury with Fall? 0 0 0 0 0  Risk for fall due to : No Fall Risks Other (Comment) History of fall(s)  No Fall Risks  Follow up Falls prevention discussed;Falls evaluation completed  Falls evaluation completed;Education provided;Falls prevention discussed      MEDICARE RISK AT HOME:  Medicare Risk at Home Any stairs in or around the home?: Yes If so, are there any without handrails?: No Home free of loose throw rugs in walkways, pet beds, electrical cords, etc?: Yes Adequate lighting in your home to reduce risk of falls?: Yes Life alert?: No Use of a cane, walker or w/c?: No Grab bars in the bathroom?: No Shower chair or bench in shower?: No Elevated toilet seat or a handicapped toilet?: No  TIMED UP AND GO:  Was the test performed?  No  Cognitive Function: 6CIT completed    11/06/2023    8:43 AM  MMSE - Mini Mental State Exam  Not completed: Unable to complete        11/06/2023    8:52 AM 10/31/2022    9:45 AM  6CIT Screen  What Year? 0 points 0 points  What month? 0 points 0 points  What time? 0 points 0 points  Count back from 20 0 points 0 points  Months in reverse 0 points 0 points  Repeat phrase 0 points 0 points  Total Score 0 points 0 points    Immunizations Immunization History  Administered Date(s) Administered   Influenza,inj,Quad PF,6+ Mos 05/29/2013, 06/16/2014, 06/01/2015, 06/22/2016, 04/12/2018   PFIZER(Purple Top)SARS-COV-2 Vaccination 10/02/2019, 10/27/2019   Pneumococcal Polysaccharide-23 05/28/2013   Tdap 04/24/2013, 08/12/2014, 09/25/2016    Screening Tests Health Maintenance  Topic Date Due   Pneumococcal Vaccine 43-50 Years old (2 of 2 - PCV) 05/28/2014   COVID-19 Vaccine (3 - 2024-25 season) 02/18/2023   INFLUENZA VACCINE  01/18/2024   Medicare Annual Wellness (AWV)  11/05/2024   DTaP/Tdap/Td (4 - Td or Tdap) 09/26/2026   Cervical Cancer  Screening (HPV/Pap Cotest)  10/11/2027   Hepatitis C Screening  Completed   HIV Screening  Completed   HPV VACCINES  Aged Out   Meningococcal B Vaccine  Aged Out    Health Maintenance  Health Maintenance Due  Topic Date Due   Pneumococcal Vaccine 55-3 Years old (2 of 2 - PCV) 05/28/2014   COVID-19 Vaccine (3 - 2024-25 season) 02/18/2023   Health Maintenance Items Addressed: Yes Patient is aware of care gaps.  NCIR was reviewed and verified, no new immunizations.  Additional Screening:  Vision Screening: Recommended annual ophthalmology exams for early detection of glaucoma and other disorders of the eye.  Dental Screening: Recommended annual dental exams for proper oral hygiene  Community Resource Referral / Chronic Care Management: CRR required this visit?  No   CCM required this visit?  No   Plan:    I have personally reviewed and noted the following in the patient's chart:   Medical and social history Use of alcohol , tobacco or illicit drugs  Current medications and supplements including opioid prescriptions. Patient is not currently taking opioid prescriptions. Functional ability and status Nutritional status Physical activity Advanced directives List of other physicians Hospitalizations, surgeries, and ER visits in previous 12 months Vitals Screenings to include cognitive, depression, and falls Referrals and appointments  In addition, I have reviewed and discussed with patient certain preventive protocols, quality metrics, and best practice recommendations. A written personalized care plan for preventive services as well as general preventive health recommendations were provided to patient.   Margette Sheldon, LPN   [id number]/61/0960   After Visit Summary: (MyChart) Due to this being a telephonic visit, the after visit summary with patients personalized plan was offered to patient via MyChart   Notes: Patient is aware of care gaps.  NCIR was reviewed and  verified, no new immunizations.

## 2023-11-06 NOTE — Patient Instructions (Signed)
 [name] [name]  , Thank you for taking time out of your busy schedule to complete your Annual Wellness Visit with me. I enjoyed our conversation and look forward to speaking with you again next year. I, as well as your care team,  appreciate your ongoing commitment to your health goals. Please review the following plan we discussed and let me know if I can assist you in the future. Your Game plan/ To Do List    Referrals: If you haven't heard from the office you've been referred to, please reach out to them at the phone provided.   Follow up Visits: Next Medicare AWV with our clinical staff: 11/22/2023 at 8:30 a.m. PHONE VISIT with Nurse Health Advisor   Have you seen your provider in the last 6 months (3 months if uncontrolled diabetes)? No Next Office Visit with your provider: 11/22/2023 at 10:50 a.m. OFFICE VISIT with Dr. Adan Holms  Clinician Recommendations:  Aim for 30 minutes of exercise or brisk walking, 6-8 glasses of water, and 5 servings of fruits and vegetables each day.       This is a list of the screening recommended for you and due dates:  Health Maintenance  Topic Date Due   Pneumococcal Vaccination (2 of 2 - PCV) 05/28/2014   COVID-19 Vaccine (3 - 2024-25 season) 02/18/2023   Flu Shot  01/18/2024   Medicare Annual Wellness Visit  11/05/2024   DTaP/Tdap/Td vaccine (4 - Td or Tdap) 09/26/2026   Pap with HPV screening  10/11/2027   Hepatitis C Screening  Completed   HIV Screening  Completed   HPV Vaccine  Aged Out   Meningitis B Vaccine  Aged Out    Advanced directives: (Declined) Advance directive discussed with you today. Even though you declined this today, please call our office should you change your mind, and we can give you the proper paperwork for you to fill out. Advance Care Planning is important because it:  [x]  Makes sure you receive the medical care that is consistent with your values, goals, and preferences  [x]  It provides guidance to your family and loved ones  and reduces their decisional burden about whether or not they are making the right decisions based on your wishes.  Follow the link provided in your after visit summary or read over the paperwork we have mailed to you to help you started getting your Advance Directives in place. If you need assistance in completing these, please reach out to us  so that we can help you!  See attachments for Preventive Care and Fall Prevention Tips.

## 2023-11-08 ENCOUNTER — Other Ambulatory Visit (HOSPITAL_COMMUNITY): Payer: Self-pay

## 2023-11-20 ENCOUNTER — Other Ambulatory Visit: Payer: Self-pay

## 2023-11-22 ENCOUNTER — Encounter: Payer: Self-pay | Admitting: Family Medicine

## 2023-11-22 ENCOUNTER — Ambulatory Visit: Attending: Family Medicine | Admitting: Family Medicine

## 2023-11-22 VITALS — BP 120/85 | HR 68 | Ht 67.0 in | Wt 230.0 lb

## 2023-11-22 DIAGNOSIS — Z720 Tobacco use: Secondary | ICD-10-CM

## 2023-11-22 DIAGNOSIS — Z23 Encounter for immunization: Secondary | ICD-10-CM | POA: Diagnosis not present

## 2023-11-22 DIAGNOSIS — G40909 Epilepsy, unspecified, not intractable, without status epilepticus: Secondary | ICD-10-CM

## 2023-11-22 DIAGNOSIS — Z131 Encounter for screening for diabetes mellitus: Secondary | ICD-10-CM | POA: Diagnosis not present

## 2023-11-22 DIAGNOSIS — L308 Other specified dermatitis: Secondary | ICD-10-CM

## 2023-11-22 DIAGNOSIS — I10 Essential (primary) hypertension: Secondary | ICD-10-CM | POA: Diagnosis not present

## 2023-11-22 DIAGNOSIS — J309 Allergic rhinitis, unspecified: Secondary | ICD-10-CM

## 2023-11-22 MED ORDER — FLUTICASONE PROPIONATE 50 MCG/ACT NA SUSP: 1.0000 | Freq: Every day | NASAL | 6 refills | Status: AC

## 2023-11-22 MED ORDER — AMLODIPINE BESYLATE 10 MG PO TABS: 10.0000 mg | ORAL_TABLET | Freq: Every day | ORAL | 1 refills | Status: DC

## 2023-11-22 MED ORDER — HYDROXYZINE PAMOATE 50 MG PO CAPS
50.0000 mg | ORAL_CAPSULE | Freq: Three times a day (TID) | ORAL | 2 refills | Status: DC | PRN
Start: 2023-11-22 — End: 2024-03-19

## 2023-11-22 MED ORDER — TRIAMCINOLONE ACETONIDE 0.1 % EX OINT: 1.0000 | TOPICAL_OINTMENT | Freq: Two times a day (BID) | CUTANEOUS | 2 refills | Status: AC

## 2023-11-22 MED ORDER — MONTELUKAST SODIUM 10 MG PO TABS: 10.0000 mg | ORAL_TABLET | Freq: Every day | ORAL | 1 refills | Status: AC

## 2023-11-22 NOTE — Progress Notes (Deleted)
 Subjective:  Patient ID: [name] [name] , female    DOB: 09-19-1985  Age: 38 y.o. MRN: [id number]  CC: Medical Management of Chronic Issues (No ocncerns)     Discussed the use of AI scribe software for clinical note transcription with the patient, who gave verbal consent to proceed.  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;  Surgeon: Augusto Blonder, MD;  Location: [facility];  Service: Neurosurgery;  Laterality: N/A;   IR 3D INDEPENDENT WKST  05/10/2018   IR ANGIO INTRA EXTRACRAN SEL INTERNAL CAROTID BILAT MOD SED  05/10/2018   IR ANGIO INTRA EXTRACRAN SEL INTERNAL CAROTID BILAT MOD SED  05/30/2019   IR ANGIO VERTEBRAL SEL VERTEBRAL BILAT MOD SED  05/10/2018   IR ANGIO VERTEBRAL SEL VERTEBRAL UNI R MOD SED  05/30/2019   IR US  GUIDE VASC ACCESS RIGHT  05/30/2019   RADIOLOGY WITH ANESTHESIA N/A 05/10/2018   Procedure: EMBOLIZATION OF ANEURSYM;  Surgeon: Augusto Blonder, MD;  Location: [facility];  Service: Radiology;  Laterality: N/A;   TUBAL LIGATION N/A 12/15/2016   Procedure: POST PARTUM TUBAL LIGATION;  Surgeon: Verlyn Goad, MD;  Location: [facility];  Service: Gynecology;  Laterality: N/A;   WISDOM TOOTH EXTRACTION      Family History  Adopted: Yes  Problem Relation Age of Onset   Allergic rhinitis Mother    Hypertension Mother    Eczema Mother    Allergic rhinitis Father    Hypertension Father    Allergic rhinitis Sister    Allergic rhinitis Brother    Eczema Maternal Aunt     Asthma Maternal Aunt    Allergic rhinitis Maternal Aunt    Hypertension Maternal Aunt    Allergic rhinitis Maternal Uncle    Hypertension Maternal Uncle    Allergic rhinitis Paternal Aunt    Allergic rhinitis Paternal Uncle    Allergic rhinitis Maternal Grandmother    Hypertension Maternal Grandmother    Eczema Maternal Grandmother    Allergic rhinitis Maternal Grandfather    Hypertension Maternal Grandfather    Allergic rhinitis Paternal Grandmother    Hypertension Paternal Grandmother    Allergic rhinitis Paternal Grandfather    Urticaria Daughter    Eczema Daughter    Asthma Daughter    Angioedema Neg Hx     Social History   Socioeconomic History   Marital status: Single    Spouse name: Not on file   Number of children: 2   Years of education: Not on file   Highest education level: Some college, no degree  Occupational History   Not on file  Tobacco Use   Smoking status: Every Day    Current packs/day: 0.25    Types: Cigarettes    Passive exposure: Current   Smokeless tobacco: Former  Building services engineer status: Never Used  Substance and Sexual Activity   Alcohol  use: No   Drug use: Not Currently    Types: Marijuana  Comment: used to yrs ago   Sexual activity: Not Currently    Birth control/protection: None  Other Topics Concern   Not on file  Social History Narrative   Lives with 2 children   Coffee 2 cups daily   Social Drivers of Health   Financial Resource Strain: Low Risk  (11/06/2023)   Overall Financial Resource Strain (CARDIA)    Difficulty of Paying Living Expenses: Not very hard  Food Insecurity: No Food Insecurity (11/06/2023)   Hunger Vital Sign    Worried About Running Out of Food in the Last Year: Never true    Ran Out of Food in the Last Year: Never true  Transportation Needs: No Transportation Needs (11/06/2023)   PRAPARE - Administrator, Civil Service (Medical): No    Lack of Transportation (Non-Medical): No  Physical  Activity: Insufficiently Active (11/06/2023)   Exercise Vital Sign    Days of Exercise per Week: 5 days    Minutes of Exercise per Session: 10 min  Stress: No Stress Concern Present (11/06/2023)   [employer] of Occupational Health - Occupational Stress Questionnaire    Feeling of Stress : Only a little  Social Connections: Moderately Isolated (11/06/2023)   Social Connection and Isolation Panel [NHANES]    Frequency of Communication with Friends and Family: More than three times a week    Frequency of Social Gatherings with Friends and Family: Once a week    Attends Religious Services: 1 to 4 times per year    Active Member of [employer] or Organizations: No    Attends Banker Meetings: Never    Marital Status: Never married    Allergies  Allergen Reactions   Sulfa Antibiotics Swelling    Causes swelling on the face.   Shellfish Allergy  Rash and Other (See Comments)    Stomach upset    Outpatient Medications Prior to Visit  Medication Sig Dispense Refill   acetaminophen  (TYLENOL ) 325 MG tablet Take 1-2 tablets (325-650 mg total) by mouth every 4 (four) hours as needed for mild pain. (Patient taking differently: Take 325 mg by mouth daily as needed for mild pain (pain score 1-3) (pain).)     amLODipine  (NORVASC ) 10 MG tablet Take 1 tablet by mouth once daily 90 tablet 0   cetirizine  (EQ ALLERGY  RELIEF, CETIRIZINE ,) 10 MG tablet Take 1 tablet by mouth once daily 90 tablet 0   fluticasone  (FLONASE ) 50 MCG/ACT nasal spray Use 1 spray(s) in each nostril once daily 48 g 0   hydrOXYzine  (VISTARIL ) 50 MG capsule Take 1 capsule (50 mg total) by mouth every 6 (six) hours as needed for itching. 40 capsule 0   levETIRAcetam  (KEPPRA  XR) 500 MG 24 hr tablet Take 4 tablets (2,000 mg total) by mouth daily. 360 tablet 3   mometasone  (ELOCON ) 0.1 % ointment Apply topically daily as needed (rash). For thick, stubborn areas. Do not use on the face, neck, armpits or groin area. Do not use  more than 2 weeks in a row. 45 g 1   montelukast  (SINGULAIR ) 10 MG tablet TAKE 1 TABLET BY MOUTH AT BEDTIME 90 tablet 0   Multiple Vitamin (MULTIVITAMIN WITH MINERALS) TABS tablet Take 1 tablet by mouth daily. (Patient taking differently: Take 1 tablet by mouth at bedtime.)     Roflumilast  (ZORYVE ) 0.3 % CREA Apply 1 Application topically 2 (two) times daily. 60 g 4   triamcinolone  ointment (KENALOG ) 0.1 % Apply 1 Application topically 2 (two) times daily. USE  2 times DAILY THEN STOP for 2 weeks to prevent excessive skin thinning 453.6 g 4   Facility-Administered Medications Prior to Visit  Medication Dose Route Frequency Provider Last Rate Last Admin   dupilumab  (DUPIXENT ) prefilled syringe 300 mg  300 mg Subcutaneous Q14 Days Trudy Fusi, DO   300 mg at 08/31/23 0849     ROS Review of Systems *** Objective:  BP 120/85   Pulse 68   Ht 5\' 7"  (1.702 m)   Wt 230 lb (104.3 kg)   SpO2 99%   BMI 36.02 kg/m      11/22/2023   10:49 AM 11/06/2023    8:37 AM 08/15/2023   10:50 AM  BP/Weight  Systolic BP 120  161  Diastolic BP 85  70  Wt. (Lbs) 230 200   BMI 36.02 kg/m2 31.32 kg/m2       Physical Exam ***    Latest Ref Rng & Units 04/03/2023   11:26 AM 06/05/2022    4:13 PM 10/06/2021    1:09 PM  CMP  Glucose 70 - 99 mg/dL 87  90  97   BUN 6 - 20 mg/dL 4  4  <5   Creatinine 0.96 - 1.00 mg/dL 0.45  4.09  8.11   Sodium 134 - 144 mmol/L 141  146  140   Potassium 3.5 - 5.2 mmol/L 4.1  4.4  4.0   Chloride 96 - 106 mmol/L 105  106  109   CO2 20 - 29 mmol/L 21  17  25    Calcium 8.7 - 10.2 mg/dL 8.9  9.4  9.3   Total Protein 6.0 - 8.5 g/dL 6.3  7.6    Total Bilirubin 0.0 - 1.2 mg/dL 0.3  0.2    Alkaline Phos 44 - 121 IU/L 82  71    AST 0 - 40 IU/L 26  20    ALT 0 - 32 IU/L 25  23      Lipid Panel     Component Value Date/Time   CHOL 166 06/05/2022 1613   TRIG 61 06/05/2022 1613   HDL 61 06/05/2022 1613   CHOLHDL 3.2 08/01/2017 0934   CHOLHDL 4.4 07/02/2015 0945   VLDL 12  07/02/2015 0945   LDLCALC 93 06/05/2022 1613    CBC    Component Value Date/Time   WBC 7.2 04/03/2023 1126   WBC 5.3 10/06/2021 1309   RBC 3.95 04/03/2023 1126   RBC 3.88 10/06/2021 1309   HGB 12.8 04/03/2023 1126   HCT 38.8 04/03/2023 1126   PLT 340 04/03/2023 1126   MCV 98 (H) 04/03/2023 1126   MCH 32.4 04/03/2023 1126   MCH 33.5 10/06/2021 1309   MCHC 33.0 04/03/2023 1126   MCHC 32.3 10/06/2021 1309   RDW 11.6 (L) 04/03/2023 1126   LYMPHSABS 2.2 04/03/2023 1126   MONOABS 0.2 05/30/2019 0722   EOSABS 0.3 04/03/2023 1126   BASOSABS 0.0 04/03/2023 1126    Lab Results  Component Value Date   HGBA1C 4.3 (L) 05/28/2018      There are no diagnoses linked to this encounter.  No orders of the defined types were placed in this encounter.   Follow-up: No follow-ups on file.       Joaquin Mulberry, MD, FAAFP. [facility] [place], [place] [phone]   11/22/2023, 10:56 AM

## 2023-11-22 NOTE — Progress Notes (Signed)
 Subjective:  Patient ID: [name] [name] , female    DOB: 07-17-1985  Age: 38 y.o. MRN: [id number]  CC: Medical Management of Chronic Issues (No ocncerns)     Discussed the use of AI scribe software for clinical note transcription with the patient, who gave verbal consent to proceed.  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  She receives allergy  shots biweekly with mixed results and continues to take Singulair  and Zyrtec  daily. Flonase  is used as a prescription medication. She experiences itching on her elbows and ankles from her eczema using hydroxyzine  50 mg for relief. Triamcinolone  is applied to her hands and ankles, while mometasone  is used for her face and ears per patient.  She is under the care of allergy  and immunology.  Her hypertension is controlled with amlodipine  10 mg daily, maintaining a blood pressure of 120/85 mmHg. for seizures she remains on Depakote with no recent seizures and is under the care of neurology.  She smokes half a pack of cigarettes per day, exclusively outside. She walks her children to and from school daily, providing regular physical activity.    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;  Surgeon: Augusto Blonder, MD;  Location: [facility];  Service: Neurosurgery;  Laterality: N/A;   IR 3D INDEPENDENT WKST  05/10/2018   IR ANGIO INTRA EXTRACRAN SEL INTERNAL CAROTID BILAT MOD SED  05/10/2018   IR ANGIO INTRA EXTRACRAN SEL INTERNAL CAROTID BILAT MOD SED  05/30/2019   IR ANGIO VERTEBRAL SEL VERTEBRAL BILAT MOD SED  05/10/2018   IR ANGIO VERTEBRAL SEL VERTEBRAL UNI R MOD SED  05/30/2019   IR US  GUIDE VASC ACCESS RIGHT  05/30/2019   RADIOLOGY WITH ANESTHESIA N/A 05/10/2018   Procedure: EMBOLIZATION OF ANEURSYM;  Surgeon: Augusto Blonder, MD;  Location: [facility];  Service: Radiology;  Laterality: N/A;   TUBAL LIGATION N/A 12/15/2016   Procedure: POST PARTUM TUBAL LIGATION;  Surgeon: Verlyn Goad, MD;  Location: [facility];  Service: Gynecology;  Laterality: N/A;   WISDOM TOOTH EXTRACTION      Family History  Adopted: Yes  Problem Relation Age of Onset   Allergic rhinitis Mother    Hypertension Mother    Eczema Mother    Allergic rhinitis Father    Hypertension Father    Allergic rhinitis Sister    Allergic rhinitis Brother    Eczema Maternal Aunt    Asthma Maternal Aunt    Allergic rhinitis Maternal Aunt    Hypertension Maternal Aunt    Allergic rhinitis Maternal Uncle    Hypertension Maternal Uncle    Allergic rhinitis Paternal Aunt    Allergic rhinitis  Paternal Uncle    Allergic rhinitis Maternal Grandmother    Hypertension Maternal Grandmother    Eczema Maternal Grandmother    Allergic rhinitis Maternal Grandfather    Hypertension Maternal Grandfather    Allergic rhinitis Paternal Grandmother    Hypertension Paternal Grandmother    Allergic rhinitis Paternal Grandfather    Urticaria Daughter    Eczema Daughter    Asthma Daughter    Angioedema Neg Hx     Social History   Socioeconomic History   Marital status: Single    Spouse name: Not on  file   Number of children: 2   Years of education: Not on file   Highest education level: Some college, no degree  Occupational History   Not on file  Tobacco Use   Smoking status: Every Day    Current packs/day: 0.25    Types: Cigarettes    Passive exposure: Current   Smokeless tobacco: Former  Building services engineer status: Never Used  Substance and Sexual Activity   Alcohol  use: No   Drug use: Not Currently    Types: Marijuana    Comment: used to yrs ago   Sexual activity: Not Currently    Birth control/protection: None  Other Topics Concern   Not on file  Social History Narrative   Lives with 2 children   Coffee 2 cups daily   Social Drivers of Health   Financial Resource Strain: Low Risk  (11/06/2023)   Overall Financial Resource Strain (CARDIA)    Difficulty of Paying Living Expenses: Not very hard  Food Insecurity: No Food Insecurity (11/06/2023)   Hunger Vital Sign    Worried About Running Out of Food in the Last Year: Never true    Ran Out of Food in the Last Year: Never true  Transportation Needs: No Transportation Needs (11/06/2023)   PRAPARE - Administrator, Civil Service (Medical): No    Lack of Transportation (Non-Medical): No  Physical Activity: Insufficiently Active (11/06/2023)   Exercise Vital Sign    Days of Exercise per Week: 5 days    Minutes of Exercise per Session: 10 min  Stress: No Stress Concern Present (11/06/2023)   [employer] of Occupational Health - Occupational Stress Questionnaire    Feeling of Stress : Only a little  Social Connections: Moderately Isolated (11/06/2023)   Social Connection and Isolation Panel [NHANES]    Frequency of Communication with Friends and Family: More than three times a week    Frequency of Social Gatherings with Friends and Family: Once a week    Attends Religious Services: 1 to 4 times per year    Active Member of [employer] or Organizations: No    Attends Banker Meetings: Never     Marital Status: Never married    Allergies  Allergen Reactions   Sulfa Antibiotics Swelling    Causes swelling on the face.   Shellfish Allergy  Rash and Other (See Comments)    Stomach upset    Outpatient Medications Prior to Visit  Medication Sig Dispense Refill   acetaminophen  (TYLENOL ) 325 MG tablet Take 1-2 tablets (325-650 mg total) by mouth every 4 (four) hours as needed for mild pain. (Patient taking differently: Take 325 mg by mouth daily as needed for mild pain (pain score 1-3) (pain).)     cetirizine  (EQ ALLERGY  RELIEF, CETIRIZINE ,) 10 MG tablet Take 1 tablet by mouth once daily 90 tablet 0   levETIRAcetam  (KEPPRA  XR) 500 MG 24 hr tablet  Take 4 tablets (2,000 mg total) by mouth daily. 360 tablet 3   mometasone  (ELOCON ) 0.1 % ointment Apply topically daily as needed (rash). For thick, stubborn areas. Do not use on the face, neck, armpits or groin area. Do not use more than 2 weeks in a row. 45 g 1   Multiple Vitamin (MULTIVITAMIN WITH MINERALS) TABS tablet Take 1 tablet by mouth daily. (Patient taking differently: Take 1 tablet by mouth at bedtime.)     Roflumilast  (ZORYVE ) 0.3 % CREA Apply 1 Application topically 2 (two) times daily. 60 g 4   amLODipine  (NORVASC ) 10 MG tablet Take 1 tablet by mouth once daily 90 tablet 0   fluticasone  (FLONASE ) 50 MCG/ACT nasal spray Use 1 spray(s) in each nostril once daily 48 g 0   hydrOXYzine  (VISTARIL ) 50 MG capsule Take 1 capsule (50 mg total) by mouth every 6 (six) hours as needed for itching. 40 capsule 0   montelukast  (SINGULAIR ) 10 MG tablet TAKE 1 TABLET BY MOUTH AT BEDTIME 90 tablet 0   triamcinolone  ointment (KENALOG ) 0.1 % Apply 1 Application topically 2 (two) times daily. USE 2 times DAILY THEN STOP for 2 weeks to prevent excessive skin thinning 453.6 g 4   Facility-Administered Medications Prior to Visit  Medication Dose Route Frequency Provider Last Rate Last Admin   dupilumab  (DUPIXENT ) prefilled syringe 300 mg  300 mg  Subcutaneous Q14 Days Eudelia Hero M, DO   300 mg at 08/31/23 0849     ROS Review of Systems  Constitutional:  Negative for activity change and appetite change.  HENT:  Negative for sinus pressure and sore throat.   Respiratory:  Negative for chest tightness, shortness of breath and wheezing.   Cardiovascular:  Negative for chest pain and palpitations.  Gastrointestinal:  Negative for abdominal distention, abdominal pain and constipation.  Genitourinary: Negative.   Musculoskeletal: Negative.   Skin:  Positive for rash.  Psychiatric/Behavioral:  Negative for behavioral problems and dysphoric mood.     Objective:  BP 120/85   Pulse 68   Ht 5\' 7"  (1.702 m)   Wt 230 lb (104.3 kg)   SpO2 99%   BMI 36.02 kg/m      11/22/2023   10:49 AM 11/06/2023    8:37 AM 08/15/2023   10:50 AM  BP/Weight  Systolic BP 120  161  Diastolic BP 85  70  Wt. (Lbs) 230 200   BMI 36.02 kg/m2 31.32 kg/m2       Physical Exam Constitutional:      Appearance: She is well-developed.  Cardiovascular:     Rate and Rhythm: Normal rate.     Heart sounds: Normal heart sounds. No murmur heard. Pulmonary:     Effort: Pulmonary effort is normal.     Breath sounds: Normal breath sounds. No wheezing or rales.  Chest:     Chest wall: No tenderness.  Abdominal:     General: Bowel sounds are normal. There is no distension.     Palpations: Abdomen is soft. There is no mass.     Tenderness: There is no abdominal tenderness.  Musculoskeletal:        General: Normal range of motion.     Right lower leg: No edema.     Left lower leg: No edema.  Neurological:     Mental Status: She is alert and oriented to person, place, and time.  Psychiatric:        Mood and Affect: Mood normal.  Latest Ref Rng & Units 04/03/2023   11:26 AM 06/05/2022    4:13 PM 10/06/2021    1:09 PM  CMP  Glucose 70 - 99 mg/dL 87  90  97   BUN 6 - 20 mg/dL 4  4  <5   Creatinine 0.10 - 1.00 mg/dL 2.72  5.36  6.44   Sodium 134 -  144 mmol/L 141  146  140   Potassium 3.5 - 5.2 mmol/L 4.1  4.4  4.0   Chloride 96 - 106 mmol/L 105  106  109   CO2 20 - 29 mmol/L 21  17  25    Calcium 8.7 - 10.2 mg/dL 8.9  9.4  9.3   Total Protein 6.0 - 8.5 g/dL 6.3  7.6    Total Bilirubin 0.0 - 1.2 mg/dL 0.3  0.2    Alkaline Phos 44 - 121 IU/L 82  71    AST 0 - 40 IU/L 26  20    ALT 0 - 32 IU/L 25  23      Lipid Panel     Component Value Date/Time   CHOL 166 06/05/2022 1613   TRIG 61 06/05/2022 1613   HDL 61 06/05/2022 1613   CHOLHDL 3.2 08/01/2017 0934   CHOLHDL 4.4 07/02/2015 0945   VLDL 12 07/02/2015 0945   LDLCALC 93 06/05/2022 1613    CBC    Component Value Date/Time   WBC 7.2 04/03/2023 1126   WBC 5.3 10/06/2021 1309   RBC 3.95 04/03/2023 1126   RBC 3.88 10/06/2021 1309   HGB 12.8 04/03/2023 1126   HCT 38.8 04/03/2023 1126   PLT 340 04/03/2023 1126   MCV 98 (H) 04/03/2023 1126   MCH 32.4 04/03/2023 1126   MCH 33.5 10/06/2021 1309   MCHC 33.0 04/03/2023 1126   MCHC 32.3 10/06/2021 1309   RDW 11.6 (L) 04/03/2023 1126   LYMPHSABS 2.2 04/03/2023 1126   MONOABS 0.2 05/30/2019 0722   EOSABS 0.3 04/03/2023 1126   BASOSABS 0.0 04/03/2023 1126    Lab Results  Component Value Date   HGBA1C 4.3 (L) 05/28/2018       1. Essential hypertension Controlled Continue current regimen Counseled on blood pressure goal of less than 130/80, low-sodium, DASH diet, medication compliance, 150 minutes of moderate intensity exercise per week. Discussed medication compliance, adverse effects. - amLODipine  (NORVASC ) 10 MG tablet; Take 1 tablet (10 mg total) by mouth daily.  Dispense: 90 tablet; Refill: 1 - LP+Non-HDL Cholesterol; Future - CMP14+EGFR; Future  2. Allergic rhinitis, unspecified seasonality, unspecified trigger Stable - fluticasone  (FLONASE ) 50 MCG/ACT nasal spray; Place 1 spray into both nostrils daily.  Dispense: 48 g; Refill: 6 - montelukast  (SINGULAIR ) 10 MG tablet; Take 1 tablet (10 mg total) by mouth at  bedtime.  Dispense: 90 tablet; Refill: 1  3. Other eczema (Primary) Uncontrolled with intermittent flares Currently on immunotherapy - hydrOXYzine  (VISTARIL ) 50 MG capsule; Take 1 capsule (50 mg total) by mouth every 8 (eight) hours as needed for itching.  Dispense: 60 capsule; Refill: 2 - triamcinolone  ointment (KENALOG ) 0.1 %; Apply 1 Application topically 2 (two) times daily. USE 2 times DAILY THEN STOP for 2 weeks to prevent excessive skin thinning  Dispense: 453.6 g; Refill: 2  4. Screening for diabetes mellitus - Hemoglobin A1c; Future  5. Tobacco abuse Smoking cessation support: smoking cessation hotline: 1-800-QUIT-NOW.  Smoking cessation classes are available through [facility]. Call [phone] or visit our website at [email or web address].  Spent  3 minutes counseling on dangers of tobacco use and benefits of quitting, offered pharmacological intervention to aid quitting and patient is not ready to quit.   6. Seizure disorder (HCC) Stable Continue Keppra   7. Need for Streptococcus pneumoniae vaccination - Pneumococcal conjugate vaccine 20-valent  Meds ordered this encounter  Medications   amLODipine  (NORVASC ) 10 MG tablet    Sig: Take 1 tablet (10 mg total) by mouth daily.    Dispense:  90 tablet    Refill:  1   fluticasone  (FLONASE ) 50 MCG/ACT nasal spray    Sig: Place 1 spray into both nostrils daily.    Dispense:  48 g    Refill:  6   hydrOXYzine  (VISTARIL ) 50 MG capsule    Sig: Take 1 capsule (50 mg total) by mouth every 8 (eight) hours as needed for itching.    Dispense:  60 capsule    Refill:  2   montelukast  (SINGULAIR ) 10 MG tablet    Sig: Take 1 tablet (10 mg total) by mouth at bedtime.    Dispense:  90 tablet    Refill:  1   triamcinolone  ointment (KENALOG ) 0.1 %    Sig: Apply 1 Application topically 2 (two) times daily. USE 2 times DAILY THEN STOP for 2 weeks to prevent excessive skin thinning    Dispense:  453.6 g    Refill:   2    Follow-up: Return in about 6 months (around 05/23/2024).       Joaquin Mulberry, MD, FAAFP. [facility] [place], [place] [phone]   11/22/2023, 11:52 AM

## 2023-11-22 NOTE — Patient Instructions (Signed)
 VISIT SUMMARY:  Today, you came in for a follow-up visit to discuss your allergies, dermatitis, hypertension, and general health maintenance. We reviewed your current medications and their effectiveness, and discussed some additional health screenings and vaccinations.  YOUR PLAN:  -ALLERGIC RHINITIS: Allergic rhinitis is an allergic reaction that causes sneezing, congestion, and a runny nose. Continue taking Montelukast  10 mg at bedtime, your allergy  shots every two weeks, and using Fluticasone  (Flonase ) as prescribed.  -DERMATITIS: Dermatitis is a condition that causes itchy, inflamed skin. Continue using Hydroxyzine  50 mg for itching, Triamcinolone  acetonide for your hands and ankles, and Mometasone  for your ears and face. Be aware of the risk of skin thinning with prolonged steroid use.  -ESSENTIAL HYPERTENSION: Hypertension is high blood pressure. Your blood pressure is well-controlled with Amlodipine . Continue taking Amlodipine  10 mg daily.  -SEIZURE DISORDER: A seizure disorder is a condition where you have episodes of abnormal brain activity. You are under the care of a neurologist and have not had recent seizures. Continue taking Levetiracetam  (Keppra  XR) 2000 mg daily.  -TOBACCO ABUSE: Smoking can lead to many health problems. You currently smoke half a pack per day. We encourage you to consider quitting smoking.  -GENERAL HEALTH MAINTENANCE: You are due for a pneumonia vaccine and need a cholesterol screening. You get regular exercise by walking your children to school. We will administer the pneumonia vaccine today and order a fasting cholesterol screening, as well as labs for diabetes screening, kidney, and liver function in two weeks.  INSTRUCTIONS:  Please schedule your lab work in two weeks for cholesterol, diabetes screening, kidney, and liver function. You will need transportation for this appointment.

## 2023-12-04 ENCOUNTER — Other Ambulatory Visit: Payer: Self-pay

## 2023-12-04 NOTE — Progress Notes (Signed)
 Last dose of Dupixent  in office was 3/14. Disenrolling.

## 2024-02-14 ENCOUNTER — Telehealth: Admitting: Nurse Practitioner

## 2024-02-14 DIAGNOSIS — G479 Sleep disorder, unspecified: Secondary | ICD-10-CM

## 2024-02-14 DIAGNOSIS — F4320 Adjustment disorder, unspecified: Secondary | ICD-10-CM | POA: Diagnosis not present

## 2024-02-14 NOTE — Progress Notes (Signed)
 Virtual Visit Consent   [name] [name] , you are scheduled for a virtual visit with a [facility] provider today. Just as with appointments in the office, your consent must be obtained to participate. Your consent will be active for this visit and any virtual visit you may have with one of our providers in the next 365 days. If you have a MyChart account, a copy of this consent can be sent to you electronically.  As this is a virtual visit, video technology does not allow for your provider to perform a traditional examination. This may limit your provider's ability to fully assess your condition. If your provider identifies any concerns that need to be evaluated in person or the need to arrange testing (such as labs, EKG, etc.), we will make arrangements to do so. Although advances in technology are sophisticated, we cannot ensure that it will always work on either your end or our end. If the connection with a video visit is poor, the visit may have to be switched to a telephone visit. With either a video or telephone visit, we are not always able to ensure that we have a secure connection.  By engaging in this virtual visit, you consent to the provision of healthcare and authorize for your insurance to be billed (if applicable) for the services provided during this visit. Depending on your insurance coverage, you may receive a charge related to this service.  I need to obtain your verbal consent now. Are you willing to proceed with your visit today? [name] [name]  has provided verbal consent on 02/14/2024 for a virtual visit (video or telephone). Lauraine Kitty, FNP  Date: 02/14/2024 2:19 PM   Virtual Visit via Video Note   I, Lauraine Kitty, connected with  [name] [name]   ([id number], 1986-05-27) on 02/14/24 at  2:20 PM EDT by a video-enabled telemedicine application and verified that I am speaking with the correct person using two identifiers.  Location: Patient: Virtual Visit Location  Patient: Home Provider: Virtual Visit Location Provider: Home Office   I discussed the limitations of evaluation and management by telemedicine and the availability of in person appointments. The patient expressed understanding and agreed to proceed.    History of Present Illness: [name] [name]  is a 38 y.o. who identifies as a female who was assigned female at birth, and is being seen today for assistance in getting a new referral for therapy. She is a current patient with [facility] for PCP needs. Seeking [facility] today due to no immediate availability at her home primary care office.   She has been in a grief cycle recently due to the loss of a family member. The patient was seeing a therapist recently but due to missing two appointments she was dropped from that practice.   She has had difficult time falling asleep and staying asleep. She does not take any medication to assist with sleep. She has not been on medication for sleep/anxiety or depression in the past.  She is a patient with allergy  and asthma and is complient with her regimen including Zyrtec , flonase , and Singulair  and Dupixent      Problems:  Patient Active Problem List   Diagnosis Date Noted   Elevated IgE level 04/09/2023   Acute adjustment disorder with depressed mood 04/04/2023   Grief 04/04/2023   Needs assistance with community resources 04/04/2023   Eczema 04/03/2023   Hives 04/03/2023   Cellulitis of multiple sites of lower extremity 04/03/2023   Seizures (HCC) 06/10/2019   Late  effect of cerebrovascular accident (CVA) 12/09/2018   Primary osteoarthritis of right knee    Knee pain, right    Benign essential HTN    History of subarachnoid hemorrhage    Brain aneurysm    Gastroesophageal reflux disease without esophagitis 10/09/2016   BMI 31.0-31.9,adult 07/20/2016   Tobacco abuse 06/01/2015   Marijuana use 06/23/2014    Allergies:  Allergies  Allergen Reactions   Sulfa Antibiotics Swelling     Causes swelling on the face.   Shellfish Allergy  Rash and Other (See Comments)    Stomach upset   Medications:  Current Outpatient Medications:    acetaminophen  (TYLENOL ) 325 MG tablet, Take 1-2 tablets (325-650 mg total) by mouth every 4 (four) hours as needed for mild pain. (Patient taking differently: Take 325 mg by mouth daily as needed for mild pain (pain score 1-3) (pain).), Disp: , Rfl:    amLODipine  (NORVASC ) 10 MG tablet, Take 1 tablet (10 mg total) by mouth daily., Disp: 90 tablet, Rfl: 1   cetirizine  (EQ ALLERGY  RELIEF, CETIRIZINE ,) 10 MG tablet, Take 1 tablet by mouth once daily, Disp: 90 tablet, Rfl: 0   fluticasone  (FLONASE ) 50 MCG/ACT nasal spray, Place 1 spray into both nostrils daily., Disp: 48 g, Rfl: 6   hydrOXYzine  (VISTARIL ) 50 MG capsule, Take 1 capsule (50 mg total) by mouth every 8 (eight) hours as needed for itching., Disp: 60 capsule, Rfl: 2   levETIRAcetam  (KEPPRA  XR) 500 MG 24 hr tablet, Take 4 tablets (2,000 mg total) by mouth daily., Disp: 360 tablet, Rfl: 3   mometasone  (ELOCON ) 0.1 % ointment, Apply topically daily as needed (rash). For thick, stubborn areas. Do not use on the face, neck, armpits or groin area. Do not use more than 2 weeks in a row., Disp: 45 g, Rfl: 1   montelukast  (SINGULAIR ) 10 MG tablet, Take 1 tablet (10 mg total) by mouth at bedtime., Disp: 90 tablet, Rfl: 1   Multiple Vitamin (MULTIVITAMIN WITH MINERALS) TABS tablet, Take 1 tablet by mouth daily. (Patient taking differently: Take 1 tablet by mouth at bedtime.), Disp: , Rfl:    Roflumilast  (ZORYVE ) 0.3 % CREA, Apply 1 Application topically 2 (two) times daily., Disp: 60 g, Rfl: 4   triamcinolone  ointment (KENALOG ) 0.1 %, Apply 1 Application topically 2 (two) times daily. USE 2 times DAILY THEN STOP for 2 weeks to prevent excessive skin thinning, Disp: 453.6 g, Rfl: 2  Current Facility-Administered Medications:    dupilumab  (DUPIXENT ) prefilled syringe 300 mg, 300 mg, Subcutaneous, Q14 Days,  Luke Orlan HERO, DO, 300 mg at 08/31/23 9150  Observations/Objective: Patient is well-developed, well-nourished in no acute distress.  Resting comfortably  at home.  Head is normocephalic, atraumatic.  No labored breathing.  Speech is clear and coherent with logical content.  Patient is alert and oriented at baseline.    Assessment and Plan:  1. Grief reaction (Primary)  - AMB Referral [facility] Care Management  2. Sleep disturbance  Discussed with patient options for sleep assist, she would like to start with returning to therapy as this was helping greatly in the past. Encouraged to follow up with [facility] or her PCP for ongoing needs. New referral placed for patient  She denies any SDOH needs at this time    Follow Up Instructions: I discussed the assessment and treatment plan with the patient. The patient was provided an opportunity to ask questions and all were answered. The patient agreed with the plan and demonstrated an understanding of the instructions.  A copy of instructions were sent to the patient via MyChart unless otherwise noted below.    The patient was advised to call back or seek an in-person evaluation if the symptoms worsen or if the condition fails to improve as anticipated.    Lauraine Kitty, FNP

## 2024-02-15 ENCOUNTER — Telehealth: Payer: Self-pay

## 2024-02-15 DIAGNOSIS — Z008 Encounter for other general examination: Secondary | ICD-10-CM | POA: Diagnosis not present

## 2024-02-15 DIAGNOSIS — F17210 Nicotine dependence, cigarettes, uncomplicated: Secondary | ICD-10-CM | POA: Diagnosis not present

## 2024-02-15 DIAGNOSIS — E669 Obesity, unspecified: Secondary | ICD-10-CM | POA: Diagnosis not present

## 2024-02-15 DIAGNOSIS — F4321 Adjustment disorder with depressed mood: Secondary | ICD-10-CM | POA: Diagnosis not present

## 2024-02-15 DIAGNOSIS — L309 Dermatitis, unspecified: Secondary | ICD-10-CM | POA: Diagnosis not present

## 2024-02-15 DIAGNOSIS — I10 Essential (primary) hypertension: Secondary | ICD-10-CM | POA: Diagnosis not present

## 2024-02-15 DIAGNOSIS — Z6834 Body mass index (BMI) 34.0-34.9, adult: Secondary | ICD-10-CM | POA: Diagnosis not present

## 2024-02-15 NOTE — Progress Notes (Signed)
 Complex Care Management Note Care Guide Note  02/15/2024 Name: [name] [name]  MRN: [id number] DOB: [birth year]   Complex Care Management Outreach Attempts: An unsuccessful telephone outreach was attempted today to offer the patient information about available complex care management services.  Follow Up Plan:  Additional outreach attempts will be made to offer the patient complex care management information and services.   Encounter Outcome:  No Answer  Jeoffrey Buffalo , RMA     [place]  [facility], [facility] Guide  Direct Dial: [phone]  Website: [email or web address]

## 2024-02-20 NOTE — Progress Notes (Unsigned)
 Complex Care Management Note Care Guide Note  02/20/2024 Name: [name] [name]  MRN: [id number] DOB: [birth year]   Complex Care Management Outreach Attempts: A second unsuccessful outreach was attempted today to offer the patient with information about available complex care management services.  Follow Up Plan:  Additional outreach attempts will be made to offer the patient complex care management information and services.   Encounter Outcome:  No Answer  Jeoffrey Buffalo , RMA     [place]  [facility], [facility] Guide  Direct Dial: [phone]  Website: [email or web address]

## 2024-02-22 NOTE — Progress Notes (Signed)
 Complex Care Management Note Care Guide Note  02/22/2024 Name: [name] [name]  MRN: [id number] DOB: 1986-04-14   Complex Care Management Outreach Attempts: A third unsuccessful outreach was attempted today to offer the patient with information about available complex care management services.  Follow Up Plan:  No further outreach attempts will be made at this time. We have been unable to contact the patient to offer or enroll patient in complex care management services.  Encounter Outcome:  No Answer  Jeoffrey Buffalo , RMA     [place]  [facility], [facility] Guide  Direct Dial: [phone]  Website: [email or web address]

## 2024-03-19 ENCOUNTER — Other Ambulatory Visit: Payer: Self-pay | Admitting: Family Medicine

## 2024-03-19 DIAGNOSIS — L308 Other specified dermatitis: Secondary | ICD-10-CM

## 2024-05-05 NOTE — Patient Instructions (Signed)
 Below is our plan:  We will continue levetiracetam  XR 2000mg  daily   Please make sure you are consistent with timing of seizure medication. I recommend annual visit with primary care provider (PCP) for complete physical and routine blood work. I recommend daily intake of vitamin D (400-800iu) and calcium (800-1000mg ) for bone health. Discuss Dexa screening with PCP.   According to [place] law, you can not drive unless you are seizure / syncope free for at least 6 months and under physician's care.  Please maintain precautions. Do not participate in activities where a loss of awareness could harm you or someone else. No swimming alone, no tub bathing, no hot tubs, no driving, no operating motorized vehicles (cars, ATVs, motocycles, etc), lawnmowers, power tools or firearms. No standing at heights, such as rooftops, ladders or stairs. Avoid hot objects such as stoves, heaters, open fires. Wear a helmet when riding a bicycle, scooter, skateboard, etc. and avoid areas of traffic. Set your water heater to 120 degrees or less.  SUDEP is the sudden, unexpected death of someone with epilepsy, who was otherwise healthy. In SUDEP cases, no other cause of death is found when an autopsy is done. Each year, more than 1 in 1,000 people with epilepsy die from SUDEP. This is the leading cause of death in people with uncontrolled seizures. Until further answers are available, the best way to prevent SUDEP is to lower your risk by controlling seizures. Research has found that people with all types of epilepsy that experience convulsive seizures can be at risk.  Please make sure you are staying well hydrated. I recommend 50-60 ounces daily. Well balanced diet and regular exercise encouraged. Consistent sleep schedule with 6-8 hours recommended.   Please continue follow up with care team as directed.   Follow up with me in 1 year   You may receive a survey regarding today's visit. I encourage you to leave honest feed back  as I do use this information to improve patient care. Thank you for seeing me today!

## 2024-05-05 NOTE — Progress Notes (Unsigned)
 PATIENT: [name] [name]  DOB: [birth year]  REASON FOR VISIT: follow up HISTORY FROM: patient  Virtual Visit via Telephone Note  I connected with [name] [name]  on 05/06/24 at 11:30 AM EST by telephone and verified that I am speaking with the correct person using two identifiers.   I discussed the limitations, risks, security and privacy concerns of performing an evaluation and management service by telephone and the availability of in person appointments. I also discussed with the patient that there may be a patient responsible charge related to this service. The patient expressed understanding and agreed to proceed.   History of Present Illness:  05/06/24 ALL ([place]): [name] [name] returns for follow up for seizures. She is doing well on levetiracetam  XR 2000mg  daily. Taking daily without missed doses. No seizures. She is seen yearly by PCP.   04/26/2023 ALL ([place]): [name] [name] returns for follow up for seizures. She continues lev XR 2000mg  daily. She is tolerating med well. No seizure activity. No missed doses. She is followed regularly by PCP.   04/24/2022 ALL: [name] [name]  is a 38 y.o. female here today for follow up seizures. She continues levetiracetam  XR 2000mg  QD. She reports doing very well. No seizure activity. No missed doses of ASM. She is tolerating medication well. She is followed regularly by PCP. She is feeling well and denies concerns, today.   10/18/21 ALL: [name] [name] returns for follow up for seizures. She was last seen 10/2019 and doing well on levetiracetam  1000mg  BID. She was seen in the ED 10/06/2021 with reported seizures following 2 days of missed AED. Per her boyfriend's report she had two tonic clonic seizure in her sleep lasting approx 3 minutes each. First around 5am. She was able to get up with oldest daughter and get her ready for school. She laid back down with youngest daughter and had another seizure around 11am. She lost bladder control with first seizure. She  was having a hard time walking to the car after the second seizure. Her aunt had to help her. Her mother presents with her, today, and tells me that [name] [name] had a conversation with her in the hospital that she was only taking levetiracetam  once daily. [name] doesn't remember the conversation but states that she may have taken once daily for a few days as she was out of the medication. She does admit that she is not as consistent with timing of dosing as she should be. She enjoys staying up late with her kids and probably not getting enough sleep. She drinks mostly coffee and sodas. She is try to cut back on smoking cigarettes. No drugs or alcohol  use.   Review of Epic shows she may have had more seizures than mentioned since last visit 10/2019. She was last seen by rehab 08/2020. She is walking about a mile every day. Still has some right sided weakness and expressive aphasia. BP seems well controlled on amlodipine .   11/04/2019 ALL:  [name] [name]  is a 38 y.o. female here today for follow up for seizures. She continues levetiracetam  1000mg  twice daily. She called with concerns of seizure activity in her sleep in 09/2019 after missing evening dose of levetiracetam . She does admit to occasional missed doses but does not feel this occurs very often. She is feeling well today. She is tolerating medication well.   She continues close follow up with PCP for stroke prevention. She denies any concerns of stroke like symptoms. BP is normal.    Observations/Objective:  Generalized: Well developed, in no acute distress  Mentation: Alert oriented to time, place, history taking. Follows all commands speech and language fluent   Assessment and Plan:  38 y.o. year old female  has a past medical history of Asthma, allergic, Bilateral thoracic back pain (09/13/2015), Eczema, Hypertension, Lactose intolerance, Mature cystic teratoma of ovary, Mood swings (06/01/2015), Seasonal allergies, Seizures (HCC), and Stroke  (HCC) (04/2018). here with    ICD-10-CM   1. Seizure disorder (HCC)  G40.909        [name] [name] is doing well. No seizure activity and no missed doses of ASM since last visit. Last seizure 09/2021. She will continue levetiraetam XR 2000mg  daily. [place] lifestyle habits encouraged. Seizure precautions reviewed. She will follow up with me in 1 year, sooner if needed.    No orders of the defined types were placed in this encounter.   Meds ordered this encounter  Medications   levETIRAcetam  (KEPPRA  XR) 500 MG 24 hr tablet    Sig: Take 4 tablets (2,000 mg total) by mouth daily.    Dispense:  360 tablet    Refill:  3    Supervising Provider:   YAN, YIJUN [[id number]]     Follow Up Instructions:  I discussed the assessment and treatment plan with the patient. The patient was provided an opportunity to ask questions and all were answered. The patient agreed with the plan and demonstrated an understanding of the instructions.   The patient was advised to call back or seek an in-person evaluation if the symptoms worsen or if the condition fails to improve as anticipated.  I provided 15 minutes of non-face-to-face time during this encounter. Patient located at their place of residence during Mychart visit. Provider is in the office.    Deroy Noah, NP

## 2024-05-06 ENCOUNTER — Telehealth: Payer: Medicare HMO | Admitting: Family Medicine

## 2024-05-06 ENCOUNTER — Encounter: Payer: Self-pay | Admitting: Family Medicine

## 2024-05-06 ENCOUNTER — Telehealth (INDEPENDENT_AMBULATORY_CARE_PROVIDER_SITE_OTHER): Admitting: Family Medicine

## 2024-05-06 DIAGNOSIS — G40909 Epilepsy, unspecified, not intractable, without status epilepticus: Secondary | ICD-10-CM | POA: Diagnosis not present

## 2024-05-06 MED ORDER — LEVETIRACETAM ER 500 MG PO TB24: 2000.0000 mg | ORAL_TABLET | Freq: Every day | ORAL | 3 refills | Status: AC

## 2024-05-28 ENCOUNTER — Other Ambulatory Visit: Payer: Self-pay | Admitting: Family Medicine

## 2024-05-28 ENCOUNTER — Telehealth: Payer: Self-pay | Admitting: Family

## 2024-05-28 DIAGNOSIS — L308 Other specified dermatitis: Secondary | ICD-10-CM

## 2024-05-28 DIAGNOSIS — I10 Essential (primary) hypertension: Secondary | ICD-10-CM

## 2024-05-28 NOTE — Telephone Encounter (Signed)
 Left voicemail to give the office a call back to schedule Dupixent reapproval appointment.

## 2024-06-24 NOTE — Progress Notes (Signed)
 Pt attended 02/14/2024 [facility] Appointment. Pt noted at event that he does have a PCP. At event pt did not indicate any SDOH needs. Pt also noted that he is not a smoker and listed Medicaid as his insurance at the event.   During [facility] appt pt was seen due to no immediate availability at her home primary care office and pt was referred to [facility]. Per 02/15/2024 encounter note [facility] attempted to reach pt but was unable to contact her & stated no further attempts to reach the pt will be done. Pt is currently being seen by neurologist & [facility]  for medication management.  Per chart review pt does have a PCP, insurance, and is a smoker. Pt's last appt with PCP was 11/22/2023 and has an upcoming appt on 11/11/2024 and another appt with neurology on 05/25/2025. Pt does not indicate any SDOH needs at this time.  No additional pt f/u to be scheduled at this time per health equity protocol.

## 2024-11-11 ENCOUNTER — Ambulatory Visit

## 2025-05-25 ENCOUNTER — Telehealth: Admitting: Family Medicine
